# Patient Record
Sex: Female | Born: 1950
Health system: Southern US, Community
[De-identification: ages and names within clinical notes are randomized; demographics above are authoritative.]

## PROBLEM LIST (undated history)

## (undated) DIAGNOSIS — F209 Schizophrenia, unspecified: Secondary | ICD-10-CM

## (undated) DIAGNOSIS — Z992 Dependence on renal dialysis: Secondary | ICD-10-CM

## (undated) DIAGNOSIS — N186 End stage renal disease: Secondary | ICD-10-CM

## (undated) DIAGNOSIS — F419 Anxiety disorder, unspecified: Secondary | ICD-10-CM

## (undated) DIAGNOSIS — G629 Polyneuropathy, unspecified: Secondary | ICD-10-CM

## (undated) DIAGNOSIS — F319 Bipolar disorder, unspecified: Secondary | ICD-10-CM

## (undated) HISTORY — PX: CHOLECYSTECTOMY: SHX55

## (undated) HISTORY — PX: HEMICOLECTOMY: SHX854

## (undated) HISTORY — PX: TUBAL LIGATION: SHX77

## (undated) HISTORY — DX: Anxiety disorder, unspecified: F41.9

## (undated) HISTORY — PX: OTHER SURGICAL HISTORY: SHX169

---

## 2010-05-11 ENCOUNTER — Other Ambulatory Visit: Admission: RE | Admit: 2010-05-11 | Discharge: 2010-05-11 | Payer: Self-pay | Admitting: Obstetrics & Gynecology

## 2013-10-22 ENCOUNTER — Encounter: Payer: Self-pay | Admitting: *Deleted

## 2013-10-22 ENCOUNTER — Other Ambulatory Visit: Payer: Self-pay | Admitting: Obstetrics & Gynecology

## 2013-11-09 ENCOUNTER — Other Ambulatory Visit: Payer: Self-pay | Admitting: Obstetrics & Gynecology

## 2013-11-23 ENCOUNTER — Encounter: Payer: Self-pay | Admitting: Obstetrics & Gynecology

## 2013-11-23 ENCOUNTER — Encounter (INDEPENDENT_AMBULATORY_CARE_PROVIDER_SITE_OTHER): Payer: Self-pay

## 2013-11-23 ENCOUNTER — Other Ambulatory Visit (HOSPITAL_COMMUNITY)
Admission: RE | Admit: 2013-11-23 | Discharge: 2013-11-23 | Disposition: A | Discharge status: Home / Self Care | Payer: Medicaid Other | Source: Ambulatory Visit | Attending: Obstetrics & Gynecology | Admitting: Obstetrics & Gynecology

## 2013-11-23 ENCOUNTER — Ambulatory Visit (INDEPENDENT_AMBULATORY_CARE_PROVIDER_SITE_OTHER): Payer: Medicaid Other | Admitting: Obstetrics & Gynecology

## 2013-11-23 VITALS — BP 120/60 | Ht 64.0 in | Wt 143.0 lb

## 2013-11-23 VITALS — BP 100/60 | Ht 64.0 in | Wt 143.0 lb

## 2013-11-23 DIAGNOSIS — Z01419 Encounter for gynecological examination (general) (routine) without abnormal findings: Secondary | ICD-10-CM | POA: Insufficient documentation

## 2013-11-23 DIAGNOSIS — N879 Dysplasia of cervix uteri, unspecified: Secondary | ICD-10-CM

## 2013-11-23 DIAGNOSIS — Z0142 Encounter for cervical smear to confirm findings of recent normal smear following initial abnormal smear: Secondary | ICD-10-CM

## 2013-11-23 DIAGNOSIS — Z1151 Encounter for screening for human papillomavirus (HPV): Secondary | ICD-10-CM | POA: Insufficient documentation

## 2013-11-23 NOTE — Progress Notes (Signed)
Patient ID: Madison [name], female   DOB: [birth year], 63 y.o.   MRN: [id number] Pt seen at [facility] in 05/2013 with Pap reveal ASCUS  HPV not done or at least no result found in her records sent  Pap with HPV done today  Follow up based on that  Past Medical History  Diagnosis Date  . Anxiety     Past Surgical History  Procedure Laterality Date  . Cholecystectomy      OB History   Grav Para Term Preterm Abortions TAB SAB Ect Mult Living                  Not on File  History   Social History  . Marital Status: Unknown    Spouse Name: N/A    Number of Children: N/A  . Years of Education: N/A   Social History Main Topics  . Smoking status: Never Smoker   . Smokeless tobacco: Not on file  . Alcohol Use: Not on file  . Drug Use: Not on file  . Sexual Activity: Not on file   Other Topics Concern  . Not on file   Social History Narrative  . No narrative on file    No family history on file.

## 2013-11-23 NOTE — Addendum Note (Signed)
Addended by: Doyne Keel on: 11/23/2013 12:17 PM   Modules accepted: Orders

## 2013-11-29 NOTE — Progress Notes (Signed)
Patient ID: Madison [name], female   DOB: 06-01-1951, 63 y.o.   MRN: [id number] Pt had ASCUS pa without a HPV evaluation done today   folow up based on that  Past Medical History  Diagnosis Date  . Anxiety     Past Surgical History  Procedure Laterality Date  . Cholecystectomy      OB History   Grav Para Term Preterm Abortions TAB SAB Ect Mult Living                  Not on File  History   Social History  . Marital Status: Unknown    Spouse Name: N/A    Number of Children: N/A  . Years of Education: N/A   Social History Main Topics  . Smoking status: Never Smoker   . Smokeless tobacco: None  . Alcohol Use: None  . Drug Use: None  . Sexual Activity: None   Other Topics Concern  . None   Social History Narrative  . None    History reviewed. No pertinent family history.

## 2013-12-01 ENCOUNTER — Telehealth: Payer: Self-pay | Admitting: Obstetrics & Gynecology

## 2013-12-01 MED ORDER — METRONIDAZOLE 500 MG PO TABS: ORAL_TABLET | ORAL | Status: DC

## 2013-12-01 NOTE — Telephone Encounter (Signed)
Trich on pap, treat with flagyl 2 grams  Recheck in 3 weeks

## 2013-12-01 NOTE — Telephone Encounter (Signed)
I spoke with Lemma, pt's daughter but did not give any test results because no one was listed on HIPPA  Form. Daughter states that pt has Alzheimer's and will not understand what I am telling her. I spoke with Dr. Elonda Husky and he suggested scheduling an appt to discuss test results. Call transferred to front desk to schedule an appt. [place]

## 2013-12-04 ENCOUNTER — Ambulatory Visit: Payer: Medicaid Other | Admitting: Obstetrics & Gynecology

## 2014-12-28 DIAGNOSIS — F259 Schizoaffective disorder, unspecified: Secondary | ICD-10-CM | POA: Diagnosis not present

## 2015-01-24 DIAGNOSIS — F259 Schizoaffective disorder, unspecified: Secondary | ICD-10-CM | POA: Diagnosis not present

## 2015-02-18 DIAGNOSIS — N183 Chronic kidney disease, stage 3 (moderate): Secondary | ICD-10-CM | POA: Diagnosis not present

## 2015-02-18 DIAGNOSIS — Z0000 Encounter for general adult medical examination without abnormal findings: Secondary | ICD-10-CM | POA: Diagnosis not present

## 2015-02-18 DIAGNOSIS — F319 Bipolar disorder, unspecified: Secondary | ICD-10-CM | POA: Diagnosis not present

## 2015-02-22 DIAGNOSIS — F259 Schizoaffective disorder, unspecified: Secondary | ICD-10-CM | POA: Diagnosis not present

## 2015-02-24 DIAGNOSIS — Z780 Asymptomatic menopausal state: Secondary | ICD-10-CM | POA: Diagnosis not present

## 2015-02-24 DIAGNOSIS — Z1231 Encounter for screening mammogram for malignant neoplasm of breast: Secondary | ICD-10-CM | POA: Diagnosis not present

## 2015-03-07 DIAGNOSIS — N184 Chronic kidney disease, stage 4 (severe): Secondary | ICD-10-CM | POA: Diagnosis not present

## 2015-03-07 DIAGNOSIS — N179 Acute kidney failure, unspecified: Secondary | ICD-10-CM | POA: Diagnosis not present

## 2015-03-07 DIAGNOSIS — R809 Proteinuria, unspecified: Secondary | ICD-10-CM | POA: Diagnosis not present

## 2015-03-07 DIAGNOSIS — N183 Chronic kidney disease, stage 3 (moderate): Secondary | ICD-10-CM | POA: Diagnosis not present

## 2015-03-07 DIAGNOSIS — N251 Nephrogenic diabetes insipidus: Secondary | ICD-10-CM | POA: Diagnosis not present

## 2015-03-09 DIAGNOSIS — R928 Other abnormal and inconclusive findings on diagnostic imaging of breast: Secondary | ICD-10-CM | POA: Diagnosis not present

## 2015-03-09 DIAGNOSIS — R922 Inconclusive mammogram: Secondary | ICD-10-CM | POA: Diagnosis not present

## 2015-03-15 DIAGNOSIS — F259 Schizoaffective disorder, unspecified: Secondary | ICD-10-CM | POA: Diagnosis not present

## 2015-04-04 DIAGNOSIS — E559 Vitamin D deficiency, unspecified: Secondary | ICD-10-CM | POA: Diagnosis not present

## 2015-04-04 DIAGNOSIS — S80862A Insect bite (nonvenomous), left lower leg, initial encounter: Secondary | ICD-10-CM | POA: Diagnosis not present

## 2015-04-04 DIAGNOSIS — S80869A Insect bite (nonvenomous), unspecified lower leg, initial encounter: Secondary | ICD-10-CM | POA: Diagnosis not present

## 2015-04-04 DIAGNOSIS — W57XXXA Bitten or stung by nonvenomous insect and other nonvenomous arthropods, initial encounter: Secondary | ICD-10-CM | POA: Diagnosis not present

## 2015-04-04 DIAGNOSIS — N179 Acute kidney failure, unspecified: Secondary | ICD-10-CM | POA: Diagnosis not present

## 2015-04-04 DIAGNOSIS — N251 Nephrogenic diabetes insipidus: Secondary | ICD-10-CM | POA: Diagnosis not present

## 2015-04-04 DIAGNOSIS — N183 Chronic kidney disease, stage 3 (moderate): Secondary | ICD-10-CM | POA: Diagnosis not present

## 2015-04-12 DIAGNOSIS — F259 Schizoaffective disorder, unspecified: Secondary | ICD-10-CM | POA: Diagnosis not present

## 2015-04-18 DIAGNOSIS — M159 Polyosteoarthritis, unspecified: Secondary | ICD-10-CM | POA: Diagnosis not present

## 2015-04-19 ENCOUNTER — Emergency Department: Payer: Medicare Other

## 2015-04-19 ENCOUNTER — Inpatient Hospital Stay
Admission: EM | Admit: 2015-04-19 | Discharge: 2015-04-21 | DRG: 690 | Disposition: A | Discharge status: Home / Self Care | Payer: Medicare Other | Attending: Internal Medicine | Admitting: Internal Medicine

## 2015-04-19 ENCOUNTER — Encounter: Payer: Self-pay | Admitting: *Deleted

## 2015-04-19 ENCOUNTER — Inpatient Hospital Stay: Payer: Medicare Other

## 2015-04-19 DIAGNOSIS — R8290 Unspecified abnormal findings in urine: Secondary | ICD-10-CM | POA: Diagnosis not present

## 2015-04-19 DIAGNOSIS — R4182 Altered mental status, unspecified: Secondary | ICD-10-CM | POA: Diagnosis not present

## 2015-04-19 DIAGNOSIS — N390 Urinary tract infection, site not specified: Secondary | ICD-10-CM | POA: Diagnosis not present

## 2015-04-19 DIAGNOSIS — Z9049 Acquired absence of other specified parts of digestive tract: Secondary | ICD-10-CM | POA: Diagnosis not present

## 2015-04-19 DIAGNOSIS — R531 Weakness: Secondary | ICD-10-CM | POA: Diagnosis not present

## 2015-04-19 DIAGNOSIS — R631 Polydipsia: Secondary | ICD-10-CM | POA: Diagnosis present

## 2015-04-19 DIAGNOSIS — E860 Dehydration: Secondary | ICD-10-CM | POA: Diagnosis not present

## 2015-04-19 DIAGNOSIS — Z79899 Other long term (current) drug therapy: Secondary | ICD-10-CM

## 2015-04-19 DIAGNOSIS — R410 Disorientation, unspecified: Secondary | ICD-10-CM | POA: Diagnosis not present

## 2015-04-19 DIAGNOSIS — E871 Hypo-osmolality and hyponatremia: Secondary | ICD-10-CM | POA: Diagnosis present

## 2015-04-19 DIAGNOSIS — N184 Chronic kidney disease, stage 4 (severe): Secondary | ICD-10-CM | POA: Diagnosis present

## 2015-04-19 DIAGNOSIS — I129 Hypertensive chronic kidney disease with stage 1 through stage 4 chronic kidney disease, or unspecified chronic kidney disease: Secondary | ICD-10-CM | POA: Diagnosis present

## 2015-04-19 DIAGNOSIS — I482 Chronic atrial fibrillation: Secondary | ICD-10-CM | POA: Diagnosis not present

## 2015-04-19 DIAGNOSIS — F419 Anxiety disorder, unspecified: Secondary | ICD-10-CM | POA: Diagnosis present

## 2015-04-19 DIAGNOSIS — F209 Schizophrenia, unspecified: Secondary | ICD-10-CM | POA: Diagnosis present

## 2015-04-19 LAB — COMPREHENSIVE METABOLIC PANEL
ALK PHOS: 56 U/L (ref 38–126)
ALT: 15 U/L (ref 14–54)
ANION GAP: 11 (ref 5–15)
AST: 36 U/L (ref 15–41)
Albumin: 3.6 g/dL (ref 3.5–5.0)
BUN: 24 mg/dL — ABNORMAL HIGH (ref 6–20)
CALCIUM: 8.9 mg/dL (ref 8.9–10.3)
CO2: 22 mmol/L (ref 22–32)
CREATININE: 2.13 mg/dL — AB (ref 0.44–1.00)
Chloride: 81 mmol/L — ABNORMAL LOW (ref 101–111)
GFR calc non Af Amer: 23 mL/min — ABNORMAL LOW (ref >60)
GFR, EST AFRICAN AMERICAN: 27 mL/min — AB (ref >60)
GLUCOSE: 111 mg/dL — AB (ref 65–99)
Potassium: 3.8 mmol/L (ref 3.5–5.1)
Sodium: 114 mmol/L — CL (ref 135–145)
TOTAL PROTEIN: 6.9 g/dL (ref 6.5–8.1)
Total Bilirubin: 0.5 mg/dL (ref 0.3–1.2)

## 2015-04-19 LAB — URINALYSIS COMPLETE WITH MICROSCOPIC (ARMC ONLY)
Bilirubin Urine: NEGATIVE
GLUCOSE, UA: NEGATIVE mg/dL
KETONES UR: NEGATIVE mg/dL
Nitrite: NEGATIVE
Protein, ur: 100 mg/dL — AB
Specific Gravity, Urine: 1.002 — ABNORMAL LOW (ref 1.005–1.030)
pH: 6 (ref 5.0–8.0)

## 2015-04-19 LAB — CBC
HCT: 28.1 % — ABNORMAL LOW (ref 35.0–47.0)
Hemoglobin: 9.9 g/dL — ABNORMAL LOW (ref 12.0–16.0)
MCH: 29.9 pg (ref 26.0–34.0)
MCHC: 35.4 g/dL (ref 32.0–36.0)
MCV: 84.6 fL (ref 80.0–100.0)
Platelets: 200 10*3/uL (ref 150–440)
RBC: 3.32 MIL/uL — ABNORMAL LOW (ref 3.80–5.20)
RDW: 13.5 % (ref 11.5–14.5)
WBC: 5.6 10*3/uL (ref 3.6–11.0)

## 2015-04-19 MED ORDER — GABAPENTIN 100 MG PO CAPS
100.0000 mg | ORAL_CAPSULE | Freq: Three times a day (TID) | ORAL | Status: DC
  Administered 2015-04-20 – 2015-04-21 (×5): 100 mg via ORAL
  Filled 2015-04-19 (×5): qty 1

## 2015-04-19 MED ORDER — OXYCODONE HCL 5 MG PO TABS: 5.0000 mg | ORAL_TABLET | ORAL | Status: DC | PRN

## 2015-04-19 MED ORDER — DOCUSATE SODIUM 100 MG PO CAPS
100.0000 mg | ORAL_CAPSULE | Freq: Two times a day (BID) | ORAL | Status: DC
  Administered 2015-04-20 – 2015-04-21 (×3): 100 mg via ORAL
  Filled 2015-04-19 (×3): qty 1

## 2015-04-19 MED ORDER — SODIUM CHLORIDE 0.9 % IV BOLUS (SEPSIS)
1000.0000 mL | Freq: Once | INTRAVENOUS | Status: AC
  Administered 2015-04-19: 1000 mL via INTRAVENOUS

## 2015-04-19 MED ORDER — ONDANSETRON HCL 4 MG/2ML IJ SOLN: 4.0000 mg | Freq: Four times a day (QID) | INTRAMUSCULAR | Status: DC | PRN

## 2015-04-19 MED ORDER — PANTOPRAZOLE SODIUM 40 MG PO TBEC
40.0000 mg | DELAYED_RELEASE_TABLET | Freq: Every day | ORAL | Status: DC
  Administered 2015-04-20 – 2015-04-21 (×2): 40 mg via ORAL
  Filled 2015-04-19 (×2): qty 1

## 2015-04-19 MED ORDER — DEXTROSE 5 % IV SOLN
1.0000 g | INTRAVENOUS | Status: DC
  Administered 2015-04-20 (×2): 1 g via INTRAVENOUS
  Filled 2015-04-19 (×3): qty 10

## 2015-04-19 MED ORDER — SODIUM CHLORIDE 0.9 % IJ SOLN
3.0000 mL | Freq: Two times a day (BID) | INTRAMUSCULAR | Status: DC
  Administered 2015-04-19 – 2015-04-21 (×3): 3 mL via INTRAVENOUS

## 2015-04-19 MED ORDER — VITAMIN B-1 100 MG PO TABS
50.0000 mg | ORAL_TABLET | Freq: Every day | ORAL | Status: DC
  Administered 2015-04-20 – 2015-04-21 (×2): 50 mg via ORAL
  Filled 2015-04-19 (×2): qty 1

## 2015-04-19 MED ORDER — ACETAMINOPHEN 650 MG RE SUPP: 650.0000 mg | Freq: Four times a day (QID) | RECTAL | Status: DC | PRN

## 2015-04-19 MED ORDER — CLONAZEPAM 0.5 MG PO TABS: 0.5000 mg | ORAL_TABLET | Freq: Two times a day (BID) | ORAL | Status: DC | PRN

## 2015-04-19 MED ORDER — ACETAMINOPHEN 325 MG PO TABS: 650.0000 mg | ORAL_TABLET | Freq: Four times a day (QID) | ORAL | Status: DC | PRN

## 2015-04-19 MED ORDER — SODIUM CHLORIDE 0.9 % IV SOLN
INTRAVENOUS | Status: DC
  Administered 2015-04-20: via INTRAVENOUS

## 2015-04-19 MED ORDER — OLANZAPINE 5 MG PO TABS
15.0000 mg | ORAL_TABLET | Freq: Every day | ORAL | Status: DC
  Administered 2015-04-20 (×2): 15 mg via ORAL
  Filled 2015-04-19: qty 2
  Filled 2015-04-19: qty 3

## 2015-04-19 MED ORDER — HEPARIN SODIUM (PORCINE) 5000 UNIT/ML IJ SOLN
5000.0000 [IU] | Freq: Three times a day (TID) | INTRAMUSCULAR | Status: DC
  Administered 2015-04-20 – 2015-04-21 (×4): 5000 [IU] via SUBCUTANEOUS
  Filled 2015-04-19 (×4): qty 1

## 2015-04-19 MED ORDER — ONDANSETRON HCL 4 MG PO TABS
4.0000 mg | ORAL_TABLET | Freq: Four times a day (QID) | ORAL | Status: DC | PRN
Start: 2015-04-19 — End: 2015-04-21

## 2015-04-19 NOTE — ED Provider Notes (Signed)
[facility] Emergency Department Provider Note  ____________________________________________  Time seen: Approximately 8:11 PM  I have reviewed the triage vital signs and the nursing notes.   HISTORY  Chief Complaint Altered Mental Status    HPI Michaeline Lambright is a 64 y.o. female with schizophrenia, chronic kidney disease, whopresents for evaluation of transient confusion last night, gradual onset. According to patient and family at bedside, when they went to visit the patient, who lives independently, she was confused about where her medications were and did not know whether or not she had taken them. Today, she has not seemed terribly confused but did request to be seen in the emergency department which she usually does not do. She denies any chest pain, difficulty breathing, nausea, vomiting, diarrhea, fevers or chills. No recent falls. Current severity of symptoms is mild. No modifying factors. No headache, no vision change. She reports that her nephrologist told her to "drink a lot of water" and she has been drinking 6 x 2 L bottles of water daily.   Past Medical History  Diagnosis Date  . Anxiety     There are no active problems to display for this patient.   Past Surgical History  Procedure Laterality Date  . Cholecystectomy      Current Outpatient Rx  Name  Route  Sig  Dispense  Refill  . clonazePAM (KLONOPIN) 0.5 MG tablet   Oral   Take 0.5 mg by mouth 2 (two) times daily as needed for anxiety.         . gabapentin (NEURONTIN) 100 MG capsule   Oral   Take 100 mg by mouth 3 (three) times daily.         . Multiple Vitamin (MULTIVITAMIN) tablet   Oral   Take 1 tablet by mouth daily.         Marland Kitchen OLANZapine (ZYPREXA) 10 MG tablet   Oral   Take 15 mg by mouth at bedtime.         . thiamine (VITAMIN B-1) 50 MG tablet   Oral   Take 50 mg by mouth daily.           Allergies Review of patient's allergies indicates no known  allergies.  No family history on file.  Social History History  Substance Use Topics  . Smoking status: Never Smoker   . Smokeless tobacco: Not on file  . Alcohol Use: No    Review of Systems Constitutional: No fever/chills Eyes: No visual changes. ENT: No sore throat. Cardiovascular: Denies chest pain. Respiratory: Denies shortness of breath. Gastrointestinal: No abdominal pain.  No nausea, no vomiting.  No diarrhea.  No constipation. Genitourinary: Negative for dysuria. Musculoskeletal: Negative for back pain. Skin: Negative for rash. Neurological: Negative for headaches, focal weakness or numbness.  10-point ROS otherwise negative.  ____________________________________________   PHYSICAL EXAM:  VITAL SIGNS: ED Triage Vitals  Enc Vitals Group     BP 04/19/15 1930 165/76 mmHg     Pulse Rate 04/19/15 1930 87     Resp 04/19/15 1930 20     Temp 04/19/15 1930 99.6 F (37.6 C)     Temp Source 04/19/15 1930 Oral     SpO2 04/19/15 1930 97 %     Weight 04/19/15 1930 130 lb (58.968 kg)     Height 04/19/15 1930 5\' 4"  (1.626 m)     Head Cir --      Peak Flow --      Pain Score --  Pain Loc --      Pain Edu? --      Excl. in [place]? --     Constitutional: Alert and oriented x 4. Well appearing and in no acute distress. Eyes: Conjunctivae are normal. PERRL. EOMI. Head: Atraumatic. Nose: No congestion/rhinnorhea. Mouth/Throat: Mucous membranes are moist.  Oropharynx non-erythematous. Neck: No stridor.   Cardiovascular: Normal rate, regular rhythm. Grossly normal heart sounds.  Good peripheral circulation. Respiratory: Normal respiratory effort.  No retractions. Lungs CTAB. Gastrointestinal: Soft and nontender. No distention. No abdominal bruits. No CVA tenderness. Genitourinary: deferred Musculoskeletal: No lower extremity tenderness nor edema.  No joint effusions. Neurologic:  Normal speech and language. No gross focal neurologic deficits are appreciated. No gait  instability. 5 out of 5 strength in bilateral upper and lower extremities, sensation intact to light touch throughout, cranial nerves II through XII intact, normal finger-nose-finger. Skin:  Skin is warm, dry and intact. No rash noted. Psychiatric: Mood and affect are normal. Speech and behavior are normal.  ____________________________________________   LABS (all labs ordered are listed, but only abnormal results are displayed)  Labs Reviewed  COMPREHENSIVE METABOLIC PANEL - Abnormal; Notable for the following:    Sodium 114 (*)    Chloride 81 (*)    Glucose, Bld 111 (*)    BUN 24 (*)    Creatinine, Ser 2.13 (*)    GFR calc non Af Amer 23 (*)    GFR calc Af Amer 27 (*)    All other components within normal limits  CBC - Abnormal; Notable for the following:    RBC 3.32 (*)    Hemoglobin 9.9 (*)    HCT 28.1 (*)    All other components within normal limits  URINALYSIS COMPLETEWITH MICROSCOPIC ([facility] ONLY) - Abnormal; Notable for the following:    Color, Urine STRAW (*)    APPearance HAZY (*)    Specific Gravity, Urine 1.002 (*)    Hgb urine dipstick 2+ (*)    Protein, ur 100 (*)    Leukocytes, UA 1+ (*)    Bacteria, UA FEW (*)    Squamous Epithelial / LPF 0-5 (*)    All other components within normal limits  URINALYSIS COMPLETEWITH MICROSCOPIC ([facility] ONLY)  TROPONIN I   ____________________________________________  EKG  ED ECG REPORT I, Joanne Gavel, the attending physician, personally viewed and interpreted this ECG.   Date: 04/19/2015  EKG Time: 20:41  Rate: 80  Rhythm: sinus rhythm  Axis: normal  Intervals:first-degree A-V block   ST&T Change: No acute ST segment elevation, minimal voltage criteria for LVH  ____________________________________________  RADIOLOGY  CXR FINDINGS: The lungs are clear. Heart size is normal. There is no pneumothorax or pleural effusion. No focal bony abnormality.  IMPRESSION: Negative  chest.   ____________________________________________   PROCEDURES  Procedure(s) performed: None  Critical Care performed: Yes, see critical care note(s). Total critical care time spent 30 minutes.  ____________________________________________   INITIAL IMPRESSION / ASSESSMENT AND PLAN / ED COURSE  Pertinent labs & imaging results that were available during my care of the patient were reviewed by me and considered in my medical decision making (see chart for details).  Jameeka Deems is a 64 y.o. female with schizophrenia, chronic kidney disease, whopresents for evaluation of transient confusion last night, gradual onset. On exam, she is well-appearing and in no acute distress. Vital signs stable, she is afebrile. She has an intact neurological examination. No indication for head CT at this time. Labs notable for severe  hyponatremia with sodium of 114 possibly secondary to psychogenic polydipsia- we'll give normal saline. No seizure, she is not currently altered, no indication for hypertonic saline at this time. Additionally, creatinine elevated at 2.13. She is mildly anemic with hemoglobin 9.9 but is hemodynamically stable. Urinalysis is not consistent with infection. Chest x-ray clear. Discussed with hospitalist for admission. ____________________________________________   FINAL CLINICAL IMPRESSION(S) / ED DIAGNOSES  Final diagnoses:  Confusion  Hyponatremia      Joanne Gavel, MD 04/19/15 2134

## 2015-04-19 NOTE — ED Notes (Signed)
Lab reports pt Na 114; charge nurse notified and pt taken to room 19 via w/c

## 2015-04-19 NOTE — H&P (Signed)
[place] [facility] [place] NAME: Madison [name]    MR#:  [id number]  DATE OF BIRTH:  [birth year]  DATE OF ADMISSION:  04/19/2015  PRIMARY CARE PHYSICIAN: No PCP Per Patient   REQUESTING/REFERRING PHYSICIAN: Dr. Edd Fabian  CHIEF COMPLAINT:  Altered mental status  HISTORY OF PRESENT ILLNESS:  Madison [name]  is a 64 y.o. female with a known history of schizophrenia, chronic kidney disease stage IV, chronic atrial fibrillation, hypertension and anxiety is brought into the ED for altered mental status by her sisters. Patient's sister start reporting that she is not being herself and pleasantly confused since yesterday. She got mixed up and not taking her medications properly. She reported to the ED physician that her nephrologist told her to "drink a lot of water" and she has been drinking 6 x 2 L bottles of water daily. Patient was reporting that she was nauseous and vomited 3-4 times since last night. Denies any abdominal pain. Denies any fever or diarrhea. Feeling tired and very slowly responding to questions  PAST MEDICAL HISTORY:   Past Medical History  Diagnosis Date  . Anxiety    schizophrenia Chronic kidney disease stage IV Hypertension  PAST SURGICAL HISTOIRY:   Past Surgical History  Procedure Laterality Date  . Cholecystectomy      SOCIAL HISTORY:   History  Substance Use Topics  . Smoking status: Never Smoker   . Smokeless tobacco: Not on file  . Alcohol Use: No    FAMILY HISTORY:  No family history on file.  DRUG ALLERGIES:  No Known Allergies  REVIEW OF SYSTEMS:  CONSTITUTIONAL: No fever, reporting fatigue and weakness.  EYES: No blurred or double vision.  EARS, NOSE, AND THROAT: No tinnitus or ear pain.  RESPIRATORY: No cough, shortness of breath, wheezing or hemoptysis.  CARDIOVASCULAR: No chest pain, orthopnea, edema.  GASTROINTESTINAL: Admits nausea, vomiting, but no diarrhea or abdominal pain.  GENITOURINARY:  No dysuria, hematuria.  ENDOCRINE: Reports polyuria but no nocturia,  HEMATOLOGY: No anemia, easy bruising or bleeding SKIN: No rash or lesion. MUSCULOSKELETAL: No joint pain or arthritis.   NEUROLOGIC: No tingling, numbness, weakness.  PSYCHIATRY: Has history of anxiety and schizophrenia . Denies depression.   MEDICATIONS AT HOME:   Prior to Admission medications   Medication Sig Start Date End Date Taking? Authorizing Provider  clonazePAM (KLONOPIN) 0.5 MG tablet Take 0.5 mg by mouth 2 (two) times daily as needed for anxiety.   Yes Historical Provider, MD  gabapentin (NEURONTIN) 100 MG capsule Take 100 mg by mouth 3 (three) times daily.   Yes Historical Provider, MD  Multiple Vitamin (MULTIVITAMIN) tablet Take 1 tablet by mouth daily.   Yes Historical Provider, MD  OLANZapine (ZYPREXA) 10 MG tablet Take 15 mg by mouth at bedtime.   Yes Historical Provider, MD  thiamine (VITAMIN B-1) 50 MG tablet Take 50 mg by mouth daily.   Yes Historical Provider, MD      VITAL SIGNS:  Blood pressure 151/70, pulse 87, temperature 98.5 F (36.9 C), temperature source Oral, resp. rate 16, height 5\' 4"  (1.626 m), weight 55.7 kg (122 lb 12.7 oz), SpO2 100 %.  PHYSICAL EXAMINATION:  GENERAL:  64 y.o.-year-old patient lying in the bed with no acute distress.  EYES: Pupils equal, round, reactive to light and accommodation. No scleral icterus. Extraocular muscles intact.  HEENT: Head atraumatic, normocephalic. Oropharynx and nasopharynx clear.  NECK:  Supple, no jugular venous distention. No thyroid enlargement, no tenderness.  LUNGS: Normal breath sounds bilaterally, no wheezing, rales,rhonchi or crepitation. No use of accessory muscles of respiration.  CARDIOVASCULAR: S1, S2 normal. No murmurs, rubs, or gallops.  ABDOMEN: Soft, nontender, nondistended. Bowel sounds present. No organomegaly or mass.  EXTREMITIES: No pedal edema, cyanosis, or clubbing.  NEUROLOGIC: Cranial nerves II through XII are  intact. Muscle strength 5/5 in all extremities. Sensation intact. Gait not checked. Slow response to verbal commands but following verbal commands accurately PSYCHIATRIC: The patient is alert and oriented x 3.  SKIN: No obvious rash, lesion, or ulcer.   LABORATORY PANEL:   CBC  Recent Labs Lab 04/19/15 1938  WBC 5.6  HGB 9.9*  HCT 28.1*  PLT 200   ------------------------------------------------------------------------------------------------------------------  Chemistries   Recent Labs Lab 04/19/15 1938  NA 114*  K 3.8  CL 81*  CO2 22  GLUCOSE 111*  BUN 24*  CREATININE 2.13*  CALCIUM 8.9  AST 36  ALT 15  ALKPHOS 56  BILITOT 0.5   ------------------------------------------------------------------------------------------------------------------  Cardiac Enzymes No results for input(s): TROPONINI in the last 168 hours. ------------------------------------------------------------------------------------------------------------------  RADIOLOGY:  Dg Chest Portable 1 View  04/19/2015   CLINICAL DATA:  Altered mental status.  Weakness.  EXAM: PORTABLE CHEST - 1 VIEW  COMPARISON:  None.  FINDINGS: The lungs are clear. Heart size is normal. There is no pneumothorax or pleural effusion. No focal bony abnormality.  IMPRESSION: Negative chest.   Electronically Signed   By: Inge Rise M.D.   On: 04/19/2015 20:51    EKG:   Orders placed or performed during the hospital encounter of 04/19/15  . ED EKG  . ED EKG    IMPRESSION AND PLAN:   Madison [name]  is a 64 y.o. female with a known history of schizophrenia, chronic kidney disease stage IV, chronic atrial fibrillation, hypertension and anxiety is brought into the ED for altered mental status by her sisters. Patient's sister start reporting that she is not being herself and pleasantly confused since yesterday. She got mixed up and not taking her medications properly. She reported to the ED physician that her  nephrologist told her to "drink a lot of water" and she has been drinking 6 x 2 L bottles of water daily. Patient was reporting that she was nauseous and vomited 3-4 times since last night.    1. Altered mental status from severe hyponatremia 1 L fluid bolus was given in the ED CT head is ordered which is pending Will get neuro checks  2. Severe hypervolemic hyponatremia secondary to psychogenic polydipsia with a complaint of dehydration from nausea and vomiting for the past 2 days  1 L normal saline fluid bolus was given in the ED, will continue normal saline at 60 cc/h -Check serial sodiums every 4 hours and stop IV fluids once her sodium reaches to 122-to reach a target of 8 mEq per hour in 24 hours -Check fasting lipid panel, TSH -Patient takes psych medications for schizophrenia and anxiety which could cause SIADH, but we don't know her baseline sodium -Nephrology consult is placed -Fluid restriction -Neuro checks -Random urine sodium, creatinine and urine osmolality -BMP and CBC in a.m.  3. Abnormal urinalysis and rule out UTI Urine culture and sensitivity is ordered Empiric IV antibiotic Rocephin  4. Chronic kidney disease stage IV Nephrology consult is placed, outpatient follow-up with nephrology as recommended by them  5. Chronic history of atrial fibrillation Rate controlled. Not on any blood thinners.  6. Chronic Hypertension, blood pressure is elevated Not on  any home medications Start her on small dose Lopressor and titrate as needed  Provide GI prophylaxis with Protonix and DVT per flexes with heparin subcutaneous All the records are reviewed and case discussed with ED provider. Management plans discussed with the patient, family and they are in agreement.  CODE STATUS: Full code, sister is the healthcare power of attorney  TOTAL CRITICAL CARE TIME TAKING CARE OF THIS PATIENT: Reviewing medical records, history and physical, admission orders, coordination of care  -50 minutes.    Nicholes Mango M.D on 04/19/2015 at 11:55 PM  Between 7am to 6pm - Pager - [phone]  After 6pm go to [email or web address] - password [email or web address] [facility]  [facility]  Office  [phone]  CC: Primary care physician; No PCP Per Patient

## 2015-04-19 NOTE — ED Notes (Addendum)
Pt ambulatory to triage.  Sister states pt is more confused than usual.  Sx began last night . Pt is schizophrenic per sister.  Pt lives alone.  sister states unsure if pt is taking her medicines.  Pt alert.   Pt denies SI or HI.  Pt denies headache or any pain at this time.

## 2015-04-19 NOTE — ED Notes (Signed)
Patient transported to CT 

## 2015-04-20 ENCOUNTER — Inpatient Hospital Stay: Payer: Medicare Other

## 2015-04-20 DIAGNOSIS — N184 Chronic kidney disease, stage 4 (severe): Secondary | ICD-10-CM | POA: Diagnosis not present

## 2015-04-20 DIAGNOSIS — I482 Chronic atrial fibrillation: Secondary | ICD-10-CM | POA: Diagnosis not present

## 2015-04-20 DIAGNOSIS — R4182 Altered mental status, unspecified: Secondary | ICD-10-CM | POA: Diagnosis not present

## 2015-04-20 DIAGNOSIS — R410 Disorientation, unspecified: Secondary | ICD-10-CM | POA: Diagnosis not present

## 2015-04-20 DIAGNOSIS — F419 Anxiety disorder, unspecified: Secondary | ICD-10-CM | POA: Diagnosis not present

## 2015-04-20 DIAGNOSIS — E871 Hypo-osmolality and hyponatremia: Secondary | ICD-10-CM | POA: Diagnosis not present

## 2015-04-20 DIAGNOSIS — N3000 Acute cystitis without hematuria: Secondary | ICD-10-CM | POA: Diagnosis not present

## 2015-04-20 DIAGNOSIS — I10 Essential (primary) hypertension: Secondary | ICD-10-CM | POA: Diagnosis not present

## 2015-04-20 DIAGNOSIS — N390 Urinary tract infection, site not specified: Secondary | ICD-10-CM | POA: Diagnosis not present

## 2015-04-20 DIAGNOSIS — F209 Schizophrenia, unspecified: Secondary | ICD-10-CM | POA: Diagnosis not present

## 2015-04-20 DIAGNOSIS — E860 Dehydration: Secondary | ICD-10-CM | POA: Diagnosis not present

## 2015-04-20 LAB — URINALYSIS COMPLETE WITH MICROSCOPIC (ARMC ONLY)
BILIRUBIN URINE: NEGATIVE
Glucose, UA: NEGATIVE mg/dL
Ketones, ur: NEGATIVE mg/dL
NITRITE: NEGATIVE
PH: 7 (ref 5.0–8.0)
Protein, ur: 100 mg/dL — AB
Specific Gravity, Urine: 1.001 — ABNORMAL LOW (ref 1.005–1.030)
Squamous Epithelial / LPF: NONE SEEN

## 2015-04-20 LAB — COMPREHENSIVE METABOLIC PANEL
ALK PHOS: 56 U/L (ref 38–126)
ALT: 14 U/L (ref 14–54)
AST: 33 U/L (ref 15–41)
Albumin: 3.1 g/dL — ABNORMAL LOW (ref 3.5–5.0)
Anion gap: 8 (ref 5–15)
BUN: 27 mg/dL — ABNORMAL HIGH (ref 6–20)
CO2: 24 mmol/L (ref 22–32)
Calcium: 9 mg/dL (ref 8.9–10.3)
Chloride: 99 mmol/L — ABNORMAL LOW (ref 101–111)
Creatinine, Ser: 2.15 mg/dL — ABNORMAL HIGH (ref 0.44–1.00)
GFR calc non Af Amer: 23 mL/min — ABNORMAL LOW (ref >60)
GFR, EST AFRICAN AMERICAN: 27 mL/min — AB (ref >60)
GLUCOSE: 94 mg/dL (ref 65–99)
Potassium: 4 mmol/L (ref 3.5–5.1)
Sodium: 131 mmol/L — ABNORMAL LOW (ref 135–145)
Total Bilirubin: 0.3 mg/dL (ref 0.3–1.2)
Total Protein: 6.7 g/dL (ref 6.5–8.1)

## 2015-04-20 LAB — SODIUM
SODIUM: 132 mmol/L — AB (ref 135–145)
Sodium: 126 mmol/L — ABNORMAL LOW (ref 135–145)
Sodium: 131 mmol/L — ABNORMAL LOW (ref 135–145)
Sodium: 137 mmol/L (ref 135–145)
Sodium: 138 mmol/L (ref 135–145)

## 2015-04-20 LAB — OSMOLALITY, URINE: Osmolality, Ur: 70 mOsm/kg — ABNORMAL LOW (ref 300–900)

## 2015-04-20 LAB — SODIUM, URINE, RANDOM: Sodium, Ur: 14 mmol/L

## 2015-04-20 LAB — MRSA PCR SCREENING: MRSA BY PCR: POSITIVE — AB

## 2015-04-20 LAB — TROPONIN I: Troponin I: <0.03 ng/mL (ref <0.031)

## 2015-04-20 LAB — TSH: TSH: 1.46 u[IU]/mL (ref 0.350–4.500)

## 2015-04-20 LAB — CREATININE, URINE, RANDOM: CREATININE, URINE: 10 mg/dL

## 2015-04-20 MED ORDER — METOPROLOL TARTRATE 25 MG PO TABS
25.0000 mg | ORAL_TABLET | Freq: Two times a day (BID) | ORAL | Status: DC
  Administered 2015-04-20 – 2015-04-21 (×3): 25 mg via ORAL
  Filled 2015-04-20 (×4): qty 1

## 2015-04-20 MED ORDER — CHLORHEXIDINE GLUCONATE CLOTH 2 % EX PADS
6.0000 | MEDICATED_PAD | Freq: Every day | CUTANEOUS | Status: DC
Start: 2015-04-20 — End: 2015-04-21

## 2015-04-20 MED ORDER — MUPIROCIN 2 % EX OINT
1.0000 "application " | TOPICAL_OINTMENT | Freq: Two times a day (BID) | CUTANEOUS | Status: DC
  Administered 2015-04-20 (×2): 1 via NASAL
  Filled 2015-04-20 (×2): qty 22

## 2015-04-20 NOTE — Consult Note (Signed)
[facility]  CONSULT NOTE    Date: 04/20/2015                  Patient Name:  Cori Alexie  MRN: [id number]  DOB: 1951-05-08  Age / Sex: 64 y.o., female         PCP: No PCP Per Patient                 Service Requesting Consult: Dr. Margaretmary Eddy                 Reason for Consult: Hyponatremia, Chronic kidney disease stage IV            History of Present Illness: Ms. Adrienna Soelberg is a 64 y.o. black female with schizophrenia, atrial fibrillation, hypertension, anxiety, chronic kidney disease stage IV , who was admitted to [facility] on 04/19/2015 for hyponatremia.  Patient states that she had a nervous breakdown from caregiver fatigue taking care of her mother with dementia. She started to drink a lot of water. Found in ED with sodium of 114. She had not been taking her medications and did not know where they are.  Started on NS and fluid restriction in the ED. Sodium has now improved to 126 to 131. Patient states she is feeling better.  Patient has baseline chronic kidney disease stage IV. She follows with [facility].      Medications: Outpatient medications: Prescriptions prior to admission  Medication Sig Dispense Refill Last Dose  . clonazePAM (KLONOPIN) 0.5 MG tablet Take 0.5 mg by mouth 2 (two) times daily as needed for anxiety.   04/18/2015 at Unknown time  . gabapentin (NEURONTIN) 100 MG capsule Take 100 mg by mouth 3 (three) times daily.   04/18/2015 at Unknown time  . Multiple Vitamin (MULTIVITAMIN) tablet Take 1 tablet by mouth daily.   04/18/2015 at Unknown time  . OLANZapine (ZYPREXA) 10 MG tablet Take 15 mg by mouth at bedtime.   04/18/2015 at Unknown time  . thiamine (VITAMIN B-1) 50 MG tablet Take 50 mg by mouth daily.   04/18/2015 at Unknown time    Current medications: Current Facility-Administered Medications  Medication Dose Route Frequency Provider Last Rate Last Dose  . 0.9 %  sodium chloride infusion   Intravenous Continuous Nicholes Mango,  MD   Stopped at 04/20/15 0330  . acetaminophen (TYLENOL) tablet 650 mg  650 mg Oral Q6H PRN Nicholes Mango, MD       Or  . acetaminophen (TYLENOL) suppository 650 mg  650 mg Rectal Q6H PRN Nicholes Mango, MD      . cefTRIAXone (ROCEPHIN) 1 g in dextrose 5 % 50 mL IVPB  1 g Intravenous Q24H Nicholes Mango, MD   1 g at 04/20/15 0158  . Chlorhexidine Gluconate Cloth 2 % PADS 6 each  6 each Topical Q0600 Aruna Gouru, MD      . clonazePAM (KLONOPIN) tablet 0.5 mg  0.5 mg Oral BID PRN Nicholes Mango, MD      . docusate sodium (COLACE) capsule 100 mg  100 mg Oral BID Nicholes Mango, MD   100 mg at 04/20/15 0028  . gabapentin (NEURONTIN) capsule 100 mg  100 mg Oral TID Nicholes Mango, MD   100 mg at 04/20/15 0027  . heparin injection 5,000 Units  5,000 Units Subcutaneous 3 times per day Nicholes Mango, MD   5,000 Units at 04/20/15 0027  . metoprolol tartrate (LOPRESSOR) tablet 25 mg  25 mg Oral BID Nicholes Mango, MD  25 mg at 04/20/15 0027  . mupirocin ointment (BACTROBAN) 2 % 1 application  1 application Nasal BID Nicholes Mango, MD      . OLANZapine (ZYPREXA) tablet 15 mg  15 mg Oral QHS Nicholes Mango, MD   15 mg at 04/20/15 0028  . ondansetron (ZOFRAN) tablet 4 mg  4 mg Oral Q6H PRN Nicholes Mango, MD       Or  . ondansetron (ZOFRAN) injection 4 mg  4 mg Intravenous Q6H PRN Nicholes Mango, MD      . oxyCODONE (Oxy IR/ROXICODONE) immediate release tablet 5 mg  5 mg Oral Q4H PRN Nicholes Mango, MD      . pantoprazole (PROTONIX) EC tablet 40 mg  40 mg Oral Daily Aruna Gouru, MD      . sodium chloride 0.9 % injection 3 mL  3 mL Intravenous Q12H Nicholes Mango, MD   3 mL at 04/19/15 2345  . thiamine (VITAMIN B-1) tablet 50 mg  50 mg Oral Daily Nicholes Mango, MD          Allergies: No Known Allergies    Past Medical History: Past Medical History  Diagnosis Date  . Anxiety      Past Surgical History: Past Surgical History  Procedure Laterality Date  . Cholecystectomy       Family History: No family history on file.   Social  History: History   Social History  . Marital Status: Single    Spouse Name: N/A  . Number of Children: N/A  . Years of Education: N/A   Occupational History  . Not on file.   Social History Main Topics  . Smoking status: Never Smoker   . Smokeless tobacco: Not on file  . Alcohol Use: No  . Drug Use: Not on file  . Sexual Activity: Not on file   Other Topics Concern  . Not on file   Social History Narrative     Review of Systems: ROS  Vital Signs: Blood pressure 104/62, pulse 61, temperature 97.7 F (36.5 C), temperature source Oral, resp. rate 18, height 5\' 4"  (1.626 m), weight 55.7 kg (122 lb 12.7 oz), SpO2 100 %.  Weight trends: Filed Weights   04/19/15 1930 04/19/15 2332  Weight: 58.968 kg (130 lb) 55.7 kg (122 lb 12.7 oz)    Physical Exam: General: NAD,   Head: Normocephalic, atraumatic. Moist oral mucosal membranes  Eyes: Anicteric, PERRL  Neck: Supple, trachea midline  Lungs:  Clear to auscultation  Heart: Regular rate and rhythm  Abdomen:  Soft, nontender,   Extremities:  no peripheral edema.  Neurologic: Nonfocal, moving all four extremities  Skin: No lesions        Lab results: Basic Metabolic Panel:  Recent Labs Lab 04/19/15 1938 04/20/15 0035 04/20/15 0430  NA 114* 126* 131*  K 3.8  --  4.0  CL 81*  --  99*  CO2 22  --  24  GLUCOSE 111*  --  94  BUN 24*  --  27*  CREATININE 2.13*  --  2.15*  CALCIUM 8.9  --  9.0    Liver Function Tests:  Recent Labs Lab 04/19/15 1938 04/20/15 0430  AST 36 33  ALT 15 14  ALKPHOS 56 56  BILITOT 0.5 0.3  PROT 6.9 6.7  ALBUMIN 3.6 3.1*   No results for input(s): LIPASE, AMYLASE in the last 168 hours. No results for input(s): AMMONIA in the last 168 hours.  CBC:  Recent Labs Lab 04/19/15 1938  WBC 5.6  HGB 9.9*  HCT 28.1*  MCV 84.6  PLT 200    Cardiac Enzymes:  Recent Labs Lab 04/19/15 1938  TROPONINI <0.03    BNP: Invalid input(s): POCBNP  CBG: No results for  input(s): GLUCAP in the last 168 hours.  Microbiology: Results for orders placed or performed during the hospital encounter of 04/19/15  MRSA PCR Screening     Status: Abnormal   Collection Time: 04/20/15 12:13 AM  Result Value Ref Range Status   MRSA by PCR POSITIVE (A) NEGATIVE Final    Comment:        The GeneXpert MRSA Assay (FDA approved for NASAL specimens only), is one component of a comprehensive MRSA colonization surveillance program. It is not intended to diagnose MRSA infection nor to guide or monitor treatment for MRSA infections. CRITICAL RESULT CALLED TO, READ BACK BY AND VERIFIED WITH: CAROL PATEL @ 0142 7.27.16 MPG     Coagulation Studies: No results for input(s): LABPROT, INR in the last 72 hours.  Urinalysis:  Recent Labs  04/19/15 1938 04/20/15 0013  COLORURINE STRAW* COLORLESS*  LABSPEC 1.002* 1.001*  PHURINE 6.0 7.0  GLUCOSEU NEGATIVE NEGATIVE  HGBUR 2+* 1+*  BILIRUBINUR NEGATIVE NEGATIVE  KETONESUR NEGATIVE NEGATIVE  PROTEINUR 100* 100*  NITRITE NEGATIVE NEGATIVE  LEUKOCYTESUR 1+* TRACE*      Imaging: Ct Head Wo Contrast  04/20/2015   CLINICAL DATA:  Altered mental status.  EXAM: CT HEAD WITHOUT CONTRAST  TECHNIQUE: Contiguous axial images were obtained from the base of the skull through the vertex without intravenous contrast.  COMPARISON:  None.  FINDINGS: No cerebral edema, intracranial hemorrhage, mass effect, or midline shift. There is ventriculomegaly. The basilar cisterns are patent. No evidence of territorial infarct. No intracranial fluid collection. Dense well-seen calcifications are present. Calvarium is intact. Included paranasal sinuses and mastoid air cells are well aerated.  IMPRESSION: Ventriculomegaly, may be related central atrophy. No intracranial bleed or acute ischemia.   Electronically Signed   By: Jeb Levering M.D.   On: 04/20/2015 00:27   Ct Head Wo Contrast  04/20/2015   CLINICAL DATA:  Altered mental status.  EXAM:  CT HEAD WITHOUT CONTRAST  TECHNIQUE: Contiguous axial images were obtained from the base of the skull through the vertex without intravenous contrast.  COMPARISON:  None.  FINDINGS: No cerebral edema, intracranial hemorrhage, mass effect, or midline shift. There is ventriculomegaly. The basilar cisterns are patent. No evidence of territorial infarct. No intracranial fluid collection. Dense well-seen calcifications are present. Calvarium is intact. Included paranasal sinuses and mastoid air cells are well aerated.  IMPRESSION: Ventriculomegaly, may be related central atrophy. No intracranial bleed or acute ischemia.   Electronically Signed   By: Jeb Levering M.D.   On: 04/20/2015 00:27   Dg Chest Portable 1 View  04/19/2015   CLINICAL DATA:  Altered mental status.  Weakness.  EXAM: PORTABLE CHEST - 1 VIEW  COMPARISON:  None.  FINDINGS: The lungs are clear. Heart size is normal. There is no pneumothorax or pleural effusion. No focal bony abnormality.  IMPRESSION: Negative chest.   Electronically Signed   By: Inge Rise M.D.   On: 04/19/2015 20:51      Assessment & Plan: Ms. Thessaly Karis is a 64 y.o.  female with schizophrenia, atrial fibrillation, hypertension, anxiety, chronic kidney disease stage IV , who was admitted to [facility] on 04/19/2015 for hyponatremia  1. Hyponatremia: seems to be secondary to primary polydipsia from schizophrenia.  Sodium now improved to  131. Euvolemic on examination - Continue fluid restriction - off IV NS  2. Chronic Kidney Disease stage IV: no known baseline. Patient states that she follows with [facility]. Unclear etiology of chronic kidney disease.  - not currently on an ACE-I/ARB  3. Urinary tract infection: empiric treatment with ceftriaxone.  - pyuria and hematuria.  - pending cultures.   4. Hypertension: better controlled this morning.  - on metoprolol.    LOS: 1 Takia Runyon 7/27/20168:41 AM

## 2015-04-20 NOTE — Progress Notes (Signed)
[place] [facility] [place] NAME: Madison [name]    MR#:  [id number]  DATE OF BIRTH:  13-May-1951  SUBJECTIVE:  CHIEF COMPLAINT:   Chief Complaint  Patient presents with  . Altered Mental Status  Looks close to her baseline now.   REVIEW OF SYSTEMS:  Review of Systems  Constitutional: Negative for fever, weight loss, malaise/fatigue and diaphoresis.  HENT: Negative for ear discharge, ear pain, hearing loss, nosebleeds, sore throat and tinnitus.   Eyes: Negative for blurred vision and pain.  Respiratory: Negative for cough, hemoptysis, shortness of breath and wheezing.   Cardiovascular: Negative for chest pain, palpitations, orthopnea and leg swelling.  Gastrointestinal: Negative for heartburn, nausea, vomiting, abdominal pain, diarrhea, constipation and blood in stool.  Genitourinary: Negative for dysuria, urgency and frequency.  Musculoskeletal: Negative for myalgias and back pain.  Skin: Negative for itching and rash.  Neurological: Negative for dizziness, tingling, tremors, focal weakness, seizures, weakness and headaches.  Psychiatric/Behavioral: Negative for depression. The patient is not nervous/anxious.    DRUG ALLERGIES:  No Known Allergies VITALS:  Blood pressure 121/60, pulse 80, temperature 97.7 F (36.5 C), temperature source Oral, resp. rate 16, height 5\' 4"  (1.626 m), weight 55.7 kg (122 lb 12.7 oz), SpO2 100 %. PHYSICAL EXAMINATION:  Physical Exam  Constitutional: She is oriented to person, place, and time and well-developed, well-nourished, and in no distress.  HENT:  Head: Normocephalic and atraumatic.  Eyes: Conjunctivae and EOM are normal. Pupils are equal, round, and reactive to light.  Neck: Normal range of motion. Neck supple. No tracheal deviation present. No thyromegaly present.  Cardiovascular: Normal rate, regular rhythm and normal heart sounds.   Pulmonary/Chest: Effort normal and breath sounds normal. No  respiratory distress. She has no wheezes. She exhibits no tenderness.  Abdominal: Soft. Bowel sounds are normal. She exhibits no distension. There is no tenderness.  Musculoskeletal: Normal range of motion.  Neurological: She is alert and oriented to person, place, and time. No cranial nerve deficit.  Skin: Skin is warm and dry. No rash noted.  Psychiatric: Mood and affect normal.   LABORATORY PANEL:   CBC  Recent Labs Lab 04/19/15 1938  WBC 5.6  HGB 9.9*  HCT 28.1*  PLT 200   ------------------------------------------------------------------------------------------------------------------ Chemistries   Recent Labs Lab 04/20/15 0430  04/20/15 1617  NA 131*  < > 137  K 4.0  --   --   CL 99*  --   --   CO2 24  --   --   GLUCOSE 94  --   --   BUN 27*  --   --   CREATININE 2.15*  --   --   CALCIUM 9.0  --   --   AST 33  --   --   ALT 14  --   --   ALKPHOS 56  --   --   BILITOT 0.3  --   --   < > = values in this interval not displayed.  ASSESSMENT AND PLAN:  Madison [name] is a 64 y.o. female with a known history of schizophrenia, chronic kidney disease stage IV, chronic atrial fibrillation, hypertension and anxiety is brought into the ED for altered mental status by her sisters. Patient's sister start reporting that she is not being herself and pleasantly confused since yesterday. She got mixed up and not taking her medications properly. She reported to the ED physician that her nephrologist told her to "drink a lot  of water" and she has been drinking 6 x 2 L bottles of water daily. Patient was reporting that she was nauseous and vomited 3-4 times since last night.   1. Altered mental status  1 L fluid bolus was given in the ED CT head is ordered which is neg It seems close to her baseline. This could certainly be multifactorial - from severe hyponatremia, UTI, mix up of her psych meds.  2. Severe hypervolemic hyponatremia secondary to psychogenic polydipsia with a  complaint of dehydration from nausea and vomiting for the past 2 days. Improving and likely at her baseline   3. Abnormal urinalysis and rule out UTI Urine culture and sensitivity is pending  Empiric IV antibiotic Rocephin  4. Chronic kidney disease stage IV Nephrology consult is placed, outpatient follow-up with nephrology as recommended by them  5. Chronic history of atrial fibrillation: Rate controlled. Not on any blood thinners. She had some PR prolongation from 0.22->0.41 but now back down to 0.21. Also having some dropped beats/PACs - keep K close to 4 and mg close to 2.  6. Chronic Hypertension, blood pressure is elevated Not on any home medications continue small dose Lopressor and titrate as needed  All the records are reviewed and case discussed with Care Management/Social Worker. Management plans discussed with the patient, family and they are in agreement.  CODE STATUS: Full Code  TOTAL TIME TAKING CARE OF THIS PATIENT: 35 minutes.   More than 50% of the time was spent in counseling/coordination of care: YES  POSSIBLE D/C IN AM, DEPENDING ON CLINICAL CONDITION.   [facility], Cristyn Crossno M.D on 04/20/2015 at 5:56 PM  Between 7am to 6pm - Pager - [phone]  After 6pm go to [email or web address] - password [email or web address] [facility]  [facility]  Office  [phone]  CC:  Primary care physician; No PCP Per Patient

## 2015-04-20 NOTE — Progress Notes (Signed)
Notified MD about patients increased PR interval, no new orders , will continue to assess

## 2015-04-20 NOTE — Care Management (Signed)
Order present for CM assessment for home health needs.  Patient lives alone and was brought to the ED by her family members for altered mental status.  Patient lives alone and independent in all her adls.  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  Says there is a hired caregiver that stays with her mother for a few hours a day "then I have to do the rest."  She says that she can not think straight because she had not had her morning meds at the time of this interview which occurred around 8:4.  Patient's sodium was 114 upon arrival and is now up to 131.  She does have schizophrenia and there is a possibility that she may not be taking her medications correctly

## 2015-04-20 NOTE — Progress Notes (Signed)
Patient is alert and oriented to self, time, and place however disoriented to situation.  Vitals stable.  Afebrile.  NSR on cardiac monitor.  Up to commode to void, great uop.  MRSA screening positive, standing orders placed per protocol.  Per MD order, pt is to transfer to 2A once her sodium reaches 120 or greater.  Also per MD order, IV fluids are to be stopped once her sodium is 122 or greater.  Repeat serum sodium at 0035 was 126.  Report called to Lexi, RN on 2A.

## 2015-04-21 DIAGNOSIS — I10 Essential (primary) hypertension: Secondary | ICD-10-CM | POA: Diagnosis not present

## 2015-04-21 DIAGNOSIS — N3000 Acute cystitis without hematuria: Secondary | ICD-10-CM | POA: Diagnosis not present

## 2015-04-21 DIAGNOSIS — I482 Chronic atrial fibrillation: Secondary | ICD-10-CM | POA: Diagnosis not present

## 2015-04-21 DIAGNOSIS — R410 Disorientation, unspecified: Secondary | ICD-10-CM | POA: Diagnosis not present

## 2015-04-21 DIAGNOSIS — R4182 Altered mental status, unspecified: Secondary | ICD-10-CM | POA: Diagnosis not present

## 2015-04-21 DIAGNOSIS — N390 Urinary tract infection, site not specified: Secondary | ICD-10-CM | POA: Diagnosis not present

## 2015-04-21 DIAGNOSIS — F419 Anxiety disorder, unspecified: Secondary | ICD-10-CM | POA: Diagnosis not present

## 2015-04-21 DIAGNOSIS — N184 Chronic kidney disease, stage 4 (severe): Secondary | ICD-10-CM | POA: Diagnosis not present

## 2015-04-21 DIAGNOSIS — F209 Schizophrenia, unspecified: Secondary | ICD-10-CM | POA: Diagnosis not present

## 2015-04-21 DIAGNOSIS — E871 Hypo-osmolality and hyponatremia: Secondary | ICD-10-CM | POA: Diagnosis not present

## 2015-04-21 DIAGNOSIS — E860 Dehydration: Secondary | ICD-10-CM | POA: Diagnosis not present

## 2015-04-21 LAB — CBC
HCT: 31.1 % — ABNORMAL LOW (ref 35.0–47.0)
Hemoglobin: 10.5 g/dL — ABNORMAL LOW (ref 12.0–16.0)
MCH: 29.7 pg (ref 26.0–34.0)
MCHC: 33.7 g/dL (ref 32.0–36.0)
MCV: 88.2 fL (ref 80.0–100.0)
Platelets: 236 10*3/uL (ref 150–440)
RBC: 3.52 MIL/uL — AB (ref 3.80–5.20)
RDW: 13.8 % (ref 11.5–14.5)
WBC: 5.6 10*3/uL (ref 3.6–11.0)

## 2015-04-21 LAB — IRON AND TIBC
Iron: 22 ug/dL — ABNORMAL LOW (ref 28–170)
Saturation Ratios: 11 % (ref 10.4–31.8)
TIBC: 203 ug/dL — ABNORMAL LOW (ref 250–450)
UIBC: 181 ug/dL

## 2015-04-21 LAB — RENAL FUNCTION PANEL
ALBUMIN: 3.2 g/dL — AB (ref 3.5–5.0)
Anion gap: 8 (ref 5–15)
BUN: 34 mg/dL — AB (ref 6–20)
CO2: 24 mmol/L (ref 22–32)
Calcium: 9.1 mg/dL (ref 8.9–10.3)
Chloride: 112 mmol/L — ABNORMAL HIGH (ref 101–111)
Creatinine, Ser: 2.47 mg/dL — ABNORMAL HIGH (ref 0.44–1.00)
GFR calc non Af Amer: 19 mL/min — ABNORMAL LOW (ref >60)
GFR, EST AFRICAN AMERICAN: 22 mL/min — AB (ref >60)
Glucose, Bld: 100 mg/dL — ABNORMAL HIGH (ref 65–99)
Phosphorus: 3.2 mg/dL (ref 2.5–4.6)
Potassium: 4 mmol/L (ref 3.5–5.1)
Sodium: 144 mmol/L (ref 135–145)

## 2015-04-21 LAB — SODIUM
Sodium: 141 mmol/L (ref 135–145)
Sodium: 143 mmol/L (ref 135–145)
Sodium: 144 mmol/L (ref 135–145)

## 2015-04-21 LAB — FERRITIN: FERRITIN: 94 ng/mL (ref 11–307)

## 2015-04-21 MED ORDER — CIPROFLOXACIN HCL 250 MG PO TABS: 250.0000 mg | ORAL_TABLET | Freq: Two times a day (BID) | ORAL | Status: DC

## 2015-04-21 NOTE — Discharge Instructions (Signed)
Urinary Tract Infection A urinary tract infection (UTI) can occur any place along the urinary tract. The tract includes the kidneys, ureters, bladder, and urethra. A type of germ called bacteria often causes a UTI. UTIs are often helped with antibiotic medicine.  HOME CARE   If given, take antibiotics as told by your doctor. Finish them even if you start to feel better.  Drink enough fluids to keep your pee (urine) clear or pale yellow.  Avoid tea, drinks with caffeine, and bubbly (carbonated) drinks.  Pee often. Avoid holding your pee in for a long time.  Pee before and after having sex (intercourse).  Wipe from front to back after you poop (bowel movement) if you are a woman. Use each tissue only once. GET HELP RIGHT AWAY IF:   You have back pain.  You have lower belly (abdominal) pain.  You have chills.  You feel sick to your stomach (nauseous).  You throw up (vomit).  Your burning or discomfort with peeing does not go away.  You have a fever.  Your symptoms are not better in 3 days. MAKE SURE YOU:   Understand these instructions.  Will watch your condition.  Will get help right away if you are not doing well or get worse. Document Released: 02/27/2008 Document Revised: 06/04/2012 Document Reviewed: 04/10/2012 [facility] Patient Information 2015 [place], [place]. This information is not intended to replace advice given to you by your health care provider. Make sure you discuss any questions you have with your health care provider.   Hyponatremia  Hyponatremia is when the salt (sodium) in your blood is low. When salt becomes low, your cells take in extra water and puff up (swell). The puffiness can happen in the whole body. It mostly affects the brain and is very serious.  HOME CARE  Only take medicine as told by your doctor.  Follow any diet instructions you were given. This includes limiting how much fluid you drink.  Keep all doctor visits for tests as  told.  Avoid alcohol and drugs. GET HELP RIGHT AWAY IF:  You start to twitch and shake (seize).  You pass out (faint).  You continue to have watery poop (diarrhea) or you throw up (vomit).  You feel sick to your stomach (nauseous).  You are tired (fatigued), have a headache, are confused, or feel weak.  Your problems that first brought you to the doctor come back.  You have trouble following your diet instructions. MAKE SURE YOU:   Understand these instructions.  Will watch your condition.  Will get help right away if you are not doing well or get worse. Document Released: 05/23/2011 Document Revised: 12/03/2011 Document Reviewed: 05/23/2011 [facility] Patient Information 2015 [place], [place]. This information is not intended to replace advice given to you by your health care provider. Make sure you discuss any questions you have with your health care provider.

## 2015-04-21 NOTE — Progress Notes (Signed)
[facility]  ROUNDING NOTE   Subjective:   Patient's sodium is at goal.  She is eating and tolerating PO.  Creatinine at baseline.  Objective:  Vital signs in last 24 hours:  Temp:  [97.7 F (36.5 C)-98.4 F (36.9 C)] 98.4 F (36.9 C) (07/28 0430) Pulse Rate:  [64-80] 76 (07/28 0430) Resp:  [16-21] 21 (07/28 0430) BP: (113-121)/(58-60) 113/58 mmHg (07/28 0430) SpO2:  [100 %] 100 % (07/28 0430)  Weight change:  Filed Weights   04/19/15 1930 04/19/15 2332  Weight: 58.968 kg (130 lb) 55.7 kg (122 lb 12.7 oz)    Intake/Output: I/O last 3 completed shifts: In: 280 [P.O.:50; I.V.:180; IV Piggyback:50] Out: 6050 [Urine:6050]   Intake/Output this shift:     Physical Exam: General: NAD  Head: Normocephalic, atraumatic. Moist oral mucosal membranes  Eyes: Anicteric, PERRL  Neck: Supple, trachea midline  Lungs:  Clear to auscultation  Heart: Regular rate and rhythm  Abdomen:  Soft, nontender,   Extremities: no peripheral edema.  Neurologic: Nonfocal, moving all four extremities  Skin: No lesions  Psych Flat affect    Basic Metabolic Panel:  Recent Labs Lab 04/19/15 1938  04/20/15 0430  04/20/15 1617 04/20/15 2010 04/21/15 0126 04/21/15 0442 04/21/15 0810  NA 114*  < > 131*  < > 137 138 141 143 144  K 3.8  --  4.0  --   --   --   --   --  4.0  CL 81*  --  99*  --   --   --   --   --  112*  CO2 22  --  24  --   --   --   --   --  24  GLUCOSE 111*  --  94  --   --   --   --   --  100*  BUN 24*  --  27*  --   --   --   --   --  34*  CREATININE 2.13*  --  2.15*  --   --   --   --   --  2.47*  CALCIUM 8.9  --  9.0  --   --   --   --   --  9.1  PHOS  --   --   --   --   --   --   --   --  3.2  < > = values in this interval not displayed.  Liver Function Tests:  Recent Labs Lab 04/19/15 1938 04/20/15 0430 04/21/15 0810  AST 36 33  --   ALT 15 14  --   ALKPHOS 56 56  --   BILITOT 0.5 0.3  --   PROT 6.9 6.7  --   ALBUMIN 3.6 3.1* 3.2*   No  results for input(s): LIPASE, AMYLASE in the last 168 hours. No results for input(s): AMMONIA in the last 168 hours.  CBC:  Recent Labs Lab 04/19/15 1938 04/21/15 0810  WBC 5.6 5.6  HGB 9.9* 10.5*  HCT 28.1* 31.1*  MCV 84.6 88.2  PLT 200 236    Cardiac Enzymes:  Recent Labs Lab 04/19/15 1938  TROPONINI <0.03    BNP: Invalid input(s): POCBNP  CBG: No results for input(s): GLUCAP in the last 168 hours.  Microbiology: Results for orders placed or performed during the hospital encounter of 04/19/15  MRSA PCR Screening     Status: Abnormal   Collection Time: 04/20/15 12:13 AM  Result Value Ref Range Status   MRSA by PCR POSITIVE (A) NEGATIVE Final    Comment:        The GeneXpert MRSA Assay (FDA approved for NASAL specimens only), is one component of a comprehensive MRSA colonization surveillance program. It is not intended to diagnose MRSA infection nor to guide or monitor treatment for MRSA infections. CRITICAL RESULT CALLED TO, READ BACK BY AND VERIFIED WITH: CAROL PATEL @ 0142 7.27.16 MPG     Coagulation Studies: No results for input(s): LABPROT, INR in the last 72 hours.  Urinalysis:  Recent Labs  04/19/15 1938 04/20/15 0013  COLORURINE STRAW* COLORLESS*  LABSPEC 1.002* 1.001*  PHURINE 6.0 7.0  GLUCOSEU NEGATIVE NEGATIVE  HGBUR 2+* 1+*  BILIRUBINUR NEGATIVE NEGATIVE  KETONESUR NEGATIVE NEGATIVE  PROTEINUR 100* 100*  NITRITE NEGATIVE NEGATIVE  LEUKOCYTESUR 1+* TRACE*      Imaging: Ct Head Wo Contrast  04/20/2015   CLINICAL DATA:  Altered mental status.  EXAM: CT HEAD WITHOUT CONTRAST  TECHNIQUE: Contiguous axial images were obtained from the base of the skull through the vertex without intravenous contrast.  COMPARISON:  None.  FINDINGS: No cerebral edema, intracranial hemorrhage, mass effect, or midline shift. There is ventriculomegaly. The basilar cisterns are patent. No evidence of territorial infarct. No intracranial fluid collection.  Dense well-seen calcifications are present. Calvarium is intact. Included paranasal sinuses and mastoid air cells are well aerated.  IMPRESSION: Ventriculomegaly, may be related central atrophy. No intracranial bleed or acute ischemia.   Electronically Signed   By: Jeb Levering M.D.   On: 04/20/2015 00:27   Ct Head Wo Contrast  04/20/2015   CLINICAL DATA:  Altered mental status.  EXAM: CT HEAD WITHOUT CONTRAST  TECHNIQUE: Contiguous axial images were obtained from the base of the skull through the vertex without intravenous contrast.  COMPARISON:  None.  FINDINGS: No cerebral edema, intracranial hemorrhage, mass effect, or midline shift. There is ventriculomegaly. The basilar cisterns are patent. No evidence of territorial infarct. No intracranial fluid collection. Dense well-seen calcifications are present. Calvarium is intact. Included paranasal sinuses and mastoid air cells are well aerated.  IMPRESSION: Ventriculomegaly, may be related central atrophy. No intracranial bleed or acute ischemia.   Electronically Signed   By: Jeb Levering M.D.   On: 04/20/2015 00:27   Dg Chest Portable 1 View  04/19/2015   CLINICAL DATA:  Altered mental status.  Weakness.  EXAM: PORTABLE CHEST - 1 VIEW  COMPARISON:  None.  FINDINGS: The lungs are clear. Heart size is normal. There is no pneumothorax or pleural effusion. No focal bony abnormality.  IMPRESSION: Negative chest.   Electronically Signed   By: Inge Rise M.D.   On: 04/19/2015 20:51     Medications:   . sodium chloride Stopped (04/20/15 0330)   . cefTRIAXone (ROCEPHIN)  IV  1 g Intravenous Q24H  . Chlorhexidine Gluconate Cloth  6 each Topical Q0600  . docusate sodium  100 mg Oral BID  . gabapentin  100 mg Oral TID  . heparin  5,000 Units Subcutaneous 3 times per day  . metoprolol tartrate  25 mg Oral BID  . mupirocin ointment  1 application Nasal BID  . OLANZapine  15 mg Oral QHS  . pantoprazole  40 mg Oral Daily  . sodium chloride  3  mL Intravenous Q12H  . thiamine  50 mg Oral Daily   acetaminophen **OR** acetaminophen, clonazePAM, ondansetron **OR** ondansetron (ZOFRAN) IV, oxyCODONE  Assessment/ Plan:  Madison [name] is  a 64 y.o. black  female with schizophrenia, atrial fibrillation, hypertension, anxiety, chronic kidney disease stage IV , who was admitted to [facility] on 04/19/2015 for hyponatremia  1. Hyponatremia: seems to be secondary to primary polydipsia from schizophrenia.  Sodium now improved to 144. Euvolemic on examination - Continue fluid restriction - off IV NS  2. Chronic Kidney Disease stage IV: creatinine of 2.47, eGFr of 22. Patient states that she follows with [facility]. Unclear etiology of chronic kidney disease.  - not currently on an ACE-I/ARB  3. Urinary tract infection: empiric treatment with ceftriaxone. Urine culture pending.   4. Hypertension: better controlled this morning.  - on metoprolol.    LOS: 2 Madison [name] 7/28/20169:30 AM

## 2015-04-21 NOTE — Progress Notes (Signed)
Patient given discharge teaching and paperwork regarding medications, diet, follow-up appointments and activity. Patient understanding verbalized. No complaints at this time. IV and telemetry discontinued before leaving. Skin assessment as previously charted and vitals are stable. Patient being discharged to home. Caregiver/family present during discharge teaching. No further needs by Care Management.

## 2015-04-21 NOTE — Care Management (Signed)
Patient is for discharge today and is oriented.  All verbal responses are appropriate.  She admits to not taking her medications correctly.  Her sister is present and relayed that patient will no longer have the responsibility of being the primary caregiver for her mother.  Patient[facility] [id number] [facility] the [facility].  Says she saw her physician last week.  Patient is agreeable to home health nurse to monitor nutrition, hydration, medications.  Referral to [facility]

## 2015-04-22 LAB — PROTEIN ELECTROPHORESIS, SERUM
A/G RATIO SPE: 0.8 (ref 0.7–1.7)
ALPHA-1-GLOBULIN: 0.3 g/dL (ref 0.0–0.4)
Albumin ELP: 2.8 g/dL — ABNORMAL LOW (ref 2.9–4.4)
Alpha-2-Globulin: 0.8 g/dL (ref 0.4–1.0)
Beta Globulin: 0.9 g/dL (ref 0.7–1.3)
GLOBULIN, TOTAL: 3.4 g/dL (ref 2.2–3.9)
Gamma Globulin: 1.4 g/dL (ref 0.4–1.8)
Total Protein ELP: 6.2 g/dL (ref 6.0–8.5)

## 2015-04-22 LAB — HEPATITIS C ANTIBODY: HCV Ab: <0.1 s/co ratio (ref 0.0–0.9)

## 2015-04-22 LAB — URINE CULTURE

## 2015-04-22 LAB — HEPATITIS B SURFACE ANTIGEN: HEP B S AG: NEGATIVE

## 2015-04-22 LAB — HEPATITIS B CORE ANTIBODY, IGM: Hep B C IgM: NEGATIVE

## 2015-04-22 LAB — HEPATITIS B SURFACE ANTIBODY,QUALITATIVE: Hep B S Ab: REACTIVE

## 2015-04-22 LAB — PARATHYROID HORMONE, INTACT (NO CA): PTH: 99 pg/mL — AB (ref 15–65)

## 2015-04-22 NOTE — Discharge Summary (Signed)
[facility] [place] [facility] NAME: Madison [name]    MR#:  [id number]  DATE OF BIRTH:  12/04/1950  DATE OF ADMISSION:  04/19/2015 ADMITTING PHYSICIAN: Nicholes Mango, MD  DATE OF DISCHARGE: 04/21/2015  1:21 PM  PRIMARY CARE PHYSICIAN: [facility]   ADMISSION DIAGNOSIS:  Confusion [R41.0] Hyponatremia [E87.1] Altered mental status [R41.82] DISCHARGE DIAGNOSIS:  Active Problems:   Altered mental status UTI SECONDARY DIAGNOSIS:   Past Medical History  Diagnosis Date  . Anxiety    HOSPITAL COURSE:  a 64 y.o. female with a known history of schizophrenia, chronic kidney disease stage IV, chronic atrial fibrillation, hypertension and anxiety was admitted for altered mental status. Please see Dr Rinaldo Ratel dictated [place] for further details. This was though to be multifactorial - from severe hyponatremia, UTI, mix up of her psych meds. She was treated for UTI and also given IVF which helped her Hyponatremia and was normalized. She was feeling much better and her mental status was back to baseline.   She is being discharged home in stable condition, she was agreeable with discharge plans. DISCHARGE CONDITIONS:  stable CONSULTS OBTAINED:  Treatment Team:  Nicholes Mango, MD Max Sane, MD Lavonia Dana, MD DRUG ALLERGIES:  No Known Allergies DISCHARGE MEDICATIONS:   Discharge Medication List as of 04/21/2015 11:09 AM    START taking these medications   Details  ciprofloxacin (CIPRO) 250 MG tablet Take 1 tablet (250 mg total) by mouth 2 (two) times daily., Starting 04/21/2015, Until Discontinued, Normal      CONTINUE these medications which have NOT CHANGED   Details  clonazePAM (KLONOPIN) 0.5 MG tablet Take 0.5 mg by mouth 2 (two) times daily as needed for anxiety., Until Discontinued, Historical Med    gabapentin (NEURONTIN) 100 MG capsule Take 100 mg by mouth 3 (three) times daily., Until Discontinued, Historical Med     Multiple Vitamin (MULTIVITAMIN) tablet Take 1 tablet by mouth daily., Until Discontinued, Historical Med    OLANZapine (ZYPREXA) 10 MG tablet Take 15 mg by mouth at bedtime., Until Discontinued, Historical Med    thiamine (VITAMIN B-1) 50 MG tablet Take 50 mg by mouth daily., Until Discontinued, Historical Med       DISCHARGE INSTRUCTIONS:   DIET:  Regular diet DISCHARGE CONDITION:  Good ACTIVITY:  Activity as tolerated OXYGEN:  Home Oxygen: No.  Oxygen Delivery: room air DISCHARGE LOCATION:  home   If you experience worsening of your admission symptoms, develop shortness of breath, life threatening emergency, suicidal or homicidal thoughts you must seek medical attention immediately by calling 911 or calling your MD immediately  if symptoms less severe.  You Must read complete instructions/literature along with all the possible adverse reactions/side effects for all the Medicines you take and that have been prescribed to you. Take any new Medicines after you have completely understood and accpet all the possible adverse reactions/side effects.   Please note  You were cared for by a hospitalist during your hospital stay. If you have any questions about your discharge medications or the care you received while you were in the hospital after you are discharged, you can call the unit and asked to speak with the hospitalist on call if the hospitalist that took care of you is not available. Once you are discharged, your primary care physician will handle any further medical issues. Please note that NO REFILLS for any discharge medications will be authorized once you are discharged,  as it is imperative that you return to your primary care physician (or establish a relationship with a primary care physician if you do not have one) for your aftercare needs so that they can reassess your need for medications and monitor your lab values.   On the day of Discharge: VITAL SIGNS:  Blood  pressure 135/69, pulse 68, temperature 98.4 F (36.9 C), temperature source Oral, resp. rate 21, height 5\' 4"  (1.626 m), weight 55.7 kg (122 lb 12.7 oz), SpO2 100 %. PHYSICAL EXAMINATION:  GENERAL:  64 y.o.-year-old patient lying in the bed with no acute distress.  EYES: Pupils equal, round, reactive to light and accommodation. No scleral icterus. Extraocular muscles intact.  HEENT: Head atraumatic, normocephalic. Oropharynx and nasopharynx clear.  NECK:  Supple, no jugular venous distention. No thyroid enlargement, no tenderness.  LUNGS: Normal breath sounds bilaterally, no wheezing, rales,rhonchi or crepitation. No use of accessory muscles of respiration.  CARDIOVASCULAR: S1, S2 normal. No murmurs, rubs, or gallops.  ABDOMEN: Soft, non-tender, non-distended. Bowel sounds present. No organomegaly or mass.  EXTREMITIES: No pedal edema, cyanosis, or clubbing.  NEUROLOGIC: Cranial nerves II through XII are intact. Muscle strength 5/5 in all extremities. Sensation intact. Gait not checked.  PSYCHIATRIC: The patient is alert and oriented x 3.  SKIN: No obvious rash, lesion, or ulcer.  DATA REVIEW:   CBC  Recent Labs Lab 04/21/15 0810  WBC 5.6  HGB 10.5*  HCT 31.1*  PLT 236    Chemistries   Recent Labs Lab 04/20/15 0430  04/21/15 0810 04/21/15 1255  NA 131*  < > 144 144  K 4.0  --  4.0  --   CL 99*  --  112*  --   CO2 24  --  24  --   GLUCOSE 94  --  100*  --   BUN 27*  --  34*  --   CREATININE 2.15*  --  2.47*  --   CALCIUM 9.0  --  9.1  --   AST 33  --   --   --   ALT 14  --   --   --   ALKPHOS 56  --   --   --   BILITOT 0.3  --   --   --   < > = values in this interval not displayed.  Microbiology Results: Urine c/s grew Strep Agalactie  Management plans discussed with the patient, family and they are in agreement.  CODE STATUS: FULL CODE  TOTAL TIME TAKING CARE OF THIS PATIENT: 55 minutes.    [facility], Samamtha Tiegs M.D on 04/22/2015 at 4:01 PM  Between 7am to 6pm - Pager  - [phone]  After 6pm go to [email or web address] - password [email or web address] [facility]  [facility]  Office  [phone]  CC: Primary care physician; [facility]

## 2015-05-10 DIAGNOSIS — F259 Schizoaffective disorder, unspecified: Secondary | ICD-10-CM | POA: Diagnosis not present

## 2015-05-12 DIAGNOSIS — E782 Mixed hyperlipidemia: Secondary | ICD-10-CM | POA: Diagnosis not present

## 2015-05-12 DIAGNOSIS — N183 Chronic kidney disease, stage 3 (moderate): Secondary | ICD-10-CM | POA: Diagnosis not present

## 2015-05-12 DIAGNOSIS — F319 Bipolar disorder, unspecified: Secondary | ICD-10-CM | POA: Diagnosis not present

## 2015-05-12 DIAGNOSIS — M154 Erosive (osteo)arthritis: Secondary | ICD-10-CM | POA: Diagnosis not present

## 2015-05-12 DIAGNOSIS — E871 Hypo-osmolality and hyponatremia: Secondary | ICD-10-CM | POA: Diagnosis not present

## 2015-05-16 DIAGNOSIS — N183 Chronic kidney disease, stage 3 (moderate): Secondary | ICD-10-CM | POA: Diagnosis not present

## 2015-05-16 DIAGNOSIS — N251 Nephrogenic diabetes insipidus: Secondary | ICD-10-CM | POA: Diagnosis not present

## 2015-05-16 DIAGNOSIS — N179 Acute kidney failure, unspecified: Secondary | ICD-10-CM | POA: Diagnosis not present

## 2015-06-03 DIAGNOSIS — N183 Chronic kidney disease, stage 3 (moderate): Secondary | ICD-10-CM | POA: Diagnosis not present

## 2015-06-03 DIAGNOSIS — F319 Bipolar disorder, unspecified: Secondary | ICD-10-CM | POA: Diagnosis not present

## 2015-06-03 DIAGNOSIS — M154 Erosive (osteo)arthritis: Secondary | ICD-10-CM | POA: Diagnosis not present

## 2015-07-07 DIAGNOSIS — F259 Schizoaffective disorder, unspecified: Secondary | ICD-10-CM | POA: Diagnosis not present

## 2015-08-04 DIAGNOSIS — G3184 Mild cognitive impairment, so stated: Secondary | ICD-10-CM | POA: Diagnosis not present

## 2015-08-15 DIAGNOSIS — E559 Vitamin D deficiency, unspecified: Secondary | ICD-10-CM | POA: Diagnosis not present

## 2015-08-15 DIAGNOSIS — N251 Nephrogenic diabetes insipidus: Secondary | ICD-10-CM | POA: Diagnosis not present

## 2015-08-15 DIAGNOSIS — N183 Chronic kidney disease, stage 3 (moderate): Secondary | ICD-10-CM | POA: Diagnosis not present

## 2015-09-01 DIAGNOSIS — F259 Schizoaffective disorder, unspecified: Secondary | ICD-10-CM | POA: Diagnosis not present

## 2015-09-02 DIAGNOSIS — N183 Chronic kidney disease, stage 3 (moderate): Secondary | ICD-10-CM | POA: Diagnosis not present

## 2015-09-02 DIAGNOSIS — E871 Hypo-osmolality and hyponatremia: Secondary | ICD-10-CM | POA: Diagnosis not present

## 2015-09-02 DIAGNOSIS — M154 Erosive (osteo)arthritis: Secondary | ICD-10-CM | POA: Diagnosis not present

## 2015-09-02 DIAGNOSIS — F319 Bipolar disorder, unspecified: Secondary | ICD-10-CM | POA: Diagnosis not present

## 2015-10-04 DIAGNOSIS — F259 Schizoaffective disorder, unspecified: Secondary | ICD-10-CM | POA: Diagnosis not present

## 2015-10-05 DIAGNOSIS — F259 Schizoaffective disorder, unspecified: Secondary | ICD-10-CM | POA: Diagnosis not present

## 2015-11-08 DIAGNOSIS — F259 Schizoaffective disorder, unspecified: Secondary | ICD-10-CM | POA: Diagnosis not present

## 2015-12-01 DIAGNOSIS — F259 Schizoaffective disorder, unspecified: Secondary | ICD-10-CM | POA: Diagnosis not present

## 2015-12-14 ENCOUNTER — Telehealth: Payer: Self-pay

## 2015-12-14 NOTE — Telephone Encounter (Signed)
Baird Kay is the caregiver of patient and they received a letter to be triaged. Please call [phone]

## 2015-12-14 NOTE — Telephone Encounter (Signed)
LMOM to call.

## 2015-12-19 NOTE — Telephone Encounter (Signed)
I have talked to Baird Kay, the caregiver. She said the pt's last colonoscopy was done in [place]. They think about 10 years ago She said she had some polyps but not cancerous. She will try to get the report. She said the pt has Stage IV kidney failure and sees a psychiatrist monthly. She does not have a list of her meds, but will get those and bring by the office on Thursday after lunch.  PT lives in [place], and caregiver lives in [place].

## 2015-12-22 NOTE — Telephone Encounter (Signed)
Gastroenterology Pre-Procedure Review  Request Date: 12/22/2015 Requesting Physician: Dr. Criss Rosales  PATIENT REVIEW QUESTIONS: The patient responded to the following health history questions as indicated:    Info given by caregiver, Darliss Ridgel Bigelow/ Last colonoscopy years ago in [place] and pt had polyps but not sure what kind She does have stage IV kidney failure  1. Diabetes Melitis: no 2. Joint replacements in the past 12 months: no 3. Major health problems in the past 3 months: no 4. Has an artificial valve or MVP: no 5. Has a defibrillator: no 6. Has been advised in past to take antibiotics in advance of a procedure like teeth cleaning: no 7. Family history of colon cancer: no  8. Alcohol Use: no 9. History of sleep apnea: no     MEDICATIONS & ALLERGIES:    Patient reports the following regarding taking any blood thinners:   Plavix? no Aspirin? no Coumadin? no  Patient confirms/reports the following medications:  Current Outpatient Prescriptions  Medication Sig Dispense Refill  . gabapentin (NEURONTIN) 100 MG capsule Take 100 mg by mouth 3 (three) times daily. Pt takes one tablet in the AM and 2 tablets at bedtime    . Multiple Vitamin (MULTIVITAMIN) tablet Take 1 tablet by mouth daily.    Marland Kitchen OLANZapine (ZYPREXA) 20 MG tablet Take 20 mg by mouth at bedtime.    Marland Kitchen OLANZapine (ZYPREXA) 5 MG tablet Take 5 mg by mouth at bedtime.    . thiamine (VITAMIN B-1) 50 MG tablet Take 50 mg by mouth daily.    . ciprofloxacin (CIPRO) 250 MG tablet Take 1 tablet (250 mg total) by mouth 2 (two) times daily. (Patient not taking: Reported on 12/22/2015) 10 tablet 0  . clonazePAM (KLONOPIN) 0.5 MG tablet Take 0.5 mg by mouth 2 (two) times daily as needed for anxiety. Reported on 12/22/2015     No current facility-administered medications for this visit.    Patient confirms/reports the following allergies:  No Known Allergies  No orders of the defined types were placed in this encounter.     AUTHORIZATION INFORMATION Primary Insurance:   ID #:   Group #:  Pre-Cert / Auth required:  Pre-Cert / Auth #:   Secondary Insurance:   ID #:  Group #:  Pre-Cert / Auth required:  Pre-Cert / Auth #:   SCHEDULE INFORMATION: Procedure has been scheduled as follows:  Date:               Time:   Location:   This Gastroenterology Pre-Precedure Review Form is being routed to the following provider(s): Barney Drain, MD

## 2015-12-22 NOTE — Addendum Note (Signed)
Addended by: Everardo All on: 12/22/2015 02:02 PM   Modules accepted: Orders, Medications

## 2015-12-24 NOTE — Telephone Encounter (Signed)
SUPREP SPLIT DOSING-REGULAR breakfast then CLEAR LIQUIDS after 9 am.

## 2015-12-26 NOTE — Telephone Encounter (Signed)
I spoke to caregiver, Baird Kay and pt is tentatively scheduled for 01/20/2016 at 1:15 pm with Dr. Oneida Alar for the colonoscopy.  [name] will be out of town, but will check and see if one of her sisters can bring her.  She will call me back tomorrow.

## 2015-12-27 NOTE — Telephone Encounter (Signed)
Mrs. Madison [name] left a vm confirming the appt on 01/20/2016. Said she has questions but she will call sometime today between appts that she has.

## 2015-12-28 NOTE — Telephone Encounter (Signed)
Pt has been confirmed for 01/20/2016 at 1:15 PM with Dr. Valinda Hoar. Mrs. Madison [name] said the nephrologist was concerned about prep and said she did not need to use an enema. I told her that Dr. Nona Dell is ordering the Suprep and no enema with that prep. She is going to call the nephrologist and let him know and see if he approves. She will call me back before I mail the info.

## 2015-12-29 NOTE — Telephone Encounter (Signed)
Left Vm that I will send the prep when she gets an answer from the nephrologist about the prep.

## 2016-01-02 NOTE — Telephone Encounter (Signed)
Ms. Cherlyn Cushing left Vm that Nepthrologist said OK to use Go Lytely.

## 2016-01-03 NOTE — Telephone Encounter (Signed)
I called Ms Madison [name] and she said she asked the doctor's nurse if pt could have the suprep and the nurse called her back and said the nephrologist prefers the Go Lytely.   Sending to Dr. Oneida Alar to advise!

## 2016-01-03 NOTE — Telephone Encounter (Signed)
PLEASE CALL PT's sister. We will go with what her kidney doctor recommends(golytely). CLEAR LIQUIDS WITH BREAKFAST.

## 2016-01-05 ENCOUNTER — Other Ambulatory Visit: Payer: Self-pay

## 2016-01-05 ENCOUNTER — Telehealth: Payer: Self-pay | Admitting: Gastroenterology

## 2016-01-05 DIAGNOSIS — Z1211 Encounter for screening for malignant neoplasm of colon: Secondary | ICD-10-CM

## 2016-01-05 DIAGNOSIS — D696 Thrombocytopenia, unspecified: Secondary | ICD-10-CM | POA: Insufficient documentation

## 2016-01-05 NOTE — Telephone Encounter (Signed)
Called [name] @ [phone] and left the message on Vm that pt needs CBC and [place] prior to colonoscopy on 01/20/2016.   Sending to Ginger to schedule the [place].

## 2016-01-05 NOTE — Telephone Encounter (Signed)
I called in the prescription for the Golytely to [place] at [facility]. I have mailed the instructions to pt.

## 2016-01-05 NOTE — Telephone Encounter (Signed)
PLEASE CALL PT'S SISTER. I REVIEWED LABS FROM MAR 30. HER SISTER HAS A LOW PLT CT(130K) WHICH WILL INCREASE RISK OF BLEEDING IF A POLYP IS REMOVED. SHE HAD A  NORMAL PLT COUNT IN [id number]). SHE NEEDS A REPEAT CBC AND ABDOMINAL U/S NEXT WEEK, DX: THROMBOCYTOPENIA.

## 2016-01-09 ENCOUNTER — Other Ambulatory Visit: Payer: Self-pay

## 2016-01-09 DIAGNOSIS — D696 Thrombocytopenia, unspecified: Secondary | ICD-10-CM

## 2016-01-09 NOTE — Telephone Encounter (Signed)
[name] is aware for pt to get CBC and that [name] will call to schedule the [place].

## 2016-01-09 NOTE — Telephone Encounter (Signed)
Pt is set for [place] on 04/19 @ 0915. Called pt and LMOM regarding appt

## 2016-01-11 ENCOUNTER — Ambulatory Visit (HOSPITAL_COMMUNITY)
Admission: RE | Admit: 2016-01-11 | Discharge: 2016-01-11 | Disposition: A | Discharge status: Home / Self Care | Payer: Medicare Other | Source: Ambulatory Visit | Attending: Gastroenterology | Admitting: Gastroenterology

## 2016-01-11 DIAGNOSIS — D696 Thrombocytopenia, unspecified: Secondary | ICD-10-CM

## 2016-01-11 DIAGNOSIS — N281 Cyst of kidney, acquired: Secondary | ICD-10-CM | POA: Insufficient documentation

## 2016-01-11 DIAGNOSIS — Z9049 Acquired absence of other specified parts of digestive tract: Secondary | ICD-10-CM | POA: Insufficient documentation

## 2016-01-11 LAB — CBC WITH DIFFERENTIAL/PLATELET
Basophils Absolute: 37 cells/uL (ref 0–200)
Basophils Relative: 1 %
EOS PCT: 2 %
Eosinophils Absolute: 74 cells/uL (ref 15–500)
HCT: 30.9 % — ABNORMAL LOW (ref 35.0–45.0)
Hemoglobin: 10 g/dL — ABNORMAL LOW (ref 11.7–15.5)
LYMPHS PCT: 33 %
Lymphs Abs: 1221 cells/uL (ref 850–3900)
MCH: 29.7 pg (ref 27.0–33.0)
MCHC: 32.4 g/dL (ref 32.0–36.0)
MCV: 91.7 fL (ref 80.0–100.0)
MONOS PCT: 7 %
MPV: 12.4 fL (ref 7.5–12.5)
Monocytes Absolute: 259 cells/uL (ref 200–950)
NEUTROS PCT: 57 %
Neutro Abs: 2109 cells/uL (ref 1500–7800)
Platelets: 168 10*3/uL (ref 140–400)
RBC: 3.37 MIL/uL — AB (ref 3.80–5.10)
RDW: 14.1 % (ref 11.0–15.0)
WBC: 3.7 10*3/uL — AB (ref 3.8–10.8)

## 2016-01-16 ENCOUNTER — Telehealth: Payer: Self-pay | Admitting: Gastroenterology

## 2016-01-19 ENCOUNTER — Telehealth: Payer: Self-pay

## 2016-01-19 NOTE — Telephone Encounter (Signed)
Pt is calling to make sure that her insurance is aware that she will be having her TCS in the morning. I told her that we have to check with the insurance company to make sure it does not need a PA. She understood

## 2016-01-20 ENCOUNTER — Encounter (HOSPITAL_COMMUNITY)
Admission: RE | Disposition: A | Discharge status: Home Health | Payer: Self-pay | Source: Ambulatory Visit | Attending: Gastroenterology

## 2016-01-20 ENCOUNTER — Encounter (HOSPITAL_COMMUNITY): Payer: Self-pay | Admitting: *Deleted

## 2016-01-20 ENCOUNTER — Ambulatory Visit (HOSPITAL_COMMUNITY)
Admission: RE | Admit: 2016-01-20 | Discharge: 2016-01-20 | Disposition: A | Discharge status: Home Health | Payer: Medicare Other | Source: Ambulatory Visit | Attending: Gastroenterology | Admitting: Gastroenterology

## 2016-01-20 DIAGNOSIS — D123 Benign neoplasm of transverse colon: Secondary | ICD-10-CM | POA: Insufficient documentation

## 2016-01-20 DIAGNOSIS — F419 Anxiety disorder, unspecified: Secondary | ICD-10-CM | POA: Insufficient documentation

## 2016-01-20 DIAGNOSIS — K621 Rectal polyp: Secondary | ICD-10-CM

## 2016-01-20 DIAGNOSIS — Z1211 Encounter for screening for malignant neoplasm of colon: Secondary | ICD-10-CM | POA: Insufficient documentation

## 2016-01-20 DIAGNOSIS — D125 Benign neoplasm of sigmoid colon: Secondary | ICD-10-CM | POA: Insufficient documentation

## 2016-01-20 DIAGNOSIS — K635 Polyp of colon: Secondary | ICD-10-CM

## 2016-01-20 DIAGNOSIS — Z79899 Other long term (current) drug therapy: Secondary | ICD-10-CM | POA: Insufficient documentation

## 2016-01-20 HISTORY — PX: COLONOSCOPY: SHX5424

## 2016-01-20 SURGERY — COLONOSCOPY
Anesthesia: Moderate Sedation

## 2016-01-20 MED ORDER — SODIUM CHLORIDE 0.9 % IV SOLN
INTRAVENOUS | Status: DC
  Administered 2016-01-20: 1000 mL via INTRAVENOUS

## 2016-01-20 MED ORDER — MIDAZOLAM HCL 5 MG/5ML IJ SOLN
INTRAMUSCULAR | Status: DC | PRN
  Administered 2016-01-20: 1 mg via INTRAVENOUS
  Administered 2016-01-20: 2 mg via INTRAVENOUS

## 2016-01-20 MED ORDER — MIDAZOLAM HCL 5 MG/5ML IJ SOLN
INTRAMUSCULAR | Status: AC
  Filled 2016-01-20: qty 10

## 2016-01-20 MED ORDER — STERILE WATER FOR IRRIGATION IR SOLN
Status: DC | PRN
  Administered 2016-01-20: 14:00:00

## 2016-01-20 MED ORDER — MEPERIDINE HCL 100 MG/ML IJ SOLN
INTRAMUSCULAR | Status: AC
  Filled 2016-01-20: qty 2

## 2016-01-20 MED ORDER — MEPERIDINE HCL 100 MG/ML IJ SOLN
INTRAMUSCULAR | Status: DC | PRN
  Administered 2016-01-20: 50 mg via INTRAVENOUS

## 2016-01-20 NOTE — Op Note (Signed)
[facility] Patient Name: Madison [name] Procedure Date: 01/20/2016 1:55 PM MRN: [id number] Date of Birth: Feb 06, 1951 Attending MD: Barney Drain , MD CSN: [id number] Age: 65 Admit Type: Outpatient Procedure:                Colonoscopy Indications:              Screening for colorectal malignant neoplasm Providers:                Barney Drain, MD, Rosina Lowenstein, RN, Georgeann Oppenheim,                            Technician Referring MD:             Almedia Balls Medicines:                Promethazine 25 mg IV, Meperidine 50 mg IV,                            Midazolam 3 mg IV Complications:            No immediate complications. Estimated Blood Loss:     Estimated blood loss was minimal. Procedure:                Pre-Anesthesia Assessment:                           - Prior to the procedure, a History and Physical                            was performed, and patient medications and                            allergies were reviewed. The patient's tolerance of                            previous anesthesia was also reviewed. The risks                            and benefits of the procedure and the sedation                            options and risks were discussed with the patient.                            All questions were answered, and informed consent                            was obtained. Prior Anticoagulants: The patient has                            taken no previous anticoagulant or antiplatelet                            agents. ASA Grade Assessment: II - A patient with  mild systemic disease. After reviewing the risks                            and benefits, the patient was deemed in                            satisfactory condition to undergo the procedure.                           After obtaining informed consent, the colonoscope                            was passed under direct vision. Throughout the                            procedure,  the patient's blood pressure, pulse, and                            oxygen saturations were monitored continuously. The                            [id number] scope was introduced through                            the anus and advanced to the the ileocolonic                            anastomosis. The terminal ileum was photographed.                            The colonoscopy was somewhat difficult due to a                            tortuous colon. Successful completion of the                            procedure was aided by using manual pressure. The                            quality of the bowel preparation was excellent. Scope In: 2:09:54 PM Scope Out: 2:31:37 PM Scope Withdrawal Time: 0 hours 16 minutes 39 seconds  Total Procedure Duration: 0 hours 21 minutes 43 seconds  Findings:      A 12 mm polyp was found in the transverse colon mid transverse colon.       The polyp was sessile. The polyp was removed with a hot snare. Resection       and retrieval were complete.      Four sessile polyps were found in the rectum and sigmoid colon. The       polyps were 3 to 5 mm in size. These polyps were removed with a cold       biopsy forceps. Resection and retrieval were complete.      The neo-terminal ileum appeared normal.      Multiple small and large-mouthed diverticula were found in the sigmoid  colon, descending colon and transverse colon.      Non-bleeding internal hemorrhoids were found. The hemorrhoids were small. Impression:               - One 12 mm polyp in the transverse colon in the                            mid transverse colon, removed with a hot snare.                            Resected and retrieved.                           - Four 3 to 5 mm polyps in the rectum and in the                            sigmoid colon, removed with a cold biopsy forceps.                            Resected and retrieved.                           - The examined portion of the  ileum was normal. Moderate Sedation:      Moderate (conscious) sedation was administered by the endoscopy nurse       and supervised by the endoscopist. The following parameters were       monitored: oxygen saturation, heart rate, blood pressure, and response       to care. Total physician intraservice time was 33 minutes. Recommendation:           - Patient has a contact number available for                            emergencies. The signs and symptoms of potential                            delayed complications were discussed with the                            patient. Return to normal activities tomorrow.                            Written discharge instructions were provided to the                            patient.                           DRINK WATER TO KEEP YOUR URINE LIGHT YELLOW.                           NEEDS MRI OF THE KIDNEY                           - High fiber diet.                           -  Continue present medications.                           - Await pathology results.                           - Repeat colonoscopy in 1-3 year for surveillance. Procedure Code(s):        --- Professional ---                           [phone], Colonoscopy, flexible; with removal of                            tumor(s), polyp(s), or other lesion(s) by snare                            technique                           45380, 59, Colonoscopy, flexible; with biopsy,                            single or multiple                           99153, Moderate sedation services; each additional                            15 minutes intraservice time                           G0500, Moderate sedation services provided by the                            same physician or other qualified health care                            professional performing a gastrointestinal                            endoscopic service that sedation supports,                            requiring the presence of an  independent trained                            observer to assist in the monitoring of the                            patient's level of consciousness and physiological                            status; initial 15 minutes of intra-service time;                            patient age 60 years or older (additional time  may                            be reported with [phone], as appropriate) Diagnosis Code(s):        --- Professional ---                           Z12.11, Encounter for screening for malignant                            neoplasm of colon                           D12.3, Benign neoplasm of transverse colon (hepatic                            flexure or splenic flexure)                           K62.1, Rectal polyp                           D12.5, Benign neoplasm of sigmoid colon CPT copyright 2016 [employer]. All rights reserved. The codes documented in this report are preliminary and upon coder review may  be revised to meet current compliance requirements. Barney Drain, MD Barney Drain, MD 01/20/2016 2:50:00 PM This report has been signed electronically. Number of Addenda: 0

## 2016-01-20 NOTE — H&P (Signed)
  Primary Care Physician:  Elyn Peers, MD Primary Gastroenterologist:  Dr. Oneida Alar  Pre-Procedure History & Physical: HPI:  Madison [name] is a 65 y.o. female here for [place].  Past Medical History  Diagnosis Date  . Anxiety     Past Surgical History  Procedure Laterality Date  . Cholecystectomy    . Tubal ligation      Prior to Admission medications   Medication Sig Start Date End Date Taking? Authorizing Provider  gabapentin (NEURONTIN) 100 MG capsule Take 100-200 mg by mouth 2 (two) times daily. Pt takes one tablet in the AM and 2 tablets at bedtime   Yes Historical Provider, MD  Multiple Vitamin (MULTIVITAMIN) tablet Take 1 tablet by mouth daily.   Yes Historical Provider, MD  OLANZapine (ZYPREXA) 20 MG tablet Take 20 mg by mouth at bedtime.   Yes Historical Provider, MD  OLANZapine (ZYPREXA) 5 MG tablet Take 5 mg by mouth at bedtime.   Yes Historical Provider, MD  thiamine (VITAMIN B-1) 50 MG tablet Take 50 mg by mouth daily.   Yes Historical Provider, MD    Allergies as of 01/05/2016  . (No Known Allergies)    Family History  Problem Relation Age of Onset  . Alzheimer's disease Mother   . Cancer Father   . Kidney failure Sister     Social History   Social History  . Marital Status: Single    Spouse Name: N/A  . Number of Children: N/A  . Years of Education: N/A   Occupational History  . Not on file.   Social History Main Topics  . Smoking status: Never Smoker   . Smokeless tobacco: Not on file  . Alcohol Use: No  . Drug Use: No  . Sexual Activity: Not on file   Other Topics Concern  . Not on file   Social History Narrative    Review of Systems: See HPI, otherwise negative ROS   Physical Exam: BP 156/68 mmHg  Pulse 71  Temp(Src) 98.6 F (37 C) (Oral)  Resp 20  Ht 5\' 4"  (1.626 m)  Wt 132 lb (59.875 kg)  BMI 22.65 kg/m2  SpO2 99% General:   Alert,  pleasant and cooperative in NAD Head:  Normocephalic and  atraumatic. Neck:  Supple; Lungs:  Clear throughout to auscultation.    Heart:  Regular rate and rhythm. Abdomen:  Soft, nontender and nondistended. Normal bowel sounds, without guarding, and without rebound.   Neurologic:  Alert and  oriented x4;  grossly normal neurologically.  Impression/Plan:     SCREENING  Plan:  1. TCS TODAY

## 2016-01-20 NOTE — Discharge Instructions (Signed)
You have small internal hemorrhoids and diverticulosis IN YOUR COLON. YOU HAD FIVE POLYPS REMOVED FROM.   DRINK WATER TO KEEP YOUR URINE LIGHT YELLOW.  FOLLOW A HIGH FIBER DIET. AVOID ITEMS THAT CAUSE BLOATING. See info below.  YOUR BIOPSY RESULTS WILL BE AVAILABLE IN MY CHART MAY 2 AND MY OFFICE WILL CONTACT YOU IN 10-14 DAYS WITH YOUR RESULTS.   Next colonoscopy in 1-3 years.  Colonoscopy Care After Read the instructions outlined below and refer to this sheet in the next week. These discharge instructions provide you with general information on caring for yourself after you leave the hospital. While your treatment has been planned according to the most current medical practices available, unavoidable complications occasionally occur. If you have any problems or questions after discharge, call DR. Mearle Drew, [phone].  ACTIVITY  You may resume your regular activity, but move at a slower pace for the next 24 hours.   Take frequent rest periods for the next 24 hours.   Walking will help get rid of the air and reduce the bloated feeling in your belly (abdomen).   No driving for 24 hours (because of the medicine (anesthesia) used during the test).   You may shower.   Do not sign any important legal documents or operate any machinery for 24 hours (because of the anesthesia used during the test).    NUTRITION  Drink plenty of fluids.   You may resume your normal diet as instructed by your doctor.   Begin with a light meal and progress to your normal diet. Heavy or fried foods are harder to digest and may make you feel sick to your stomach (nauseated).   Avoid alcoholic beverages for 24 hours or as instructed.    MEDICATIONS  You may resume your normal medications.   WHAT YOU CAN EXPECT TODAY  Some feelings of bloating in the abdomen.   Passage of more gas than usual.   Spotting of blood in your stool or on the toilet paper  .  IF YOU HAD POLYPS REMOVED DURING THE  COLONOSCOPY:  Eat a soft diet IF YOU HAVE NAUSEA, BLOATING, ABDOMINAL PAIN, OR VOMITING.    FINDING OUT THE RESULTS OF YOUR TEST Not all test results are available during your visit. DR. Oneida Alar WILL CALL YOU WITHIN 14 DAYS OF YOUR PROCEDUE WITH YOUR RESULTS. Do not assume everything is normal if you have not heard from DR. Kahne Helfand, CALL HER OFFICE AT [phone].  SEEK IMMEDIATE MEDICAL ATTENTION AND CALL THE OFFICE: [phone] IF:  You have more than a spotting of blood in your stool.   Your belly is swollen (abdominal distention).   You are nauseated or vomiting.   You have a temperature over 101F.   You have abdominal pain or discomfort that is severe or gets worse throughout the day.  High-Fiber Diet A high-fiber diet changes your normal diet to include more whole grains, legumes, fruits, and vegetables. Changes in the diet involve replacing refined carbohydrates with unrefined foods. The calorie level of the diet is essentially unchanged. The Dietary Reference Intake (recommended amount) for adult males is 38 grams per day. For adult females, it is 25 grams per day. Pregnant and lactating women should consume 28 grams of fiber per day. Fiber is the intact part of a plant that is not broken down during digestion. Functional fiber is fiber that has been isolated from the plant to provide a beneficial effect in the body.  PURPOSE  Increase stool bulk.  Ease and regulate bowel movements.   Lower cholesterol.  REDUCE RISK OF COLON CANCER  INDICATIONS THAT YOU NEED MORE FIBER  Constipation and hemorrhoids.   Uncomplicated diverticulosis (intestine condition) and irritable bowel syndrome.   Weight management.   As a protective measure against hardening of the arteries (atherosclerosis), diabetes, and cancer.   GUIDELINES FOR INCREASING FIBER IN THE DIET  Start adding fiber to the diet slowly. A gradual increase of about 5 more grams (2 slices of whole-wheat bread, 2  servings of most fruits or vegetables, or 1 bowl of high-fiber cereal) per day is best. Too rapid an increase in fiber may result in constipation, flatulence, and bloating.   Drink enough water and fluids to keep your urine clear or pale yellow. Water, juice, or caffeine-free drinks are recommended. Not drinking enough fluid may cause constipation.   Eat a variety of high-fiber foods rather than one type of fiber.   Try to increase your intake of fiber through using high-fiber foods rather than fiber pills or supplements that contain small amounts of fiber.   The goal is to change the types of food eaten. Do not supplement your present diet with high-fiber foods, but replace foods in your present diet.   INCLUDE A VARIETY OF FIBER SOURCES  Replace refined and processed grains with whole grains, canned fruits with fresh fruits, and incorporate other fiber sources. White rice, white breads, and most bakery goods contain little or no fiber.   Brown whole-grain rice, buckwheat oats, and many fruits and vegetables are all good sources of fiber. These include: broccoli, Brussels sprouts, cabbage, cauliflower, beets, sweet potatoes, white potatoes (skin on), carrots, tomatoes, eggplant, squash, berries, fresh fruits, and dried fruits.   Cereals appear to be the richest source of fiber. Cereal fiber is found in whole grains and bran. Bran is the fiber-rich outer coat of cereal grain, which is largely removed in refining. In whole-grain cereals, the bran remains. In breakfast cereals, the largest amount of fiber is found in those with "bran" in their names. The fiber content is sometimes indicated on the label.   You may need to include additional fruits and vegetables each day.   In baking, for 1 cup white flour, you may use the following substitutions:   1 cup whole-wheat flour minus 2 tablespoons.   1/2 cup white flour plus 1/2 cup whole-wheat flour.   Polyps, Colon  A polyp is extra tissue that  grows inside your body. Colon polyps grow in the large intestine. The large intestine, also called the colon, is part of your digestive system. It is a long, hollow tube at the end of your digestive tract where your body makes and stores stool. Most polyps are not dangerous. They are benign. This means they are not cancerous. But over time, some types of polyps can turn into cancer. Polyps that are smaller than a pea are usually not harmful. But larger polyps could someday become or may already be cancerous. To be safe, doctors remove all polyps and test them.   PREVENTION There is not one sure way to prevent polyps. You might be able to lower your risk of getting them if you:  Eat more fruits and vegetables and less fatty food.   Do not smoke.   Avoid alcohol.   Exercise every day.   Lose weight if you are overweight.   Eating more calcium and folate can also lower your risk of getting polyps. Some foods that are rich in  calcium are milk, cheese, and broccoli. Some foods that are rich in folate are chickpeas, kidney beans, and spinach.    Diverticulosis Diverticulosis is a common condition that develops when small pouches (diverticula) form in the wall of the colon. The risk of diverticulosis increases with age. It happens more often in people who eat a low-fiber diet. Most individuals with diverticulosis have no symptoms. Those individuals with symptoms usually experience belly (abdominal) pain, constipation, or loose stools (diarrhea).  HOME CARE INSTRUCTIONS  Increase the amount of fiber in your diet as directed by your caregiver or dietician. This may reduce symptoms of diverticulosis.   Drink at least 6 to 8 glasses of water each day to prevent constipation.   Try not to strain when you have a bowel movement.   Avoiding nuts and seeds to prevent complications is NOT NECESSARY.   FOODS HAVING HIGH FIBER CONTENT INCLUDE:  Fruits. Apple, peach, pear, tangerine, raisins, prunes.     Vegetables. Brussels sprouts, asparagus, broccoli, cabbage, carrot, cauliflower, romaine lettuce, spinach, summer squash, tomato, winter squash, zucchini.   Starchy Vegetables. Baked beans, kidney beans, lima beans, split peas, lentils, potatoes (with skin).   Grains. Whole wheat bread, brown rice, bran flake cereal, plain oatmeal, white rice, shredded wheat, bran muffins.   SEEK IMMEDIATE MEDICAL CARE IF:  You develop increasing pain or severe bloating.   You have an oral temperature above 101F.   You develop vomiting or bowel movements that are bloody or black.   Hemorrhoids Hemorrhoids are dilated (enlarged) veins around the rectum. Sometimes clots will form in the veins. This makes them swollen and painful. These are called thrombosed hemorrhoids. Causes of hemorrhoids include:  Constipation.   Straining to have a bowel movement.   HEAVY LIFTING  HOME CARE INSTRUCTIONS  Eat a well balanced diet and drink 6 to 8 glasses of water every day to avoid constipation. You may also use a bulk laxative.   Avoid straining to have bowel movements.   Keep anal area dry and clean.   Do not use a donut shaped pillow or sit on the toilet for long periods. This increases blood pooling and pain.   Move your bowels when your body has the urge; this will require less straining and will decrease pain and pressure.

## 2016-01-20 NOTE — Telephone Encounter (Signed)
HER BLOOD COUNT AND PLTS ARE NORMAL. SHE HAS A COMPLEX CYST ON THE  LEFT KIDNEY. SHE NEEDS AN MRI OF HER KIDNEY. WE WILL DISCUSS IT TODAY IN ENDO.

## 2016-01-25 ENCOUNTER — Encounter (HOSPITAL_COMMUNITY): Payer: Self-pay | Admitting: Gastroenterology

## 2016-01-27 ENCOUNTER — Telehealth: Payer: Self-pay | Admitting: Gastroenterology

## 2016-01-27 NOTE — Telephone Encounter (Signed)
Pt had colonoscopy last Friday, she was checking to see if her results were available. [id number]

## 2016-01-31 ENCOUNTER — Encounter: Payer: Self-pay | Admitting: Gastroenterology

## 2016-01-31 NOTE — Telephone Encounter (Signed)
Pt is aware I will call her when Dr. Oneida Alar sends me the report to call.

## 2016-01-31 NOTE — Telephone Encounter (Signed)
Please call pt. She had ONE simple adenoma  AND FOUR HYPERPLASTIC POLYPS REMOVED.    DRINK WATER TO KEEP YOUR URINE LIGHT YELLOW. FOLLOW A HIGH FIBER DIET. AVOID ITEMS THAT CAUSE BLOATING.  Next colonoscopy in 3 years.

## 2016-02-01 NOTE — Telephone Encounter (Signed)
Pt is aware of results. 

## 2016-02-01 NOTE — Telephone Encounter (Signed)
Reminder in epic °

## 2016-04-12 ENCOUNTER — Other Ambulatory Visit (HOSPITAL_COMMUNITY): Payer: Self-pay | Admitting: Family Medicine

## 2016-04-12 ENCOUNTER — Ambulatory Visit (HOSPITAL_COMMUNITY)
Admission: RE | Admit: 2016-04-12 | Discharge: 2016-04-12 | Disposition: A | Discharge status: Home / Self Care | Payer: Medicare Other | Source: Ambulatory Visit | Attending: Family Medicine | Admitting: Family Medicine

## 2016-04-12 DIAGNOSIS — Z09 Encounter for follow-up examination after completed treatment for conditions other than malignant neoplasm: Secondary | ICD-10-CM | POA: Insufficient documentation

## 2016-04-12 DIAGNOSIS — Z8701 Personal history of pneumonia (recurrent): Secondary | ICD-10-CM | POA: Insufficient documentation

## 2016-04-12 DIAGNOSIS — J690 Pneumonitis due to inhalation of food and vomit: Secondary | ICD-10-CM

## 2016-04-12 DIAGNOSIS — J90 Pleural effusion, not elsewhere classified: Secondary | ICD-10-CM | POA: Insufficient documentation

## 2016-04-12 DIAGNOSIS — J189 Pneumonia, unspecified organism: Secondary | ICD-10-CM | POA: Diagnosis present

## 2016-04-15 ENCOUNTER — Emergency Department (HOSPITAL_COMMUNITY): Payer: Medicare Other

## 2016-04-15 ENCOUNTER — Encounter (HOSPITAL_COMMUNITY): Payer: Self-pay | Admitting: Emergency Medicine

## 2016-04-15 ENCOUNTER — Inpatient Hospital Stay (HOSPITAL_COMMUNITY)
Admission: EM | Admit: 2016-04-15 | Discharge: 2016-04-18 | DRG: 640 | Disposition: A | Discharge status: Home Health | Payer: Medicare Other | Attending: Internal Medicine | Admitting: Internal Medicine

## 2016-04-15 DIAGNOSIS — R410 Disorientation, unspecified: Secondary | ICD-10-CM

## 2016-04-15 DIAGNOSIS — I129 Hypertensive chronic kidney disease with stage 1 through stage 4 chronic kidney disease, or unspecified chronic kidney disease: Secondary | ICD-10-CM | POA: Diagnosis present

## 2016-04-15 DIAGNOSIS — N184 Chronic kidney disease, stage 4 (severe): Secondary | ICD-10-CM | POA: Diagnosis present

## 2016-04-15 DIAGNOSIS — Z809 Family history of malignant neoplasm, unspecified: Secondary | ICD-10-CM

## 2016-04-15 DIAGNOSIS — N179 Acute kidney failure, unspecified: Secondary | ICD-10-CM | POA: Diagnosis not present

## 2016-04-15 DIAGNOSIS — D329 Benign neoplasm of meninges, unspecified: Secondary | ICD-10-CM | POA: Diagnosis present

## 2016-04-15 DIAGNOSIS — F23 Brief psychotic disorder: Secondary | ICD-10-CM | POA: Diagnosis present

## 2016-04-15 DIAGNOSIS — D649 Anemia, unspecified: Secondary | ICD-10-CM | POA: Diagnosis present

## 2016-04-15 DIAGNOSIS — E870 Hyperosmolality and hypernatremia: Secondary | ICD-10-CM | POA: Diagnosis not present

## 2016-04-15 DIAGNOSIS — E871 Hypo-osmolality and hyponatremia: Principal | ICD-10-CM | POA: Diagnosis present

## 2016-04-15 DIAGNOSIS — Z8701 Personal history of pneumonia (recurrent): Secondary | ICD-10-CM

## 2016-04-15 DIAGNOSIS — F29 Unspecified psychosis not due to a substance or known physiological condition: Secondary | ICD-10-CM | POA: Diagnosis not present

## 2016-04-15 DIAGNOSIS — Z841 Family history of disorders of kidney and ureter: Secondary | ICD-10-CM

## 2016-04-15 DIAGNOSIS — F209 Schizophrenia, unspecified: Secondary | ICD-10-CM | POA: Diagnosis present

## 2016-04-15 DIAGNOSIS — N183 Chronic kidney disease, stage 3 unspecified: Secondary | ICD-10-CM | POA: Diagnosis present

## 2016-04-15 DIAGNOSIS — Z820 Family history of epilepsy and other diseases of the nervous system: Secondary | ICD-10-CM

## 2016-04-15 DIAGNOSIS — G9340 Encephalopathy, unspecified: Secondary | ICD-10-CM | POA: Diagnosis present

## 2016-04-15 DIAGNOSIS — N289 Disorder of kidney and ureter, unspecified: Secondary | ICD-10-CM

## 2016-04-15 DIAGNOSIS — F419 Anxiety disorder, unspecified: Secondary | ICD-10-CM | POA: Diagnosis present

## 2016-04-15 HISTORY — DX: Schizophrenia, unspecified: F20.9

## 2016-04-15 HISTORY — DX: Polyneuropathy, unspecified: G62.9

## 2016-04-15 LAB — URINALYSIS, ROUTINE W REFLEX MICROSCOPIC
Bilirubin Urine: NEGATIVE
Glucose, UA: NEGATIVE mg/dL
Ketones, ur: NEGATIVE mg/dL
Nitrite: NEGATIVE
Protein, ur: 100 mg/dL — AB
Specific Gravity, Urine: <1.005 — ABNORMAL LOW (ref 1.005–1.030)
pH: 6 (ref 5.0–8.0)

## 2016-04-15 LAB — CBC WITH DIFFERENTIAL/PLATELET
Basophils Absolute: 0 K/uL (ref 0.0–0.1)
Basophils Relative: 0 %
Eosinophils Absolute: 0.1 K/uL (ref 0.0–0.7)
Eosinophils Relative: 2 %
HCT: 29 % — ABNORMAL LOW (ref 36.0–46.0)
Hemoglobin: 10 g/dL — ABNORMAL LOW (ref 12.0–15.0)
Lymphocytes Relative: 22 %
Lymphs Abs: 1.1 K/uL (ref 0.7–4.0)
MCH: 30 pg (ref 26.0–34.0)
MCHC: 34.5 g/dL (ref 30.0–36.0)
MCV: 87.1 fL (ref 78.0–100.0)
Monocytes Absolute: 0.6 K/uL (ref 0.1–1.0)
Monocytes Relative: 12 %
Neutro Abs: 3 K/uL (ref 1.7–7.7)
Neutrophils Relative %: 64 %
Platelets: 194 K/uL (ref 150–400)
RBC: 3.33 MIL/uL — ABNORMAL LOW (ref 3.87–5.11)
RDW: 13.4 % (ref 11.5–15.5)
WBC: 4.7 K/uL (ref 4.0–10.5)

## 2016-04-15 LAB — URINE MICROSCOPIC-ADD ON

## 2016-04-15 LAB — RAPID URINE DRUG SCREEN, HOSP PERFORMED
AMPHETAMINES: NOT DETECTED
BARBITURATES: NOT DETECTED
Benzodiazepines: NOT DETECTED
Cocaine: NOT DETECTED
Opiates: POSITIVE — AB
Tetrahydrocannabinol: NOT DETECTED

## 2016-04-15 LAB — COMPREHENSIVE METABOLIC PANEL WITH GFR
ALT: 13 U/L — ABNORMAL LOW (ref 14–54)
AST: 27 U/L (ref 15–41)
Albumin: 3.4 g/dL — ABNORMAL LOW (ref 3.5–5.0)
Alkaline Phosphatase: 60 U/L (ref 38–126)
Anion gap: 7 (ref 5–15)
BUN: 58 mg/dL — ABNORMAL HIGH (ref 6–20)
CO2: 21 mmol/L — ABNORMAL LOW (ref 22–32)
Calcium: 8.5 mg/dL — ABNORMAL LOW (ref 8.9–10.3)
Chloride: 97 mmol/L — ABNORMAL LOW (ref 101–111)
Creatinine, Ser: 3.02 mg/dL — ABNORMAL HIGH (ref 0.44–1.00)
GFR calc Af Amer: 17 mL/min — ABNORMAL LOW
GFR calc non Af Amer: 15 mL/min — ABNORMAL LOW
Glucose, Bld: 104 mg/dL — ABNORMAL HIGH (ref 65–99)
Potassium: 3.6 mmol/L (ref 3.5–5.1)
Sodium: 125 mmol/L — ABNORMAL LOW (ref 135–145)
Total Bilirubin: 0.6 mg/dL (ref 0.3–1.2)
Total Protein: 6.4 g/dL — ABNORMAL LOW (ref 6.5–8.1)

## 2016-04-15 LAB — ETHANOL

## 2016-04-15 MED ORDER — ADULT MULTIVITAMIN W/MINERALS CH
1.0000 | ORAL_TABLET | Freq: Every day | ORAL | Status: DC
  Administered 2016-04-16 – 2016-04-17 (×2): 1 via ORAL
  Filled 2016-04-15 (×3): qty 1

## 2016-04-15 MED ORDER — SODIUM CHLORIDE 0.9 % IV BOLUS (SEPSIS)
500.0000 mL | Freq: Once | INTRAVENOUS | Status: AC
  Administered 2016-04-15: 500 mL via INTRAVENOUS

## 2016-04-15 MED ORDER — HEPARIN SODIUM (PORCINE) 5000 UNIT/ML IJ SOLN
5000.0000 [IU] | Freq: Three times a day (TID) | INTRAMUSCULAR | Status: DC
  Administered 2016-04-15 – 2016-04-18 (×7): 5000 [IU] via SUBCUTANEOUS
  Filled 2016-04-15 (×8): qty 1

## 2016-04-15 MED ORDER — OLANZAPINE 5 MG PO TABS
20.0000 mg | ORAL_TABLET | Freq: Every day | ORAL | Status: DC
  Administered 2016-04-15 – 2016-04-17 (×3): 20 mg via ORAL
  Filled 2016-04-15 (×3): qty 4

## 2016-04-15 MED ORDER — DEXTROSE-NACL 5-0.9 % IV SOLN
INTRAVENOUS | Status: DC
  Administered 2016-04-15 – 2016-04-16 (×2): via INTRAVENOUS

## 2016-04-15 MED ORDER — VITAMIN B-1 100 MG PO TABS
50.0000 mg | ORAL_TABLET | Freq: Every day | ORAL | Status: DC
  Administered 2016-04-16 – 2016-04-17 (×2): 50 mg via ORAL
  Filled 2016-04-15 (×3): qty 1

## 2016-04-15 MED ORDER — OLANZAPINE 5 MG PO TABS
5.0000 mg | ORAL_TABLET | Freq: Every day | ORAL | Status: DC
  Administered 2016-04-15 – 2016-04-17 (×3): 5 mg via ORAL
  Filled 2016-04-15 (×3): qty 1

## 2016-04-15 NOTE — Plan of Care (Signed)
Problem: Safety: Goal: Ability to remain free from injury will improve Outcome: Progressing Denies falls

## 2016-04-15 NOTE — H&P (Signed)
History and Physical    Madison [name] [id number] DOB: Nov 01, 1950 DOA: 04/15/2016  Referring MD/NP/PA: Pattricia Boss, MD PCP: Elyn Peers, MD  Outpatient Specialists:  Patient coming from:   Chief Complaint: Altered mental status  HPI: Madison [name] is a 65 y.o. female with medical history significant of schizophrenia, renal insuffiency, anxiety disorder, presented with complaints of increasing confusion over the past week. She denies any pain or physical issues. She reports living alone but is walking distance from family and stays with them at night. Noted to have completed a course of abx recently for pneumonia. She was brought into the hospital for increasing confusion.  Her meds were given by her sister, and she admitted to having been inpatient psychiatric hospital for a month previously.  She admitted to having non command auditory hallucination.  No homocidal or suicidal ideation, and no intention to hurt herself.  She denied any substance abuse.   ED Course: Sodium 125; Creatinine 3.02; CT head and CXR unremarkable. Patient started on IVF and referred for admission.   Review of Systems: As per HPI otherwise 10 point review of systems negative.    Past Medical History:  Diagnosis Date  . Anxiety   . Neuropathy ([place])   . Renal disorder   . Schizophrenia [facility])     Past Surgical History:  Procedure Laterality Date  . CHOLECYSTECTOMY    . COLONOSCOPY N/A 01/20/2016   Procedure: COLONOSCOPY;  Surgeon: [name], MD;  [facility];  Service: Endoscopy;  Laterality: N/A;  1:15 PM-moved to 130 Office to notify  . HEMICOLECTOMY Right   . TUBAL LIGATION       reports that she has never smoked. She has never used smokeless tobacco. She reports that she does not drink alcohol or use drugs.  No Known Allergies  Family History  Problem Relation Age of Onset  . Alzheimer's disease Mother   . Cancer Father   . Kidney failure Sister     Prior to Admission  medications   Medication Sig Start Date End Date Taking? Authorizing Provider  furosemide (LASIX) 20 MG tablet Take 20 mg by mouth daily.  03/28/16  Yes Historical Provider, MD  gabapentin (NEURONTIN) 100 MG capsule Take 100-200 mg by mouth 2 (two) times daily. Pt takes one tablet in the AM and 2 tablets at bedtime   Yes Historical Provider, MD  guaiFENesin-codeine (CHERATUSSIN AC) 100-10 MG/5ML syrup Take 5-10 mLs by mouth 3 (three) times daily as needed for cough.   Yes Historical Provider, MD  Multiple Vitamin (MULTIVITAMIN) tablet Take 1 tablet by mouth daily.   Yes Historical Provider, MD  OLANZapine (ZYPREXA) 20 MG tablet Take 20 mg by mouth at bedtime.   Yes Historical Provider, MD  OLANZapine (ZYPREXA) 5 MG tablet Take 5 mg by mouth at bedtime.   Yes Historical Provider, MD  thiamine (VITAMIN B-1) 50 MG tablet Take 50 mg by mouth daily.   Yes Historical Provider, MD  amoxicillin-clavulanate (AUGMENTIN) 500-125 MG tablet Take 1 tablet by mouth 2 (two) times daily. 10 day course starting on 04/02/2016 03/29/16   Historical Provider, MD    Physical Exam: Vitals:   04/15/16 1930 04/15/16 1945 04/15/16 2015 04/15/16 2030  BP: 161/77   162/82  Pulse: 75 74 77   Resp:      Temp:      TempSrc:      SpO2: 98% 99% 99% 100%  Weight:      Height:  Vitals:   04/15/16 1930 04/15/16 1945 04/15/16 2015 04/15/16 2030  BP: 161/77   162/82  Pulse: 75 74 77   Resp:      Temp:      TempSrc:      SpO2: 98% 99% 99% 100%  Weight:      Height:       Constitutional: NAD, calm, comfortable Eyes: PERRL, lids and conjunctivae normal ENMT: Mucous membranes are moist. Posterior pharynx clear of any exudate or lesions.Normal dentition.  Neck: normal, supple, no masses, no thyromegaly Respiratory: clear to auscultation bilaterally, no wheezing, no crackles. Normal respiratory effort. No accessory muscle use.  Cardiovascular: Regular rate and rhythm, no murmurs / rubs / gallops. No extremity  edema. .  Abdomen: no tenderness, no masses palpated. No hepatosplenomegaly. Bowel sounds positive.  Musculoskeletal: no clubbing / cyanosis. No joint deformity upper and lower extremities. Skin: no rashes, lesions, ulcers. No induration Neurologic: CN 2-12 grossly intact. Sensation intact, DTR normal. Strength 5/5 in all 4.  Psychiatric: Mild confusion. Oriented to self and place. Admitted to non command auditory hallucination.     Labs on Admission: I have personally reviewed following labs and imaging studies  CBC:  Recent Labs Lab 04/15/16 1913  WBC 4.7  NEUTROABS 3.0  HGB 10.0*  HCT 29.0*  MCV 87.1  PLT [id number]   Basic Metabolic Panel:  Recent Labs Lab 04/15/16 1913  NA 125*  K 3.6  CL 97*  CO2 21*  GLUCOSE 104*  BUN 58*  CREATININE 3.02*  CALCIUM 8.5*   GFR: Estimated Creatinine Clearance: 15.8 mL/min (by C-G formula based on SCr of 3.02 mg/dL). Liver Function Tests:  Recent Labs Lab 04/15/16 1913  AST 27  ALT 13*  ALKPHOS 60  BILITOT 0.6  PROT 6.4*  ALBUMIN 3.4*   Urine analysis:    Component Value Date/Time   COLORURINE YELLOW 04/15/2016 1752   APPEARANCEUR CLEAR 04/15/2016 1752   LABSPEC <1.005 (L) 04/15/2016 1752   PHURINE 6.0 04/15/2016 1752   GLUCOSEU NEGATIVE 04/15/2016 1752   HGBUR SMALL (A) 04/15/2016 1752   BILIRUBINUR NEGATIVE 04/15/2016 1752   KETONESUR NEGATIVE 04/15/2016 1752   PROTEINUR 100 (A) 04/15/2016 1752   NITRITE NEGATIVE 04/15/2016 1752   LEUKOCYTESUR LARGE (A) 04/15/2016 1752     Radiological Exams on Admission: Dg Chest 2 View  Result Date: 04/15/2016 CLINICAL DATA:  Cough and altered mental status. EXAM: CHEST  2 VIEW COMPARISON:  04/12/2016 and prior radiographs FINDINGS: Upper limits normal heart size noted. There is no evidence of focal airspace disease, pulmonary edema, suspicious pulmonary nodule/mass, pleural effusion, or pneumothorax. No acute bony abnormalities are identified. IMPRESSION: Upper limits of normal  heart size without evidence of active cardiopulmonary disease. Electronically Signed   By: Margarette Canada M.D.   On: 04/15/2016 18:20  Ct Head Wo Contrast  Result Date: 04/15/2016 CLINICAL DATA:  Increased confusion.  History of schizophrenia. EXAM: CT HEAD WITHOUT CONTRAST TECHNIQUE: Contiguous axial images were obtained from the base of the skull through the vertex without intravenous contrast. COMPARISON:  04/18/2009 FINDINGS: Brain: Similar findings of atrophy with centralized volume loss with commensurate ex vacuo dilatation of the ventricular system. Gray-white differentiation is maintained without CT evidence of acute large territory infarct. No intraparenchymal or extra-axial hemorrhage. No midline shift. Re- demonstrated exuberant calcifications about the midline of the falx with a suspected approximately 1.8 x 1.6 x 1.5 cm partially calcified extra-axial lesion about the right-side of the vertex of the falx (axial image 29,  series 2, coronal image 29, series 4, similar to the 03/2015 examination and again favored to represent a meningioma. No associated mass effect. No intraparenchymal mass. Vascular: No hyperdense vessel or unexpected calcification. Skull: No displaced calvarial fracture. Sinuses/Orbits: Underpneumatization the bilateral frontal sinuses. The remaining paranasal sinuses and mastoid air cells are normally aerated. No air-fluid levels. Other: Regional soft tissues appear normal. IMPRESSION: 1. Similar findings of centralized atrophy without acute intracranial process. 2. Similar findings of exuberant calcifications about the midline of the falx with a suspected approximately 1.8 cm meningioma arising from the right side of the vertex of the falx, similar to the 03/2015 examination and without associated mass effect. Electronically Signed   By: Sandi Mariscal M.D.   On: 04/15/2016 19:16   EKG: Independently reviewed.   Assessment/Plan  1. Acute psychosis superimposed on schizophrenia  with noncommand auditory hallucinations. Psych consult requested for the am. Follow-up B-12, and HIV and TSH.  She is not deemed dangerous to herself or others at this time.  2. Hypovolemic hyponatremia, continue IVF for sodium correction.   D/C diuretics.  Will follow Na.   3. AKI. continue IVF.  Follow Cr in the am.  She has no evidence of infection.  Consider renal US if not improving.    DVT prophylaxis: Heparin SQ Code Status: Full Family Communication: No family bedside Disposition Plan: Anticipate discharge in 24-48 hours.  Consults called: None Admission status: Admit to medical bed   Orvan Falconer, MD FACP.  [facility] If 7PM-7AM, please contact night-coverage [email or web address] Password [facility]  04/15/2016, 8:49 PM   By signing my name below, I, Rennis Harding, attest that this documentation has been prepared under the direction and in the presence of Orvan Falconer, MD. Electronically signed: Rennis Harding, Scribe. 04/15/16 8:50pm  I, Dr. Orvan Falconer, personally performed the services described in this documentation. All medical record entries made by the scribe were at my discretion and in my presence. Orvan Falconer, MD 04/15/16

## 2016-04-15 NOTE — ED Notes (Signed)
Patient taken to restroom via wheelchair.

## 2016-04-15 NOTE — ED Notes (Signed)
MD at bedside. 

## 2016-04-15 NOTE — ED Provider Notes (Signed)
[facility] DEPT Provider Note   CSN: [id number] Arrival date & time: 04/15/16  1633  First Provider Contact:  First MD Initiated Contact with Patient 04/15/16 1813        History   Chief Complaint Chief Complaint  Patient presents with  . Altered Mental Status    HPI Madison [name] is a 65 y.o. female.  HPI 65 year old female history of schizophrenia and renal insufficiency who presents today with increasing confusion over the past week. She has had a recent pneumonia and has completed her antibiotic course and is still taking some cough medicine intermittently. She is otherwise reportedly taking all her medicines as prescribed. She does drink a lot of water at baseline although it is hard to tell what she has been drinking recently. She has not had any reported vomiting or diarrhea. The confusion is reported to be generalized without any lateralized weakness or difficulty speaking. She denies any recent head injury. History is obtained initially from the patient but family is later present and abs to history. Past Medical History:  Diagnosis Date  . Anxiety   . Neuropathy ([place])   . Renal disorder   . Schizophrenia [facility])     Patient Active Problem List   Diagnosis Date Noted  . Special screening for malignant neoplasms, colon   . Thrombocytopenia ([place]) 01/05/2016  . Altered mental status 04/19/2015    Past Surgical History:  Procedure Laterality Date  . CHOLECYSTECTOMY    . COLONOSCOPY N/A 01/20/2016   Procedure: COLONOSCOPY;  Surgeon: [name], MD;  Location[facility];  Service: Endoscopy;  Laterality: N/A;  1:15 PM-moved to 130 Office to notify  . HEMICOLECTOMY Right   . TUBAL LIGATION      OB History    No data available       Home Medications    Prior to Admission medications   Medication Sig Start Date End Date Taking? Authorizing Provider  furosemide (LASIX) 20 MG tablet Take 20 mg by mouth daily.  03/28/16  Yes Historical Provider, MD    gabapentin (NEURONTIN) 100 MG capsule Take 100-200 mg by mouth 2 (two) times daily. Pt takes one tablet in the AM and 2 tablets at bedtime   Yes Historical Provider, MD  guaiFENesin-codeine (CHERATUSSIN AC) 100-10 MG/5ML syrup Take 5-10 mLs by mouth 3 (three) times daily as needed for cough.   Yes Historical Provider, MD  Multiple Vitamin (MULTIVITAMIN) tablet Take 1 tablet by mouth daily.   Yes Historical Provider, MD  OLANZapine (ZYPREXA) 20 MG tablet Take 20 mg by mouth at bedtime.   Yes Historical Provider, MD  OLANZapine (ZYPREXA) 5 MG tablet Take 5 mg by mouth at bedtime.   Yes Historical Provider, MD  thiamine (VITAMIN B-1) 50 MG tablet Take 50 mg by mouth daily.   Yes Historical Provider, MD  amoxicillin-clavulanate (AUGMENTIN) 500-125 MG tablet Take 1 tablet by mouth 2 (two) times daily. 10 day course starting on 04/02/2016 03/29/16   Historical Provider, MD    Family History Family History  Problem Relation Age of Onset  . Alzheimer's disease Mother   . Cancer Father   . Kidney failure Sister     Social History Social History  Substance Use Topics  . Smoking status: Never Smoker  . Smokeless tobacco: Never Used  . Alcohol use No     Allergies   Review of patient's allergies indicates no known allergies.   Review of Systems Review of Systems  All other systems reviewed and  are negative.    Physical Exam Updated Vital Signs BP 161/77   Pulse 74   Temp 98.6 F (37 C) (Rectal)   Resp 18   Ht 5\' 4"  (1.626 m)   Wt 59 kg   SpO2 99%   BMI 22.31 kg/m   Physical Exam  Constitutional: She is oriented to person, place, and time. She appears well-developed and well-nourished. No distress.  HENT:  Head: Normocephalic and atraumatic.  Right Ear: External ear normal.  Left Ear: External ear normal.  Nose: Nose normal.  Eyes: Conjunctivae and EOM are normal. Pupils are equal, round, and reactive to light.  Neck: Normal range of motion. Neck supple.  Cardiovascular:  Normal rate.   Pulmonary/Chest: Effort normal and breath sounds normal. No respiratory distress.  Abdominal: Soft. Bowel sounds are normal.  Musculoskeletal: Normal range of motion.  Neurological: She is alert and oriented to person, place, and time. She exhibits normal muscle tone. Coordination normal.  Skin: Skin is warm and dry. Capillary refill takes less than 2 seconds.  Psychiatric: She has a normal mood and affect. Her behavior is normal. Thought content normal.  Nursing note and vitals reviewed.    ED Treatments / Results  Labs (all labs ordered are listed, but only abnormal results are displayed) Labs Reviewed  CBC WITH DIFFERENTIAL/PLATELET - Abnormal; Notable for the following:       Result Value   RBC 3.33 (*)    Hemoglobin 10.0 (*)    HCT 29.0 (*)    All other components within normal limits  COMPREHENSIVE METABOLIC PANEL - Abnormal; Notable for the following:    Sodium 125 (*)    Chloride 97 (*)    CO2 21 (*)    Glucose, Bld 104 (*)    BUN 58 (*)    Creatinine, Ser 3.02 (*)    Calcium 8.5 (*)    Total Protein 6.4 (*)    Albumin 3.4 (*)    ALT 13 (*)    GFR calc non Af Amer 15 (*)    GFR calc Af Amer 17 (*)    All other components within normal limits  URINALYSIS, ROUTINE W REFLEX MICROSCOPIC (NOT AT [facility]) - Abnormal; Notable for the following:    Specific Gravity, Urine <1.005 (*)    Hgb urine dipstick SMALL (*)    Protein, ur 100 (*)    Leukocytes, UA LARGE (*)    All other components within normal limits  URINE RAPID DRUG SCREEN, HOSP PERFORMED - Abnormal; Notable for the following:    Opiates POSITIVE (*)    All other components within normal limits  URINE MICROSCOPIC-ADD ON - Abnormal; Notable for the following:    Squamous Epithelial / LPF 6-30 (*)    Bacteria, UA FEW (*)    All other components within normal limits  ETHANOL    EKG  EKG Interpretation  Date/Time:  Sunday April 15 2016 17:44:24 EDT Ventricular Rate:  76 PR Interval:    QRS  Duration: 102 QT Interval:  403 QTC Calculation: 454 R Axis:   14 Text Interpretation:  Normal sinus rhythm Confirmed by Skylan Lara MD, Yves Fodor ([id number]) on 04/15/2016 8:21:02 PM       Radiology Dg Chest 2 View  Result Date: 04/15/2016 CLINICAL DATA:  Cough and altered mental status. EXAM: CHEST  2 VIEW COMPARISON:  04/12/2016 and prior radiographs FINDINGS: Upper limits normal heart size noted. There is no evidence of focal airspace disease, pulmonary edema, suspicious pulmonary nodule/mass, pleural effusion,  or pneumothorax. No acute bony abnormalities are identified. IMPRESSION: Upper limits of normal heart size without evidence of active cardiopulmonary disease. Electronically Signed   By: Margarette Canada M.D.   On: 04/15/2016 18:20  Ct Head Wo Contrast  Result Date: 04/15/2016 CLINICAL DATA:  Increased confusion.  History of schizophrenia. EXAM: CT HEAD WITHOUT CONTRAST TECHNIQUE: Contiguous axial images were obtained from the base of the skull through the vertex without intravenous contrast. COMPARISON:  04/18/2009 FINDINGS: Brain: Similar findings of atrophy with centralized volume loss with commensurate ex vacuo dilatation of the ventricular system. Gray-white differentiation is maintained without CT evidence of acute large territory infarct. No intraparenchymal or extra-axial hemorrhage. No midline shift. Re- demonstrated exuberant calcifications about the midline of the falx with a suspected approximately 1.8 x 1.6 x 1.5 cm partially calcified extra-axial lesion about the right-side of the vertex of the falx (axial image 29, series 2, coronal image 29, series 4, similar to the 03/2015 examination and again favored to represent a meningioma. No associated mass effect. No intraparenchymal mass. Vascular: No hyperdense vessel or unexpected calcification. Skull: No displaced calvarial fracture. Sinuses/Orbits: Underpneumatization the bilateral frontal sinuses. The remaining paranasal sinuses and mastoid air  cells are normally aerated. No air-fluid levels. Other: Regional soft tissues appear normal. IMPRESSION: 1. Similar findings of centralized atrophy without acute intracranial process. 2. Similar findings of exuberant calcifications about the midline of the falx with a suspected approximately 1.8 cm meningioma arising from the right side of the vertex of the falx, similar to the 03/2015 examination and without associated mass effect. Electronically Signed   By: Sandi Mariscal M.D.   On: 04/15/2016 19:16   Procedures Procedures (including critical care time)  Medications Ordered in ED Medications  sodium chloride 0.9 % bolus 500 mL (not administered)     Initial Impression / Assessment and Plan / ED Course  I have reviewed the triage vital signs and the nursing notes.  Pertinent labs & imaging results that were available during my care of the patient were reviewed by me and considered in my medical decision making (see chart for details).  Clinical Course    Patient presents today with increased confusion and chronic hyponatremia and increasing renal insufficiency. Plan admission for sodium correction and iv hydration.Unclear chronicity but does not appear acute although last sodium  144 ( but one year ago).    Final Clinical Impressions(s) / ED Diagnoses   Final diagnoses:  Confusion  Hyponatremia  Renal insufficiency    New Prescriptions New Prescriptions   No medications on file     Pattricia Boss, MD 04/15/16 2109

## 2016-04-15 NOTE — ED Triage Notes (Signed)
Pt c/o feeling increasingly confused. Pt sister states has to be hospitalized for psychiatric problems recently but also has kidney problems. Pt denies SI/HI, but states she "feels like it is time for her to go back into the hospital".

## 2016-04-16 ENCOUNTER — Encounter (HOSPITAL_COMMUNITY): Payer: Self-pay | Admitting: Internal Medicine

## 2016-04-16 DIAGNOSIS — N183 Chronic kidney disease, stage 3 unspecified: Secondary | ICD-10-CM | POA: Diagnosis present

## 2016-04-16 DIAGNOSIS — D6489 Other specified anemias: Secondary | ICD-10-CM | POA: Diagnosis not present

## 2016-04-16 DIAGNOSIS — D329 Benign neoplasm of meninges, unspecified: Secondary | ICD-10-CM | POA: Diagnosis present

## 2016-04-16 DIAGNOSIS — D649 Anemia, unspecified: Secondary | ICD-10-CM | POA: Diagnosis present

## 2016-04-16 DIAGNOSIS — F29 Unspecified psychosis not due to a substance or known physiological condition: Secondary | ICD-10-CM | POA: Diagnosis not present

## 2016-04-16 DIAGNOSIS — R410 Disorientation, unspecified: Secondary | ICD-10-CM | POA: Diagnosis not present

## 2016-04-16 DIAGNOSIS — E871 Hypo-osmolality and hyponatremia: Secondary | ICD-10-CM | POA: Diagnosis not present

## 2016-04-16 DIAGNOSIS — N179 Acute kidney failure, unspecified: Secondary | ICD-10-CM | POA: Diagnosis not present

## 2016-04-16 LAB — COMPREHENSIVE METABOLIC PANEL
ALK PHOS: 50 U/L (ref 38–126)
ALT: 11 U/L — ABNORMAL LOW (ref 14–54)
ANION GAP: 8 (ref 5–15)
AST: 21 U/L (ref 15–41)
Albumin: 2.9 g/dL — ABNORMAL LOW (ref 3.5–5.0)
BUN: 53 mg/dL — ABNORMAL HIGH (ref 6–20)
CALCIUM: 8.8 mg/dL — AB (ref 8.9–10.3)
CO2: 23 mmol/L (ref 22–32)
Chloride: 108 mmol/L (ref 101–111)
Creatinine, Ser: 2.74 mg/dL — ABNORMAL HIGH (ref 0.44–1.00)
GFR, EST AFRICAN AMERICAN: 20 mL/min — AB (ref >60)
GFR, EST NON AFRICAN AMERICAN: 17 mL/min — AB (ref >60)
Glucose, Bld: 106 mg/dL — ABNORMAL HIGH (ref 65–99)
Potassium: 3.9 mmol/L (ref 3.5–5.1)
SODIUM: 139 mmol/L (ref 135–145)
Total Bilirubin: 0.2 mg/dL — ABNORMAL LOW (ref 0.3–1.2)
Total Protein: 5.8 g/dL — ABNORMAL LOW (ref 6.5–8.1)

## 2016-04-16 LAB — CBC
HCT: 27 % — ABNORMAL LOW (ref 36.0–46.0)
HEMOGLOBIN: 9.2 g/dL — AB (ref 12.0–15.0)
MCH: 30.2 pg (ref 26.0–34.0)
MCHC: 34.1 g/dL (ref 30.0–36.0)
MCV: 88.5 fL (ref 78.0–100.0)
PLATELETS: 207 10*3/uL (ref 150–400)
RBC: 3.05 MIL/uL — AB (ref 3.87–5.11)
RDW: 13.7 % (ref 11.5–15.5)
WBC: 3.2 10*3/uL — AB (ref 4.0–10.5)

## 2016-04-16 LAB — VITAMIN B12: VITAMIN B 12: 1021 pg/mL — AB (ref 180–914)

## 2016-04-16 LAB — TSH: TSH: 1.937 u[IU]/mL (ref 0.350–4.500)

## 2016-04-16 LAB — MRSA PCR SCREENING: MRSA BY PCR: POSITIVE — AB

## 2016-04-16 MED ORDER — POLYVINYL ALCOHOL 1.4 % OP SOLN: 1.0000 [drp] | OPHTHALMIC | Status: DC | PRN

## 2016-04-16 MED ORDER — CHLORHEXIDINE GLUCONATE CLOTH 2 % EX PADS
6.0000 | MEDICATED_PAD | Freq: Every day | CUTANEOUS | Status: DC
  Administered 2016-04-16 – 2016-04-18 (×3): 6 via TOPICAL

## 2016-04-16 MED ORDER — MUPIROCIN 2 % EX OINT
1.0000 "application " | TOPICAL_OINTMENT | Freq: Two times a day (BID) | CUTANEOUS | Status: DC
  Administered 2016-04-16 – 2016-04-17 (×4): 1 via NASAL
  Filled 2016-04-16 (×2): qty 22

## 2016-04-16 NOTE — Care Management Obs Status (Signed)
[facility] NOTIFICATION   Patient Details  Name: Madison [name] MRN: [id number] Date of Birth: 1950-12-07   Medicare Observation Status Notification Given:  Yes    Ariell Gunnels, Chauncey Reading, RN 04/16/2016, 12:03 PM

## 2016-04-16 NOTE — Care Management Note (Signed)
Case Management Note  Patient Details  Name: Madison [name] MRN: [id number] Date of Birth: 09/14/1951  Subjective/Objective:                  Patient is from home aloine, however has close by family supports. Stays with her mom at night who is within walking distance. Has PCP, counselor and insurance in place. Reports no issues.  Action/Plan: DC home with self care. No CM needs identified.    Expected Discharge Date:                  Expected Discharge Plan:  Home/Self Care  In-House Referral:  NA  Discharge planning Services  CM Consult  Post Acute Care Choice:  NA Choice offered to:  NA  DME Arranged:    DME Agency:     HH Arranged:    HH Agency:     Status of Service:  Completed, signed off  If discussed at [employer] of Stay Meetings, dates discussed:    Additional Comments:  Madison [name], Chauncey Reading, RN 04/16/2016, 12:06 PM

## 2016-04-16 NOTE — Progress Notes (Signed)
Spoke with Hospitalist, Dr. Caryn Section, okay to discontinue 1:1 observation, but continue monitor patient closely and frequently.

## 2016-04-16 NOTE — Progress Notes (Signed)
CRITICAL VALUE ALERT  Critical value received:  Positive MRSA  Date of notification:  04/16/2016  Time of notification:  04:10  Critical value read back: yes  Nurse who received alert:  Stephannie Peters  MD notified (1st page):  Protocol Followed  Time of first page:  04:16  MD notified (2nd page):  Time of second page:  Responding MD:  Standing Orders Protocol followed  Time MD responded:  04:16

## 2016-04-16 NOTE — Progress Notes (Signed)
PROGRESS NOTE    Madison [name]  [id number] DOB: 01-17-1951 DOA: 04/15/2016 PCP: Elyn Peers, MD    Brief Narrative:  Patient is a 65 year old woman with a history of schizophrenia, chronic kidney disease, and chronic anxiety, who presented to the ED on 04/15/2016 with report of confusion and hallucinations. She recently completed a course of antibiotics for pneumonia. Apparently, her medications are given by her sister. In the ED, she was afebrile and hemodynamically stable. CT of her head revealed no acute findings, but with a suspicion of a 1.8 cm meningioma arising from the right side of the vertex of the falx, similar to exam 03/2015. Her chest x-ray revealed no active disease. Her lab data were significant for serum sodium of 125, creatinine of 3.02, and hemoglobin of 10.0. Urine drug screen was positive for opiates. Alcohol level was negative. Urinalysis was unremarkable. She was admitted for further evaluation and management.  Assessment & Plan:   Principal Problem:   Acute psychosis Active Problems:   Hyponatremia   AKI (acute kidney injury) ([place])   CKD (chronic kidney disease) stage 3, GFR 30-59 ml/min   Anemia due to other cause   Meningioma ([place])   1. Acute encephalopathy/query acute psychosis. The patient has underlying schizophrenia and is treated chronically with Zyprexa. She was restarted on Zyprexa. On follow-up exam this morning, she denies auditory or visual hallucinations. She has no complaints of headaches. -For further evaluation, number studies were ordered. A TSH was 1.9. HIV and vitamin B12 levels are pending. -The etiology of her acute confusion was likely hyponatremia in the setting of schizophrenia. However, HIV disease and hypothyroidism will need to be ruled out. -Psychiatry consulted. Psychiatry nurse practitioner recommended continuation of her Zyprexa and follow-up with her psychiatrist. She did not meet criteria for inpatient  admission.  Hyponatremia. Patient serum sodium was 125 on admission. She does not recall having diarrhea or vomiting. She was started on normal saline infusion. Her serum sodium improved to 139. Etiology was likely volume depletion given the correction with IV fluids. -We'll decrease the rate and follow-up on a serum sodium tomorrow.  Acute kidney injury superimposed on stage III to stage IV chronic kidney disease. Patient's baseline creatinine appears to be 2.47, from one year ago. It was 3.02 on admission. She was started on gentle IV fluids. Her creatinine improved to 2.74. -We'll continue to monitor and check her renal function again tomorrow.  Normocytic anemia. Patient's baseline hemoglobin is 10.0-10.5. It was 10.0 on admission, but fell to 9.2 with hydration. We'll order a few anemia studies.   DVT prophylaxis: subcutaneous heparin Code Status: full code Family Communication: family not available Disposition Plan: discharge when clinically appropriate, likely in the next 24 hours.   Consultants:   Psychiatry  Procedures:   None  Antimicrobials:      Subjective: Patient denies headache, visual hallucinations, or operatory hallucinations. No complaints of chest pain or shortness of breath.  Objective: Vitals:   04/15/16 2209 04/15/16 2230 04/15/16 2329 04/16/16 0458  BP: 133/84 135/86 (!) 163/86 122/70  Pulse: 69 64 68 73  Resp: 20  20 20   Temp: 98.1 F (36.7 C)  98 F (36.7 C) 98 F (36.7 C)  TempSrc: Oral  Oral Oral  SpO2: 99% 99% 100% 100%  Weight:   56.2 kg (123 lb 14.4 oz)   Height:   5\' 4"  (1.626 m)     Intake/Output Summary (Last 24 hours) at 04/16/16 1310 Last data filed at 04/16/16 1027  Gross  per 24 hour  Intake           908.33 ml  Output             1000 ml  Net           -91.67 ml   Filed Weights   04/15/16 1638 04/15/16 2329  Weight: 59 kg (130 lb) 56.2 kg (123 lb 14.4 oz)    Examination:  General exam: Appears calm and  comfortable; 65 year old African-American woman in no acute distress.   Respiratory system: Clear to auscultation. Respiratory effort normal. Cardiovascular system: S1 & S2 heard, RRR. No JVD, murmurs, rubs, gallops or clicks. No pedal edema. Gastrointestinal system: Abdomen is nondistended, soft and nontender. No organomegaly or masses felt. Normal bowel sounds heard. Central nervous system: Alert and oriented. No focal neurological deficits except mild tremor of her lips and fingers. Extremities: Symmetric 5 x 5 power. Skin: No rashes, lesions or ulcers Psychiatry: she has a flat affect. She denies auditory or visual hallucinations. She does have some statements that did not seem to make sense, but mostly her speech was clear and coherent.     Data Reviewed: I have personally reviewed following labs and imaging studies  CBC:  Recent Labs Lab 04/15/16 1913 04/16/16 0515  WBC 4.7 3.2*  NEUTROABS 3.0  --   HGB 10.0* 9.2*  HCT 29.0* 27.0*  MCV 87.1 88.5  PLT 194 [id number]   Basic Metabolic Panel:  Recent Labs Lab 04/15/16 1913 04/16/16 0515  NA 125* 139  K 3.6 3.9  CL 97* 108  CO2 21* 23  GLUCOSE 104* 106*  BUN 58* 53*  CREATININE 3.02* 2.74*  CALCIUM 8.5* 8.8*   GFR: Estimated Creatinine Clearance: 17.4 mL/min (by C-G formula based on SCr of 2.74 mg/dL). Liver Function Tests:  Recent Labs Lab 04/15/16 1913 04/16/16 0515  AST 27 21  ALT 13* 11*  ALKPHOS 60 50  BILITOT 0.6 0.2*  PROT 6.4* 5.8*  ALBUMIN 3.4* 2.9*   No results for input(s): LIPASE, AMYLASE in the last 168 hours. No results for input(s): AMMONIA in the last 168 hours. Coagulation Profile: No results for input(s): INR, PROTIME in the last 168 hours. Cardiac Enzymes: No results for input(s): CKTOTAL, CKMB, CKMBINDEX, TROPONINI in the last 168 hours. BNP (last 3 results) No results for input(s): PROBNP in the last 8760 hours. HbA1C: No results for input(s): HGBA1C in the last 72 hours. CBG: No  results for input(s): GLUCAP in the last 168 hours. Lipid Profile: No results for input(s): CHOL, HDL, LDLCALC, TRIG, CHOLHDL, LDLDIRECT in the last 72 hours. Thyroid Function Tests:  Recent Labs  04/16/16 0515  TSH 1.937   Anemia Panel: No results for input(s): VITAMINB12, FOLATE, FERRITIN, TIBC, IRON, RETICCTPCT in the last 72 hours. Sepsis Labs: No results for input(s): PROCALCITON, LATICACIDVEN in the last 168 hours.  Recent Results (from the past 240 hour(s))  MRSA PCR Screening     Status: Abnormal   Collection Time: 04/15/16 11:34 PM  Result Value Ref Range Status   MRSA by PCR POSITIVE (A) NEGATIVE Final    Comment:        The GeneXpert MRSA Assay (FDA approved for NASAL specimens only), is one component of a comprehensive MRSA colonization surveillance program. It is not intended to diagnose MRSA infection nor to guide or monitor treatment for MRSA infections. RESULT CALLED TO, READ BACK BY AND VERIFIED WITH:  HAMILTON,S @ 0410 ON 04/16/16 BY [place] Planas  Radiology Studies: Dg Chest 2 View  Result Date: 04/15/2016 CLINICAL DATA:  Cough and altered mental status. EXAM: CHEST  2 VIEW COMPARISON:  04/12/2016 and prior radiographs FINDINGS: Upper limits normal heart size noted. There is no evidence of focal airspace disease, pulmonary edema, suspicious pulmonary nodule/mass, pleural effusion, or pneumothorax. No acute bony abnormalities are identified. IMPRESSION: Upper limits of normal heart size without evidence of active cardiopulmonary disease. Electronically Signed   By: Margarette Canada M.D.   On: 04/15/2016 18:20  Ct Head Wo Contrast  Result Date: 04/15/2016 CLINICAL DATA:  Increased confusion.  History of schizophrenia. EXAM: CT HEAD WITHOUT CONTRAST TECHNIQUE: Contiguous axial images were obtained from the base of the skull through the vertex without intravenous contrast. COMPARISON:  04/18/2009 FINDINGS: Brain: Similar findings of atrophy with centralized  volume loss with commensurate ex vacuo dilatation of the ventricular system. Gray-white differentiation is maintained without CT evidence of acute large territory infarct. No intraparenchymal or extra-axial hemorrhage. No midline shift. Re- demonstrated exuberant calcifications about the midline of the falx with a suspected approximately 1.8 x 1.6 x 1.5 cm partially calcified extra-axial lesion about the right-side of the vertex of the falx (axial image 29, series 2, coronal image 29, series 4, similar to the 03/2015 examination and again favored to represent a meningioma. No associated mass effect. No intraparenchymal mass. Vascular: No hyperdense vessel or unexpected calcification. Skull: No displaced calvarial fracture. Sinuses/Orbits: Underpneumatization the bilateral frontal sinuses. The remaining paranasal sinuses and mastoid air cells are normally aerated. No air-fluid levels. Other: Regional soft tissues appear normal. IMPRESSION: 1. Similar findings of centralized atrophy without acute intracranial process. 2. Similar findings of exuberant calcifications about the midline of the falx with a suspected approximately 1.8 cm meningioma arising from the right side of the vertex of the falx, similar to the 03/2015 examination and without associated mass effect. Electronically Signed   By: Sandi Mariscal M.D.   On: 04/15/2016 19:16       Scheduled Meds: . Chlorhexidine Gluconate Cloth  6 each Topical Q0600  . heparin  5,000 Units Subcutaneous Q8H  . multivitamin with minerals  1 tablet Oral Daily  . mupirocin ointment  1 application Nasal BID  . OLANZapine  20 mg Oral QHS  . OLANZapine  5 mg Oral QHS  . thiamine  50 mg Oral Daily   Continuous Infusions: . dextrose 5 % and 0.9% NaCl 10 mL/hr at 04/16/16 0721     LOS: 0 days    Time spent: 71 minutes    Rexene Alberts, MD [facility] Pager [phone]   If 7PM-7AM, please contact night-coverage [email or web address] Password  [email or web address] 04/16/2016, 1:10 PM

## 2016-04-16 NOTE — Consult Note (Signed)
Telepsych Consultation   Reason for Consult:  Psychosis, Confusion Referring Physician: EDP Dr. Caryn Section  Patient Identification: Madison [name] MRN:  [id number] Principal Diagnosis: Acute psychosis Diagnosis:   Patient Active Problem List   Diagnosis Date Noted  . CKD (chronic kidney disease) stage 3, GFR 30-59 ml/min [N18.3] 04/16/2016  . Anemia due to other cause [D64.89] 04/16/2016  . Meningioma ([place]) [D32.9] 04/16/2016  . Acute psychosis [F29] 04/15/2016  . Hyponatremia [E87.1] 04/15/2016  . AKI (acute kidney injury) ([place]) [N17.9] 04/15/2016  . Special screening for malignant neoplasms, colon [Z12.11]   . Thrombocytopenia ([place]) [D69.6] 01/05/2016  . Altered mental status [R41.82] 04/19/2015    Total Time spent with patient: 30 minutes  Subjective:   Madison [name] is a 65 y.o. female patient admitted with  Altered mental status.   HPI:    Madison [name] is a 65 y.o. female with medical history significant of schizophrenia, renal insuffiency, anxiety disorder, presented with complaints of increasing confusion over the past week. She denies any pain or physical issues. She reports living alone but is walking distance from family and stays with them at night. She reports having completed a course of abx recently for pneumonia. She was brought into the hospital for increasing confusion.  Her meds were given by her sister, and she admitted to having been inpatient psychiatric hospital for a month previously.  She admitted to having non command auditory hallucination on admission. Today the patient denies this symptom to Probation officer.   No homicidal or suicidal ideation reported.  She denied any substance abuse. Patient is alert and oriented during the assessment. She reports seeing a therapist and outpatient Psychiatrist on a regular basis. There is no evidence of acute psychotic process during the assessment. She speaks about feeling frustrated with her sister but denies any acute psychiatric  symptoms. Writer spoke with Dr. Caryn Section via telephone. It is possible that patient's confusion was related to hyponatremia as her sodium was 125 on 04/15/2016 at 19:13. It has since been corrected with current chemistry profile sodium 139. Based on this assessment there is no criteria to change patient's current psychiatric medication regimen. She does not meet any criteria for an inpatient admission.   Past Psychiatric History: Schizophrenia  Risk to Self: Is patient at risk for suicide?: No Risk to Others:   Prior Inpatient Therapy:   yes reports Mollie Germany years ago  Prior Outpatient Therapy:  Yes   Past Medical History:  Past Medical History:  Diagnosis Date  . Anxiety   . CKD (chronic kidney disease) stage 3, GFR 30-59 ml/min    baseline cr 2.47  . Neuropathy ([place])   . Renal disorder   . Schizophrenia [facility])     Past Surgical History:  Procedure Laterality Date  . CHOLECYSTECTOMY    . COLONOSCOPY N/A 01/20/2016   Procedure: COLONOSCOPY;  Surgeon: [name], MD;  Location: [facility] ENDO SUITE;  Service: Endoscopy;  Laterality: N/A;  1:15 PM-moved to 130 Office to notify  . HEMICOLECTOMY Right   . TUBAL LIGATION     Family History:  Family History  Problem Relation Age of Onset  . Alzheimer's disease Mother   . Cancer Father   . Kidney failure Sister    Social History:  History  Alcohol Use No     History  Drug Use No    Social History   Social History  . Marital status: Single    Spouse name: N/A  . Number of children: N/A  .  Years of education: N/A   Social History Main Topics  . Smoking status: Never Smoker  . Smokeless tobacco: Never Used  . Alcohol use No  . Drug use: No  . Sexual activity: Not Asked   Other Topics Concern  . None   Social History Narrative  . None   Additional Social History:    Allergies:  No Known Allergies  Labs:  Results for orders placed or performed during the hospital encounter of 04/15/16 (from the past 48 hour(s))   Urinalysis, Routine w reflex microscopic (not at [facility])     Status: Abnormal   Collection Time: 04/15/16  5:52 PM  Result Value Ref Range   Color, Urine YELLOW YELLOW   APPearance CLEAR CLEAR   Specific Gravity, Urine <1.005 (L) 1.005 - 1.030   pH 6.0 5.0 - 8.0   Glucose, UA NEGATIVE NEGATIVE mg/dL   Hgb urine dipstick SMALL (A) NEGATIVE   Bilirubin Urine NEGATIVE NEGATIVE   Ketones, ur NEGATIVE NEGATIVE mg/dL   Protein, ur 100 (A) NEGATIVE mg/dL   Nitrite NEGATIVE NEGATIVE   Leukocytes, UA LARGE (A) NEGATIVE  Urine rapid drug screen (hosp performed)     Status: Abnormal   Collection Time: 04/15/16  5:52 PM  Result Value Ref Range   Opiates POSITIVE (A) NONE DETECTED   Cocaine NONE DETECTED NONE DETECTED   Benzodiazepines NONE DETECTED NONE DETECTED   Amphetamines NONE DETECTED NONE DETECTED   Tetrahydrocannabinol NONE DETECTED NONE DETECTED   Barbiturates NONE DETECTED NONE DETECTED    Comment:        DRUG SCREEN FOR MEDICAL PURPOSES ONLY.  IF CONFIRMATION IS NEEDED FOR ANY PURPOSE, NOTIFY LAB WITHIN 5 DAYS.        LOWEST DETECTABLE LIMITS FOR URINE DRUG SCREEN Drug Class       Cutoff (ng/mL) Amphetamine      1000 Barbiturate      200 Benzodiazepine   300 Tricyclics       923 Opiates          300 Cocaine          300 THC              50   Urine microscopic-add on     Status: Abnormal   Collection Time: 04/15/16  5:52 PM  Result Value Ref Range   Squamous Epithelial / LPF 6-30 (A) NONE SEEN   WBC, UA 6-30 0 - 5 WBC/hpf   RBC / HPF 6-30 0 - 5 RBC/hpf   Bacteria, UA FEW (A) NONE SEEN  CBC with Differential/Platelet     Status: Abnormal   Collection Time: 04/15/16  7:13 PM  Result Value Ref Range   WBC 4.7 4.0 - 10.5 K/uL   RBC 3.33 (L) 3.87 - 5.11 MIL/uL   Hemoglobin 10.0 (L) 12.0 - 15.0 g/dL   HCT 29.0 (L) 36.0 - 46.0 %   MCV 87.1 78.0 - 100.0 fL   MCH 30.0 26.0 - 34.0 pg   MCHC 34.5 30.0 - 36.0 g/dL   RDW 13.4 11.5 - 15.5 %   Platelets 194 150 - 400 K/uL    Neutrophils Relative % 64 %   Neutro Abs 3.0 1.7 - 7.7 K/uL   Lymphocytes Relative 22 %   Lymphs Abs 1.1 0.7 - 4.0 K/uL   Monocytes Relative 12 %   Monocytes Absolute 0.6 0.1 - 1.0 K/uL   Eosinophils Relative 2 %   Eosinophils Absolute 0.1 0.0 - 0.7 K/uL  Basophils Relative 0 %   Basophils Absolute 0.0 0.0 - 0.1 K/uL  Comprehensive metabolic panel     Status: Abnormal   Collection Time: 04/15/16  7:13 PM  Result Value Ref Range   Sodium 125 (L) 135 - 145 mmol/L   Potassium 3.6 3.5 - 5.1 mmol/L   Chloride 97 (L) 101 - 111 mmol/L   CO2 21 (L) 22 - 32 mmol/L   Glucose, Bld 104 (H) 65 - 99 mg/dL   BUN 58 (H) 6 - 20 mg/dL   Creatinine, Ser 3.02 (H) 0.44 - 1.00 mg/dL   Calcium 8.5 (L) 8.9 - 10.3 mg/dL   Total Protein 6.4 (L) 6.5 - 8.1 g/dL   Albumin 3.4 (L) 3.5 - 5.0 g/dL   AST 27 15 - 41 U/L   ALT 13 (L) 14 - 54 U/L   Alkaline Phosphatase 60 38 - 126 U/L   Total Bilirubin 0.6 0.3 - 1.2 mg/dL   GFR calc non Af Amer 15 (L) >60 mL/min   GFR calc Af Amer 17 (L) >60 mL/min    Comment: (NOTE) The eGFR has been calculated using the CKD EPI equation. This calculation has not been validated in all clinical situations. eGFR's persistently <60 mL/min signify possible Chronic Kidney Disease.    Anion gap 7 5 - 15  Ethanol     Status: None   Collection Time: 04/15/16  7:13 PM  Result Value Ref Range   Alcohol, Ethyl (B) <5 <5 mg/dL    Comment:        LOWEST DETECTABLE LIMIT FOR SERUM ALCOHOL IS 5 mg/dL FOR MEDICAL PURPOSES ONLY   MRSA PCR Screening     Status: Abnormal   Collection Time: 04/15/16 11:34 PM  Result Value Ref Range   MRSA by PCR POSITIVE (A) NEGATIVE    Comment:        The GeneXpert MRSA Assay (FDA approved for NASAL specimens only), is one component of a comprehensive MRSA colonization surveillance program. It is not intended to diagnose MRSA infection nor to guide or monitor treatment for MRSA infections. RESULT CALLED TO, READ BACK BY AND VERIFIED WITH:   HAMILTON,S @ 0410 ON 04/16/16 BY WOODIE,J   Comprehensive metabolic panel     Status: Abnormal   Collection Time: 04/16/16  5:15 AM  Result Value Ref Range   Sodium 139 135 - 145 mmol/L    Comment: DELTA CHECK NOTED   Potassium 3.9 3.5 - 5.1 mmol/L   Chloride 108 101 - 111 mmol/L   CO2 23 22 - 32 mmol/L   Glucose, Bld 106 (H) 65 - 99 mg/dL   BUN 53 (H) 6 - 20 mg/dL   Creatinine, Ser 2.74 (H) 0.44 - 1.00 mg/dL   Calcium 8.8 (L) 8.9 - 10.3 mg/dL   Total Protein 5.8 (L) 6.5 - 8.1 g/dL   Albumin 2.9 (L) 3.5 - 5.0 g/dL   AST 21 15 - 41 U/L   ALT 11 (L) 14 - 54 U/L   Alkaline Phosphatase 50 38 - 126 U/L   Total Bilirubin 0.2 (L) 0.3 - 1.2 mg/dL   GFR calc non Af Amer 17 (L) >60 mL/min   GFR calc Af Amer 20 (L) >60 mL/min    Comment: (NOTE) The eGFR has been calculated using the CKD EPI equation. This calculation has not been validated in all clinical situations. eGFR's persistently <60 mL/min signify possible Chronic Kidney Disease.    Anion gap 8 5 - 15  CBC  Status: Abnormal   Collection Time: 04/16/16  5:15 AM  Result Value Ref Range   WBC 3.2 (L) 4.0 - 10.5 K/uL   RBC 3.05 (L) 3.87 - 5.11 MIL/uL   Hemoglobin 9.2 (L) 12.0 - 15.0 g/dL   HCT 27.0 (L) 36.0 - 46.0 %   MCV 88.5 78.0 - 100.0 fL   MCH 30.2 26.0 - 34.0 pg   MCHC 34.1 30.0 - 36.0 g/dL   RDW 13.7 11.5 - 15.5 %   Platelets 207 150 - 400 K/uL    Comment: SPECIMEN CHECKED FOR CLOTS  TSH     Status: None   Collection Time: 04/16/16  5:15 AM  Result Value Ref Range   TSH 1.937 0.350 - 4.500 uIU/mL    Current Facility-Administered Medications  Medication Dose Route Frequency Provider Last Rate Last Dose  . Chlorhexidine Gluconate Cloth 2 % PADS 6 each  6 each Topical Q0600 Rexene Alberts, MD   6 each at 04/16/16 0600  . dextrose 5 %-0.9 % sodium chloride infusion   Intravenous Continuous Rexene Alberts, MD 10 mL/hr at 04/16/16 1351    . heparin injection 5,000 Units  5,000 Units Subcutaneous Q8H Orvan Falconer, MD   5,000  Units at 04/16/16 1350  . multivitamin with minerals tablet 1 tablet  1 tablet Oral Daily Orvan Falconer, MD   1 tablet at 04/16/16 1105  . mupirocin ointment (BACTROBAN) 2 % 1 application  1 application Nasal BID Rexene Alberts, MD   1 application at 37/10/62 1106  . OLANZapine (ZYPREXA) tablet 20 mg  20 mg Oral QHS Orvan Falconer, MD   20 mg at 04/15/16 2344  . OLANZapine (ZYPREXA) tablet 5 mg  5 mg Oral QHS Orvan Falconer, MD   5 mg at 04/15/16 2345  . polyvinyl alcohol (LIQUIFILM TEARS) 1.4 % ophthalmic solution 1 drop  1 drop Both Eyes PRN Orvan Falconer, MD      . thiamine (VITAMIN B-1) tablet 50 mg  50 mg Oral Daily Orvan Falconer, MD   50 mg at 04/16/16 1105    Musculoskeletal:  Unable to assess via camera   Psychiatric Specialty Exam: Physical Exam  Review of Systems  Psychiatric/Behavioral: Negative for depression, hallucinations, memory loss, substance abuse and suicidal ideas. The patient is not nervous/anxious and does not have insomnia.     Blood pressure 122/70, pulse 73, temperature 98 F (36.7 C), temperature source Oral, resp. rate 20, height '5\' 4"'$  (1.626 m), weight 56.2 kg (123 lb 14.4 oz), SpO2 100 %.Body mass index is 21.27 kg/m.  General Appearance: Casual  Eye Contact:  Good  Speech:  Clear and Coherent and Slow  Volume:  Decreased  Mood:  Euthymic  Affect:  Appropriate  Thought Process:  Coherent  Orientation:  Full (Time, Place, and Person)  Thought Content:  WDL  Suicidal Thoughts:  No  Homicidal Thoughts:  No  Memory:  Immediate;   Fair Recent;   Fair Remote;   Fair  Judgement:  Fair  Insight:  Present  Psychomotor Activity:  Normal  Concentration:  Concentration: Fair and Attention Span: Fair  Recall:  [place] of Knowledge:  Good  Language:  Good  Akathisia:  No  Handed:  Right  AIMS (if indicated):     Assets:  Communication Skills Desire for Improvement Financial Resources/Insurance Housing Intimacy Leisure Time Resilience Social Support  ADL's:  Intact   Cognition:  WNL  Sleep:        Treatment Plan Summary: Plan Follow  up with outpatient provider after leaving the hospital.  She reports being seen on a regular basis by her Psychiatrist. Continue Zyprexa 25 mg at bedtime for schizophrenia as ordered.   Disposition: No evidence of imminent risk to self or others at present.   Patient does not meet criteria for psychiatric inpatient admission. Supportive therapy provided about ongoing stressors. Discussed crisis plan, support from social network, calling 911, coming to the Emergency Department, and calling Suicide Hotline.  Elmarie Shiley, NP 04/16/2016 2:00 PM

## 2016-04-17 DIAGNOSIS — D6489 Other specified anemias: Secondary | ICD-10-CM | POA: Diagnosis not present

## 2016-04-17 DIAGNOSIS — F419 Anxiety disorder, unspecified: Secondary | ICD-10-CM | POA: Diagnosis not present

## 2016-04-17 DIAGNOSIS — Z809 Family history of malignant neoplasm, unspecified: Secondary | ICD-10-CM | POA: Diagnosis not present

## 2016-04-17 DIAGNOSIS — D329 Benign neoplasm of meninges, unspecified: Secondary | ICD-10-CM | POA: Diagnosis not present

## 2016-04-17 DIAGNOSIS — E871 Hypo-osmolality and hyponatremia: Secondary | ICD-10-CM | POA: Diagnosis not present

## 2016-04-17 DIAGNOSIS — N183 Chronic kidney disease, stage 3 (moderate): Secondary | ICD-10-CM | POA: Diagnosis not present

## 2016-04-17 DIAGNOSIS — N184 Chronic kidney disease, stage 4 (severe): Secondary | ICD-10-CM | POA: Diagnosis not present

## 2016-04-17 DIAGNOSIS — G9340 Encephalopathy, unspecified: Secondary | ICD-10-CM | POA: Diagnosis not present

## 2016-04-17 DIAGNOSIS — R410 Disorientation, unspecified: Secondary | ICD-10-CM | POA: Diagnosis present

## 2016-04-17 DIAGNOSIS — E870 Hyperosmolality and hypernatremia: Secondary | ICD-10-CM | POA: Diagnosis not present

## 2016-04-17 DIAGNOSIS — Z841 Family history of disorders of kidney and ureter: Secondary | ICD-10-CM | POA: Diagnosis not present

## 2016-04-17 DIAGNOSIS — Z820 Family history of epilepsy and other diseases of the nervous system: Secondary | ICD-10-CM | POA: Diagnosis not present

## 2016-04-17 DIAGNOSIS — N179 Acute kidney failure, unspecified: Secondary | ICD-10-CM | POA: Diagnosis present

## 2016-04-17 DIAGNOSIS — D649 Anemia, unspecified: Secondary | ICD-10-CM | POA: Diagnosis not present

## 2016-04-17 DIAGNOSIS — Z8701 Personal history of pneumonia (recurrent): Secondary | ICD-10-CM | POA: Diagnosis not present

## 2016-04-17 DIAGNOSIS — F29 Unspecified psychosis not due to a substance or known physiological condition: Secondary | ICD-10-CM | POA: Diagnosis not present

## 2016-04-17 DIAGNOSIS — I129 Hypertensive chronic kidney disease with stage 1 through stage 4 chronic kidney disease, or unspecified chronic kidney disease: Secondary | ICD-10-CM | POA: Diagnosis not present

## 2016-04-17 DIAGNOSIS — F209 Schizophrenia, unspecified: Secondary | ICD-10-CM | POA: Diagnosis not present

## 2016-04-17 LAB — CBC
HCT: 28.8 % — ABNORMAL LOW (ref 36.0–46.0)
HEMOGLOBIN: 9.7 g/dL — AB (ref 12.0–15.0)
MCH: 29.9 pg (ref 26.0–34.0)
MCHC: 33.7 g/dL (ref 30.0–36.0)
MCV: 88.9 fL (ref 78.0–100.0)
PLATELETS: 223 10*3/uL (ref 150–400)
RBC: 3.24 MIL/uL — ABNORMAL LOW (ref 3.87–5.11)
RDW: 14.5 % (ref 11.5–15.5)
WBC: 3.8 10*3/uL — ABNORMAL LOW (ref 4.0–10.5)

## 2016-04-17 LAB — BASIC METABOLIC PANEL
ANION GAP: 6 (ref 5–15)
Anion gap: 3 — ABNORMAL LOW (ref 5–15)
BUN: 60 mg/dL — ABNORMAL HIGH (ref 6–20)
BUN: 60 mg/dL — AB (ref 6–20)
CALCIUM: 9.2 mg/dL (ref 8.9–10.3)
CALCIUM: 9.3 mg/dL (ref 8.9–10.3)
CO2: 23 mmol/L (ref 22–32)
CO2: 23 mmol/L (ref 22–32)
CREATININE: 3.09 mg/dL — AB (ref 0.44–1.00)
CREATININE: 3.18 mg/dL — AB (ref 0.44–1.00)
Chloride: 120 mmol/L — ABNORMAL HIGH (ref 101–111)
Chloride: 123 mmol/L — ABNORMAL HIGH (ref 101–111)
GFR, EST AFRICAN AMERICAN: 17 mL/min — AB (ref >60)
GFR, EST AFRICAN AMERICAN: 16 mL/min — AB (ref >60)
GFR, EST NON AFRICAN AMERICAN: 15 mL/min — AB (ref >60)
GFR, EST NON AFRICAN AMERICAN: 14 mL/min — AB (ref >60)
GLUCOSE: 98 mg/dL (ref 65–99)
Glucose, Bld: 99 mg/dL (ref 65–99)
Potassium: 4.7 mmol/L (ref 3.5–5.1)
Potassium: 4.5 mmol/L (ref 3.5–5.1)
SODIUM: 149 mmol/L — AB (ref 135–145)
Sodium: 149 mmol/L — ABNORMAL HIGH (ref 135–145)

## 2016-04-17 LAB — IRON AND TIBC
Iron: 49 ug/dL (ref 28–170)
SATURATION RATIOS: 22 % (ref 10.4–31.8)
TIBC: 220 ug/dL — AB (ref 250–450)
UIBC: 171 ug/dL

## 2016-04-17 LAB — HIV ANTIBODY (ROUTINE TESTING W REFLEX): HIV SCREEN 4TH GENERATION: NONREACTIVE

## 2016-04-17 LAB — FERRITIN: Ferritin: 58 ng/mL (ref 11–307)

## 2016-04-17 MED ORDER — SODIUM CHLORIDE 0.45 % IV SOLN
INTRAVENOUS | Status: DC
  Administered 2016-04-17: 18:00:00 via INTRAVENOUS

## 2016-04-17 MED ORDER — HYDRALAZINE HCL 10 MG PO TABS
10.0000 mg | ORAL_TABLET | Freq: Three times a day (TID) | ORAL | Status: DC
  Administered 2016-04-17 (×2): 10 mg via ORAL
  Filled 2016-04-17 (×3): qty 1

## 2016-04-17 NOTE — Progress Notes (Signed)
Late entry:  Patient's IV infiltrated.  Unable to restart IV after multiple attempts (several nurses).  Dr. Caryn Section notified and stated to continue to try for access d/t IV fluids.  ICU RN notified and will continue to try for access tonight.

## 2016-04-17 NOTE — Care Management Note (Signed)
Case Management Note  Patient Details  Name: Nealy Holgerson MRN: [id number] Date of Birth: Apr 01, 1951   Additional Comments: Spoke with patient and sister, [name] who both are agreeable to having home health at discharge. Offered choice. [facility] Cal[facility] of [facility] notified and will obtain information from chart. Patient made aware that [facility] has 48 hours to make first visit.   Khalie Wince, Chauncey Reading, RN 04/17/2016, 1:27 PM

## 2016-04-17 NOTE — Progress Notes (Addendum)
PROGRESS NOTE    Madison [name]  [id number] DOB: 10-01-1950 DOA: 04/15/2016 PCP: Elyn Peers, MD    Brief Narrative:  Patient is a 65 year old woman with a history of schizophrenia, chronic kidney disease, and chronic anxiety, who presented to the ED on 04/15/2016 with report of confusion and hallucinations. She recently completed a course of antibiotics for pneumonia. Apparently, her medications are given by her sister. In the ED, she was afebrile and hemodynamically stable. CT of her head revealed no acute findings, but with a suspicion of a 1.8 cm meningioma arising from the right side of the vertex of the falx, similar to exam 03/2015. Her chest x-ray revealed no active disease. Her lab data were significant for serum sodium of 125, creatinine of 3.02, and hemoglobin of 10.0. Urine drug screen was positive for opiates. Alcohol level was negative. Urinalysis was unremarkable. She was admitted for further evaluation and management.  Assessment & Plan:   Principal Problem:   Acute psychosis Active Problems:   Hyponatremia   AKI (acute kidney injury) ([place])   CKD (chronic kidney disease) stage 3, GFR 30-59 ml/min   Anemia due to other cause   Meningioma ([place])   1. Acute encephalopathy/query acute psychosis. The patient has underlying schizophrenia and is treated chronically with Zyprexa. She was restarted on Zyprexa. On follow-up exam this morning, she denies auditory or visual hallucinations. She has no complaints of headaches. -For further evaluation, number studies were ordered. A TSH was 1.9. HIV was nonreactive. Vitamin B12 level was actually elevated. -The etiology of her acute confusion was likely hyponatremia in the setting of schizophrenia.  -Psychiatry consulted. Psychiatry nurse practitioner recommended continuation of her Zyprexa and follow-up with her psychiatrist; she did not need inpatient psychiatric treatment.  Hyponatremia. Patient serum sodium was 125 on admission.  She does not recall having diarrhea or vomiting. She was started on normal saline infusion. Her serum sodium improved to 139. Etiology was likely volume depletion given the correction with IV fluids. IV fluids were tapered off.  Hypernatremia. Patient sodium was 125>>139>> 149.Marland Kitchen Serum sodium repeated at 149. It is unclear the change in her sodium levels. -We will nursing staff to closely monitor her intake. -We will restart IV fluids at half-normal saline. Will check urine sodium and potassium.  Acute kidney injury superimposed on stage III to stage IV chronic kidney disease. Patient's baseline creatinine appears to be 2.47, from one year ago. It was 3.02 on admission. She was started on gentle IV fluids. Her creatinine improved to 2.74, but when the IV fluids were tapered off, her creatinine increased to 3.18. -We'll restart IV fluids at half-normal saline.  Normocytic anemia. Patient's baseline hemoglobin is 10.0-10.5. It was 10.0 on admission, but fell to 9.2 with hydration. Her total iron was normal at 49, ferritin within normal limits at 58, and elevated vitamin B12. We'll continue to monitor.  Apparent meningioma CT of the head on admission revealed a 1.8 cm suspected meningioma arising from the right side of the vertex of the faux; no mass effect.   DVT prophylaxis: subcutaneous heparin Code Status: full code Family Communication: family not available Disposition Plan: discharge when clinically appropriate, likely in the next 24 hours.   Consultants:   Psychiatry  Procedures:   None  Antimicrobials:   None   Subjective: Patient denies headache, visual hallucinations, or operatory hallucinations. No acute changes overnight per nursing. Patient denies diarrhea or vomiting.  Objective: Vitals:   04/16/16 1422 04/16/16 2200 04/17/16 0510 04/17/16 1356  BP: 137/70 134/68 132/64 (!) 173/74  Pulse: 79 80 85 81  Resp: 20 20 20 20   Temp: 98.7 F (37.1 C) 98.6 F (37  C) 98.5 F (36.9 C) 98.1 F (36.7 C)  TempSrc: Oral Oral Oral   SpO2: 98% 100% 97% 100%  Weight:      Height:        Intake/Output Summary (Last 24 hours) at 04/17/16 1514 Last data filed at 04/17/16 1300  Gross per 24 hour  Intake          1109.75 ml  Output             1700 ml  Net          -590.25 ml   Filed Weights   04/15/16 1638 04/15/16 2329  Weight: 59 kg (130 lb) 56.2 kg (123 lb 14.4 oz)    Examination:  General exam: Appears calm and comfortable; 65 year old African-American woman in no acute distress.   Respiratory system: Clear to auscultation. Respiratory effort normal. Cardiovascular system: S1 & S2 heard, RRR. No JVD, murmurs, rubs, gallops or clicks. No pedal edema. Gastrointestinal system: Abdomen is nondistended, soft and nontender. No organomegaly or masses felt. Normal bowel sounds heard. Central nervous system: Alert and oriented. No focal neurological deficits except mild tremor of her lips and fingers. Extremities: Symmetric 5 x 5 power. Skin: No rashes, lesions or ulcers Psychiatry: she has a flat affect. She denies auditory or visual hallucinations. She does have some statements that did not seem to make sense, but mostly her speech was clear and coherent.     Data Reviewed: I have personally reviewed following labs and imaging studies  CBC:  Recent Labs Lab 04/15/16 1913 04/16/16 0515 04/17/16 0440  WBC 4.7 3.2* 3.8*  NEUTROABS 3.0  --   --   HGB 10.0* 9.2* 9.7*  HCT 29.0* 27.0* 28.8*  MCV 87.1 88.5 88.9  PLT 194 207 [id number]   Basic Metabolic Panel:  Recent Labs Lab 04/15/16 1913 04/16/16 0515 04/17/16 0440 04/17/16 0802  NA 125* 139 149* 149*  K 3.6 3.9 4.7 4.5  CL 97* 108 120* 123*  CO2 21* 23 23 23   GLUCOSE 104* 106* 98 99  BUN 58* 53* 60* 60*  CREATININE 3.02* 2.74* 3.09* 3.18*  CALCIUM 8.5* 8.8* 9.2 9.3   GFR: Estimated Creatinine Clearance: 15 mL/min (by C-G formula based on SCr of 3.18 mg/dL). Liver Function  Tests:  Recent Labs Lab 04/15/16 1913 04/16/16 0515  AST 27 21  ALT 13* 11*  ALKPHOS 60 50  BILITOT 0.6 0.2*  PROT 6.4* 5.8*  ALBUMIN 3.4* 2.9*   No results for input(s): LIPASE, AMYLASE in the last 168 hours. No results for input(s): AMMONIA in the last 168 hours. Coagulation Profile: No results for input(s): INR, PROTIME in the last 168 hours. Cardiac Enzymes: No results for input(s): CKTOTAL, CKMB, CKMBINDEX, TROPONINI in the last 168 hours. BNP (last 3 results) No results for input(s): PROBNP in the last 8760 hours. HbA1C: No results for input(s): HGBA1C in the last 72 hours. CBG: No results for input(s): GLUCAP in the last 168 hours. Lipid Profile: No results for input(s): CHOL, HDL, LDLCALC, TRIG, CHOLHDL, LDLDIRECT in the last 72 hours. Thyroid Function Tests:  Recent Labs  04/16/16 0515  TSH 1.937   Anemia Panel:  Recent Labs  04/16/16 0515 04/16/16 1505  VITAMINB12 1,021*  --   FERRITIN  --  58  TIBC  --  220*  IRON  --  49   Sepsis Labs: No results for input(s): PROCALCITON, LATICACIDVEN in the last 168 hours.  Recent Results (from the past 240 hour(s))  MRSA PCR Screening     Status: Abnormal   Collection Time: 04/15/16 11:34 PM  Result Value Ref Range Status   MRSA by PCR POSITIVE (A) NEGATIVE Final    Comment:        The GeneXpert MRSA Assay (FDA approved for NASAL specimens only), is one component of a comprehensive MRSA colonization surveillance program. It is not intended to diagnose MRSA infection nor to guide or monitor treatment for MRSA infections. RESULT CALLED TO, READ BACK BY AND VERIFIED WITH:  HAMILTON,S @ 0410 ON 04/16/16 BY [facility]          Radiology Studies: Dg Chest 2 View  Result Date: 04/15/2016 CLINICAL DATA:  Cough and altered mental status. EXAM: CHEST  2 VIEW COMPARISON:  04/12/2016 and prior radiographs FINDINGS: Upper limits normal heart size noted. There is no evidence of focal airspace disease, pulmonary  edema, suspicious pulmonary nodule/mass, pleural effusion, or pneumothorax. No acute bony abnormalities are identified. IMPRESSION: Upper limits of normal heart size without evidence of active cardiopulmonary disease. Electronically Signed   By: Margarette Canada M.D.   On: 04/15/2016 18:20  Ct Head Wo Contrast  Result Date: 04/15/2016 CLINICAL DATA:  Increased confusion.  History of schizophrenia. EXAM: CT HEAD WITHOUT CONTRAST TECHNIQUE: Contiguous axial images were obtained from the base of the skull through the vertex without intravenous contrast. COMPARISON:  04/18/2009 FINDINGS: Brain: Similar findings of atrophy with centralized volume loss with commensurate ex vacuo dilatation of the ventricular system. Gray-white differentiation is maintained without CT evidence of acute large territory infarct. No intraparenchymal or extra-axial hemorrhage. No midline shift. Re- demonstrated exuberant calcifications about the midline of the falx with a suspected approximately 1.8 x 1.6 x 1.5 cm partially calcified extra-axial lesion about the right-side of the vertex of the falx (axial image 29, series 2, coronal image 29, series 4, similar to the 03/2015 examination and again favored to represent a meningioma. No associated mass effect. No intraparenchymal mass. Vascular: No hyperdense vessel or unexpected calcification. Skull: No displaced calvarial fracture. Sinuses/Orbits: Underpneumatization the bilateral frontal sinuses. The remaining paranasal sinuses and mastoid air cells are normally aerated. No air-fluid levels. Other: Regional soft tissues appear normal. IMPRESSION: 1. Similar findings of centralized atrophy without acute intracranial process. 2. Similar findings of exuberant calcifications about the midline of the falx with a suspected approximately 1.8 cm meningioma arising from the right side of the vertex of the falx, similar to the 03/2015 examination and without associated mass effect. Electronically Signed    By: Sandi Mariscal M.D.   On: 04/15/2016 19:16       Scheduled Meds: . Chlorhexidine Gluconate Cloth  6 each Topical Q0600  . heparin  5,000 Units Subcutaneous Q8H  . multivitamin with minerals  1 tablet Oral Daily  . mupirocin ointment  1 application Nasal BID  . OLANZapine  20 mg Oral QHS  . OLANZapine  5 mg Oral QHS  . thiamine  50 mg Oral Daily   Continuous Infusions:     LOS: 0 days    Time spent: 30 minutes    Rexene Alberts, MD [facility] Pager [phone]   If 7PM-7AM, please contact night-coverage [email or web address] Password [facility] 04/17/2016, 3:14 PM

## 2016-04-18 DIAGNOSIS — F29 Unspecified psychosis not due to a substance or known physiological condition: Secondary | ICD-10-CM

## 2016-04-18 DIAGNOSIS — N184 Chronic kidney disease, stage 4 (severe): Secondary | ICD-10-CM

## 2016-04-18 DIAGNOSIS — D6489 Other specified anemias: Secondary | ICD-10-CM

## 2016-04-18 DIAGNOSIS — E871 Hypo-osmolality and hyponatremia: Secondary | ICD-10-CM | POA: Diagnosis not present

## 2016-04-18 DIAGNOSIS — R410 Disorientation, unspecified: Secondary | ICD-10-CM | POA: Diagnosis not present

## 2016-04-18 DIAGNOSIS — N179 Acute kidney failure, unspecified: Secondary | ICD-10-CM

## 2016-04-18 DIAGNOSIS — D329 Benign neoplasm of meninges, unspecified: Secondary | ICD-10-CM

## 2016-04-18 LAB — BASIC METABOLIC PANEL
Anion gap: 5 (ref 5–15)
BUN: 46 mg/dL — AB (ref 6–20)
CALCIUM: 9.3 mg/dL (ref 8.9–10.3)
CO2: 23 mmol/L (ref 22–32)
Chloride: 118 mmol/L — ABNORMAL HIGH (ref 101–111)
Creatinine, Ser: 2.97 mg/dL — ABNORMAL HIGH (ref 0.44–1.00)
GFR calc Af Amer: 18 mL/min — ABNORMAL LOW (ref >60)
GFR, EST NON AFRICAN AMERICAN: 15 mL/min — AB (ref >60)
GLUCOSE: 105 mg/dL — AB (ref 65–99)
Potassium: 4.6 mmol/L (ref 3.5–5.1)
Sodium: 146 mmol/L — ABNORMAL HIGH (ref 135–145)

## 2016-04-18 MED ORDER — FUROSEMIDE 20 MG PO TABS: 20.0000 mg | ORAL_TABLET | Freq: Every day | ORAL | Status: DC

## 2016-04-18 MED ORDER — METOPROLOL TARTRATE 25 MG PO TABS: 25.0000 mg | ORAL_TABLET | Freq: Two times a day (BID) | ORAL | 0 refills | Status: DC

## 2016-04-18 NOTE — Care Management Important Message (Signed)
Important Message  Patient Details  Name: Madison [name] MRN: [id number] Date of Birth: 10/28/1950   Medicare Important Message Given:  Yes    Leniya Breit, Chauncey Reading, RN 04/18/2016, 9:41 AM

## 2016-04-18 NOTE — Discharge Summary (Signed)
Physician Discharge Summary  Madison [name] [id number] DOB: Oct 04, 1950 DOA: 04/15/2016  PCP: Elyn Peers, MD  Admit date: 04/15/2016 Discharge date: 04/18/2016  Admitted From: Home  Disposition:  Home  Recommendations for Outpatient Follow-up:  1. Follow up with PCP in 1-2 weeks 2. Please obtain BMP/CBC in one week 3. Follow-up with outpatient psychiatry. 4. Follow-up with outpatient neurology for further evaluation on meningioma.   Home Health: [facility] Equipment/Devices: None  Discharge Condition: Stable CODE STATUS: Full Diet recommendation: Heart Healthy   Brief/Interim Summary: 33 yof with hx of schizophrenia, CKD, and chronic anxiety, presented with complaints of confusion and hallucinations. She has completed a course of abx recently for PNA. While in the ED, she was afebrile and hemodynamically stable. CT head show no acute findings, but was suspicious for a 1.8 cm meningioma arising from the right side of the vertex. CXR unremarkable, sodium 125, Cr 3.02, and Hgb 10.0. Urine drug screen positive for opiates. Sodium has since improved with IV fluids. UA unremarkable. She was admitted for further management.  Discharge Diagnoses:  Principal Problem:   Acute psychosis Active Problems:   Hyponatremia   AKI (acute kidney injury) ([place])   CKD (chronic kidney disease) stage 4   Anemia due to other cause   Meningioma ([facility])   Hypernatremia  Patient was admitted for acute encephalopathy with acute psychosis. Etiology of her confusion felt to be due to hyponatremia in the setting of schizophrenia. Underlying schizophrenia noted to be treated chronically with Zyprexa, which was continued.   Psychiatry was consulted and recommended continuation of her Zyprexa and follow-up with outpatient psychiatrist. It was not felt that she needed inpatient psychiatric treatment. She denied any auditory and visual hallucinations during hospitalization. By discharge, her mental status appeared  to be back to baseline 1. Hyponatremia, resolved with IV fluids. Her lasix was held on admission and she was hydrated. Since sodium has improved, will restart lasix in 3 days.   2. AKI superimposed on CKD Stage IV. Creatinine improved with IVF. Restart lasix in 3 days. Po intake at home appears to be adequate 3. Normocytic anemia. Hgb noted to have mild decrease in the setting of hydration. Iron and ferritin within normal limit with B12 of 1021. Likely related to chronic renal disease. No evidence of bleeding 4. Apparent meningioma, revealed on CT of the head on admission. CT revealed a 1.8 cm suspected meningioma arising from the right side of the vertex of the faux; no mass effect. This can be followed as an outpatient 5. HTN. Blood pressure noted to be elevated during admission. Patient started on metoprolol. Continue to titrate meds as an outpatient  Discharge Instructions      Discharge Instructions    Diet - low sodium heart healthy    Complete by:  As directed   Face-to-face encounter (required for Medicare/Medicaid patients)    Complete by:  As directed   I Garvis Downum certify that this patient is under my care and that I, or a nurse practitioner or physician's assistant working with me, had a face-to-face encounter that meets the physician face-to-face encounter requirements with this patient on 04/18/2016. The encounter with the patient was in whole, or in part for the following medical condition(s) which is the primary reason for home health care (List medical condition): schizophrenia, needs assistance with meds   The encounter with the patient was in whole, or in part, for the following medical condition, which is the primary reason for home health care:  schizophrenia  I certify that, based on my findings, the following services are medically necessary home health services:  Nursing   Reason for Medically Necessary Home Health Services:  Skilled Nursing- Skilled  Assessment/Observation   My clinical findings support the need for the above services:  Cognitive impairments, dementia, or mental confusion  that make it unsafe to leave home   Further, I certify that my clinical findings support that this patient is homebound due to:  Mental confusion   Home Health    Complete by:  As directed   To provide the following care/treatments:   RN Social work     Increase activity slowly    Complete by:  As directed       Medication List    STOP taking these medications   amoxicillin-clavulanate 500-125 MG tablet Commonly known as:  AUGMENTIN  CHERATUSSIN AC 100-10 MG/5ML syrup Generic drug:  guaiFENesin-codeine    TAKE these medications   furosemide 20 MG tablet Commonly known as:  LASIX Take 1 tablet (20 mg total) by mouth daily. Restart in 3 days What changed:  additional instructions  gabapentin 100 MG capsule Commonly known as:  NEURONTIN Take 100-200 mg by mouth 2 (two) times daily. Pt takes one tablet in the AM and 2 tablets at bedtime  metoprolol tartrate 25 MG tablet Commonly known as:  LOPRESSOR Take 1 tablet (25 mg total) by mouth 2 (two) times daily.  multivitamin tablet Take 1 tablet by mouth daily.  OLANZapine 20 MG tablet Commonly known as:  ZYPREXA Take 20 mg by mouth at bedtime.  OLANZapine 5 MG tablet Commonly known as:  ZYPREXA Take 5 mg by mouth at bedtime.  thiamine 50 MG tablet Commonly known as:  VITAMIN B-1 Take 50 mg by mouth daily.      No Known Allergies  Consultations:  Psychiatry   Procedures/Studies: Imaging Results   Dg Chest 2 View  Result Date: 04/15/2016 CLINICAL DATA:  Cough and altered mental status. EXAM: CHEST  2 VIEW COMPARISON:  04/12/2016 and prior radiographs FINDINGS: Upper limits normal heart size noted. There is no evidence of focal airspace disease, pulmonary edema, suspicious pulmonary nodule/mass, pleural effusion, or pneumothorax. No acute bony abnormalities are  identified. IMPRESSION: Upper limits of normal heart size without evidence of active cardiopulmonary disease. Electronically Signed   By: Margarette Canada M.D.   On: 04/15/2016 18:20  Dg Chest 2 View  Result Date: 04/12/2016 CLINICAL DATA:  Cough.  Follow-up pneumonia . EXAM: CHEST  2 VIEW COMPARISON:  04/19/2015 . FINDINGS: Mediastinum hilar structures normal. Lungs are clear. Small bilateral pleural effusions. No pleural effusion or pneumothorax. Borderline cardiomegaly. No acute bony abnormality. IMPRESSION: .  1. Small bilateral pleural effusions. 2. No acute pulmonary disease. Electronically Signed   By: Marcello Moores  Register   On: 04/12/2016 14:17   Ct Head Wo Contrast  Result Date: 04/15/2016 CLINICAL DATA:  Increased confusion.  History of schizophrenia. EXAM: CT HEAD WITHOUT CONTRAST TECHNIQUE: Contiguous axial images were obtained from the base of the skull through the vertex without intravenous contrast. COMPARISON:  04/18/2009 FINDINGS: Brain: Similar findings of atrophy with centralized volume loss with commensurate ex vacuo dilatation of the ventricular system. Gray-white differentiation is maintained without CT evidence of acute large territory infarct. No intraparenchymal or extra-axial hemorrhage. No midline shift. Re- demonstrated exuberant calcifications about the midline of the falx with a suspected approximately 1.8 x 1.6 x 1.5 cm partially calcified extra-axial lesion about the right-side of the vertex of the falx (  axial image 29, series 2, coronal image 29, series 4, similar to the 03/2015 examination and again favored to represent a meningioma. No associated mass effect. No intraparenchymal mass. Vascular: No hyperdense vessel or unexpected calcification. Skull: No displaced calvarial fracture. Sinuses/Orbits: Underpneumatization the bilateral frontal sinuses. The remaining paranasal sinuses and mastoid air cells are normally aerated. No air-fluid levels. Other: Regional soft tissues appear  normal. IMPRESSION: 1. Similar findings of centralized atrophy without acute intracranial process. 2. Similar findings of exuberant calcifications about the midline of the falx with a suspected approximately 1.8 cm meningioma arising from the right side of the vertex of the falx, similar to the 03/2015 examination and without associated mass effect. Electronically Signed   By: Sandi Mariscal M.D.   On: 04/15/2016 19:16       Subjective: no new complaints. No chest pain or shortness of breath  Discharge Exam:     Vitals:   04/17/16 2345 04/18/16 0412  BP: (!) 165/69 (!) 151/77  Pulse: 65 (!) 102  Resp: 20 20  Temp: 98 F (36.7 C) 98 F (36.7 C)         Vitals:   04/17/16 1831 04/17/16 2027 04/17/16 2345 04/18/16 0412  BP: (!) 174/68  (!) 165/69 (!) 151/77  Pulse: 79  65 (!) 102  Resp:   20 20  Temp:   98 F (36.7 C) 98 F (36.7 C)  TempSrc:   Oral Oral  SpO2: 99% 99% 100% 100%  Weight:      Height:        General: Pt is alert, awake, not in acute distress Cardiovascular: RRR, S1/S2 +, no rubs, no gallops Respiratory: CTA bilaterally, no wheezing, no rhonchi Abdominal: Soft, NT, ND, bowel sounds + Extremities: no edema, no cyanosis     The results of significant diagnostics from this hospitalization (including imaging, microbiology, ancillary and laboratory) are listed below for reference.     Microbiology:        Recent Results (from the past 240 hour(s))  MRSA PCR Screening     Status: Abnormal   Collection Time: 04/15/16 11:34 PM  Result Value Ref Range Status   MRSA by PCR POSITIVE (A) NEGATIVE Final    Comment:        The GeneXpert MRSA Assay (FDA approved for NASAL specimens only), is one component of a comprehensive MRSA colonization surveillance program. It is not intended to diagnose MRSA infection nor to guide or monitor treatment for MRSA infections. RESULT CALLED TO, READ BACK BY AND VERIFIED WITH:   HAMILTON,S @ 0410 ON 04/16/16 BY WOODIE,J     Labs: BNP (last 3 results) Recent Labs (within last 365 days)  No results for input(s): BNP in the last 8760 hours.   Basic Metabolic Panel:  Last Labs    Recent Labs Lab 04/15/16 1913 04/16/16 0515 04/17/16 0440 04/17/16 0802 04/18/16 0605  NA 125* 139 149* 149* 146*  K 3.6 3.9 4.7 4.5 4.6  CL 97* 108 120* 123* 118*  CO2 21* 23 23 23 23   GLUCOSE 104* 106* 98 99 105*  BUN 58* 53* 60* 60* 46*  CREATININE 3.02* 2.74* 3.09* 3.18* 2.97*  CALCIUM 8.5* 8.8* 9.2 9.3 9.3     Liver Function Tests:  Last Labs    Recent Labs Lab 04/15/16 1913 04/16/16 0515  AST 27 21  ALT 13* 11*  ALKPHOS 60 50  BILITOT 0.6 0.2*  PROT 6.4* 5.8*  ALBUMIN 3.4* 2.9*     Last  Labs   No results for input(s): LIPASE, AMYLASE in the last 168 hours.   Last Labs   No results for input(s): AMMONIA in the last 168 hours.   CBC:  Last Labs    Recent Labs Lab 04/15/16 1913 04/16/16 0515 04/17/16 0440  WBC 4.7 3.2* 3.8*  NEUTROABS 3.0  --   --   HGB 10.0* 9.2* 9.7*  HCT 29.0* 27.0* 28.8*  MCV 87.1 88.5 88.9  PLT 194 207 223     Cardiac Enzymes: Last Labs   No results for input(s): CKTOTAL, CKMB, CKMBINDEX, TROPONINI in the last 168 hours.   BNP: Last Labs   Invalid input(s): POCBNP   CBG: Last Labs   No results for input(s): GLUCAP in the last 168 hours.   D-Dimer Recent Labs (last 2 labs)   No results for input(s): DDIMER in the last 72 hours.   Hgb A1c Recent Labs (last 2 labs)   No results for input(s): HGBA1C in the last 72 hours.   Lipid Profile Recent Labs (last 2 labs)   No results for input(s): CHOL, HDL, LDLCALC, TRIG, CHOLHDL, LDLDIRECT in the last 72 hours.   Thyroid function studies  Recent Labs (last 2 labs)    Recent Labs  04/16/16 0515  TSH 1.937     Anemia work up  Recent Labs (last 2 labs)    Recent Labs  04/16/16 0515 04/16/16 1505  VITAMINB12 1,021*  --   FERRITIN  --  58   TIBC  --  220*  IRON  --  49     Urinalysis Labs (Brief)          Component Value Date/Time   COLORURINE YELLOW 04/15/2016 1752   APPEARANCEUR CLEAR 04/15/2016 1752   LABSPEC <1.005 (L) 04/15/2016 1752   PHURINE 6.0 04/15/2016 1752   GLUCOSEU NEGATIVE 04/15/2016 1752   HGBUR SMALL (A) 04/15/2016 1752   BILIRUBINUR NEGATIVE 04/15/2016 1752   KETONESUR NEGATIVE 04/15/2016 1752   PROTEINUR 100 (A) 04/15/2016 1752   NITRITE NEGATIVE 04/15/2016 1752   LEUKOCYTESUR LARGE (A) 04/15/2016 1752     Sepsis Labs Last Labs   Invalid input(s): PROCALCITONIN,  WBC,  LACTICIDVEN   Microbiology        Recent Results (from the past 240 hour(s))  MRSA PCR Screening     Status: Abnormal   Collection Time: 04/15/16 11:34 PM  Result Value Ref Range Status   MRSA by PCR POSITIVE (A) NEGATIVE Final    Comment:        The GeneXpert MRSA Assay (FDA approved for NASAL specimens only), is one component of a comprehensive MRSA colonization surveillance program. It is not intended to diagnose MRSA infection nor to guide or monitor treatment for MRSA infections. RESULT CALLED TO, READ BACK BY AND VERIFIED WITH:  HAMILTON,S @ 0410 ON 04/16/16 BY [place] Planas     Time coordinating discharge: Over 30 minutes  SIGNED:   Kathie Dike, MD [facility] 04/18/2016, 11:17 AM If 7PM-7AM, please contact night-coverage [email or web address] Password [email or web address]

## 2016-04-18 NOTE — Care Management Note (Signed)
Case Management Note  Patient Details  Name: Madison [name] MRN: [id number] Date of Birth: [birth year]   Additional Comments: Spoke with [name], (sister) who is aware of [place] orders and DC today. Made aware [facility] has 48 hours to make first visit.   Jaydian Santana, Chauncey Reading, RN 04/18/2016, 11:21 AM

## 2016-04-18 NOTE — Progress Notes (Signed)
Patient discharged home with sister.  IV removed and site intact.  Patient sent home with belongings, discharge papers, and prescriptions.

## 2016-04-25 ENCOUNTER — Other Ambulatory Visit (HOSPITAL_COMMUNITY)
Admission: RE | Admit: 2016-04-25 | Discharge: 2016-04-25 | Disposition: A | Discharge status: Home / Self Care | Payer: Medicare Other | Source: Other Acute Inpatient Hospital | Attending: Internal Medicine | Admitting: Internal Medicine

## 2016-04-25 DIAGNOSIS — E871 Hypo-osmolality and hyponatremia: Secondary | ICD-10-CM | POA: Insufficient documentation

## 2016-04-25 DIAGNOSIS — F29 Unspecified psychosis not due to a substance or known physiological condition: Secondary | ICD-10-CM | POA: Diagnosis present

## 2016-04-25 LAB — BASIC METABOLIC PANEL
ANION GAP: 8 (ref 5–15)
BUN: 57 mg/dL — ABNORMAL HIGH (ref 6–20)
CALCIUM: 8.3 mg/dL — AB (ref 8.9–10.3)
CO2: 23 mmol/L (ref 22–32)
CREATININE: 3.1 mg/dL — AB (ref 0.44–1.00)
Chloride: 92 mmol/L — ABNORMAL LOW (ref 101–111)
GFR, EST AFRICAN AMERICAN: 17 mL/min — AB (ref >60)
GFR, EST NON AFRICAN AMERICAN: 15 mL/min — AB (ref >60)
Glucose, Bld: 96 mg/dL (ref 65–99)
Potassium: 4.2 mmol/L (ref 3.5–5.1)
Sodium: 123 mmol/L — ABNORMAL LOW (ref 135–145)

## 2016-04-25 LAB — CBC
HEMATOCRIT: 27.7 % — AB (ref 36.0–46.0)
HEMOGLOBIN: 9.4 g/dL — AB (ref 12.0–15.0)
MCH: 29.9 pg (ref 26.0–34.0)
MCHC: 33.9 g/dL (ref 30.0–36.0)
MCV: 88.2 fL (ref 78.0–100.0)
Platelets: 187 10*3/uL (ref 150–400)
RBC: 3.14 MIL/uL — ABNORMAL LOW (ref 3.87–5.11)
RDW: 13.8 % (ref 11.5–15.5)
WBC: 3.7 10*3/uL — ABNORMAL LOW (ref 4.0–10.5)

## 2016-10-01 DIAGNOSIS — Z01818 Encounter for other preprocedural examination: Secondary | ICD-10-CM | POA: Diagnosis not present

## 2016-10-03 DIAGNOSIS — N251 Nephrogenic diabetes insipidus: Secondary | ICD-10-CM | POA: Diagnosis not present

## 2016-10-03 DIAGNOSIS — N250 Renal osteodystrophy: Secondary | ICD-10-CM | POA: Diagnosis not present

## 2016-10-03 DIAGNOSIS — E875 Hyperkalemia: Secondary | ICD-10-CM | POA: Diagnosis not present

## 2016-10-03 DIAGNOSIS — R609 Edema, unspecified: Secondary | ICD-10-CM | POA: Diagnosis not present

## 2016-10-03 DIAGNOSIS — N185 Chronic kidney disease, stage 5: Secondary | ICD-10-CM | POA: Diagnosis not present

## 2016-10-03 DIAGNOSIS — I1310 Hypertensive heart and chronic kidney disease without heart failure, with stage 1 through stage 4 chronic kidney disease, or unspecified chronic kidney disease: Secondary | ICD-10-CM | POA: Diagnosis not present

## 2016-10-18 DIAGNOSIS — F3132 Bipolar disorder, current episode depressed, moderate: Secondary | ICD-10-CM | POA: Diagnosis not present

## 2016-10-26 DIAGNOSIS — Z79899 Other long term (current) drug therapy: Secondary | ICD-10-CM | POA: Diagnosis not present

## 2016-10-26 DIAGNOSIS — I120 Hypertensive chronic kidney disease with stage 5 chronic kidney disease or end stage renal disease: Secondary | ICD-10-CM | POA: Diagnosis not present

## 2016-10-26 DIAGNOSIS — N186 End stage renal disease: Secondary | ICD-10-CM | POA: Diagnosis not present

## 2016-11-05 DIAGNOSIS — N185 Chronic kidney disease, stage 5: Secondary | ICD-10-CM | POA: Diagnosis not present

## 2016-11-05 DIAGNOSIS — N184 Chronic kidney disease, stage 4 (severe): Secondary | ICD-10-CM | POA: Diagnosis not present

## 2016-11-05 DIAGNOSIS — D631 Anemia in chronic kidney disease: Secondary | ICD-10-CM | POA: Diagnosis not present

## 2016-11-19 DIAGNOSIS — G3184 Mild cognitive impairment, so stated: Secondary | ICD-10-CM | POA: Diagnosis not present

## 2016-11-19 DIAGNOSIS — F319 Bipolar disorder, unspecified: Secondary | ICD-10-CM | POA: Diagnosis not present

## 2016-11-19 DIAGNOSIS — N183 Chronic kidney disease, stage 3 (moderate): Secondary | ICD-10-CM | POA: Diagnosis not present

## 2016-11-19 DIAGNOSIS — D329 Benign neoplasm of meninges, unspecified: Secondary | ICD-10-CM | POA: Diagnosis not present

## 2016-11-22 DIAGNOSIS — N183 Chronic kidney disease, stage 3 (moderate): Secondary | ICD-10-CM | POA: Diagnosis not present

## 2016-12-07 DIAGNOSIS — G8918 Other acute postprocedural pain: Secondary | ICD-10-CM | POA: Diagnosis not present

## 2016-12-07 DIAGNOSIS — Z794 Long term (current) use of insulin: Secondary | ICD-10-CM | POA: Diagnosis not present

## 2016-12-07 DIAGNOSIS — Z992 Dependence on renal dialysis: Secondary | ICD-10-CM | POA: Diagnosis not present

## 2016-12-07 DIAGNOSIS — M79602 Pain in left arm: Secondary | ICD-10-CM | POA: Diagnosis not present

## 2016-12-07 DIAGNOSIS — E1122 Type 2 diabetes mellitus with diabetic chronic kidney disease: Secondary | ICD-10-CM | POA: Diagnosis not present

## 2016-12-07 DIAGNOSIS — I120 Hypertensive chronic kidney disease with stage 5 chronic kidney disease or end stage renal disease: Secondary | ICD-10-CM | POA: Diagnosis not present

## 2016-12-07 DIAGNOSIS — N186 End stage renal disease: Secondary | ICD-10-CM | POA: Diagnosis not present

## 2016-12-10 DIAGNOSIS — D631 Anemia in chronic kidney disease: Secondary | ICD-10-CM | POA: Diagnosis not present

## 2016-12-10 DIAGNOSIS — N185 Chronic kidney disease, stage 5: Secondary | ICD-10-CM | POA: Diagnosis not present

## 2016-12-10 DIAGNOSIS — N184 Chronic kidney disease, stage 4 (severe): Secondary | ICD-10-CM | POA: Diagnosis not present

## 2016-12-13 DIAGNOSIS — F259 Schizoaffective disorder, unspecified: Secondary | ICD-10-CM | POA: Diagnosis not present

## 2016-12-25 DIAGNOSIS — N186 End stage renal disease: Secondary | ICD-10-CM | POA: Diagnosis not present

## 2016-12-25 DIAGNOSIS — N183 Chronic kidney disease, stage 3 (moderate): Secondary | ICD-10-CM | POA: Diagnosis not present

## 2017-01-02 DIAGNOSIS — R609 Edema, unspecified: Secondary | ICD-10-CM | POA: Diagnosis not present

## 2017-01-02 DIAGNOSIS — D649 Anemia, unspecified: Secondary | ICD-10-CM | POA: Diagnosis not present

## 2017-01-02 DIAGNOSIS — I1310 Hypertensive heart and chronic kidney disease without heart failure, with stage 1 through stage 4 chronic kidney disease, or unspecified chronic kidney disease: Secondary | ICD-10-CM | POA: Diagnosis not present

## 2017-01-02 DIAGNOSIS — N185 Chronic kidney disease, stage 5: Secondary | ICD-10-CM | POA: Diagnosis not present

## 2017-01-02 DIAGNOSIS — N250 Renal osteodystrophy: Secondary | ICD-10-CM | POA: Diagnosis not present

## 2017-01-02 DIAGNOSIS — D631 Anemia in chronic kidney disease: Secondary | ICD-10-CM | POA: Diagnosis not present

## 2017-01-02 DIAGNOSIS — N184 Chronic kidney disease, stage 4 (severe): Secondary | ICD-10-CM | POA: Diagnosis not present

## 2017-01-02 DIAGNOSIS — E875 Hyperkalemia: Secondary | ICD-10-CM | POA: Diagnosis not present

## 2017-01-21 DIAGNOSIS — N185 Chronic kidney disease, stage 5: Secondary | ICD-10-CM | POA: Diagnosis not present

## 2017-01-21 DIAGNOSIS — I1310 Hypertensive heart and chronic kidney disease without heart failure, with stage 1 through stage 4 chronic kidney disease, or unspecified chronic kidney disease: Secondary | ICD-10-CM | POA: Diagnosis not present

## 2017-01-30 DIAGNOSIS — N185 Chronic kidney disease, stage 5: Secondary | ICD-10-CM | POA: Diagnosis not present

## 2017-02-19 DIAGNOSIS — N183 Chronic kidney disease, stage 3 (moderate): Secondary | ICD-10-CM | POA: Diagnosis not present

## 2017-02-19 DIAGNOSIS — I1310 Hypertensive heart and chronic kidney disease without heart failure, with stage 1 through stage 4 chronic kidney disease, or unspecified chronic kidney disease: Secondary | ICD-10-CM | POA: Diagnosis not present

## 2017-02-21 DIAGNOSIS — Z992 Dependence on renal dialysis: Secondary | ICD-10-CM | POA: Diagnosis not present

## 2017-02-21 DIAGNOSIS — N186 End stage renal disease: Secondary | ICD-10-CM | POA: Diagnosis not present

## 2017-02-21 DIAGNOSIS — Z79899 Other long term (current) drug therapy: Secondary | ICD-10-CM | POA: Diagnosis not present

## 2017-02-21 DIAGNOSIS — I120 Hypertensive chronic kidney disease with stage 5 chronic kidney disease or end stage renal disease: Secondary | ICD-10-CM | POA: Diagnosis not present

## 2017-03-04 DIAGNOSIS — N184 Chronic kidney disease, stage 4 (severe): Secondary | ICD-10-CM | POA: Diagnosis not present

## 2017-03-04 DIAGNOSIS — N185 Chronic kidney disease, stage 5: Secondary | ICD-10-CM | POA: Diagnosis not present

## 2017-03-04 DIAGNOSIS — D631 Anemia in chronic kidney disease: Secondary | ICD-10-CM | POA: Diagnosis not present

## 2017-03-12 DIAGNOSIS — F259 Schizoaffective disorder, unspecified: Secondary | ICD-10-CM | POA: Diagnosis not present

## 2017-03-21 DIAGNOSIS — N186 End stage renal disease: Secondary | ICD-10-CM | POA: Diagnosis not present

## 2017-03-21 DIAGNOSIS — N183 Chronic kidney disease, stage 3 (moderate): Secondary | ICD-10-CM | POA: Diagnosis not present

## 2017-04-01 DIAGNOSIS — D649 Anemia, unspecified: Secondary | ICD-10-CM | POA: Diagnosis not present

## 2017-04-01 DIAGNOSIS — N185 Chronic kidney disease, stage 5: Secondary | ICD-10-CM | POA: Diagnosis not present

## 2017-04-16 DIAGNOSIS — F259 Schizoaffective disorder, unspecified: Secondary | ICD-10-CM | POA: Diagnosis not present

## 2017-05-01 DIAGNOSIS — E872 Acidosis: Secondary | ICD-10-CM | POA: Diagnosis not present

## 2017-05-01 DIAGNOSIS — R609 Edema, unspecified: Secondary | ICD-10-CM | POA: Diagnosis not present

## 2017-05-01 DIAGNOSIS — E875 Hyperkalemia: Secondary | ICD-10-CM | POA: Diagnosis not present

## 2017-05-01 DIAGNOSIS — N184 Chronic kidney disease, stage 4 (severe): Secondary | ICD-10-CM | POA: Diagnosis not present

## 2017-05-01 DIAGNOSIS — I1310 Hypertensive heart and chronic kidney disease without heart failure, with stage 1 through stage 4 chronic kidney disease, or unspecified chronic kidney disease: Secondary | ICD-10-CM | POA: Diagnosis not present

## 2017-05-01 DIAGNOSIS — N250 Renal osteodystrophy: Secondary | ICD-10-CM | POA: Diagnosis not present

## 2017-05-01 DIAGNOSIS — D631 Anemia in chronic kidney disease: Secondary | ICD-10-CM | POA: Diagnosis not present

## 2017-05-01 DIAGNOSIS — N185 Chronic kidney disease, stage 5: Secondary | ICD-10-CM | POA: Diagnosis not present

## 2017-05-20 DIAGNOSIS — G3184 Mild cognitive impairment, so stated: Secondary | ICD-10-CM | POA: Diagnosis not present

## 2017-05-20 DIAGNOSIS — D329 Benign neoplasm of meninges, unspecified: Secondary | ICD-10-CM | POA: Diagnosis not present

## 2017-05-20 DIAGNOSIS — F319 Bipolar disorder, unspecified: Secondary | ICD-10-CM | POA: Diagnosis not present

## 2017-05-20 DIAGNOSIS — G4700 Insomnia, unspecified: Secondary | ICD-10-CM | POA: Diagnosis not present

## 2017-05-28 DIAGNOSIS — D631 Anemia in chronic kidney disease: Secondary | ICD-10-CM | POA: Diagnosis not present

## 2017-05-28 DIAGNOSIS — F259 Schizoaffective disorder, unspecified: Secondary | ICD-10-CM | POA: Diagnosis not present

## 2017-05-28 DIAGNOSIS — N185 Chronic kidney disease, stage 5: Secondary | ICD-10-CM | POA: Diagnosis not present

## 2017-06-25 DIAGNOSIS — Z23 Encounter for immunization: Secondary | ICD-10-CM | POA: Diagnosis not present

## 2017-06-25 DIAGNOSIS — N185 Chronic kidney disease, stage 5: Secondary | ICD-10-CM | POA: Diagnosis not present

## 2017-06-25 DIAGNOSIS — N183 Chronic kidney disease, stage 3 (moderate): Secondary | ICD-10-CM | POA: Diagnosis not present

## 2017-06-25 DIAGNOSIS — I1310 Hypertensive heart and chronic kidney disease without heart failure, with stage 1 through stage 4 chronic kidney disease, or unspecified chronic kidney disease: Secondary | ICD-10-CM | POA: Diagnosis not present

## 2017-06-25 DIAGNOSIS — D631 Anemia in chronic kidney disease: Secondary | ICD-10-CM | POA: Diagnosis not present

## 2017-06-25 DIAGNOSIS — N184 Chronic kidney disease, stage 4 (severe): Secondary | ICD-10-CM | POA: Diagnosis not present

## 2017-06-25 DIAGNOSIS — D649 Anemia, unspecified: Secondary | ICD-10-CM | POA: Diagnosis not present

## 2017-06-25 DIAGNOSIS — N179 Acute kidney failure, unspecified: Secondary | ICD-10-CM | POA: Diagnosis not present

## 2017-07-09 DIAGNOSIS — F331 Major depressive disorder, recurrent, moderate: Secondary | ICD-10-CM | POA: Diagnosis not present

## 2017-07-29 DIAGNOSIS — D649 Anemia, unspecified: Secondary | ICD-10-CM | POA: Diagnosis not present

## 2017-07-29 DIAGNOSIS — N184 Chronic kidney disease, stage 4 (severe): Secondary | ICD-10-CM | POA: Diagnosis not present

## 2017-07-29 DIAGNOSIS — I1310 Hypertensive heart and chronic kidney disease without heart failure, with stage 1 through stage 4 chronic kidney disease, or unspecified chronic kidney disease: Secondary | ICD-10-CM | POA: Diagnosis not present

## 2017-07-29 DIAGNOSIS — N250 Renal osteodystrophy: Secondary | ICD-10-CM | POA: Diagnosis not present

## 2017-07-29 DIAGNOSIS — D631 Anemia in chronic kidney disease: Secondary | ICD-10-CM | POA: Diagnosis not present

## 2017-07-29 DIAGNOSIS — F319 Bipolar disorder, unspecified: Secondary | ICD-10-CM | POA: Diagnosis not present

## 2017-07-29 DIAGNOSIS — N185 Chronic kidney disease, stage 5: Secondary | ICD-10-CM | POA: Diagnosis not present

## 2017-07-29 DIAGNOSIS — E872 Acidosis: Secondary | ICD-10-CM | POA: Diagnosis not present

## 2017-07-29 DIAGNOSIS — R609 Edema, unspecified: Secondary | ICD-10-CM | POA: Diagnosis not present

## 2017-07-29 DIAGNOSIS — Z1159 Encounter for screening for other viral diseases: Secondary | ICD-10-CM | POA: Diagnosis not present

## 2017-08-05 DIAGNOSIS — D631 Anemia in chronic kidney disease: Secondary | ICD-10-CM | POA: Diagnosis not present

## 2017-08-05 DIAGNOSIS — N251 Nephrogenic diabetes insipidus: Secondary | ICD-10-CM | POA: Diagnosis not present

## 2017-08-05 DIAGNOSIS — D509 Iron deficiency anemia, unspecified: Secondary | ICD-10-CM | POA: Diagnosis not present

## 2017-08-05 DIAGNOSIS — N2581 Secondary hyperparathyroidism of renal origin: Secondary | ICD-10-CM | POA: Diagnosis not present

## 2017-08-05 DIAGNOSIS — Z4931 Encounter for adequacy testing for hemodialysis: Secondary | ICD-10-CM | POA: Diagnosis not present

## 2017-08-05 DIAGNOSIS — N186 End stage renal disease: Secondary | ICD-10-CM | POA: Diagnosis not present

## 2017-08-07 DIAGNOSIS — D631 Anemia in chronic kidney disease: Secondary | ICD-10-CM | POA: Diagnosis not present

## 2017-08-07 DIAGNOSIS — N186 End stage renal disease: Secondary | ICD-10-CM | POA: Diagnosis not present

## 2017-08-07 DIAGNOSIS — N2581 Secondary hyperparathyroidism of renal origin: Secondary | ICD-10-CM | POA: Diagnosis not present

## 2017-08-07 DIAGNOSIS — N251 Nephrogenic diabetes insipidus: Secondary | ICD-10-CM | POA: Diagnosis not present

## 2017-08-07 DIAGNOSIS — Z4931 Encounter for adequacy testing for hemodialysis: Secondary | ICD-10-CM | POA: Diagnosis not present

## 2017-08-09 DIAGNOSIS — N2581 Secondary hyperparathyroidism of renal origin: Secondary | ICD-10-CM | POA: Diagnosis not present

## 2017-08-09 DIAGNOSIS — N251 Nephrogenic diabetes insipidus: Secondary | ICD-10-CM | POA: Diagnosis not present

## 2017-08-09 DIAGNOSIS — D631 Anemia in chronic kidney disease: Secondary | ICD-10-CM | POA: Diagnosis not present

## 2017-08-09 DIAGNOSIS — N186 End stage renal disease: Secondary | ICD-10-CM | POA: Diagnosis not present

## 2017-08-09 DIAGNOSIS — Z4931 Encounter for adequacy testing for hemodialysis: Secondary | ICD-10-CM | POA: Diagnosis not present

## 2017-08-11 DIAGNOSIS — N2581 Secondary hyperparathyroidism of renal origin: Secondary | ICD-10-CM | POA: Diagnosis not present

## 2017-08-11 DIAGNOSIS — D631 Anemia in chronic kidney disease: Secondary | ICD-10-CM | POA: Diagnosis not present

## 2017-08-11 DIAGNOSIS — N251 Nephrogenic diabetes insipidus: Secondary | ICD-10-CM | POA: Diagnosis not present

## 2017-08-11 DIAGNOSIS — Z4931 Encounter for adequacy testing for hemodialysis: Secondary | ICD-10-CM | POA: Diagnosis not present

## 2017-08-11 DIAGNOSIS — N186 End stage renal disease: Secondary | ICD-10-CM | POA: Diagnosis not present

## 2017-08-13 DIAGNOSIS — N186 End stage renal disease: Secondary | ICD-10-CM | POA: Diagnosis not present

## 2017-08-13 DIAGNOSIS — Z4931 Encounter for adequacy testing for hemodialysis: Secondary | ICD-10-CM | POA: Diagnosis not present

## 2017-08-13 DIAGNOSIS — D631 Anemia in chronic kidney disease: Secondary | ICD-10-CM | POA: Diagnosis not present

## 2017-08-13 DIAGNOSIS — N251 Nephrogenic diabetes insipidus: Secondary | ICD-10-CM | POA: Diagnosis not present

## 2017-08-13 DIAGNOSIS — N2581 Secondary hyperparathyroidism of renal origin: Secondary | ICD-10-CM | POA: Diagnosis not present

## 2017-08-15 ENCOUNTER — Inpatient Hospital Stay
Admission: EM | Admit: 2017-08-15 | Discharge: 2017-08-18 | DRG: 070 | Disposition: A | Discharge status: Home / Self Care | Payer: Medicare Other | Attending: Internal Medicine | Admitting: Internal Medicine

## 2017-08-15 ENCOUNTER — Other Ambulatory Visit: Payer: Self-pay

## 2017-08-15 ENCOUNTER — Encounter: Payer: Self-pay | Admitting: Emergency Medicine

## 2017-08-15 DIAGNOSIS — R4182 Altered mental status, unspecified: Secondary | ICD-10-CM | POA: Diagnosis present

## 2017-08-15 DIAGNOSIS — N186 End stage renal disease: Secondary | ICD-10-CM | POA: Diagnosis present

## 2017-08-15 DIAGNOSIS — G629 Polyneuropathy, unspecified: Secondary | ICD-10-CM | POA: Diagnosis present

## 2017-08-15 DIAGNOSIS — Z22322 Carrier or suspected carrier of Methicillin resistant Staphylococcus aureus: Secondary | ICD-10-CM

## 2017-08-15 DIAGNOSIS — I120 Hypertensive chronic kidney disease with stage 5 chronic kidney disease or end stage renal disease: Secondary | ICD-10-CM | POA: Diagnosis present

## 2017-08-15 DIAGNOSIS — F319 Bipolar disorder, unspecified: Secondary | ICD-10-CM | POA: Diagnosis present

## 2017-08-15 DIAGNOSIS — Z992 Dependence on renal dialysis: Secondary | ICD-10-CM

## 2017-08-15 DIAGNOSIS — Z23 Encounter for immunization: Secondary | ICD-10-CM | POA: Diagnosis not present

## 2017-08-15 DIAGNOSIS — G9349 Other encephalopathy: Secondary | ICD-10-CM | POA: Diagnosis not present

## 2017-08-15 DIAGNOSIS — F419 Anxiety disorder, unspecified: Secondary | ICD-10-CM | POA: Diagnosis present

## 2017-08-15 DIAGNOSIS — F209 Schizophrenia, unspecified: Secondary | ICD-10-CM | POA: Diagnosis present

## 2017-08-15 DIAGNOSIS — Z9049 Acquired absence of other specified parts of digestive tract: Secondary | ICD-10-CM

## 2017-08-15 DIAGNOSIS — N2581 Secondary hyperparathyroidism of renal origin: Secondary | ICD-10-CM | POA: Diagnosis present

## 2017-08-15 DIAGNOSIS — Z79899 Other long term (current) drug therapy: Secondary | ICD-10-CM | POA: Diagnosis not present

## 2017-08-15 DIAGNOSIS — D631 Anemia in chronic kidney disease: Secondary | ICD-10-CM | POA: Diagnosis present

## 2017-08-15 DIAGNOSIS — I959 Hypotension, unspecified: Secondary | ICD-10-CM | POA: Diagnosis not present

## 2017-08-15 DIAGNOSIS — R9431 Abnormal electrocardiogram [ECG] [EKG]: Secondary | ICD-10-CM | POA: Diagnosis not present

## 2017-08-15 DIAGNOSIS — Z841 Family history of disorders of kidney and ureter: Secondary | ICD-10-CM

## 2017-08-15 DIAGNOSIS — D649 Anemia, unspecified: Secondary | ICD-10-CM | POA: Diagnosis not present

## 2017-08-15 DIAGNOSIS — E871 Hypo-osmolality and hyponatremia: Secondary | ICD-10-CM | POA: Diagnosis not present

## 2017-08-15 HISTORY — DX: Dependence on renal dialysis: Z99.2

## 2017-08-15 HISTORY — DX: Dependence on renal dialysis: N18.6

## 2017-08-15 LAB — COMPREHENSIVE METABOLIC PANEL
ALK PHOS: 87 U/L (ref 38–126)
ALT: 14 U/L (ref 14–54)
ANION GAP: 17 — AB (ref 5–15)
AST: 32 U/L (ref 15–41)
Albumin: 4 g/dL (ref 3.5–5.0)
BUN: 59 mg/dL — ABNORMAL HIGH (ref 6–20)
CALCIUM: 9.2 mg/dL (ref 8.9–10.3)
CO2: 27 mmol/L (ref 22–32)
Chloride: 86 mmol/L — ABNORMAL LOW (ref 101–111)
Creatinine, Ser: 8.52 mg/dL — ABNORMAL HIGH (ref 0.44–1.00)
GFR, EST AFRICAN AMERICAN: 5 mL/min — AB (ref >60)
GFR, EST NON AFRICAN AMERICAN: 4 mL/min — AB (ref >60)
Glucose, Bld: 104 mg/dL — ABNORMAL HIGH (ref 65–99)
Potassium: 4.2 mmol/L (ref 3.5–5.1)
SODIUM: 130 mmol/L — AB (ref 135–145)
TOTAL PROTEIN: 7.2 g/dL (ref 6.5–8.1)
Total Bilirubin: 0.9 mg/dL (ref 0.3–1.2)

## 2017-08-15 LAB — CBC
HCT: 21.6 % — ABNORMAL LOW (ref 35.0–47.0)
HEMOGLOBIN: 7.3 g/dL — AB (ref 12.0–16.0)
MCH: 30.6 pg (ref 26.0–34.0)
MCHC: 33.8 g/dL (ref 32.0–36.0)
MCV: 90.4 fL (ref 80.0–100.0)
PLATELETS: 186 10*3/uL (ref 150–440)
RBC: 2.39 MIL/uL — ABNORMAL LOW (ref 3.80–5.20)
RDW: 14.9 % — ABNORMAL HIGH (ref 11.5–14.5)
WBC: 7.1 10*3/uL (ref 3.6–11.0)

## 2017-08-15 LAB — URINALYSIS, COMPLETE (UACMP) WITH MICROSCOPIC
BILIRUBIN URINE: NEGATIVE
Bacteria, UA: NONE SEEN
GLUCOSE, UA: NEGATIVE mg/dL
HGB URINE DIPSTICK: NEGATIVE
KETONES UR: NEGATIVE mg/dL
NITRITE: NEGATIVE
PH: 8 (ref 5.0–8.0)
Specific Gravity, Urine: 1.007 (ref 1.005–1.030)

## 2017-08-15 LAB — TROPONIN I

## 2017-08-15 MED ORDER — LORAZEPAM 0.5 MG PO TABS: 0.5000 mg | ORAL_TABLET | Freq: Three times a day (TID) | ORAL | 0 refills | Status: DC | PRN

## 2017-08-15 MED ORDER — LORAZEPAM 0.5 MG PO TABS
0.5000 mg | ORAL_TABLET | Freq: Once | ORAL | Status: AC
  Administered 2017-08-15: 0.5 mg via ORAL
  Filled 2017-08-15: qty 1

## 2017-08-15 NOTE — ED Notes (Signed)

## 2017-08-15 NOTE — ED Provider Notes (Addendum)
[facility] Emergency Department Provider Note ____________________________________________   I have reviewed the triage vital signs and the triage nursing note.  HISTORY  Chief Complaint Altered Mental Status   Historian Patient, sister, aunt  HPI Madison [name] is a 66 y.o. female with history of schizophrenia, depression, as well as kidney disease recently progressed to dialysis 2 weeks ago.  Since then family states she's been worsening mental status - they are unsure if she might have low sodium, since she has had that before.  No reported fevers or coughing or abdominal pain or vomiting.  She seems to have become extremely anxious since the relatively new dialysis, and they are concerned that her mental state has decompensated psychiatrically as a result of this medical stressor.  The patient states that she does feel depressed without any suicidal thoughts.  She does state that she is anxious and feels nervous all the time.  Family states that she is crying a lot.    No reported black or bloody stools.     Past Medical History:  Diagnosis Date  . Anxiety   . CKD (chronic kidney disease) stage 3, GFR 30-59 ml/min (HCC)    baseline cr 2.47  . Neuropathy   . Renal disorder   . Schizophrenia [facility])     Patient Active Problem List   Diagnosis Date Noted  . Hypernatremia 04/17/2016  . CKD (chronic kidney disease) stage 3, GFR 30-59 ml/min (HCC) 04/16/2016  . Anemia due to other cause 04/16/2016  . Meningioma ([place]) 04/16/2016  . Acute psychosis ([place]) 04/15/2016  . Hyponatremia 04/15/2016  . AKI (acute kidney injury) ([place]) 04/15/2016  . Special screening for malignant neoplasms, colon   . Thrombocytopenia ([place]) 01/05/2016  . Altered mental status 04/19/2015    Past Surgical History:  Procedure Laterality Date  . CHOLECYSTECTOMY    . COLONOSCOPY N/A 01/20/2016   Procedure: COLONOSCOPY;  Surgeon: [name], MD;  Location: [facility] ENDO SUITE;  Service:  Endoscopy;  Laterality: N/A;  1:15 PM-moved to 130 Office to notify  . HEMICOLECTOMY Right   . TUBAL LIGATION      Prior to Admission medications   Medication Sig Start Date End Date Taking? Authorizing Provider  furosemide (LASIX) 20 MG tablet Take 1 tablet (20 mg total) by mouth daily. Restart in 3 days 04/18/16   Kathie Dike, MD  gabapentin (NEURONTIN) 100 MG capsule Take 100-200 mg by mouth 2 (two) times daily. Pt takes one tablet in the AM and 2 tablets at bedtime    [provider]  LORazepam (ATIVAN) 0.5 MG tablet Take 1 tablet (0.5 mg total) by mouth every 8 (eight) hours as needed for anxiety. 08/15/17 08/15/18  Lisa Roca, MD  metoprolol tartrate (LOPRESSOR) 25 MG tablet Take 1 tablet (25 mg total) by mouth 2 (two) times daily. 04/18/16   Kathie Dike, MD  Multiple Vitamin (MULTIVITAMIN) tablet Take 1 tablet by mouth daily.    [provider]  OLANZapine (ZYPREXA) 20 MG tablet Take 20 mg by mouth at bedtime.    [provider]  OLANZapine (ZYPREXA) 5 MG tablet Take 5 mg by mouth at bedtime.    [provider]  thiamine (VITAMIN B-1) 50 MG tablet Take 50 mg by mouth daily.    [provider]    No Known Allergies  Family History  Problem Relation Age of Onset  . Alzheimer's disease Mother   . Cancer Father   . Kidney failure Sister  Social History Social History   Tobacco Use  . Smoking status: Never Smoker  . Smokeless tobacco: Never Used  Substance Use Topics  . Alcohol use: No  . Drug use: No    Review of Systems  Constitutional: Negative for fever. Eyes: Negative for visual changes. ENT: Negative for sore throat. Cardiovascular: Negative for chest pain. Respiratory: Negative for shortness of breath. Gastrointestinal: Negative for abdominal pain, vomiting and diarrhea. Genitourinary: Negative for dysuria. Musculoskeletal: Negative for back pain. Skin: Negative for rash. Neurological: Negative for  headache.  ____________________________________________   PHYSICAL EXAM:  VITAL SIGNS: ED Triage Vitals  Enc Vitals Group     BP 08/15/17 1150 138/68     Pulse Rate 08/15/17 1150 94     Resp 08/15/17 1150 16     Temp 08/15/17 1150 98.5 F (36.9 C)     Temp Source 08/15/17 1150 Oral     SpO2 08/15/17 1150 100 %     Weight 08/15/17 1151 121 lb 1 oz (54.9 kg)     Height 08/15/17 1151 5\' 4"  (1.626 m)     Head Circumference --      Peak Flow --      Pain Score --      Pain Loc --      Pain Edu? --      Excl. in [place]? --      Constitutional: Alert and cooperative. Well appearing and in no distress. HEENT   Head: Normocephalic and atraumatic.      Eyes: Conjunctivae are normal. Pupils equal and round.       Ears:         Nose: No congestion/rhinnorhea.   Mouth/Throat: Mucous membranes are moist.   Neck: No stridor. Cardiovascular/Chest: Normal rate, regular rhythm.  No murmurs, rubs, or gallops. Respiratory: Normal respiratory effort without tachypnea nor retractions. Breath sounds are clear and equal bilaterally. No wheezes/rales/rhonchi. Gastrointestinal: Soft. No distention, no guarding, no rebound. Nontender.   Genitourinary/rectal:  Dark stool hemoccult negative Musculoskeletal: Nontender with normal range of motion in all extremities. No joint effusions.  No lower extremity tenderness.  No edema. Neurologic:  Normal speech and language. No gross or focal neurologic deficits are appreciated. Skin:  Skin is warm, dry and intact. No rash noted. Psychiatric: Flat affect.  No suicidal ideation.   ____________________________________________  LABS (pertinent positives/negatives) I, Lisa Roca, MD the attending physician have reviewed the labs noted below.  Labs Reviewed  COMPREHENSIVE METABOLIC PANEL - Abnormal; Notable for the following components:      Result Value   Sodium 130 (*)    Chloride 86 (*)    Glucose, Bld 104 (*)    BUN 59 (*)    Creatinine,  Ser 8.52 (*)    GFR calc non Af Amer 4 (*)    GFR calc Af Amer 5 (*)    Anion gap 17 (*)    All other components within normal limits  CBC - Abnormal; Notable for the following components:   RBC 2.39 (*)    Hemoglobin 7.3 (*)    HCT 21.6 (*)    RDW 14.9 (*)    All other components within normal limits  URINALYSIS, COMPLETE (UACMP) WITH MICROSCOPIC - Abnormal; Notable for the following components:   Color, Urine YELLOW (*)    APPearance HAZY (*)    Protein, ur >=300 (*)    Leukocytes, UA SMALL (*)    Squamous Epithelial / LPF 0-5 (*)    All other components  within normal limits  TROPONIN I    ____________________________________________    EKG I, Lisa Roca, MD, the attending physician have personally viewed and interpreted all ECGs.  95 beats per minute.  normal sinus rhythm.  Narrow QRS and LVH.  Nonspecific ST and T wave ____________________________________________  RADIOLOGY All Xrays were viewed by me.  Imaging interpreted by Radiologist, and I, Lisa Roca, MD the attending physician have reviewed the radiologist interpretation noted below.  None __________________________________________  PROCEDURES  Procedure(s) performed: None  Critical Care performed: None   ____________________________________________  ED COURSE / ASSESSMENT AND PLAN  Pertinent labs & imaging results that were available during my care of the patient were reviewed by me and considered in my medical decision making (see chart for details).    Family is most concerned about increase perseveration/excessive talking about worry, anxiety, depression and want to rule out medical causes, but feel she need psychiatric evaluation.  Mild hyponatremia, do not think causing her symptoms.  Anemia to 7 -- will check hemoccult.  She is on iron pills.  Stool dark but hemoccult negative.  She has dialysis tomorrow and can be seen by dialysis MD.    She is voluntary for psychiatric evaluation, will  consult tele-psychiatrist today.  I spoke with the psychiatrist, patient does not meet criteria for inpatient admission, and he recommended as needed Ativan as a short-term temporary measure to help symptoms until she gets in as soon as possible within the next week with her primary care doctor and/or her psychiatrist.  DIFFERENTIAL DIAGNOSIS: Differential diagnosis includes, but is not limited to, alcohol, illicit or prescription medications, or other toxic ingestion; intracranial pathology such as stroke or intracerebral hemorrhage; fever or infectious causes including sepsis; hypoxemia and/or hypercarbia; uremia; trauma; endocrine related disorders such as diabetes, hypoglycemia, and thyroid-related diseases; hypertensive encephalopathy; etc.   CONSULTATIONS:  TTS.  Telepsychiatrist with SOC.   Patient / Family / Caregiver informed of clinical course, medical decision-making process, and agree with plan.   I discussed return precautions, follow-up instructions, and discharge instructions with patient and/or family.  Discharge Instructions :  You are evaluated for anxiety and depression in association with recently starting on dialysis.  Your exam and evaluation are overall reassuring in the emergency you are being started on antianxiety medication to use as needed as a temporary short-term solution until you can get in with your psychiatrist and/or primary care doctor to consider further long-term options to control symptoms.  Return to emergencydepartment immediately for any worsening or uncontrolled depression or thoughts of wanting to hurt herself or others, altered mental status, weakness, numbness, confusion, fevers, or any other symptoms concerning to you.    ADDENDED: I was called to the patient's bedside after completing discharge instructions.  Patient had apparently started to have some jerking and twitching and tremors.  When she stood up to go to the bathroom these were  extinguished.  I was able to witness them when she was sitting on the bedside commode and she is having some twitching in her arms and her face and her legs.  They do stop at times especially when she seems to focus.  States that she has had this 1 other time when she was on lithium, but she is not on lithium now.  I am going to give her a dose of Ativan right now.  Movements do not look like seizures, and I am most suspicious that they are associated with her significant anxiety.  However, I really do  not think that she is safe really from a fall standpoint given the twitching that she is having right now and this certainly makes the amount of anxiety seem more concerning from the standpoint of sending her home at this point.  I am going to go ahead and recommend keeping her overnight and treating the symptoms, and she can see the psychiatry consult tomorrow.  Again I do not think this is an intracranial problem.  If she were to continue to have problems, could consider neurology evaluation, but to my impression right now this seems like twitching and tremors that are probably related to her anxious state.  ___________________________________________   FINAL CLINICAL IMPRESSION(S) / ED DIAGNOSES   Final diagnoses:  Anemia, unspecified type  Anxiety      ___________________________________________  ED Discharge Orders        Ordered    LORazepam (ATIVAN) 0.5 MG tablet  Every 8 hours PRN     08/15/17 1755            Note: This dictation was prepared with Dragon dictation. Any transcriptional errors that result from this process are unintentional    Lisa Roca, MD 08/15/17 1759    Lisa Roca, MD 08/15/17 [phone]

## 2017-08-15 NOTE — Discharge Instructions (Signed)
You are evaluated for anxiety and depression in association with recently starting on dialysis.  Your exam and evaluation are overall reassuring in the emergency you are being started on antianxiety medication to use as needed as a temporary short-term solution until you can get in with your psychiatrist and/or primary care doctor to consider further long-term options to control symptoms.  Return to emergencydepartment immediately for any worsening or uncontrolled depression or thoughts of wanting to hurt herself or others, altered mental status, weakness, numbness, confusion, fevers, or any other symptoms concerning to you.

## 2017-08-15 NOTE — ED Triage Notes (Signed)
Confusion x 2 weeks per family. Patient states yes when questioned if has pain but cannot state where.

## 2017-08-15 NOTE — ED Notes (Signed)

## 2017-08-15 NOTE — ED Notes (Signed)
Telepsych is taken into pts room at this time. SOC is ready to speak to patient. Family at bedside.

## 2017-08-15 NOTE — ED Notes (Signed)
Pt is eating [place] tray at this time. Family at bedside. Call light within reach will continue to monitor for changes.

## 2017-08-15 NOTE — ED Notes (Addendum)
Per pt's sister, pt has been acting more confused than her normal. Pt has a hx of paranoid schizophrenia with confusion. Pt's sister is stating that pt is talking more than she usually does. Pt stating pain in her knees at this time. Pt is able to ambulate with assist. Pt's sister is stating that pt recently started dialysis last week and that is when the confusion seemed to start. Sister is concerned that her sodium is low. Pt placed on cardiac monitor. PIV site intact. Pt is tearful and chattering to self. Pt does not answer questions appropriately. Pt was unable to void in triage and nurse explained to sister to call nurse if pt needs to use the restroom.

## 2017-08-15 NOTE — ED Triage Notes (Signed)
Family states patient has had intermittent confusion for years related to schizophrenia. States began dialysis one week ago. States patient lives with mom.

## 2017-08-16 ENCOUNTER — Emergency Department: Payer: Medicare Other

## 2017-08-16 ENCOUNTER — Encounter: Payer: Self-pay | Admitting: Internal Medicine

## 2017-08-16 ENCOUNTER — Other Ambulatory Visit: Payer: Self-pay

## 2017-08-16 DIAGNOSIS — N2581 Secondary hyperparathyroidism of renal origin: Secondary | ICD-10-CM | POA: Diagnosis not present

## 2017-08-16 DIAGNOSIS — Z992 Dependence on renal dialysis: Secondary | ICD-10-CM | POA: Diagnosis not present

## 2017-08-16 DIAGNOSIS — Z841 Family history of disorders of kidney and ureter: Secondary | ICD-10-CM | POA: Diagnosis not present

## 2017-08-16 DIAGNOSIS — D631 Anemia in chronic kidney disease: Secondary | ICD-10-CM | POA: Diagnosis not present

## 2017-08-16 DIAGNOSIS — Z23 Encounter for immunization: Secondary | ICD-10-CM | POA: Diagnosis not present

## 2017-08-16 DIAGNOSIS — F319 Bipolar disorder, unspecified: Secondary | ICD-10-CM | POA: Diagnosis not present

## 2017-08-16 DIAGNOSIS — Z79899 Other long term (current) drug therapy: Secondary | ICD-10-CM | POA: Diagnosis not present

## 2017-08-16 DIAGNOSIS — N186 End stage renal disease: Secondary | ICD-10-CM | POA: Diagnosis not present

## 2017-08-16 DIAGNOSIS — Z22322 Carrier or suspected carrier of Methicillin resistant Staphylococcus aureus: Secondary | ICD-10-CM | POA: Diagnosis not present

## 2017-08-16 DIAGNOSIS — I120 Hypertensive chronic kidney disease with stage 5 chronic kidney disease or end stage renal disease: Secondary | ICD-10-CM | POA: Diagnosis not present

## 2017-08-16 DIAGNOSIS — G629 Polyneuropathy, unspecified: Secondary | ICD-10-CM | POA: Diagnosis not present

## 2017-08-16 DIAGNOSIS — G9340 Encephalopathy, unspecified: Secondary | ICD-10-CM | POA: Diagnosis not present

## 2017-08-16 DIAGNOSIS — I959 Hypotension, unspecified: Secondary | ICD-10-CM | POA: Diagnosis not present

## 2017-08-16 DIAGNOSIS — R4182 Altered mental status, unspecified: Secondary | ICD-10-CM | POA: Diagnosis not present

## 2017-08-16 DIAGNOSIS — G9349 Other encephalopathy: Secondary | ICD-10-CM | POA: Diagnosis not present

## 2017-08-16 DIAGNOSIS — E871 Hypo-osmolality and hyponatremia: Secondary | ICD-10-CM | POA: Diagnosis not present

## 2017-08-16 DIAGNOSIS — F419 Anxiety disorder, unspecified: Secondary | ICD-10-CM | POA: Diagnosis not present

## 2017-08-16 DIAGNOSIS — Z9049 Acquired absence of other specified parts of digestive tract: Secondary | ICD-10-CM | POA: Diagnosis not present

## 2017-08-16 DIAGNOSIS — F209 Schizophrenia, unspecified: Secondary | ICD-10-CM | POA: Diagnosis not present

## 2017-08-16 DIAGNOSIS — D649 Anemia, unspecified: Secondary | ICD-10-CM | POA: Diagnosis not present

## 2017-08-16 LAB — BASIC METABOLIC PANEL
ANION GAP: 17 — AB (ref 5–15)
BUN: 71 mg/dL — AB (ref 6–20)
CHLORIDE: 89 mmol/L — AB (ref 101–111)
CO2: 27 mmol/L (ref 22–32)
Calcium: 9 mg/dL (ref 8.9–10.3)
Creatinine, Ser: 9.71 mg/dL — ABNORMAL HIGH (ref 0.44–1.00)
GFR calc non Af Amer: 4 mL/min — ABNORMAL LOW (ref >60)
GFR, EST AFRICAN AMERICAN: 4 mL/min — AB (ref >60)
Glucose, Bld: 85 mg/dL (ref 65–99)
POTASSIUM: 4 mmol/L (ref 3.5–5.1)
SODIUM: 133 mmol/L — AB (ref 135–145)

## 2017-08-16 LAB — MRSA PCR SCREENING: MRSA by PCR: POSITIVE — AB

## 2017-08-16 LAB — IRON AND TIBC
IRON: 38 ug/dL (ref 28–170)
Saturation Ratios: 14 % (ref 10.4–31.8)
TIBC: 267 ug/dL (ref 250–450)
UIBC: 229 ug/dL

## 2017-08-16 LAB — TSH: TSH: 1.305 u[IU]/mL (ref 0.350–4.500)

## 2017-08-16 LAB — PHOSPHORUS: Phosphorus: 2.2 mg/dL — ABNORMAL LOW (ref 2.5–4.6)

## 2017-08-16 LAB — FOLATE: FOLATE: 89.3 ng/mL (ref >5.9)

## 2017-08-16 LAB — VITAMIN B12: VITAMIN B 12: 1999 pg/mL — AB (ref 180–914)

## 2017-08-16 LAB — FERRITIN: FERRITIN: 300 ng/mL (ref 11–307)

## 2017-08-16 MED ORDER — SODIUM CHLORIDE 0.9 % IV SOLN: 100.0000 mL | INTRAVENOUS | Status: DC | PRN

## 2017-08-16 MED ORDER — ONDANSETRON HCL 4 MG/2ML IJ SOLN: 4.0000 mg | Freq: Four times a day (QID) | INTRAMUSCULAR | Status: DC | PRN

## 2017-08-16 MED ORDER — GABAPENTIN 100 MG PO CAPS
100.0000 mg | ORAL_CAPSULE | Freq: Two times a day (BID) | ORAL | Status: DC
  Filled 2017-08-16 (×2): qty 1

## 2017-08-16 MED ORDER — LIDOCAINE-PRILOCAINE 2.5-2.5 % EX CREA
1.0000 "application " | TOPICAL_CREAM | CUTANEOUS | Status: DC | PRN
  Filled 2017-08-16: qty 5

## 2017-08-16 MED ORDER — MIRTAZAPINE 15 MG PO TABS
15.0000 mg | ORAL_TABLET | Freq: Every day | ORAL | Status: DC
  Administered 2017-08-16 – 2017-08-17 (×2): 15 mg via ORAL
  Filled 2017-08-16 (×2): qty 1

## 2017-08-16 MED ORDER — HEPARIN SODIUM (PORCINE) 1000 UNIT/ML DIALYSIS
1000.0000 [IU] | INTRAMUSCULAR | Status: DC | PRN
  Filled 2017-08-16: qty 1

## 2017-08-16 MED ORDER — ACETAMINOPHEN 650 MG RE SUPP: 650.0000 mg | Freq: Four times a day (QID) | RECTAL | Status: DC | PRN

## 2017-08-16 MED ORDER — ALTEPLASE 2 MG IJ SOLR: 2.0000 mg | Freq: Once | INTRAMUSCULAR | Status: DC | PRN

## 2017-08-16 MED ORDER — ONDANSETRON HCL 4 MG PO TABS: 4.0000 mg | ORAL_TABLET | Freq: Four times a day (QID) | ORAL | Status: DC | PRN

## 2017-08-16 MED ORDER — VITAMIN B-1 100 MG PO TABS
50.0000 mg | ORAL_TABLET | Freq: Every day | ORAL | Status: DC
  Administered 2017-08-17 – 2017-08-18 (×2): 50 mg via ORAL
  Filled 2017-08-16 (×3): qty 1

## 2017-08-16 MED ORDER — ADULT MULTIVITAMIN W/MINERALS CH
1.0000 | ORAL_TABLET | Freq: Every day | ORAL | Status: DC
  Administered 2017-08-17 – 2017-08-18 (×2): 1 via ORAL
  Filled 2017-08-16 (×3): qty 1

## 2017-08-16 MED ORDER — SODIUM BICARBONATE 650 MG PO TABS
650.0000 mg | ORAL_TABLET | Freq: Every day | ORAL | Status: DC
  Filled 2017-08-16: qty 1

## 2017-08-16 MED ORDER — LIDOCAINE HCL (PF) 1 % IJ SOLN
5.0000 mL | INTRAMUSCULAR | Status: DC | PRN
  Filled 2017-08-16: qty 5

## 2017-08-16 MED ORDER — POLYSACCHARIDE IRON COMPLEX 150 MG PO CAPS
150.0000 mg | ORAL_CAPSULE | Freq: Every day | ORAL | Status: DC
  Administered 2017-08-17 – 2017-08-18 (×2): 150 mg via ORAL
  Filled 2017-08-16 (×2): qty 1

## 2017-08-16 MED ORDER — OLANZAPINE 10 MG PO TABS
20.0000 mg | ORAL_TABLET | Freq: Every day | ORAL | Status: DC
  Administered 2017-08-16 – 2017-08-17 (×2): 20 mg via ORAL
  Filled 2017-08-16 (×3): qty 2

## 2017-08-16 MED ORDER — PNEUMOCOCCAL VAC POLYVALENT 25 MCG/0.5ML IJ INJ
0.5000 mL | INJECTION | INTRAMUSCULAR | Status: AC
  Administered 2017-08-18: 0.5 mL via INTRAMUSCULAR
  Filled 2017-08-16: qty 0.5

## 2017-08-16 MED ORDER — ACETAMINOPHEN 325 MG PO TABS: 650.0000 mg | ORAL_TABLET | Freq: Four times a day (QID) | ORAL | Status: DC | PRN

## 2017-08-16 MED ORDER — PENTAFLUOROPROP-TETRAFLUOROETH EX AERO
1.0000 "application " | INHALATION_SPRAY | CUTANEOUS | Status: DC | PRN
  Filled 2017-08-16: qty 30

## 2017-08-16 NOTE — Progress Notes (Signed)
Pre HD assessment  

## 2017-08-16 NOTE — ED Notes (Signed)

## 2017-08-16 NOTE — ED Notes (Signed)
Patient transported to CT 

## 2017-08-16 NOTE — ED Provider Notes (Signed)
Vitals:   08/15/17 2231 08/16/17 0652  BP: 104/61 102/65  Pulse: 76 77  Resp: 14 18  Temp: 97.7 F (36.5 C) 97.8 F (36.6 C)  SpO2: 100% 100%      Psych recommends medical evaluation and further work-up.  Ct Head Wo Contrast  Result Date: 08/16/2017 CLINICAL DATA:  History of schizophrenia and depression and renal disease. Worsening mental status. EXAM: CT HEAD WITHOUT CONTRAST TECHNIQUE: Contiguous axial images were obtained from the base of the skull through the vertex without intravenous contrast. COMPARISON:  04/18/2017 FINDINGS: Brain: The ventricles and cisterns are within normal. CSF spaces are unremarkable. There is no mass effect, shift of midline structures or acute hemorrhage. No evidence of acute infarction. There is a calcified high frontal right parafalcine mass measuring 2 cm unchanged likely a meningioma. Vascular: No hyperdense vessel or unexpected calcification. Skull: Normal. Negative for fracture or focal lesion. Sinuses/Orbits: No acute finding. Other: None. IMPRESSION: No acute intracranial findings. Stable high right frontal calcified extra-axial mass likely a meningioma. Electronically Signed   By: Marin Olp M.D.   On: 08/16/2017 11:39    CT reviewed by me, no acute changes  Will admit for further workup of altered mental status. Discussed with Dr. Earleen Newport, also Dr. Holley Raring notes nephrology happy to assist with inpatient dialysis needs and consult.  Admitted  Altered mental status, etiology unclear at this time   Delman Kitten, MD 08/16/17 1341

## 2017-08-16 NOTE — Progress Notes (Signed)
HD tx start 

## 2017-08-16 NOTE — ED Notes (Signed)
Patient assisted to the restroom and given the tv remote.

## 2017-08-16 NOTE — ED Notes (Signed)
Patient has visitor at bedside, Elizebeth Koller.

## 2017-08-16 NOTE — ED Notes (Signed)
SOC called for reconsult  [phone]

## 2017-08-16 NOTE — H&P (Signed)
[place] [facility] [place] NAME: [name]    MR#:  [id number]  DATE OF BIRTH:  [birth year]  DATE OF ADMISSION:  08/15/2017  PRIMARY CARE PHYSICIAN: Lucianne Lei, MD   REQUESTING/REFERRING PHYSICIAN: Dr Delman Kitten  CHIEF COMPLAINT:   Chief Complaint  Patient presents with  . Altered Mental Status    HISTORY OF PRESENT ILLNESS:  Madison [name]  is a 66 y.o. female with a known history of chronic kidney disease that recently progressed to end-stage renal disease.  She started dialysis on [facility].  She was brought in with altered mental status.  The patient's is continuously talking and hard to focus.  She does answer some questions appropriately.  She is able to move all extremities.  Does not complain of anything.  I spoke with the patient's sister on the phone.  She states that the patient seems to be overwhelmed with the dialysis and her mental status has gotten worse since the dialysis is started.  Of note, the patient did lose a twin sister a few years ago because her twin sister did not want to do dialysis.  In the ER, she was evaluated by telemetry psychiatry and they recommended more medical workup.  Since she is due for dialysis the ER physician asked for a medical admission.  We are unable to do dialysis on a patient that is in the ER.  PAST MEDICAL HISTORY:   Past Medical History:  Diagnosis Date  . Anxiety   . ESRD (end stage renal disease) on dialysis ([place])   . Neuropathy   . Schizophrenia ([place])     PAST SURGICAL HISTORY:   Past Surgical History:  Procedure Laterality Date  . CHOLECYSTECTOMY    . COLONOSCOPY N/A 01/20/2016   Procedure: COLONOSCOPY;  Surgeon: [name], MD;  Location: [facility];  Service: Endoscopy;  Laterality: N/A;  1:15 PM-moved to 130 Office to notify  . fistula left arm    . HEMICOLECTOMY Right   . TUBAL LIGATION      SOCIAL HISTORY:   Social History   Tobacco Use  . Smoking  status: Never Smoker  . Smokeless tobacco: Never Used  Substance Use Topics  . Alcohol use: No    FAMILY HISTORY:   Family History  Problem Relation Age of Onset  . Alzheimer's disease Mother   . Cancer Father   . Kidney failure Sister     DRUG ALLERGIES:  No Known Allergies  REVIEW OF SYSTEMS:  CONSTITUTIONAL: No fever, fatigue or weakness.  Cold feeling. EYES: No blurred or double vision.  EARS, NOSE, AND THROAT: No tinnitus or ear pain. No sore throat RESPIRATORY: No cough, shortness of breath, wheezing or hemoptysis.  CARDIOVASCULAR: No chest pain, orthopnea, edema.  GASTROINTESTINAL: No nausea, vomiting, diarrhea or abdominal pain. No blood in bowel movements.  Some constipation GENITOURINARY: No dysuria, hematuria.  ENDOCRINE: No polyuria, nocturia,  HEMATOLOGY: No anemia, easy bruising or bleeding SKIN: No rash or lesion. MUSCULOSKELETAL: No joint pain or arthritis.   NEUROLOGIC: No tingling, numbness, weakness.  PSYCHIATRY: History of schizophrenia  MEDICATIONS AT HOME:   Prior to Admission medications   Medication Sig Start Date End Date Taking? Authorizing Provider  gabapentin (NEURONTIN) 100 MG capsule Take 100 mg by mouth 2 (two) times daily. Pt takes one tablet in the AM and 2 tablets at bedtime   Yes [provider]  metoprolol tartrate (LOPRESSOR) 25 MG tablet Take 1 tablet (25  mg total) by mouth 2 (two) times daily. Patient taking differently: Take 12.5 mg by mouth 2 (two) times daily.  04/18/16  Yes Kathie Dike, MD  mirtazapine (REMERON) 15 MG tablet Take 15 mg by mouth at bedtime.   Yes [provider]  Multiple Vitamin (MULTIVITAMIN) tablet Take 1 tablet by mouth daily.   Yes [provider]  OLANZapine (ZYPREXA) 20 MG tablet Take 20 mg by mouth at bedtime.   Yes [provider]  PROCRIT 78676 UNIT/ML injection Inject 1 Syringe into the muscle every 14 (fourteen) days. 08/13/17  Yes [provider]  sodium  bicarbonate 650 MG tablet Take 650 mg by mouth daily.   Yes [provider]  thiamine (VITAMIN B-1) 50 MG tablet Take 50 mg by mouth daily.   Yes [provider]  furosemide (LASIX) 20 MG tablet Take 1 tablet (20 mg total) by mouth daily. Restart in 3 days Patient taking differently: Take 20 mg by mouth 2 (two) times a week. Monday and Friday 04/18/16   Kathie Dike, MD  LORazepam (ATIVAN) 0.5 MG tablet Take 1 tablet (0.5 mg total) by mouth every 8 (eight) hours as needed for anxiety. 08/15/17 08/15/18  Lisa Roca, MD      VITAL SIGNS:  Blood pressure 102/65, pulse 77, temperature 97.8 F (36.6 C), temperature source Oral, resp. rate 18, height 5\' 4"  (1.626 m), weight 54.9 kg (121 lb 1 oz), SpO2 100 %.  PHYSICAL EXAMINATION:  GENERAL:  66 y.o.-year-old patient lying in the bed with no acute distress.  EYES: Pupils equal, round, reactive to light and accommodation. No scleral icterus. Extraocular muscles intact.  HEENT: Head atraumatic, normocephalic. Oropharynx and nasopharynx clear.  NECK:  Supple, no jugular venous distention. No thyroid enlargement, no tenderness.  LUNGS: Normal breath sounds bilaterally, no wheezing, rales,rhonchi or crepitation. No use of accessory muscles of respiration.  CARDIOVASCULAR: S1, S2 normal. No murmurs, rubs, or gallops.  ABDOMEN: Soft, nontender, nondistended. Bowel sounds present. No organomegaly or mass.  EXTREMITIES: No pedal edema, cyanosis, or clubbing.  Bruising around left arm fistula site.  Still able to feel a thrill over there. NEUROLOGIC: Cranial nerves II through XII are intact. Muscle strength 5/5 in all extremities. Sensation intact. Gait not checked.  PSYCHIATRIC: The patient is alert and answers some questions appropriately.  Hard to focus on answering some other questions.Marland Kitchen  SKIN: No rash, lesion, or ulcer.  Bruising left upper arm  LABORATORY PANEL:   CBC Recent Labs  Lab 08/15/17 1155  WBC 7.1  HGB 7.3*  HCT  21.6*  PLT 186   ------------------------------------------------------------------------------------------------------------------  Chemistries  Recent Labs  Lab 08/15/17 1155 08/16/17 1214  NA 130* 133*  K 4.2 4.0  CL 86* 89*  CO2 27 27  GLUCOSE 104* 85  BUN 59* 71*  CREATININE 8.52* 9.71*  CALCIUM 9.2 9.0  AST 32  --   ALT 14  --   ALKPHOS 87  --   BILITOT 0.9  --    ------------------------------------------------------------------------------------------------------------------  Cardiac Enzymes Recent Labs  Lab 08/15/17 1155  TROPONINI <0.03   ------------------------------------------------------------------------------------------------------------------  RADIOLOGY:  Ct Head Wo Contrast  Result Date: 08/16/2017 CLINICAL DATA:  History of schizophrenia and depression and renal disease. Worsening mental status. EXAM: CT HEAD WITHOUT CONTRAST TECHNIQUE: Contiguous axial images were obtained from the base of the skull through the vertex without intravenous contrast. COMPARISON:  04/18/2017 FINDINGS: Brain: The ventricles and cisterns are within normal. CSF spaces are unremarkable. There is no mass  effect, shift of midline structures or acute hemorrhage. No evidence of acute infarction. There is a calcified high frontal right parafalcine mass measuring 2 cm unchanged likely a meningioma. Vascular: No hyperdense vessel or unexpected calcification. Skull: Normal. Negative for fracture or focal lesion. Sinuses/Orbits: No acute finding. Other: None. IMPRESSION: No acute intracranial findings. Stable high right frontal calcified extra-axial mass likely a meningioma. Electronically Signed   By: Marin Olp M.D.   On: 08/16/2017 11:39    EKG:   Normal sinus rhythm 95 bpm, left atrial enlargement, left ventricular hypertrophy.  Q waves septally.  IMPRESSION AND PLAN:   1.  Acute encephalopathy.  In my opinion, this seems more psychiatric to me with the patient with pressured  speech.  She seems to answer some questions appropriately.  No signs of fever, no white blood cell count.  CT scan of the head negative.  In speaking with the sister, she believes that the patient has been overwhelmed with the starting of dialysis and she believes her psychiatric issues have worsened.  We will get psychiatric consultation.  Stop gabapentin. 2.  End-stage renal disease on dialysis.  Case discussed with nephrology to set up with dialysis while here in the hospital.  Unfortunately unable to do dialysis in the ER and needs to be inpatient.  Stop sodium bicarb. 3.  Anemia.  Send off a ferritin.  Sister states that the patient takes iron.  Also looks like takes Procrit injections every couple weeks.  We will also guaiac stools.  The drop in hemoglobin could be with the bruising on the left arm. 4.  Relative hypotension.  Hold metoprolol.  May end up needing midodrine with dialysis. 5.  History of neuropathy hold gabapentin at this time 6.  Relative hyponatremia.  This can be corrected with dialysis.  All the records are reviewed and case discussed with ED provider. Management plans discussed with the patient, family and they are in agreement.  CODE STATUS: Full code as per sister  TOTAL TIME TAKING CARE OF THIS PATIENT: 55 minutes, and coordination of care and speaking with the sister, ER physician and nephrology.   Loletha Grayer M.D on 08/16/2017 at 2:27 PM  Between 7am to 6pm - Pager - [phone]  After 6pm call admission pager [phone]  [facility] Office  [phone]  CC: Primary care physician; Lucianne Lei, MD

## 2017-08-16 NOTE — ED Notes (Signed)
Pharmacy to review medications after SOC.

## 2017-08-16 NOTE — B[facility] Counselor (Addendum)
Counselor spoke with ED nurse and recommended follow up [facility] to be completed after noon to receive final recommendation for pt. Counselor removed case mgmt service from header due to error.  Ethelene Browns, [place], LPCA 08-16-17

## 2017-08-16 NOTE — Progress Notes (Signed)
HD tx end  

## 2017-08-16 NOTE — ED Notes (Signed)
SOC set up at bedside. 

## 2017-08-16 NOTE — ED Notes (Addendum)
Contacted sister [name] [phone] , was able to obtain information concerning schedule of outpatient dialysis, normally m/w/f, this week has been different due to holiday schedule this week she has had Sunday/Tuesday and then to have a treatment today (Friday) at [facility] [phone])

## 2017-08-16 NOTE — Progress Notes (Signed)
Post HD assessment  

## 2017-08-17 DIAGNOSIS — E871 Hypo-osmolality and hyponatremia: Secondary | ICD-10-CM | POA: Diagnosis not present

## 2017-08-17 DIAGNOSIS — G9340 Encephalopathy, unspecified: Secondary | ICD-10-CM | POA: Diagnosis not present

## 2017-08-17 DIAGNOSIS — R410 Disorientation, unspecified: Secondary | ICD-10-CM | POA: Diagnosis not present

## 2017-08-17 DIAGNOSIS — R4182 Altered mental status, unspecified: Secondary | ICD-10-CM | POA: Diagnosis not present

## 2017-08-17 DIAGNOSIS — D649 Anemia, unspecified: Secondary | ICD-10-CM | POA: Diagnosis not present

## 2017-08-17 DIAGNOSIS — I120 Hypertensive chronic kidney disease with stage 5 chronic kidney disease or end stage renal disease: Secondary | ICD-10-CM | POA: Diagnosis not present

## 2017-08-17 DIAGNOSIS — D631 Anemia in chronic kidney disease: Secondary | ICD-10-CM | POA: Diagnosis not present

## 2017-08-17 DIAGNOSIS — G9349 Other encephalopathy: Secondary | ICD-10-CM | POA: Diagnosis not present

## 2017-08-17 DIAGNOSIS — F209 Schizophrenia, unspecified: Secondary | ICD-10-CM | POA: Diagnosis not present

## 2017-08-17 DIAGNOSIS — N2581 Secondary hyperparathyroidism of renal origin: Secondary | ICD-10-CM | POA: Diagnosis not present

## 2017-08-17 DIAGNOSIS — F419 Anxiety disorder, unspecified: Secondary | ICD-10-CM | POA: Diagnosis not present

## 2017-08-17 DIAGNOSIS — Z23 Encounter for immunization: Secondary | ICD-10-CM | POA: Diagnosis not present

## 2017-08-17 DIAGNOSIS — N186 End stage renal disease: Secondary | ICD-10-CM | POA: Diagnosis not present

## 2017-08-17 LAB — CBC
HCT: 24.3 % — ABNORMAL LOW (ref 35.0–47.0)
HEMOGLOBIN: 8.3 g/dL — AB (ref 12.0–16.0)
MCH: 30.8 pg (ref 26.0–34.0)
MCHC: 34 g/dL (ref 32.0–36.0)
MCV: 90.5 fL (ref 80.0–100.0)
Platelets: 170 10*3/uL (ref 150–440)
RBC: 2.69 MIL/uL — AB (ref 3.80–5.20)
RDW: 14.5 % (ref 11.5–14.5)
WBC: 5.3 10*3/uL (ref 3.6–11.0)

## 2017-08-17 LAB — BASIC METABOLIC PANEL
ANION GAP: 14 (ref 5–15)
BUN: 30 mg/dL — ABNORMAL HIGH (ref 6–20)
CALCIUM: 8.6 mg/dL — AB (ref 8.9–10.3)
CHLORIDE: 101 mmol/L (ref 101–111)
CO2: 27 mmol/L (ref 22–32)
Creatinine, Ser: 5.43 mg/dL — ABNORMAL HIGH (ref 0.44–1.00)
GFR calc non Af Amer: 7 mL/min — ABNORMAL LOW (ref >60)
GFR, EST AFRICAN AMERICAN: 9 mL/min — AB (ref >60)
Glucose, Bld: 84 mg/dL (ref 65–99)
Potassium: 4.6 mmol/L (ref 3.5–5.1)
SODIUM: 142 mmol/L (ref 135–145)

## 2017-08-17 LAB — HEPATITIS B SURFACE ANTIBODY,QUALITATIVE: Hep B S Ab: NONREACTIVE

## 2017-08-17 LAB — HEPATITIS B CORE ANTIBODY, TOTAL: HEP B C TOTAL AB: POSITIVE — AB

## 2017-08-17 LAB — PARATHYROID HORMONE, INTACT (NO CA): PTH: 508 pg/mL — ABNORMAL HIGH (ref 15–65)

## 2017-08-17 MED ORDER — EPOETIN ALFA 10000 UNIT/ML IJ SOLN: 10000.0000 [IU] | INTRAMUSCULAR | Status: DC

## 2017-08-17 NOTE — Progress Notes (Signed)
[facility]  ROUNDING NOTE   Subjective:  Patient well-known to [place] from 2 years ago. She was originally brought to the emergency department for altered mental status. She does have underlying history of schizophrenia. She did not meet qualifications for inpatient psychiatric admission however. Therefore patient brought in under medicine. Patient did complete hemodialysis yesterday. She states that she normally dialyzes in [place].   Objective:  Vital signs in last 24 hours:  Temp:  [97.5 F (36.4 C)-98.6 F (37 C)] 97.5 F (36.4 C) (11/24 0450) Pulse Rate:  [74-97] 95 (11/24 0450) Resp:  [16-24] 19 (11/24 0450) BP: (94-123)/(46-63) 109/46 (11/24 0450) SpO2:  [100 %] 100 % (11/24 0450)  Weight change:  Filed Weights   08/15/17 1151  Weight: 54.9 kg (121 lb 1 oz)    Intake/Output: I/O last 3 completed shifts: In: 360 [P.O.:360] Out: 639 [Urine:100; Other:539]   Intake/Output this shift:  No intake/output data recorded.  Physical Exam: General: Slender female, no acute distress  Head: Normocephalic, atraumatic. Moist oral mucosal membranes  Eyes: Anicteric  Neck: Supple, trachea midline  Lungs:  Clear to auscultation, normal effort  Heart: S1S2 2/6 SEM  Abdomen:  Soft, nontender, bowel sounds present  Extremities: no peripheral edema.  Neurologic: Awake, alert, following commands, confused  Skin: No lesions  Access: LUE AVF    Basic Metabolic Panel: Recent Labs  Lab 08/15/17 1155 08/16/17 1214 08/16/17 1723 08/17/17 0515  NA 130* 133*  --  142  K 4.2 4.0  --  4.6  CL 86* 89*  --  101  CO2 27 27  --  27  GLUCOSE 104* 85  --  84  BUN 59* 71*  --  30*  CREATININE 8.52* 9.71*  --  5.43*  CALCIUM 9.2 9.0  --  8.6*  PHOS  --   --  2.2*  --     Liver Function Tests: Recent Labs  Lab 08/15/17 1155  AST 32  ALT 14  ALKPHOS 87  BILITOT 0.9  PROT 7.2  ALBUMIN 4.0   No results for input(s): LIPASE, AMYLASE in the last 168 hours. No  results for input(s): AMMONIA in the last 168 hours.  CBC: Recent Labs  Lab 08/15/17 1155 08/17/17 0515  WBC 7.1 5.3  HGB 7.3* 8.3*  HCT 21.6* 24.3*  MCV 90.4 90.5  PLT 186 170    Cardiac Enzymes: Recent Labs  Lab 08/15/17 1155  TROPONINI <0.03    BNP: Invalid input(s): POCBNP  CBG: No results for input(s): GLUCAP in the last 168 hours.  Microbiology: Results for orders placed or performed during the hospital encounter of 08/15/17  MRSA PCR Screening     Status: Abnormal   Collection Time: 08/16/17  8:28 PM  Result Value Ref Range Status   MRSA by PCR POSITIVE (A) NEGATIVE Final    Comment:        The GeneXpert MRSA Assay (FDA approved for NASAL specimens only), is one component of a comprehensive MRSA colonization surveillance program. It is not intended to diagnose MRSA infection nor to guide or monitor treatment for MRSA infections. RESULT CALLED TO, READ BACK BY AND VERIFIED WITH: CRYSTAL BASS AT 2146 08/16/17.PMH     Coagulation Studies: No results for input(s): LABPROT, INR in the last 72 hours.  Urinalysis: Recent Labs    08/15/17 1155  COLORURINE YELLOW*  LABSPEC 1.007  PHURINE 8.0  GLUCOSEU NEGATIVE  HGBUR NEGATIVE  BILIRUBINUR NEGATIVE  KETONESUR NEGATIVE  PROTEINUR >=300*  NITRITE  NEGATIVE  LEUKOCYTESUR SMALL*      Imaging: Ct Head Wo Contrast  Result Date: 08/16/2017 CLINICAL DATA:  History of schizophrenia and depression and renal disease. Worsening mental status. EXAM: CT HEAD WITHOUT CONTRAST TECHNIQUE: Contiguous axial images were obtained from the base of the skull through the vertex without intravenous contrast. COMPARISON:  04/18/2017 FINDINGS: Brain: The ventricles and cisterns are within normal. CSF spaces are unremarkable. There is no mass effect, shift of midline structures or acute hemorrhage. No evidence of acute infarction. There is a calcified high frontal right parafalcine mass measuring 2 cm unchanged likely a  meningioma. Vascular: No hyperdense vessel or unexpected calcification. Skull: Normal. Negative for fracture or focal lesion. Sinuses/Orbits: No acute finding. Other: None. IMPRESSION: No acute intracranial findings. Stable high right frontal calcified extra-axial mass likely a meningioma. Electronically Signed   By: Marin Olp M.D.   On: 08/16/2017 11:39     Medications:   . sodium chloride    . sodium chloride     . iron polysaccharides  150 mg Oral Daily  . mirtazapine  15 mg Oral QHS  . multivitamin with minerals  1 tablet Oral Daily  . OLANZapine  20 mg Oral QHS  . pneumococcal 23 valent vaccine  0.5 mL Intramuscular Tomorrow-1000  . thiamine  50 mg Oral Daily   sodium chloride, sodium chloride, acetaminophen **OR** acetaminophen, alteplase, heparin, lidocaine (PF), lidocaine-prilocaine, ondansetron **OR** ondansetron (ZOFRAN) IV, pentafluoroprop-tetrafluoroeth  Assessment/ Plan:  66 y.o. female with past medical history of schizophrenia, peripheral neuropathy, anxiety, end-stage renal disease on dialysis in [place], anemia of chronic kidney disease, and secondary hyperparathyroidism who was admitted for altered mental status.  [facility]/[place]/MWF (unconfirmed)  1.  ESRD on HD.  Patient is followed by the [facility] group of nephrologists in [place].  She did complete hemodialysis yesterday.  No acute indication for dialysis today.  We will plan for dialysis again on Monday.  2.  Anemia of chronic kidney disease.  Hemoglobin low at 8.3.  We will start the patient on Epogen 10,000 units with dialysis on Monday.  Iron saturation was found to be 14%.  3.  Secondary hyperparathyroidism.  Most recent phosphorus was 2.2.  Reevaluate on Monday.  4.  Altered mental status.  Patient is currently on mirtazapine and olanzapine.  Recommend psychiatry consultation.  4.   LOS: 1 Nikky Duba 11/24/201811:15 AM

## 2017-08-17 NOTE — Progress Notes (Signed)
[place] [facility] [place] NAME: Madison [name]    MR#:  [id number]  DATE OF BIRTH:  1951-02-21  SUBJECTIVE:  Patient was brought in for altered mental status.  She is awake she is alert oriented x2.  Appears calm and answers most questions appropriately.  REVIEW OF SYSTEMS:   Review of Systems  Constitutional: Negative for chills, fever and weight loss.  HENT: Negative for ear discharge, ear pain and nosebleeds.   Eyes: Negative for blurred vision, pain and discharge.  Respiratory: Negative for sputum production, shortness of breath, wheezing and stridor.   Cardiovascular: Negative for chest pain, palpitations, orthopnea and PND.  Gastrointestinal: Negative for abdominal pain, diarrhea, nausea and vomiting.  Genitourinary: Negative for frequency and urgency.  Musculoskeletal: Negative for back pain and joint pain.  Neurological: Positive for weakness. Negative for sensory change, speech change and focal weakness.  Psychiatric/Behavioral: Negative for depression and hallucinations. The patient is not nervous/anxious.    Tolerating Diet: yeS Tolerating PT: no PT follow-up  DRUG ALLERGIES:  No Known Allergies  VITALS:  Blood pressure (!) 109/46, pulse 95, temperature (!) 97.5 F (36.4 C), temperature source Oral, resp. rate 19, height 5\' 4"  (1.626 m), weight 54.9 kg (121 lb 1 oz), SpO2 100 %.  PHYSICAL EXAMINATION:   Physical Exam  GENERAL:  66 y.o.-year-old patient lying in the bed with no acute distress.  EYES: Pupils equal, round, reactive to light and accommodation. No scleral icterus. Extraocular muscles intact.  HEENT: Head atraumatic, normocephalic. Oropharynx and nasopharynx clear.  NECK:  Supple, no jugular venous distention. No thyroid enlargement, no tenderness.  LUNGS: Normal breath sounds bilaterally, no wheezing, rales, rhonchi. No use of accessory muscles of respiration.  CARDIOVASCULAR: S1, S2 normal. No murmurs, rubs,  or gallops.  ABDOMEN: Soft, nontender, nondistended. Bowel sounds present. No organomegaly or mass.  EXTREMITIES: No cyanosis, clubbing or edema b/l.    NEUROLOGIC: Cranial nerves II through XII are intact. No focal Motor or sensory deficits b/l.   PSYCHIATRIC:  patient is alert and oriented x 3.  SKIN: No obvious rash, lesion, or ulcer.   LABORATORY PANEL:  CBC Recent Labs  Lab 08/17/17 0515  WBC 5.3  HGB 8.3*  HCT 24.3*  PLT 170    Chemistries  Recent Labs  Lab 08/15/17 1155  08/17/17 0515  NA 130*   < > 142  K 4.2   < > 4.6  CL 86*   < > 101  CO2 27   < > 27  GLUCOSE 104*   < > 84  BUN 59*   < > 30*  CREATININE 8.52*   < > 5.43*  CALCIUM 9.2   < > 8.6*  AST 32  --   --   ALT 14  --   --   ALKPHOS 87  --   --   BILITOT 0.9  --   --    < > = values in this interval not displayed.   Cardiac Enzymes Recent Labs  Lab 08/15/17 1155  TROPONINI <0.03   RADIOLOGY:  Ct Head Wo Contrast  Result Date: 08/16/2017 CLINICAL DATA:  History of schizophrenia and depression and renal disease. Worsening mental status. EXAM: CT HEAD WITHOUT CONTRAST TECHNIQUE: Contiguous axial images were obtained from the base of the skull through the vertex without intravenous contrast. COMPARISON:  04/18/2017 FINDINGS: Brain: The ventricles and cisterns are within normal. CSF spaces are unremarkable. There is no mass effect, shift of  midline structures or acute hemorrhage. No evidence of acute infarction. There is a calcified high frontal right parafalcine mass measuring 2 cm unchanged likely a meningioma. Vascular: No hyperdense vessel or unexpected calcification. Skull: Normal. Negative for fracture or focal lesion. Sinuses/Orbits: No acute finding. Other: None. IMPRESSION: No acute intracranial findings. Stable high right frontal calcified extra-axial mass likely a meningioma. Electronically Signed   By: Marin Olp M.D.   On: 08/16/2017 11:39   ASSESSMENT AND PLAN:  Madison [name]  is a 66  y.o. female with a known history of chronic kidney disease that recently progressed to end-stage renal disease.  She started dialysis on [facility] [place].  She was brought in with altered mental status  1.  Acute encephalopathy.  In my opinion, this seems more psychiatric to me with the patient with pressured speech.  She seems to answer some questions appropriately.  No signs of fever, no white blood cell count.  CT scan of the head negative.  - In speaking with the sister, she believes that the patient has been overwhelmed with the starting of dialysis and she believes her psychiatric issues have worsened.  -Patient was seen by Dr.Rakesh from psychiatry.  Recommendations or any change in the dosage were given.  We will have care management looking to see if patient's sister can get some help at home.  Patient will follow up with her psychiatrist as outpatient. -Patient at present is alert and pleasant  2.  End-stage renal disease on dialysis.  C -Continue in-house dialysis   3.  Anemia.  Appears to be due to anemia of chronic disease and CKD   4.  Relative hypotension.  Hold metoprolol.  Patient asymptomatic  5.  History of neuropathy hold gabapentin at this time  Physical therapy saw patient and recommends no PT follow-up.  I spoke at length with patient's sister.  Discussed with care management to discuss with sister if patient is appropriate for any help.  Patient will need outpatient follow-up with her psychiatrist sooner than 6-month appointment. Will discharge tomorrow if she remains stable this was discussed with the sister who voiced understanding.   Case discussed with Care Management/Social Worker. Management plans discussed with the patient, family and they are in agreement.  CODE STATUS: full  DVT Prophylaxis heparin  TOTAL TIME TAKING CARE OF THIS PATIENT: *30 minutes.  >50% time spent on counselling and coordination of care  POSSIBLE D/C IN **1 DAYS, DEPENDING ON CLINICAL  CONDITION.  Note: This dictation was prepared with Dragon dictation along with smaller phrase technology. Any transcriptional errors that result from this process are unintentional.  Fritzi Mandes M.D on 08/17/2017 at 1:15 PM  Between 7am to 6pm - Pager - [phone]  After 6pm go to [email or web address] - password [email or web address]  Office  [phone]  CC: Primary care physician; Lucianne Lei, MD

## 2017-08-17 NOTE — Progress Notes (Signed)
Physical Therapy Evaluation Patient Details Name: Madison [name] MRN: [id number] DOB: 04-14-1951 Today's Date: 08/17/2017   History of Present Illness  Patient is a 66 y.o. female admitted on 51 NOV with altered mental status. PMH includes CKD that progressed to ESRD, beginning dialysis 11 NOV. Patient also has anxiety and schizophrenia.  Clinical Impression  Patient admitted for above listed reasons. On evaluation, patient oriented x2 and consistently answering questions about PLOF inconsistently. She was very soft spoken. PT unsure whether she lives in house with washer in basement or if she lives in trailer; however, she did mention that her mother lives in one of those residences. Patient denied use of AD prior to admission and demonstrates independence in all aspects of mobility and in toileting. Because patient is functionally independent, it is believed that she does not require PT f/u once she has been medically cleared for her cognitive deficits for safe d/c.    Follow Up Recommendations No PT follow up    Equipment Recommendations  None recommended by PT    Recommendations for Other Services       Precautions / Restrictions Precautions Precautions: Fall Restrictions Weight Bearing Restrictions: No      Mobility  Bed Mobility Overal bed mobility: Independent             General bed mobility comments: Patient performs bed mobility independently.  Transfers Overall transfer level: Independent Equipment used: None             General transfer comment: Patient demonstrates STS transfer with good safety awareness.  Ambulation/Gait Ambulation/Gait assistance: Independent Ambulation Distance (Feet): 60 Feet Assistive device: None       General Gait Details: Patient ambulates 38' within room with no AD/LOB. Demonstrates decreased trunk rotation and cadence.  Stairs            Wheelchair Mobility    Modified Rankin (Stroke Patients Only)        Balance Overall balance assessment: Modified Independent                                           Pertinent Vitals/Pain Pain Assessment: No/denies pain    Home Living Family/patient expects to be discharged to:: Private residence Living Arrangements: Parent Available Help at Discharge: Family;Available PRN/intermittently Type of Home: House Home Access: Level entry     Home Layout: Multi-level Home Equipment: None Additional Comments: Patient oriented x2 at time of evaluation. Consistently altered answers to questions. Unsure if patient lives in trailer or in house with washer in basement.     Prior Function Level of Independence: Independent         Comments: Difficult to assess due to altered mental status.     Hand Dominance        Extremity/Trunk Assessment   Upper Extremity Assessment Upper Extremity Assessment: Generalized weakness;Difficult to assess due to impaired cognition    Lower Extremity Assessment Lower Extremity Assessment: Generalized weakness       Communication   Communication: No difficulties  Cognition Arousal/Alertness: Awake/alert Behavior During Therapy: WFL for tasks assessed/performed;Flat affect Overall Cognitive Status: Difficult to assess                                        General Comments      Exercises  Assessment/Plan    PT Assessment Patent does not need any further PT services  PT Problem List         PT Treatment Interventions      PT Goals (Current goals can be found in the Care Plan section)  Acute Rehab PT Goals Patient Stated Goal: Unstated PT Goal Formulation: With patient Time For Goal Achievement: 08/31/17 Potential to Achieve Goals: Good    Frequency     Barriers to discharge        Co-evaluation               AM-PAC PT "6 Clicks" Daily Activity  Outcome Measure Difficulty turning over in bed (including adjusting bedclothes, sheets and  blankets)?: None Difficulty moving from lying on back to sitting on the side of the bed? : None Difficulty sitting down on and standing up from a chair with arms (e.g., wheelchair, bedside commode, etc,.)?: None Help needed moving to and from a bed to chair (including a wheelchair)?: None Help needed walking in hospital room?: None Help needed climbing 3-5 steps with a railing? : A Little 6 Click Score: 23    End of Session Equipment Utilized During Treatment: Gait belt Activity Tolerance: Patient tolerated treatment well Patient left: in bed;with call bell/phone within reach;with bed alarm set   PT Visit Diagnosis: Muscle weakness (generalized) (M62.81)    Time: 0177-9390 PT Time Calculation (min) (ACUTE ONLY): 20 min   Charges:   PT Evaluation $PT Eval Low Complexity: 1 Low     PT G Codes:   PT G-Codes **NOT FOR INPATIENT CLASS** Functional Assessment Tool Used: AM-PAC 6 Clicks Basic Mobility;Clinical judgement Functional Limitation: Mobility: Walking and moving around Mobility: Walking and Moving Around Current Status (Z0092): At least 1 percent but less than 20 percent impaired, limited or restricted Mobility: Walking and Moving Around Goal Status [phone]): At least 1 percent but less than 20 percent impaired, limited or restricted Mobility: Walking and Moving Around Discharge Status [phone]): At least 1 percent but less than 20 percent impaired, limited or restricted      Dorice Lamas, PT, DPT, COMT 08/17/2017, 10:12 AM

## 2017-08-17 NOTE — Consult Note (Signed)
[facility] Consult   Reason for Consult: Primary team concerned about confusion in patient as well as exacerbation of psychiatric symptoms. Referring Physician: Nicholes Mango MD Patient Identification: Madison [name] MRN:  [id number] Principal Diagnosis: <principal problem not specified> Diagnosis:   Patient Active Problem List   Diagnosis Date Noted  . Hypernatremia [E87.0] 04/17/2016  . CKD (chronic kidney disease) stage 3, GFR 30-59 ml/min (HCC) [N18.3] 04/16/2016  . Anemia due to other cause [D64.89] 04/16/2016  . Meningioma ([place]) [D32.9] 04/16/2016  . Acute psychosis ([place]) [F23] 04/15/2016  . Hyponatremia [E87.1] 04/15/2016  . AKI (acute kidney injury) ([place]) [N17.9] 04/15/2016  . Special screening for malignant neoplasms, colon [Z12.11]   . Thrombocytopenia ([place]) [D69.6] 01/05/2016  . Altered mental status [R41.82] 04/19/2015    Total Time spent with patient: 1 hour  Subjective:   Elzabeth [name] is a 66 y.o. female patient admitted with history of chronic kidney disease which has progressed to end-stage renal disease.  Patient was started on dialysis last Monday.  Was admitted to [facility] hospital with altered mental status which started the day dialysis began.  She was brought into the ED on the 24th with concerns that the confusion was worsening.  Excerpt from ER note dated 08/15/2017 reads as follows "The patient states that she does feel depressed without any suicidal thoughts.  She does state that she is anxious and feels nervous all the time.  Family states that she is crying a lot."  Speaking with patient today she endorses to me "I do not think I am ready to go back to my sister's house now.  She calls all the shots.  She wants me to buckle underneath her.  At times she says that she is going to knock my head.  I think she is really frustrated from looking after me.  I do not know what else could be done or where else I can go.  You see when the phone  rings my mom goes to get the phone but my mom does not know what button to press.  I know what button to press and I know how to get the phone.  My sister just sounds like my dad sometimes.  I really do not like her."  When I asked patient about whether she has any friends she says "I have a really great friend.  His name is Madison [name].  He pays all my bills.  My sister does not like me having him as a friend.  You see my twin sister passed away a year ago."  She denies any instances of physical violence or physical abuse from her sister.  Says this has never happened and she does not think it well.  Tells me that she thinks she is in [place] in this hospital is in [place].  Is able to tell me the month and year but not the day or date.   Trying to do a MOCA  cognitive assessment she scores only 5 out of 30.  She was unable to draw a clock, copy the cube, do the Trail Making Test, or do any of the memory, attention, abstraction, delayed recall tasks.  She was able to identify the lion and camel.  She was able to repeat digits in the forward order, repeat 2 sentences and language as well as tell me what the month year is.  Firmly denies any suicidal thoughts or thoughts about dying.  Denies sadness of mood, crying spells, low energy,  feeling worthless or hopeless.  Patient would like to go to the senior center and looks forward to the future.  Denies any symptoms suggestive of auditory or visual hallucinations.  Denies any anxiety symptoms currently.  Feels anxious about going home, says that she is concerned about her sister's frustration in taking care of her and her mom.  Collateral from Madison [name] patient's friend -elucidates to me that patient has been confused even prior to the day she started getting dialysis.  She called the wrong number on the day she was supposed to get dialysis and started thinking that she had already gotten dialysis when she had not.  Endorses that she has been confused and  the confusion has been decreasing but relatively stable.  He does not feel there are any safety issues with her going back to live with her sister.  He pays all her bills and has enrolled her in multiple insurance policies.  Feels that sister has been frustrated with patient and her mother living with sister.  Endorses that patient's twin sister passed away 3-4 years ago and not a year ago as patient said.  Says patient gets a little emotional around this time of the year when she thinks about her twin sister who passed away.  Feels that patient would be adequately taken care of at her sister's house.  Says that she has her own room and privacy.  Patient's mother is 40 years old and has dementia.  Past Psychiatric History: Patient has been admitted at least 3 times at [facility] her diagnosis was bipolar disorder.  Denies any past history of suicidal attempts or self-injurious behavior.  Risk to Self: Is patient at risk for suicide?: No Risk to Others:  No Prior Inpatient Therapy:  At [facility] Prior Outpatient Therapy:  Unknown  Past Medical History:  Past Medical History:  Diagnosis Date  . Anxiety   . ESRD (end stage renal disease) on dialysis ([place])   . Neuropathy   . Schizophrenia [facility])     Past Surgical History:  Procedure Laterality Date  . CHOLECYSTECTOMY    . COLONOSCOPY N/A 01/20/2016   Procedure: COLONOSCOPY;  Surgeon: [name], MD;  Location: [facility] ENDO SUITE;  Service: Endoscopy;  Laterality: N/A;  1:15 PM-moved to 130 Office to notify  . fistula left arm    . HEMICOLECTOMY Right   . TUBAL LIGATION     Family History:  Family History  Problem Relation Age of Onset  . Alzheimer's disease Mother   . Cancer Father   . Kidney failure Sister    Family Psychiatric  History: History of dementia and patient's mom. Social History:  Social History   Substance and Sexual Activity  Alcohol Use No     Social History   Substance and Sexual  Activity  Drug Use No    Social History   Socioeconomic History  . Marital status: Single    Spouse name: None  . Number of children: None  . Years of education: None  . Highest education level: None  Social Needs  . Financial resource strain: None  . Food insecurity - worry: None  . Food insecurity - inability: None  . Transportation needs - medical: None  . Transportation needs - non-medical: None  Occupational History  . None  Tobacco Use  . Smoking status: Never Smoker  . Smokeless tobacco: Never Used  Substance and Sexual Activity  . Alcohol use: No  . Drug use:  No  . Sexual activity: None  Other Topics Concern  . None  Social History Narrative  . None   Additional Social History:    Allergies:  No Known Allergies  Labs:  Results for orders placed or performed during the hospital encounter of 08/15/17 (from the past 48 hour(s))  Basic metabolic panel     Status: Abnormal   Collection Time: 08/16/17 12:14 PM  Result Value Ref Range   Sodium 133 (L) 135 - 145 mmol/L   Potassium 4.0 3.5 - 5.1 mmol/L    Comment: HEMOLYSIS AT THIS LEVEL MAY AFFECT RESULT   Chloride 89 (L) 101 - 111 mmol/L   CO2 27 22 - 32 mmol/L   Glucose, Bld 85 65 - 99 mg/dL   BUN 71 (H) 6 - 20 mg/dL   Creatinine, Ser 9.71 (H) 0.44 - 1.00 mg/dL   Calcium 9.0 8.9 - 10.3 mg/dL   GFR calc non Af Amer 4 (L) >60 mL/min   GFR calc Af Amer 4 (L) >60 mL/min    Comment: (NOTE) The eGFR has been calculated using the CKD EPI equation. This calculation has not been validated in all clinical situations. eGFR's persistently <60 mL/min signify possible Chronic Kidney Disease.    Anion gap 17 (H) 5 - 15  TSH     Status: None   Collection Time: 08/16/17 12:14 PM  Result Value Ref Range   TSH 1.305 0.350 - 4.500 uIU/mL    Comment: Performed by a 3rd Generation assay with a functional sensitivity of <=0.01 uIU/mL.  Vitamin B12     Status: Abnormal   Collection Time: 08/16/17 12:14 PM  Result Value Ref  Range   Vitamin B-12 1,999 (H) 180 - 914 pg/mL    Comment: (NOTE) This assay is not validated for testing neonatal or myeloproliferative syndrome specimens for Vitamin B12 levels. Performed at [facility], [place] [address] [place], [place] [address]   Folate     Status: None   Collection Time: 08/16/17 12:14 PM  Result Value Ref Range   Folate 89.3 >5.9 ng/mL    Comment: HEMOLYSIS AT THIS LEVEL MAY AFFECT RESULT RESULT CONFIRMED BY MANUAL DILUTION.  TFK   Ferritin     Status: None   Collection Time: 08/16/17 12:14 PM  Result Value Ref Range   Ferritin 300 11 - 307 ng/mL  Iron and TIBC     Status: None   Collection Time: 08/16/17 12:14 PM  Result Value Ref Range   Iron 38 28 - 170 ug/dL    Comment: HEMOLYSIS AT THIS LEVEL MAY AFFECT RESULT   TIBC 267 250 - 450 ug/dL   Saturation Ratios 14 10.4 - 31.8 %   UIBC 229 ug/dL  Phosphorus     Status: Abnormal   Collection Time: 08/16/17  5:23 PM  Result Value Ref Range   Phosphorus 2.2 (L) 2.5 - 4.6 mg/dL  Parathyroid hormone, intact (no Ca)     Status: Abnormal   Collection Time: 08/16/17  5:23 PM  Result Value Ref Range   PTH 508 (H) 15 - 65 pg/mL    Comment: (NOTE) Performed At: [facility] [place], [place] [id number] Rush Farmer MD [id number]   Hepatitis B surface antibody     Status: None   Collection Time: 08/16/17  5:23 PM  Result Value Ref Range   Hep B S Ab Non Reactive     Comment: (NOTE)  Non Reactive: Inconsistent with immunity,                            less than 10 mIU/mL              Reactive:     Consistent with immunity,                            greater than 9.9 mIU/mL **Verified by repeat analysis** Performed At: [facility] [place], [place] [id number] Rush Farmer MD [id number]   Hepatitis B core antibody, total     Status: Abnormal   Collection Time: 08/16/17  5:23 PM  Result Value Ref Range   Hep B Core Total Ab  Positive (A) Negative    Comment: (NOTE) Performed At: [facility] [place], [place] [id number] Rush Farmer MD [id number]   MRSA PCR Screening     Status: Abnormal   Collection Time: 08/16/17  8:28 PM  Result Value Ref Range   MRSA by PCR POSITIVE (A) NEGATIVE    Comment:        The GeneXpert MRSA Assay (FDA approved for NASAL specimens only), is one component of a comprehensive MRSA colonization surveillance program. It is not intended to diagnose MRSA infection nor to guide or monitor treatment for MRSA infections. RESULT CALLED TO, READ BACK BY AND VERIFIED WITH: CRYSTAL BASS AT 2146 08/16/17.PMH   Basic metabolic panel     Status: Abnormal   Collection Time: 08/17/17  5:15 AM  Result Value Ref Range   Sodium 142 135 - 145 mmol/L   Potassium 4.6 3.5 - 5.1 mmol/L    Comment: HEMOLYSIS AT THIS LEVEL MAY AFFECT RESULT   Chloride 101 101 - 111 mmol/L   CO2 27 22 - 32 mmol/L   Glucose, Bld 84 65 - 99 mg/dL   BUN 30 (H) 6 - 20 mg/dL   Creatinine, Ser 5.43 (H) 0.44 - 1.00 mg/dL   Calcium 8.6 (L) 8.9 - 10.3 mg/dL   GFR calc non Af Amer 7 (L) >60 mL/min   GFR calc Af Amer 9 (L) >60 mL/min    Comment: (NOTE) The eGFR has been calculated using the CKD EPI equation. This calculation has not been validated in all clinical situations. eGFR's persistently <60 mL/min signify possible Chronic Kidney Disease.    Anion gap 14 5 - 15  CBC     Status: Abnormal   Collection Time: 08/17/17  5:15 AM  Result Value Ref Range   WBC 5.3 3.6 - 11.0 K/uL   RBC 2.69 (L) 3.80 - 5.20 MIL/uL   Hemoglobin 8.3 (L) 12.0 - 16.0 g/dL   HCT 24.3 (L) 35.0 - 47.0 %   MCV 90.5 80.0 - 100.0 fL   MCH 30.8 26.0 - 34.0 pg   MCHC 34.0 32.0 - 36.0 g/dL   RDW 14.5 11.5 - 14.5 %   Platelets 170 150 - 440 K/uL    Comment: PLATELET CLUMPS NOTED ON SMEAR, COUNT APPEARS ADEQUATE    Current Facility-Administered Medications  Medication Dose Route Frequency Provider Last Rate Last  Dose  . 0.9 %  sodium chloride infusion  100 mL Intravenous PRN Lateef, Munsoor, MD      . 0.9 %  sodium chloride infusion  100 mL Intravenous PRN Lateef, Munsoor, MD      . acetaminophen (TYLENOL) tablet 650 mg  650  mg Oral Q6H PRN Loletha Grayer, MD       Or  . acetaminophen (TYLENOL) suppository 650 mg  650 mg Rectal Q6H PRN Wieting, Richard, MD      . alteplase (CATHFLO ACTIVASE) injection 2 mg  2 mg Intracatheter Once PRN Holley Raring, Munsoor, MD      . Derrill Memo ON 08/19/2017] epoetin alfa (EPOGEN,PROCRIT) injection 10,000 Units  10,000 Units Intravenous Q M,W,F-HD Lateef, Munsoor, MD      . heparin injection 1,000 Units  1,000 Units Dialysis PRN Lateef, Munsoor, MD      . iron polysaccharides (NIFEREX) capsule 150 mg  150 mg Oral Daily Loletha Grayer, MD   150 mg at 08/17/17 1254  . lidocaine (PF) (XYLOCAINE) 1 % injection 5 mL  5 mL Intradermal PRN Lateef, Munsoor, MD      . lidocaine-prilocaine (EMLA) cream 1 application  1 application Topical PRN Lateef, Munsoor, MD      . mirtazapine (REMERON) tablet 15 mg  15 mg Oral QHS Delman Kitten, MD   15 mg at 08/16/17 2250  . multivitamin with minerals tablet 1 tablet  1 tablet Oral Daily Delman Kitten, MD   1 tablet at 08/17/17 1035  . OLANZapine (ZYPREXA) tablet 20 mg  20 mg Oral QHS Delman Kitten, MD   20 mg at 08/16/17 2250  . ondansetron (ZOFRAN) tablet 4 mg  4 mg Oral Q6H PRN Loletha Grayer, MD       Or  . ondansetron (ZOFRAN) injection 4 mg  4 mg Intravenous Q6H PRN Wieting, Richard, MD      . pentafluoroprop-tetrafluoroeth (GEBAUERS) aerosol 1 application  1 application Topical PRN Lateef, Munsoor, MD      . pneumococcal 23 valent vaccine (PNU-IMMUNE) injection 0.5 mL  0.5 mL Intramuscular Tomorrow-1000 Wieting, Richard, MD      . thiamine (VITAMIN B-1) tablet 50 mg  50 mg Oral Daily Delman Kitten, MD   50 mg at 08/17/17 1035  Serum creatinine 5.43.  CT scan from 2017 showed right sided calcified meningioma which has remained unchanged when the  CT scan was done yesterday.  On mirtazapine 15 mg at bedtime, Zyprexa 20 mg at bedtime, lorazepam 0.5 mg as needed, gabapentin 100 mg in a.m. and 200 mg in p.m.  Musculoskeletal: Strength & Muscle Tone: Not tested Gait & Station: Not tested Patient leans: N/A  Psychiatric Specialty Exam: Physical Exam  ROS  Blood pressure (!) 109/46, pulse 95, temperature (!) 97.5 F (36.4 C), temperature source Oral, resp. rate 19, height '5\' 4"'$  (1.626 m), weight 121 lb 1 oz (54.9 kg), SpO2 100 %.Body mass index is 20.78 kg/m.  General Appearance: Fairly Groomed  Eye Contact:  Good  Speech:  Normal Rate  Volume:  Normal  Mood:  Euthymic  Affect:  Full Range  Thought Process:  Coherent  Orientation:  Other:  Oriented to month and year but not date or place  Thought Content:  Logical  Suicidal Thoughts:  No  Homicidal Thoughts:  No  Memory:  Impaired when MOCA was done  Judgement:  Impaired  Insight:  Lacking  AIMS (if indicated):     Assets:  Engineer, maintenance  ADL's:  Intact  Cognition:  Impaired,  Severe  Sleep:      Patient is a 66 year old female with a past history of 2-3 instances of inpatient hospitalization at [facility], a previous diagnosis of bipolar disorder and psychosis (not essentially clear what she was admitted for as an inpatient, most recent  inpatient admission being many years ago).  Admitted to [facility] hospital with worsening end-stage renal disease and concomitant altered mental status.  Psychiatry consulted by primary care team after persistence of possible confusion and patient being scanned to go home.  Interview with the patient reveals multiple cognitive deficits as shown by a MOCA score of 5 on 30.  This is suggestive of severe cognitive impairment.  Subacute delirium could be a differential, however patient was able to have a logical and coherent conversation for the most part with me.  Was disoriented to place initially but  later corrected herself on this.  With a family history of dementia, worsening cognitive impairment a possible principal differential would be major neurocognitive disorder.    Patient needs to have thyroid panel, B12, folate, RPR, HIV done to rule out reversible causes of dementia.  None of these mandate inpatient psychiatric admission.  At this point patient does not have any behavioral disturbances associated with the cognitive impairment.  Patient denies any symptoms suggestive of MDD or psychosis worsening.  It would be prudent to continue patient on her psychotropic medications for now and follow-up for the dementia on an outpatient basis.   Collateral from friend does not indicate any safety concerns that mandate patient to be not sent to live with her sister.  Attempted to call patient's sister and left a message.  Discussed this in detail with patient's primary care team too. It would also be prudent to address patient's sisters mental health as she would have to take care of two patients with dementia and this can easily lead to caregiver burden and burnout.  At present patient does not meet criteria for inpatient psychiatric admission but needs to be followed up on an outpatient basis with the psychiatrist and neurologist to monitor and evaluate her cognitive impairment.  Treatment Plan Summary: Plan Plan to be detailed in assessment and summary of treatment  Disposition: Patient does not meet criteria for psychiatric inpatient admission.  Ramond Dial, MD 08/17/2017 1:46 PM

## 2017-08-18 DIAGNOSIS — F209 Schizophrenia, unspecified: Secondary | ICD-10-CM | POA: Diagnosis not present

## 2017-08-18 DIAGNOSIS — N2581 Secondary hyperparathyroidism of renal origin: Secondary | ICD-10-CM | POA: Diagnosis not present

## 2017-08-18 DIAGNOSIS — R4182 Altered mental status, unspecified: Secondary | ICD-10-CM | POA: Diagnosis not present

## 2017-08-18 DIAGNOSIS — N186 End stage renal disease: Secondary | ICD-10-CM | POA: Diagnosis not present

## 2017-08-18 DIAGNOSIS — D631 Anemia in chronic kidney disease: Secondary | ICD-10-CM | POA: Diagnosis not present

## 2017-08-18 DIAGNOSIS — G9340 Encephalopathy, unspecified: Secondary | ICD-10-CM | POA: Diagnosis not present

## 2017-08-18 DIAGNOSIS — Z23 Encounter for immunization: Secondary | ICD-10-CM | POA: Diagnosis not present

## 2017-08-18 DIAGNOSIS — D649 Anemia, unspecified: Secondary | ICD-10-CM | POA: Diagnosis not present

## 2017-08-18 MED ORDER — POLYSACCHARIDE IRON COMPLEX 150 MG PO CAPS: 150.0000 mg | ORAL_CAPSULE | Freq: Every day | ORAL | 0 refills | Status: DC

## 2017-08-18 NOTE — Progress Notes (Signed)
[facility]  ROUNDING NOTE   Subjective:  Patient in good spirits. She is due for hemodialysis again tomorrow. Resting comfortably in bed.   Objective:  Vital signs in last 24 hours:  Temp:  [98.4 F (36.9 C)-99.2 F (37.3 C)] 98.6 F (37 C) (11/25 0504) Pulse Rate:  [103-118] 103 (11/25 0504) Resp:  [16-18] 16 (11/25 0504) BP: (128-146)/(45-58) 128/50 (11/25 0504) SpO2:  [95 %-100 %] 95 % (11/25 0504)  Weight change:  Filed Weights   08/15/17 1151  Weight: 54.9 kg (121 lb 1 oz)    Intake/Output: I/O last 3 completed shifts: In: 480 [P.O.:480] Out: 1289 [Urine:750; Other:539]   Intake/Output this shift:  No intake/output data recorded.  Physical Exam: General: Slender female, no acute distress  Head: Normocephalic, atraumatic. Moist oral mucosal membranes  Eyes: Anicteric  Neck: Supple, trachea midline  Lungs:  Clear to auscultation, normal effort  Heart: S1S2 2/6 SEM  Abdomen:  Soft, nontender, bowel sounds present  Extremities: no peripheral edema.  Neurologic: Awake, alert, oriented to self/place/time today  Skin: No lesions  Access: LUE AVF    Basic Metabolic Panel: Recent Labs  Lab 08/15/17 1155 08/16/17 1214 08/16/17 1723 08/17/17 0515  NA 130* 133*  --  142  K 4.2 4.0  --  4.6  CL 86* 89*  --  101  CO2 27 27  --  27  GLUCOSE 104* 85  --  84  BUN 59* 71*  --  30*  CREATININE 8.52* 9.71*  --  5.43*  CALCIUM 9.2 9.0  --  8.6*  PHOS  --   --  2.2*  --     Liver Function Tests: Recent Labs  Lab 08/15/17 1155  AST 32  ALT 14  ALKPHOS 87  BILITOT 0.9  PROT 7.2  ALBUMIN 4.0   No results for input(s): LIPASE, AMYLASE in the last 168 hours. No results for input(s): AMMONIA in the last 168 hours.  CBC: Recent Labs  Lab 08/15/17 1155 08/17/17 0515  WBC 7.1 5.3  HGB 7.3* 8.3*  HCT 21.6* 24.3*  MCV 90.4 90.5  PLT 186 170    Cardiac Enzymes: Recent Labs  Lab 08/15/17 1155  TROPONINI <0.03    BNP: Invalid  input(s): POCBNP  CBG: No results for input(s): GLUCAP in the last 168 hours.  Microbiology: Results for orders placed or performed during the hospital encounter of 08/15/17  MRSA PCR Screening     Status: Abnormal   Collection Time: 08/16/17  8:28 PM  Result Value Ref Range Status   MRSA by PCR POSITIVE (A) NEGATIVE Final    Comment:        The GeneXpert MRSA Assay (FDA approved for NASAL specimens only), is one component of a comprehensive MRSA colonization surveillance program. It is not intended to diagnose MRSA infection nor to guide or monitor treatment for MRSA infections. RESULT CALLED TO, READ BACK BY AND VERIFIED WITH: CRYSTAL BASS AT 2146 08/16/17.PMH     Coagulation Studies: No results for input(s): LABPROT, INR in the last 72 hours.  Urinalysis: Recent Labs    08/15/17 1155  COLORURINE YELLOW*  LABSPEC 1.007  PHURINE 8.0  GLUCOSEU NEGATIVE  HGBUR NEGATIVE  BILIRUBINUR NEGATIVE  KETONESUR NEGATIVE  PROTEINUR >=300*  NITRITE NEGATIVE  LEUKOCYTESUR SMALL*      Imaging: No results found.   Medications:   . sodium chloride    . sodium chloride     . [START ON 08/19/2017] epoetin (EPOGEN/PROCRIT) injection  10,000 Units Intravenous Q M,W,F-HD  . iron polysaccharides  150 mg Oral Daily  . mirtazapine  15 mg Oral QHS  . multivitamin with minerals  1 tablet Oral Daily  . OLANZapine  20 mg Oral QHS  . thiamine  50 mg Oral Daily   sodium chloride, sodium chloride, acetaminophen **OR** acetaminophen, alteplase, heparin, lidocaine (PF), lidocaine-prilocaine, ondansetron **OR** ondansetron (ZOFRAN) IV, pentafluoroprop-tetrafluoroeth  Assessment/ Plan:  66 y.o. female with past medical history of schizophrenia, peripheral neuropathy, anxiety, end-stage renal disease on dialysis in [place], anemia of chronic kidney disease, and secondary hyperparathyroidism who was admitted for altered mental status.  [facility]/[place]/MWF  (unconfirmed)  1.  ESRD on HD.  Patient is followed by the [facility] group of nephrologists in [place].   -Patient will be due for hemodialysis again tomorrow.  We will prepare orders.  2.  Anemia of chronic kidney disease.  Patient to receive Epogen with dialysis tomorrow.  Hemoglobin currently 8.3.  3.  Secondary hyperparathyroidism.  Recheck serum phosphorus with dialysis tomorrow.  4.  Altered mental status.  Patient is currently on mirtazapine and olanzapine.  Psychiatry consult obtained and appreciated.  Patient does not meet criteria for inpatient psychiatric hospitalization.    LOS: 2 Madison [name] 11/25/201811:33 AM

## 2017-08-18 NOTE — Progress Notes (Signed)
Discussed discharge information with patient and will also discuss with caregiver who picks her up to go home. Patient verbalized understanding of discharge teaching, but also asked if it could be gone over with her sister. Sister Madison [name] contacted via phone and notified of patients discharge. Patients sister stated " someone will be there to pick her up".

## 2017-08-18 NOTE — Progress Notes (Signed)
Sister of patient called and requested to speak with Dr. Posey Pronto for update about patients care and discharge. Dr. Posey Pronto paged and notified upon return phone call of family members request. Dr. Posey Pronto stated " ok, I will call her".

## 2017-08-18 NOTE — Discharge Summary (Signed)
[place] [facility] [place] NAME: Sean Macwilliams    MR#:  [id number]  DATE OF BIRTH:  1951-08-19  DATE OF ADMISSION:  08/15/2017 ADMITTING PHYSICIAN: Loletha Grayer, MD  DATE OF DISCHARGE: 08/18/2017  PRIMARY CARE PHYSICIAN: Lucianne Lei, MD    ADMISSION DIAGNOSIS:  Anxiety [F41.9] Anemia, unspecified type [D64.9]  DISCHARGE DIAGNOSIS:  Acute encephalopathy--resolved Anxiety  H/o schizophrenia  SECONDARY DIAGNOSIS:   Past Medical History:  Diagnosis Date  . Anxiety   . ESRD (end stage renal disease) on dialysis ([place])   . Neuropathy   . Schizophrenia [facility])     HOSPITAL COURSE:  DorothyGwynnis a40 y.o.femalewith a known history of chronic kidney disease that recently progressed to end-stage renal disease. She started dialysis on [facility]. She was brought in with altered mental status  1. Acute encephalopathy. In my opinion, this seems more psychiatric to me with the patient with pressured speech. She seems to answer some questions appropriately. No signs of fever, no white blood cell count. CT scan of the head negative.  -In speaking with the sister, she believes that the patient has been overwhelmed with the starting of dialysis and she believes her psychiatric issues have worsened.  -Patient was seen by Dr.Rakesh from psychiatry.  Recommendations or any change in the dosage were given.  We will have care management looking to see if patient's sister can get some help at home.  Patient will follow up with her psychiatrist as outpatient. -Patient at present is alert and pleasant  2. End-stage renal disease on dialysis. C -Continue in-house dialysis   3. Anemia.  Appears to be due to anemia of chronic disease and CKD   4. Relative hypotension. Hold metoprolol. Patient asymptomatic  5. History of neuropathy hold gabapentin at this time  Physical therapy saw patient and recommends no PT follow-up.   I spoke at length with patient's sister yday.  Discussed with care management to discuss with sister if patient is appropriate for any help.  Patient will need outpatient follow-up with her psychiatrist sooner than 48-month appointment.  overall stable. D/c home CONSULTS OBTAINED:  Treatment Team:  Dagoberto Ligas, MD Anthonette Legato, MD Ramond Dial, MD  DRUG ALLERGIES:  No Known Allergies  DISCHARGE MEDICATIONS:   Current Discharge Medication List    START taking these medications   Details  iron polysaccharides (NIFEREX) 150 MG capsule Take 1 capsule (150 mg total) by mouth daily. Qty: 30 capsule, Refills: 0      CONTINUE these medications which have NOT CHANGED   Details  gabapentin (NEURONTIN) 100 MG capsule Take 100 mg by mouth 2 (two) times daily. Pt takes one tablet in the AM and 2 tablets at bedtime    metoprolol tartrate (LOPRESSOR) 25 MG tablet Take 1 tablet (25 mg total) by mouth 2 (two) times daily. Qty: 60 tablet, Refills: 0    mirtazapine (REMERON) 15 MG tablet Take 15 mg by mouth at bedtime.    Multiple Vitamin (MULTIVITAMIN) tablet Take 1 tablet by mouth daily.    OLANZapine (ZYPREXA) 20 MG tablet Take 20 mg by mouth at bedtime.    PROCRIT 37902 UNIT/ML injection Inject 1 Syringe into the muscle every 14 (fourteen) days.    thiamine (VITAMIN B-1) 50 MG tablet Take 50 mg by mouth daily.    furosemide (LASIX) 20 MG tablet Take 1 tablet (20 mg total) by mouth daily. Restart in 3 days Qty: 30 tablet      STOP  taking these medications     sodium bicarbonate 650 MG tablet         If you experience worsening of your admission symptoms, develop shortness of breath, life threatening emergency, suicidal or homicidal thoughts you must seek medical attention immediately by calling 911 or calling your MD immediately  if symptoms less severe.  You Must read complete instructions/literature along with all the possible adverse reactions/side effects for all  the Medicines you take and that have been prescribed to you. Take any new Medicines after you have completely understood and accept all the possible adverse reactions/side effects.   Please note  You were cared for by a hospitalist during your hospital stay. If you have any questions about your discharge medications or the care you received while you were in the hospital after you are discharged, you can call the unit and asked to speak with the hospitalist on call if the hospitalist that took care of you is not available. Once you are discharged, your primary care physician will handle any further medical issues. Please note that NO REFILLS for any discharge medications will be authorized once you are discharged, as it is imperative that you return to your primary care physician (or establish a relationship with a primary care physician if you do not have one) for your aftercare needs so that they can reassess your need for medications and monitor your lab values. Today   SUBJECTIVE   Doing well  VITAL SIGNS:  Blood pressure (!) 128/50, pulse (!) 103, temperature 98.6 F (37 C), temperature source Oral, resp. rate 16, height 5\' 4"  (1.626 m), weight 54.9 kg (121 lb 1 oz), SpO2 95 %.  I/O:    Intake/Output Summary (Last 24 hours) at 08/18/2017 0723 Last data filed at 08/18/2017 0458 Gross per 24 hour  Intake 120 ml  Output 650 ml  Net -530 ml    PHYSICAL EXAMINATION:  GENERAL:  66 y.o.-year-old patient lying in the bed with no acute distress.  EYES: Pupils equal, round, reactive to light and accommodation. No scleral icterus. Extraocular muscles intact.  HEENT: Head atraumatic, normocephalic. Oropharynx and nasopharynx clear.  NECK:  Supple, no jugular venous distention. No thyroid enlargement, no tenderness.  LUNGS: Normal breath sounds bilaterally, no wheezing, rales,rhonchi or crepitation. No use of accessory muscles of respiration.  CARDIOVASCULAR: S1, S2 normal. No murmurs, rubs,  or gallops.  ABDOMEN: Soft, non-tender, non-distended. Bowel sounds present. No organomegaly or mass.  EXTREMITIES: No pedal edema, cyanosis, or clubbing.  NEUROLOGIC: Cranial nerves II through XII are intact. Muscle strength 5/5 in all extremities. Sensation intact. Gait not checked.  PSYCHIATRIC: The patient is alert and awake.  SKIN: No obvious rash, lesion, or ulcer.   DATA REVIEW:   CBC  Recent Labs  Lab 08/17/17 0515  WBC 5.3  HGB 8.3*  HCT 24.3*  PLT 170    Chemistries  Recent Labs  Lab 08/15/17 1155  08/17/17 0515  NA 130*   < > 142  K 4.2   < > 4.6  CL 86*   < > 101  CO2 27   < > 27  GLUCOSE 104*   < > 84  BUN 59*   < > 30*  CREATININE 8.52*   < > 5.43*  CALCIUM 9.2   < > 8.6*  AST 32  --   --   ALT 14  --   --   ALKPHOS 87  --   --   BILITOT 0.9  --   --    < > =  values in this interval not displayed.    Microbiology Results   Recent Results (from the past 240 hour(s))  MRSA PCR Screening     Status: Abnormal   Collection Time: 08/16/17  8:28 PM  Result Value Ref Range Status   MRSA by PCR POSITIVE (A) NEGATIVE Final    Comment:        The GeneXpert MRSA Assay (FDA approved for NASAL specimens only), is one component of a comprehensive MRSA colonization surveillance program. It is not intended to diagnose MRSA infection nor to guide or monitor treatment for MRSA infections. RESULT CALLED TO, READ BACK BY AND VERIFIED WITH: CRYSTAL BASS AT 2146 08/16/17.PMH     RADIOLOGY:  Ct Head Wo Contrast  Result Date: 08/16/2017 CLINICAL DATA:  History of schizophrenia and depression and renal disease. Worsening mental status. EXAM: CT HEAD WITHOUT CONTRAST TECHNIQUE: Contiguous axial images were obtained from the base of the skull through the vertex without intravenous contrast. COMPARISON:  04/18/2017 FINDINGS: Brain: The ventricles and cisterns are within normal. CSF spaces are unremarkable. There is no mass effect, shift of midline structures or acute  hemorrhage. No evidence of acute infarction. There is a calcified high frontal right parafalcine mass measuring 2 cm unchanged likely a meningioma. Vascular: No hyperdense vessel or unexpected calcification. Skull: Normal. Negative for fracture or focal lesion. Sinuses/Orbits: No acute finding. Other: None. IMPRESSION: No acute intracranial findings. Stable high right frontal calcified extra-axial mass likely a meningioma. Electronically Signed   By: Marin Olp M.D.   On: 08/16/2017 11:39     Management plans discussed with the patient, family and they are in agreement.  CODE STATUS:     Code Status Orders  (From admission, onward)        Start     Ordered   08/16/17 1425  Full code  Continuous     08/16/17 1424    Code Status History    Date Active Date Inactive Code Status Order ID Comments User Context   04/15/2016 23:32 04/18/2016 16:24 Full Code [id number]  Orvan Falconer, MD Inpatient   04/19/2015 23:38 04/21/2015 16:22 Full Code [id number]  Nicholes Mango, MD Inpatient      TOTAL TIME TAKING CARE OF THIS PATIENT: *40* minutes.    Fritzi Mandes M.D on 08/18/2017 at 7:23 AM  Between 7am to 6pm - Pager - [phone] After 6pm go to [email or web address] - password [email or web address]  Office  [phone]  CC: Primary care physician; Lucianne Lei, MD

## 2017-08-19 DIAGNOSIS — N186 End stage renal disease: Secondary | ICD-10-CM | POA: Diagnosis not present

## 2017-08-19 DIAGNOSIS — N2581 Secondary hyperparathyroidism of renal origin: Secondary | ICD-10-CM | POA: Diagnosis not present

## 2017-08-19 DIAGNOSIS — Z4931 Encounter for adequacy testing for hemodialysis: Secondary | ICD-10-CM | POA: Diagnosis not present

## 2017-08-19 DIAGNOSIS — D631 Anemia in chronic kidney disease: Secondary | ICD-10-CM | POA: Diagnosis not present

## 2017-08-19 DIAGNOSIS — N251 Nephrogenic diabetes insipidus: Secondary | ICD-10-CM | POA: Diagnosis not present

## 2017-08-19 LAB — HEPATITIS B SURFACE ANTIGEN: Hepatitis B Surface Ag: NEGATIVE

## 2017-08-21 DIAGNOSIS — N186 End stage renal disease: Secondary | ICD-10-CM | POA: Diagnosis not present

## 2017-08-21 DIAGNOSIS — N251 Nephrogenic diabetes insipidus: Secondary | ICD-10-CM | POA: Diagnosis not present

## 2017-08-21 DIAGNOSIS — D631 Anemia in chronic kidney disease: Secondary | ICD-10-CM | POA: Diagnosis not present

## 2017-08-21 DIAGNOSIS — Z4931 Encounter for adequacy testing for hemodialysis: Secondary | ICD-10-CM | POA: Diagnosis not present

## 2017-08-21 DIAGNOSIS — N2581 Secondary hyperparathyroidism of renal origin: Secondary | ICD-10-CM | POA: Diagnosis not present

## 2017-08-22 DIAGNOSIS — D649 Anemia, unspecified: Secondary | ICD-10-CM | POA: Diagnosis not present

## 2017-08-22 DIAGNOSIS — N183 Chronic kidney disease, stage 3 (moderate): Secondary | ICD-10-CM | POA: Diagnosis not present

## 2017-08-22 DIAGNOSIS — F259 Schizoaffective disorder, unspecified: Secondary | ICD-10-CM | POA: Diagnosis not present

## 2017-08-23 DIAGNOSIS — Z4931 Encounter for adequacy testing for hemodialysis: Secondary | ICD-10-CM | POA: Diagnosis not present

## 2017-08-23 DIAGNOSIS — N186 End stage renal disease: Secondary | ICD-10-CM | POA: Diagnosis not present

## 2017-08-23 DIAGNOSIS — D631 Anemia in chronic kidney disease: Secondary | ICD-10-CM | POA: Diagnosis not present

## 2017-08-23 DIAGNOSIS — N251 Nephrogenic diabetes insipidus: Secondary | ICD-10-CM | POA: Diagnosis not present

## 2017-08-23 DIAGNOSIS — N2581 Secondary hyperparathyroidism of renal origin: Secondary | ICD-10-CM | POA: Diagnosis not present

## 2017-08-23 DIAGNOSIS — Z992 Dependence on renal dialysis: Secondary | ICD-10-CM | POA: Diagnosis not present

## 2017-08-24 DIAGNOSIS — Z992 Dependence on renal dialysis: Secondary | ICD-10-CM | POA: Diagnosis not present

## 2017-08-24 DIAGNOSIS — D631 Anemia in chronic kidney disease: Secondary | ICD-10-CM | POA: Diagnosis not present

## 2017-08-24 DIAGNOSIS — N186 End stage renal disease: Secondary | ICD-10-CM | POA: Diagnosis not present

## 2017-08-24 DIAGNOSIS — F209 Schizophrenia, unspecified: Secondary | ICD-10-CM | POA: Diagnosis not present

## 2017-08-24 DIAGNOSIS — I120 Hypertensive chronic kidney disease with stage 5 chronic kidney disease or end stage renal disease: Secondary | ICD-10-CM | POA: Diagnosis not present

## 2017-08-24 DIAGNOSIS — F419 Anxiety disorder, unspecified: Secondary | ICD-10-CM | POA: Diagnosis not present

## 2017-08-26 DIAGNOSIS — R509 Fever, unspecified: Secondary | ICD-10-CM | POA: Diagnosis not present

## 2017-08-26 DIAGNOSIS — D631 Anemia in chronic kidney disease: Secondary | ICD-10-CM | POA: Diagnosis not present

## 2017-08-26 DIAGNOSIS — N186 End stage renal disease: Secondary | ICD-10-CM | POA: Diagnosis not present

## 2017-08-26 DIAGNOSIS — N185 Chronic kidney disease, stage 5: Secondary | ICD-10-CM | POA: Diagnosis not present

## 2017-08-26 DIAGNOSIS — Z79899 Other long term (current) drug therapy: Secondary | ICD-10-CM | POA: Diagnosis not present

## 2017-08-26 DIAGNOSIS — N2581 Secondary hyperparathyroidism of renal origin: Secondary | ICD-10-CM | POA: Diagnosis not present

## 2017-08-26 DIAGNOSIS — D649 Anemia, unspecified: Secondary | ICD-10-CM | POA: Diagnosis not present

## 2017-08-26 DIAGNOSIS — Z992 Dependence on renal dialysis: Secondary | ICD-10-CM | POA: Diagnosis not present

## 2017-08-27 DIAGNOSIS — Z79899 Other long term (current) drug therapy: Secondary | ICD-10-CM | POA: Diagnosis not present

## 2017-08-27 DIAGNOSIS — Z992 Dependence on renal dialysis: Secondary | ICD-10-CM | POA: Diagnosis not present

## 2017-08-27 DIAGNOSIS — N186 End stage renal disease: Secondary | ICD-10-CM | POA: Diagnosis not present

## 2017-08-27 DIAGNOSIS — D631 Anemia in chronic kidney disease: Secondary | ICD-10-CM | POA: Diagnosis not present

## 2017-08-28 DIAGNOSIS — N186 End stage renal disease: Secondary | ICD-10-CM | POA: Diagnosis not present

## 2017-08-28 DIAGNOSIS — F419 Anxiety disorder, unspecified: Secondary | ICD-10-CM | POA: Diagnosis not present

## 2017-08-28 DIAGNOSIS — I120 Hypertensive chronic kidney disease with stage 5 chronic kidney disease or end stage renal disease: Secondary | ICD-10-CM | POA: Diagnosis not present

## 2017-08-28 DIAGNOSIS — F209 Schizophrenia, unspecified: Secondary | ICD-10-CM | POA: Diagnosis not present

## 2017-08-28 DIAGNOSIS — N2581 Secondary hyperparathyroidism of renal origin: Secondary | ICD-10-CM | POA: Diagnosis not present

## 2017-08-28 DIAGNOSIS — D631 Anemia in chronic kidney disease: Secondary | ICD-10-CM | POA: Diagnosis not present

## 2017-08-28 DIAGNOSIS — Z992 Dependence on renal dialysis: Secondary | ICD-10-CM | POA: Diagnosis not present

## 2017-08-29 DIAGNOSIS — I120 Hypertensive chronic kidney disease with stage 5 chronic kidney disease or end stage renal disease: Secondary | ICD-10-CM | POA: Diagnosis not present

## 2017-08-29 DIAGNOSIS — F419 Anxiety disorder, unspecified: Secondary | ICD-10-CM | POA: Diagnosis not present

## 2017-08-29 DIAGNOSIS — D631 Anemia in chronic kidney disease: Secondary | ICD-10-CM | POA: Diagnosis not present

## 2017-08-29 DIAGNOSIS — Z992 Dependence on renal dialysis: Secondary | ICD-10-CM | POA: Diagnosis not present

## 2017-08-29 DIAGNOSIS — F209 Schizophrenia, unspecified: Secondary | ICD-10-CM | POA: Diagnosis not present

## 2017-08-29 DIAGNOSIS — N186 End stage renal disease: Secondary | ICD-10-CM | POA: Diagnosis not present

## 2017-08-30 DIAGNOSIS — D631 Anemia in chronic kidney disease: Secondary | ICD-10-CM | POA: Diagnosis not present

## 2017-08-30 DIAGNOSIS — N2581 Secondary hyperparathyroidism of renal origin: Secondary | ICD-10-CM | POA: Diagnosis not present

## 2017-08-30 DIAGNOSIS — N186 End stage renal disease: Secondary | ICD-10-CM | POA: Diagnosis not present

## 2017-09-04 DIAGNOSIS — D631 Anemia in chronic kidney disease: Secondary | ICD-10-CM | POA: Diagnosis not present

## 2017-09-04 DIAGNOSIS — N186 End stage renal disease: Secondary | ICD-10-CM | POA: Diagnosis not present

## 2017-09-04 DIAGNOSIS — N2581 Secondary hyperparathyroidism of renal origin: Secondary | ICD-10-CM | POA: Diagnosis not present

## 2017-09-05 ENCOUNTER — Ambulatory Visit: Payer: Self-pay | Admitting: Nurse Practitioner

## 2017-09-05 DIAGNOSIS — D631 Anemia in chronic kidney disease: Secondary | ICD-10-CM | POA: Diagnosis not present

## 2017-09-05 DIAGNOSIS — Z992 Dependence on renal dialysis: Secondary | ICD-10-CM | POA: Diagnosis not present

## 2017-09-05 DIAGNOSIS — F209 Schizophrenia, unspecified: Secondary | ICD-10-CM | POA: Diagnosis not present

## 2017-09-05 DIAGNOSIS — I120 Hypertensive chronic kidney disease with stage 5 chronic kidney disease or end stage renal disease: Secondary | ICD-10-CM | POA: Diagnosis not present

## 2017-09-05 DIAGNOSIS — N186 End stage renal disease: Secondary | ICD-10-CM | POA: Diagnosis not present

## 2017-09-05 DIAGNOSIS — F419 Anxiety disorder, unspecified: Secondary | ICD-10-CM | POA: Diagnosis not present

## 2017-09-06 DIAGNOSIS — N186 End stage renal disease: Secondary | ICD-10-CM | POA: Diagnosis not present

## 2017-09-06 DIAGNOSIS — N2581 Secondary hyperparathyroidism of renal origin: Secondary | ICD-10-CM | POA: Diagnosis not present

## 2017-09-06 DIAGNOSIS — D631 Anemia in chronic kidney disease: Secondary | ICD-10-CM | POA: Diagnosis not present

## 2017-09-09 DIAGNOSIS — N2581 Secondary hyperparathyroidism of renal origin: Secondary | ICD-10-CM | POA: Diagnosis not present

## 2017-09-09 DIAGNOSIS — N186 End stage renal disease: Secondary | ICD-10-CM | POA: Diagnosis not present

## 2017-09-09 DIAGNOSIS — D631 Anemia in chronic kidney disease: Secondary | ICD-10-CM | POA: Diagnosis not present

## 2017-09-10 DIAGNOSIS — F419 Anxiety disorder, unspecified: Secondary | ICD-10-CM | POA: Diagnosis not present

## 2017-09-10 DIAGNOSIS — I120 Hypertensive chronic kidney disease with stage 5 chronic kidney disease or end stage renal disease: Secondary | ICD-10-CM | POA: Diagnosis not present

## 2017-09-10 DIAGNOSIS — F209 Schizophrenia, unspecified: Secondary | ICD-10-CM | POA: Diagnosis not present

## 2017-09-10 DIAGNOSIS — D631 Anemia in chronic kidney disease: Secondary | ICD-10-CM | POA: Diagnosis not present

## 2017-09-10 DIAGNOSIS — Z992 Dependence on renal dialysis: Secondary | ICD-10-CM | POA: Diagnosis not present

## 2017-09-10 DIAGNOSIS — N186 End stage renal disease: Secondary | ICD-10-CM | POA: Diagnosis not present

## 2017-09-11 DIAGNOSIS — N186 End stage renal disease: Secondary | ICD-10-CM | POA: Diagnosis not present

## 2017-09-11 DIAGNOSIS — D631 Anemia in chronic kidney disease: Secondary | ICD-10-CM | POA: Diagnosis not present

## 2017-09-11 DIAGNOSIS — N2581 Secondary hyperparathyroidism of renal origin: Secondary | ICD-10-CM | POA: Diagnosis not present

## 2017-09-13 DIAGNOSIS — N2581 Secondary hyperparathyroidism of renal origin: Secondary | ICD-10-CM | POA: Diagnosis not present

## 2017-09-13 DIAGNOSIS — D631 Anemia in chronic kidney disease: Secondary | ICD-10-CM | POA: Diagnosis not present

## 2017-09-13 DIAGNOSIS — N186 End stage renal disease: Secondary | ICD-10-CM | POA: Diagnosis not present

## 2017-09-15 DIAGNOSIS — D631 Anemia in chronic kidney disease: Secondary | ICD-10-CM | POA: Diagnosis not present

## 2017-09-15 DIAGNOSIS — N186 End stage renal disease: Secondary | ICD-10-CM | POA: Diagnosis not present

## 2017-09-15 DIAGNOSIS — N2581 Secondary hyperparathyroidism of renal origin: Secondary | ICD-10-CM | POA: Diagnosis not present

## 2017-09-16 DIAGNOSIS — F419 Anxiety disorder, unspecified: Secondary | ICD-10-CM | POA: Diagnosis not present

## 2017-09-16 DIAGNOSIS — Z992 Dependence on renal dialysis: Secondary | ICD-10-CM | POA: Diagnosis not present

## 2017-09-16 DIAGNOSIS — D631 Anemia in chronic kidney disease: Secondary | ICD-10-CM | POA: Diagnosis not present

## 2017-09-16 DIAGNOSIS — I120 Hypertensive chronic kidney disease with stage 5 chronic kidney disease or end stage renal disease: Secondary | ICD-10-CM | POA: Diagnosis not present

## 2017-09-16 DIAGNOSIS — F209 Schizophrenia, unspecified: Secondary | ICD-10-CM | POA: Diagnosis not present

## 2017-09-16 DIAGNOSIS — N186 End stage renal disease: Secondary | ICD-10-CM | POA: Diagnosis not present

## 2017-09-18 DIAGNOSIS — N186 End stage renal disease: Secondary | ICD-10-CM | POA: Diagnosis not present

## 2017-09-18 DIAGNOSIS — D631 Anemia in chronic kidney disease: Secondary | ICD-10-CM | POA: Diagnosis not present

## 2017-09-18 DIAGNOSIS — N2581 Secondary hyperparathyroidism of renal origin: Secondary | ICD-10-CM | POA: Diagnosis not present

## 2017-09-20 DIAGNOSIS — D631 Anemia in chronic kidney disease: Secondary | ICD-10-CM | POA: Diagnosis not present

## 2017-09-20 DIAGNOSIS — N186 End stage renal disease: Secondary | ICD-10-CM | POA: Diagnosis not present

## 2017-09-20 DIAGNOSIS — N2581 Secondary hyperparathyroidism of renal origin: Secondary | ICD-10-CM | POA: Diagnosis not present

## 2017-09-22 DIAGNOSIS — D631 Anemia in chronic kidney disease: Secondary | ICD-10-CM | POA: Diagnosis not present

## 2017-09-22 DIAGNOSIS — N186 End stage renal disease: Secondary | ICD-10-CM | POA: Diagnosis not present

## 2017-09-22 DIAGNOSIS — N2581 Secondary hyperparathyroidism of renal origin: Secondary | ICD-10-CM | POA: Diagnosis not present

## 2017-09-23 DIAGNOSIS — Z992 Dependence on renal dialysis: Secondary | ICD-10-CM | POA: Diagnosis not present

## 2017-09-23 DIAGNOSIS — F209 Schizophrenia, unspecified: Secondary | ICD-10-CM | POA: Diagnosis not present

## 2017-09-23 DIAGNOSIS — I120 Hypertensive chronic kidney disease with stage 5 chronic kidney disease or end stage renal disease: Secondary | ICD-10-CM | POA: Diagnosis not present

## 2017-09-23 DIAGNOSIS — D631 Anemia in chronic kidney disease: Secondary | ICD-10-CM | POA: Diagnosis not present

## 2017-09-23 DIAGNOSIS — N186 End stage renal disease: Secondary | ICD-10-CM | POA: Diagnosis not present

## 2017-09-23 DIAGNOSIS — F419 Anxiety disorder, unspecified: Secondary | ICD-10-CM | POA: Diagnosis not present

## 2017-09-25 DIAGNOSIS — D631 Anemia in chronic kidney disease: Secondary | ICD-10-CM | POA: Diagnosis not present

## 2017-09-25 DIAGNOSIS — N186 End stage renal disease: Secondary | ICD-10-CM | POA: Diagnosis not present

## 2017-09-25 DIAGNOSIS — N2581 Secondary hyperparathyroidism of renal origin: Secondary | ICD-10-CM | POA: Diagnosis not present

## 2017-09-25 DIAGNOSIS — D509 Iron deficiency anemia, unspecified: Secondary | ICD-10-CM | POA: Diagnosis not present

## 2017-09-25 DIAGNOSIS — Z23 Encounter for immunization: Secondary | ICD-10-CM | POA: Diagnosis not present

## 2017-09-27 DIAGNOSIS — N186 End stage renal disease: Secondary | ICD-10-CM | POA: Diagnosis not present

## 2017-09-27 DIAGNOSIS — Z23 Encounter for immunization: Secondary | ICD-10-CM | POA: Diagnosis not present

## 2017-09-27 DIAGNOSIS — N2581 Secondary hyperparathyroidism of renal origin: Secondary | ICD-10-CM | POA: Diagnosis not present

## 2017-09-27 DIAGNOSIS — D631 Anemia in chronic kidney disease: Secondary | ICD-10-CM | POA: Diagnosis not present

## 2017-09-27 DIAGNOSIS — D509 Iron deficiency anemia, unspecified: Secondary | ICD-10-CM | POA: Diagnosis not present

## 2017-09-30 DIAGNOSIS — D509 Iron deficiency anemia, unspecified: Secondary | ICD-10-CM | POA: Diagnosis not present

## 2017-09-30 DIAGNOSIS — N2581 Secondary hyperparathyroidism of renal origin: Secondary | ICD-10-CM | POA: Diagnosis not present

## 2017-09-30 DIAGNOSIS — N186 End stage renal disease: Secondary | ICD-10-CM | POA: Diagnosis not present

## 2017-09-30 DIAGNOSIS — Z23 Encounter for immunization: Secondary | ICD-10-CM | POA: Diagnosis not present

## 2017-09-30 DIAGNOSIS — D631 Anemia in chronic kidney disease: Secondary | ICD-10-CM | POA: Diagnosis not present

## 2017-10-01 DIAGNOSIS — F411 Generalized anxiety disorder: Secondary | ICD-10-CM | POA: Diagnosis not present

## 2017-10-01 DIAGNOSIS — F3132 Bipolar disorder, current episode depressed, moderate: Secondary | ICD-10-CM | POA: Diagnosis not present

## 2017-10-02 DIAGNOSIS — D509 Iron deficiency anemia, unspecified: Secondary | ICD-10-CM | POA: Diagnosis not present

## 2017-10-02 DIAGNOSIS — Z23 Encounter for immunization: Secondary | ICD-10-CM | POA: Diagnosis not present

## 2017-10-02 DIAGNOSIS — D631 Anemia in chronic kidney disease: Secondary | ICD-10-CM | POA: Diagnosis not present

## 2017-10-02 DIAGNOSIS — N2581 Secondary hyperparathyroidism of renal origin: Secondary | ICD-10-CM | POA: Diagnosis not present

## 2017-10-02 DIAGNOSIS — N186 End stage renal disease: Secondary | ICD-10-CM | POA: Diagnosis not present

## 2017-10-03 DIAGNOSIS — F419 Anxiety disorder, unspecified: Secondary | ICD-10-CM | POA: Diagnosis not present

## 2017-10-04 DIAGNOSIS — Z23 Encounter for immunization: Secondary | ICD-10-CM | POA: Diagnosis not present

## 2017-10-04 DIAGNOSIS — D631 Anemia in chronic kidney disease: Secondary | ICD-10-CM | POA: Diagnosis not present

## 2017-10-04 DIAGNOSIS — D509 Iron deficiency anemia, unspecified: Secondary | ICD-10-CM | POA: Diagnosis not present

## 2017-10-04 DIAGNOSIS — N186 End stage renal disease: Secondary | ICD-10-CM | POA: Diagnosis not present

## 2017-10-04 DIAGNOSIS — N2581 Secondary hyperparathyroidism of renal origin: Secondary | ICD-10-CM | POA: Diagnosis not present

## 2017-10-07 DIAGNOSIS — Z23 Encounter for immunization: Secondary | ICD-10-CM | POA: Diagnosis not present

## 2017-10-07 DIAGNOSIS — N2581 Secondary hyperparathyroidism of renal origin: Secondary | ICD-10-CM | POA: Diagnosis not present

## 2017-10-07 DIAGNOSIS — D509 Iron deficiency anemia, unspecified: Secondary | ICD-10-CM | POA: Diagnosis not present

## 2017-10-07 DIAGNOSIS — D631 Anemia in chronic kidney disease: Secondary | ICD-10-CM | POA: Diagnosis not present

## 2017-10-07 DIAGNOSIS — N186 End stage renal disease: Secondary | ICD-10-CM | POA: Diagnosis not present

## 2017-10-09 DIAGNOSIS — N186 End stage renal disease: Secondary | ICD-10-CM | POA: Diagnosis not present

## 2017-10-09 DIAGNOSIS — Z23 Encounter for immunization: Secondary | ICD-10-CM | POA: Diagnosis not present

## 2017-10-09 DIAGNOSIS — N251 Nephrogenic diabetes insipidus: Secondary | ICD-10-CM | POA: Diagnosis not present

## 2017-10-09 DIAGNOSIS — D509 Iron deficiency anemia, unspecified: Secondary | ICD-10-CM | POA: Diagnosis not present

## 2017-10-09 DIAGNOSIS — N2581 Secondary hyperparathyroidism of renal origin: Secondary | ICD-10-CM | POA: Diagnosis not present

## 2017-10-09 DIAGNOSIS — D631 Anemia in chronic kidney disease: Secondary | ICD-10-CM | POA: Diagnosis not present

## 2017-10-11 DIAGNOSIS — Z23 Encounter for immunization: Secondary | ICD-10-CM | POA: Diagnosis not present

## 2017-10-11 DIAGNOSIS — N2581 Secondary hyperparathyroidism of renal origin: Secondary | ICD-10-CM | POA: Diagnosis not present

## 2017-10-11 DIAGNOSIS — N186 End stage renal disease: Secondary | ICD-10-CM | POA: Diagnosis not present

## 2017-10-11 DIAGNOSIS — D631 Anemia in chronic kidney disease: Secondary | ICD-10-CM | POA: Diagnosis not present

## 2017-10-11 DIAGNOSIS — D509 Iron deficiency anemia, unspecified: Secondary | ICD-10-CM | POA: Diagnosis not present

## 2017-10-14 DIAGNOSIS — N2581 Secondary hyperparathyroidism of renal origin: Secondary | ICD-10-CM | POA: Diagnosis not present

## 2017-10-14 DIAGNOSIS — D631 Anemia in chronic kidney disease: Secondary | ICD-10-CM | POA: Diagnosis not present

## 2017-10-14 DIAGNOSIS — Z23 Encounter for immunization: Secondary | ICD-10-CM | POA: Diagnosis not present

## 2017-10-14 DIAGNOSIS — N186 End stage renal disease: Secondary | ICD-10-CM | POA: Diagnosis not present

## 2017-10-14 DIAGNOSIS — D509 Iron deficiency anemia, unspecified: Secondary | ICD-10-CM | POA: Diagnosis not present

## 2017-10-16 DIAGNOSIS — D509 Iron deficiency anemia, unspecified: Secondary | ICD-10-CM | POA: Diagnosis not present

## 2017-10-16 DIAGNOSIS — N186 End stage renal disease: Secondary | ICD-10-CM | POA: Diagnosis not present

## 2017-10-16 DIAGNOSIS — D631 Anemia in chronic kidney disease: Secondary | ICD-10-CM | POA: Diagnosis not present

## 2017-10-16 DIAGNOSIS — N2581 Secondary hyperparathyroidism of renal origin: Secondary | ICD-10-CM | POA: Diagnosis not present

## 2017-10-16 DIAGNOSIS — Z23 Encounter for immunization: Secondary | ICD-10-CM | POA: Diagnosis not present

## 2017-10-17 ENCOUNTER — Encounter: Payer: Self-pay | Admitting: Gastroenterology

## 2017-10-17 ENCOUNTER — Ambulatory Visit (INDEPENDENT_AMBULATORY_CARE_PROVIDER_SITE_OTHER): Payer: Medicare Other | Admitting: Gastroenterology

## 2017-10-17 ENCOUNTER — Other Ambulatory Visit: Payer: Self-pay

## 2017-10-17 DIAGNOSIS — R634 Abnormal weight loss: Secondary | ICD-10-CM | POA: Insufficient documentation

## 2017-10-17 DIAGNOSIS — D649 Anemia, unspecified: Secondary | ICD-10-CM

## 2017-10-17 NOTE — Assessment & Plan Note (Signed)
ETIOLOGY LIKELY CHRONIC DISEASE LESS LIKELY GE JUNCTION TUMOR, GASTRIC CA.  CONTINUE IRON PILLS. OBTAIN LABS FROM DIALYSIS UNIT. COMPLETE UPPER ENDOSCOPY IN 2-3 WEEKS. DISCUSSED PROCEDURE, BENEFITS, & RISKS: < 1% chance of medication reaction, bleeding, OR perforation. WILL NEED GIVENS CAPSULE IF NO SOURCE FOR ANEMIA IDENTIFIED ON EGD. PHENERGAN 12.5 MG IV IN PREOP. FOLLOW UP IN 4 MOS.

## 2017-10-17 NOTE — Patient Instructions (Addendum)
CONTINUE IRON PILLS.  I WILL OBTAIN THE LABS THAT WERE DRAWN FROM DIALYSIS.   COMPLETE UPPER ENDOSCOPY IN 2-3 WEEKS.   FOLLOW UP IN 4 MOS.

## 2017-10-17 NOTE — Assessment & Plan Note (Signed)
ETIOLOGY LIKELY MULTIFACTORIAL UT ASSOCIATED WITH ~20 LB WEIGHT LOSS. DIFFERENTIAL DIAGNOSIS INCLUDES:  AVMs, LESS LIKELY GE JUNCTION TUMOR, GASTRIC CA.  CONTINUE IRON PILLS. OBTAIN LABS FROM DIALYSIS UNIT. COMPLETE UPPER ENDOSCOPY IN 2-3 WEEKS. DISCUSSED PROCEDURE, BENEFITS, & RISKS: < 1% chance of medication reaction, bleeding, OR perforation. WILL NEED GIVENS CAPSULE IF NO SOURCE FOR ANEMIA IDENTIFIED ON EGD. PHENERGAN 12.5 MG IV IN PREOP. FOLLOW UP IN 4 MOS.

## 2017-10-17 NOTE — Progress Notes (Signed)
ON RECALL  °

## 2017-10-17 NOTE — Progress Notes (Signed)
cc'ed to pcp °

## 2017-10-17 NOTE — Progress Notes (Signed)
   Subjective:    Patient ID: Madison [name], female    DOB: [birth year], 67 y.o.   MRN: [id number]  Lucianne Lei, MD  HPI Seeing black stool on iron. GET PRBCs. HAD 2u pRBCS ONCE SINCE STARTING DIALYSIS. PRIOR TO THAT GETTING PROCRIT SHOT. LOST WEIGHT SINCE 2017: 132 LBS AND NOW 116 LBS. STAYS COLD ALL THE TIME. OCCASIONAL CONSTIPATION USES SENOKOT. ALSO USES WATER ENEMAS IF NEEDED.  PT DENIES FEVER, CHILLS, HEMATOCHEZIA, HEMATEMESIS, nausea, vomiting, melena, diarrhea, CHEST PAIN, SHORTNESS OF BREATH,  CHANGE IN BOWEL IN HABITS, abdominal pain, problems swallowing, problems with sedation, OR heartburn or indigestion.  Past Medical History:  Diagnosis Date  . Anxiety   . ESRD (end stage renal disease) on dialysis ([place])   . Neuropathy   . Schizophrenia [facility])     Past Surgical History:  Procedure Laterality Date  . CHOLECYSTECTOMY    . COLONOSCOPY N/A 01/20/2016   Procedure: COLONOSCOPY;  Surgeon: [name], MD;  [facility];  Service: Endoscopy;  Laterality: N/A;  1:15 PM-moved to 130 Office to notify  . fistula left arm    . HEMICOLECTOMY Right   . TUBAL LIGATION      No Known Allergies  Current Outpatient Medications  Medication Sig Dispense Refill  . ferric citrate (AURYXIA) 1 GM 210 MG(Fe) tablet Take 420 mg by mouth 3 (three) times daily with meals.    . gabapentin (NEURONTIN) 100 MG capsule Take 100 mg by mouth 2 (two) times daily.     . metoprolol tartrate (LOPRESSOR) 25 MG tablet Take 1 tablet (25 mg total) by mouth 2 (two) times daily. (Patient taking differently: Take 12.5 mg by mouth every other day. )    . Multiple Vitamin (MULTIVITAMIN) tablet Take 1 tablet by mouth daily.    Marland Kitchen OLANZapine (ZYPREXA) 20 MG tablet Take 20 mg by mouth at bedtime.    . thiamine (VITAMIN B-1) 50 MG tablet Take 100 mg by mouth daily.     . furosemide (LASIX) 20 MG tablet Take 1 tablet (20 mg total) by mouth daily. Restart in 3 days (Patient not taking: Reported on 10/17/2017)     .      . mirtazapine (REMERON) 15 MG tablet Take 15 mg by mouth at bedtime.    .       Review of Systems PER HPI OTHERWISE ALL SYSTEMS ARE NEGATIVE. SLIGHTLY LIMITED DUE T COGNITIVE DEFICITS.    Objective:   Physical Exam  Constitutional: She is oriented to person, place, and time. She appears well-developed and well-nourished. No distress.  HENT:  Head: Normocephalic and atraumatic.  Mouth/Throat: Oropharynx is clear and moist. No oropharyngeal exudate.  Eyes: Pupils are equal, round, and reactive to light. No scleral icterus.  Neck: Normal range of motion. Neck supple.  Cardiovascular: Normal rate, regular rhythm and normal heart sounds.  Pulmonary/Chest: Effort normal and breath sounds normal. No respiratory distress.  Abdominal: Soft. Bowel sounds are normal. She exhibits no distension. There is no tenderness.  Musculoskeletal: She exhibits no edema.  Lymphadenopathy:    She has no cervical adenopathy.  Neurological: She is alert and oriented to person, place, and time.  NO  NEW FOCAL DEFICITS  Psychiatric:  FLAT AFFECT, NORMAL MOOD   Vitals reviewed.     Assessment & Plan:

## 2017-10-18 DIAGNOSIS — D631 Anemia in chronic kidney disease: Secondary | ICD-10-CM | POA: Diagnosis not present

## 2017-10-18 DIAGNOSIS — N2581 Secondary hyperparathyroidism of renal origin: Secondary | ICD-10-CM | POA: Diagnosis not present

## 2017-10-18 DIAGNOSIS — N186 End stage renal disease: Secondary | ICD-10-CM | POA: Diagnosis not present

## 2017-10-18 DIAGNOSIS — D509 Iron deficiency anemia, unspecified: Secondary | ICD-10-CM | POA: Diagnosis not present

## 2017-10-18 DIAGNOSIS — Z23 Encounter for immunization: Secondary | ICD-10-CM | POA: Diagnosis not present

## 2017-10-21 DIAGNOSIS — D631 Anemia in chronic kidney disease: Secondary | ICD-10-CM | POA: Diagnosis not present

## 2017-10-21 DIAGNOSIS — N186 End stage renal disease: Secondary | ICD-10-CM | POA: Diagnosis not present

## 2017-10-21 DIAGNOSIS — D509 Iron deficiency anemia, unspecified: Secondary | ICD-10-CM | POA: Diagnosis not present

## 2017-10-21 DIAGNOSIS — Z23 Encounter for immunization: Secondary | ICD-10-CM | POA: Diagnosis not present

## 2017-10-21 DIAGNOSIS — N2581 Secondary hyperparathyroidism of renal origin: Secondary | ICD-10-CM | POA: Diagnosis not present

## 2017-10-22 ENCOUNTER — Telehealth: Payer: Self-pay

## 2017-10-22 NOTE — Telephone Encounter (Signed)
Outside lab results received and placed on Dr. Nona Dell desk for review.

## 2017-10-23 DIAGNOSIS — N186 End stage renal disease: Secondary | ICD-10-CM | POA: Diagnosis not present

## 2017-10-23 DIAGNOSIS — D631 Anemia in chronic kidney disease: Secondary | ICD-10-CM | POA: Diagnosis not present

## 2017-10-23 DIAGNOSIS — N2581 Secondary hyperparathyroidism of renal origin: Secondary | ICD-10-CM | POA: Diagnosis not present

## 2017-10-23 DIAGNOSIS — D509 Iron deficiency anemia, unspecified: Secondary | ICD-10-CM | POA: Diagnosis not present

## 2017-10-23 DIAGNOSIS — Z23 Encounter for immunization: Secondary | ICD-10-CM | POA: Diagnosis not present

## 2017-10-24 DIAGNOSIS — Z992 Dependence on renal dialysis: Secondary | ICD-10-CM | POA: Diagnosis not present

## 2017-10-24 DIAGNOSIS — N186 End stage renal disease: Secondary | ICD-10-CM | POA: Diagnosis not present

## 2017-10-24 NOTE — Telephone Encounter (Signed)
Madison [name] is aware.

## 2017-10-24 NOTE — Telephone Encounter (Signed)
PLEASE CALL PT. I REVIEWED HER LABS FROM Oct 09, 2017. PT HAS A HB 9.9 AND NORMAL IRON STORES. SHE MOST LIKELY HAS ANEMIA  DUE TO KIDNEY DISEASE. COMPLETE THE EGD.

## 2017-10-25 DIAGNOSIS — N186 End stage renal disease: Secondary | ICD-10-CM | POA: Diagnosis not present

## 2017-10-25 DIAGNOSIS — D631 Anemia in chronic kidney disease: Secondary | ICD-10-CM | POA: Diagnosis not present

## 2017-10-25 DIAGNOSIS — N2581 Secondary hyperparathyroidism of renal origin: Secondary | ICD-10-CM | POA: Diagnosis not present

## 2017-10-25 DIAGNOSIS — N251 Nephrogenic diabetes insipidus: Secondary | ICD-10-CM | POA: Diagnosis not present

## 2017-10-28 DIAGNOSIS — N2581 Secondary hyperparathyroidism of renal origin: Secondary | ICD-10-CM | POA: Diagnosis not present

## 2017-10-28 DIAGNOSIS — N186 End stage renal disease: Secondary | ICD-10-CM | POA: Diagnosis not present

## 2017-10-28 DIAGNOSIS — N251 Nephrogenic diabetes insipidus: Secondary | ICD-10-CM | POA: Diagnosis not present

## 2017-10-28 DIAGNOSIS — D631 Anemia in chronic kidney disease: Secondary | ICD-10-CM | POA: Diagnosis not present

## 2017-10-29 DIAGNOSIS — F3132 Bipolar disorder, current episode depressed, moderate: Secondary | ICD-10-CM | POA: Diagnosis not present

## 2017-10-29 DIAGNOSIS — F411 Generalized anxiety disorder: Secondary | ICD-10-CM | POA: Diagnosis not present

## 2017-10-30 DIAGNOSIS — D631 Anemia in chronic kidney disease: Secondary | ICD-10-CM | POA: Diagnosis not present

## 2017-10-30 DIAGNOSIS — N2581 Secondary hyperparathyroidism of renal origin: Secondary | ICD-10-CM | POA: Diagnosis not present

## 2017-10-30 DIAGNOSIS — N251 Nephrogenic diabetes insipidus: Secondary | ICD-10-CM | POA: Diagnosis not present

## 2017-10-30 DIAGNOSIS — N186 End stage renal disease: Secondary | ICD-10-CM | POA: Diagnosis not present

## 2017-11-01 DIAGNOSIS — N186 End stage renal disease: Secondary | ICD-10-CM | POA: Diagnosis not present

## 2017-11-01 DIAGNOSIS — D631 Anemia in chronic kidney disease: Secondary | ICD-10-CM | POA: Diagnosis not present

## 2017-11-01 DIAGNOSIS — N2581 Secondary hyperparathyroidism of renal origin: Secondary | ICD-10-CM | POA: Diagnosis not present

## 2017-11-01 DIAGNOSIS — N251 Nephrogenic diabetes insipidus: Secondary | ICD-10-CM | POA: Diagnosis not present

## 2017-11-04 DIAGNOSIS — N186 End stage renal disease: Secondary | ICD-10-CM | POA: Diagnosis not present

## 2017-11-04 DIAGNOSIS — N251 Nephrogenic diabetes insipidus: Secondary | ICD-10-CM | POA: Diagnosis not present

## 2017-11-04 DIAGNOSIS — N2581 Secondary hyperparathyroidism of renal origin: Secondary | ICD-10-CM | POA: Diagnosis not present

## 2017-11-04 DIAGNOSIS — D631 Anemia in chronic kidney disease: Secondary | ICD-10-CM | POA: Diagnosis not present

## 2017-11-06 DIAGNOSIS — D631 Anemia in chronic kidney disease: Secondary | ICD-10-CM | POA: Diagnosis not present

## 2017-11-06 DIAGNOSIS — N186 End stage renal disease: Secondary | ICD-10-CM | POA: Diagnosis not present

## 2017-11-06 DIAGNOSIS — N2581 Secondary hyperparathyroidism of renal origin: Secondary | ICD-10-CM | POA: Diagnosis not present

## 2017-11-06 DIAGNOSIS — N251 Nephrogenic diabetes insipidus: Secondary | ICD-10-CM | POA: Diagnosis not present

## 2017-11-08 DIAGNOSIS — N186 End stage renal disease: Secondary | ICD-10-CM | POA: Diagnosis not present

## 2017-11-08 DIAGNOSIS — N2581 Secondary hyperparathyroidism of renal origin: Secondary | ICD-10-CM | POA: Diagnosis not present

## 2017-11-08 DIAGNOSIS — N251 Nephrogenic diabetes insipidus: Secondary | ICD-10-CM | POA: Diagnosis not present

## 2017-11-08 DIAGNOSIS — D631 Anemia in chronic kidney disease: Secondary | ICD-10-CM | POA: Diagnosis not present

## 2017-11-11 DIAGNOSIS — N2581 Secondary hyperparathyroidism of renal origin: Secondary | ICD-10-CM | POA: Diagnosis not present

## 2017-11-11 DIAGNOSIS — N251 Nephrogenic diabetes insipidus: Secondary | ICD-10-CM | POA: Diagnosis not present

## 2017-11-11 DIAGNOSIS — D631 Anemia in chronic kidney disease: Secondary | ICD-10-CM | POA: Diagnosis not present

## 2017-11-11 DIAGNOSIS — N186 End stage renal disease: Secondary | ICD-10-CM | POA: Diagnosis not present

## 2017-11-13 DIAGNOSIS — D631 Anemia in chronic kidney disease: Secondary | ICD-10-CM | POA: Diagnosis not present

## 2017-11-13 DIAGNOSIS — N186 End stage renal disease: Secondary | ICD-10-CM | POA: Diagnosis not present

## 2017-11-13 DIAGNOSIS — N2581 Secondary hyperparathyroidism of renal origin: Secondary | ICD-10-CM | POA: Diagnosis not present

## 2017-11-13 DIAGNOSIS — N251 Nephrogenic diabetes insipidus: Secondary | ICD-10-CM | POA: Diagnosis not present

## 2017-11-15 DIAGNOSIS — N2581 Secondary hyperparathyroidism of renal origin: Secondary | ICD-10-CM | POA: Diagnosis not present

## 2017-11-15 DIAGNOSIS — D631 Anemia in chronic kidney disease: Secondary | ICD-10-CM | POA: Diagnosis not present

## 2017-11-15 DIAGNOSIS — N186 End stage renal disease: Secondary | ICD-10-CM | POA: Diagnosis not present

## 2017-11-15 DIAGNOSIS — N251 Nephrogenic diabetes insipidus: Secondary | ICD-10-CM | POA: Diagnosis not present

## 2017-11-18 DIAGNOSIS — N186 End stage renal disease: Secondary | ICD-10-CM | POA: Diagnosis not present

## 2017-11-18 DIAGNOSIS — D631 Anemia in chronic kidney disease: Secondary | ICD-10-CM | POA: Diagnosis not present

## 2017-11-18 DIAGNOSIS — N251 Nephrogenic diabetes insipidus: Secondary | ICD-10-CM | POA: Diagnosis not present

## 2017-11-18 DIAGNOSIS — N2581 Secondary hyperparathyroidism of renal origin: Secondary | ICD-10-CM | POA: Diagnosis not present

## 2017-11-19 ENCOUNTER — Telehealth: Payer: Self-pay

## 2017-11-19 ENCOUNTER — Ambulatory Visit (HOSPITAL_COMMUNITY)
Admission: RE | Admit: 2017-11-19 | Discharge: 2017-11-19 | Disposition: A | Discharge status: Home / Self Care | Payer: Medicare Other | Source: Ambulatory Visit | Attending: Gastroenterology | Admitting: Gastroenterology

## 2017-11-19 ENCOUNTER — Other Ambulatory Visit: Payer: Self-pay | Admitting: *Deleted

## 2017-11-19 ENCOUNTER — Encounter (HOSPITAL_COMMUNITY)
Admission: RE | Disposition: A | Discharge status: Home / Self Care | Payer: Self-pay | Source: Ambulatory Visit | Attending: Gastroenterology

## 2017-11-19 ENCOUNTER — Encounter (HOSPITAL_COMMUNITY): Payer: Self-pay | Admitting: *Deleted

## 2017-11-19 ENCOUNTER — Encounter: Payer: Self-pay | Admitting: *Deleted

## 2017-11-19 DIAGNOSIS — D649 Anemia, unspecified: Secondary | ICD-10-CM | POA: Insufficient documentation

## 2017-11-19 DIAGNOSIS — R634 Abnormal weight loss: Secondary | ICD-10-CM

## 2017-11-19 DIAGNOSIS — Z538 Procedure and treatment not carried out for other reasons: Secondary | ICD-10-CM | POA: Insufficient documentation

## 2017-11-19 SURGERY — EGD (ESOPHAGOGASTRODUODENOSCOPY)
Anesthesia: Moderate Sedation

## 2017-11-19 NOTE — Telephone Encounter (Signed)
Madison [name] at [facility] called office. They are unable to do EGD today d/t pt ate. Madison [name] said Dr. Oneida Alar wants EGD rescheduled. Pt wants to come by office to reschedule and get instructions.

## 2017-11-19 NOTE — OR Nursing (Signed)
Patient arrived to have an EGD. Patient states she ate grits, a apple and a tangerine at 0900. Dr. Oneida Alar notified and said to have patient's procedure rescheduled. Tretha Sciara notified of the above. Patient is going by the office now to get her new instructions and appointment time.

## 2017-11-19 NOTE — Telephone Encounter (Signed)
Patient came by with daughter. She has been rescheduled to 12/19/17 at 8:30am. Requested early morning appt to ensure patient does not eat anything. Instructions given to pt and a copy as well to pt daughter.

## 2017-11-20 DIAGNOSIS — N251 Nephrogenic diabetes insipidus: Secondary | ICD-10-CM | POA: Diagnosis not present

## 2017-11-20 DIAGNOSIS — N186 End stage renal disease: Secondary | ICD-10-CM | POA: Diagnosis not present

## 2017-11-20 DIAGNOSIS — Z538 Procedure and treatment not carried out for other reasons: Secondary | ICD-10-CM | POA: Diagnosis not present

## 2017-11-20 DIAGNOSIS — D649 Anemia, unspecified: Secondary | ICD-10-CM | POA: Diagnosis not present

## 2017-11-20 DIAGNOSIS — D631 Anemia in chronic kidney disease: Secondary | ICD-10-CM | POA: Diagnosis not present

## 2017-11-20 DIAGNOSIS — N2581 Secondary hyperparathyroidism of renal origin: Secondary | ICD-10-CM | POA: Diagnosis not present

## 2017-11-21 DIAGNOSIS — Z0000 Encounter for general adult medical examination without abnormal findings: Secondary | ICD-10-CM | POA: Diagnosis not present

## 2017-11-21 DIAGNOSIS — N186 End stage renal disease: Secondary | ICD-10-CM | POA: Diagnosis not present

## 2017-11-21 DIAGNOSIS — Z992 Dependence on renal dialysis: Secondary | ICD-10-CM | POA: Diagnosis not present

## 2017-11-22 DIAGNOSIS — D631 Anemia in chronic kidney disease: Secondary | ICD-10-CM | POA: Diagnosis not present

## 2017-11-22 DIAGNOSIS — N186 End stage renal disease: Secondary | ICD-10-CM | POA: Diagnosis not present

## 2017-11-22 DIAGNOSIS — N2581 Secondary hyperparathyroidism of renal origin: Secondary | ICD-10-CM | POA: Diagnosis not present

## 2017-11-25 DIAGNOSIS — N2581 Secondary hyperparathyroidism of renal origin: Secondary | ICD-10-CM | POA: Diagnosis not present

## 2017-11-25 DIAGNOSIS — N186 End stage renal disease: Secondary | ICD-10-CM | POA: Diagnosis not present

## 2017-11-25 DIAGNOSIS — D631 Anemia in chronic kidney disease: Secondary | ICD-10-CM | POA: Diagnosis not present

## 2017-11-27 DIAGNOSIS — N2581 Secondary hyperparathyroidism of renal origin: Secondary | ICD-10-CM | POA: Diagnosis not present

## 2017-11-27 DIAGNOSIS — N186 End stage renal disease: Secondary | ICD-10-CM | POA: Diagnosis not present

## 2017-11-27 DIAGNOSIS — D631 Anemia in chronic kidney disease: Secondary | ICD-10-CM | POA: Diagnosis not present

## 2017-11-29 DIAGNOSIS — D631 Anemia in chronic kidney disease: Secondary | ICD-10-CM | POA: Diagnosis not present

## 2017-11-29 DIAGNOSIS — N186 End stage renal disease: Secondary | ICD-10-CM | POA: Diagnosis not present

## 2017-11-29 DIAGNOSIS — N2581 Secondary hyperparathyroidism of renal origin: Secondary | ICD-10-CM | POA: Diagnosis not present

## 2017-12-02 DIAGNOSIS — N186 End stage renal disease: Secondary | ICD-10-CM | POA: Diagnosis not present

## 2017-12-02 DIAGNOSIS — D631 Anemia in chronic kidney disease: Secondary | ICD-10-CM | POA: Diagnosis not present

## 2017-12-02 DIAGNOSIS — N2581 Secondary hyperparathyroidism of renal origin: Secondary | ICD-10-CM | POA: Diagnosis not present

## 2017-12-04 DIAGNOSIS — N186 End stage renal disease: Secondary | ICD-10-CM | POA: Diagnosis not present

## 2017-12-04 DIAGNOSIS — D631 Anemia in chronic kidney disease: Secondary | ICD-10-CM | POA: Diagnosis not present

## 2017-12-04 DIAGNOSIS — N2581 Secondary hyperparathyroidism of renal origin: Secondary | ICD-10-CM | POA: Diagnosis not present

## 2017-12-06 DIAGNOSIS — N2581 Secondary hyperparathyroidism of renal origin: Secondary | ICD-10-CM | POA: Diagnosis not present

## 2017-12-06 DIAGNOSIS — N186 End stage renal disease: Secondary | ICD-10-CM | POA: Diagnosis not present

## 2017-12-06 DIAGNOSIS — D631 Anemia in chronic kidney disease: Secondary | ICD-10-CM | POA: Diagnosis not present

## 2017-12-09 DIAGNOSIS — N2581 Secondary hyperparathyroidism of renal origin: Secondary | ICD-10-CM | POA: Diagnosis not present

## 2017-12-09 DIAGNOSIS — D631 Anemia in chronic kidney disease: Secondary | ICD-10-CM | POA: Diagnosis not present

## 2017-12-09 DIAGNOSIS — N186 End stage renal disease: Secondary | ICD-10-CM | POA: Diagnosis not present

## 2017-12-11 DIAGNOSIS — N186 End stage renal disease: Secondary | ICD-10-CM | POA: Diagnosis not present

## 2017-12-11 DIAGNOSIS — D631 Anemia in chronic kidney disease: Secondary | ICD-10-CM | POA: Diagnosis not present

## 2017-12-11 DIAGNOSIS — N2581 Secondary hyperparathyroidism of renal origin: Secondary | ICD-10-CM | POA: Diagnosis not present

## 2017-12-13 DIAGNOSIS — T83498A Other mechanical complication of other prosthetic devices, implants and grafts of genital tract, initial encounter: Secondary | ICD-10-CM | POA: Diagnosis not present

## 2017-12-13 DIAGNOSIS — D631 Anemia in chronic kidney disease: Secondary | ICD-10-CM | POA: Diagnosis not present

## 2017-12-13 DIAGNOSIS — N2581 Secondary hyperparathyroidism of renal origin: Secondary | ICD-10-CM | POA: Diagnosis not present

## 2017-12-13 DIAGNOSIS — N186 End stage renal disease: Secondary | ICD-10-CM | POA: Diagnosis not present

## 2017-12-13 DIAGNOSIS — T83198A Other mechanical complication of other urinary devices and implants, initial encounter: Secondary | ICD-10-CM | POA: Diagnosis not present

## 2017-12-16 DIAGNOSIS — D631 Anemia in chronic kidney disease: Secondary | ICD-10-CM | POA: Diagnosis not present

## 2017-12-16 DIAGNOSIS — N2581 Secondary hyperparathyroidism of renal origin: Secondary | ICD-10-CM | POA: Diagnosis not present

## 2017-12-16 DIAGNOSIS — N186 End stage renal disease: Secondary | ICD-10-CM | POA: Diagnosis not present

## 2017-12-18 DIAGNOSIS — D631 Anemia in chronic kidney disease: Secondary | ICD-10-CM | POA: Diagnosis not present

## 2017-12-18 DIAGNOSIS — N186 End stage renal disease: Secondary | ICD-10-CM | POA: Diagnosis not present

## 2017-12-18 DIAGNOSIS — N2581 Secondary hyperparathyroidism of renal origin: Secondary | ICD-10-CM | POA: Diagnosis not present

## 2017-12-19 ENCOUNTER — Other Ambulatory Visit: Payer: Self-pay

## 2017-12-19 ENCOUNTER — Encounter (HOSPITAL_COMMUNITY)
Admission: RE | Disposition: A | Discharge status: Home / Self Care | Payer: Self-pay | Source: Ambulatory Visit | Attending: Gastroenterology

## 2017-12-19 ENCOUNTER — Encounter (HOSPITAL_COMMUNITY): Payer: Self-pay | Admitting: Gastroenterology

## 2017-12-19 ENCOUNTER — Ambulatory Visit (HOSPITAL_COMMUNITY)
Admission: RE | Admit: 2017-12-19 | Discharge: 2017-12-19 | Disposition: A | Discharge status: Home / Self Care | Payer: Medicare Other | Source: Ambulatory Visit | Attending: Gastroenterology | Admitting: Gastroenterology

## 2017-12-19 DIAGNOSIS — K449 Diaphragmatic hernia without obstruction or gangrene: Secondary | ICD-10-CM | POA: Diagnosis not present

## 2017-12-19 DIAGNOSIS — Z79899 Other long term (current) drug therapy: Secondary | ICD-10-CM | POA: Diagnosis not present

## 2017-12-19 DIAGNOSIS — K3189 Other diseases of stomach and duodenum: Secondary | ICD-10-CM | POA: Diagnosis not present

## 2017-12-19 DIAGNOSIS — F419 Anxiety disorder, unspecified: Secondary | ICD-10-CM | POA: Diagnosis not present

## 2017-12-19 DIAGNOSIS — Z9049 Acquired absence of other specified parts of digestive tract: Secondary | ICD-10-CM | POA: Diagnosis not present

## 2017-12-19 DIAGNOSIS — D649 Anemia, unspecified: Secondary | ICD-10-CM | POA: Diagnosis not present

## 2017-12-19 DIAGNOSIS — K2970 Gastritis, unspecified, without bleeding: Secondary | ICD-10-CM | POA: Diagnosis not present

## 2017-12-19 DIAGNOSIS — K2980 Duodenitis without bleeding: Secondary | ICD-10-CM

## 2017-12-19 DIAGNOSIS — K2950 Unspecified chronic gastritis without bleeding: Secondary | ICD-10-CM | POA: Insufficient documentation

## 2017-12-19 DIAGNOSIS — R634 Abnormal weight loss: Secondary | ICD-10-CM | POA: Diagnosis not present

## 2017-12-19 DIAGNOSIS — Z992 Dependence on renal dialysis: Secondary | ICD-10-CM | POA: Diagnosis not present

## 2017-12-19 DIAGNOSIS — G629 Polyneuropathy, unspecified: Secondary | ICD-10-CM | POA: Insufficient documentation

## 2017-12-19 DIAGNOSIS — F209 Schizophrenia, unspecified: Secondary | ICD-10-CM | POA: Diagnosis not present

## 2017-12-19 DIAGNOSIS — N186 End stage renal disease: Secondary | ICD-10-CM | POA: Diagnosis not present

## 2017-12-19 HISTORY — PX: ESOPHAGOGASTRODUODENOSCOPY: SHX5428

## 2017-12-19 LAB — GLUCOSE, CAPILLARY: GLUCOSE-CAPILLARY: 74 mg/dL (ref 65–99)

## 2017-12-19 SURGERY — EGD (ESOPHAGOGASTRODUODENOSCOPY)
Anesthesia: Moderate Sedation

## 2017-12-19 MED ORDER — MIDAZOLAM HCL 5 MG/5ML IJ SOLN
INTRAMUSCULAR | Status: DC | PRN
Start: 2017-12-19 — End: 2017-12-19
  Administered 2017-12-19: 2 mg via INTRAVENOUS

## 2017-12-19 MED ORDER — MIDAZOLAM HCL 5 MG/5ML IJ SOLN
INTRAMUSCULAR | Status: AC
  Filled 2017-12-19: qty 10

## 2017-12-19 MED ORDER — SODIUM CHLORIDE 0.9 % IV SOLN
INTRAVENOUS | Status: DC
  Administered 2017-12-19: 08:00:00 via INTRAVENOUS

## 2017-12-19 MED ORDER — PANTOPRAZOLE SODIUM 40 MG PO TBEC: DELAYED_RELEASE_TABLET | ORAL | 11 refills | Status: DC

## 2017-12-19 MED ORDER — LIDOCAINE VISCOUS 2 % MT SOLN
OROMUCOSAL | Status: DC | PRN
  Administered 2017-12-19: 1 via OROMUCOSAL

## 2017-12-19 MED ORDER — MEPERIDINE HCL 100 MG/ML IJ SOLN
INTRAMUSCULAR | Status: AC
  Filled 2017-12-19: qty 2

## 2017-12-19 MED ORDER — LIDOCAINE VISCOUS 2 % MT SOLN
OROMUCOSAL | Status: AC
  Filled 2017-12-19: qty 15

## 2017-12-19 NOTE — H&P (Signed)
Primary Care Physician:  Lucianne Lei, MD Primary Gastroenterologist:  Dr. Oneida Alar  Pre-Procedure History & Physical: HPI:  Madison [name] is a 67 y.o. female here for Anemia/weight loss.  Past Medical History:  Diagnosis Date  . Anxiety   . ESRD (end stage renal disease) on dialysis ([place])   . Neuropathy   . Schizophrenia [facility])     Past Surgical History:  Procedure Laterality Date  . CHOLECYSTECTOMY    . COLONOSCOPY N/A 01/20/2016   Procedure: COLONOSCOPY;  Surgeon: [name], MD;  Location: [facility];  Service: Endoscopy;  Laterality: N/A;  1:15 PM-moved to 130 Office to notify  . fistula left arm    . HEMICOLECTOMY Right   . TUBAL LIGATION      Prior to Admission medications   Medication Sig Start Date End Date Taking? Authorizing Provider  ferric citrate (AURYXIA) 1 GM 210 MG(Fe) tablet Take 210 mg by mouth 3 (three) times daily with meals.    Yes [provider]  gabapentin (NEURONTIN) 300 MG capsule Take 300 mg by mouth at bedtime.    Yes [provider]  mirtazapine (REMERON) 30 MG tablet Take 30 mg by mouth at bedtime.    Yes [provider]  OLANZapine (ZYPREXA) 15 MG tablet Take 15 mg by mouth at bedtime.    Yes [provider]  thiamine (VITAMIN B-1) 100 MG tablet Take 50 mg by mouth daily.    Yes [provider]  furosemide (LASIX) 20 MG tablet Take 1 tablet (20 mg total) by mouth daily. Restart in 3 days Patient not taking: Reported on 10/17/2017 04/18/16   Kathie Dike, MD  iron polysaccharides (NIFEREX) 150 MG capsule Take 1 capsule (150 mg total) by mouth daily. Patient not taking: Reported on 10/17/2017 08/18/17   Fritzi Mandes, MD  metoprolol tartrate (LOPRESSOR) 25 MG tablet Take 1 tablet (25 mg total) by mouth 2 (two) times daily. Patient not taking: Reported on 11/12/2017 04/18/16   Kathie Dike, MD    Allergies as of 11/19/2017  . (No Known Allergies)    Family History  Problem Relation Age of Onset   . Alzheimer's disease Mother   . Cancer Father   . Kidney failure Sister     Social History   Socioeconomic History  . Marital status: Single    Spouse name: Not on file  . Number of children: Not on file  . Years of education: Not on file  . Highest education level: Not on file  Occupational History  . Not on file  Social Needs  . Financial resource strain: Not on file  . Food insecurity:    Worry: Not on file    Inability: Not on file  . Transportation needs:    Medical: Not on file    Non-medical: Not on file  Tobacco Use  . Smoking status: Never Smoker  . Smokeless tobacco: Never Used  Substance and Sexual Activity  . Alcohol use: No  . Drug use: No  . Sexual activity: Not on file  Lifestyle  . Physical activity:    Days per week: Not on file    Minutes per session: Not on file  . Stress: Not on file  Relationships  . Social connections:    Talks on phone: Not on file    Gets together: Not on file    Attends religious service: Not on file    Active member of club or organization: Not on file    Attends  meetings of clubs or organizations: Not on file    Relationship status: Not on file  . Intimate partner violence:    Fear of current or ex partner: Not on file    Emotionally abused: Not on file    Physically abused: Not on file    Forced sexual activity: Not on file  Other Topics Concern  . Not on file  Social History Narrative  . Not on file    Review of Systems: See HPI, otherwise negative ROS   Physical Exam: BP 113/62   Pulse 76   Temp 98.7 F (37.1 C) (Oral)   Resp 18   SpO2 100%  General:   Alert,  pleasant and cooperative in NAD Head:  Normocephalic and atraumatic. Neck:  Supple; Lungs:  Clear throughout to auscultation.    Heart:  Regular rate and rhythm. Abdomen:  Soft, nontender and nondistended. Normal bowel sounds, without guarding, and without rebound.   Neurologic:  Alert and  oriented x4;  grossly normal  neurologically.  Impression/Plan:     Anemia/weight loss  PLAN:  1. EGD TODAY. DISCUSSED PROCEDURE, BENEFITS, & RISKS: < 1% chance of medication reaction, bleeding, OR perforation.

## 2017-12-19 NOTE — Discharge Instructions (Signed)
You have gastritis & DUODENITIS, which can cause chronic low blood count ALONG WITH KIDNEY DISEASE. YOU HAVE A MODERATE SIZE HIATAL HERNIA. I biopsied your stomach & DUODENUM.   YOUR BLOOD PRESSURE IS SLIGHTLY ELEVATED TODAY. 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 YOU SHOULD TALK WITH YOUR KIDNEY DOCTOR OR DR. BLAND AND ASK THEM IF YOU NEED BLOOD PRESSURE MEDICINE.  TO TREAT GASTRITIS AND DUODENITIS, START PROTONIX. TAKE 30 MINUTES PRIOR TO MEALS TWICE DAILY.  YOUR BIOPSY WILL BE BACK IN 7 DAYS.  FOLLOW UP IN JUL 2019.   UPPER ENDOSCOPY AFTER CARE Read the instructions outlined below and refer to this sheet in the next week. These discharge instructions provide you with general information on caring for yourself after you leave the hospital. While your treatment has been planned according to the most current medical practices available, unavoidable complications occasionally occur. If you have any problems or questions after discharge, call DR. Imelda Dandridge, [phone].  ACTIVITY  You may resume your regular activity, but move at a slower pace for the next 24 hours.   Take frequent rest periods for the next 24 hours.   Walking will help get rid of the air and reduce the bloated feeling in your belly (abdomen).   No driving for 24 hours (because of the medicine (anesthesia) used during the test).   You may shower.   Do not sign any important legal documents or operate any machinery for 24 hours (because of the anesthesia used during the test).    NUTRITION  Drink plenty of fluids.   You may resume your normal diet as instructed by your doctor.   Begin with a light meal and progress to your normal diet. Heavy or fried foods are harder to digest and may make you feel sick to your stomach (nauseated).   Avoid alcoholic beverages for 24 hours or as instructed.    MEDICATIONS  You may resume your normal medications.   WHAT YOU CAN EXPECT TODAY  Some feelings of bloating in the abdomen.    Passage of more gas than usual.    IF YOU HAD A BIOPSY TAKEN DURING THE UPPER ENDOSCOPY:  Eat a soft diet IF YOU HAVE NAUSEA, BLOATING, ABDOMINAL PAIN, OR VOMITING.    FINDING OUT THE RESULTS OF YOUR TEST Not all test results are available during your visit. DR. Oneida Alar WILL CALL YOU WITHIN 14 DAYS OF YOUR PROCEDUE WITH YOUR RESULTS. Do not assume everything is normal if you have not heard from DR. Chamari Cutbirth, CALL HER OFFICE AT [phone].  SEEK IMMEDIATE MEDICAL ATTENTION AND CALL THE OFFICE: [phone] IF:  You have more than a spotting of blood in your stool.   Your belly is swollen (abdominal distention).   You are nauseated or vomiting.   You have a temperature over 101F.   You have abdominal pain or discomfort that is severe or gets worse throughout the day.   Gastritis/DUODENITIS  Gastritis is an inflammation (the body's way of reacting to injury and/or infection) of the stomach. DUODENITIS is an inflammation (the body's way of reacting to injury and/or infection) of the FIRST PART OF THE SMALL INTESTINES. It is often caused by bacterial (germ) infections. It can also be caused BY ASPIRIN, BC/GOODY POWDER'S, (IBUPROFEN) MOTRIN, OR ALEVE (NAPROXEN), chemicals (including alcohol), SPICY FOODS, and medications. This illness may be associated with generalized malaise (feeling tired, not well), UPPER ABDOMINAL STOMACH cramps, and fever. One common bacterial cause of gastritis is an organism known as H. Pylori. This can be  treated with antibiotics.

## 2017-12-19 NOTE — Op Note (Signed)
[facility] Patient Name: Madison [name] Procedure Date: 12/19/2017 8:11 AM MRN: [id number] Date of Birth: [birth year] Attending MD: Barney Drain MD, MD CSN: [id number] Age: 67 Admit Type: Outpatient Procedure:                Upper GI endoscopy WITH COLD FORCEPS BIOPSY Indications:              Anemia, Weight loss Providers:                Barney Drain MD, MD, Janeece Riggers, RN, Nelma Rothman,                            Technician Referring MD:             Elyn Peers MD Medicines:                Midazolam 2 mg IV Complications:            No immediate complications. Estimated Blood Loss:     Estimated blood loss was minimal. Procedure:                Pre-Anesthesia Assessment:                           - Prior to the procedure, a History and Physical                            was performed, and patient medications and                            allergies were reviewed. The patient's tolerance of                            previous anesthesia was also reviewed. The risks                            and benefits of the procedure and the sedation                            options and risks were discussed with the patient.                            All questions were answered, and informed consent                            was obtained. Prior Anticoagulants: The patient has                            taken heparin, last dose was 1 day prior to                            procedure. ASA Grade Assessment: II - A patient                            with mild systemic disease. After reviewing the  risks and benefits, the patient was deemed in                            satisfactory condition to undergo the procedure.                            After obtaining informed consent, the endoscope was                            passed under direct vision. Throughout the                            procedure, the patient's blood pressure, pulse, and                             oxygen saturations were monitored continuously. The                            [id number] ([id number]) scope was introduced through the                            mouth, and advanced to the second part of duodenum.                            The upper GI endoscopy was accomplished without                            difficulty. The patient tolerated the procedure                            well. Scope In: 8:44:07 AM Scope Out: 8:54:21 AM Total Procedure Duration: 0 hours 10 minutes 14 seconds  Findings:      The examined esophagus was normal.      Diffuse moderate inflammation characterized by congestion (edema) and       erythema was found in the entire examined stomach. Biopsies were taken       with a cold forceps for Helicobacter pylori testing.      A medium-sized hiatal hernia was present.      Patchy mild inflammation characterized by congestion (edema) and       erythema was found in the duodenal bulb. Biopsies for histology were       taken with a cold forceps for evaluation of celiac disease.      The second portion of the duodenum was normal. Biopsies for histology       were taken with a cold forceps for evaluation of celiac disease. Impression:               - WEIGHT LOSS LIKELY DUE TO GASTRITIS                           - ANEMIA LIKELY DUE TO CHRONIC DISEASE AND                            Gastritis/DUODENITIS.                           -  Medium-sized hiatal hernia. Moderate Sedation:      Moderate (conscious) sedation was administered by the endoscopy nurse       and supervised by the endoscopist. The following parameters were       monitored: oxygen saturation, heart rate, blood pressure, and response       to care. Total physician intraservice time was 17 minutes. Recommendation:           - Await pathology results.                           - Low fat diet.                           - Continue present medications.                           - Return to my office in 4  months.                           - Patient has a contact number available for                            emergencies. The signs and symptoms of potential                            delayed complications were discussed with the                            patient. Return to normal activities tomorrow.                            Written discharge instructions were provided to the                            patient. Procedure Code(s):        --- Professional ---                           [phone], Esophagogastroduodenoscopy, flexible,                            transoral; with biopsy, single or multiple                           99152, Moderate sedation services provided by the                            same physician or other qualified health care                            professional performing the diagnostic or                            therapeutic service that the sedation supports,  requiring the presence of an independent trained                            observer to assist in the monitoring of the                            patient's level of consciousness and physiological                            status; initial 15 minutes of intraservice time,                            patient age 68 years or older Diagnosis Code(s):        --- Professional ---                           K29.70, Gastritis, unspecified, without bleeding                           K44.9, Diaphragmatic hernia without obstruction or                            gangrene                           K29.80, Duodenitis without bleeding                           D64.9, Anemia, unspecified                           R63.4, Abnormal weight loss CPT copyright 2016 American Medical Association. All rights reserved. The codes documented in this report are preliminary and upon coder review may  be revised to meet current compliance requirements. Barney Drain, MD Barney Drain MD, MD 12/19/2017 9:08:25  AM This report has been signed electronically. Number of Addenda: 0

## 2017-12-20 DIAGNOSIS — N2581 Secondary hyperparathyroidism of renal origin: Secondary | ICD-10-CM | POA: Diagnosis not present

## 2017-12-20 DIAGNOSIS — D631 Anemia in chronic kidney disease: Secondary | ICD-10-CM | POA: Diagnosis not present

## 2017-12-20 DIAGNOSIS — N186 End stage renal disease: Secondary | ICD-10-CM | POA: Diagnosis not present

## 2017-12-22 ENCOUNTER — Encounter: Payer: Self-pay | Admitting: Gastroenterology

## 2017-12-22 ENCOUNTER — Telehealth: Payer: Self-pay | Admitting: Gastroenterology

## 2017-12-22 DIAGNOSIS — N186 End stage renal disease: Secondary | ICD-10-CM | POA: Diagnosis not present

## 2017-12-22 DIAGNOSIS — Z992 Dependence on renal dialysis: Secondary | ICD-10-CM | POA: Diagnosis not present

## 2017-12-22 NOTE — Telephone Encounter (Signed)
Please call pt. Madison [name] stomach Bx shows gastritis/DUODENITIS.    TALK WITH YOUR KIDNEY DOCTOR OR DR. BLAND AND ASK THEM IF YOU NEED BLOOD PRESSURE MEDICINE.  TO TREAT GASTRITIS AND DUODENITIS, START PROTONIX. TAKE 30 MINUTES PRIOR TO MEALS TWICE DAILY FOR ONCE MONTH THEN ONCE DAILY.  YOU NEEDS TO SWALLOW THE CAMERA PILL TO COMPLETE THE EVALUATION FOR YOUR ANEMIA(NOMRMOCYTIC)  FOLLOW UP IN JUL 2019 E30 ANEMIA/GASTRITIS/DUODENITIS.

## 2017-12-23 ENCOUNTER — Encounter: Payer: Self-pay | Admitting: Gastroenterology

## 2017-12-23 DIAGNOSIS — N2581 Secondary hyperparathyroidism of renal origin: Secondary | ICD-10-CM | POA: Diagnosis not present

## 2017-12-23 DIAGNOSIS — N186 End stage renal disease: Secondary | ICD-10-CM | POA: Diagnosis not present

## 2017-12-23 DIAGNOSIS — D631 Anemia in chronic kidney disease: Secondary | ICD-10-CM | POA: Diagnosis not present

## 2017-12-23 NOTE — Telephone Encounter (Signed)
LMOVM to schedule givens capsule study

## 2017-12-23 NOTE — Telephone Encounter (Signed)
Pt's caregiver, Baird Kay is aware. She said pt was told to STOP IRON at the procedure. She said she has to have the iron because of her dialysis. When she doesn't, she has to end up having transfusions.  Dr. Oneida Alar, please advise! Forwarding to [facility] to schedule the camera pill.

## 2017-12-23 NOTE — Telephone Encounter (Signed)
ON RECALL  °

## 2017-12-23 NOTE — Telephone Encounter (Signed)
PT WAS TO RESUME IRON AFTER HER EGD. SHE SHOULD HOLD IT FOR 7 DAYS PRIOR TO HER GIVENS CAPSULE STUDY.

## 2017-12-23 NOTE — Telephone Encounter (Signed)
LMOM to call.

## 2017-12-24 ENCOUNTER — Encounter (HOSPITAL_COMMUNITY): Payer: Self-pay | Admitting: Gastroenterology

## 2017-12-24 ENCOUNTER — Other Ambulatory Visit: Payer: Self-pay | Admitting: *Deleted

## 2017-12-24 ENCOUNTER — Telehealth: Payer: Self-pay | Admitting: Gastroenterology

## 2017-12-24 DIAGNOSIS — F411 Generalized anxiety disorder: Secondary | ICD-10-CM | POA: Diagnosis not present

## 2017-12-24 DIAGNOSIS — F3132 Bipolar disorder, current episode depressed, moderate: Secondary | ICD-10-CM | POA: Diagnosis not present

## 2017-12-24 DIAGNOSIS — D649 Anemia, unspecified: Secondary | ICD-10-CM

## 2017-12-24 NOTE — Telephone Encounter (Signed)
Madison [name] is aware of restarting iron, but needs to continue to hold til after the Givens. She said OK to call her this afternoon @ [phone] to schedule the Givens. They are at another doctor visit now.

## 2017-12-24 NOTE — Telephone Encounter (Signed)
LMOVM

## 2017-12-24 NOTE — Telephone Encounter (Signed)
Returning call. Please call back at [phone]

## 2017-12-24 NOTE — Telephone Encounter (Signed)
Spoke with [place] and GIVENS scheduled for 01/14/18 at 7:30am. Instructions discussed/mailed to [facility].

## 2017-12-24 NOTE — Telephone Encounter (Signed)
Pt has spoken to [place] and has all information about testing.

## 2017-12-25 DIAGNOSIS — D631 Anemia in chronic kidney disease: Secondary | ICD-10-CM | POA: Diagnosis not present

## 2017-12-25 DIAGNOSIS — N2581 Secondary hyperparathyroidism of renal origin: Secondary | ICD-10-CM | POA: Diagnosis not present

## 2017-12-25 DIAGNOSIS — N186 End stage renal disease: Secondary | ICD-10-CM | POA: Diagnosis not present

## 2017-12-27 DIAGNOSIS — N2581 Secondary hyperparathyroidism of renal origin: Secondary | ICD-10-CM | POA: Diagnosis not present

## 2017-12-27 DIAGNOSIS — D631 Anemia in chronic kidney disease: Secondary | ICD-10-CM | POA: Diagnosis not present

## 2017-12-27 DIAGNOSIS — N186 End stage renal disease: Secondary | ICD-10-CM | POA: Diagnosis not present

## 2017-12-30 DIAGNOSIS — N2581 Secondary hyperparathyroidism of renal origin: Secondary | ICD-10-CM | POA: Diagnosis not present

## 2017-12-30 DIAGNOSIS — N186 End stage renal disease: Secondary | ICD-10-CM | POA: Diagnosis not present

## 2017-12-30 DIAGNOSIS — D631 Anemia in chronic kidney disease: Secondary | ICD-10-CM | POA: Diagnosis not present

## 2018-01-01 DIAGNOSIS — N2581 Secondary hyperparathyroidism of renal origin: Secondary | ICD-10-CM | POA: Diagnosis not present

## 2018-01-01 DIAGNOSIS — D631 Anemia in chronic kidney disease: Secondary | ICD-10-CM | POA: Diagnosis not present

## 2018-01-01 DIAGNOSIS — N251 Nephrogenic diabetes insipidus: Secondary | ICD-10-CM | POA: Diagnosis not present

## 2018-01-01 DIAGNOSIS — N186 End stage renal disease: Secondary | ICD-10-CM | POA: Diagnosis not present

## 2018-01-03 DIAGNOSIS — N186 End stage renal disease: Secondary | ICD-10-CM | POA: Diagnosis not present

## 2018-01-03 DIAGNOSIS — D631 Anemia in chronic kidney disease: Secondary | ICD-10-CM | POA: Diagnosis not present

## 2018-01-03 DIAGNOSIS — N2581 Secondary hyperparathyroidism of renal origin: Secondary | ICD-10-CM | POA: Diagnosis not present

## 2018-01-06 DIAGNOSIS — N186 End stage renal disease: Secondary | ICD-10-CM | POA: Diagnosis not present

## 2018-01-06 DIAGNOSIS — D631 Anemia in chronic kidney disease: Secondary | ICD-10-CM | POA: Diagnosis not present

## 2018-01-06 DIAGNOSIS — N2581 Secondary hyperparathyroidism of renal origin: Secondary | ICD-10-CM | POA: Diagnosis not present

## 2018-01-09 DIAGNOSIS — N2581 Secondary hyperparathyroidism of renal origin: Secondary | ICD-10-CM | POA: Diagnosis not present

## 2018-01-09 DIAGNOSIS — T82868A Thrombosis of vascular prosthetic devices, implants and grafts, initial encounter: Secondary | ICD-10-CM | POA: Diagnosis not present

## 2018-01-09 DIAGNOSIS — N186 End stage renal disease: Secondary | ICD-10-CM | POA: Diagnosis not present

## 2018-01-09 DIAGNOSIS — D631 Anemia in chronic kidney disease: Secondary | ICD-10-CM | POA: Diagnosis not present

## 2018-01-09 DIAGNOSIS — T82898A Other specified complication of vascular prosthetic devices, implants and grafts, initial encounter: Secondary | ICD-10-CM | POA: Diagnosis not present

## 2018-01-10 DIAGNOSIS — N186 End stage renal disease: Secondary | ICD-10-CM | POA: Diagnosis not present

## 2018-01-10 DIAGNOSIS — D631 Anemia in chronic kidney disease: Secondary | ICD-10-CM | POA: Diagnosis not present

## 2018-01-10 DIAGNOSIS — N2581 Secondary hyperparathyroidism of renal origin: Secondary | ICD-10-CM | POA: Diagnosis not present

## 2018-01-13 DIAGNOSIS — N2581 Secondary hyperparathyroidism of renal origin: Secondary | ICD-10-CM | POA: Diagnosis not present

## 2018-01-13 DIAGNOSIS — N186 End stage renal disease: Secondary | ICD-10-CM | POA: Diagnosis not present

## 2018-01-13 DIAGNOSIS — D631 Anemia in chronic kidney disease: Secondary | ICD-10-CM | POA: Diagnosis not present

## 2018-01-14 ENCOUNTER — Ambulatory Visit (HOSPITAL_COMMUNITY)
Admission: RE | Admit: 2018-01-14 | Discharge: 2018-01-14 | Disposition: A | Discharge status: Home / Self Care | Payer: Medicare Other | Source: Ambulatory Visit | Attending: Gastroenterology | Admitting: Gastroenterology

## 2018-01-14 ENCOUNTER — Encounter (HOSPITAL_COMMUNITY): Payer: Self-pay | Admitting: Gastroenterology

## 2018-01-14 ENCOUNTER — Encounter (HOSPITAL_COMMUNITY)
Admission: RE | Disposition: A | Discharge status: Home / Self Care | Payer: Self-pay | Source: Ambulatory Visit | Attending: Gastroenterology

## 2018-01-14 DIAGNOSIS — K635 Polyp of colon: Secondary | ICD-10-CM | POA: Diagnosis not present

## 2018-01-14 DIAGNOSIS — Z992 Dependence on renal dialysis: Secondary | ICD-10-CM | POA: Diagnosis not present

## 2018-01-14 DIAGNOSIS — D649 Anemia, unspecified: Secondary | ICD-10-CM

## 2018-01-14 DIAGNOSIS — N186 End stage renal disease: Secondary | ICD-10-CM | POA: Diagnosis not present

## 2018-01-14 DIAGNOSIS — D631 Anemia in chronic kidney disease: Secondary | ICD-10-CM | POA: Diagnosis not present

## 2018-01-14 DIAGNOSIS — K922 Gastrointestinal hemorrhage, unspecified: Secondary | ICD-10-CM | POA: Insufficient documentation

## 2018-01-14 HISTORY — PX: GIVENS CAPSULE STUDY: SHX5432

## 2018-01-14 SURGERY — IMAGING PROCEDURE, GI TRACT, INTRALUMINAL, VIA CAPSULE

## 2018-01-15 DIAGNOSIS — N2581 Secondary hyperparathyroidism of renal origin: Secondary | ICD-10-CM | POA: Diagnosis not present

## 2018-01-15 DIAGNOSIS — D631 Anemia in chronic kidney disease: Secondary | ICD-10-CM | POA: Diagnosis not present

## 2018-01-15 DIAGNOSIS — N186 End stage renal disease: Secondary | ICD-10-CM | POA: Diagnosis not present

## 2018-01-17 DIAGNOSIS — D631 Anemia in chronic kidney disease: Secondary | ICD-10-CM | POA: Diagnosis not present

## 2018-01-17 DIAGNOSIS — N2581 Secondary hyperparathyroidism of renal origin: Secondary | ICD-10-CM | POA: Diagnosis not present

## 2018-01-17 DIAGNOSIS — N186 End stage renal disease: Secondary | ICD-10-CM | POA: Diagnosis not present

## 2018-01-20 DIAGNOSIS — N2581 Secondary hyperparathyroidism of renal origin: Secondary | ICD-10-CM | POA: Diagnosis not present

## 2018-01-20 DIAGNOSIS — D631 Anemia in chronic kidney disease: Secondary | ICD-10-CM | POA: Diagnosis not present

## 2018-01-20 DIAGNOSIS — N186 End stage renal disease: Secondary | ICD-10-CM | POA: Diagnosis not present

## 2018-01-21 DIAGNOSIS — N186 End stage renal disease: Secondary | ICD-10-CM | POA: Diagnosis not present

## 2018-01-21 DIAGNOSIS — Z992 Dependence on renal dialysis: Secondary | ICD-10-CM | POA: Diagnosis not present

## 2018-01-22 DIAGNOSIS — D631 Anemia in chronic kidney disease: Secondary | ICD-10-CM | POA: Diagnosis not present

## 2018-01-22 DIAGNOSIS — E875 Hyperkalemia: Secondary | ICD-10-CM | POA: Diagnosis not present

## 2018-01-22 DIAGNOSIS — N186 End stage renal disease: Secondary | ICD-10-CM | POA: Diagnosis not present

## 2018-01-22 DIAGNOSIS — N2581 Secondary hyperparathyroidism of renal origin: Secondary | ICD-10-CM | POA: Diagnosis not present

## 2018-01-23 ENCOUNTER — Telehealth: Payer: Self-pay | Admitting: Gastroenterology

## 2018-01-23 NOTE — Telephone Encounter (Signed)
PLEASE CALL PT. HER GIVENS STUDY SHOWS OCCASIONAL BENIGN APPEARING POLYP IN THE SMALL BOWEL. HER LOW BLOOD COUNT IS MOST LIKELY DUE TO GASTRITIS/DUODENITIS AND KIDNEY FAILURE.   Plan: 1. CONTINUE PO IRON. WE CONSIDER BALLOON ENTEROSCOPY IF SHE DEVELOPS A NEED FOR BLODO TRANSFUSIONS OR EVIDENCE OF ACTIVE GI BLEED(MELENA/BRBPR) 2. SHE SHOULD HAVE A CBC/FERRITIN WITH NEXT DIALYSIS AND HAVE RESULTS FAXED TO [phone] 3. OPV IN 4 MOS E30 ANEMIA/OBSCURE GI BLEED.

## 2018-01-23 NOTE — Telephone Encounter (Signed)
LMOM to call.

## 2018-01-23 NOTE — Procedures (Addendum)
INDICATION: NORMOCYTIC ANEMIA (NOV Hb 8.3 MCV 90.5, ESRD), NOV 2018 FERRITIN 300, B12 1999, FOLATE 89.3)  PATIENT DATA:  GASTRIC PASSAGE TIME: 4 m, SB PASSAGE TIME: 5H 14m  RESULTS: LIMITED views of gastric mucosa due to retained contents.  No ULCERS, masses or AVMs seen. 3 POLYP SEEN IN JEJUNUM(03:43, 03:47, 03:50  LIMITED VIEWS OF THE COLON DUE TO RETAINED CONTENTS. No old blood or fresh blood in the stomach, small bowel, or colon.  DIAGNOSIS: OCCASIONAL BENIGN APPEARING POLYP IN JEJUNUM-NO ACTIVE BLEEDING. NORMOCYTIC ANEMIA MOST LIKELY DUE TO CHRONIC DISEASE.  Plan: 1. CONTINUE PO IRON. CONSIDER BALLOON ENTEROSCOPY IF PT DEVELOPS TRANSFUSION DEPENDENT ANEMIA OR EVIDENCE OF ACTIVE GI BLEED(MELENA/BRBPR) 2. RECHECK CBC/FERRITIN WITH NEXT DIALYSIS AND HAVE RESULTS FAXED TO [phone] 3. OPV IN 4 MOS.

## 2018-01-24 DIAGNOSIS — E875 Hyperkalemia: Secondary | ICD-10-CM | POA: Diagnosis not present

## 2018-01-24 DIAGNOSIS — N2581 Secondary hyperparathyroidism of renal origin: Secondary | ICD-10-CM | POA: Diagnosis not present

## 2018-01-24 DIAGNOSIS — N186 End stage renal disease: Secondary | ICD-10-CM | POA: Diagnosis not present

## 2018-01-24 DIAGNOSIS — D631 Anemia in chronic kidney disease: Secondary | ICD-10-CM | POA: Diagnosis not present

## 2018-01-27 ENCOUNTER — Other Ambulatory Visit: Payer: Self-pay

## 2018-01-27 DIAGNOSIS — D631 Anemia in chronic kidney disease: Secondary | ICD-10-CM | POA: Diagnosis not present

## 2018-01-27 DIAGNOSIS — N186 End stage renal disease: Secondary | ICD-10-CM | POA: Diagnosis not present

## 2018-01-27 DIAGNOSIS — E875 Hyperkalemia: Secondary | ICD-10-CM | POA: Diagnosis not present

## 2018-01-27 DIAGNOSIS — D649 Anemia, unspecified: Secondary | ICD-10-CM

## 2018-01-27 DIAGNOSIS — N2581 Secondary hyperparathyroidism of renal origin: Secondary | ICD-10-CM | POA: Diagnosis not present

## 2018-01-27 NOTE — Telephone Encounter (Signed)
I have received the lab results via fax and am faxing to Dr. Oneida Alar at Endo.

## 2018-01-27 NOTE — Telephone Encounter (Signed)
Pt's sister, [name], returned call. She was informed of results and plan. She said pt does Dialysis in [facility] @ [facility].  I called them at [phone] and spoke to [place].  Their fax number is [phone].  She said they do monthly monthly blood work, but they do not have inside lab and no way of sending out any orders. She said pt did have CBC and Ferritin from 01/01/2018 and she will fax those results to [place].

## 2018-01-27 NOTE — Telephone Encounter (Signed)
I called [facility] in [place] and [place] and called [facility] in [place]. They do not have this pt for dialysis. I have left Vm on her phone numbers and also a message on her emergency contact, Baird Kay for a return call.  I have printed the lab orders and have ready to fax when I find out where she has dialysis.

## 2018-01-29 DIAGNOSIS — D631 Anemia in chronic kidney disease: Secondary | ICD-10-CM | POA: Diagnosis not present

## 2018-01-29 DIAGNOSIS — E875 Hyperkalemia: Secondary | ICD-10-CM | POA: Diagnosis not present

## 2018-01-29 DIAGNOSIS — N2581 Secondary hyperparathyroidism of renal origin: Secondary | ICD-10-CM | POA: Diagnosis not present

## 2018-01-29 DIAGNOSIS — N186 End stage renal disease: Secondary | ICD-10-CM | POA: Diagnosis not present

## 2018-01-29 NOTE — Telephone Encounter (Signed)
LABS REVIEWED Jan 02, 2018-Hb 9.1 MCV 92 PLT CT 195 Cr 10.18,TRANSFERRIN 22(20-55). REVIEWED-NO ADDITIONAL RECOMMENDATIONS. PT SHOULD CALL WITH QUESTIONS OR CONCERNS.

## 2018-01-31 DIAGNOSIS — N186 End stage renal disease: Secondary | ICD-10-CM | POA: Diagnosis not present

## 2018-01-31 DIAGNOSIS — D631 Anemia in chronic kidney disease: Secondary | ICD-10-CM | POA: Diagnosis not present

## 2018-01-31 DIAGNOSIS — N2581 Secondary hyperparathyroidism of renal origin: Secondary | ICD-10-CM | POA: Diagnosis not present

## 2018-01-31 DIAGNOSIS — E875 Hyperkalemia: Secondary | ICD-10-CM | POA: Diagnosis not present

## 2018-02-03 NOTE — Telephone Encounter (Signed)
Madison [name] left Vm for me to call. I called and could not leave a VM, VM full.

## 2018-02-04 DIAGNOSIS — Z79899 Other long term (current) drug therapy: Secondary | ICD-10-CM | POA: Diagnosis not present

## 2018-02-04 DIAGNOSIS — R52 Pain, unspecified: Secondary | ICD-10-CM | POA: Diagnosis not present

## 2018-02-04 DIAGNOSIS — T82868A Thrombosis of vascular prosthetic devices, implants and grafts, initial encounter: Secondary | ICD-10-CM | POA: Diagnosis not present

## 2018-02-04 NOTE — Telephone Encounter (Signed)
LMOM to call.

## 2018-02-05 DIAGNOSIS — D631 Anemia in chronic kidney disease: Secondary | ICD-10-CM | POA: Diagnosis not present

## 2018-02-05 DIAGNOSIS — N2581 Secondary hyperparathyroidism of renal origin: Secondary | ICD-10-CM | POA: Diagnosis not present

## 2018-02-05 DIAGNOSIS — N186 End stage renal disease: Secondary | ICD-10-CM | POA: Diagnosis not present

## 2018-02-05 DIAGNOSIS — E875 Hyperkalemia: Secondary | ICD-10-CM | POA: Diagnosis not present

## 2018-02-05 NOTE — Telephone Encounter (Signed)
Madison [name] is aware of lab results and will call if any problems.

## 2018-02-07 DIAGNOSIS — D631 Anemia in chronic kidney disease: Secondary | ICD-10-CM | POA: Diagnosis not present

## 2018-02-07 DIAGNOSIS — N2581 Secondary hyperparathyroidism of renal origin: Secondary | ICD-10-CM | POA: Diagnosis not present

## 2018-02-07 DIAGNOSIS — E875 Hyperkalemia: Secondary | ICD-10-CM | POA: Diagnosis not present

## 2018-02-07 DIAGNOSIS — N186 End stage renal disease: Secondary | ICD-10-CM | POA: Diagnosis not present

## 2018-02-10 DIAGNOSIS — N2581 Secondary hyperparathyroidism of renal origin: Secondary | ICD-10-CM | POA: Diagnosis not present

## 2018-02-10 DIAGNOSIS — E875 Hyperkalemia: Secondary | ICD-10-CM | POA: Diagnosis not present

## 2018-02-10 DIAGNOSIS — D631 Anemia in chronic kidney disease: Secondary | ICD-10-CM | POA: Diagnosis not present

## 2018-02-10 DIAGNOSIS — N186 End stage renal disease: Secondary | ICD-10-CM | POA: Diagnosis not present

## 2018-02-12 DIAGNOSIS — N2581 Secondary hyperparathyroidism of renal origin: Secondary | ICD-10-CM | POA: Diagnosis not present

## 2018-02-12 DIAGNOSIS — N186 End stage renal disease: Secondary | ICD-10-CM | POA: Diagnosis not present

## 2018-02-12 DIAGNOSIS — D631 Anemia in chronic kidney disease: Secondary | ICD-10-CM | POA: Diagnosis not present

## 2018-02-12 DIAGNOSIS — E875 Hyperkalemia: Secondary | ICD-10-CM | POA: Diagnosis not present

## 2018-02-14 DIAGNOSIS — N186 End stage renal disease: Secondary | ICD-10-CM | POA: Diagnosis not present

## 2018-02-14 DIAGNOSIS — E875 Hyperkalemia: Secondary | ICD-10-CM | POA: Diagnosis not present

## 2018-02-14 DIAGNOSIS — N2581 Secondary hyperparathyroidism of renal origin: Secondary | ICD-10-CM | POA: Diagnosis not present

## 2018-02-14 DIAGNOSIS — D631 Anemia in chronic kidney disease: Secondary | ICD-10-CM | POA: Diagnosis not present

## 2018-02-17 DIAGNOSIS — N2581 Secondary hyperparathyroidism of renal origin: Secondary | ICD-10-CM | POA: Diagnosis not present

## 2018-02-17 DIAGNOSIS — D631 Anemia in chronic kidney disease: Secondary | ICD-10-CM | POA: Diagnosis not present

## 2018-02-17 DIAGNOSIS — E875 Hyperkalemia: Secondary | ICD-10-CM | POA: Diagnosis not present

## 2018-02-17 DIAGNOSIS — N186 End stage renal disease: Secondary | ICD-10-CM | POA: Diagnosis not present

## 2018-02-19 DIAGNOSIS — E875 Hyperkalemia: Secondary | ICD-10-CM | POA: Diagnosis not present

## 2018-02-19 DIAGNOSIS — N2581 Secondary hyperparathyroidism of renal origin: Secondary | ICD-10-CM | POA: Diagnosis not present

## 2018-02-19 DIAGNOSIS — D631 Anemia in chronic kidney disease: Secondary | ICD-10-CM | POA: Diagnosis not present

## 2018-02-19 DIAGNOSIS — N186 End stage renal disease: Secondary | ICD-10-CM | POA: Diagnosis not present

## 2018-02-21 DIAGNOSIS — E875 Hyperkalemia: Secondary | ICD-10-CM | POA: Diagnosis not present

## 2018-02-21 DIAGNOSIS — N2581 Secondary hyperparathyroidism of renal origin: Secondary | ICD-10-CM | POA: Diagnosis not present

## 2018-02-21 DIAGNOSIS — N186 End stage renal disease: Secondary | ICD-10-CM | POA: Diagnosis not present

## 2018-02-21 DIAGNOSIS — Z992 Dependence on renal dialysis: Secondary | ICD-10-CM | POA: Diagnosis not present

## 2018-02-21 DIAGNOSIS — D631 Anemia in chronic kidney disease: Secondary | ICD-10-CM | POA: Diagnosis not present

## 2018-02-24 DIAGNOSIS — D631 Anemia in chronic kidney disease: Secondary | ICD-10-CM | POA: Diagnosis not present

## 2018-02-24 DIAGNOSIS — N2581 Secondary hyperparathyroidism of renal origin: Secondary | ICD-10-CM | POA: Diagnosis not present

## 2018-02-24 DIAGNOSIS — N186 End stage renal disease: Secondary | ICD-10-CM | POA: Diagnosis not present

## 2018-02-26 DIAGNOSIS — N2581 Secondary hyperparathyroidism of renal origin: Secondary | ICD-10-CM | POA: Diagnosis not present

## 2018-02-26 DIAGNOSIS — N186 End stage renal disease: Secondary | ICD-10-CM | POA: Diagnosis not present

## 2018-02-26 DIAGNOSIS — D631 Anemia in chronic kidney disease: Secondary | ICD-10-CM | POA: Diagnosis not present

## 2018-02-28 DIAGNOSIS — N2581 Secondary hyperparathyroidism of renal origin: Secondary | ICD-10-CM | POA: Diagnosis not present

## 2018-02-28 DIAGNOSIS — N186 End stage renal disease: Secondary | ICD-10-CM | POA: Diagnosis not present

## 2018-02-28 DIAGNOSIS — D631 Anemia in chronic kidney disease: Secondary | ICD-10-CM | POA: Diagnosis not present

## 2018-03-03 DIAGNOSIS — D631 Anemia in chronic kidney disease: Secondary | ICD-10-CM | POA: Diagnosis not present

## 2018-03-03 DIAGNOSIS — N186 End stage renal disease: Secondary | ICD-10-CM | POA: Diagnosis not present

## 2018-03-03 DIAGNOSIS — N2581 Secondary hyperparathyroidism of renal origin: Secondary | ICD-10-CM | POA: Diagnosis not present

## 2018-03-05 DIAGNOSIS — N2581 Secondary hyperparathyroidism of renal origin: Secondary | ICD-10-CM | POA: Diagnosis not present

## 2018-03-05 DIAGNOSIS — N186 End stage renal disease: Secondary | ICD-10-CM | POA: Diagnosis not present

## 2018-03-05 DIAGNOSIS — D631 Anemia in chronic kidney disease: Secondary | ICD-10-CM | POA: Diagnosis not present

## 2018-03-07 DIAGNOSIS — N186 End stage renal disease: Secondary | ICD-10-CM | POA: Diagnosis not present

## 2018-03-07 DIAGNOSIS — D631 Anemia in chronic kidney disease: Secondary | ICD-10-CM | POA: Diagnosis not present

## 2018-03-07 DIAGNOSIS — N2581 Secondary hyperparathyroidism of renal origin: Secondary | ICD-10-CM | POA: Diagnosis not present

## 2018-03-10 DIAGNOSIS — D631 Anemia in chronic kidney disease: Secondary | ICD-10-CM | POA: Diagnosis not present

## 2018-03-10 DIAGNOSIS — N186 End stage renal disease: Secondary | ICD-10-CM | POA: Diagnosis not present

## 2018-03-10 DIAGNOSIS — N2581 Secondary hyperparathyroidism of renal origin: Secondary | ICD-10-CM | POA: Diagnosis not present

## 2018-03-12 DIAGNOSIS — N2581 Secondary hyperparathyroidism of renal origin: Secondary | ICD-10-CM | POA: Diagnosis not present

## 2018-03-12 DIAGNOSIS — D631 Anemia in chronic kidney disease: Secondary | ICD-10-CM | POA: Diagnosis not present

## 2018-03-12 DIAGNOSIS — N186 End stage renal disease: Secondary | ICD-10-CM | POA: Diagnosis not present

## 2018-03-14 DIAGNOSIS — N186 End stage renal disease: Secondary | ICD-10-CM | POA: Diagnosis not present

## 2018-03-14 DIAGNOSIS — D631 Anemia in chronic kidney disease: Secondary | ICD-10-CM | POA: Diagnosis not present

## 2018-03-14 DIAGNOSIS — N2581 Secondary hyperparathyroidism of renal origin: Secondary | ICD-10-CM | POA: Diagnosis not present

## 2018-03-17 DIAGNOSIS — D631 Anemia in chronic kidney disease: Secondary | ICD-10-CM | POA: Diagnosis not present

## 2018-03-17 DIAGNOSIS — N2581 Secondary hyperparathyroidism of renal origin: Secondary | ICD-10-CM | POA: Diagnosis not present

## 2018-03-17 DIAGNOSIS — N186 End stage renal disease: Secondary | ICD-10-CM | POA: Diagnosis not present

## 2018-03-19 DIAGNOSIS — N2581 Secondary hyperparathyroidism of renal origin: Secondary | ICD-10-CM | POA: Diagnosis not present

## 2018-03-19 DIAGNOSIS — D631 Anemia in chronic kidney disease: Secondary | ICD-10-CM | POA: Diagnosis not present

## 2018-03-19 DIAGNOSIS — N186 End stage renal disease: Secondary | ICD-10-CM | POA: Diagnosis not present

## 2018-03-20 ENCOUNTER — Ambulatory Visit (INDEPENDENT_AMBULATORY_CARE_PROVIDER_SITE_OTHER): Payer: Medicare Other | Admitting: Gastroenterology

## 2018-03-20 ENCOUNTER — Encounter: Payer: Self-pay | Admitting: Gastroenterology

## 2018-03-20 VITALS — BP 98/60 | HR 80 | Temp 97.3°F | Ht 64.0 in | Wt 131.0 lb

## 2018-03-20 DIAGNOSIS — D649 Anemia, unspecified: Secondary | ICD-10-CM

## 2018-03-20 NOTE — Patient Instructions (Signed)
I will review your most recent labs done at dialysis and let you know if any additional labs needed.   Unless you develop need for blood transfusion or see obvious blood in your stool, we will simply continue to monitor you.  Return to the office in four months to see Dr. Oneida Alar.

## 2018-03-20 NOTE — Assessment & Plan Note (Signed)
Required blood transfusion last fall.  Prior to hemodialysis she was taking Procrit but this was stopped and she was started on oral iron therapy when she started dialysis last fall.  Hemoglobin and iron saturation has been fairly stable but most recent labs available were from April.  We will  request most recent anemia labs.  She will continue her iron pills.  We will have her follow-up in 4 months.  Unless she develops transfusion dependent anemia or overt GI bleeding, would continue to monitor for now.

## 2018-03-20 NOTE — Progress Notes (Signed)
Primary Care Physician: Lucianne Lei, MD  Primary Gastroenterologist:  Barney Drain, MD   Chief Complaint  Patient presents with  . Anemia    f/u. Capsule endo done 12/2017. Still having dark stools but was told due to iron pill she takes    HPI: Madison [name] is a 67 y.o. female presents to follow-up on anemia.  She has end-stage renal disease on hemodialysis.  EGD in March showed gastritis/duodenitis, moderate sized hiatal hernia.  No H. pylori.  Small bowel capsule endoscopy in April showed occasional benign-appearing polyp of the jejunum, no active bleeding.  Advised to continue iron, consider balloon enteroscopy if patient develops transfusion dependent anemia or evidence of active GI bleed (melena/bright red blood per rectum).  Last labs available from April 2019 with hemoglobin 9.1, hematocrit 27.3, MCV 92, iron saturations 22%.  January 2019 her hemoglobin was 9.9, hematocrit 29.7, MCV 96, iron saturations 20%.  In November 2018 her hemoglobin was 8.3, hematocrit 24.3.  At that time her iron saturations were 14%.  Patient states her stools are dark on iron.  Denies any fresh blood per rectum.  Bowel movements are regular.  Appetite fair.  Denies any significant abdominal pain.  Stools are loose at times but never has any issues while being dialyzed.  Sister present today.  She warns me of the fact that the patient is a difficult historian.  Patient concerned about her mobility.  Apparently she has had home health physical therapy prior to her starting hemodialysis last fall.  Although does it has been suggested that she follow-up with PCP regarding these concerns she seems overwhelmed that she is not able to do anything extra because of her dialysis.  Sister plans to contact PCP.  Last colonoscopy April 2017, 12 mm tubular adenoma removed from the transverse colon.  Next colonoscopy planned for April 2020.  Current Outpatient Medications  Medication Sig Dispense Refill  .  gabapentin (NEURONTIN) 300 MG capsule Take 300 mg by mouth at bedtime.     . mirtazapine (REMERON) 30 MG tablet Take 30 mg by mouth at bedtime.     . Multiple Vitamin (MULTIVITAMIN) tablet Take 1 tablet by mouth daily.    Marland Kitchen OLANZapine (ZYPREXA) 15 MG tablet Take 15 mg by mouth at bedtime.     . pantoprazole (PROTONIX) 40 MG tablet 1 PO 30 MINUTES PRIOR TO MEALS BID FOR 3 MOS THEN QD 60 tablet 11  . thiamine (VITAMIN B-1) 100 MG tablet Take 50 mg by mouth daily.     . ferric citrate (AURYXIA) 1 GM 210 MG(Fe) tablet Take 210 mg by mouth 3 (three) times daily with meals.      No current facility-administered medications for this visit.     Allergies as of 03/20/2018  . (No Known Allergies)    ROS:  General: Negative for anorexia, weight loss, fever, chills, fatigue, +++weakness.  Weight back at her baseline ENT: Negative for hoarseness, difficulty swallowing , nasal congestion. CV: Negative for chest pain, angina, palpitations, dyspnea on exertion, peripheral edema.  Respiratory: Negative for dyspnea at rest, dyspnea on exertion, cough, sputum, wheezing.  GI: See history of present illness. GU:  Negative for dysuria, hematuria, urinary incontinence, urinary frequency, nocturnal urination.  Endo: Negative for unusual weight change.    Physical Examination:   BP 98/60   Pulse 80   Temp (!) 97.3 F (36.3 C) (Oral)   Ht 5\' 4"  (1.626 m)   Wt 131 lb (59.4 kg)  BMI 22.49 kg/m   General: Well-nourished, well-developed in no acute distress.  Eyes: No icterus. Mouth: Oropharyngeal mucosa moist and pink , no lesions erythema or exudate. Lungs: Clear to auscultation bilaterally.  Heart: Regular rate and rhythm, no murmurs rubs or gallops.  Abdomen: Bowel sounds are normal, nontender, nondistended, no hepatosplenomegaly or masses, no abdominal bruits or hernia , no rebound or guarding.   Extremities: No lower extremity edema. No clubbing or deformities. Neuro: Alert and oriented x 4     Skin: Warm and dry, no jaundice.   Psych: Alert and cooperative, normal mood and affect.  Labs:  See HPI Imaging Studies: No results found.

## 2018-03-21 DIAGNOSIS — N2581 Secondary hyperparathyroidism of renal origin: Secondary | ICD-10-CM | POA: Diagnosis not present

## 2018-03-21 DIAGNOSIS — D631 Anemia in chronic kidney disease: Secondary | ICD-10-CM | POA: Diagnosis not present

## 2018-03-21 DIAGNOSIS — N186 End stage renal disease: Secondary | ICD-10-CM | POA: Diagnosis not present

## 2018-03-23 DIAGNOSIS — Z992 Dependence on renal dialysis: Secondary | ICD-10-CM | POA: Diagnosis not present

## 2018-03-23 DIAGNOSIS — N186 End stage renal disease: Secondary | ICD-10-CM | POA: Diagnosis not present

## 2018-03-24 DIAGNOSIS — N186 End stage renal disease: Secondary | ICD-10-CM | POA: Diagnosis not present

## 2018-03-24 DIAGNOSIS — E441 Mild protein-calorie malnutrition: Secondary | ICD-10-CM | POA: Diagnosis not present

## 2018-03-24 DIAGNOSIS — D631 Anemia in chronic kidney disease: Secondary | ICD-10-CM | POA: Diagnosis not present

## 2018-03-24 DIAGNOSIS — N2581 Secondary hyperparathyroidism of renal origin: Secondary | ICD-10-CM | POA: Diagnosis not present

## 2018-03-24 NOTE — Progress Notes (Signed)
CC'D TO PCP °

## 2018-03-25 DIAGNOSIS — F411 Generalized anxiety disorder: Secondary | ICD-10-CM | POA: Diagnosis not present

## 2018-03-25 DIAGNOSIS — F250 Schizoaffective disorder, bipolar type: Secondary | ICD-10-CM | POA: Diagnosis not present

## 2018-03-26 DIAGNOSIS — D631 Anemia in chronic kidney disease: Secondary | ICD-10-CM | POA: Diagnosis not present

## 2018-03-26 DIAGNOSIS — N2581 Secondary hyperparathyroidism of renal origin: Secondary | ICD-10-CM | POA: Diagnosis not present

## 2018-03-26 DIAGNOSIS — N186 End stage renal disease: Secondary | ICD-10-CM | POA: Diagnosis not present

## 2018-03-26 DIAGNOSIS — E441 Mild protein-calorie malnutrition: Secondary | ICD-10-CM | POA: Diagnosis not present

## 2018-03-28 DIAGNOSIS — E441 Mild protein-calorie malnutrition: Secondary | ICD-10-CM | POA: Diagnosis not present

## 2018-03-28 DIAGNOSIS — N2581 Secondary hyperparathyroidism of renal origin: Secondary | ICD-10-CM | POA: Diagnosis not present

## 2018-03-28 DIAGNOSIS — D631 Anemia in chronic kidney disease: Secondary | ICD-10-CM | POA: Diagnosis not present

## 2018-03-28 DIAGNOSIS — N186 End stage renal disease: Secondary | ICD-10-CM | POA: Diagnosis not present

## 2018-03-29 ENCOUNTER — Telehealth: Payer: Self-pay | Admitting: Gastroenterology

## 2018-03-29 NOTE — Telephone Encounter (Signed)
  Please let patient know we reviewed recent labs. Labs from 03/19/18:  Hgb 12.8.  Ov in four months as planned.

## 2018-03-31 DIAGNOSIS — N186 End stage renal disease: Secondary | ICD-10-CM | POA: Diagnosis not present

## 2018-03-31 DIAGNOSIS — D631 Anemia in chronic kidney disease: Secondary | ICD-10-CM | POA: Diagnosis not present

## 2018-03-31 DIAGNOSIS — N2581 Secondary hyperparathyroidism of renal origin: Secondary | ICD-10-CM | POA: Diagnosis not present

## 2018-03-31 DIAGNOSIS — E441 Mild protein-calorie malnutrition: Secondary | ICD-10-CM | POA: Diagnosis not present

## 2018-03-31 NOTE — Telephone Encounter (Signed)
Madison [name] is aware.

## 2018-04-02 DIAGNOSIS — D631 Anemia in chronic kidney disease: Secondary | ICD-10-CM | POA: Diagnosis not present

## 2018-04-02 DIAGNOSIS — N186 End stage renal disease: Secondary | ICD-10-CM | POA: Diagnosis not present

## 2018-04-02 DIAGNOSIS — E441 Mild protein-calorie malnutrition: Secondary | ICD-10-CM | POA: Diagnosis not present

## 2018-04-02 DIAGNOSIS — N251 Nephrogenic diabetes insipidus: Secondary | ICD-10-CM | POA: Diagnosis not present

## 2018-04-02 DIAGNOSIS — N2581 Secondary hyperparathyroidism of renal origin: Secondary | ICD-10-CM | POA: Diagnosis not present

## 2018-04-04 DIAGNOSIS — N2581 Secondary hyperparathyroidism of renal origin: Secondary | ICD-10-CM | POA: Diagnosis not present

## 2018-04-04 DIAGNOSIS — D631 Anemia in chronic kidney disease: Secondary | ICD-10-CM | POA: Diagnosis not present

## 2018-04-04 DIAGNOSIS — N186 End stage renal disease: Secondary | ICD-10-CM | POA: Diagnosis not present

## 2018-04-04 DIAGNOSIS — E441 Mild protein-calorie malnutrition: Secondary | ICD-10-CM | POA: Diagnosis not present

## 2018-04-07 DIAGNOSIS — N2581 Secondary hyperparathyroidism of renal origin: Secondary | ICD-10-CM | POA: Diagnosis not present

## 2018-04-07 DIAGNOSIS — N186 End stage renal disease: Secondary | ICD-10-CM | POA: Diagnosis not present

## 2018-04-07 DIAGNOSIS — E441 Mild protein-calorie malnutrition: Secondary | ICD-10-CM | POA: Diagnosis not present

## 2018-04-07 DIAGNOSIS — D631 Anemia in chronic kidney disease: Secondary | ICD-10-CM | POA: Diagnosis not present

## 2018-04-09 DIAGNOSIS — N2581 Secondary hyperparathyroidism of renal origin: Secondary | ICD-10-CM | POA: Diagnosis not present

## 2018-04-09 DIAGNOSIS — D631 Anemia in chronic kidney disease: Secondary | ICD-10-CM | POA: Diagnosis not present

## 2018-04-09 DIAGNOSIS — E441 Mild protein-calorie malnutrition: Secondary | ICD-10-CM | POA: Diagnosis not present

## 2018-04-09 DIAGNOSIS — N186 End stage renal disease: Secondary | ICD-10-CM | POA: Diagnosis not present

## 2018-04-11 DIAGNOSIS — D631 Anemia in chronic kidney disease: Secondary | ICD-10-CM | POA: Diagnosis not present

## 2018-04-11 DIAGNOSIS — N2581 Secondary hyperparathyroidism of renal origin: Secondary | ICD-10-CM | POA: Diagnosis not present

## 2018-04-11 DIAGNOSIS — E441 Mild protein-calorie malnutrition: Secondary | ICD-10-CM | POA: Diagnosis not present

## 2018-04-11 DIAGNOSIS — N186 End stage renal disease: Secondary | ICD-10-CM | POA: Diagnosis not present

## 2018-04-14 DIAGNOSIS — N186 End stage renal disease: Secondary | ICD-10-CM | POA: Diagnosis not present

## 2018-04-14 DIAGNOSIS — N2581 Secondary hyperparathyroidism of renal origin: Secondary | ICD-10-CM | POA: Diagnosis not present

## 2018-04-14 DIAGNOSIS — D631 Anemia in chronic kidney disease: Secondary | ICD-10-CM | POA: Diagnosis not present

## 2018-04-14 DIAGNOSIS — E441 Mild protein-calorie malnutrition: Secondary | ICD-10-CM | POA: Diagnosis not present

## 2018-04-16 DIAGNOSIS — E441 Mild protein-calorie malnutrition: Secondary | ICD-10-CM | POA: Diagnosis not present

## 2018-04-16 DIAGNOSIS — N2581 Secondary hyperparathyroidism of renal origin: Secondary | ICD-10-CM | POA: Diagnosis not present

## 2018-04-16 DIAGNOSIS — N186 End stage renal disease: Secondary | ICD-10-CM | POA: Diagnosis not present

## 2018-04-16 DIAGNOSIS — D631 Anemia in chronic kidney disease: Secondary | ICD-10-CM | POA: Diagnosis not present

## 2018-04-18 DIAGNOSIS — D631 Anemia in chronic kidney disease: Secondary | ICD-10-CM | POA: Diagnosis not present

## 2018-04-18 DIAGNOSIS — N2581 Secondary hyperparathyroidism of renal origin: Secondary | ICD-10-CM | POA: Diagnosis not present

## 2018-04-18 DIAGNOSIS — E441 Mild protein-calorie malnutrition: Secondary | ICD-10-CM | POA: Diagnosis not present

## 2018-04-18 DIAGNOSIS — N186 End stage renal disease: Secondary | ICD-10-CM | POA: Diagnosis not present

## 2018-04-21 DIAGNOSIS — N2581 Secondary hyperparathyroidism of renal origin: Secondary | ICD-10-CM | POA: Diagnosis not present

## 2018-04-21 DIAGNOSIS — E441 Mild protein-calorie malnutrition: Secondary | ICD-10-CM | POA: Diagnosis not present

## 2018-04-21 DIAGNOSIS — N186 End stage renal disease: Secondary | ICD-10-CM | POA: Diagnosis not present

## 2018-04-21 DIAGNOSIS — D631 Anemia in chronic kidney disease: Secondary | ICD-10-CM | POA: Diagnosis not present

## 2018-04-23 DIAGNOSIS — N186 End stage renal disease: Secondary | ICD-10-CM | POA: Diagnosis not present

## 2018-04-23 DIAGNOSIS — N2581 Secondary hyperparathyroidism of renal origin: Secondary | ICD-10-CM | POA: Diagnosis not present

## 2018-04-23 DIAGNOSIS — Z992 Dependence on renal dialysis: Secondary | ICD-10-CM | POA: Diagnosis not present

## 2018-04-23 DIAGNOSIS — E441 Mild protein-calorie malnutrition: Secondary | ICD-10-CM | POA: Diagnosis not present

## 2018-04-23 DIAGNOSIS — D631 Anemia in chronic kidney disease: Secondary | ICD-10-CM | POA: Diagnosis not present

## 2018-04-25 ENCOUNTER — Other Ambulatory Visit: Payer: Self-pay | Admitting: Family Medicine

## 2018-04-25 DIAGNOSIS — Z1231 Encounter for screening mammogram for malignant neoplasm of breast: Secondary | ICD-10-CM

## 2018-04-25 DIAGNOSIS — N186 End stage renal disease: Secondary | ICD-10-CM | POA: Diagnosis not present

## 2018-04-25 DIAGNOSIS — D631 Anemia in chronic kidney disease: Secondary | ICD-10-CM | POA: Diagnosis not present

## 2018-04-25 DIAGNOSIS — N2581 Secondary hyperparathyroidism of renal origin: Secondary | ICD-10-CM | POA: Diagnosis not present

## 2018-04-28 DIAGNOSIS — N2581 Secondary hyperparathyroidism of renal origin: Secondary | ICD-10-CM | POA: Diagnosis not present

## 2018-04-28 DIAGNOSIS — N186 End stage renal disease: Secondary | ICD-10-CM | POA: Diagnosis not present

## 2018-04-28 DIAGNOSIS — D631 Anemia in chronic kidney disease: Secondary | ICD-10-CM | POA: Diagnosis not present

## 2018-04-30 DIAGNOSIS — N2581 Secondary hyperparathyroidism of renal origin: Secondary | ICD-10-CM | POA: Diagnosis not present

## 2018-04-30 DIAGNOSIS — N186 End stage renal disease: Secondary | ICD-10-CM | POA: Diagnosis not present

## 2018-04-30 DIAGNOSIS — D631 Anemia in chronic kidney disease: Secondary | ICD-10-CM | POA: Diagnosis not present

## 2018-05-01 ENCOUNTER — Other Ambulatory Visit: Payer: Self-pay | Admitting: Family Medicine

## 2018-05-01 DIAGNOSIS — F064 Anxiety disorder due to known physiological condition: Secondary | ICD-10-CM | POA: Diagnosis not present

## 2018-05-01 DIAGNOSIS — E2839 Other primary ovarian failure: Secondary | ICD-10-CM

## 2018-05-02 DIAGNOSIS — N186 End stage renal disease: Secondary | ICD-10-CM | POA: Diagnosis not present

## 2018-05-02 DIAGNOSIS — D631 Anemia in chronic kidney disease: Secondary | ICD-10-CM | POA: Diagnosis not present

## 2018-05-02 DIAGNOSIS — N2581 Secondary hyperparathyroidism of renal origin: Secondary | ICD-10-CM | POA: Diagnosis not present

## 2018-05-05 DIAGNOSIS — N2581 Secondary hyperparathyroidism of renal origin: Secondary | ICD-10-CM | POA: Diagnosis not present

## 2018-05-05 DIAGNOSIS — D631 Anemia in chronic kidney disease: Secondary | ICD-10-CM | POA: Diagnosis not present

## 2018-05-05 DIAGNOSIS — N186 End stage renal disease: Secondary | ICD-10-CM | POA: Diagnosis not present

## 2018-05-07 DIAGNOSIS — D631 Anemia in chronic kidney disease: Secondary | ICD-10-CM | POA: Diagnosis not present

## 2018-05-07 DIAGNOSIS — N186 End stage renal disease: Secondary | ICD-10-CM | POA: Diagnosis not present

## 2018-05-07 DIAGNOSIS — N2581 Secondary hyperparathyroidism of renal origin: Secondary | ICD-10-CM | POA: Diagnosis not present

## 2018-05-09 DIAGNOSIS — D631 Anemia in chronic kidney disease: Secondary | ICD-10-CM | POA: Diagnosis not present

## 2018-05-09 DIAGNOSIS — N186 End stage renal disease: Secondary | ICD-10-CM | POA: Diagnosis not present

## 2018-05-09 DIAGNOSIS — N2581 Secondary hyperparathyroidism of renal origin: Secondary | ICD-10-CM | POA: Diagnosis not present

## 2018-05-12 DIAGNOSIS — N2581 Secondary hyperparathyroidism of renal origin: Secondary | ICD-10-CM | POA: Diagnosis not present

## 2018-05-12 DIAGNOSIS — D631 Anemia in chronic kidney disease: Secondary | ICD-10-CM | POA: Diagnosis not present

## 2018-05-12 DIAGNOSIS — N186 End stage renal disease: Secondary | ICD-10-CM | POA: Diagnosis not present

## 2018-05-14 DIAGNOSIS — D631 Anemia in chronic kidney disease: Secondary | ICD-10-CM | POA: Diagnosis not present

## 2018-05-14 DIAGNOSIS — N2581 Secondary hyperparathyroidism of renal origin: Secondary | ICD-10-CM | POA: Diagnosis not present

## 2018-05-14 DIAGNOSIS — N186 End stage renal disease: Secondary | ICD-10-CM | POA: Diagnosis not present

## 2018-05-16 DIAGNOSIS — N2581 Secondary hyperparathyroidism of renal origin: Secondary | ICD-10-CM | POA: Diagnosis not present

## 2018-05-16 DIAGNOSIS — N186 End stage renal disease: Secondary | ICD-10-CM | POA: Diagnosis not present

## 2018-05-16 DIAGNOSIS — D631 Anemia in chronic kidney disease: Secondary | ICD-10-CM | POA: Diagnosis not present

## 2018-05-19 DIAGNOSIS — N2581 Secondary hyperparathyroidism of renal origin: Secondary | ICD-10-CM | POA: Diagnosis not present

## 2018-05-19 DIAGNOSIS — D631 Anemia in chronic kidney disease: Secondary | ICD-10-CM | POA: Diagnosis not present

## 2018-05-19 DIAGNOSIS — N186 End stage renal disease: Secondary | ICD-10-CM | POA: Diagnosis not present

## 2018-05-20 ENCOUNTER — Ambulatory Visit
Admission: RE | Admit: 2018-05-20 | Discharge: 2018-05-20 | Disposition: A | Discharge status: Home / Self Care | Payer: Medicare Other | Source: Ambulatory Visit | Attending: Family Medicine | Admitting: Family Medicine

## 2018-05-20 DIAGNOSIS — Z1231 Encounter for screening mammogram for malignant neoplasm of breast: Secondary | ICD-10-CM | POA: Diagnosis not present

## 2018-05-21 DIAGNOSIS — N2581 Secondary hyperparathyroidism of renal origin: Secondary | ICD-10-CM | POA: Diagnosis not present

## 2018-05-21 DIAGNOSIS — D631 Anemia in chronic kidney disease: Secondary | ICD-10-CM | POA: Diagnosis not present

## 2018-05-21 DIAGNOSIS — N186 End stage renal disease: Secondary | ICD-10-CM | POA: Diagnosis not present

## 2018-05-22 ENCOUNTER — Other Ambulatory Visit: Payer: Self-pay | Admitting: Family Medicine

## 2018-05-22 DIAGNOSIS — R928 Other abnormal and inconclusive findings on diagnostic imaging of breast: Secondary | ICD-10-CM

## 2018-05-23 DIAGNOSIS — N2581 Secondary hyperparathyroidism of renal origin: Secondary | ICD-10-CM | POA: Diagnosis not present

## 2018-05-23 DIAGNOSIS — D631 Anemia in chronic kidney disease: Secondary | ICD-10-CM | POA: Diagnosis not present

## 2018-05-23 DIAGNOSIS — N186 End stage renal disease: Secondary | ICD-10-CM | POA: Diagnosis not present

## 2018-05-24 DIAGNOSIS — N186 End stage renal disease: Secondary | ICD-10-CM | POA: Diagnosis not present

## 2018-05-24 DIAGNOSIS — Z992 Dependence on renal dialysis: Secondary | ICD-10-CM | POA: Diagnosis not present

## 2018-05-26 DIAGNOSIS — Z23 Encounter for immunization: Secondary | ICD-10-CM | POA: Diagnosis not present

## 2018-05-26 DIAGNOSIS — N186 End stage renal disease: Secondary | ICD-10-CM | POA: Diagnosis not present

## 2018-05-26 DIAGNOSIS — D631 Anemia in chronic kidney disease: Secondary | ICD-10-CM | POA: Diagnosis not present

## 2018-05-26 DIAGNOSIS — N2581 Secondary hyperparathyroidism of renal origin: Secondary | ICD-10-CM | POA: Diagnosis not present

## 2018-05-27 ENCOUNTER — Other Ambulatory Visit: Payer: Medicare Other

## 2018-05-27 ENCOUNTER — Ambulatory Visit
Admission: RE | Admit: 2018-05-27 | Discharge: 2018-05-27 | Disposition: A | Discharge status: Home / Self Care | Payer: Medicare Other | Source: Ambulatory Visit | Attending: Family Medicine | Admitting: Family Medicine

## 2018-05-27 DIAGNOSIS — R922 Inconclusive mammogram: Secondary | ICD-10-CM | POA: Diagnosis not present

## 2018-05-27 DIAGNOSIS — R928 Other abnormal and inconclusive findings on diagnostic imaging of breast: Secondary | ICD-10-CM

## 2018-05-27 DIAGNOSIS — N6489 Other specified disorders of breast: Secondary | ICD-10-CM | POA: Diagnosis not present

## 2018-05-28 DIAGNOSIS — Z23 Encounter for immunization: Secondary | ICD-10-CM | POA: Diagnosis not present

## 2018-05-28 DIAGNOSIS — D631 Anemia in chronic kidney disease: Secondary | ICD-10-CM | POA: Diagnosis not present

## 2018-05-28 DIAGNOSIS — N186 End stage renal disease: Secondary | ICD-10-CM | POA: Diagnosis not present

## 2018-05-28 DIAGNOSIS — N2581 Secondary hyperparathyroidism of renal origin: Secondary | ICD-10-CM | POA: Diagnosis not present

## 2018-05-30 DIAGNOSIS — N2581 Secondary hyperparathyroidism of renal origin: Secondary | ICD-10-CM | POA: Diagnosis not present

## 2018-05-30 DIAGNOSIS — D631 Anemia in chronic kidney disease: Secondary | ICD-10-CM | POA: Diagnosis not present

## 2018-05-30 DIAGNOSIS — Z23 Encounter for immunization: Secondary | ICD-10-CM | POA: Diagnosis not present

## 2018-05-30 DIAGNOSIS — N186 End stage renal disease: Secondary | ICD-10-CM | POA: Diagnosis not present

## 2018-06-02 DIAGNOSIS — Z23 Encounter for immunization: Secondary | ICD-10-CM | POA: Diagnosis not present

## 2018-06-02 DIAGNOSIS — N186 End stage renal disease: Secondary | ICD-10-CM | POA: Diagnosis not present

## 2018-06-02 DIAGNOSIS — N2581 Secondary hyperparathyroidism of renal origin: Secondary | ICD-10-CM | POA: Diagnosis not present

## 2018-06-02 DIAGNOSIS — D631 Anemia in chronic kidney disease: Secondary | ICD-10-CM | POA: Diagnosis not present

## 2018-06-04 DIAGNOSIS — D631 Anemia in chronic kidney disease: Secondary | ICD-10-CM | POA: Diagnosis not present

## 2018-06-04 DIAGNOSIS — Z23 Encounter for immunization: Secondary | ICD-10-CM | POA: Diagnosis not present

## 2018-06-04 DIAGNOSIS — N2581 Secondary hyperparathyroidism of renal origin: Secondary | ICD-10-CM | POA: Diagnosis not present

## 2018-06-04 DIAGNOSIS — N186 End stage renal disease: Secondary | ICD-10-CM | POA: Diagnosis not present

## 2018-06-06 DIAGNOSIS — N2581 Secondary hyperparathyroidism of renal origin: Secondary | ICD-10-CM | POA: Diagnosis not present

## 2018-06-06 DIAGNOSIS — N186 End stage renal disease: Secondary | ICD-10-CM | POA: Diagnosis not present

## 2018-06-06 DIAGNOSIS — Z23 Encounter for immunization: Secondary | ICD-10-CM | POA: Diagnosis not present

## 2018-06-06 DIAGNOSIS — D631 Anemia in chronic kidney disease: Secondary | ICD-10-CM | POA: Diagnosis not present

## 2018-06-09 DIAGNOSIS — D631 Anemia in chronic kidney disease: Secondary | ICD-10-CM | POA: Diagnosis not present

## 2018-06-09 DIAGNOSIS — N186 End stage renal disease: Secondary | ICD-10-CM | POA: Diagnosis not present

## 2018-06-09 DIAGNOSIS — Z23 Encounter for immunization: Secondary | ICD-10-CM | POA: Diagnosis not present

## 2018-06-09 DIAGNOSIS — N2581 Secondary hyperparathyroidism of renal origin: Secondary | ICD-10-CM | POA: Diagnosis not present

## 2018-06-11 DIAGNOSIS — Z23 Encounter for immunization: Secondary | ICD-10-CM | POA: Diagnosis not present

## 2018-06-11 DIAGNOSIS — N186 End stage renal disease: Secondary | ICD-10-CM | POA: Diagnosis not present

## 2018-06-11 DIAGNOSIS — D631 Anemia in chronic kidney disease: Secondary | ICD-10-CM | POA: Diagnosis not present

## 2018-06-11 DIAGNOSIS — N2581 Secondary hyperparathyroidism of renal origin: Secondary | ICD-10-CM | POA: Diagnosis not present

## 2018-06-13 DIAGNOSIS — N186 End stage renal disease: Secondary | ICD-10-CM | POA: Diagnosis not present

## 2018-06-13 DIAGNOSIS — D631 Anemia in chronic kidney disease: Secondary | ICD-10-CM | POA: Diagnosis not present

## 2018-06-13 DIAGNOSIS — Z23 Encounter for immunization: Secondary | ICD-10-CM | POA: Diagnosis not present

## 2018-06-13 DIAGNOSIS — N2581 Secondary hyperparathyroidism of renal origin: Secondary | ICD-10-CM | POA: Diagnosis not present

## 2018-06-16 DIAGNOSIS — N186 End stage renal disease: Secondary | ICD-10-CM | POA: Diagnosis not present

## 2018-06-16 DIAGNOSIS — Z23 Encounter for immunization: Secondary | ICD-10-CM | POA: Diagnosis not present

## 2018-06-16 DIAGNOSIS — D631 Anemia in chronic kidney disease: Secondary | ICD-10-CM | POA: Diagnosis not present

## 2018-06-16 DIAGNOSIS — N2581 Secondary hyperparathyroidism of renal origin: Secondary | ICD-10-CM | POA: Diagnosis not present

## 2018-06-18 ENCOUNTER — Other Ambulatory Visit: Payer: Self-pay

## 2018-06-18 DIAGNOSIS — N2581 Secondary hyperparathyroidism of renal origin: Secondary | ICD-10-CM | POA: Diagnosis not present

## 2018-06-18 DIAGNOSIS — D631 Anemia in chronic kidney disease: Secondary | ICD-10-CM | POA: Diagnosis not present

## 2018-06-18 DIAGNOSIS — N186 End stage renal disease: Secondary | ICD-10-CM | POA: Diagnosis not present

## 2018-06-18 DIAGNOSIS — Z23 Encounter for immunization: Secondary | ICD-10-CM | POA: Diagnosis not present

## 2018-06-19 DIAGNOSIS — F411 Generalized anxiety disorder: Secondary | ICD-10-CM | POA: Diagnosis not present

## 2018-06-19 DIAGNOSIS — F250 Schizoaffective disorder, bipolar type: Secondary | ICD-10-CM | POA: Diagnosis not present

## 2018-06-20 DIAGNOSIS — N2581 Secondary hyperparathyroidism of renal origin: Secondary | ICD-10-CM | POA: Diagnosis not present

## 2018-06-20 DIAGNOSIS — Z23 Encounter for immunization: Secondary | ICD-10-CM | POA: Diagnosis not present

## 2018-06-20 DIAGNOSIS — N186 End stage renal disease: Secondary | ICD-10-CM | POA: Diagnosis not present

## 2018-06-20 DIAGNOSIS — D631 Anemia in chronic kidney disease: Secondary | ICD-10-CM | POA: Diagnosis not present

## 2018-06-23 DIAGNOSIS — N2581 Secondary hyperparathyroidism of renal origin: Secondary | ICD-10-CM | POA: Diagnosis not present

## 2018-06-23 DIAGNOSIS — Z23 Encounter for immunization: Secondary | ICD-10-CM | POA: Diagnosis not present

## 2018-06-23 DIAGNOSIS — Z992 Dependence on renal dialysis: Secondary | ICD-10-CM | POA: Diagnosis not present

## 2018-06-23 DIAGNOSIS — N186 End stage renal disease: Secondary | ICD-10-CM | POA: Diagnosis not present

## 2018-06-23 DIAGNOSIS — D631 Anemia in chronic kidney disease: Secondary | ICD-10-CM | POA: Diagnosis not present

## 2018-06-25 ENCOUNTER — Ambulatory Visit: Payer: Self-pay | Admitting: Gastroenterology

## 2018-06-25 DIAGNOSIS — N2581 Secondary hyperparathyroidism of renal origin: Secondary | ICD-10-CM | POA: Diagnosis not present

## 2018-06-25 DIAGNOSIS — N186 End stage renal disease: Secondary | ICD-10-CM | POA: Diagnosis not present

## 2018-06-25 DIAGNOSIS — E875 Hyperkalemia: Secondary | ICD-10-CM | POA: Diagnosis not present

## 2018-06-27 DIAGNOSIS — N186 End stage renal disease: Secondary | ICD-10-CM | POA: Diagnosis not present

## 2018-06-27 DIAGNOSIS — N2581 Secondary hyperparathyroidism of renal origin: Secondary | ICD-10-CM | POA: Diagnosis not present

## 2018-06-27 DIAGNOSIS — E875 Hyperkalemia: Secondary | ICD-10-CM | POA: Diagnosis not present

## 2018-06-30 DIAGNOSIS — N2581 Secondary hyperparathyroidism of renal origin: Secondary | ICD-10-CM | POA: Diagnosis not present

## 2018-06-30 DIAGNOSIS — N186 End stage renal disease: Secondary | ICD-10-CM | POA: Diagnosis not present

## 2018-06-30 DIAGNOSIS — E875 Hyperkalemia: Secondary | ICD-10-CM | POA: Diagnosis not present

## 2018-07-02 ENCOUNTER — Ambulatory Visit: Payer: Medicare Other | Admitting: Gastroenterology

## 2018-07-02 DIAGNOSIS — N2581 Secondary hyperparathyroidism of renal origin: Secondary | ICD-10-CM | POA: Diagnosis not present

## 2018-07-02 DIAGNOSIS — E875 Hyperkalemia: Secondary | ICD-10-CM | POA: Diagnosis not present

## 2018-07-02 DIAGNOSIS — N186 End stage renal disease: Secondary | ICD-10-CM | POA: Diagnosis not present

## 2018-07-04 DIAGNOSIS — N186 End stage renal disease: Secondary | ICD-10-CM | POA: Diagnosis not present

## 2018-07-04 DIAGNOSIS — N2581 Secondary hyperparathyroidism of renal origin: Secondary | ICD-10-CM | POA: Diagnosis not present

## 2018-07-04 DIAGNOSIS — E875 Hyperkalemia: Secondary | ICD-10-CM | POA: Diagnosis not present

## 2018-07-07 DIAGNOSIS — E875 Hyperkalemia: Secondary | ICD-10-CM | POA: Diagnosis not present

## 2018-07-07 DIAGNOSIS — N186 End stage renal disease: Secondary | ICD-10-CM | POA: Diagnosis not present

## 2018-07-07 DIAGNOSIS — N2581 Secondary hyperparathyroidism of renal origin: Secondary | ICD-10-CM | POA: Diagnosis not present

## 2018-07-09 DIAGNOSIS — N2581 Secondary hyperparathyroidism of renal origin: Secondary | ICD-10-CM | POA: Diagnosis not present

## 2018-07-09 DIAGNOSIS — E875 Hyperkalemia: Secondary | ICD-10-CM | POA: Diagnosis not present

## 2018-07-09 DIAGNOSIS — N186 End stage renal disease: Secondary | ICD-10-CM | POA: Diagnosis not present

## 2018-07-09 DIAGNOSIS — N251 Nephrogenic diabetes insipidus: Secondary | ICD-10-CM | POA: Diagnosis not present

## 2018-07-11 DIAGNOSIS — N2581 Secondary hyperparathyroidism of renal origin: Secondary | ICD-10-CM | POA: Diagnosis not present

## 2018-07-11 DIAGNOSIS — E875 Hyperkalemia: Secondary | ICD-10-CM | POA: Diagnosis not present

## 2018-07-11 DIAGNOSIS — N186 End stage renal disease: Secondary | ICD-10-CM | POA: Diagnosis not present

## 2018-07-14 DIAGNOSIS — E875 Hyperkalemia: Secondary | ICD-10-CM | POA: Diagnosis not present

## 2018-07-14 DIAGNOSIS — N2581 Secondary hyperparathyroidism of renal origin: Secondary | ICD-10-CM | POA: Diagnosis not present

## 2018-07-14 DIAGNOSIS — N186 End stage renal disease: Secondary | ICD-10-CM | POA: Diagnosis not present

## 2018-07-15 ENCOUNTER — Inpatient Hospital Stay
Admission: RE | Admit: 2018-07-15 | Discharge: 2018-07-15 | Disposition: A | Discharge status: Home / Self Care | Payer: Medicare Other | Source: Ambulatory Visit | Attending: Family Medicine | Admitting: Family Medicine

## 2018-07-16 DIAGNOSIS — N2581 Secondary hyperparathyroidism of renal origin: Secondary | ICD-10-CM | POA: Diagnosis not present

## 2018-07-16 DIAGNOSIS — E875 Hyperkalemia: Secondary | ICD-10-CM | POA: Diagnosis not present

## 2018-07-16 DIAGNOSIS — N186 End stage renal disease: Secondary | ICD-10-CM | POA: Diagnosis not present

## 2018-07-18 DIAGNOSIS — N2581 Secondary hyperparathyroidism of renal origin: Secondary | ICD-10-CM | POA: Diagnosis not present

## 2018-07-18 DIAGNOSIS — E875 Hyperkalemia: Secondary | ICD-10-CM | POA: Diagnosis not present

## 2018-07-18 DIAGNOSIS — N186 End stage renal disease: Secondary | ICD-10-CM | POA: Diagnosis not present

## 2018-07-21 DIAGNOSIS — N186 End stage renal disease: Secondary | ICD-10-CM | POA: Diagnosis not present

## 2018-07-21 DIAGNOSIS — E875 Hyperkalemia: Secondary | ICD-10-CM | POA: Diagnosis not present

## 2018-07-21 DIAGNOSIS — N2581 Secondary hyperparathyroidism of renal origin: Secondary | ICD-10-CM | POA: Diagnosis not present

## 2018-07-23 DIAGNOSIS — N2581 Secondary hyperparathyroidism of renal origin: Secondary | ICD-10-CM | POA: Diagnosis not present

## 2018-07-23 DIAGNOSIS — N186 End stage renal disease: Secondary | ICD-10-CM | POA: Diagnosis not present

## 2018-07-23 DIAGNOSIS — E875 Hyperkalemia: Secondary | ICD-10-CM | POA: Diagnosis not present

## 2018-07-24 DIAGNOSIS — N186 End stage renal disease: Secondary | ICD-10-CM | POA: Diagnosis not present

## 2018-07-24 DIAGNOSIS — Z992 Dependence on renal dialysis: Secondary | ICD-10-CM | POA: Diagnosis not present

## 2018-07-25 DIAGNOSIS — E875 Hyperkalemia: Secondary | ICD-10-CM | POA: Diagnosis not present

## 2018-07-25 DIAGNOSIS — N2581 Secondary hyperparathyroidism of renal origin: Secondary | ICD-10-CM | POA: Diagnosis not present

## 2018-07-25 DIAGNOSIS — N186 End stage renal disease: Secondary | ICD-10-CM | POA: Diagnosis not present

## 2018-07-25 DIAGNOSIS — D631 Anemia in chronic kidney disease: Secondary | ICD-10-CM | POA: Diagnosis not present

## 2018-07-28 DIAGNOSIS — N2581 Secondary hyperparathyroidism of renal origin: Secondary | ICD-10-CM | POA: Diagnosis not present

## 2018-07-28 DIAGNOSIS — E875 Hyperkalemia: Secondary | ICD-10-CM | POA: Diagnosis not present

## 2018-07-28 DIAGNOSIS — D631 Anemia in chronic kidney disease: Secondary | ICD-10-CM | POA: Diagnosis not present

## 2018-07-28 DIAGNOSIS — N186 End stage renal disease: Secondary | ICD-10-CM | POA: Diagnosis not present

## 2018-07-30 DIAGNOSIS — N186 End stage renal disease: Secondary | ICD-10-CM | POA: Diagnosis not present

## 2018-07-30 DIAGNOSIS — E875 Hyperkalemia: Secondary | ICD-10-CM | POA: Diagnosis not present

## 2018-07-30 DIAGNOSIS — N2581 Secondary hyperparathyroidism of renal origin: Secondary | ICD-10-CM | POA: Diagnosis not present

## 2018-07-30 DIAGNOSIS — D631 Anemia in chronic kidney disease: Secondary | ICD-10-CM | POA: Diagnosis not present

## 2018-08-01 DIAGNOSIS — N2581 Secondary hyperparathyroidism of renal origin: Secondary | ICD-10-CM | POA: Diagnosis not present

## 2018-08-01 DIAGNOSIS — N186 End stage renal disease: Secondary | ICD-10-CM | POA: Diagnosis not present

## 2018-08-01 DIAGNOSIS — E875 Hyperkalemia: Secondary | ICD-10-CM | POA: Diagnosis not present

## 2018-08-01 DIAGNOSIS — D631 Anemia in chronic kidney disease: Secondary | ICD-10-CM | POA: Diagnosis not present

## 2018-08-04 DIAGNOSIS — D631 Anemia in chronic kidney disease: Secondary | ICD-10-CM | POA: Diagnosis not present

## 2018-08-04 DIAGNOSIS — N186 End stage renal disease: Secondary | ICD-10-CM | POA: Diagnosis not present

## 2018-08-04 DIAGNOSIS — N2581 Secondary hyperparathyroidism of renal origin: Secondary | ICD-10-CM | POA: Diagnosis not present

## 2018-08-04 DIAGNOSIS — E875 Hyperkalemia: Secondary | ICD-10-CM | POA: Diagnosis not present

## 2018-08-06 DIAGNOSIS — E875 Hyperkalemia: Secondary | ICD-10-CM | POA: Diagnosis not present

## 2018-08-06 DIAGNOSIS — N186 End stage renal disease: Secondary | ICD-10-CM | POA: Diagnosis not present

## 2018-08-06 DIAGNOSIS — N2581 Secondary hyperparathyroidism of renal origin: Secondary | ICD-10-CM | POA: Diagnosis not present

## 2018-08-06 DIAGNOSIS — D631 Anemia in chronic kidney disease: Secondary | ICD-10-CM | POA: Diagnosis not present

## 2018-08-07 DIAGNOSIS — T82858A Stenosis of vascular prosthetic devices, implants and grafts, initial encounter: Secondary | ICD-10-CM | POA: Diagnosis not present

## 2018-08-07 DIAGNOSIS — T82898A Other specified complication of vascular prosthetic devices, implants and grafts, initial encounter: Secondary | ICD-10-CM | POA: Diagnosis not present

## 2018-08-08 DIAGNOSIS — N186 End stage renal disease: Secondary | ICD-10-CM | POA: Diagnosis not present

## 2018-08-08 DIAGNOSIS — E875 Hyperkalemia: Secondary | ICD-10-CM | POA: Diagnosis not present

## 2018-08-08 DIAGNOSIS — D631 Anemia in chronic kidney disease: Secondary | ICD-10-CM | POA: Diagnosis not present

## 2018-08-08 DIAGNOSIS — N2581 Secondary hyperparathyroidism of renal origin: Secondary | ICD-10-CM | POA: Diagnosis not present

## 2018-08-11 DIAGNOSIS — D631 Anemia in chronic kidney disease: Secondary | ICD-10-CM | POA: Diagnosis not present

## 2018-08-11 DIAGNOSIS — N2581 Secondary hyperparathyroidism of renal origin: Secondary | ICD-10-CM | POA: Diagnosis not present

## 2018-08-11 DIAGNOSIS — N186 End stage renal disease: Secondary | ICD-10-CM | POA: Diagnosis not present

## 2018-08-11 DIAGNOSIS — E875 Hyperkalemia: Secondary | ICD-10-CM | POA: Diagnosis not present

## 2018-08-13 DIAGNOSIS — N2581 Secondary hyperparathyroidism of renal origin: Secondary | ICD-10-CM | POA: Diagnosis not present

## 2018-08-13 DIAGNOSIS — N186 End stage renal disease: Secondary | ICD-10-CM | POA: Diagnosis not present

## 2018-08-13 DIAGNOSIS — E875 Hyperkalemia: Secondary | ICD-10-CM | POA: Diagnosis not present

## 2018-08-13 DIAGNOSIS — D631 Anemia in chronic kidney disease: Secondary | ICD-10-CM | POA: Diagnosis not present

## 2018-08-15 DIAGNOSIS — D631 Anemia in chronic kidney disease: Secondary | ICD-10-CM | POA: Diagnosis not present

## 2018-08-15 DIAGNOSIS — N2581 Secondary hyperparathyroidism of renal origin: Secondary | ICD-10-CM | POA: Diagnosis not present

## 2018-08-15 DIAGNOSIS — E875 Hyperkalemia: Secondary | ICD-10-CM | POA: Diagnosis not present

## 2018-08-15 DIAGNOSIS — N186 End stage renal disease: Secondary | ICD-10-CM | POA: Diagnosis not present

## 2018-08-17 DIAGNOSIS — N186 End stage renal disease: Secondary | ICD-10-CM | POA: Diagnosis not present

## 2018-08-17 DIAGNOSIS — N2581 Secondary hyperparathyroidism of renal origin: Secondary | ICD-10-CM | POA: Diagnosis not present

## 2018-08-17 DIAGNOSIS — E875 Hyperkalemia: Secondary | ICD-10-CM | POA: Diagnosis not present

## 2018-08-17 DIAGNOSIS — D631 Anemia in chronic kidney disease: Secondary | ICD-10-CM | POA: Diagnosis not present

## 2018-08-19 DIAGNOSIS — N2581 Secondary hyperparathyroidism of renal origin: Secondary | ICD-10-CM | POA: Diagnosis not present

## 2018-08-19 DIAGNOSIS — N186 End stage renal disease: Secondary | ICD-10-CM | POA: Diagnosis not present

## 2018-08-19 DIAGNOSIS — D631 Anemia in chronic kidney disease: Secondary | ICD-10-CM | POA: Diagnosis not present

## 2018-08-19 DIAGNOSIS — E875 Hyperkalemia: Secondary | ICD-10-CM | POA: Diagnosis not present

## 2018-08-22 DIAGNOSIS — N186 End stage renal disease: Secondary | ICD-10-CM | POA: Diagnosis not present

## 2018-08-22 DIAGNOSIS — D631 Anemia in chronic kidney disease: Secondary | ICD-10-CM | POA: Diagnosis not present

## 2018-08-22 DIAGNOSIS — N2581 Secondary hyperparathyroidism of renal origin: Secondary | ICD-10-CM | POA: Diagnosis not present

## 2018-08-22 DIAGNOSIS — E875 Hyperkalemia: Secondary | ICD-10-CM | POA: Diagnosis not present

## 2018-08-23 DIAGNOSIS — N186 End stage renal disease: Secondary | ICD-10-CM | POA: Diagnosis not present

## 2018-08-23 DIAGNOSIS — Z992 Dependence on renal dialysis: Secondary | ICD-10-CM | POA: Diagnosis not present

## 2018-08-25 DIAGNOSIS — D631 Anemia in chronic kidney disease: Secondary | ICD-10-CM | POA: Diagnosis not present

## 2018-08-25 DIAGNOSIS — N186 End stage renal disease: Secondary | ICD-10-CM | POA: Diagnosis not present

## 2018-08-25 DIAGNOSIS — N2581 Secondary hyperparathyroidism of renal origin: Secondary | ICD-10-CM | POA: Diagnosis not present

## 2018-08-27 DIAGNOSIS — N2581 Secondary hyperparathyroidism of renal origin: Secondary | ICD-10-CM | POA: Diagnosis not present

## 2018-08-27 DIAGNOSIS — D631 Anemia in chronic kidney disease: Secondary | ICD-10-CM | POA: Diagnosis not present

## 2018-08-27 DIAGNOSIS — N186 End stage renal disease: Secondary | ICD-10-CM | POA: Diagnosis not present

## 2018-08-28 ENCOUNTER — Encounter: Payer: Self-pay | Admitting: Gastroenterology

## 2018-08-29 DIAGNOSIS — D631 Anemia in chronic kidney disease: Secondary | ICD-10-CM | POA: Diagnosis not present

## 2018-08-29 DIAGNOSIS — N186 End stage renal disease: Secondary | ICD-10-CM | POA: Diagnosis not present

## 2018-08-29 DIAGNOSIS — N2581 Secondary hyperparathyroidism of renal origin: Secondary | ICD-10-CM | POA: Diagnosis not present

## 2018-09-01 DIAGNOSIS — D631 Anemia in chronic kidney disease: Secondary | ICD-10-CM | POA: Diagnosis not present

## 2018-09-01 DIAGNOSIS — N186 End stage renal disease: Secondary | ICD-10-CM | POA: Diagnosis not present

## 2018-09-01 DIAGNOSIS — N2581 Secondary hyperparathyroidism of renal origin: Secondary | ICD-10-CM | POA: Diagnosis not present

## 2018-09-02 DIAGNOSIS — F319 Bipolar disorder, unspecified: Secondary | ICD-10-CM | POA: Diagnosis not present

## 2018-09-02 DIAGNOSIS — M159 Polyosteoarthritis, unspecified: Secondary | ICD-10-CM | POA: Diagnosis not present

## 2018-09-02 DIAGNOSIS — I129 Hypertensive chronic kidney disease with stage 1 through stage 4 chronic kidney disease, or unspecified chronic kidney disease: Secondary | ICD-10-CM | POA: Diagnosis not present

## 2018-09-02 DIAGNOSIS — Z6824 Body mass index (BMI) 24.0-24.9, adult: Secondary | ICD-10-CM | POA: Diagnosis not present

## 2018-09-03 DIAGNOSIS — N2581 Secondary hyperparathyroidism of renal origin: Secondary | ICD-10-CM | POA: Diagnosis not present

## 2018-09-03 DIAGNOSIS — N186 End stage renal disease: Secondary | ICD-10-CM | POA: Diagnosis not present

## 2018-09-03 DIAGNOSIS — D631 Anemia in chronic kidney disease: Secondary | ICD-10-CM | POA: Diagnosis not present

## 2018-09-05 DIAGNOSIS — D631 Anemia in chronic kidney disease: Secondary | ICD-10-CM | POA: Diagnosis not present

## 2018-09-05 DIAGNOSIS — N186 End stage renal disease: Secondary | ICD-10-CM | POA: Diagnosis not present

## 2018-09-05 DIAGNOSIS — N2581 Secondary hyperparathyroidism of renal origin: Secondary | ICD-10-CM | POA: Diagnosis not present

## 2018-09-08 DIAGNOSIS — N186 End stage renal disease: Secondary | ICD-10-CM | POA: Diagnosis not present

## 2018-09-08 DIAGNOSIS — D631 Anemia in chronic kidney disease: Secondary | ICD-10-CM | POA: Diagnosis not present

## 2018-09-08 DIAGNOSIS — N2581 Secondary hyperparathyroidism of renal origin: Secondary | ICD-10-CM | POA: Diagnosis not present

## 2018-09-10 DIAGNOSIS — N2581 Secondary hyperparathyroidism of renal origin: Secondary | ICD-10-CM | POA: Diagnosis not present

## 2018-09-10 DIAGNOSIS — N186 End stage renal disease: Secondary | ICD-10-CM | POA: Diagnosis not present

## 2018-09-10 DIAGNOSIS — D631 Anemia in chronic kidney disease: Secondary | ICD-10-CM | POA: Diagnosis not present

## 2018-09-11 DIAGNOSIS — F411 Generalized anxiety disorder: Secondary | ICD-10-CM | POA: Diagnosis not present

## 2018-09-11 DIAGNOSIS — F250 Schizoaffective disorder, bipolar type: Secondary | ICD-10-CM | POA: Diagnosis not present

## 2018-09-12 DIAGNOSIS — D631 Anemia in chronic kidney disease: Secondary | ICD-10-CM | POA: Diagnosis not present

## 2018-09-12 DIAGNOSIS — N186 End stage renal disease: Secondary | ICD-10-CM | POA: Diagnosis not present

## 2018-09-12 DIAGNOSIS — N2581 Secondary hyperparathyroidism of renal origin: Secondary | ICD-10-CM | POA: Diagnosis not present

## 2018-09-14 DIAGNOSIS — N2581 Secondary hyperparathyroidism of renal origin: Secondary | ICD-10-CM | POA: Diagnosis not present

## 2018-09-14 DIAGNOSIS — D631 Anemia in chronic kidney disease: Secondary | ICD-10-CM | POA: Diagnosis not present

## 2018-09-14 DIAGNOSIS — N186 End stage renal disease: Secondary | ICD-10-CM | POA: Diagnosis not present

## 2018-09-16 DIAGNOSIS — D631 Anemia in chronic kidney disease: Secondary | ICD-10-CM | POA: Diagnosis not present

## 2018-09-16 DIAGNOSIS — N2581 Secondary hyperparathyroidism of renal origin: Secondary | ICD-10-CM | POA: Diagnosis not present

## 2018-09-16 DIAGNOSIS — N186 End stage renal disease: Secondary | ICD-10-CM | POA: Diagnosis not present

## 2018-09-19 DIAGNOSIS — N2581 Secondary hyperparathyroidism of renal origin: Secondary | ICD-10-CM | POA: Diagnosis not present

## 2018-09-19 DIAGNOSIS — N186 End stage renal disease: Secondary | ICD-10-CM | POA: Diagnosis not present

## 2018-09-19 DIAGNOSIS — D631 Anemia in chronic kidney disease: Secondary | ICD-10-CM | POA: Diagnosis not present

## 2018-09-21 DIAGNOSIS — D631 Anemia in chronic kidney disease: Secondary | ICD-10-CM | POA: Diagnosis not present

## 2018-09-21 DIAGNOSIS — N186 End stage renal disease: Secondary | ICD-10-CM | POA: Diagnosis not present

## 2018-09-21 DIAGNOSIS — N2581 Secondary hyperparathyroidism of renal origin: Secondary | ICD-10-CM | POA: Diagnosis not present

## 2018-09-23 DIAGNOSIS — N186 End stage renal disease: Secondary | ICD-10-CM | POA: Diagnosis not present

## 2018-09-23 DIAGNOSIS — Z992 Dependence on renal dialysis: Secondary | ICD-10-CM | POA: Diagnosis not present

## 2018-09-23 DIAGNOSIS — D631 Anemia in chronic kidney disease: Secondary | ICD-10-CM | POA: Diagnosis not present

## 2018-09-23 DIAGNOSIS — N2581 Secondary hyperparathyroidism of renal origin: Secondary | ICD-10-CM | POA: Diagnosis not present

## 2018-09-26 DIAGNOSIS — D509 Iron deficiency anemia, unspecified: Secondary | ICD-10-CM | POA: Diagnosis not present

## 2018-09-26 DIAGNOSIS — D631 Anemia in chronic kidney disease: Secondary | ICD-10-CM | POA: Diagnosis not present

## 2018-09-26 DIAGNOSIS — N186 End stage renal disease: Secondary | ICD-10-CM | POA: Diagnosis not present

## 2018-09-26 DIAGNOSIS — N2581 Secondary hyperparathyroidism of renal origin: Secondary | ICD-10-CM | POA: Diagnosis not present

## 2018-09-29 DIAGNOSIS — D631 Anemia in chronic kidney disease: Secondary | ICD-10-CM | POA: Diagnosis not present

## 2018-09-29 DIAGNOSIS — N2581 Secondary hyperparathyroidism of renal origin: Secondary | ICD-10-CM | POA: Diagnosis not present

## 2018-09-29 DIAGNOSIS — N186 End stage renal disease: Secondary | ICD-10-CM | POA: Diagnosis not present

## 2018-09-29 DIAGNOSIS — D509 Iron deficiency anemia, unspecified: Secondary | ICD-10-CM | POA: Diagnosis not present

## 2018-09-30 ENCOUNTER — Other Ambulatory Visit (HOSPITAL_COMMUNITY): Payer: Self-pay | Admitting: Gastroenterology

## 2018-09-30 DIAGNOSIS — T82858A Stenosis of vascular prosthetic devices, implants and grafts, initial encounter: Secondary | ICD-10-CM | POA: Diagnosis not present

## 2018-09-30 DIAGNOSIS — R52 Pain, unspecified: Secondary | ICD-10-CM | POA: Diagnosis not present

## 2018-09-30 DIAGNOSIS — T82898A Other specified complication of vascular prosthetic devices, implants and grafts, initial encounter: Secondary | ICD-10-CM | POA: Diagnosis not present

## 2018-10-01 DIAGNOSIS — N2581 Secondary hyperparathyroidism of renal origin: Secondary | ICD-10-CM | POA: Diagnosis not present

## 2018-10-01 DIAGNOSIS — N186 End stage renal disease: Secondary | ICD-10-CM | POA: Diagnosis not present

## 2018-10-01 DIAGNOSIS — D509 Iron deficiency anemia, unspecified: Secondary | ICD-10-CM | POA: Diagnosis not present

## 2018-10-01 DIAGNOSIS — D631 Anemia in chronic kidney disease: Secondary | ICD-10-CM | POA: Diagnosis not present

## 2018-10-02 ENCOUNTER — Other Ambulatory Visit: Payer: Medicare Other

## 2018-10-02 ENCOUNTER — Ambulatory Visit: Payer: Medicare Other | Admitting: Gastroenterology

## 2018-10-02 ENCOUNTER — Ambulatory Visit
Admission: RE | Admit: 2018-10-02 | Discharge: 2018-10-02 | Disposition: A | Discharge status: Home / Self Care | Payer: Medicare Other | Source: Ambulatory Visit | Attending: Family Medicine | Admitting: Family Medicine

## 2018-10-02 DIAGNOSIS — M85832 Other specified disorders of bone density and structure, left forearm: Secondary | ICD-10-CM | POA: Diagnosis not present

## 2018-10-02 DIAGNOSIS — M810 Age-related osteoporosis without current pathological fracture: Secondary | ICD-10-CM | POA: Diagnosis not present

## 2018-10-02 DIAGNOSIS — E2839 Other primary ovarian failure: Secondary | ICD-10-CM

## 2018-10-02 DIAGNOSIS — Z780 Asymptomatic menopausal state: Secondary | ICD-10-CM | POA: Diagnosis not present

## 2018-10-03 DIAGNOSIS — D631 Anemia in chronic kidney disease: Secondary | ICD-10-CM | POA: Diagnosis not present

## 2018-10-03 DIAGNOSIS — N186 End stage renal disease: Secondary | ICD-10-CM | POA: Diagnosis not present

## 2018-10-03 DIAGNOSIS — D509 Iron deficiency anemia, unspecified: Secondary | ICD-10-CM | POA: Diagnosis not present

## 2018-10-03 DIAGNOSIS — N2581 Secondary hyperparathyroidism of renal origin: Secondary | ICD-10-CM | POA: Diagnosis not present

## 2018-10-06 DIAGNOSIS — D631 Anemia in chronic kidney disease: Secondary | ICD-10-CM | POA: Diagnosis not present

## 2018-10-06 DIAGNOSIS — N186 End stage renal disease: Secondary | ICD-10-CM | POA: Diagnosis not present

## 2018-10-06 DIAGNOSIS — N2581 Secondary hyperparathyroidism of renal origin: Secondary | ICD-10-CM | POA: Diagnosis not present

## 2018-10-06 DIAGNOSIS — D509 Iron deficiency anemia, unspecified: Secondary | ICD-10-CM | POA: Diagnosis not present

## 2018-10-08 DIAGNOSIS — D509 Iron deficiency anemia, unspecified: Secondary | ICD-10-CM | POA: Diagnosis not present

## 2018-10-08 DIAGNOSIS — N251 Nephrogenic diabetes insipidus: Secondary | ICD-10-CM | POA: Diagnosis not present

## 2018-10-08 DIAGNOSIS — D631 Anemia in chronic kidney disease: Secondary | ICD-10-CM | POA: Diagnosis not present

## 2018-10-08 DIAGNOSIS — N2581 Secondary hyperparathyroidism of renal origin: Secondary | ICD-10-CM | POA: Diagnosis not present

## 2018-10-08 DIAGNOSIS — N186 End stage renal disease: Secondary | ICD-10-CM | POA: Diagnosis not present

## 2018-10-10 DIAGNOSIS — N2581 Secondary hyperparathyroidism of renal origin: Secondary | ICD-10-CM | POA: Diagnosis not present

## 2018-10-10 DIAGNOSIS — N186 End stage renal disease: Secondary | ICD-10-CM | POA: Diagnosis not present

## 2018-10-10 DIAGNOSIS — D631 Anemia in chronic kidney disease: Secondary | ICD-10-CM | POA: Diagnosis not present

## 2018-10-10 DIAGNOSIS — D509 Iron deficiency anemia, unspecified: Secondary | ICD-10-CM | POA: Diagnosis not present

## 2018-10-13 DIAGNOSIS — D631 Anemia in chronic kidney disease: Secondary | ICD-10-CM | POA: Diagnosis not present

## 2018-10-13 DIAGNOSIS — D509 Iron deficiency anemia, unspecified: Secondary | ICD-10-CM | POA: Diagnosis not present

## 2018-10-13 DIAGNOSIS — N2581 Secondary hyperparathyroidism of renal origin: Secondary | ICD-10-CM | POA: Diagnosis not present

## 2018-10-13 DIAGNOSIS — N186 End stage renal disease: Secondary | ICD-10-CM | POA: Diagnosis not present

## 2018-10-15 DIAGNOSIS — N2581 Secondary hyperparathyroidism of renal origin: Secondary | ICD-10-CM | POA: Diagnosis not present

## 2018-10-15 DIAGNOSIS — D509 Iron deficiency anemia, unspecified: Secondary | ICD-10-CM | POA: Diagnosis not present

## 2018-10-15 DIAGNOSIS — N186 End stage renal disease: Secondary | ICD-10-CM | POA: Diagnosis not present

## 2018-10-15 DIAGNOSIS — D631 Anemia in chronic kidney disease: Secondary | ICD-10-CM | POA: Diagnosis not present

## 2018-10-17 DIAGNOSIS — N186 End stage renal disease: Secondary | ICD-10-CM | POA: Diagnosis not present

## 2018-10-17 DIAGNOSIS — D631 Anemia in chronic kidney disease: Secondary | ICD-10-CM | POA: Diagnosis not present

## 2018-10-17 DIAGNOSIS — N2581 Secondary hyperparathyroidism of renal origin: Secondary | ICD-10-CM | POA: Diagnosis not present

## 2018-10-17 DIAGNOSIS — D509 Iron deficiency anemia, unspecified: Secondary | ICD-10-CM | POA: Diagnosis not present

## 2018-10-20 DIAGNOSIS — D509 Iron deficiency anemia, unspecified: Secondary | ICD-10-CM | POA: Diagnosis not present

## 2018-10-20 DIAGNOSIS — D631 Anemia in chronic kidney disease: Secondary | ICD-10-CM | POA: Diagnosis not present

## 2018-10-20 DIAGNOSIS — N2581 Secondary hyperparathyroidism of renal origin: Secondary | ICD-10-CM | POA: Diagnosis not present

## 2018-10-20 DIAGNOSIS — N186 End stage renal disease: Secondary | ICD-10-CM | POA: Diagnosis not present

## 2018-10-22 DIAGNOSIS — N2581 Secondary hyperparathyroidism of renal origin: Secondary | ICD-10-CM | POA: Diagnosis not present

## 2018-10-22 DIAGNOSIS — D631 Anemia in chronic kidney disease: Secondary | ICD-10-CM | POA: Diagnosis not present

## 2018-10-22 DIAGNOSIS — D509 Iron deficiency anemia, unspecified: Secondary | ICD-10-CM | POA: Diagnosis not present

## 2018-10-22 DIAGNOSIS — N186 End stage renal disease: Secondary | ICD-10-CM | POA: Diagnosis not present

## 2018-10-24 DIAGNOSIS — Z992 Dependence on renal dialysis: Secondary | ICD-10-CM | POA: Diagnosis not present

## 2018-10-24 DIAGNOSIS — N186 End stage renal disease: Secondary | ICD-10-CM | POA: Diagnosis not present

## 2018-10-24 DIAGNOSIS — D631 Anemia in chronic kidney disease: Secondary | ICD-10-CM | POA: Diagnosis not present

## 2018-10-24 DIAGNOSIS — D509 Iron deficiency anemia, unspecified: Secondary | ICD-10-CM | POA: Diagnosis not present

## 2018-10-24 DIAGNOSIS — N2581 Secondary hyperparathyroidism of renal origin: Secondary | ICD-10-CM | POA: Diagnosis not present

## 2018-10-27 DIAGNOSIS — E875 Hyperkalemia: Secondary | ICD-10-CM | POA: Diagnosis not present

## 2018-10-27 DIAGNOSIS — D631 Anemia in chronic kidney disease: Secondary | ICD-10-CM | POA: Diagnosis not present

## 2018-10-27 DIAGNOSIS — D509 Iron deficiency anemia, unspecified: Secondary | ICD-10-CM | POA: Diagnosis not present

## 2018-10-27 DIAGNOSIS — N2581 Secondary hyperparathyroidism of renal origin: Secondary | ICD-10-CM | POA: Diagnosis not present

## 2018-10-27 DIAGNOSIS — N186 End stage renal disease: Secondary | ICD-10-CM | POA: Diagnosis not present

## 2018-10-29 DIAGNOSIS — E875 Hyperkalemia: Secondary | ICD-10-CM | POA: Diagnosis not present

## 2018-10-29 DIAGNOSIS — N2581 Secondary hyperparathyroidism of renal origin: Secondary | ICD-10-CM | POA: Diagnosis not present

## 2018-10-29 DIAGNOSIS — D509 Iron deficiency anemia, unspecified: Secondary | ICD-10-CM | POA: Diagnosis not present

## 2018-10-29 DIAGNOSIS — D631 Anemia in chronic kidney disease: Secondary | ICD-10-CM | POA: Diagnosis not present

## 2018-10-29 DIAGNOSIS — N186 End stage renal disease: Secondary | ICD-10-CM | POA: Diagnosis not present

## 2018-10-31 DIAGNOSIS — N186 End stage renal disease: Secondary | ICD-10-CM | POA: Diagnosis not present

## 2018-10-31 DIAGNOSIS — E875 Hyperkalemia: Secondary | ICD-10-CM | POA: Diagnosis not present

## 2018-10-31 DIAGNOSIS — D631 Anemia in chronic kidney disease: Secondary | ICD-10-CM | POA: Diagnosis not present

## 2018-10-31 DIAGNOSIS — N2581 Secondary hyperparathyroidism of renal origin: Secondary | ICD-10-CM | POA: Diagnosis not present

## 2018-10-31 DIAGNOSIS — D509 Iron deficiency anemia, unspecified: Secondary | ICD-10-CM | POA: Diagnosis not present

## 2018-11-03 DIAGNOSIS — D509 Iron deficiency anemia, unspecified: Secondary | ICD-10-CM | POA: Diagnosis not present

## 2018-11-03 DIAGNOSIS — D631 Anemia in chronic kidney disease: Secondary | ICD-10-CM | POA: Diagnosis not present

## 2018-11-03 DIAGNOSIS — N2581 Secondary hyperparathyroidism of renal origin: Secondary | ICD-10-CM | POA: Diagnosis not present

## 2018-11-03 DIAGNOSIS — N186 End stage renal disease: Secondary | ICD-10-CM | POA: Diagnosis not present

## 2018-11-03 DIAGNOSIS — E875 Hyperkalemia: Secondary | ICD-10-CM | POA: Diagnosis not present

## 2018-11-05 DIAGNOSIS — D509 Iron deficiency anemia, unspecified: Secondary | ICD-10-CM | POA: Diagnosis not present

## 2018-11-05 DIAGNOSIS — N186 End stage renal disease: Secondary | ICD-10-CM | POA: Diagnosis not present

## 2018-11-05 DIAGNOSIS — N2581 Secondary hyperparathyroidism of renal origin: Secondary | ICD-10-CM | POA: Diagnosis not present

## 2018-11-05 DIAGNOSIS — D631 Anemia in chronic kidney disease: Secondary | ICD-10-CM | POA: Diagnosis not present

## 2018-11-05 DIAGNOSIS — E875 Hyperkalemia: Secondary | ICD-10-CM | POA: Diagnosis not present

## 2018-11-07 DIAGNOSIS — N186 End stage renal disease: Secondary | ICD-10-CM | POA: Diagnosis not present

## 2018-11-07 DIAGNOSIS — N2581 Secondary hyperparathyroidism of renal origin: Secondary | ICD-10-CM | POA: Diagnosis not present

## 2018-11-07 DIAGNOSIS — D631 Anemia in chronic kidney disease: Secondary | ICD-10-CM | POA: Diagnosis not present

## 2018-11-07 DIAGNOSIS — E875 Hyperkalemia: Secondary | ICD-10-CM | POA: Diagnosis not present

## 2018-11-07 DIAGNOSIS — D509 Iron deficiency anemia, unspecified: Secondary | ICD-10-CM | POA: Diagnosis not present

## 2018-11-10 DIAGNOSIS — N186 End stage renal disease: Secondary | ICD-10-CM | POA: Diagnosis not present

## 2018-11-10 DIAGNOSIS — D631 Anemia in chronic kidney disease: Secondary | ICD-10-CM | POA: Diagnosis not present

## 2018-11-10 DIAGNOSIS — D509 Iron deficiency anemia, unspecified: Secondary | ICD-10-CM | POA: Diagnosis not present

## 2018-11-10 DIAGNOSIS — E875 Hyperkalemia: Secondary | ICD-10-CM | POA: Diagnosis not present

## 2018-11-10 DIAGNOSIS — N2581 Secondary hyperparathyroidism of renal origin: Secondary | ICD-10-CM | POA: Diagnosis not present

## 2018-11-12 DIAGNOSIS — D509 Iron deficiency anemia, unspecified: Secondary | ICD-10-CM | POA: Diagnosis not present

## 2018-11-12 DIAGNOSIS — E875 Hyperkalemia: Secondary | ICD-10-CM | POA: Diagnosis not present

## 2018-11-12 DIAGNOSIS — N186 End stage renal disease: Secondary | ICD-10-CM | POA: Diagnosis not present

## 2018-11-12 DIAGNOSIS — D631 Anemia in chronic kidney disease: Secondary | ICD-10-CM | POA: Diagnosis not present

## 2018-11-12 DIAGNOSIS — N2581 Secondary hyperparathyroidism of renal origin: Secondary | ICD-10-CM | POA: Diagnosis not present

## 2018-11-14 DIAGNOSIS — D509 Iron deficiency anemia, unspecified: Secondary | ICD-10-CM | POA: Diagnosis not present

## 2018-11-14 DIAGNOSIS — E875 Hyperkalemia: Secondary | ICD-10-CM | POA: Diagnosis not present

## 2018-11-14 DIAGNOSIS — D631 Anemia in chronic kidney disease: Secondary | ICD-10-CM | POA: Diagnosis not present

## 2018-11-14 DIAGNOSIS — N2581 Secondary hyperparathyroidism of renal origin: Secondary | ICD-10-CM | POA: Diagnosis not present

## 2018-11-14 DIAGNOSIS — N186 End stage renal disease: Secondary | ICD-10-CM | POA: Diagnosis not present

## 2018-11-17 DIAGNOSIS — N2581 Secondary hyperparathyroidism of renal origin: Secondary | ICD-10-CM | POA: Diagnosis not present

## 2018-11-17 DIAGNOSIS — D509 Iron deficiency anemia, unspecified: Secondary | ICD-10-CM | POA: Diagnosis not present

## 2018-11-17 DIAGNOSIS — N186 End stage renal disease: Secondary | ICD-10-CM | POA: Diagnosis not present

## 2018-11-17 DIAGNOSIS — D631 Anemia in chronic kidney disease: Secondary | ICD-10-CM | POA: Diagnosis not present

## 2018-11-17 DIAGNOSIS — E875 Hyperkalemia: Secondary | ICD-10-CM | POA: Diagnosis not present

## 2018-11-19 DIAGNOSIS — D509 Iron deficiency anemia, unspecified: Secondary | ICD-10-CM | POA: Diagnosis not present

## 2018-11-19 DIAGNOSIS — N2581 Secondary hyperparathyroidism of renal origin: Secondary | ICD-10-CM | POA: Diagnosis not present

## 2018-11-19 DIAGNOSIS — E875 Hyperkalemia: Secondary | ICD-10-CM | POA: Diagnosis not present

## 2018-11-19 DIAGNOSIS — D631 Anemia in chronic kidney disease: Secondary | ICD-10-CM | POA: Diagnosis not present

## 2018-11-19 DIAGNOSIS — N186 End stage renal disease: Secondary | ICD-10-CM | POA: Diagnosis not present

## 2018-11-20 ENCOUNTER — Encounter: Payer: Self-pay | Admitting: *Deleted

## 2018-11-20 ENCOUNTER — Ambulatory Visit: Payer: Medicare Other | Admitting: Gastroenterology

## 2018-11-20 ENCOUNTER — Other Ambulatory Visit: Payer: Self-pay | Admitting: *Deleted

## 2018-11-20 ENCOUNTER — Ambulatory Visit (INDEPENDENT_AMBULATORY_CARE_PROVIDER_SITE_OTHER): Payer: Medicare Other | Admitting: Nurse Practitioner

## 2018-11-20 ENCOUNTER — Encounter: Payer: Self-pay | Admitting: Nurse Practitioner

## 2018-11-20 VITALS — BP 123/72 | HR 91 | Temp 98.3°F | Ht 64.0 in | Wt 132.2 lb

## 2018-11-20 DIAGNOSIS — Z86010 Personal history of colonic polyps: Secondary | ICD-10-CM | POA: Diagnosis not present

## 2018-11-20 DIAGNOSIS — D649 Anemia, unspecified: Secondary | ICD-10-CM | POA: Diagnosis not present

## 2018-11-20 DIAGNOSIS — Z860101 Personal history of adenomatous and serrated colon polyps: Secondary | ICD-10-CM | POA: Insufficient documentation

## 2018-11-20 NOTE — Patient Instructions (Signed)
Your health issues we discussed today were:   Anemia: 1. We will request labs that were recently drawn at hemodialysis. 2. We will continue to monitor your blood cell counts 3. Call us if you notice any bleeding in your stools  Need for colonoscopy: 1. Your last colonoscopy had multiple polyps 2. I recommended a repeat colonoscopy in 1 year.  You are currently due 3. We will schedule your colonoscopy and further recommendations will follow  Overall I recommend:  1. Return for follow-up in 6 months 2. Call us if you have any questions or concerns.  At [facility] we value your feedback. You may receive a survey about your visit today. Please share your experience as we strive to create trusting relationships with our patients to provide genuine, compassionate, quality care.  We appreciate your understanding and patience as we review any laboratory studies, imaging, and other diagnostic tests that are ordered as we care for you. Our office policy is 5 business days for review of these results, and any emergent or urgent results are addressed in a timely manner for your best interest. If you do not hear from our office in 1 week, please contact us.   We also encourage the use of MyChart, which contains your medical information for your review as well. If you are not enrolled in this feature, an access code is on this after visit summary for your convenience. Thank you for allowing [place] to be involved in your care.  It was great to see you today!  I hope you have a great day!!

## 2018-11-20 NOTE — Assessment & Plan Note (Signed)
Previous colonoscopy in 2017 had multiple tubular adenoma polyps and a single tubulovillous adenoma.  Recommended repeat exam in 1 year.  She is currently overdue.  At this time we will proceed with scheduling colonoscopy.  Follow-up in 6 months.  Further recommendations to follow colonoscopy.

## 2018-11-20 NOTE — Progress Notes (Signed)
cc'ed to pcp °

## 2018-11-20 NOTE — Progress Notes (Signed)
Referring Provider: Lucianne Lei, MD Primary Care Physician:  Lucianne Lei, MD Primary GI:  Dr. Oneida Alar  Chief Complaint  Patient presents with  . Anemia    HPI:   Madison [name] is a 68 y.o. female who presents for follow-up on anemia.  The patient was last seen in our office 03/20/2018 for normocytic anemia.  History of end-stage renal disease on hemodialysis.  EGD in March with gastritis and duodenitis but no H. pylori.  Small bowel capsule endoscopy with occasional benign appearing polyp of the jejunum with no active bleeding.  Advised to continue iron and consider balloon enteroscopy if develops transfusion dependent anemia or evidence of active GI bleed.    At her last visit the patient noted dark stools on iron, no fresh blood per rectum, regular bowel movements, appetite fair, no abdominal pain.  Concerns of mobility and home physical therapy prior to starting dialysis last fall.  Recommend she follow-up with primary care for those concerns.  Most recent colonoscopy April 2017 with a 12 mm tubular adenoma in the transverse colon and recommended repeat in April 2020.  Recommended request most recent labs, continue to monitor unless transfusion dependent anemia or obvious GI bleed.  Follow-up in 4 months.  Most recent labs from 03/19/2018 found hemoglobin of 12.8.  Today she states she has not seen any recent rectal bleeding. Patient is a poor historian and accompanied by her cousin. Had a brown stain in her underwear which she thinks is vaginal drainage. Still on dialysis MWF. Denies abdominal pain, N/V, fever, chills. Is having leg cramps. GERD well managed on PPI.  Past Medical History:  Diagnosis Date  . Anxiety   . ESRD (end stage renal disease) on dialysis ([place])   . Neuropathy   . Schizophrenia [facility])     Past Surgical History:  Procedure Laterality Date  . CHOLECYSTECTOMY    . COLONOSCOPY N/A 01/20/2016   Dr. Oneida Alar: 12 mm tubular adenoma removed from the transverse  colon, 4 hyperplastic polyps removed from the rectum and sigmoid colon.  Next colonoscopy planned for April 2020.  Marland Kitchen ESOPHAGOGASTRODUODENOSCOPY N/A 12/19/2017   Procedure: ESOPHAGOGASTRODUODENOSCOPY (EGD);  Surgeon: [name], MD;  [facility];  Service: Endoscopy;  Laterality: N/A;  8:30am  . fistula left arm    . GIVENS CAPSULE STUDY N/A 01/14/2018   Procedure: GIVENS CAPSULE STUDY;  Surgeon: [name], MD;  Location: [facility];  Service: Endoscopy;  Laterality: N/A;  7:30am  . HEMICOLECTOMY Right   . TUBAL LIGATION      Current Outpatient Medications  Medication Sig Dispense Refill  . ferric citrate (AURYXIA) 1 GM 210 MG(Fe) tablet Take 210 mg by mouth 3 (three) times daily with meals.     . gabapentin (NEURONTIN) 300 MG capsule Take 300 mg by mouth at bedtime.     . mirtazapine (REMERON) 30 MG tablet Take 30 mg by mouth at bedtime.     . Multiple Vitamin (MULTIVITAMIN) tablet Take 1 tablet by mouth daily.    Marland Kitchen OLANZapine (ZYPREXA) 15 MG tablet Take 15 mg by mouth at bedtime.     . pantoprazole (PROTONIX) 40 MG tablet TAKE 1 TABLET BY MOUTH TWICE DAILY 30 MINUTES BEFORE MEALS FOR 3 MONTHS THEN DECREASE TO 1 TABLET DAILY. 56 tablet 3  . thiamine (VITAMIN B-1) 100 MG tablet Take 50 mg by mouth daily.      No current facility-administered medications for this visit.     Allergies  as of 11/20/2018  . (No Known Allergies)    Family History  Problem Relation Age of Onset  . Alzheimer's disease Mother   . Cancer Father   . Kidney failure Sister   . Breast cancer Neg Hx     Social History   Socioeconomic History  . Marital status: Single    Spouse name: Not on file  . Number of children: Not on file  . Years of education: Not on file  . Highest education level: Not on file  Occupational History  . Not on file  Social Needs  . Financial resource strain: Not on file  . Food insecurity:    Worry: Not on file    Inability: Not on file  .  Transportation needs:    Medical: Not on file    Non-medical: Not on file  Tobacco Use  . Smoking status: Never Smoker  . Smokeless tobacco: Never Used  Substance and Sexual Activity  . Alcohol use: No  . Drug use: No  . Sexual activity: Not on file  Lifestyle  . Physical activity:    Days per week: Not on file    Minutes per session: Not on file  . Stress: Not on file  Relationships  . Social connections:    Talks on phone: Not on file    Gets together: Not on file    Attends religious service: Not on file    Active member of club or organization: Not on file    Attends meetings of clubs or organizations: Not on file    Relationship status: Not on file  Other Topics Concern  . Not on file  Social History Narrative  . Not on file    Review of Systems: Complete ROS negative except as per HPI.   Physical Exam: BP 123/72   Pulse 91   Temp 98.3 F (36.8 C) (Oral)   Ht 5\' 4"  (1.626 m)   Wt 132 lb 3.2 oz (60 kg)   BMI 22.69 kg/m  General:   Alert and oriented. Pleasant and cooperative. Well-nourished and well-developed.  Eyes:  Without icterus, sclera clear and conjunctiva pink.  Ears:  Normal auditory acuity. Cardiovascular:  S1, S2 present without murmurs appreciated. Extremities without clubbing or edema. Respiratory:  Clear to auscultation bilaterally. No wheezes, rales, or rhonchi. No distress.  Gastrointestinal:  +BS, soft, non-tender and non-distended. No HSM noted. No guarding or rebound. No masses appreciated.  Rectal:  Deferred  Musculoskalatal:  Symmetrical without gross deformities. Neurologic:  Alert and oriented x4;  grossly normal neurologically. Psych:  Alert and cooperative. Normal mood and affect. Heme/Lymph/Immune: No excessive bruising noted.    11/20/2018 9:32 AM   Disclaimer: This note was dictated with voice recognition software. Similar sounding words can inadvertently be transcribed and may not be corrected upon review.

## 2018-11-20 NOTE — Assessment & Plan Note (Signed)
History of normocytic anemia and likely multifactorial in nature.  We are currently monitoring for any changes.  Today she denies any hematochezia or melena.  Just recently had labs drawn at hemodialysis and we will call and request these labs to update her hemoglobin.  Follow-up in 6 months.  Call if any bleeding noted.

## 2018-11-21 DIAGNOSIS — D631 Anemia in chronic kidney disease: Secondary | ICD-10-CM | POA: Diagnosis not present

## 2018-11-21 DIAGNOSIS — E875 Hyperkalemia: Secondary | ICD-10-CM | POA: Diagnosis not present

## 2018-11-21 DIAGNOSIS — D509 Iron deficiency anemia, unspecified: Secondary | ICD-10-CM | POA: Diagnosis not present

## 2018-11-21 DIAGNOSIS — N2581 Secondary hyperparathyroidism of renal origin: Secondary | ICD-10-CM | POA: Diagnosis not present

## 2018-11-21 DIAGNOSIS — N186 End stage renal disease: Secondary | ICD-10-CM | POA: Diagnosis not present

## 2018-11-22 DIAGNOSIS — Z992 Dependence on renal dialysis: Secondary | ICD-10-CM | POA: Diagnosis not present

## 2018-11-22 DIAGNOSIS — N186 End stage renal disease: Secondary | ICD-10-CM | POA: Diagnosis not present

## 2018-11-24 DIAGNOSIS — N186 End stage renal disease: Secondary | ICD-10-CM | POA: Diagnosis not present

## 2018-11-24 DIAGNOSIS — N2581 Secondary hyperparathyroidism of renal origin: Secondary | ICD-10-CM | POA: Diagnosis not present

## 2018-11-26 DIAGNOSIS — N186 End stage renal disease: Secondary | ICD-10-CM | POA: Diagnosis not present

## 2018-11-26 DIAGNOSIS — N2581 Secondary hyperparathyroidism of renal origin: Secondary | ICD-10-CM | POA: Diagnosis not present

## 2018-11-28 DIAGNOSIS — N186 End stage renal disease: Secondary | ICD-10-CM | POA: Diagnosis not present

## 2018-11-28 DIAGNOSIS — N2581 Secondary hyperparathyroidism of renal origin: Secondary | ICD-10-CM | POA: Diagnosis not present

## 2018-12-01 DIAGNOSIS — N2581 Secondary hyperparathyroidism of renal origin: Secondary | ICD-10-CM | POA: Diagnosis not present

## 2018-12-01 DIAGNOSIS — N186 End stage renal disease: Secondary | ICD-10-CM | POA: Diagnosis not present

## 2018-12-02 ENCOUNTER — Encounter: Payer: Self-pay | Admitting: Obstetrics & Gynecology

## 2018-12-02 ENCOUNTER — Ambulatory Visit (INDEPENDENT_AMBULATORY_CARE_PROVIDER_SITE_OTHER): Payer: Medicare Other | Admitting: Obstetrics & Gynecology

## 2018-12-02 VITALS — BP 125/76 | HR 82 | Ht 64.0 in | Wt 134.0 lb

## 2018-12-02 DIAGNOSIS — A599 Trichomoniasis, unspecified: Secondary | ICD-10-CM

## 2018-12-02 MED ORDER — METRONIDAZOLE 500 MG PO TABS: 500.0000 mg | ORAL_TABLET | Freq: Two times a day (BID) | ORAL | 1 refills | Status: DC

## 2018-12-02 NOTE — Progress Notes (Signed)
       Chief Complaint  Patient presents with  . Vaginal Discharge    x several months    Blood pressure 125/76, pulse 82, height 5\' 4"  (1.626 m), weight 134 lb (60.8 kg).  68 y.o. G0P0000 No LMP recorded. Patient has had a hysterectomy. The current method of family planning is post menopausal status.  Subjective Vaginal discharge for 66months Itching no Irritation yes Odor no Similar to previous no  Previous treatment none  Past Medical History:  Diagnosis Date  . Anxiety   . ESRD (end stage renal disease) on dialysis ([place])   . Neuropathy   . Schizophrenia [facility])      Past Surgical History:  Procedure Laterality Date  . CHOLECYSTECTOMY    . COLONOSCOPY N/A 01/20/2016   Dr. Oneida Alar: 12 mm tubular adenoma removed from the transverse colon, 4 hyperplastic polyps removed from the rectum and sigmoid colon.  Next colonoscopy planned for April 2020.  Marland Kitchen ESOPHAGOGASTRODUODENOSCOPY N/A 12/19/2017   Procedure: ESOPHAGOGASTRODUODENOSCOPY (EGD);  Surgeon: [name], MD;  [facility];  Service: Endoscopy;  Laterality: N/A;  8:30am  . fistula left arm    . GIVENS CAPSULE STUDY N/A 01/14/2018   Procedure: GIVENS CAPSULE STUDY;  Surgeon: [name], MD;  [facility];  Service: Endoscopy;  Laterality: N/A;  7:30am  . HEMICOLECTOMY Right   . TUBAL LIGATION      Objective Vulva:  normal appearing vulva with no masses, tenderness or lesions Vagina:  normal mucosa, thin grey discharge Cervix:  no cervical motion tenderness and no lesions Uterus:   Adnexa: ovaries:,       Pertinent ROS No burning with urination, frequency or urgency No nausea, vomiting or diarrhea Nor fever chills or other constitutional symptoms   Labs or studies Wet Prep:   A sample of vaginal discharge was obtained from the posterior fornix using a cotton swab. 2 drops of saline were placed on a slide and the cotton swab was immersed in the saline. Microscopic evaluation was  performed and results were as follows:  Negative  for yeast  Negative for clue cells , consistent with Bacterial vaginosis Positive for trichomonas  Normal WBC population   Whiff test: Negative     Impression Diagnoses this Encounter::   ICD-10-CM   1. Trichomonas vaginalis infection A59.9     Established relevant diagnosis(es):   Plan/Recommendations: No orders of the defined types were placed in this encounter.   Labs or Scans Ordered: No orders of the defined types were placed in this encounter.   Management:: >metronidazole course for patient and partner >TOC 3 weeks  Follow up Return in about 3 weeks (around 12/23/2018) for Follow up, with Dr Elonda Husky.      All questions were answered.

## 2018-12-03 DIAGNOSIS — N186 End stage renal disease: Secondary | ICD-10-CM | POA: Diagnosis not present

## 2018-12-03 DIAGNOSIS — N2581 Secondary hyperparathyroidism of renal origin: Secondary | ICD-10-CM | POA: Diagnosis not present

## 2018-12-05 DIAGNOSIS — N2581 Secondary hyperparathyroidism of renal origin: Secondary | ICD-10-CM | POA: Diagnosis not present

## 2018-12-05 DIAGNOSIS — N186 End stage renal disease: Secondary | ICD-10-CM | POA: Diagnosis not present

## 2018-12-08 DIAGNOSIS — N186 End stage renal disease: Secondary | ICD-10-CM | POA: Diagnosis not present

## 2018-12-08 DIAGNOSIS — N2581 Secondary hyperparathyroidism of renal origin: Secondary | ICD-10-CM | POA: Diagnosis not present

## 2018-12-09 DIAGNOSIS — F411 Generalized anxiety disorder: Secondary | ICD-10-CM | POA: Diagnosis not present

## 2018-12-09 DIAGNOSIS — F250 Schizoaffective disorder, bipolar type: Secondary | ICD-10-CM | POA: Diagnosis not present

## 2018-12-10 DIAGNOSIS — N186 End stage renal disease: Secondary | ICD-10-CM | POA: Diagnosis not present

## 2018-12-10 DIAGNOSIS — N2581 Secondary hyperparathyroidism of renal origin: Secondary | ICD-10-CM | POA: Diagnosis not present

## 2018-12-12 DIAGNOSIS — N186 End stage renal disease: Secondary | ICD-10-CM | POA: Diagnosis not present

## 2018-12-12 DIAGNOSIS — N2581 Secondary hyperparathyroidism of renal origin: Secondary | ICD-10-CM | POA: Diagnosis not present

## 2018-12-15 DIAGNOSIS — N186 End stage renal disease: Secondary | ICD-10-CM | POA: Diagnosis not present

## 2018-12-15 DIAGNOSIS — N2581 Secondary hyperparathyroidism of renal origin: Secondary | ICD-10-CM | POA: Diagnosis not present

## 2018-12-17 DIAGNOSIS — N186 End stage renal disease: Secondary | ICD-10-CM | POA: Diagnosis not present

## 2018-12-17 DIAGNOSIS — N2581 Secondary hyperparathyroidism of renal origin: Secondary | ICD-10-CM | POA: Diagnosis not present

## 2018-12-19 DIAGNOSIS — N186 End stage renal disease: Secondary | ICD-10-CM | POA: Diagnosis not present

## 2018-12-19 DIAGNOSIS — N2581 Secondary hyperparathyroidism of renal origin: Secondary | ICD-10-CM | POA: Diagnosis not present

## 2018-12-22 DIAGNOSIS — N2581 Secondary hyperparathyroidism of renal origin: Secondary | ICD-10-CM | POA: Diagnosis not present

## 2018-12-22 DIAGNOSIS — N186 End stage renal disease: Secondary | ICD-10-CM | POA: Diagnosis not present

## 2018-12-23 ENCOUNTER — Ambulatory Visit: Payer: Medicare Other | Admitting: Obstetrics & Gynecology

## 2018-12-23 DIAGNOSIS — Z992 Dependence on renal dialysis: Secondary | ICD-10-CM | POA: Diagnosis not present

## 2018-12-23 DIAGNOSIS — N186 End stage renal disease: Secondary | ICD-10-CM | POA: Diagnosis not present

## 2018-12-24 ENCOUNTER — Emergency Department (HOSPITAL_COMMUNITY): Payer: Medicare Other

## 2018-12-24 ENCOUNTER — Other Ambulatory Visit: Payer: Self-pay

## 2018-12-24 ENCOUNTER — Encounter (HOSPITAL_COMMUNITY): Payer: Self-pay | Admitting: Emergency Medicine

## 2018-12-24 ENCOUNTER — Emergency Department (HOSPITAL_COMMUNITY)
Admission: EM | Admit: 2018-12-24 | Discharge: 2018-12-25 | Disposition: A | Discharge status: Other Acute Inpatient Hospital | Payer: Medicare Other | Attending: Emergency Medicine | Admitting: Emergency Medicine

## 2018-12-24 DIAGNOSIS — F309 Manic episode, unspecified: Secondary | ICD-10-CM | POA: Diagnosis not present

## 2018-12-24 DIAGNOSIS — F209 Schizophrenia, unspecified: Secondary | ICD-10-CM | POA: Insufficient documentation

## 2018-12-24 DIAGNOSIS — Z79899 Other long term (current) drug therapy: Secondary | ICD-10-CM | POA: Insufficient documentation

## 2018-12-24 DIAGNOSIS — R4182 Altered mental status, unspecified: Secondary | ICD-10-CM | POA: Insufficient documentation

## 2018-12-24 DIAGNOSIS — I10 Essential (primary) hypertension: Secondary | ICD-10-CM | POA: Diagnosis not present

## 2018-12-24 DIAGNOSIS — F3010 Manic episode without psychotic symptoms, unspecified: Secondary | ICD-10-CM | POA: Insufficient documentation

## 2018-12-24 DIAGNOSIS — Z046 Encounter for general psychiatric examination, requested by authority: Secondary | ICD-10-CM | POA: Diagnosis not present

## 2018-12-24 DIAGNOSIS — D631 Anemia in chronic kidney disease: Secondary | ICD-10-CM | POA: Diagnosis not present

## 2018-12-24 DIAGNOSIS — G9389 Other specified disorders of brain: Secondary | ICD-10-CM | POA: Diagnosis not present

## 2018-12-24 DIAGNOSIS — N2581 Secondary hyperparathyroidism of renal origin: Secondary | ICD-10-CM | POA: Diagnosis not present

## 2018-12-24 DIAGNOSIS — Z992 Dependence on renal dialysis: Secondary | ICD-10-CM | POA: Insufficient documentation

## 2018-12-24 DIAGNOSIS — F29 Unspecified psychosis not due to a substance or known physiological condition: Secondary | ICD-10-CM | POA: Diagnosis not present

## 2018-12-24 DIAGNOSIS — N183 Chronic kidney disease, stage 3 (moderate): Secondary | ICD-10-CM | POA: Diagnosis not present

## 2018-12-24 DIAGNOSIS — R9431 Abnormal electrocardiogram [ECG] [EKG]: Secondary | ICD-10-CM | POA: Diagnosis not present

## 2018-12-24 DIAGNOSIS — N186 End stage renal disease: Secondary | ICD-10-CM | POA: Diagnosis not present

## 2018-12-24 DIAGNOSIS — I700 Atherosclerosis of aorta: Secondary | ICD-10-CM | POA: Diagnosis not present

## 2018-12-24 LAB — COMPREHENSIVE METABOLIC PANEL
ALT: 18 U/L (ref 0–44)
AST: 33 U/L (ref 15–41)
Albumin: 3.9 g/dL (ref 3.5–5.0)
Alkaline Phosphatase: 78 U/L (ref 38–126)
Anion gap: 14 (ref 5–15)
BUN: 15 mg/dL (ref 8–23)
CO2: 30 mmol/L (ref 22–32)
Calcium: 9.5 mg/dL (ref 8.9–10.3)
Chloride: 92 mmol/L — ABNORMAL LOW (ref 98–111)
Creatinine, Ser: 4.39 mg/dL — ABNORMAL HIGH (ref 0.44–1.00)
GFR calc Af Amer: 11 mL/min — ABNORMAL LOW (ref >60)
GFR calc non Af Amer: 10 mL/min — ABNORMAL LOW (ref >60)
Glucose, Bld: 89 mg/dL (ref 70–99)
Potassium: 4 mmol/L (ref 3.5–5.1)
Sodium: 136 mmol/L (ref 135–145)
Total Bilirubin: 0.7 mg/dL (ref 0.3–1.2)
Total Protein: 8.3 g/dL — ABNORMAL HIGH (ref 6.5–8.1)

## 2018-12-24 LAB — TROPONIN I: Troponin I: <0.03 ng/mL (ref <0.03)

## 2018-12-24 LAB — CBC WITH DIFFERENTIAL/PLATELET
Abs Immature Granulocytes: 0.01 10*3/uL (ref 0.00–0.07)
Basophils Absolute: 0 10*3/uL (ref 0.0–0.1)
Basophils Relative: 1 %
Eosinophils Absolute: 0 10*3/uL (ref 0.0–0.5)
Eosinophils Relative: 1 %
HCT: 34.5 % — ABNORMAL LOW (ref 36.0–46.0)
Hemoglobin: 10.9 g/dL — ABNORMAL LOW (ref 12.0–15.0)
Immature Granulocytes: 0 %
Lymphocytes Relative: 23 %
Lymphs Abs: 1.4 10*3/uL (ref 0.7–4.0)
MCH: 29.7 pg (ref 26.0–34.0)
MCHC: 31.6 g/dL (ref 30.0–36.0)
MCV: 94 fL (ref 80.0–100.0)
Monocytes Absolute: 0.6 10*3/uL (ref 0.1–1.0)
Monocytes Relative: 10 %
Neutro Abs: 4 10*3/uL (ref 1.7–7.7)
Neutrophils Relative %: 65 %
Platelets: 218 10*3/uL (ref 150–400)
RBC: 3.67 MIL/uL — ABNORMAL LOW (ref 3.87–5.11)
RDW: 16.9 % — ABNORMAL HIGH (ref 11.5–15.5)
WBC: 6.2 10*3/uL (ref 4.0–10.5)
nRBC: 0 % (ref 0.0–0.2)

## 2018-12-24 LAB — PROTIME-INR
INR: 0.9 (ref 0.8–1.2)
Prothrombin Time: 12.2 seconds (ref 11.4–15.2)

## 2018-12-24 LAB — ACETAMINOPHEN LEVEL: Acetaminophen (Tylenol), Serum: <10 ug/mL — ABNORMAL LOW (ref 10–30)

## 2018-12-24 LAB — ETHANOL: Alcohol, Ethyl (B): <10 mg/dL (ref <10)

## 2018-12-24 LAB — SALICYLATE LEVEL: Salicylate Lvl: <7 mg/dL (ref 2.8–30.0)

## 2018-12-24 MED ORDER — MIRTAZAPINE 15 MG PO TABS
30.0000 mg | ORAL_TABLET | Freq: Every day | ORAL | Status: DC
  Administered 2018-12-25: 30 mg via ORAL
  Filled 2018-12-24: qty 2

## 2018-12-24 MED ORDER — PANTOPRAZOLE SODIUM 40 MG PO TBEC
40.0000 mg | DELAYED_RELEASE_TABLET | Freq: Every day | ORAL | Status: DC
  Administered 2018-12-25: 40 mg via ORAL
  Filled 2018-12-24: qty 1

## 2018-12-24 MED ORDER — VITAMIN B-1 100 MG PO TABS
50.0000 mg | ORAL_TABLET | Freq: Every day | ORAL | Status: DC
  Administered 2018-12-25: 50 mg via ORAL
  Filled 2018-12-24: qty 1

## 2018-12-24 MED ORDER — GABAPENTIN 300 MG PO CAPS
300.0000 mg | ORAL_CAPSULE | Freq: Every day | ORAL | Status: DC
  Administered 2018-12-25: 300 mg via ORAL
  Filled 2018-12-24: qty 1

## 2018-12-24 MED ORDER — ADULT MULTIVITAMIN W/MINERALS CH
1.0000 | ORAL_TABLET | Freq: Every day | ORAL | Status: DC
  Administered 2018-12-25: 1 via ORAL
  Filled 2018-12-24: qty 1

## 2018-12-24 NOTE — ED Notes (Signed)
Patient is non-stop talking to self in room.

## 2018-12-24 NOTE — ED Provider Notes (Signed)
[facility] EMERGENCY DEPARTMENT Provider Note   CSN: [id number] Arrival date & time: 01/19/2019  2131    History   Chief Complaint Chief Complaint  Patient presents with  . Psychiatric Evaluation    HPI Madison [name] is a 68 y.o. female.     Level 5 caveat for psychiatric illness.  Patient brought in by EMS with concern for manic episode, bizarre behavior, pressured speech, flight of ideas.  Patient believes she is here because her "sister wants me locked up".  Patient is oriented to person and place as well as the year 2020 but she believes it is March.  She denies any pain.  She denies any fevers, chills, nausea, vomiting.  She denies any suicidal or homicidal thoughts.  She denies any hallucinations.  Collateral information obtained from patient's Sister [name] whom she lives with.  Her cell phone number is [phone].  [name] states that her mother died on 2023-01-07 and the patient has declined since in terms of her psychiatric illness.  Patient does not want to take her medications though she did take them today.  She is had difficulty with eating and drinking them and wandering around the house and having pressured speech and flight of ideas.  Patient did have dialysis today and normally goes Monday Wednesday Friday.  She has no other medical problems.  The history is provided by the patient and the EMS personnel. The history is limited by the condition of the patient.    Past Medical History:  Diagnosis Date  . Anxiety   . ESRD (end stage renal disease) on dialysis ([place])   . Neuropathy   . Schizophrenia [facility])     Patient Active Problem List   Diagnosis Date Noted  . History of adenomatous polyp of colon 11/20/2018  . Loss of weight 10/17/2017  . Hypernatremia 04/17/2016  . CKD (chronic kidney disease) stage 3, GFR 30-59 ml/min (HCC) 04/16/2016  . Normocytic anemia 04/16/2016  . Meningioma ([place]) 04/16/2016  . Acute psychosis ([place]) 04/15/2016  . Hyponatremia  04/15/2016  . AKI (acute kidney injury) ([place]) 04/15/2016  . Special screening for malignant neoplasms, colon   . Thrombocytopenia ([place]) 01/05/2016  . Altered mental status 04/19/2015    Past Surgical History:  Procedure Laterality Date  . CHOLECYSTECTOMY    . COLONOSCOPY N/A 01/20/2016   Dr. Oneida Alar: 12 mm tubular adenoma removed from the transverse colon, 4 hyperplastic polyps removed from the rectum and sigmoid colon.  Next colonoscopy planned for April 2020.  Marland Kitchen ESOPHAGOGASTRODUODENOSCOPY N/A 12/19/2017   Procedure: ESOPHAGOGASTRODUODENOSCOPY (EGD);  Surgeon: [name], MD;  Location[facility];  Service: Endoscopy;  Laterality: N/A;  8:30am  . fistula left arm    . GIVENS CAPSULE STUDY N/A 01/14/2018   Procedure: GIVENS CAPSULE STUDY;  Surgeon: [name], MD;  Location: [facility];  Service: Endoscopy;  Laterality: N/A;  7:30am  . HEMICOLECTOMY Right   . TUBAL LIGATION       OB History    Gravida  0   Para  0   Term  0   Preterm  0   AB  0   Living  0     SAB  0   TAB  0   Ectopic  0   Multiple  0   Live Births  0            Home Medications    Prior to Admission medications   Medication Sig Start Date  End Date Taking? Authorizing Provider  ferric citrate (AURYXIA) 1 GM 210 MG(Fe) tablet Take 210 mg by mouth 3 (three) times daily with meals.     [provider]  gabapentin (NEURONTIN) 300 MG capsule Take 300 mg by mouth at bedtime.     [provider]  metroNIDAZOLE (FLAGYL) 500 MG tablet Take 1 tablet (500 mg total) by mouth 2 (two) times daily. 12/02/18   Florian Buff, MD  mirtazapine (REMERON) 30 MG tablet Take 30 mg by mouth at bedtime.     [provider]  Multiple Vitamin (MULTIVITAMIN) tablet Take 1 tablet by mouth daily.    [provider]  OLANZapine (ZYPREXA) 15 MG tablet Take 15 mg by mouth at bedtime.     [provider]  pantoprazole (PROTONIX) 40 MG tablet TAKE 1 TABLET BY MOUTH  TWICE DAILY 30 MINUTES BEFORE MEALS FOR 3 MONTHS THEN DECREASE TO 1 TABLET DAILY. 10/02/18   Carlis Stable, NP  thiamine (VITAMIN B-1) 100 MG tablet Take 50 mg by mouth daily.     [provider]    Family History Family History  Problem Relation Age of Onset  . Alzheimer's disease Mother   . Cancer Mother        pancreatic  . Prostate cancer Father   . Kidney failure Sister   . Breast cancer Neg Hx   . Colon cancer Neg Hx     Social History Social History   Tobacco Use  . Smoking status: Never Smoker  . Smokeless tobacco: Never Used  Substance Use Topics  . Alcohol use: No  . Drug use: No     Allergies   Patient has no known allergies.   Review of Systems Review of Systems  Constitutional: Positive for appetite change. Negative for activity change and fatigue.  HENT: Negative for congestion and sore throat.   Respiratory: Negative for chest tightness and shortness of breath.   Gastrointestinal: Negative for abdominal pain, nausea and vomiting.  Genitourinary: Negative for dysuria and hematuria.  Musculoskeletal: Negative for arthralgias and myalgias.  Neurological: Negative for dizziness, weakness, light-headedness and headaches.  Psychiatric/Behavioral: Positive for behavioral problems and decreased concentration. Negative for sleep disturbance and suicidal ideas. The patient is nervous/anxious and is hyperactive.     all other systems are negative except as noted in the HPI and PMH.    Physical Exam Updated Vital Signs BP (!) 170/70 (BP Location: Right Arm)   Pulse (!) 107   Temp 98.6 F (37 C) (Oral)   Resp 18   Ht 5\' 4"  (1.626 m)   Wt 56.7 kg   SpO2 100%   BMI 21.46 kg/m   Physical Exam Vitals signs and nursing note reviewed.  Constitutional:      General: She is not in acute distress.    Appearance: She is well-developed.     Comments: Calm and cooperative  HENT:     Head: Normocephalic and atraumatic.     Mouth/Throat:     Pharynx: No  oropharyngeal exudate.     Comments: Mildly dry mucous membranes Eyes:     Conjunctiva/sclera: Conjunctivae normal.     Pupils: Pupils are equal, round, and reactive to light.  Neck:     Musculoskeletal: Normal range of motion and neck supple.     Comments: No meningismus. Cardiovascular:     Rate and Rhythm: Normal rate and regular rhythm.     Heart sounds: Normal heart sounds. No murmur.  Pulmonary:  Effort: Pulmonary effort is normal. No respiratory distress.     Breath sounds: Normal breath sounds.  Abdominal:     Palpations: Abdomen is soft.     Tenderness: There is no abdominal tenderness. There is no guarding or rebound.  Musculoskeletal: Normal range of motion.        General: No tenderness.     Comments: Dialysis fistula left upper extremity with thrill and bruit  Skin:    General: Skin is warm.     Capillary Refill: Capillary refill takes less than 2 seconds.  Neurological:     General: No focal deficit present.     Mental Status: She is alert and oriented to person, place, and time.     Cranial Nerves: No cranial nerve deficit.     Motor: No abnormal muscle tone.     Coordination: Coordination normal.     Comments:  5/5 strength throughout. CN 2-12 intact.Equal grip strength.  No asterixis  Psychiatric:        Behavior: Behavior normal.     Comments: Rapid and pressured speech, flight of ideas.  Denies suicidal or homicidal thoughts.  Denies hallucinations or hearing voices.      ED Treatments / Results  Labs (all labs ordered are listed, but only abnormal results are displayed) Labs Reviewed  ACETAMINOPHEN LEVEL - Abnormal; Notable for the following components:      Result Value   Acetaminophen (Tylenol), Serum <10 (*)    All other components within normal limits  COMPREHENSIVE METABOLIC PANEL - Abnormal; Notable for the following components:   Chloride 92 (*)    Creatinine, Ser 4.39 (*)    Total Protein 8.3 (*)    GFR calc non Af Amer 10 (*)    GFR  calc Af Amer 11 (*)    All other components within normal limits  CBC WITH DIFFERENTIAL/PLATELET - Abnormal; Notable for the following components:   RBC 3.67 (*)    Hemoglobin 10.9 (*)    HCT 34.5 (*)    RDW 16.9 (*)    All other components within normal limits  AMMONIA - Abnormal; Notable for the following components:   Ammonia 39 (*)    All other components within normal limits  URINE CULTURE  ETHANOL  SALICYLATE LEVEL  TROPONIN I  PROTIME-INR  TSH  RAPID URINE DRUG SCREEN, HOSP PERFORMED  URINALYSIS, ROUTINE W REFLEX MICROSCOPIC    EKG EKG Interpretation  Date/Time:  Wednesday December 24 2018 22:05:24 EDT Ventricular Rate:  95 PR Interval:  134 QRS Duration: 102 QT Interval:  382 QTC Calculation: 480 R Axis:   -21 Text Interpretation:  Normal sinus rhythm Left ventricular hypertrophy Abnormal ECG No significant change was found Confirmed by Ezequiel Essex [phone] on 01/17/2019 11:09:45 PM   Radiology Dg Chest 2 View  Result Date: 01/21/2019 CLINICAL DATA:  Initial evaluation for acute altered mental status. EXAM: CHEST - 2 VIEW COMPARISON:  Prior radiograph from 04/15/2016. FINDINGS: Cardiac and mediastinal silhouettes are within normal limits. Aortic atherosclerosis noted. Lungs normally inflated. No focal infiltrates. No pulmonary edema or pleural effusion. No pneumothorax. Nodular density overlying the left lung base most consistent with a nipple shadow. No acute osseous abnormality. Thoracolumbar scoliosis noted. Vascular stent overlies the upper left arm. IMPRESSION: 1. No active cardiopulmonary disease. 2. Aortic atherosclerosis. Electronically Signed   By: Jeannine Boga M.D.   On: 01/19/2019 22:54   Ct Head Wo Contrast  Result Date: 12/25/2018 CLINICAL DATA:  Altered mental status.  History  of meningioma. EXAM: CT HEAD WITHOUT CONTRAST TECHNIQUE: Contiguous axial images were obtained from the base of the skull through the vertex without intravenous contrast.  COMPARISON:  CT HEAD August 16, 2017 FINDINGS: BRAIN: No intraparenchymal hemorrhage, mass effect nor midline shift. Moderate parenchymal brain volume loss. No hydrocephalus. Patchy supratentorial white matter hypodensities within normal range for patient's age, though non-specific are most compatible with chronic small vessel ischemic disease. No acute large vascular territory infarcts. No abnormal extra-axial fluid collections. Basal cisterns are patent. Stable 2 cm RIGHT parafalcine convexity partially calcified meningioma. Anterior falx calcifications. VASCULAR: Minimal calcific atherosclerosis of the carotid siphons. SKULL: No skull fracture. No significant scalp soft tissue swelling. SINUSES/ORBITS: Trace paranasal sinus mucosal thickening. Mastoid air cells are well aerated.The included ocular globes and orbital contents are non-suspicious. OTHER: None. IMPRESSION: 1. No acute intracranial process. 2. Moderate parenchymal brain volume loss, advanced for age. 3. Stable 2 cm RIGHT parafalcine meningioma without mass effect. Electronically Signed   By: Elon Alas M.D.   On: 01/05/2019 23:07    Procedures Procedures (including critical care time)  Medications Ordered in ED Medications - No data to display   Initial Impression / Assessment and Plan / ED Course  I have reviewed the triage vital signs and the nursing notes.  Pertinent labs & imaging results that were available during my care of the patient were reviewed by me and considered in my medical decision making (see chart for details).       Patient with history of schizophrenia as well as bipolar disorder and ESRD on dialysis presenting with concern for manic episode.  She has rapid and pressured speech and has not been taking her medications at home.  She denies any suicidal or homicidal thoughts.  Patient continues to talk to herself and have rapid pressured speech and flight of ideas.  She is however oriented and calm and  cooperative.  Screening labs are reassuring.  Potassium was 4.0. She is not due for dialysis until April 3. Head CT is stable.  Patient is medically clear for psychiatric evaluation. Holding orders placed.  She will need a nephrology consult for routine dialysis if she still remains in the ED on 4/3.  Final Clinical Impressions(s) / ED Diagnoses   Final diagnoses:  Manic behavior [facility])    ED Discharge Orders    None       Ezequiel Essex, MD 12/25/18 [phone]

## 2018-12-24 NOTE — ED Triage Notes (Signed)
Patient brought in by [facility]. Family states that patient's behavior has been bizarre and that patient will not stop talking and ideas are disorganized. Family is concerned that patient may have had a psychotic break due to recent loss of her mother 2 weeks ago.

## 2018-12-25 ENCOUNTER — Other Ambulatory Visit: Payer: Self-pay | Admitting: Psychiatry

## 2018-12-25 ENCOUNTER — Other Ambulatory Visit: Payer: Self-pay

## 2018-12-25 ENCOUNTER — Inpatient Hospital Stay
Admission: AD | Admit: 2018-12-25 | Discharge: 2019-01-09 | DRG: 885 | Disposition: A | Discharge status: Home / Self Care | Payer: Medicare Other | Source: Intra-hospital | Attending: Psychiatry | Admitting: Psychiatry

## 2018-12-25 DIAGNOSIS — F319 Bipolar disorder, unspecified: Secondary | ICD-10-CM | POA: Diagnosis not present

## 2018-12-25 DIAGNOSIS — N186 End stage renal disease: Secondary | ICD-10-CM | POA: Diagnosis present

## 2018-12-25 DIAGNOSIS — E569 Vitamin deficiency, unspecified: Secondary | ICD-10-CM | POA: Diagnosis present

## 2018-12-25 DIAGNOSIS — F250 Schizoaffective disorder, bipolar type: Secondary | ICD-10-CM

## 2018-12-25 DIAGNOSIS — F419 Anxiety disorder, unspecified: Secondary | ICD-10-CM | POA: Diagnosis not present

## 2018-12-25 DIAGNOSIS — F312 Bipolar disorder, current episode manic severe with psychotic features: Principal | ICD-10-CM | POA: Diagnosis present

## 2018-12-25 DIAGNOSIS — Z841 Family history of disorders of kidney and ureter: Secondary | ICD-10-CM | POA: Diagnosis not present

## 2018-12-25 DIAGNOSIS — E875 Hyperkalemia: Secondary | ICD-10-CM | POA: Diagnosis not present

## 2018-12-25 DIAGNOSIS — D509 Iron deficiency anemia, unspecified: Secondary | ICD-10-CM | POA: Diagnosis present

## 2018-12-25 DIAGNOSIS — R4182 Altered mental status, unspecified: Secondary | ICD-10-CM | POA: Diagnosis not present

## 2018-12-25 DIAGNOSIS — N2581 Secondary hyperparathyroidism of renal origin: Secondary | ICD-10-CM | POA: Diagnosis not present

## 2018-12-25 DIAGNOSIS — R251 Tremor, unspecified: Secondary | ICD-10-CM | POA: Diagnosis not present

## 2018-12-25 DIAGNOSIS — I120 Hypertensive chronic kidney disease with stage 5 chronic kidney disease or end stage renal disease: Secondary | ICD-10-CM | POA: Diagnosis not present

## 2018-12-25 DIAGNOSIS — M797 Fibromyalgia: Secondary | ICD-10-CM | POA: Diagnosis not present

## 2018-12-25 DIAGNOSIS — D631 Anemia in chronic kidney disease: Secondary | ICD-10-CM | POA: Diagnosis not present

## 2018-12-25 DIAGNOSIS — N183 Chronic kidney disease, stage 3 (moderate): Secondary | ICD-10-CM | POA: Diagnosis not present

## 2018-12-25 DIAGNOSIS — K219 Gastro-esophageal reflux disease without esophagitis: Secondary | ICD-10-CM | POA: Diagnosis present

## 2018-12-25 DIAGNOSIS — Z79899 Other long term (current) drug therapy: Secondary | ICD-10-CM

## 2018-12-25 DIAGNOSIS — I10 Essential (primary) hypertension: Secondary | ICD-10-CM | POA: Diagnosis not present

## 2018-12-25 DIAGNOSIS — G8929 Other chronic pain: Secondary | ICD-10-CM | POA: Diagnosis present

## 2018-12-25 DIAGNOSIS — Z992 Dependence on renal dialysis: Secondary | ICD-10-CM | POA: Diagnosis not present

## 2018-12-25 DIAGNOSIS — R4781 Slurred speech: Secondary | ICD-10-CM | POA: Diagnosis not present

## 2018-12-25 DIAGNOSIS — F3010 Manic episode without psychotic symptoms, unspecified: Secondary | ICD-10-CM | POA: Diagnosis not present

## 2018-12-25 DIAGNOSIS — G629 Polyneuropathy, unspecified: Secondary | ICD-10-CM | POA: Diagnosis present

## 2018-12-25 DIAGNOSIS — F209 Schizophrenia, unspecified: Secondary | ICD-10-CM | POA: Diagnosis not present

## 2018-12-25 LAB — AMMONIA: Ammonia: 39 umol/L — ABNORMAL HIGH (ref 9–35)

## 2018-12-25 LAB — TSH: TSH: 3.331 u[IU]/mL (ref 0.350–4.500)

## 2018-12-25 MED ORDER — ALUM & MAG HYDROXIDE-SIMETH 200-200-20 MG/5ML PO SUSP: 30.0000 mL | ORAL | Status: DC | PRN

## 2018-12-25 MED ORDER — OLANZAPINE 10 MG PO TABS
20.0000 mg | ORAL_TABLET | Freq: Every day | ORAL | Status: DC
  Administered 2018-12-25 – 2019-01-08 (×15): 20 mg via ORAL
  Filled 2018-12-25 (×15): qty 2

## 2018-12-25 MED ORDER — ACETAMINOPHEN 325 MG PO TABS
650.0000 mg | ORAL_TABLET | Freq: Four times a day (QID) | ORAL | Status: DC | PRN
  Administered 2019-01-02 – 2019-01-03 (×3): 650 mg via ORAL
  Filled 2018-12-25 (×3): qty 2

## 2018-12-25 MED ORDER — GABAPENTIN 300 MG PO CAPS
300.0000 mg | ORAL_CAPSULE | Freq: Every day | ORAL | Status: DC
  Administered 2018-12-25 – 2019-01-03 (×10): 300 mg via ORAL
  Filled 2018-12-25 (×10): qty 1

## 2018-12-25 MED ORDER — VITAMIN B-1 100 MG PO TABS
50.0000 mg | ORAL_TABLET | Freq: Every day | ORAL | Status: DC
  Administered 2018-12-25 – 2019-01-09 (×16): 50 mg via ORAL
  Filled 2018-12-25 (×16): qty 1

## 2018-12-25 MED ORDER — PANTOPRAZOLE SODIUM 40 MG PO TBEC
40.0000 mg | DELAYED_RELEASE_TABLET | Freq: Every day | ORAL | Status: DC
  Administered 2018-12-25 – 2019-01-09 (×16): 40 mg via ORAL
  Filled 2018-12-25 (×16): qty 1

## 2018-12-25 MED ORDER — ADULT MULTIVITAMIN W/MINERALS CH
1.0000 | ORAL_TABLET | Freq: Every day | ORAL | Status: DC
  Administered 2018-12-25 – 2019-01-09 (×16): 1 via ORAL
  Filled 2018-12-25 (×17): qty 1

## 2018-12-25 MED ORDER — MAGNESIUM HYDROXIDE 400 MG/5ML PO SUSP
30.0000 mL | Freq: Every day | ORAL | Status: DC | PRN
  Filled 2018-12-25: qty 30

## 2018-12-25 MED ORDER — MIRTAZAPINE 15 MG PO TABS
30.0000 mg | ORAL_TABLET | Freq: Every day | ORAL | Status: DC
  Administered 2018-12-25 – 2019-01-03 (×10): 30 mg via ORAL
  Filled 2018-12-25 (×10): qty 2

## 2018-12-25 MED ORDER — OLANZAPINE 5 MG PO TABS: 15.0000 mg | ORAL_TABLET | Freq: Every day | ORAL | Status: DC

## 2018-12-25 NOTE — Plan of Care (Signed)
  Problem: Education: Goal: Knowledge of [facility] Education information/materials will improve Note:  Patient  unable to process information receive, staff continue to redirect.

## 2018-12-25 NOTE — ED Notes (Signed)
Attempted to call report to [place]. Per staff nurse will call back when available.

## 2018-12-25 NOTE — Tx Team (Signed)
Initial Treatment Plan 12/25/2018 4:44 PM Madison [name] ZOX:[id number]    PATIENT STRESSORS: Health problems Medication change or noncompliance   PATIENT STRENGTHS: Active sense of humor Supportive family/friends   PATIENT IDENTIFIED PROBLEMS: Psychosis 12/25/2018                     DISCHARGE CRITERIA:  Adequate post-discharge living arrangements Improved stabilization in mood, thinking, and/or behavior  PRELIMINARY DISCHARGE PLAN: Outpatient therapy Return to previous living arrangement  PATIENT/FAMILY INVOLVEMENT: This treatment plan has been presented to and reviewed with the patient, Madison [name], and/or family member,  .  The patient and family have been given the opportunity to ask questions and make suggestions.  Leodis Liverpool, RN 12/25/2018, 4:44 PM

## 2018-12-25 NOTE — ED Notes (Signed)
Patient's sister contact information: Karon Cotterill [phone]

## 2018-12-25 NOTE — ED Provider Notes (Addendum)
Blood pressure 103/69, pulse 86, temperature 98.8 F (37.1 C), temperature source Oral, resp. rate 18, height 5\' 4"  (1.626 m), weight 56.7 kg, SpO2 99 %.  Assuming care from Dr. Wyvonnia Dusky.  In short, Madison [name] is a 68 y.o. female with a chief complaint of Psychiatric Evaluation .  Refer to the original H&P for additional details.  Spoke with Nephrology this AM, Dr. Johnney Ou. They will plan on routine HD tomorrow. Appreciate assistance with case. Psychiatry placement pending.  11:47 AM  Patient accepted to [facility] Psychiatry service. Completed EMTALA documentation. Patient stable for transport.      Margette Fast, MD 12/25/18 [name]    Margette Fast, MD 12/25/18 [phone]

## 2018-12-25 NOTE — Progress Notes (Signed)
Bruit/ Thrill assessed , present no complication .

## 2018-12-25 NOTE — H&P (Signed)
Psychiatric Admission Assessment Adult  Patient Identification: Madison [name] MRN:  [id number] Date of Evaluation:  12/25/2018 Chief Complaint:  BIPOLAR CURRENT EPISODE MANIC WITH PSYCHOTIC FEATURES Principal Diagnosis: Bipolar affective disorder, current episode manic with psychotic symptoms ([place]) Diagnosis:  Principal Problem:   Bipolar affective disorder, current episode manic with psychotic symptoms ([place]) Active Problems:   End stage renal disease ([place])  History of Present Illness: Patient seen chart reviewed.  This is a 68 year old woman with a history of bipolar disorder who was referred to [place] from [facility] because she is on dialysis.  Patient was brought to the emergency room in [facility] by her sister because of several days of worsening agitation and confusion.  Apparently the patient's mother died 2 weeks ago and since that time the patient has been declining.  There are reports that she has been refusing medication.  The patient confirms that she has been "too busy" to take her medicine at times.  She apparently has not been sleeping well.  There is no report of violence and the patient denies any suicidal ideation.  On presentation the patient is showing pressured speech and very tangential thinking with flight of ideas.  She is able to stop and answer direct questions but quickly becomes derailed again.  Patient is denying any suicidal or homicidal thought.  Denies any current hallucinations.  Denies any recent alcohol or drug abuse.  Apparently she is still been seeing a psychiatrist outside the hospital and has been on a combination of Zyprexa and mirtazapine which appears to have been a stable medicine for a while now. Associated Signs/Symptoms: Depression Symptoms:  difficulty concentrating, (Hypo) Manic Symptoms:  Distractibility, Flight of Ideas, Impulsivity, Labiality of Mood, Anxiety Symptoms:  Excessive Worry, Psychotic Symptoms:  Paranoia, PTSD  Symptoms: Negative Total Time spent with patient: 1 hour  Past Psychiatric History: Patient has evidently had mental health problems most of her life.  She talks [facility] was [facility].  She was born with a twin sister who from what the patient is saying also had mental health problems.  Sister died several years ago which is documented as having been a huge stress for her.  Patient denies ever having tried to kill herself in the past.  No report of any violence to others.  Patient could not remember all the medicine she has taken in the past but at one point she started talking about lithium which makes me think may be that was used at some time in the past but recently it looks like Zyprexa and mirtazapine have been the main stays.  Is the patient at risk to self? Yes.    Has the patient been a risk to self in the past 6 months? No.  Has the patient been a risk to self within the distant past? Yes.    Is the patient a risk to others? No.  Has the patient been a risk to others in the past 6 months? No.  Has the patient been a risk to others within the distant past? No.   Prior Inpatient Therapy:   Prior Outpatient Therapy:    Alcohol Screening: 1. How often do you have a drink containing alcohol?: Never 2. How many drinks containing alcohol do you have on a typical day when you are drinking?: 1 or 2 3. How often do you have six or more drinks on one occasion?: Never AUDIT-C Score: 0 4. How often during the last  year have you found that you were not able to stop drinking once you had started?: Never 5. How often during the last year have you failed to do what was normally expected from you becasue of drinking?: Never 6. How often during the last year have you needed a first drink in the morning to get yourself going after a heavy drinking session?: Never 7. How often during the last year have you had a feeling of guilt of remorse after  drinking?: Never 8. How often during the last year have you been unable to remember what happened the night before because you had been drinking?: Never 9. Have you or someone else been injured as a result of your drinking?: No 10. Has a relative or friend or a doctor or another health worker been concerned about your drinking or suggested you cut down?: No Alcohol Use Disorder Identification Test Final Score (AUDIT): 0 Alcohol Brief Interventions/Follow-up: AUDIT Score <7 follow-up not indicated Substance Abuse History in the last 12 months:  No. Consequences of Substance Abuse: Negative Previous Psychotropic Medications: Yes  Psychological Evaluations: Yes  Past Medical History:  Past Medical History:  Diagnosis Date  . Anxiety   . ESRD (end stage renal disease) on dialysis ([place])   . Neuropathy   . Schizophrenia [facility])     Past Surgical History:  Procedure Laterality Date  . CHOLECYSTECTOMY    . COLONOSCOPY N/A 01/20/2016   Dr. Oneida Alar: 12 mm tubular adenoma removed from the transverse colon, 4 hyperplastic polyps removed from the rectum and sigmoid colon.  Next colonoscopy planned for April 2020.  Marland Kitchen ESOPHAGOGASTRODUODENOSCOPY N/A 12/19/2017   Procedure: ESOPHAGOGASTRODUODENOSCOPY (EGD);  Surgeon: [name], MD;  Location: [facility];  Service: Endoscopy;  Laterality: N/A;  8:30am  . fistula left arm    . GIVENS CAPSULE STUDY N/A 01/14/2018   Procedure: GIVENS CAPSULE STUDY;  Surgeon: [name], MD;  Location: [facility];  Service: Endoscopy;  Laterality: N/A;  7:30am  . HEMICOLECTOMY Right   . TUBAL LIGATION     Family History:  Family History  Problem Relation Age of Onset  . Alzheimer's disease Mother   . Cancer Mother        pancreatic  . Prostate cancer Father   . Kidney failure Sister   . Breast cancer Neg Hx   . Colon cancer Neg Hx    Family Psychiatric  History: From what the patient says it sounds like her twin sister also had mental health  problems. Tobacco Screening: Have you used any form of tobacco in the last 30 days? (Cigarettes, Smokeless Tobacco, Cigars, and/or Pipes): No Social History:  Social History   Substance and Sexual Activity  Alcohol Use No     Social History   Substance and Sexual Activity  Drug Use No    Additional Social History:                           Allergies:  No Known Allergies Lab Results:  Results for orders placed or performed during the hospital encounter of 01/19/2019 (from the past 48 hour(s))  Acetaminophen level     Status: Abnormal   Collection Time: 01/05/2019 10:26 PM  Result Value Ref Range   Acetaminophen (Tylenol), Serum <10 (L) 10 - 30 ug/mL    Comment: (NOTE) Therapeutic concentrations vary significantly. A range of 10-30 ug/mL  may be an effective concentration for many patients. However, some  are best treated at concentrations outside of this range. Acetaminophen concentrations >150 ug/mL at 4 hours after ingestion  and >50 ug/mL at 12 hours after ingestion are often associated with  toxic reactions. Performed at [facility], [address] [place], [place] [address]   Comprehensive metabolic panel     Status: Abnormal   Collection Time: 01/20/2019 10:26 PM  Result Value Ref Range   Sodium 136 135 - 145 mmol/L   Potassium 4.0 3.5 - 5.1 mmol/L   Chloride 92 (L) 98 - 111 mmol/L   CO2 30 22 - 32 mmol/L   Glucose, Bld 89 70 - 99 mg/dL   BUN 15 8 - 23 mg/dL   Creatinine, Ser 4.39 (H) 0.44 - 1.00 mg/dL   Calcium 9.5 8.9 - 10.3 mg/dL   Total Protein 8.3 (H) 6.5 - 8.1 g/dL   Albumin 3.9 3.5 - 5.0 g/dL   AST 33 15 - 41 U/L   ALT 18 0 - 44 U/L   Alkaline Phosphatase 78 38 - 126 U/L   Total Bilirubin 0.7 0.3 - 1.2 mg/dL   GFR calc non Af Amer 10 (L) >60 mL/min   GFR calc Af Amer 11 (L) >60 mL/min   Anion gap 14 5 - 15    Comment: Performed at [facility], [address], [place], [place] [address]  Ethanol     Status: None   Collection Time: 01/17/2019 10:26  PM  Result Value Ref Range   Alcohol, Ethyl (B) <10 <10 mg/dL    Comment: (NOTE) Lowest detectable limit for serum alcohol is 10 mg/dL. For medical purposes only. Performed at [facility], [address], [place], [place] [address]   Salicylate level     Status: None   Collection Time: 01/14/2019 10:26 PM  Result Value Ref Range   Salicylate Lvl <5.6 2.8 - 30.0 mg/dL    Comment: Performed at [facility], [address] [place], [place] [address]  Troponin I - Once     Status: None   Collection Time: 01/12/2019 10:26 PM  Result Value Ref Range   Troponin I <0.03 <0.03 ng/mL    Comment: Performed at [facility], [address] [place], [place] [address]  CBC with Differential     Status: Abnormal   Collection Time: 01/16/2019 10:26 PM  Result Value Ref Range   WBC 6.2 4.0 - 10.5 K/uL   RBC 3.67 (L) 3.87 - 5.11 MIL/uL   Hemoglobin 10.9 (L) 12.0 - 15.0 g/dL   HCT 34.5 (L) 36.0 - 46.0 %   MCV 94.0 80.0 - 100.0 fL   MCH 29.7 26.0 - 34.0 pg   MCHC 31.6 30.0 - 36.0 g/dL   RDW 16.9 (H) 11.5 - 15.5 %   Platelets 218 150 - 400 K/uL   nRBC 0.0 0.0 - 0.2 %   Neutrophils Relative % 65 %   Neutro Abs 4.0 1.7 - 7.7 K/uL   Lymphocytes Relative 23 %   Lymphs Abs 1.4 0.7 - 4.0 K/uL   Monocytes Relative 10 %   Monocytes Absolute 0.6 0.1 - 1.0 K/uL   Eosinophils Relative 1 %   Eosinophils Absolute 0.0 0.0 - 0.5 K/uL   Basophils Relative 1 %   Basophils Absolute 0.0 0.0 - 0.1 K/uL   Immature Granulocytes 0 %   Abs Immature Granulocytes 0.01 0.00 - 0.07 K/uL    Comment: Performed at [facility], [address] [place], [place] [address]  Protime-INR  Status: None   Collection Time: 01/06/2019 10:26 PM  Result Value Ref Range   Prothrombin Time 12.2 11.4 - 15.2 seconds   INR 0.9 0.8 - 1.2    Comment: (NOTE) INR goal varies based on device and disease states. Performed at [facility], [address] [place], [place] [address]   TSH     Status: None   Collection Time: 12/29/2018 10:26 PM   Result Value Ref Range   TSH 3.331 0.350 - 4.500 uIU/mL    Comment: Performed by a 3rd Generation assay with a functional sensitivity of <=0.01 uIU/mL. Performed at [facility], [address], [place], [place] [address]   Ammonia     Status: Abnormal   Collection Time: 01/05/2019 11:34 PM  Result Value Ref Range   Ammonia 39 (H) 9 - 35 umol/L    Comment: Performed at [facility], [address], [place], [place] [address]    Blood Alcohol level:  Lab Results  Component Value Date   ETH <10 01/07/2019   ETH <5 86/76/1951    Metabolic Disorder Labs:  No results found for: HGBA1C, MPG No results found for: PROLACTIN No results found for: CHOL, TRIG, HDL, CHOLHDL, VLDL, LDLCALC  Current Medications: Current Facility-Administered Medications  Medication Dose Route Frequency Provider Last Rate Last Dose  . acetaminophen (TYLENOL) tablet 650 mg  650 mg Oral Q6H PRN ,  T, MD      . alum & mag hydroxide-simeth (MAALOX/MYLANTA) 200-200-20 MG/5ML suspension 30 mL  30 mL Oral Q4H PRN ,  T, MD      . gabapentin (NEURONTIN) capsule 300 mg  300 mg Oral QHS ,  T, MD      . magnesium hydroxide (MILK OF MAGNESIA) suspension 30 mL  30 mL Oral Daily PRN ,  T, MD      . mirtazapine (REMERON) tablet 30 mg  30 mg Oral QHS ,  T, MD      . multivitamin with minerals tablet 1 tablet  1 tablet Oral Daily ,  T, MD      . OLANZapine (ZYPREXA) tablet 20 mg  20 mg Oral QHS ,  T, MD      . pantoprazole (PROTONIX) EC tablet 40 mg  40 mg Oral Daily ,  T, MD      . thiamine (VITAMIN B-1) tablet 50 mg  50 mg Oral Daily ,  T, MD       PTA Medications: Medications Prior to Admission  Medication Sig Dispense Refill Last Dose  . ferric citrate (AURYXIA) 1 GM 210 MG(Fe) tablet Take 210 mg by mouth 3 (three) times daily with meals.    Taking  . gabapentin (NEURONTIN) 300 MG capsule Take 300 mg by mouth at bedtime.     Taking  . metroNIDAZOLE (FLAGYL) 500 MG tablet Take 1 tablet (500 mg total) by mouth 2 (two) times daily. 14 tablet 1   . mirtazapine (REMERON) 30 MG tablet Take 30 mg by mouth at bedtime.    Taking  . Multiple Vitamin (MULTIVITAMIN) tablet Take 1 tablet by mouth daily.   Taking  . OLANZapine (ZYPREXA) 15 MG tablet Take 15 mg by mouth at bedtime.    Taking  . pantoprazole (PROTONIX) 40 MG tablet TAKE 1 TABLET BY MOUTH TWICE DAILY 30 MINUTES BEFORE MEALS FOR 3 MONTHS THEN DECREASE TO 1 TABLET DAILY. 56 tablet 3 Taking  . thiamine (VITAMIN B-1) 100 MG tablet Take 50 mg by mouth daily.  Taking    Musculoskeletal: Strength & Muscle Tone: within normal limits Gait & Station: normal Patient leans: N/A  Psychiatric Specialty Exam: Physical Exam  Nursing note and vitals reviewed. Constitutional: She appears well-developed and well-nourished.  HENT:  Head: Normocephalic and atraumatic.  Eyes: Pupils are equal, round, and reactive to light. Conjunctivae are normal.  Neck: Normal range of motion.  Cardiovascular: Regular rhythm and normal heart sounds.  Respiratory: Effort normal. No respiratory distress.  GI: Soft.  Musculoskeletal: Normal range of motion.  Neurological: She is alert.  Skin: Skin is warm and dry.  Psychiatric: Her mood appears anxious. Her speech is rapid and/or pressured. She is not agitated. Thought content is paranoid. Cognition and memory are impaired. She expresses impulsivity. She expresses no homicidal and no suicidal ideation.    Review of Systems  Constitutional: Negative.   HENT: Negative.   Eyes: Negative.   Respiratory: Negative.   Cardiovascular: Negative.   Gastrointestinal: Negative.   Musculoskeletal: Negative.   Skin: Negative.   Neurological: Negative.   Psychiatric/Behavioral: Negative for depression, hallucinations, memory loss, substance abuse and suicidal ideas. The patient is nervous/anxious and has insomnia.     Blood pressure 131/70, pulse  98, temperature 98.8 F (37.1 C), temperature source Oral, resp. rate 18, height 5\' 4"  (1.626 m), weight 57.2 kg, SpO2 98 %.Body mass index is 21.63 kg/m.  General Appearance: Casual  Eye Contact:  Good  Speech:  Pressured  Volume:  Increased  Mood:  Anxious  Affect:  Constricted  Thought Process:  Disorganized  Orientation:  Full (Time, Place, and Person)  Thought Content:  Illogical, Paranoid Ideation, Rumination and Tangential  Suicidal Thoughts:  No  Homicidal Thoughts:  No  Memory:  Immediate;   Fair Recent;   Fair Remote;   Fair  Judgement:  Impaired  Insight:  Shallow  Psychomotor Activity:  Restlessness  Concentration:  Concentration: Poor  Recall:  Poor  Fund of Knowledge:  Fair  Language:  Fair  Akathisia:  No  Handed:  Right  AIMS (if indicated):     Assets:  Desire for Improvement Financial Resources/Insurance Housing Resilience Social Support  ADL's:  Impaired  Cognition:  Impaired,  Mild  Sleep:       Treatment Plan Summary: Daily contact with patient to assess and evaluate symptoms and progress in treatment, Medication management and Plan 68 year old woman with bipolar or schizoaffective disorder who has decompensated in the last couple weeks.  Probably in part because of the stress of her mother's passing away and in part because of some medicine noncompliance.  Patient presents with symptoms of psychotic mania but is calm here and has been cooperative with treatment.  Patient also has end-stage renal disease and is on chronic hemodialysis.  As far as the dialysis I will contact the nephrology team first thing tomorrow morning to make sure she gets on her regular dialysis schedule.  For her mental health condition I am going to continue or restart Zyprexa at 20 mg at night.  This was closer to her previous dosage.  The mirtazapine is only 30 mg for now and we will leave it there.  She will get to be assessed by the treatment team and we will try to get in touch  with her sister who is her current caretaker.  Patient will be engaged in regular daily individual and group therapy and assessment.  Observation Level/Precautions:  15 minute checks  Laboratory:  UA  Psychotherapy:    Medications:  Consultations:    Discharge Concerns:    Estimated LOS:  Other:     Physician Treatment Plan for Primary Diagnosis: Bipolar affective disorder, current episode manic with psychotic symptoms ([place]) Long Term Goal(s): Improvement in symptoms so as ready for discharge  Short Term Goals: Ability to verbalize feelings will improve, Ability to demonstrate self-control will improve and Ability to identify and develop effective coping behaviors will improve  Physician Treatment Plan for Secondary Diagnosis: Principal Problem:   Bipolar affective disorder, current episode manic with psychotic symptoms ([place]) Active Problems:   End stage renal disease ([place])  Long Term Goal(s): Improvement in symptoms so as ready for discharge  Short Term Goals: Ability to maintain clinical measurements within normal limits will improve and Compliance with prescribed medications will improve  I certify that inpatient services furnished can reasonably be expected to improve the patient's condition.    Alethia Berthold, MD 4/2/20205:10 PM

## 2018-12-25 NOTE — BH Assessment (Signed)
Tele Assessment Note   Patient Name: Madison [name] MRN: [id number] Referring Physician: Dr. Ezequiel Essex Location of [facility]: [facility] Location of Provider: [place] is an 68 y.o. female presenting with manic episode, bizarre behavior, pressured speech, flight of ideas. Patient denied SI, HI and psychosis. Patient reported she is here because her "sister wants me locked up". Family is concerned that patient may have had a psychotic break due to recent loss of her mother 2 weeks ago. Throughout assessment patient was displayed rapid speech, hyper verbal and flight of ideas. Clinician was unable to assess patients answers due to mental status. Patient talked nonstop with disorganized ideas.   PER ED PROVIDER NOTE: Collateral information obtained from patient's Sister [name] whom she lives with.  Her cell phone number is [phone].  [name] states that her mother died on 12-14-22 and the patient has declined since in terms of her psychiatric illness.  Patient does not want to take her medications though she did take them today.  She is had difficulty with eating and drinking them and wandering around the house and having pressured speech and flight of ideas. Patient did have dialysis today and normally goes Monday Wednesday Friday.    UDS pending ETOH negative  Diagnosis: hx of schizophrenia  Past Medical History:  Past Medical History:  Diagnosis Date  . Anxiety   . ESRD (end stage renal disease) on dialysis ([place])   . Neuropathy   . Schizophrenia [facility])     Past Surgical History:  Procedure Laterality Date  . CHOLECYSTECTOMY    . COLONOSCOPY N/A 01/20/2016   Dr. Oneida Alar: 12 mm tubular adenoma removed from the transverse colon, 4 hyperplastic polyps removed from the rectum and sigmoid colon.  Next colonoscopy planned for April 2020.  Marland Kitchen ESOPHAGOGASTRODUODENOSCOPY N/A 12/19/2017   Procedure: ESOPHAGOGASTRODUODENOSCOPY (EGD);  Surgeon: [name], MD;  [facility];  Service: Endoscopy;  Laterality: N/A;  8:30am  . fistula left arm    . GIVENS CAPSULE STUDY N/A 01/14/2018   Procedure: GIVENS CAPSULE STUDY;  Surgeon: [name], MD;  [facility];  Service: Endoscopy;  Laterality: N/A;  7:30am  . HEMICOLECTOMY Right   . TUBAL LIGATION      Family History:  Family History  Problem Relation Age of Onset  . Alzheimer's disease Mother   . Cancer Mother        pancreatic  . Prostate cancer Father   . Kidney failure Sister   . Breast cancer Neg Hx   . Colon cancer Neg Hx     Social History:  reports that she has never smoked. She has never used smokeless tobacco. She reports that she does not drink alcohol or use drugs.  Additional Social History:  Alcohol / Drug Use Pain Medications: see MAR Prescriptions: see MAR Over the Counter: see MAR  CIWA: CIWA-Ar BP: (!) 170/70 Pulse Rate: (!) 107 COWS:    Allergies: No Known Allergies  Home Medications: (Not in a hospital admission)   OB/GYN Status:  No LMP recorded. Patient has had a hysterectomy.  General Assessment Data [facility] of Assessment: [facility] TTS Assessment: In system Is this a Tele or Face-to-Face Assessment?: Tele Assessment Is this an Initial Assessment or a Re-assessment for this encounter?: Initial Assessment Patient Accompanied by:: N/A Language Other than English: No Living Arrangements: (family home) What gender do you identify as?: Female Marital status: Single Pregnancy Status: Unknown Living Arrangements: (mother  and sister) Can pt return to current living arrangement?: Yes Admission Status: Involuntary Petitioner: Family member Is patient capable of signing voluntary admission?: No Referral Source: Self/Family/Friend  Crisis Care Plan Living Arrangements: (mother and sister) Legal Guardian: (self) Name of Psychiatrist: (none) Name of Therapist: (none)  Education Status Is patient currently in school?: No Is  the patient employed, unemployed or receiving disability?: (unable to assess)  Risk to self with the past 6 months Suicidal Ideation: No Has patient been a risk to self within the past 6 months prior to admission? : No Suicidal Intent: No Has patient had any suicidal intent within the past 6 months prior to admission? : No Is patient at risk for suicide?: No Suicidal Plan?: No Has patient had any suicidal plan within the past 6 months prior to admission? : No Access to Means: No What has been your use of drugs/alcohol within the last 12 months?: (unable to assess) Previous Attempts/Gestures: No How many times?: (0) Other Self Harm Risks: (unable to assess) Triggers for Past Attempts: Unknown Intentional Self Injurious Behavior: (unable to assess) Family Suicide History: Unable to assess Recent stressful life event(s): (unable to assess) Persecutory voices/beliefs?: (unable to assess) Depression: (unable to assess) Depression Symptoms: (unable to assess) Substance abuse history and/or treatment for substance abuse?: (unable to assess) Suicide prevention information given to non-admitted patients: Not applicable  Risk to Others within the past 6 months Homicidal Ideation: No Does patient have any lifetime risk of violence toward others beyond the six months prior to admission? : No Thoughts of Harm to Others: No Current Homicidal Intent: No Current Homicidal Plan: No Access to Homicidal Means: No Identified Victim: (n/a) History of harm to others?: No Assessment of Violence: None Noted Violent Behavior Description: (unable to assess) Does patient have access to weapons?: (unable to assess) Criminal Charges Pending?: No Does patient have a court date: No Is patient on probation?: No  Psychosis Hallucinations: (unable to assess) Delusions: (unable to assess)  Mental Status Report Appearance/Hygiene: Unremarkable Eye Contact: Poor Motor Activity: Unremarkable Speech:  Rapid, Pressured Level of Consciousness: Alert Mood: Anxious, Irritable Affect: Anxious, Irritable Anxiety Level: Moderate Thought Processes: Irrelevant, Flight of Ideas Judgement: Impaired Orientation: Unable to assess Obsessive Compulsive Thoughts/Behaviors: Unable to Assess  Cognitive Functioning Concentration: Poor Memory: Unable to Assess Is patient IDD: No Insight: Unable to Assess Impulse Control: Unable to Assess Appetite: Fair Have you had any weight changes? : No Change Sleep: No Change Total Hours of Sleep: (unable to assess) Vegetative Symptoms: Unable to Assess  ADLScreening [facility] Assessment Services) Patient's cognitive ability adequate to safely complete daily activities?: Yes Patient able to express need for assistance with ADLs?: Yes Independently performs ADLs?: Yes (appropriate for developmental age)  Prior Inpatient Therapy Prior Inpatient Therapy: (unable to assess)  Prior Outpatient Therapy Prior Outpatient Therapy: (unable to assess)  ADL Screening (condition at time of admission) Patient's cognitive ability adequate to safely complete daily activities?: Yes Patient able to express need for assistance with ADLs?: Yes Independently performs ADLs?: Yes (appropriate for developmental age)  Advance Directives (For Healthcare) Does Patient Have a Medical Advance Directive?: No  Disposition:  Disposition Initial Assessment Completed for this Encounter: Yes  Patriciaann Clan, PA, patient meets inpatient criteria for geropysch. TTS to secure placement. RN informed of disposition.      This service was provided via telemedicine using a 2-way, interactive audio and video technology.  Names of all persons participating in this telemedicine service and their role in this encounter.  Name: Alexie Lanni Role: Patient  Name: Kirtland Bouchard, [facility] Role: TTS Clinician  Name:  Role:   Name:  Role:     Venora Maples, [facility] 12/25/2018 1:12 AM

## 2018-12-25 NOTE — Progress Notes (Signed)
Admission Note: Report   D: Pt appeared depressed  With  a flat affect.  Pt  denies SI / AVH at this time.68 year old black female in under the Services of Dr. Weber Cooks . Presenting with manic episode and bizarre behavior, pressure speech, flight ideas.  Patient repeated saying her sister wants her locked up. Voice of her   mother died March 17, Non-compliant with medication following the death. Patient has a fistula in left arm. Dialysis treatments Monday Wednesday and Friday. Patient has end-stage renal disease. Patient has a history of schizophrenia.  Pt is redirectable and cooperative with assessment.      A: Pt admitted to unit per protocol, skin assessment and search done and no contraband found with Gigi RN.  Pt  educated on therapeutic milieu rules. Pt was introduced to milieu by nursing staff.    R: Pt was receptive to education about the milieu .  15 min safety checks started. Probation officer offered support

## 2018-12-25 NOTE — BH Assessment (Signed)
Patient has been accepted to [facility].  Accepting physician is Dr. Weber Cooks.  Attending Physician will be Dr. Weber Cooks.  Patient has been assigned to room 325, by [place].  Call report to [phone].  Representative/Transfer Coordinator is Mendel Ryder [facility]. Patient pre-admitted by [facility] Patient Access [facility])   Romie Minus, CSW made aware of acceptance.

## 2018-12-25 NOTE — Consult Note (Signed)
  Psychiatry: Patient was referred for transfer to [facility] psychiatry ward.  Chart reviewed.  Patient appears to be appropriate.  Discharge readmission orders have been completed.

## 2018-12-25 NOTE — ED Notes (Signed)
Pt states with her chronic renal disease she does not have urine output

## 2018-12-25 NOTE — BHH Group Notes (Signed)
LCSW Group Therapy Note  12/25/2018 1:57 PM  Type of Therapy/Topic:  Group Therapy:  Balance in Life  Participation Level:  Did Not Attend  Description of Group:    This group will address the concept of balance and how it feels and looks when one is unbalanced. Patients will be encouraged to process areas in their lives that are out of balance and identify reasons for remaining unbalanced. Facilitators will guide patients in utilizing problem-solving interventions to address and correct the stressor making their life unbalanced. Understanding and applying boundaries will be explored and addressed for obtaining and maintaining a balanced life. Patients will be encouraged to explore ways to assertively make their unbalanced needs known to significant others in their lives, using other group members and facilitator for support and feedback.  Therapeutic Goals: 1. Patient will identify two or more emotions or situations they have that consume much of in their lives. 2. Patient will identify signs/triggers that life has become out of balance:  3. Patient will identify two ways to set boundaries in order to achieve balance in their lives:  4. Patient will demonstrate ability to communicate their needs through discussion and/or role plays  Summary of Patient Progress: x     Therapeutic Modalities:   Cognitive Behavioral Therapy Solution-Focused Therapy Assertiveness Training  Evalina Field, MSW, LCSW Clinical Social Work 12/25/2018 1:57 PM

## 2018-12-25 NOTE — ED Notes (Signed)
Patriciaann Clan, PA, patient meets inpatient criteria for geropysch. TTS to secure placement. RN informed of disposition.

## 2018-12-25 NOTE — ED Notes (Signed)
Pt placed cross necklace in purse. Pt placed two rings and a watch in a biohazard bag and put that bag inside purse also. All belongings locked in locker.

## 2018-12-25 NOTE — Progress Notes (Signed)
Patient accepted to [facility] per Alyse Low, S. TTS Counselor (See note 12/25/18 11:13 AM). CSW called and spoke to pt's RN, Marliss Czar, to notify of acceptance information.  Areatha Keas. Judi Cong, MSW, [place] Disposition Clinical Social Work [phone] (cell) [phone] (office)

## 2018-12-25 NOTE — BHH Suicide Risk Assessment (Signed)
[facility] Admission Suicide Risk Assessment   Nursing information obtained from:  Patient Demographic factors:  Age 68 or older Current Mental Status:  NA Loss Factors:  NA Historical Factors:  NA Risk Reduction Factors:  Living with another person, especially a relative, Positive therapeutic relationship  Total Time spent with patient: 1 hour Principal Problem: <principal problem not specified> Diagnosis:  Active Problems:   Bipolar affective disorder, current episode manic with psychotic symptoms ([place])  Subjective Data: Patient seen chart reviewed.  This is a 68 year old woman with schizoaffective or bipolar disorder who presents very agitated with pressured speech disorganized thinking flight of ideas and mania.  There is no report of suicidality.  No evidence of attempts at self-harm.  Patient completely denies any suicidal thought.  Continued Clinical Symptoms:  Alcohol Use Disorder Identification Test Final Score (AUDIT): 0 The "Alcohol Use Disorders Identification Test", Guidelines for Use in Primary Care, Second Edition.  World Pharmacologist [facility]). Score between 0-7:  no or low risk or alcohol related problems. Score between 8-15:  moderate risk of alcohol related problems. Score between 16-19:  high risk of alcohol related problems. Score 20 or above:  warrants further diagnostic evaluation for alcohol dependence and treatment.   CLINICAL FACTORS:   Bipolar Disorder:   Mixed State   Musculoskeletal: Strength & Muscle Tone: within normal limits Gait & Station: unsteady Patient leans: N/A  Psychiatric Specialty Exam: Physical Exam  Nursing note and vitals reviewed. Constitutional: She appears well-developed and well-nourished.  HENT:  Head: Normocephalic and atraumatic.  Eyes: Pupils are equal, round, and reactive to light. Conjunctivae are normal.  Neck: Normal range of motion.  Cardiovascular: Normal heart sounds.  Respiratory: Effort normal.  GI: Soft.   Musculoskeletal: Normal range of motion.  Neurological: She is alert.  Skin: Skin is warm and dry.  Psychiatric: Her mood appears anxious. Her speech is rapid and/or pressured. Thought content is delusional. Cognition and memory are impaired. She expresses impulsivity. She expresses no homicidal and no suicidal ideation.    Review of Systems  Constitutional: Negative.   HENT: Negative.   Eyes: Negative.   Respiratory: Negative.   Cardiovascular: Negative.   Gastrointestinal: Negative.   Musculoskeletal: Negative.   Skin: Negative.   Neurological: Negative.   Psychiatric/Behavioral: Negative for hallucinations, substance abuse and suicidal ideas. The patient is nervous/anxious and has insomnia.     Blood pressure 131/70, pulse 98, temperature 98.8 F (37.1 C), temperature source Oral, resp. rate 18, height 5\' 4"  (1.626 m), weight 57.2 kg, SpO2 98 %.Body mass index is 21.63 kg/m.  General Appearance: Casual  Eye Contact:  Good  Speech:  Pressured  Volume:  Increased  Mood:  Anxious  Affect:  Constricted  Thought Process:  Disorganized  Orientation:  Full (Time, Place, and Person)  Thought Content:  Illogical, Paranoid Ideation and Tangential  Suicidal Thoughts:  No  Homicidal Thoughts:  No  Memory:  Immediate;   Fair Recent;   Fair Remote;   Fair  Judgement:  Fair  Insight:  Fair  Psychomotor Activity:  Restlessness  Concentration:  Concentration: Poor  Recall:  [employer] of Knowledge:  Fair  Language:  Fair  Akathisia:  No  Handed:  Right  AIMS (if indicated):     Assets:  Desire for Improvement Housing Resilience Social Support  ADL's:  Impaired  Cognition:  Impaired,  Mild  Sleep:         COGNITIVE FEATURES THAT CONTRIBUTE TO RISK:  Loss of executive function  SUICIDE RISK:   Minimal: No identifiable suicidal ideation.  Patients presenting with no risk factors but with morbid ruminations; may be classified as minimal risk based on the severity of the  depressive symptoms  PLAN OF CARE: Patient with bipolar disorder currently presents with manic presentation.  P she has no past history of suicide attempts and is completely denying suicidality.  Suicide risk is not currently the issue that brought her to the hospital.  Patient will nevertheless be on 15-minute checks and will be engaged in appropriate treatment with medication therapy and evaluation on the unit and safety will be reassessed before she is discharged  I certify that inpatient services furnished can reasonably be expected to improve the patient's condition.   Alethia Berthold, MD 12/25/2018, 5:04 PM

## 2018-12-26 DIAGNOSIS — F312 Bipolar disorder, current episode manic severe with psychotic features: Secondary | ICD-10-CM | POA: Diagnosis not present

## 2018-12-26 MED ORDER — OLANZAPINE 5 MG PO TABS
5.0000 mg | ORAL_TABLET | Freq: Every day | ORAL | Status: DC
  Administered 2018-12-27 – 2019-01-09 (×14): 5 mg via ORAL
  Filled 2018-12-26 (×15): qty 1

## 2018-12-26 MED ORDER — CHLORHEXIDINE GLUCONATE CLOTH 2 % EX PADS
6.0000 | MEDICATED_PAD | Freq: Every day | CUTANEOUS | Status: DC
  Administered 2019-01-05 – 2019-01-06 (×2): 6 via TOPICAL

## 2018-12-26 MED ORDER — LIDOCAINE-PRILOCAINE 2.5-2.5 % EX CREA
TOPICAL_CREAM | CUTANEOUS | Status: DC
  Administered 2018-12-26 – 2019-01-09 (×4): via TOPICAL
  Filled 2018-12-26: qty 5

## 2018-12-26 NOTE — Consult Note (Signed)
Date: 12/26/2018                  Patient Name:  Madison [name]  MRN: [id number]  DOB: 1951/03/17  Age / Sex: 68 y.o., female         PCP: Lucianne Lei, MD                 Service Requesting Consult: Psyciatry/ Clapacs, Madie Reno, MD                 Reason for Consult: ESRD            History of Present Illness: Patient is a 68 y.o. female with medical problems of ESRD, who was admitted to [facility] on 12/25/2018 for evaluation of Maniac symproms.  Patient is somewhat tangential with pressured speech and is not able to provide a lot of details about her medical history.  She denies any cardiac problems.  She thinks she has been on dialysis for about a year or so.  She knows she goes to dialysis center in [place] on Monday Wednesday and Friday.  She has a AV fistula in her arm.  She was brought to the emergency room by her sister because of several days of worsening agitation and confusion.  Denies any shortness of breath or leg edema.  Focused on pain control during dialysis cannulation.   Medications: Outpatient medications: Medications Prior to Admission  Medication Sig Dispense Refill Last Dose  . ferric citrate (AURYXIA) 1 GM 210 MG(Fe) tablet Take 210 mg by mouth 3 (three) times daily with meals.    Taking  . gabapentin (NEURONTIN) 300 MG capsule Take 300 mg by mouth at bedtime.    Taking  . metroNIDAZOLE (FLAGYL) 500 MG tablet Take 1 tablet (500 mg total) by mouth 2 (two) times daily. 14 tablet 1   . mirtazapine (REMERON) 30 MG tablet Take 30 mg by mouth at bedtime.    Taking  . Multiple Vitamin (MULTIVITAMIN) tablet Take 1 tablet by mouth daily.   Taking  . OLANZapine (ZYPREXA) 15 MG tablet Take 15 mg by mouth at bedtime.    Taking  . pantoprazole (PROTONIX) 40 MG tablet TAKE 1 TABLET BY MOUTH TWICE DAILY 30 MINUTES BEFORE MEALS FOR 3 MONTHS THEN DECREASE TO 1 TABLET DAILY. 56 tablet 3 Taking  . thiamine (VITAMIN B-1) 100 MG tablet Take 50 mg by mouth daily.    Taking    Current  medications: Current Facility-Administered Medications  Medication Dose Route Frequency Provider Last Rate Last Dose  . acetaminophen (TYLENOL) tablet 650 mg  650 mg Oral Q6H PRN Clapacs, John T, MD      . alum & mag hydroxide-simeth (MAALOX/MYLANTA) 200-200-20 MG/5ML suspension 30 mL  30 mL Oral Q4H PRN Clapacs, John T, MD      . Chlorhexidine Gluconate Cloth 2 % PADS 6 each  6 each Topical Q0600 Murlean Iba, MD      . gabapentin (NEURONTIN) capsule 300 mg  300 mg Oral QHS Clapacs, John T, MD   300 mg at 12/25/18 2239  . lidocaine-prilocaine (EMLA) cream   Topical Q M,W,F Benney [place], MD      . magnesium hydroxide (MILK OF MAGNESIA) suspension 30 mL  30 mL Oral Daily PRN Clapacs, John T, MD      . mirtazapine (REMERON) tablet 30 mg  30 mg Oral QHS Clapacs, John T, MD   30 mg at 12/25/18 2240  . multivitamin with minerals tablet 1  tablet  1 tablet Oral Daily Clapacs, Madie Reno, MD   1 tablet at 12/26/18 0746  . OLANZapine (ZYPREXA) tablet 20 mg  20 mg Oral QHS Clapacs, Madie Reno, MD   20 mg at 12/25/18 2239  . pantoprazole (PROTONIX) EC tablet 40 mg  40 mg Oral Daily Clapacs, Madie Reno, MD   40 mg at 12/26/18 0746  . thiamine (VITAMIN B-1) tablet 50 mg  50 mg Oral Daily Clapacs, Madie Reno, MD   50 mg at 12/26/18 5631      Allergies: No Known Allergies    Past Medical History: Past Medical History:  Diagnosis Date  . Anxiety   . ESRD (end stage renal disease) on dialysis ([place])   . Neuropathy   . Schizophrenia [facility])      Past Surgical History: Past Surgical History:  Procedure Laterality Date  . CHOLECYSTECTOMY    . COLONOSCOPY N/A 01/20/2016   Dr. Oneida Alar: 12 mm tubular adenoma removed from the transverse colon, 4 hyperplastic polyps removed from the rectum and sigmoid colon.  Next colonoscopy planned for April 2020.  Marland Kitchen ESOPHAGOGASTRODUODENOSCOPY N/A 12/19/2017   Procedure: ESOPHAGOGASTRODUODENOSCOPY (EGD);  Surgeon: [name], MD;  Location: [facility];  Service: Endoscopy;   Laterality: N/A;  8:30am  . fistula left arm    . GIVENS CAPSULE STUDY N/A 01/14/2018   Procedure: GIVENS CAPSULE STUDY;  Surgeon: [name], MD;  Location: [facility] ENDO SU[facility];  Service: Endoscopy;  Laterality: N/A;  7:30am  . HEMICOLECTOMY Right   . TUBAL LIGATION       Family History: Family History  Problem Relation Age of Onset  . Alzheimer's disease Mother   . Cancer Mother        pancreatic  . Prostate cancer Father   . Kidney failure Sister   . Breast cancer Neg Hx   . Colon cancer Neg Hx      Social History: Social History   Socioeconomic History  . Marital status: Single    Spouse name: Not on file  . Number of children: Not on file  . Years of education: Not on file  . Highest education level: Not on file  Occupational History  . Not on file  Social Needs  . Financial resource strain: Not on file  . Food insecurity:    Worry: Not on file    Inability: Not on file  . Transportation needs:    Medical: Not on file    Non-medical: Not on file  Tobacco Use  . Smoking status: Never Smoker  . Smokeless tobacco: Never Used  Substance and Sexual Activity  . Alcohol use: No  . Drug use: No  . Sexual activity: Yes  Lifestyle  . Physical activity:    Days per week: Not on file    Minutes per session: Not on file  . Stress: Not on file  Relationships  . Social connections:    Talks on phone: Not on file    Gets together: Not on file    Attends religious service: Not on file    Active member of club or organization: Not on file    Attends meetings of clubs or organizations: Not on file    Relationship status: Not on file  . Intimate partner violence:    Fear of current or ex partner: Not on file    Emotionally abused: Not on file    Physically abused: Not on file    Forced sexual activity: Not on file  Other Topics Concern  . Not on file  Social History Narrative  . Not on file     Review of Systems: Not reliable Gen:  HEENT:  CV:  Resp:   GI: GU :  MS:  Derm:   Psych: Heme:  Neuro:  Endocrine  Vital Signs: Blood pressure (!) 141/63, pulse (!) 102, temperature 98.6 F (37 C), temperature source Oral, resp. rate 17, height 5\' 4"  (1.626 m), weight 61.5 kg, SpO2 98 %.  No intake or output data in the 24 hours ending 12/26/18 1304  Weight trends: Filed Weights   12/25/18 1504 12/26/18 1045  Weight: 57.2 kg 61.5 kg    Physical Exam: General:  Sitting in the chair, no acute distress  HEENT  moist oral mucous membranes  Neck:  Supple  Lungs:  Normal breathing effort, clear to auscultation  Heart::  Regular, soft systolic murmur  Abdomen:  Soft, nontender  Extremities:  No peripheral edema  Neurologic:  Alert, able to answer questions, pressured speech, tangential thinking  Skin:  No acute rashes  Access:  AV fistula    Lab results: Basic Metabolic Panel: Recent Labs  Lab 01/20/2019 2226  NA 136  K 4.0  CL 92*  CO2 30  GLUCOSE 89  BUN 15  CREATININE 4.39*  CALCIUM 9.5    Liver Function Tests: Recent Labs  Lab 01/20/2019 2226  AST 33  ALT 18  ALKPHOS 78  BILITOT 0.7  PROT 8.3*  ALBUMIN 3.9   No results for input(s): LIPASE, AMYLASE in the last 168 hours. Recent Labs  Lab 01/19/2019 2334  AMMONIA 39*    CBC: Recent Labs  Lab 01/21/2019 2226  WBC 6.2  NEUTROABS 4.0  HGB 10.9*  HCT 34.5*  MCV 94.0  PLT 218    Cardiac Enzymes: Recent Labs  Lab 12/26/2018 2226  TROPONINI <0.03    BNP: Invalid input(s): POCBNP  CBG: No results for input(s): GLUCAP in the last 168 hours.  Microbiology: No results found for this or any previous visit (from the past 720 hour(s)).   Coagulation Studies: Recent Labs    12/27/2018 2226  LABPROT 12.2  INR 0.9    Urinalysis: No results for input(s): COLORURINE, LABSPEC, PHURINE, GLUCOSEU, HGBUR, BILIRUBINUR, KETONESUR, PROTEINUR, UROBILINOGEN, NITRITE, LEUKOCYTESUR in the last 72 hours.  Invalid input(s): APPERANCEUR      Imaging: Dg Chest  2 View  Result Date: 01/13/2019 CLINICAL DATA:  Initial evaluation for acute altered mental status. EXAM: CHEST - 2 VIEW COMPARISON:  Prior radiograph from 04/15/2016. FINDINGS: Cardiac and mediastinal silhouettes are within normal limits. Aortic atherosclerosis noted. Lungs normally inflated. No focal infiltrates. No pulmonary edema or pleural effusion. No pneumothorax. Nodular density overlying the left lung base most consistent with a nipple shadow. No acute osseous abnormality. Thoracolumbar scoliosis noted. Vascular stent overlies the upper left arm. IMPRESSION: 1. No active cardiopulmonary disease. 2. Aortic atherosclerosis. Electronically Signed   By: Jeannine Boga M.D.   On: 12/29/2018 22:54   Ct Head Wo Contrast  Result Date: 01/16/2019 CLINICAL DATA:  Altered mental status.  History of meningioma. EXAM: CT HEAD WITHOUT CONTRAST TECHNIQUE: Contiguous axial images were obtained from the base of the skull through the vertex without intravenous contrast. COMPARISON:  CT HEAD August 16, 2017 FINDINGS: BRAIN: No intraparenchymal hemorrhage, mass effect nor midline shift. Moderate parenchymal brain volume loss. No hydrocephalus. Patchy supratentorial white matter hypodensities within normal range for patient's age, though non-specific are most compatible with chronic small vessel ischemic disease.  No acute large vascular territory infarcts. No abnormal extra-axial fluid collections. Basal cisterns are patent. Stable 2 cm RIGHT parafalcine convexity partially calcified meningioma. Anterior falx calcifications. VASCULAR: Minimal calcific atherosclerosis of the carotid siphons. SKULL: No skull fracture. No significant scalp soft tissue swelling. SINUSES/ORBITS: Trace paranasal sinus mucosal thickening. Mastoid air cells are well aerated.The included ocular globes and orbital contents are non-suspicious. OTHER: None. IMPRESSION: 1. No acute intracranial process. 2. Moderate parenchymal brain volume loss,  advanced for age. 3. Stable 2 cm RIGHT parafalcine meningioma without mass effect. Electronically Signed   By: Elon Alas M.D.   On: 01/19/2019 23:07      Assessment & Plan: Pt is a 68 y.o. African-American  female with end-stage renal disease, bipolar disorder, hypertension was admitted on 12/25/2018 with manic symptoms.   1.  End-stage renal disease We will arrange for patient's hemodialysis during this hospital stay.  She will be on MWF schedule Outpatient records will be requested  2.  Anemia of chronic kidney disease Hemoglobin of 10.9.  We will continue low-dose Epogen with hemodialysis  3.  Secondary hyperparathyroidism. Continue home dose of Auryxia    LOS: [place] 4/3/20201:04 PM  [place], [place]  Note: This note was prepared with Dragon dictation. Any transcription errors are unintentional

## 2018-12-26 NOTE — B[facility] Suicide Risk Assessment (Signed)
[place] INPATIENT:  Family/Significant Other Suicide Prevention Education  Suicide Prevention Education:  Contact Attempts: Baird Kay, cousin [phone] has been identified by the patient as the family member/significant other with whom the patient will be residing, and identified as the person(s) who will aid the patient in the event of a mental health crisis.  With written consent from the patient, two attempts were made to provide suicide prevention education, prior to and/or following the patient's discharge.  We were unsuccessful in providing suicide prevention education.  A suicide education pamphlet was given to the patient to share with family/significant other.  Date and time of first attempt: 12/26/2018 at 10:54AM Date and time of second attempt:  CSW left a HIPAA compliant voicemail requesting a call back.  Monte Sereno MSW LCSW 12/26/2018, 10:54 AM

## 2018-12-26 NOTE — Plan of Care (Addendum)
D- Patient alert and oriented x2, however, she is disoriented to time and place. Patient presents in a pleasant, but preoccupied mood on assessment , reporting that she slept "good" last night and the only concern that she is voicing is that she is supposed to be going to dialysis today in "[place]", where she's from. Patient reported on her self-inventory that she is not experiencing any signs/symptoms of depression/anxiety. Patient also denies SI, HI, AVH, and pain at this time. Patient's goal for today is "taking medicine".  A- Scheduled medications administered to patient, per MD orders. Support and encouragement provided.  Routine safety checks conducted every 15 minutes.  Patient informed to notify staff with problems or concerns.  R- No adverse drug reactions noted. Patient contracts for safety at this time. Patient compliant with medications and treatment plan. Patient receptive, calm, and cooperative. Patient interacts well with others on the unit.  Patient remains safe at this time.  Problem: Education: Goal: Knowledge of [facility] Education information/materials will improve Outcome: Progressing   Problem: Coping: Goal: Ability to verbalize frustrations and anger appropriately will improve Outcome: Progressing Goal: Ability to demonstrate self-control will improve Outcome: Progressing   Problem: Safety: Goal: Periods of time without injury will increase Outcome: Progressing   Problem: Health Behavior/Discharge Planning: Goal: Compliance with prescribed medication regimen will improve Outcome: Progressing   Problem: Self-Care: Goal: Ability to participate in self-care as condition permits will improve Outcome: Progressing   Problem: Education: Goal: Ability to state activities that reduce stress will improve Outcome: Progressing   Problem: Coping: Goal: Ability to identify and develop effective coping behavior will improve Outcome:  Progressing   Problem: Self-Concept: Goal: Ability to identify factors that promote anxiety will improve Outcome: Progressing Goal: Level of anxiety will decrease Outcome: Progressing Goal: Ability to modify response to factors that promote anxiety will improve Outcome: Progressing

## 2018-12-26 NOTE — Tx Team (Addendum)
Interdisciplinary Treatment and Diagnostic Plan Update  12/26/2018 Time of Session: 40PM Madison [name] MRN: [id number]  Principal Diagnosis: Bipolar affective disorder, current episode manic with psychotic symptoms ([place])  Secondary Diagnoses: Principal Problem:   Bipolar affective disorder, current episode manic with psychotic symptoms ([place]) Active Problems:   End stage renal disease ([place])   Current Medications:  Current Facility-Administered Medications  Medication Dose Route Frequency Provider Last Rate Last Dose  . acetaminophen (TYLENOL) tablet 650 mg  650 mg Oral Q6H PRN Clapacs, John T, MD      . alum & mag hydroxide-simeth (MAALOX/MYLANTA) 200-200-20 MG/5ML suspension 30 mL  30 mL Oral Q4H PRN Clapacs, John T, MD      . Chlorhexidine Gluconate Cloth 2 % PADS 6 each  6 each Topical Q0600 Murlean Iba, MD      . gabapentin (NEURONTIN) capsule 300 mg  300 mg Oral QHS Clapacs, John T, MD   300 mg at 12/25/18 2239  . lidocaine-prilocaine (EMLA) cream   Topical Q M,W,F Singh, Harmeet, MD      . magnesium hydroxide (MILK OF MAGNESIA) suspension 30 mL  30 mL Oral Daily PRN Clapacs, John T, MD      . mirtazapine (REMERON) tablet 30 mg  30 mg Oral QHS Clapacs, John T, MD   30 mg at 12/25/18 2240  . multivitamin with minerals tablet 1 tablet  1 tablet Oral Daily Clapacs, Madie Reno, MD   1 tablet at 12/26/18 0746  . OLANZapine (ZYPREXA) tablet 20 mg  20 mg Oral QHS Clapacs, Madie Reno, MD   20 mg at 12/25/18 2239  . pantoprazole (PROTONIX) EC tablet 40 mg  40 mg Oral Daily Clapacs, Madie Reno, MD   40 mg at 12/26/18 0746  . thiamine (VITAMIN B-1) tablet 50 mg  50 mg Oral Daily Clapacs, Madie Reno, MD   50 mg at 12/26/18 0746   PTA Medications: Medications Prior to Admission  Medication Sig Dispense Refill Last Dose  . ferric citrate (AURYXIA) 1 GM 210 MG(Fe) tablet Take 210 mg by mouth 3 (three) times daily with meals.    Taking  . gabapentin (NEURONTIN) 300 MG capsule Take 300 mg by mouth at  bedtime.    Taking  . metroNIDAZOLE (FLAGYL) 500 MG tablet Take 1 tablet (500 mg total) by mouth 2 (two) times daily. 14 tablet 1   . mirtazapine (REMERON) 30 MG tablet Take 30 mg by mouth at bedtime.    Taking  . Multiple Vitamin (MULTIVITAMIN) tablet Take 1 tablet by mouth daily.   Taking  . OLANZapine (ZYPREXA) 15 MG tablet Take 15 mg by mouth at bedtime.    Taking  . pantoprazole (PROTONIX) 40 MG tablet TAKE 1 TABLET BY MOUTH TWICE DAILY 30 MINUTES BEFORE MEALS FOR 3 MONTHS THEN DECREASE TO 1 TABLET DAILY. 56 tablet 3 Taking  . thiamine (VITAMIN B-1) 100 MG tablet Take 50 mg by mouth daily.    Taking    Patient Stressors: Health problems Medication change or noncompliance  Patient Strengths: Active sense of humor Supportive family/friends  Treatment Modalities: Medication Management, Group therapy, Case management,  1 to 1 session with clinician, Psychoeducation, Recreational therapy.   Physician Treatment Plan for Primary Diagnosis: Bipolar affective disorder, current episode manic with psychotic symptoms ([place]) Long Term Goal(s): Improvement in symptoms so as ready for discharge Improvement in symptoms so as ready for discharge   Short Term Goals: Ability to verbalize feelings will improve Ability to demonstrate self-control will improve  Ability to identify and develop effective coping behaviors will improve Ability to maintain clinical measurements within normal limits will improve Compliance with prescribed medications will improve  Medication Management: Evaluate patient's response, side effects, and tolerance of medication regimen.  Therapeutic Interventions: 1 to 1 sessions, Unit Group sessions and Medication administration.  Evaluation of Outcomes: Progressing  Physician Treatment Plan for Secondary Diagnosis: Principal Problem:   Bipolar affective disorder, current episode manic with psychotic symptoms ([place]) Active Problems:   End stage renal disease ([place])  Long  Term Goal(s): Improvement in symptoms so as ready for discharge Improvement in symptoms so as ready for discharge   Short Term Goals: Ability to verbalize feelings will improve Ability to demonstrate self-control will improve Ability to identify and develop effective coping behaviors will improve Ability to maintain clinical measurements within normal limits will improve Compliance with prescribed medications will improve     Medication Management: Evaluate patient's response, side effects, and tolerance of medication regimen.  Therapeutic Interventions: 1 to 1 sessions, Unit Group sessions and Medication administration.  Evaluation of Outcomes: Progressing   RN Treatment Plan for Primary Diagnosis: Bipolar affective disorder, current episode manic with psychotic symptoms ([place]) Long Term Goal(s): Knowledge of disease and therapeutic regimen to maintain health will improve  Short Term Goals: Ability to verbalize frustration and anger appropriately will improve and Ability to participate in decision making will improve  Medication Management: RN will administer medications as ordered by provider, will assess and evaluate patient's response and provide education to patient for prescribed medication. RN will report any adverse and/or side effects to prescribing provider.  Therapeutic Interventions: 1 on 1 counseling sessions, Psychoeducation, Medication administration, Evaluate responses to treatment, Monitor vital signs and CBGs as ordered, Perform/monitor CIWA, COWS, AIMS and Fall Risk screenings as ordered, Perform wound care treatments as ordered.  Evaluation of Outcomes: Progressing   LCSW Treatment Plan for Primary Diagnosis: Bipolar affective disorder, current episode manic with psychotic symptoms ([place]) Long Term Goal(s): Safe transition to appropriate next level of care at discharge, Engage patient in therapeutic group addressing interpersonal concerns.  Short Term Goals: Engage  patient in aftercare planning with referrals and resources, Facilitate acceptance of mental health diagnosis and concerns, Identify triggers associated with mental health/substance abuse issues and Increase skills for wellness and recovery  Therapeutic Interventions: Assess for all discharge needs, 1 to 1 time with Social worker, Explore available resources and support systems, Assess for adequacy in community support network, Educate family and significant other(s) on suicide prevention, Complete Psychosocial Assessment, Interpersonal group therapy.  Evaluation of Outcomes: Progressing   Progress in Treatment: Attending groups: No. Participating in groups: No. Taking medication as prescribed: Yes. Toleration medication: Yes. Family/Significant other contact made: Yes, individual(s) contacted:  Pts cousin Patient understands diagnosis: Yes. Discussing patient identified problems/goals with staff: Yes. Medical problems stabilized or resolved: Yes. Denies suicidal/homicidal ideation: Yes. Issues/concerns per patient self-inventory: No. Other: N/A  New problem(s) identified: No, Describe:  none  New Short Term/Long Term Goal(s): Dmedication management for mood stabilization;  development of comprehensive mental wellness/sobriety plan.   Patient Goals:  "going home"  Discharge Plan or Barriers: SPE pamphlet, Mobile Crisis information, and AA/NA information provided to patient for additional community support and resources. Pt has an appointment with her psychiatrist Dr. Darleene Cleaver on 01/06/2019 at 9:00 AM.  Reason for Continuation of Hospitalization: Medication stabilization  Estimated Length of Stay: 5-7 days  Recreational Therapy: Patient Stressors: N/A Patient Goal: Patient will focus on task/topic with 2 prompts from  staff within 5 recreation therapy group sessions  Attendees: Patient: Madison [name] 12/26/2018 3:05 PM  Physician: Dr Weber Cooks, MD 12/26/2018 3:05 PM  Nursing: Lyda Kalata RN 12/26/2018 3:05 PM  RN Care Manager: 12/26/2018 3:05 PM  Social Worker: Minette Brine Moton LCSW 12/26/2018 3:05 PM  Recreational Therapist: Roanna Epley CTRS LRT 12/26/2018 3:05 PM  Other: Sanjuana Kava LCSW 12/26/2018 3:05 PM  Other:  12/26/2018 3:05 PM  Other: 12/26/2018 3:05 PM    Scribe for Treatment Team: Mariann Laster Moton, LCSW 12/26/2018 3:05 PM

## 2018-12-26 NOTE — [facility] Group Notes (Signed)

## 2018-12-26 NOTE — Progress Notes (Signed)
Hemodialysis treatment completed at 1844. Net UF removed 1L. Blood rinsed back, Needles removed one at a time and manual pressure held to AVF until hemostasis achieved. No active bleeding upon patient departure from [place]. Post hemodialysis report given to A. Ariwodo, RN   12/26/18 1844  Vital Signs  Pulse Rate 84  BP 136/84  BP Location Right Arm  BP Method Automatic  Patient Position (if appropriate) Sitting  During Hemodialysis Assessment  Blood Flow Rate (mL/min) 400 mL/min  Arterial Pressure (mmHg) -200 mmHg  Venous Pressure (mmHg) 120 mmHg  Transmembrane Pressure (mmHg) 40 mmHg  Dialysis Fluid Bolus Normal Saline  Bolus Amount (mL) 250 mL (rinseback)  Intra-Hemodialysis Comments Tx completed  Post-Hemodialysis Assessment  Rinseback Volume (mL) 250 mL  Dialyzer Clearance Lightly streaked  Duration of HD Treatment -hour(s) 3 hour(s)  Hemodialysis Intake (mL) 500 mL  UF Total -Machine (mL) 1500 mL  Net UF (mL) 1000 mL  Tolerated HD Treatment Yes  AVG/AVF Arterial Site Held (minutes) 15 minutes  AVG/AVF Venous Site Held (minutes) 15 minutes  Education / Care Plan  Dialysis Education Provided Yes  Documented Education in Care Plan Yes  Outpatient Plan of Care Reviewed and on Chart Yes    RN.

## 2018-12-26 NOTE — [facility] Suicide Risk Assessment (Signed)
[place] INPATIENT:  Family/Significant Other Suicide Prevention Education  Suicide Prevention Education:  Education Completed; Baird Kay, cousin [phone] has been identified by the patient as the family member/significant other with whom the patient will be residing, and identified as the person(s) who will aid the patient in the event of a mental health crisis (suicidal ideations/suicide attempt).  With written consent from the patient, the family member/significant other has been provided the following suicide prevention education, prior to the and/or following the discharge of the patient.  The suicide prevention education provided includes the following:  Suicide risk factors  Suicide prevention and interventions  National Suicide Hotline telephone number  [facility] assessment telephone number  [facility] Emergency Assistance [place] and/or Residential Mobile Crisis Unit telephone number  Request made of family/significant other to:  Remove weapons (e.g., guns, rifles, knives), all items previously/currently identified as safety concern.    Remove drugs/medications (over-the-counter, prescriptions, illicit drugs), all items previously/currently identified as a safety concern.  The family member/significant other verbalizes understanding of the suicide prevention education information provided.  The family member/significant other agrees to remove the items of safety concern listed above.  Mrs Darliss Ridgel reported that pt ended up in the hospital due to her mothers death about two weeks ago and pt living in the past. Darliss Ridgel stated that she is retired and takes pt to all her doctor appointments and has noticed that pt seems to live in the past and cant decipher fact from fiction. Since the death of her mother, pt has been talking nonstop and seems to keep rambling. Gaynell denies concerns of SI, guns or weapons in the home, or pt returning to live with  her sister [name]. Mrs. Darliss Ridgel stated she will not be the one to pick pt up at discharge so it will most likely be one of her sisters to pick her up.   [place] MSW LCSW 12/26/2018, 10:58 AM

## 2018-12-26 NOTE — Progress Notes (Signed)
Patient ID: Madison [name], female   DOB: May 21, 1951, 68 y.o.   MRN: [id number] Brief update: I spoke this morning to Dr. Candiss Norse from nephrology.  They will get the patient scheduled for her dialysis and have her dialysis needs taken care of while she is with [place].  I spoke with Ms.Madison [name], who is the patient's cousin.  Ms. Cherlyn [name] helps the patient with transportation and is very close with her but is not the person with whom she lives.  She did give me the phone #[phone] for Madison [name], who is the patient's sister with whom she lives.  I will try and get in touch with the sister today.  Got some useful collateral from the cousin.

## 2018-12-26 NOTE — Progress Notes (Signed)
[facility] MD Progress Note  12/26/2018 4:42 PM Madison [name]  MRN:  [id number] Subjective: Follow-up note for this patient 68 year old woman with a history of bipolar disorder.  Patient remains hyperverbal with somewhat impaired insight.  She met with me independently and met with the treatment team today.  She has a hard time directly addressing a question because she immediately becomes tangential with flight of ideas.  She is taking care of her ADLs well however and is cooperative with medical treatment including dialysis.  I spoke today with her cousin as mentioned in the previous note.  Confirmed that her current mental state is not typical for her but is a breakdown into manic symptoms probably related to the recent death of her mother Principal Problem: Bipolar affective disorder, current episode manic with psychotic symptoms ([place]) Diagnosis: Principal Problem:   Bipolar affective disorder, current episode manic with psychotic symptoms ([place]) Active Problems:   End stage renal disease ([place])  Total Time spent with patient: 30 minutes  Past Psychiatric History: Patient has a very long history of bipolar disorder multiple previous hospitalizations.  Previous long periods of stability primarily as far as I can tell with olanzapine  Past Medical History:  Past Medical History:  Diagnosis Date  . Anxiety   . ESRD (end stage renal disease) on dialysis ([place])   . Neuropathy   . Schizophrenia [facility])     Past Surgical History:  Procedure Laterality Date  . CHOLECYSTECTOMY    . COLONOSCOPY N/A 01/20/2016   Dr. Oneida Alar: 12 mm tubular adenoma removed from the transverse colon, 4 hyperplastic polyps removed from the rectum and sigmoid colon.  Next colonoscopy planned for April 2020.  Marland Kitchen ESOPHAGOGASTRODUODENOSCOPY N/A 12/19/2017   Procedure: ESOPHAGOGASTRODUODENOSCOPY (EGD);  Surgeon: [name], MD;  Location: [facility] ENDO SUITE;  Service: Endoscopy;  Laterality: N/A;  8:30am  . fistula left arm    .  GIVENS CAPSULE STUDY N/A 01/14/2018   Procedure: GIVENS CAPSULE STUDY;  Surgeon: [name], MD;  Location: [facility] ENDO SUITE;  Service: Endoscopy;  Laterality: N/A;  7:30am  . HEMICOLECTOMY Right   . TUBAL LIGATION     Family History:  Family History  Problem Relation Age of Onset  . Alzheimer's disease Mother   . Cancer Mother        pancreatic  . Prostate cancer Father   . Kidney failure Sister   . Breast cancer Neg Hx   . Colon cancer Neg Hx    Family Psychiatric  History: Had a twin sister with similar illness from what I am told. Social History:  Social History   Substance and Sexual Activity  Alcohol Use No     Social History   Substance and Sexual Activity  Drug Use No    Social History   Socioeconomic History  . Marital status: Single    Spouse name: Not on file  . Number of children: Not on file  . Years of education: Not on file  . Highest education level: Not on file  Occupational History  . Not on file  Social Needs  . Financial resource strain: Not on file  . Food insecurity:    Worry: Not on file    Inability: Not on file  . Transportation needs:    Medical: Not on file    Non-medical: Not on file  Tobacco Use  . Smoking status: Never Smoker  . Smokeless tobacco: Never Used  Substance and Sexual Activity  . Alcohol use: No  .  Drug use: No  . Sexual activity: Yes  Lifestyle  . Physical activity:    Days per week: Not on file    Minutes per session: Not on file  . Stress: Not on file  Relationships  . Social connections:    Talks on phone: Not on file    Gets together: Not on file    Attends religious service: Not on file    Active member of club or organization: Not on file    Attends meetings of clubs or organizations: Not on file    Relationship status: Not on file  Other Topics Concern  . Not on file  Social History Narrative  . Not on file   Additional Social History:                         Sleep: Fair  Appetite:   Fair  Current Medications: Current Facility-Administered Medications  Medication Dose Route Frequency Provider Last Rate Last Dose  . acetaminophen (TYLENOL) tablet 650 mg  650 mg Oral Q6H PRN Sukhraj Esquivias T, MD      . alum & mag hydroxide-simeth (MAALOX/MYLANTA) 200-200-20 MG/5ML suspension 30 mL  30 mL Oral Q4H PRN Odell Fasching T, MD      . Chlorhexidine Gluconate Cloth 2 % PADS 6 each  6 each Topical Q0600 Murlean Iba, MD      . gabapentin (NEURONTIN) capsule 300 mg  300 mg Oral QHS Beryl Hornberger T, MD   300 mg at 12/25/18 2239  . lidocaine-prilocaine (EMLA) cream   Topical Q M,W,F Singh, Harmeet, MD      . magnesium hydroxide (MILK OF MAGNESIA) suspension 30 mL  30 mL Oral Daily PRN Altus Zaino T, MD      . mirtazapine (REMERON) tablet 30 mg  30 mg Oral QHS Emrick Hensch T, MD   30 mg at 12/25/18 2240  . multivitamin with minerals tablet 1 tablet  1 tablet Oral Daily Lilla Callejo, Madie Reno, MD   1 tablet at 12/26/18 0746  . OLANZapine (ZYPREXA) tablet 20 mg  20 mg Oral QHS Montie Gelardi, Madie Reno, MD   20 mg at 12/25/18 2239  . OLANZapine (ZYPREXA) tablet 5 mg  5 mg Oral Daily Zurri Rudden T, MD      . pantoprazole (PROTONIX) EC tablet 40 mg  40 mg Oral Daily Michaline Kindig, Madie Reno, MD   40 mg at 12/26/18 0746  . thiamine (VITAMIN B-1) tablet 50 mg  50 mg Oral Daily Earlisha Sharples, Madie Reno, MD   50 mg at 12/26/18 8366    Lab Results:  Results for orders placed or performed during the hospital encounter of 01/22/2019 (from the past 48 hour(s))  Acetaminophen level     Status: Abnormal   Collection Time: 01/11/2019 10:26 PM  Result Value Ref Range   Acetaminophen (Tylenol), Serum <10 (L) 10 - 30 ug/mL    Comment: (NOTE) Therapeutic concentrations vary significantly. A range of 10-30 ug/mL  may be an effective concentration for many patients. However, some  are best treated at concentrations outside of this range. Acetaminophen concentrations >150 ug/mL at 4 hours after ingestion  and >50 ug/mL at 12 hours  after ingestion are often associated with  toxic reactions. Performed at [facility], [address], [place], [place] [address]   Comprehensive metabolic panel     Status: Abnormal   Collection Time: 01/04/2019 10:26 PM  Result Value Ref Range   Sodium 136 135 - 145  mmol/L   Potassium 4.0 3.5 - 5.1 mmol/L   Chloride 92 (L) 98 - 111 mmol/L   CO2 30 22 - 32 mmol/L   Glucose, Bld 89 70 - 99 mg/dL   BUN 15 8 - 23 mg/dL   Creatinine, Ser 4.39 (H) 0.44 - 1.00 mg/dL   Calcium 9.5 8.9 - 10.3 mg/dL   Total Protein 8.3 (H) 6.5 - 8.1 g/dL   Albumin 3.9 3.5 - 5.0 g/dL   AST 33 15 - 41 U/L   ALT 18 0 - 44 U/L   Alkaline Phosphatase 78 38 - 126 U/L   Total Bilirubin 0.7 0.3 - 1.2 mg/dL   GFR calc non Af Amer 10 (L) >60 mL/min   GFR calc Af Amer 11 (L) >60 mL/min   Anion gap 14 5 - 15    Comment: Performed at [facility], [address] [place], [place] [address]  Ethanol     Status: None   Collection Time: 01/04/2019 10:26 PM  Result Value Ref Range   Alcohol, Ethyl (B) <10 <10 mg/dL    Comment: (NOTE) Lowest detectable limit for serum alcohol is 10 mg/dL. For medical purposes only. Performed at [facility], [address], [place], [place] [address]   Salicylate level     Status: None   Collection Time: 01/10/2019 10:26 PM  Result Value Ref Range   Salicylate Lvl <8.0 2.8 - 30.0 mg/dL    Comment: Performed at [facility], [address], [place], [place] [address]  Troponin I - Once     Status: None   Collection Time: 01/08/2019 10:26 PM  Result Value Ref Range   Troponin I <0.03 <0.03 ng/mL    Comment: Performed at [facility], [address], [place], [place] [address]  CBC with Differential     Status: Abnormal   Collection Time: 01/18/2019 10:26 PM  Result Value Ref Range   WBC 6.2 4.0 - 10.5 K/uL   RBC 3.67 (L) 3.87 - 5.11 MIL/uL   Hemoglobin 10.9 (L) 12.0 - 15.0 g/dL   HCT 34.5 (L) 36.0 - 46.0 %   MCV 94.0 80.0 - 100.0 fL   MCH 29.7 26.0 - 34.0 pg   MCHC 31.6 30.0 - 36.0 g/dL    RDW 16.9 (H) 11.5 - 15.5 %   Platelets 218 150 - 400 K/uL   nRBC 0.0 0.0 - 0.2 %   Neutrophils Relative % 65 %   Neutro Abs 4.0 1.7 - 7.7 K/uL   Lymphocytes Relative 23 %   Lymphs Abs 1.4 0.7 - 4.0 K/uL   Monocytes Relative 10 %   Monocytes Absolute 0.6 0.1 - 1.0 K/uL   Eosinophils Relative 1 %   Eosinophils Absolute 0.0 0.0 - 0.5 K/uL   Basophils Relative 1 %   Basophils Absolute 0.0 0.0 - 0.1 K/uL   Immature Granulocytes 0 %   Abs Immature Granulocytes 0.01 0.00 - 0.07 K/uL    Comment: Performed at [facility], [address], [place], [place] [address]  Protime-INR     Status: None   Collection Time: 12/30/2018 10:26 PM  Result Value Ref Range   Prothrombin Time 12.2 11.4 - 15.2 seconds   INR 0.9 0.8 - 1.2    Comment: (NOTE) INR goal varies based on device and disease states. Performed at [facility], [address], [place], [place] [address]   TSH     Status: None   Collection Time: 01/16/2019 10:26 PM  Result Value Ref  Range   TSH 3.331 0.350 - 4.500 uIU/mL    Comment: Performed by a 3rd Generation assay with a functional sensitivity of <=0.01 uIU/mL. Performed at [facility], [address] [place], [place] [address]   Ammonia     Status: Abnormal   Collection Time: 01/18/2019 11:34 PM  Result Value Ref Range   Ammonia 39 (H) 9 - 35 umol/L    Comment: Performed at [facility], [address], [place], [place] [address]    Blood Alcohol level:  Lab Results  Component Value Date   ETH <10 01/20/2019   ETH <5 38/06/1750    Metabolic Disorder Labs: No results found for: HGBA1C, MPG No results found for: PROLACTIN No results found for: CHOL, TRIG, HDL, CHOLHDL, VLDL, LDLCALC  Physical Findings: AIMS:  , ,  ,  ,    CIWA:    COWS:     Musculoskeletal: Strength & Muscle Tone: within normal limits Gait & Station: normal Patient leans: N/A  Psychiatric Specialty Exam: Physical Exam  Nursing note and vitals reviewed. Constitutional: She appears well-developed  and well-nourished.  HENT:  Head: Normocephalic and atraumatic.  Eyes: Pupils are equal, round, and reactive to light. Conjunctivae are normal.  Neck: Normal range of motion.  Cardiovascular: Regular rhythm and normal heart sounds.  Respiratory: Effort normal. No respiratory distress.  GI: Soft.  Musculoskeletal: Normal range of motion.  Neurological: She is alert.  Skin: Skin is warm and dry.  Psychiatric: Her mood appears anxious. Her speech is rapid and/or pressured and tangential. She is agitated. She is not aggressive. Thought content is paranoid. Cognition and memory are impaired. She expresses impulsivity. She expresses no homicidal and no suicidal ideation.    Review of Systems  Constitutional: Negative.   HENT: Negative.   Eyes: Negative.   Respiratory: Negative.   Cardiovascular: Negative.   Gastrointestinal: Negative.   Musculoskeletal: Negative.   Skin: Negative.   Neurological: Negative.   Psychiatric/Behavioral: Negative for depression, hallucinations, memory loss, substance abuse and suicidal ideas. The patient is nervous/anxious. The patient does not have insomnia.     Blood pressure (!) 141/63, pulse (!) 102, temperature 98.6 F (37 C), temperature source Oral, resp. rate 17, height '5\' 4"'$  (1.626 m), weight 61.5 kg, SpO2 98 %.Body mass index is 23.26 kg/m.  General Appearance: Fairly Groomed  Eye Contact:  Good  Speech:  Pressured  Volume:  Increased  Mood:  Anxious  Affect:  Constricted  Thought Process:  Disorganized  Orientation:  Full (Time, Place, and Person)  Thought Content:  Paranoid Ideation, Rumination and Tangential  Suicidal Thoughts:  No  Homicidal Thoughts:  No  Memory:  Immediate;   Fair Recent;   Poor Remote;   Fair  Judgement:  Impaired  Insight:  Shallow  Psychomotor Activity:  Decreased  Concentration:  Concentration: Poor  Recall:  Poor  Fund of Knowledge:  Poor  Language:  Fair  Akathisia:  No  Handed:  Right  AIMS (if  indicated):     Assets:  Desire for Improvement Housing Resilience Social Support  ADL's:  Intact  Cognition:  Impaired,  Mild  Sleep:  Number of Hours: 6.3     Treatment Plan Summary: Daily contact with patient to assess and evaluate symptoms and progress in treatment, Medication management and Plan As noted in the previous note I took care of contacting nephrology did today to make sure that her dialysis continues.  I am increasing her olanzapine up to 20 mg at night  and 5 mg in the day for coverage of her mania.  I see that this is a dosage that had been used with effectiveness in the past.  Continue engagement in individual and group therapy.  Patient seems very stable on her feet and does not appear to be likely to develop a falls risk from increased medicine.  Alethia Berthold, MD 12/26/2018, 4:42 PM
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
## 2018-12-26 NOTE — Progress Notes (Signed)
Recreation Therapy Notes  INPATIENT RECREATION THERAPY ASSESSMENT  Patient Details Name: BETHANN QUALLEY MRN: [id number] DOB: [birth year] Today's Date: 12/26/2018       Information Obtained From: Patient  Able to Participate in Assessment/Interview: Yes  Patient Presentation: Responsive, Hyperverbal  Reason for Admission (Per Patient): Active Symptoms  Patient Stressors:    Coping Skills:   Education officer, community, Read  Leisure Interests (2+):  Individual - Reading  Frequency of Recreation/Participation: Monthly  Awareness of Community Resources:  Yes  Community Resources:  [employer]  Current Use:    If no, Barriers?:    Expressed Interest in [place] of Residence:  Hydrologist  Patient Main Form of Transportation:    Patient Strengths:  Being strong  Patient Identified Areas of Improvement:  N/A  Patient Goal for Hospitalization:  To go home  Current SI (including self-harm):  No  Current HI:  No  Current AVH: No  Staff Intervention Plan: Group Attendance, Collaborate with Interdisciplinary Treatment Team  Consent to Intern Participation: N/A  Cariann Kinnamon 12/26/2018, 3:10 PM

## 2018-12-26 NOTE — Progress Notes (Signed)
Patient's evening Zyprexa was not given because she is off the unit at Dialysis.
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
## 2018-12-26 NOTE — Progress Notes (Signed)
Fistula on the left hand dressed intact and assessed, no bleeding , bruit and thrill present  Noted.

## 2018-12-26 NOTE — Progress Notes (Signed)
Pre hemodialysis report received from D. Ravenell, RN. Received patient in acute room via Chair with 1:1 companion. . Awake, alert and verbally responsive. No actute distress noted. + Bruit/thrill noted to LUE AVF.  Accessed AVF with two 15g needles without difficulty. Hemodialysis treatment initiated at 1539 via AVF.  Plan for 3 hours hemodialysis with UF of 1-1.5 L as tolerated.    12/26/18 1531  Hand-Off documentation  Report received from (Full Name) Lyda Kalata, RN  Vital Signs  Temp 98.4 F (36.9 C)  Temp Source Oral  Resp 18  BP 133/70  BP Location Right Arm  BP Method Automatic  Patient Position (if appropriate) Sitting  Oxygen Therapy  SpO2 98 %  O2 Device Room Air  Pain Assessment  Pain Scale 0-10  Pain Score 0  Time-Out for Hemodialysis  What Procedure? hemodialysis  Pt Identifiers(min of two) First/Last Name;MRN/Account#  Correct Site? Yes  Correct Side? Yes  Correct Procedure? Yes  Consents Verified? Yes  Rad Studies Available? N/A  Safety Precautions Reviewed? Yes  Engineer, civil (consulting) Number 4  Station Number 4  UF/Alarm Test Passed  Conductivity: Meter 14  Conductivity: Machine  13.8  pH 7.4  Reverse Osmosis main  Normal Saline Lot Number [id number]  Dialyzer Lot Number [id number]  Disposable Set Lot Number [id number]  Machine Temperature 98.6 F (37 C)  Musician and Audible Yes  Blood Lines Intact and Secured Yes  Pre Treatment Patient Checks  Vascular access used during treatment Fistula  Patient is receiving dialysis in a chair Yes  Hemodialysis Consent Verified Yes  Length of  DialysisTreatment -hour(s) 3 Hour(s)  Dialyzer Elisio 17H NR  Dialysate 3K, 2.5 Ca  Dialysate Flow Ordered 600  Blood Flow Rate Ordered 400 mL/min  Ultrafiltration Goal 1500 Liters

## 2018-12-26 NOTE — Progress Notes (Signed)
Patient returned from dialysis treatment , alert, confused and disoriented, requiring constant redirection and orientation, bp 136/78, pulse 82. Temp 97.4 no acute distress.

## 2018-12-26 NOTE — Progress Notes (Signed)
Recreation Therapy Notes  Date: 12/26/2018  Time: 9:30 am   Location: Craft room   Behavioral response: N/A   Intervention Topic: Leisure  Discussion/Intervention: Patient did not attend group.   Clinical Observations/Feedback:  Patient did not attend group.   [name] LRT/CTRS        [name] 12/26/2018 11:10 AM

## 2018-12-26 NOTE — Progress Notes (Signed)
Patient was weighed this morning by this writer, at 135 lbs. This Probation officer also felt the thrill and listened to the bruit of her fistula in her left upper arm.

## 2018-12-27 NOTE — Progress Notes (Signed)
Patient is needing constant redirections due to disease process and confusion, took her medications and show no side effects , patient is preoccupied, has flight of teas with loose associations and re-venting her self, safety is maintained and redirected to safety no issues , monitored frequently.

## 2018-12-27 NOTE — Progress Notes (Signed)
[facility] MD Progress Note  12/27/2018 1:19 PM ESTHEFANY [name]  MRN:  [id number] Subjective:  68yo F, who presents to [facility] inpatient psych unit due to exacerbation of bipolar disorder, currently manic episode in settings of recent family loss.  Patient seen, chart reviewed. Patient appears hyperactive, she observed near the nursing station a lot, making multiple requests. She appears tangential with flight of ideas. She reports feeling "well" and "I am ready to go back to [place]". She reports that her mother passed away 2 weeks ago and it made her feel deeply depressed, she reportts that she feels "much better now. I am fine". She denies suicidal or homicidal ideations, does not express any delusions, denies any hallucinations. Reports medication-compliance and denies side effects from her psych medications.   Principal Problem: Bipolar affective disorder, current episode manic with psychotic symptoms (Madison [name]) Diagnosis: Principal Problem:   Bipolar affective disorder, current episode manic with psychotic symptoms (Madison [name]) Active Problems:   End stage renal disease ([place])  Total Time spent with patient: 15 minutes  Past Psychiatric History: see admission H&P  Past Medical History:  Past Medical History:  Diagnosis Date  . Anxiety   . ESRD (end stage renal disease) on dialysis ([place])   . Neuropathy   . Schizophrenia [facility])     Past Surgical History:  Procedure Laterality Date  . CHOLECYSTECTOMY    . COLONOSCOPY N/A 01/20/2016   Dr. Oneida Alar: 12 mm tubular adenoma removed from the transverse colon, 4 hyperplastic polyps removed from the rectum and sigmoid colon.  Next colonoscopy planned for April 2020.  Marland Kitchen ESOPHAGOGASTRODUODENOSCOPY N/A 12/19/2017   Procedure: ESOPHAGOGASTRODUODENOSCOPY (EGD);  Surgeon: [name], MD;  Location[facility];  Service: Endoscopy;  Laterality: N/A;  8:30am  . fistula left arm    . GIVENS CAPSULE STUDY N/A 01/14/2018   Procedure: GIVENS CAPSULE STUDY;  Surgeon:  [name], MD;  Location: [facility];  Service: Endoscopy;  Laterality: N/A;  7:30am  . HEMICOLECTOMY Right   . TUBAL LIGATION     Family History:  Family History  Problem Relation Age of Onset  . Alzheimer's disease Mother   . Cancer Mother        pancreatic  . Prostate cancer Father   . Kidney failure Sister   . Breast cancer Neg Hx   . Colon cancer Neg Hx    Family Psychiatric  History:see admission H&P Social History:  Social History   Substance and Sexual Activity  Alcohol Use No     Social History   Substance and Sexual Activity  Drug Use No    Social History   Socioeconomic History  . Marital status: Single    Spouse name: Not on file  . Number of children: Not on file  . Years of education: Not on file  . Highest education level: Not on file  Occupational History  . Not on file  Social Needs  . Financial resource strain: Not on file  . Food insecurity:    Worry: Not on file    Inability: Not on file  . Transportation needs:    Medical: Not on file    Non-medical: Not on file  Tobacco Use  . Smoking status: Never Smoker  . Smokeless tobacco: Never Used  Substance and Sexual Activity  . Alcohol use: No  . Drug use: No  . Sexual activity: Yes  Lifestyle  . Physical activity:    Days per week: Not on file  Minutes per session: Not on file  . Stress: Not on file  Relationships  . Social connections:    Talks on phone: Not on file    Gets together: Not on file    Attends religious service: Not on file    Active member of club or organization: Not on file    Attends meetings of clubs or organizations: Not on file    Relationship status: Not on file  Other Topics Concern  . Not on file  Social History Narrative  . Not on file   Additional Social History:                         Sleep: Good  Appetite:  Good  Current Medications: Current Facility-Administered Medications  Medication Dose Route Frequency Provider Last  Rate Last Dose  . acetaminophen (TYLENOL) tablet 650 mg  650 mg Oral Q6H PRN Clapacs, John T, MD      . alum & mag hydroxide-simeth (MAALOX/MYLANTA) 200-200-20 MG/5ML suspension 30 mL  30 mL Oral Q4H PRN Clapacs, John T, MD      . Chlorhexidine Gluconate Cloth 2 % PADS 6 each  6 each Topical Q0600 Murlean Iba, MD      . gabapentin (NEURONTIN) capsule 300 mg  300 mg Oral QHS Clapacs, John T, MD   300 mg at 12/26/18 2124  . lidocaine-prilocaine (EMLA) cream   Topical Q M,W,F Singh, Harmeet, MD      . magnesium hydroxide (MILK OF MAGNESIA) suspension 30 mL  30 mL Oral Daily PRN Clapacs, John T, MD      . mirtazapine (REMERON) tablet 30 mg  30 mg Oral QHS Clapacs, Madie Reno, MD   30 mg at 12/26/18 2124  . multivitamin with minerals tablet 1 tablet  1 tablet Oral Daily Clapacs, Madie Reno, MD   1 tablet at 12/27/18 9924  . OLANZapine (ZYPREXA) tablet 20 mg  20 mg Oral QHS Clapacs, Madie Reno, MD   20 mg at 12/26/18 2124  . OLANZapine (ZYPREXA) tablet 5 mg  5 mg Oral Daily Clapacs, Madie Reno, MD   5 mg at 12/27/18 2683  . pantoprazole (PROTONIX) EC tablet 40 mg  40 mg Oral Daily Clapacs, Madie Reno, MD   40 mg at 12/27/18 0723  . thiamine (VITAMIN B-1) tablet 50 mg  50 mg Oral Daily Clapacs, Madie Reno, MD   50 mg at 12/27/18 4196    Lab Results: No results found for this or any previous visit (from the past 48 hour(s)).  Blood Alcohol level:  Lab Results  Component Value Date   ETH <10 01/04/2019   ETH <5 22/29/7989    Metabolic Disorder Labs: No results found for: HGBA1C, MPG No results found for: PROLACTIN No results found for: CHOL, TRIG, HDL, CHOLHDL, VLDL, LDLCALC  Physical Findings: AIMS:  , ,  ,  ,    CIWA:    COWS:     Musculoskeletal: Strength & Muscle Tone: within normal limits Gait & Station: normal Patient leans: N/A  Psychiatric Specialty Exam: Physical Exam  ROS  Blood pressure 127/68, pulse 96, temperature 99 F (37.2 C), temperature source Oral, resp. rate 16, height 5\' 4"  (1.626  m), weight 61.5 kg, SpO2 100 %.Body mass index is 23.26 kg/m.  General Appearance: Casual  Eye Contact:  Good  Speech:  Pressured  Volume:  Normal  Mood:  Euthymic  Affect:  Appropriate  Thought Process:  Coherent  Orientation:  Full (Time, Place, and Person)  Thought Content:  Tangential  Suicidal Thoughts:  No  Homicidal Thoughts:  No  Memory:  Immediate;   Fair Recent;   Fair Remote;   Fair  Judgement:  Fair  Insight:  Fair  Psychomotor Activity:  Normal  Concentration:  Concentration: Fair and Attention Span: Fair  Recall:  [employer] of Knowledge:  Fair  Language:  Good  Akathisia:  No  Handed:  Right  AIMS (if indicated):     Assets:  Desire for Improvement Housing Social Support  ADL's:  Intact  Cognition:  Impaired,  Mild  Sleep:  Number of Hours: 2     Treatment Plan Summary: Daily contact with patient to assess and evaluate symptoms and progress in treatment   68yo F, who presents to [facility] inpatient psych unit due to exacerbation of bipolar disorder, currently manic episode in settings of recent family loss.  Patient denies any current mental health complaints, although she appears hyperactive in the unit, she is tangential with flight of ideas. She reports good sleep, however reports says she slept only 2 hours. Reports medication-compliance and denies side effects from her psych medications. Will continue current psych medications for now.  Impression: Bipolar disorder, currently manic.  Plan: -continue inpatient psych admission; 15-minute checks; daily contact with patient to assess and evaluate symptoms and progress in treatment; psychoeducation.   -continue scheduled psych medications: Zyprexa 20mg  PO QHS, Remeron 30mg  PO QHS.  -continue PRN medications.   -Disposition: to be determined.    Larita Fife, MD 12/27/2018, 1:19 PM

## 2018-12-27 NOTE — B[facility] Group Notes (Signed)
LCSW Group Therapy Note  12/27/2018 1:15pm  Type of Therapy and Topic:  Group Therapy:  Healthy Self Image and Positive Change  Participation Level:  Active   Description of Group:  In this group, patients will compare and contrast their current "I am...." statements to the visions they identify as desirable for their lives.  Patients discuss fears and how they can make positive changes in their cognitions that will positively impact their behaviors.  Facilitator played a motivational 3-minute speech and patients were left with the task of thinking about what "I am...." statements they can start using in their lives immediately.  Therapeutic Goals: 1. Patient will state their current self-perception as expressed in an "I Am" statement 2. Patient will contrast this with their desired vision for their live 3. Patient will identify 3 fears that negatively impact their behavior 4. Patient will discuss cognitive distortions that stem from their fears 5. Patient will verbalize statements that challenge their cognitive distortions  Summary of Patient Progress:  The patient reported that  she feels "great." The patient stated, "I am gifted" Patient discussed her fears and how he can make positive changes in their cognitions that will positively impact her behaviors. Patient was able to discuss and process cognitive distortions that stem from her fears. Patient actively and appropriately engaged in the group. Patient was able to provide support and validation to other group members. Patient practiced active listening when interacting with the facilitator and other group members.   Therapeutic Modalities Cognitive Behavioral Therapy Motivational Interviewing  Madison [name]  CUEBAS-COLON, LCSW 12/27/2018 12:30 PM

## 2018-12-27 NOTE — Plan of Care (Signed)
D: Patient denies SI/HI/AVH. Patient is mildly confused. Patient is frequently at nurses station with requests. Patient is intrusive at times. Patient is easily redirectable and follows commands. Patient is worried, and is suspicious of scheduled medications.  A: Patient was assessed by this nurse. Patient was oriented to unit. Patient's safety was maintained on unit. Q x 15 minute observation checks were completed for safety. Patient care plan was reviewed. Patient was offered support and encouragement. Patient was encourage to attend groups, participate in unit activities and continue with plan of care.    R: Patient has no complaints of pain at this time. Patient is receptive to treatment and safety maintained on unit.   Problem: Education: Goal: Knowledge of [facility] Education information/materials will improve Outcome: Not Progressing   Problem: Coping: Goal: Ability to verbalize frustrations and anger appropriately will improve Outcome: Not Progressing Goal: Ability to demonstrate self-control will improve Outcome: Not Progressing   Problem: Safety: Goal: Periods of time without injury will increase Outcome: Not Progressing

## 2018-12-28 NOTE — Plan of Care (Signed)
  Pt lack insight of her surrounding and repeating one thing over and over again and very difficult to re-direct. At one point, pt was up  talking rapidly and getting loud. MD on call called to see pt can received PRN to help her sleep but MD did not feel comfortable giving pt anything due to renal condition.  Pt finally went to bed and currently resting in bed.  To injury noted this shift, compliance with all HS medications. Will continue to monitor with Q-15 minute safety checks.    Problem: Coping: Goal: Ability to verbalize frustrations and anger appropriately will improve Outcome: Progressing Goal: Ability to demonstrate self-control will improve Outcome: Progressing   Problem: Safety: Goal: Periods of time without injury will increase Outcome: Progressing   Problem: Health Behavior/Discharge Planning: Goal: Compliance with prescribed medication regimen will improve Outcome: Progressing   Problem: Self-Care: Goal: Ability to participate in self-care as condition permits will improve Outcome: Progressing

## 2018-12-28 NOTE — Progress Notes (Signed)
This writer felt the thrill and listened to the bruit of patient's fistula in her left upper arm.

## 2018-12-28 NOTE — Progress Notes (Signed)
[facility] MD Progress Note  12/28/2018 8:18 AM Madison [name]  MRN:  [id number] Subjective: 68yo F, who presents to [facility] inpatient psych unit due to exacerbation of bipolar disorder, currently manic episode in settings of recent family loss.  Patient seen, chart reviewed.  Patient still appears manic - hyperactive, hyperverbal, difficult to redirect. She is making multiple requests at a nursing station.  On interview today, she reports feeling "good. I am ready to go home".  She demands to discharge her and send her back to [place] where she has dialysis Mon-Wed-Fri. She is tangential with flight of ideas, but with often redirection she is able to keep a conversation; she again tells the story that she got depressed after her mother passed away two weeks and "social services brought me here to get better". She states "I am fine now. I am not depressed as I was to". She denies suicidal or homicidal ideations, denies hallucinations. She is medication-compliant and denies side effects from her psych medications.  Principal Problem: Bipolar affective disorder, current episode manic with psychotic symptoms ([place]) Diagnosis: Principal Problem:   Bipolar affective disorder, current episode manic with psychotic symptoms ([place]) Active Problems:   End stage renal disease ([place])  Total Time spent with patient: 20 minutes  Past Psychiatric History: see H&P  Past Medical History:  Past Medical History:  Diagnosis Date  . Anxiety   . ESRD (end stage renal disease) on dialysis ([place])   . Neuropathy   . Schizophrenia [facility])     Past Surgical History:  Procedure Laterality Date  . CHOLECYSTECTOMY    . COLONOSCOPY N/A 01/20/2016   Dr. Oneida Alar: 12 mm tubular adenoma removed from the transverse colon, 4 hyperplastic polyps removed from the rectum and sigmoid colon.  Next colonoscopy planned for April 2020.  Marland Kitchen ESOPHAGOGASTRODUODENOSCOPY N/A 12/19/2017   Procedure: ESOPHAGOGASTRODUODENOSCOPY (EGD);  Surgeon:  [name], MD;  Location: [facility];  Service: Endoscopy;  Laterality: N/A;  8:30am  . fistula left arm    . GIVENS CAPSULE STUDY N/A 01/14/2018   Procedure: GIVENS CAPSULE STUDY;  Surgeon: [name], MD;  Location: [facility];  Service: Endoscopy;  Laterality: N/A;  7:30am  . HEMICOLECTOMY Right   . TUBAL LIGATION     Family History:  Family History  Problem Relation Age of Onset  . Alzheimer's disease Mother   . Cancer Mother        pancreatic  . Prostate cancer Father   . Kidney failure Sister   . Breast cancer Neg Hx   . Colon cancer Neg Hx    Family Psychiatric  History: see H&P Social History:  Social History   Substance and Sexual Activity  Alcohol Use No     Social History   Substance and Sexual Activity  Drug Use No    Social History   Socioeconomic History  . Marital status: Single    Spouse name: Not on file  . Number of children: Not on file  . Years of education: Not on file  . Highest education level: Not on file  Occupational History  . Not on file  Social Needs  . Financial resource strain: Not on file  . Food insecurity:    Worry: Not on file    Inability: Not on file  . Transportation needs:    Medical: Not on file    Non-medical: Not on file  Tobacco Use  . Smoking status: Never Smoker  . Smokeless tobacco: Never Used  Substance and Sexual Activity  . Alcohol use: No  . Drug use: No  . Sexual activity: Yes  Lifestyle  . Physical activity:    Days per week: Not on file    Minutes per session: Not on file  . Stress: Not on file  Relationships  . Social connections:    Talks on phone: Not on file    Gets together: Not on file    Attends religious service: Not on file    Active member of club or organization: Not on file    Attends meetings of clubs or organizations: Not on file    Relationship status: Not on file  Other Topics Concern  . Not on file  Social History Narrative  . Not on file   Additional Social  History:                         Sleep: Fair  Appetite:  Fair  Current Medications: Current Facility-Administered Medications  Medication Dose Route Frequency Provider Last Rate Last Dose  . acetaminophen (TYLENOL) tablet 650 mg  650 mg Oral Q6H PRN Clapacs, John T, MD      . alum & mag hydroxide-simeth (MAALOX/MYLANTA) 200-200-20 MG/5ML suspension 30 mL  30 mL Oral Q4H PRN Clapacs, John T, MD      . Chlorhexidine Gluconate Cloth 2 % PADS 6 each  6 each Topical Q0600 Murlean Iba, MD      . gabapentin (NEURONTIN) capsule 300 mg  300 mg Oral QHS Clapacs, John T, MD   300 mg at 12/27/18 2110  . lidocaine-prilocaine (EMLA) cream   Topical Q M,W,F Singh, Harmeet, MD      . magnesium hydroxide (MILK OF MAGNESIA) suspension 30 mL  30 mL Oral Daily PRN Clapacs, John T, MD      . mirtazapine (REMERON) tablet 30 mg  30 mg Oral QHS Clapacs, John T, MD   30 mg at 12/27/18 2110  . multivitamin with minerals tablet 1 tablet  1 tablet Oral Daily Clapacs, Madie Reno, MD   1 tablet at 12/27/18 9983  . OLANZapine (ZYPREXA) tablet 20 mg  20 mg Oral QHS Clapacs, Madie Reno, MD   20 mg at 12/27/18 2110  . OLANZapine (ZYPREXA) tablet 5 mg  5 mg Oral Daily Clapacs, Madie Reno, MD   5 mg at 12/27/18 3825  . pantoprazole (PROTONIX) EC tablet 40 mg  40 mg Oral Daily Clapacs, Madie Reno, MD   40 mg at 12/27/18 0723  . thiamine (VITAMIN B-1) tablet 50 mg  50 mg Oral Daily Clapacs, Madie Reno, MD   50 mg at 12/27/18 0539    Lab Results: No results found for this or any previous visit (from the past 48 hour(s)).  Blood Alcohol level:  Lab Results  Component Value Date   ETH <10 01/14/2019   ETH <5 76/73/4193    Metabolic Disorder Labs: No results found for: HGBA1C, MPG No results found for: PROLACTIN No results found for: CHOL, TRIG, HDL, CHOLHDL, VLDL, LDLCALC  Physical Findings: AIMS:  , ,  ,  ,    CIWA:    COWS:     Musculoskeletal: Strength & Muscle Tone: within normal limits Gait & Station:  normal Patient leans: N/A  Psychiatric Specialty Exam: Physical Exam  ROS  Blood pressure (!) 146/68, pulse 82, temperature 98.4 F (36.9 C), temperature source Oral, resp. rate 18, height 5\' 4"  (1.626 m), weight 61.5 kg, SpO2 100 %.Body  mass index is 23.26 kg/m.  General Appearance: Casual  Eye Contact:  Good  Speech:  Pressured  Volume:  Normal  Mood:  Euthymic  Affect:  Slightly agitated  Thought Process:  Coherent  Orientation:  Other:  self and place, not date and time  Thought Content:  Rumination and Tangential  Suicidal Thoughts:  No  Homicidal Thoughts:  No  Memory:  Immediate;   Poor Recent;   Fair Remote;   Fair  Judgement:  Other:  limited  Insight:  Shallow  Psychomotor Activity:  Restlessness  Concentration:  Concentration: Poor and Attention Span: Poor  Recall:  Poor  Fund of Knowledge:  Fair  Language:  Fair  Akathisia:  No  Handed:  Right  AIMS (if indicated):     Assets:  Desire for Improvement  ADL's:  Intact  Cognition:  Impaired,  Mild  Sleep:  Number of Hours: 1.5     Treatment Plan Summary: Daily contact with patient to assess and evaluate symptoms and progress in treatment   68yo F, who presents to [facility] inpatient psych unit due to exacerbation of bipolar disorder, currently manic episode in settings of recent family loss.  Patient still appears manic. She slept 1.5 hours last night. She is hyperactive, hyper talkative, tangential, with flight of ideas. She has limited judgment and poor insight in her mental condition. She denies any current mental health complaints, denies unsafe thoughts; she reports medication-compliance and denies side effects from her psych medications. The current dose of antipsychotic is high; will continue current psych medications for now.  Impression: Bipolar disorder, currently manic.  Plan: -continue inpatient psych admission; 15-minute checks; daily contact with patient to assess and evaluate symptoms and  progress in treatment; psychoeducation.   -continue scheduled psych medications: Zyprexa 20mg  PO QHS, Remeron 30mg  PO QHS.  -continue PRN medications.   -Disposition: to be determined.  Larita Fife, MD 12/28/2018, 8:18 AM

## 2018-12-28 NOTE — Plan of Care (Signed)
D- Patient alert and oriented x2, however, she is disoriented to time and place. Patient presents in a pleasant, but preoccupied mood on assessment. Patient stated that she didn't sleep last night because "I stayed up all night reading that book and I got my rights". Patient has been talking all day long, majority of the time to herself about her childhood and "the dialysis place". Patient did not endorse any signs/symptoms of depression/anxiety to this Probation officer. Patient also denied SI, HI, AVH, and pain at this time. Patient had no stated goals for today.  A- Scheduled medications administered to patient, per MD orders. Support and encouragement provided.  Routine safety checks conducted every 15 minutes.  Patient informed to notify staff with problems or concerns.  R- No adverse drug reactions noted. Patient contracts for safety at this time. Patient compliant with medications and treatment plan. Patient receptive, calm, and cooperative. Patient interacts well with others on the unit.  Patient remains safe at this time.  Problem: Education: Goal: Knowledge of [place] [facility] Education information/materials will improve Outcome: Progressing   Problem: Coping: Goal: Ability to verbalize frustrations and anger appropriately will improve Outcome: Progressing Goal: Ability to demonstrate self-control will improve Outcome: Progressing   Problem: Safety: Goal: Periods of time without injury will increase Outcome: Progressing   Problem: Health Behavior/Discharge Planning: Goal: Compliance with prescribed medication regimen will improve Outcome: Progressing   Problem: Self-Care: Goal: Ability to participate in self-care as condition permits will improve Outcome: Progressing   Problem: Education: Goal: Ability to state activities that reduce stress will improve Outcome: Progressing   Problem: Coping: Goal: Ability to identify and develop effective coping behavior will improve Outcome:  Progressing   Problem: Self-Concept: Goal: Ability to identify factors that promote anxiety will improve Outcome: Progressing Goal: Level of anxiety will decrease Outcome: Progressing Goal: Ability to modify response to factors that promote anxiety will improve Outcome: Progressing

## 2018-12-28 NOTE — [facility] Group Notes (Signed)
LCSW Group Therapy Note 12/28/2018 1:15pm  Type of Therapy and Topic: Group Therapy: Feelings Around Returning Home & Establishing a Supportive Framework and Supporting Oneself When Supports Not Available  Participation Level: Active  Description of Group:  Patients first processed thoughts and feelings about upcoming discharge. These included fears of upcoming changes, lack of change, new living environments, judgements and expectations from others and overall stigma of mental health issues. The group then discussed the definition of a supportive framework, what that looks and feels like, and how do to discern it from an unhealthy non-supportive network. The group identified different types of supports as well as what to do when your family/friends are less than helpful or unavailable  Therapeutic Goals  1. Patient will identify one healthy supportive network that they can use at discharge. 2. Patient will identify one factor of a supportive framework and how to tell it from an unhealthy network. 3. Patient able to identify one coping skill to use when they do not have positive supports from others. 4. Patient will demonstrate ability to communicate their needs through discussion and/or role plays.  Summary of Patient Progress:  The patient reported she feels "great today." Pt engaged during group session. As patients processed their anxiety about discharge and described healthy supports patient shared she is ready to be discharge.  Patients identified at least one self-care tool they were willing to use after discharge.   Therapeutic Modalities Cognitive Behavioral Therapy Motivational Interviewing   Shashank Kwasnik  CUEBAS-COLON, LCSW 12/28/2018 9:31 AM

## 2018-12-29 DIAGNOSIS — F312 Bipolar disorder, current episode manic severe with psychotic features: Secondary | ICD-10-CM | POA: Diagnosis not present

## 2018-12-29 LAB — HEPATITIS PANEL, ACUTE
HCV Ab: 0.2 s/co ratio (ref 0.0–0.9)
Hep A IgM: NEGATIVE
Hep B C IgM: NEGATIVE
Hepatitis B Surface Ag: NEGATIVE

## 2018-12-29 LAB — RENAL FUNCTION PANEL
Albumin: 3.4 g/dL — ABNORMAL LOW (ref 3.5–5.0)
Anion gap: 16 — ABNORMAL HIGH (ref 5–15)
BUN: 100 mg/dL — ABNORMAL HIGH (ref 8–23)
CO2: 23 mmol/L (ref 22–32)
Calcium: 10 mg/dL (ref 8.9–10.3)
Chloride: 93 mmol/L — ABNORMAL LOW (ref 98–111)
Creatinine, Ser: 9.82 mg/dL — ABNORMAL HIGH (ref 0.44–1.00)
GFR calc Af Amer: 4 mL/min — ABNORMAL LOW (ref >60)
GFR calc non Af Amer: 4 mL/min — ABNORMAL LOW (ref >60)
Glucose, Bld: 127 mg/dL — ABNORMAL HIGH (ref 70–99)
Phosphorus: 3.2 mg/dL (ref 2.5–4.6)
Potassium: 5.6 mmol/L — ABNORMAL HIGH (ref 3.5–5.1)
Sodium: 132 mmol/L — ABNORMAL LOW (ref 135–145)

## 2018-12-29 LAB — GLUCOSE, CAPILLARY: Glucose-Capillary: 114 mg/dL — ABNORMAL HIGH (ref 70–99)

## 2018-12-29 MED ORDER — TEMAZEPAM 15 MG PO CAPS
15.0000 mg | ORAL_CAPSULE | Freq: Every day | ORAL | Status: DC
  Administered 2018-12-29 – 2019-01-08 (×11): 15 mg via ORAL
  Filled 2018-12-29 (×11): qty 1

## 2018-12-29 MED ORDER — SENNA 8.6 MG PO TABS
1.0000 | ORAL_TABLET | Freq: Every evening | ORAL | Status: DC | PRN
  Administered 2018-12-29: 17.2 mg via ORAL
  Filled 2018-12-29: qty 2

## 2018-12-29 NOTE — Progress Notes (Signed)
[facility]  ROUNDING NOTE   Subjective:   Seen and examined on hemodialysis. Tolerating treatment well. UF goal of 1.5 liters.   Manic with tangental thoughts and speech.     HEMODIALYSIS FLOWSHEET:  Blood Flow Rate (mL/min): 400 mL/min Arterial Pressure (mmHg): -200 mmHg Venous Pressure (mmHg): 120 mmHg Transmembrane Pressure (mmHg): 40 mmHg Ultrafiltration Rate (mL/min): 500 mL/min Dialysate Flow Rate (mL/min): 600 ml/min Conductivity: Machine : 14 Conductivity: Machine : 14 Dialysis Fluid Bolus: Normal Saline Bolus Amount (mL): 250 mL(rinseback)    Objective:  Vital signs in last 24 hours:  Temp:  [98.2 F (36.8 C)-98.4 F (36.9 C)] (P) 98.4 F (36.9 C) (04/06 0913) Pulse Rate:  [94-100] (P) 96 (04/06 0913) Resp:  [16-20] (P) 20 (04/06 0913) BP: (144)/(79) (P) 166/79 (04/06 0913) SpO2:  [96 %-97 %] (P) 96 % (04/06 0913) Weight:  [61.8 kg] 61.8 kg (04/06 0812)  Weight change:  Filed Weights   12/25/18 1504 12/26/18 1045 12/29/18 0812  Weight: 57.2 kg 61.5 kg 61.8 kg    Intake/Output: No intake/output data recorded.   Intake/Output this shift:  No intake/output data recorded.  Physical Exam: General: NAD, seated in a chair  Head: Normocephalic, atraumatic. Moist oral mucosal membranes  Eyes: Anicteric, PERRL  Neck: Supple, trachea midline  Lungs:  Clear to auscultation  Heart: Regular rate and rhythm  Abdomen:  Soft, nontender,   Extremities: no peripheral edema.  Neurologic: Nonfocal, moving all four extremities  Skin: No lesions  Access: Left AVG    Basic Metabolic Panel: Recent Labs  Lab 01/20/2019 2226  NA 136  K 4.0  CL 92*  CO2 30  GLUCOSE 89  BUN 15  CREATININE 4.39*  CALCIUM 9.5    Liver Function Tests: Recent Labs  Lab 01/22/2019 2226  AST 33  ALT 18  ALKPHOS 78  BILITOT 0.7  PROT 8.3*  ALBUMIN 3.9   No results for input(s): LIPASE, AMYLASE in the last 168 hours. Recent Labs  Lab 12/28/2018 2334  AMMONIA 39*     CBC: Recent Labs  Lab 01/08/2019 2226  WBC 6.2  NEUTROABS 4.0  HGB 10.9*  HCT 34.5*  MCV 94.0  PLT 218    Cardiac Enzymes: Recent Labs  Lab 01/11/2019 2226  TROPONINI <0.03    BNP: Invalid input(s): POCBNP  CBG: Recent Labs  Lab 12/29/18 0825  GLUCAP 114*    Microbiology: Results for orders placed or performed during the hospital encounter of 08/15/17  MRSA PCR Screening     Status: Abnormal   Collection Time: 08/16/17  8:28 PM  Result Value Ref Range Status   MRSA by PCR POSITIVE (A) NEGATIVE Final    Comment:        The GeneXpert MRSA Assay (FDA approved for NASAL specimens only), is one component of a comprehensive MRSA colonization surveillance program. It is not intended to diagnose MRSA infection nor to guide or monitor treatment for MRSA infections. RESULT CALLED TO, READ BACK BY AND VERIFIED WITH: CRYSTAL BASS AT 2146 08/16/17.PMH     Coagulation Studies: No results for input(s): LABPROT, INR in the last 72 hours.  Urinalysis: No results for input(s): COLORURINE, LABSPEC, PHURINE, GLUCOSEU, HGBUR, BILIRUBINUR, KETONESUR, PROTEINUR, UROBILINOGEN, NITRITE, LEUKOCYTESUR in the last 72 hours.  Invalid input(s): APPERANCEUR    Imaging: No results found.   Medications:    . Chlorhexidine Gluconate Cloth  6 each Topical Q0600  . gabapentin  300 mg Oral QHS  . lidocaine-prilocaine   Topical Q  M,W,F  . mirtazapine  30 mg Oral QHS  . multivitamin with minerals  1 tablet Oral Daily  . OLANZapine  20 mg Oral QHS  . OLANZapine  5 mg Oral Daily  . pantoprazole  40 mg Oral Daily  . thiamine  50 mg Oral Daily   acetaminophen, alum & mag hydroxide-simeth, magnesium hydroxide  Assessment/ Plan:  [name] is a 68 y.o. black female with end-stage renal disease, bipolar disorder, hypertension was admitted on 12/25/2018 with manic symptoms.   MWF [facility]   1.  End-stage renal disease: seen and examined on  hemodialysis treatment. Tolerating treatment well.  - Continue MWF schedule - Request outpatient records.   2.  Anemia of chronic kidney disease: hemoglobin 10.9. Mircera as outpatient - EPO with HD treatments.   3.  Secondary hyperparathyroidism. - Continue home dose of Auryxia  4. Hypertension: 156/74. Not currently on blood pressure agents.    LOS: 4 Kadar Chance 4/6/20209:33 AM

## 2018-12-29 NOTE — Plan of Care (Signed)
Patient is preoccupied, tangential ,present  with flight of ideas and occasional with loose associate, annoying and intrusive to other residence ,  disorientation , easily redirected to safety, medication is given and accepted, patient has no complaints , sleep is continuous with out interruptions, safety is maintained and 15 minutes rounding is in place.   Problem: Education: Goal: Knowledge of [place] [facility] Education information/materials will improve Outcome: Progressing   Problem: Coping: Goal: Ability to verbalize frustrations and anger appropriately will improve Outcome: Progressing Goal: Ability to demonstrate self-control will improve Outcome: Progressing   Problem: Safety: Goal: Periods of time without injury will increase Outcome: Progressing   Problem: Health Behavior/Discharge Planning: Goal: Compliance with prescribed medication regimen will improve Outcome: Progressing   Problem: Self-Care: Goal: Ability to participate in self-care as condition permits will improve Outcome: Progressing   Problem: Education: Goal: Ability to state activities that reduce stress will improve Outcome: Progressing   Problem: Coping: Goal: Ability to identify and develop effective coping behavior will improve Outcome: Progressing   Problem: Self-Concept: Goal: Ability to identify factors that promote anxiety will improve Outcome: Progressing Goal: Level of anxiety will decrease Outcome: Progressing Goal: Ability to modify response to factors that promote anxiety will improve Outcome: Progressing

## 2018-12-29 NOTE — Progress Notes (Signed)
Post HD Assessment   Pt removed 1521mL during dialysis today. Tolerated well. Deaccessed left upper arm AV fistula + achieved hemostasis. Pt continues tangential/continual speech in manic episode throughout tx with few breaks between streams of thought.     12/29/18 1228  Neurological  Level of Consciousness Alert  Orientation Level Oriented to person;Oriented to place  Respiratory  Respiratory Pattern Regular  Cough None  Cardiac  Pulse Regular  Heart Sounds S1, S2  Vascular  R Dorsalis Pedis Pulse +2  L Dorsalis Pedis Pulse +2  Integumentary  Integumentary (WDL) X  Skin Color Appropriate for ethnicity  Additional Integumentary Comments HD pt (left upper arm AV fistula)  Musculoskeletal  Musculoskeletal (WDL) WDL  GU Assessment  Genitourinary (WDL) X  Genitourinary Symptoms Oliguria (HD pt)  Psychosocial  Patient Behaviors Cooperative;Anxious;Restless

## 2018-12-29 NOTE — Plan of Care (Signed)
D- Patient alert and oriented x2, however, she is disoriented to time and place. Patient presents in a pleasant, but preoccupied mood on assessment. It was reported to this writer that patient slept better last night. Patient continues to be preoccupied with going home today and going back to "[place] to the dialysis place". Patient did not endorse any signs/symptoms of depression/anxiety to this Probation officer. Patient also denied SI, HI, AVH, and pain at this time. Patient had no stated goals for today.  A- Support and encouragement provided.  Routine safety checks conducted every 15 minutes. Patient informed to notify staff with problems or concerns.  R- No adverse drug reactions noted. Patient contracts for safety at this time. Patient compliant with medications and treatment plan. Patient receptive, calm, and cooperative. Patient interacts well with others on the unit.  Patient remains safe at this time.  Problem: Education: Goal: Knowledge of [place] [facility] Education information/materials will improve Outcome: Progressing   Problem: Coping: Goal: Ability to verbalize frustrations and anger appropriately will improve Outcome: Progressing Goal: Ability to demonstrate self-control will improve Outcome: Progressing   Problem: Safety: Goal: Periods of time without injury will increase Outcome: Progressing   Problem: Health Behavior/Discharge Planning: Goal: Compliance with prescribed medication regimen will improve Outcome: Progressing   Problem: Self-Care: Goal: Ability to participate in self-care as condition permits will improve Outcome: Progressing   Problem: Education: Goal: Ability to state activities that reduce stress will improve Outcome: Progressing   Problem: Coping: Goal: Ability to identify and develop effective coping behavior will improve Outcome: Progressing   Problem: Self-Concept: Goal: Ability to identify factors that promote anxiety will improve Outcome:  Progressing Goal: Level of anxiety will decrease Outcome: Progressing Goal: Ability to modify response to factors that promote anxiety will improve Outcome: Progressing

## 2018-12-29 NOTE — Progress Notes (Signed)
Patient's morning medications will be held until after Dialysis.

## 2018-12-29 NOTE — Progress Notes (Signed)
Post HD Tx   12/29/18 1220  Vital Signs  Pulse Rate 90  Pulse Rate Source Monitor  Resp 16  BP 135/75  BP Location Right Arm  BP Method Automatic  Patient Position (if appropriate) Sitting  Oxygen Therapy  SpO2 100 %  O2 Device Room Air  Dialysis Weight  Weight 60.4 kg  Type of Weight Post-Dialysis  Post-Hemodialysis Assessment  Rinseback Volume (mL) 250 mL  KECN 58.3 V  Dialyzer Clearance Lightly streaked  Duration of HD Treatment -hour(s) 3 hour(s)  Hemodialysis Intake (mL) 500 mL  UF Total -Machine (mL) 2011 mL  Net UF (mL) 1511 mL  Tolerated HD Treatment Yes  Post-Hemodialysis Comments  (hemostasis acheived. Deaccessed AVF (left))  AVG/AVF Arterial Site Held (minutes) 10 minutes  AVG/AVF Venous Site Held (minutes) 10 minutes

## 2018-12-29 NOTE — [facility] Group Notes (Signed)
Overcoming Obstacles  12/29/2018 1PM  Type of Therapy and Topic:  Group Therapy:  Overcoming Obstacles  Participation Level:  Minimal    Description of Group:    In this group patients will be encouraged to explore what they see as obstacles to their own wellness and recovery. They will be guided to discuss their thoughts, feelings, and behaviors related to these obstacles. The group will process together ways to cope with barriers, with attention given to specific choices patients can make. Each patient will be challenged to identify changes they are motivated to make in order to overcome their obstacles. This group will be process-oriented, with patients participating in exploration of their own experiences as well as giving and receiving support and challenge from other group members.   Therapeutic Goals: 1. Patient will identify personal and current obstacles as they relate to admission. 2. Patient will identify barriers that currently interfere with their wellness or overcoming obstacles.  3. Patient will identify feelings, thought process and behaviors related to these barriers. 4. Patient will identify two changes they are willing to make to overcome these obstacles:      Summary of Patient Progress Pt came to group late. Pt displayed disorganized thought and speech. Pt interjected when others were speaking and required great deal of redirection.    Therapeutic Modalities:   Cognitive Behavioral Therapy Solution Focused Therapy Motivational Interviewing Relapse Prevention Therapy    Sanjuana Kava, MSW, [place] 12/29/2018 2:04 PM

## 2018-12-29 NOTE — Progress Notes (Signed)
Recreation Therapy Notes  Date: 12/29/2018  Time: 9:30 am   Location: Craft room   Behavioral response: N/A   Intervention Topic: Self-esteem  Discussion/Intervention: Patient did not attend group.   Clinical Observations/Feedback:  Patient did not attend group.   Meir Elwood LRT/CTRS        Shondell Fabel 12/29/2018 10:51 AM

## 2018-12-29 NOTE — Progress Notes (Signed)
Pre hemodialysis report received from D. Ravenell,  RN. Received patient in RDU via wheelchair. Awake, alert and verbally responsive. No actute distress noted. + Bruit/thrill noted to LUE AVF. Accessed AVF with two 15g needles without difficulty. Hemodialysis treatment initiated at 0913 via AVF. Plan for 3 hours hemodialysis with UF of 1-1.5    12/29/18 0913  Hand-Off documentation  Report received from (Full Name) Lyda Kalata, RN  Vital Signs  Temp 98.4 F (36.9 C)  Temp Source Oral  Pulse Rate 96  Pulse Rate Source Monitor  Resp 20  BP (!) 166/79  BP Location Right Arm  BP Method Automatic  Patient Position (if appropriate) Sitting  Oxygen Therapy  SpO2 96 %  O2 Device Room Air  Pain Assessment  Pain Scale 0-10  Pain Score 0  Time-Out for Hemodialysis  What Procedure? dialysis  Pt Identifiers(min of two) First/Last Name;MRN/Account#  Correct Site? Yes  Correct Side? Yes  Correct Procedure? Yes  Consents Verified? Yes  Rad Studies Available? N/A  Engineer, civil (consulting) Number 4  Station Number 4  UF/Alarm Test Passed  Conductivity: Meter 14  Conductivity: Machine  13.9  pH 7.4  Reverse Osmosis main  Normal Saline Lot Number [id number]  Dialyzer Lot Number [id number]  Disposable Set Lot Number [id number]  Machine Temperature 98.6 F (37 C)  Musician and Audible Yes  Blood Lines Intact and Secured Yes  Pre Treatment Patient Checks  Vascular access used during treatment Fistula  Patient is receiving dialysis in a chair Yes  Hemodialysis Consent Verified Yes  Hemodialysis Standing Orders Initiated Yes  ECG (Telemetry) Monitor On Yes  Prime Ordered Normal Saline  Length of  DialysisTreatment -hour(s) 3 Hour(s)  Dialyzer Elisio 17H NR  Dialysate 3K, 2.5 Ca  Dialysate Flow Ordered 600  Blood Flow Rate Ordered 400 mL/min  Ultrafiltration Goal 1500 Liters  During Hemodialysis Assessment  Blood Flow Rate (mL/min) 400 mL/min  Arterial Pressure (mmHg) -200 mmHg   Venous Pressure (mmHg) 120 mmHg  Transmembrane Pressure (mmHg) 60 mmHg  Ultrafiltration Rate (mL/min) 670 mL/min  Dialysate Flow Rate (mL/min) 600 ml/min  Conductivity: Machine  13.9  HD Safety Checks Performed Yes  Dialysis Fluid Bolus Normal Saline  Bolus Amount (mL) 250 mL (prime)  Intra-Hemodialysis Comments Tx initiated  L as tolerated.

## 2018-12-29 NOTE — Progress Notes (Signed)
[facility] MD Progress Note  12/29/2018 5:21 PM BERNARDA ERCK  MRN:  [id number] Subjective: Follow-up for this patient with bipolar disorder.  Patient remains hyperverbal and intrusive agitated although honestly I think it is better today than it has been the last few days.  Her complaints are all confused and focused on wanting to go back home because she gets her dialysis at home.  No physical complaints.  Affect anxious and confused.  Tolerating medicine however.  No sign of any acute side effects. Principal Problem: Bipolar affective disorder, current episode manic with psychotic symptoms ([place]) Diagnosis: Principal Problem:   Bipolar affective disorder, current episode manic with psychotic symptoms ([place]) Active Problems:   End stage renal disease ([place])  Total Time spent with patient: 30 minutes  Past Psychiatric History: History of bipolar disorder multiple prior hospitalizations  Past Medical History:  Past Medical History:  Diagnosis Date  . Anxiety   . ESRD (end stage renal disease) on dialysis ([place])   . Neuropathy   . Schizophrenia [facility])     Past Surgical History:  Procedure Laterality Date  . CHOLECYSTECTOMY    . COLONOSCOPY N/A 01/20/2016   Dr. Oneida Alar: 12 mm tubular adenoma removed from the transverse colon, 4 hyperplastic polyps removed from the rectum and sigmoid colon.  Next colonoscopy planned for April 2020.  Marland Kitchen ESOPHAGOGASTRODUODENOSCOPY N/A 12/19/2017   Procedure: ESOPHAGOGASTRODUODENOSCOPY (EGD);  Surgeon: [name], MD;  Location: [facility];  Service: Endoscopy;  Laterality: N/A;  8:30am  . fistula left arm    . GIVENS CAPSULE STUDY N/A 01/14/2018   Procedure: GIVENS CAPSULE STUDY;  Surgeon: [name], MD;  Location: [facility];  Service: Endoscopy;  Laterality: N/A;  7:30am  . HEMICOLECTOMY Right   . TUBAL LIGATION     Family History:  Family History  Problem Relation Age of Onset  . Alzheimer's disease Mother   . Cancer Mother        pancreatic   . Prostate cancer Father   . Kidney failure Sister   . Breast cancer Neg Hx   . Colon cancer Neg Hx    Family Psychiatric  History: None Social History:  Social History   Substance and Sexual Activity  Alcohol Use No     Social History   Substance and Sexual Activity  Drug Use No    Social History   Socioeconomic History  . Marital status: Single    Spouse name: Not on file  . Number of children: Not on file  . Years of education: Not on file  . Highest education level: Not on file  Occupational History  . Not on file  Social Needs  . Financial resource strain: Not on file  . Food insecurity:    Worry: Not on file    Inability: Not on file  . Transportation needs:    Medical: Not on file    Non-medical: Not on file  Tobacco Use  . Smoking status: Never Smoker  . Smokeless tobacco: Never Used  Substance and Sexual Activity  . Alcohol use: No  . Drug use: No  . Sexual activity: Yes  Lifestyle  . Physical activity:    Days per week: Not on file    Minutes per session: Not on file  . Stress: Not on file  Relationships  . Social connections:    Talks on phone: Not on file    Gets together: Not on file    Attends religious service: Not on  file    Active member of club or organization: Not on file    Attends meetings of clubs or organizations: Not on file    Relationship status: Not on file  Other Topics Concern  . Not on file  Social History Narrative  . Not on file   Additional Social History:                         Sleep: Fair  Appetite:  Fair  Current Medications: Current Facility-Administered Medications  Medication Dose Route Frequency Provider Last Rate Last Dose  . acetaminophen (TYLENOL) tablet 650 mg  650 mg Oral Q6H PRN Pranika Finks T, MD      . alum & mag hydroxide-simeth (MAALOX/MYLANTA) 200-200-20 MG/5ML suspension 30 mL  30 mL Oral Q4H PRN Pressley Barsky T, MD      . Chlorhexidine Gluconate Cloth 2 % PADS 6 each  6 each  Topical Q0600 Murlean Iba, MD      . gabapentin (NEURONTIN) capsule 300 mg  300 mg Oral QHS Yehia Mcbain T, MD   300 mg at 12/28/18 2110  . lidocaine-prilocaine (EMLA) cream   Topical Q M,W,F Singh, Harmeet, MD      . magnesium hydroxide (MILK OF MAGNESIA) suspension 30 mL  30 mL Oral Daily PRN Telisha Zawadzki T, MD      . mirtazapine (REMERON) tablet 30 mg  30 mg Oral QHS Catalyna Reilly, Madie Reno, MD   30 mg at 12/28/18 2109  . multivitamin with minerals tablet 1 tablet  1 tablet Oral Daily Valera Vallas, Madie Reno, MD   1 tablet at 12/29/18 1346  . OLANZapine (ZYPREXA) tablet 20 mg  20 mg Oral QHS Ellyanna Holton, Madie Reno, MD   20 mg at 12/28/18 2109  . OLANZapine (ZYPREXA) tablet 5 mg  5 mg Oral Daily Ayyan Sites T, MD   5 mg at 12/29/18 1346  . pantoprazole (PROTONIX) EC tablet 40 mg  40 mg Oral Daily Onyx Schirmer, Madie Reno, MD   40 mg at 12/29/18 1346  . thiamine (VITAMIN B-1) tablet 50 mg  50 mg Oral Daily Lanah Steines, Madie Reno, MD   50 mg at 12/29/18 1346    Lab Results:  Results for orders placed or performed during the hospital encounter of 12/25/18 (from the past 48 hour(s))  Glucose, capillary     Status: Abnormal   Collection Time: 12/29/18  8:25 AM  Result Value Ref Range   Glucose-Capillary 114 (H) 70 - 99 mg/dL  Renal function panel     Status: Abnormal   Collection Time: 12/29/18  9:10 AM  Result Value Ref Range   Sodium 132 (L) 135 - 145 mmol/L   Potassium 5.6 (H) 3.5 - 5.1 mmol/L   Chloride 93 (L) 98 - 111 mmol/L   CO2 23 22 - 32 mmol/L   Glucose, Bld 127 (H) 70 - 99 mg/dL   BUN 100 (H) 8 - 23 mg/dL   Creatinine, Ser 9.82 (H) 0.44 - 1.00 mg/dL   Calcium 10.0 8.9 - 10.3 mg/dL   Phosphorus 3.2 2.5 - 4.6 mg/dL   Albumin 3.4 (L) 3.5 - 5.0 g/dL   GFR calc non Af Amer 4 (L) >60 mL/min   GFR calc Af Amer 4 (L) >60 mL/min   Anion gap 16 (H) 5 - 15    Comment: Performed at [facility], [address] [place], [place] [address]    Blood Alcohol level:  Lab Results  Component  Value Date   ETH  <10 01/14/2019   ETH <5 58/05/9832    Metabolic Disorder Labs: No results found for: HGBA1C, MPG No results found for: PROLACTIN No results found for: CHOL, TRIG, HDL, CHOLHDL, VLDL, LDLCALC  Physical Findings: AIMS:  , ,  ,  ,    CIWA:    COWS:     Musculoskeletal: Strength & Muscle Tone: within normal limits Gait & Station: normal Patient leans: N/A  Psychiatric Specialty Exam: Physical Exam  Nursing note and vitals reviewed. Constitutional: She appears well-developed and well-nourished.  HENT:  Head: Normocephalic and atraumatic.  Eyes: Pupils are equal, round, and reactive to light. Conjunctivae are normal.  Neck: Normal range of motion.  Cardiovascular: Regular rhythm and normal heart sounds.  Respiratory: Effort normal.  GI: Soft.  Musculoskeletal: Normal range of motion.  Neurological: She is alert.  Skin: Skin is warm and dry.  Psychiatric: Her affect is labile. Her speech is rapid and/or pressured and tangential. She is agitated. She is not aggressive. Thought content is paranoid and delusional. Cognition and memory are impaired. She expresses impulsivity. She expresses no homicidal and no suicidal ideation.    Review of Systems  Constitutional: Negative.   HENT: Negative.   Eyes: Negative.   Respiratory: Negative.   Cardiovascular: Negative.   Gastrointestinal: Negative.   Musculoskeletal: Negative.   Skin: Negative.   Neurological: Negative.   Psychiatric/Behavioral: Negative.     Blood pressure 135/75, pulse 90, temperature 98.6 F (37 C), temperature source Oral, resp. rate 16, height 5\' 4"  (1.626 m), weight 60.4 kg, SpO2 100 %.Body mass index is 22.86 kg/m.  General Appearance: Casual  Eye Contact:  Fair  Speech:  Pressured  Volume:  Increased  Mood:  Anxious  Affect:  Inappropriate and Labile  Thought Process:  Disorganized  Orientation:  Full (Time, Place, and Person)  Thought Content:  Illogical, Delusions and Paranoid Ideation  Suicidal  Thoughts:  No  Homicidal Thoughts:  No  Memory:  Immediate;   Fair Recent;   Poor Remote;   Poor  Judgement:  Impaired  Insight:  Shallow  Psychomotor Activity:  Restlessness  Concentration:  Concentration: Poor  Recall:  Poor  Fund of Knowledge:  Fair  Language:  Fair  Akathisia:  No  Handed:  Right  AIMS (if indicated):     Assets:  Housing Resilience Social Support  ADL's:  Impaired  Cognition:  Impaired,  Mild  Sleep:  Number of Hours: 1.5     Treatment Plan Summary: Daily contact with patient to assess and evaluate symptoms and progress in treatment, Medication management and Plan Continue with antipsychotic medicine as prescribed previously.  May also add some sleeping medicine at night since she is still being logged as sleeping only about an hour and a half at night.  Try giving her some Restoril tonight along with that Zyprexa to see if getting a good night sleep will help to fix things.  Still not ready for discharge based on mania.  Alethia Berthold, MD 12/29/2018, 5:21 PM

## 2018-12-29 NOTE — Progress Notes (Signed)
HD Tx End   12/29/18 1215  Vital Signs  Temp 98.6 F (37 C)  Temp Source Oral  Pulse Rate 86  Pulse Rate Source Monitor  Resp 16  BP 140/78  BP Location Right Arm  BP Method Automatic  Patient Position (if appropriate) Sitting  Oxygen Therapy  SpO2 100 %  O2 Device Room Air  Pain Assessment  Pain Scale 0-10  Pain Score 0  During Hemodialysis Assessment  Blood Flow Rate (mL/min) 200 mL/min  Arterial Pressure (mmHg) -180 mmHg  Venous Pressure (mmHg) 100 mmHg  Transmembrane Pressure (mmHg) 60 mmHg  Ultrafiltration Rate (mL/min) 670 mL/min  Dialysate Flow Rate (mL/min) 600 ml/min  Conductivity: Machine  14  HD Safety Checks Performed Yes  Dialysis Fluid Bolus Normal Saline  Bolus Amount (mL) 250 mL  Intra-Hemodialysis Comments Tolerated well;Tx completed

## 2018-12-29 NOTE — Progress Notes (Signed)
Patient's weight was recorded and she is 136.2 lbs and this writer felt the thrill and heard the bruit of her LAV fistula.

## 2018-12-30 LAB — HEPATITIS B SURFACE ANTIBODY, QUANTITATIVE: Hep B S AB Quant (Post): 7.3 m[IU]/mL — ABNORMAL LOW (ref >9.9)

## 2018-12-30 LAB — HEPATITIS B SURFACE ANTIGEN: Hepatitis B Surface Ag: NEGATIVE

## 2018-12-30 LAB — HEPATITIS B CORE ANTIBODY, TOTAL: Hep B Core Total Ab: POSITIVE — AB

## 2018-12-30 NOTE — Progress Notes (Signed)
Patient is monitored for her safety, patient confused, disoriented and preoccupied , clutters removed from her room, patient is easily redirected to safety, medication is given and accepted , patient is unwilling to engage in activities due to disease process patient has no coping skills , patient is supported  And encouraged no distress.

## 2018-12-30 NOTE — Progress Notes (Signed)
Recreation Therapy Notes   Date: 12/30/2018  Time: 9:30 am  Location: Craft Room  Behavioral response: Hyperverbal, Off topic, redirected  Intervention Topic: Stress  Discussion/Intervention:  Group content on today was focused on stress. The group defined stress and way to cope with stress. Participants expressed how they know when they are stresses out. Individuals described the different ways they have to cope with stress. The group stated reasons why it is important to cope with stress. Patient explained what good stress is and some examples. The group participated in the intervention "Stress Management Jeopardy". Individuals were separated into two group and answered questions related to stress.  Clinical Observations/Feedback:  Patient came to group and explained having a good listener helps with stress. She explained that she deals with stress by being positive. Participant went on a tangent in group explaining that stress gives her a headache and staff wouldn't give her medication as well as her mother is dead and the staff put her mothers obituary in her belongs. Patient required much redirection and could no be quiet more than a minute.Individual got into an argument with peer during group and was redirected by group facilitator. Britton Perkinson LRT/CTRS         Jenelle Drennon 12/30/2018 10:55 AM

## 2018-12-30 NOTE — Plan of Care (Signed)
Patient continues with severe manic phase but is cooperative with taking medicines, but not able to accept orientation to surroundings. With encouragement she does follow some simple commands. Able to verbalize self care for hemodialysis protocol I.e. fluid restriction, protection of vascular access. Positive reinforcement for compliance and cooperative behavior. Problem: Safety: Goal: Periods of time without injury will increase Outcome: Progressing   Problem: Self-Care: Goal: Ability to participate in self-care as condition permits will improve Outcome: Progressing

## 2018-12-30 NOTE — BHH Group Notes (Signed)
[facility] Group Notes:  (Nursing/MHT/Case Management/Adjunct)  Date:  12/30/2018  Time:  10:41 PM  Type of Therapy:  Group Therapy  Participation Level:  Did Not Attend   Madison [name] 12/30/2018, 10:41 PM

## 2018-12-30 NOTE — BHH Group Notes (Signed)
  LCSW Group Therapy Note  12/30/2018 11:45 AM   Type of Therapy/Topic:  Group Therapy:  Feelings about Diagnosis  Participation Level:  Active   Description of Group:   This group will allow patients to explore their thoughts and feelings about diagnoses they have received. Patients will be guided to explore their level of understanding and acceptance of these diagnoses. Facilitator will encourage patients to process their thoughts and feelings about the reactions of others to their diagnosis and will guide patients in identifying ways to discuss their diagnosis with significant others in their lives. This group will be process-oriented, with patients participating in exploration of their own experiences, giving and receiving support, and processing challenge from other group members.   Therapeutic Goals: 1. Patient will demonstrate understanding of diagnosis as evidenced by identifying two or more symptoms of the disorder 2. Patient will be able to express two feelings regarding the diagnosis 3. Patient will demonstrate their ability to communicate their needs through discussion and/or role play  Summary of Patient Progress: Pt was present in group, but had to be sent out of group due to rambling and interrupting other group members. After pt was out of group for about 5 minutes she returned stating "Im going to sit down and shut my mouth". PT was doing well for a little bit, but then started rambling about her childhood and had to be redirected multiple times. Towards the end of group pt was disrespectful calling another pt fat and had to be sent out of group.   Therapeutic Modalities:   Cognitive Behavioral Therapy Brief Therapy Feelings Identification    Evalina Field, MSW, LCSW Clinical Social Work 12/30/2018 11:45 AM

## 2018-12-30 NOTE — Plan of Care (Addendum)
Pt could not answer any of my questions to assess her. Ptwas educated on her care plan but did not verbalize understanding. Pt is attending groups. Collier Bullock RN Problem: Education: Goal: Knowledge of [facility] Education information/materials will improve Outcome: Progressing   Problem: Coping: Goal: Ability to verbalize frustrations and anger appropriately will improve Outcome: Not Progressing Goal: Ability to demonstrate self-control will improve Outcome: Progressing   Problem: Safety: Goal: Periods of time without injury will increase Outcome: Progressing   Problem: Health Behavior/Discharge Planning: Goal: Compliance with prescribed medication regimen will improve Outcome: Progressing   Problem: Self-Care: Goal: Ability to participate in self-care as condition permits will improve Outcome: Progressing   Problem: Self-Care: Goal: Ability to participate in self-care as condition permits will improve Outcome: Progressing   Problem: Education: Goal: Ability to state activities that reduce stress will improve Outcome: Progressing   Problem: Coping: Goal: Ability to identify and develop effective coping behavior will improve Outcome: Progressing   Problem: Self-Concept: Goal: Ability to identify factors that promote anxiety will improve Outcome: Progressing Goal: Level of anxiety will decrease Outcome: Not Progressing Goal: Ability to modify response to factors that promote anxiety will improve Outcome: Progressing

## 2018-12-30 NOTE — BH Specialist Note (Cosign Needed)
Madison [name] is an 68 y.o. female presenting with manic episode, bizarre behavior, pressured speech, flight of ideas. Patient denied SI, HI and psychosis.  Nursing and providers notes were reviewed. This patient suffers from ESRD (end stage renal disease) and is on dialysis.  This provider received a call from the RN on the inpatient unit of behavioral health discussing that the patient is complaining of constipation.  Senna tabs 8.6 to 17.2 mg was prescribed for the patient to have 1-2 tabs at bedtime depending on the severity of her constipation.

## 2018-12-30 NOTE — Progress Notes (Signed)
D- Patient continues manic and disorganized. Mood is rife with anxiety and distress. Focuses on same recall of information as a stressor--mainly her child- and young adulthood.. Denies SI, HI. Quotes. Goal of going home soon is constant. A- Scheduled medications administered to patient, per MD orders. Support and encouragement provided.  Routine safety checks conducted every 15 minutes.  Patient informed to notify staff with problems or concerns. R- No adverse drug reactions noted. Patient contracts for safety at this time. Patient compliant with medications.  Patient remains safe at this time.

## 2018-12-30 NOTE — Progress Notes (Signed)
[facility] MD Progress Note  12/30/2018 11:42 AM Madison [name]  MRN:  [id number]  67yo F, who presents to [facility] inpatient psych unit due to exacerbation of bipolar disorder, currently manic episode in settings of recent family loss.  Patient seen, chart reviewed, case discussed with treatment team.  Subjective: Patient still hyperactive, hyperverbal, difficult to redirect, often seen at a nursing station She attended group, but did not show meaningful perticipation.  On interview, she reports feeling "I am good. I am ready to go home". She is fixated on discharge and her need for dialysis. She reminded that she is still getting dialysis as her usual schedule and in fact got a procedure tomorrow. She demands to send her back to [place]. She is highly tangential and appears not to have any insight in her condition. She realizes that she is in mental hospital because "I got sad after my mama died". She reports not feeling depressed anymore and feeling safe for discharge, then says "I miss my mama, I am so depressed". It is still extremely difficult to follow a patient while talking to her.   Principal Problem: Bipolar affective disorder, current episode manic with psychotic symptoms ([place]) Diagnosis: Principal Problem:   Bipolar affective disorder, current episode manic with psychotic symptoms ([place]) Active Problems:   End stage renal disease ([place])  Total Time spent with patient: 20 minutes  Past Psychiatric History: see H&P  Past Medical History:  Past Medical History:  Diagnosis Date  . Anxiety   . ESRD (end stage renal disease) on dialysis ([place])   . Neuropathy   . Schizophrenia [facility])     Past Surgical History:  Procedure Laterality Date  . CHOLECYSTECTOMY    . COLONOSCOPY N/A 01/20/2016   Dr. Oneida Alar: 12 mm tubular adenoma removed from the transverse colon, 4 hyperplastic polyps removed from the rectum and sigmoid colon.  Next colonoscopy planned for April 2020.  Marland Kitchen  ESOPHAGOGASTRODUODENOSCOPY N/A 12/19/2017   Procedure: ESOPHAGOGASTRODUODENOSCOPY (EGD);  Surgeon: [name], MD;  Location: [facility] ENDO SUITE;  Service: Endoscopy;  Laterality: N/A;  8:30am  . fistula left arm    . GIVENS CAPSULE STUDY N/A 01/14/2018   Procedure: GIVENS CAPSULE STUDY;  Surgeon: [name], MD;  Location: [facility] ENDO SUITE;  Service: Endoscopy;  Laterality: N/A;  7:30am  . HEMICOLECTOMY Right   . TUBAL LIGATION     Family History:  Family History  Problem Relation Age of Onset  . Alzheimer's disease Mother   . Cancer Mother        pancreatic  . Prostate cancer Father   . Kidney failure Sister   . Breast cancer Neg Hx   . Colon cancer Neg Hx    Family Psychiatric  History: see H&P Social History:  Social History   Substance and Sexual Activity  Alcohol Use No     Social History   Substance and Sexual Activity  Drug Use No    Social History   Socioeconomic History  . Marital status: Single    Spouse name: Not on file  . Number of children: Not on file  . Years of education: Not on file  . Highest education level: Not on file  Occupational History  . Not on file  Social Needs  . Financial resource strain: Not on file  . Food insecurity:    Worry: Not on file    Inability: Not on file  . Transportation needs:    Medical: Not on file  Non-medical: Not on file  Tobacco Use  . Smoking status: Never Smoker  . Smokeless tobacco: Never Used  Substance and Sexual Activity  . Alcohol use: No  . Drug use: No  . Sexual activity: Yes  Lifestyle  . Physical activity:    Days per week: Not on file    Minutes per session: Not on file  . Stress: Not on file  Relationships  . Social connections:    Talks on phone: Not on file    Gets together: Not on file    Attends religious service: Not on file    Active member of club or organization: Not on file    Attends meetings of clubs or organizations: Not on file    Relationship status: Not on file   Other Topics Concern  . Not on file  Social History Narrative  . Not on file   Additional Social History:                         Sleep: Fair  Appetite:  Fair  Current Medications: Current Facility-Administered Medications  Medication Dose Route Frequency Provider Last Rate Last Dose  . acetaminophen (TYLENOL) tablet 650 mg  650 mg Oral Q6H PRN Clapacs, John T, MD      . alum & mag hydroxide-simeth (MAALOX/MYLANTA) 200-200-20 MG/5ML suspension 30 mL  30 mL Oral Q4H PRN Lamont Dowdy, NP      . Chlorhexidine Gluconate Cloth 2 % PADS 6 each  6 each Topical Q0600 Murlean Iba, MD      . gabapentin (NEURONTIN) capsule 300 mg  300 mg Oral QHS Clapacs, John T, MD   300 mg at 12/29/18 2133  . lidocaine-prilocaine (EMLA) cream   Topical Q M,W,F Candiss Norse, [place], MD      . mirtazapine (REMERON) tablet 30 mg  30 mg Oral QHS Clapacs, Madie Reno, MD   30 mg at 12/29/18 2133  . multivitamin with minerals tablet 1 tablet  1 tablet Oral Daily Clapacs, Madie Reno, MD   1 tablet at 12/30/18 0756  . OLANZapine (ZYPREXA) tablet 20 mg  20 mg Oral QHS Clapacs, Madie Reno, MD   20 mg at 12/29/18 2133  . OLANZapine (ZYPREXA) tablet 5 mg  5 mg Oral Daily Clapacs, Madie Reno, MD   5 mg at 12/30/18 0756  . pantoprazole (PROTONIX) EC tablet 40 mg  40 mg Oral Daily Clapacs, Madie Reno, MD   40 mg at 12/30/18 0756  . senna (SENOKOT) tablet 8.6-17.2 mg  1-2 tablet Oral QHS PRN Lamont Dowdy, NP   17.2 mg at 12/29/18 2226  . temazepam (RESTORIL) capsule 15 mg  15 mg Oral QHS Clapacs, Madie Reno, MD   15 mg at 12/29/18 2134  . thiamine (VITAMIN B-1) tablet 50 mg  50 mg Oral Daily Clapacs, Madie Reno, MD   50 mg at 12/30/18 0093    Lab Results:  Results for orders placed or performed during the hospital encounter of 12/25/18 (from the past 48 hour(s))  Glucose, capillary     Status: Abnormal   Collection Time: 12/29/18  8:25 AM  Result Value Ref Range   Glucose-Capillary 114 (H) 70 - 99 mg/dL  Hepatitis B surface  antigen     Status: None   Collection Time: 12/29/18  9:10 AM  Result Value Ref Range   Hepatitis B Surface Ag Negative Negative    Comment: (NOTE) Performed At: [facility] [place], [place]  [id number] Rush Farmer MD [id number]   Hepatitis B surface antibody     Status: Abnormal   Collection Time: 12/29/18  9:10 AM  Result Value Ref Range   Hepatitis B-Post 7.3 (L) Immunity>9.9 mIU/mL    Comment: (NOTE) **Verified by repeat analysis**  Status of Immunity                     Anti-HBs Level  ------------------                     -------------- Inconsistent with Immunity                   0.0 - 9.9 Consistent with Immunity                          >9.9 Performed At: [facility] [place], [place] [id number] Rush Farmer MD [id number]   Hepatitis B core antibody, total     Status: Abnormal   Collection Time: 12/29/18  9:10 AM  Result Value Ref Range   Hep B Core Total Ab Positive (A) Negative    Comment: (NOTE) Performed At: [facility] [place], [place] [id number] Rush Farmer MD [id number]   Renal function panel     Status: Abnormal   Collection Time: 12/29/18  9:10 AM  Result Value Ref Range   Sodium 132 (L) 135 - 145 mmol/L   Potassium 5.6 (H) 3.5 - 5.1 mmol/L   Chloride 93 (L) 98 - 111 mmol/L   CO2 23 22 - 32 mmol/L   Glucose, Bld 127 (H) 70 - 99 mg/dL   BUN 100 (H) 8 - 23 mg/dL   Creatinine, Ser 9.82 (H) 0.44 - 1.00 mg/dL   Calcium 10.0 8.9 - 10.3 mg/dL   Phosphorus 3.2 2.5 - 4.6 mg/dL   Albumin 3.4 (L) 3.5 - 5.0 g/dL   GFR calc non Af Amer 4 (L) >60 mL/min   GFR calc Af Amer 4 (L) >60 mL/min   Anion gap 16 (H) 5 - 15    Comment: Performed at [facility], [address] [place], [place] [address]    Blood Alcohol level:  Lab Results  Component Value Date   ETH <10 01/20/2019   ETH <5 28/00/3491    Metabolic Disorder Labs: No results found for: HGBA1C, MPG No  results found for: PROLACTIN No results found for: CHOL, TRIG, HDL, CHOLHDL, VLDL, LDLCALC  Physical Findings: AIMS:  , ,  ,  ,    CIWA:    COWS:     Musculoskeletal: Strength & Muscle Tone: within normal limits Gait & Station: normal Patient leans: N/A  Psychiatric Specialty Exam: Physical Exam   ROS   Blood pressure (!) 146/74, pulse 95, temperature 98 F (36.7 C), temperature source Oral, resp. rate 18, height 5\' 4"  (1.626 m), weight 60.4 kg, SpO2 100 %.Body mass index is 22.86 kg/m.  General Appearance: Casual  Eye Contact:  Good  Speech:  Pressured  Volume:  Normal  Mood:  Euthymic  Affect:  Slightly agitated  Thought Process:  Coherent  Orientation:  Other:  self and place, not date and time  Thought Content:  Rumination and Tangential  Suicidal Thoughts:  No  Homicidal Thoughts:  No  Memory:  Immediate;   Poor Recent;   Fair Remote;   Fair  Judgement:  Other:  limited  Insight:  Shallow  Psychomotor Activity:  Restlessness  Concentration:  Concentration: Poor and Attention Span: Poor  Recall:  Poor  Fund of Knowledge:  Fair  Language:  Fair  Akathisia:  No  Handed:  Right  AIMS (if indicated):     Assets:  Desire for Improvement  ADL's:  Intact  Cognition:  Impaired,  Mild  Sleep:  Number of Hours: 4.5     Treatment Plan Summary: Daily contact with patient to assess and evaluate symptoms and progress in treatment   68yo F, who presents to [facility] inpatient psych unit due to exacerbation of bipolar disorder, currently manic episode in settings of recent family loss.  Patient still appears manic; her thought process is disorganized, tangential with flight of ideas. She has poor insight and judgment in her current mental condition. The only improvement today - she slept 4.5 hours last night, which is better that several previous nights. The current dose of antipsychotic is high; will continue current psych medications for now with hope that we will a get  positive effect soon.  Impression: Bipolar disorder, currently manic.  Plan: -continue inpatient psych admission; 15-minute checks; daily contact with patient to assess and evaluate symptoms and progress in treatment; psychoeducation.   -continue scheduled psych medications: Zyprexa 20mg  PO QHS, Remeron 30mg  PO QHS.  -continue PRN medications.   -Disposition: to be determined.  Larita Fife, MD 12/30/2018, 11:42 AM

## 2018-12-31 DIAGNOSIS — F312 Bipolar disorder, current episode manic severe with psychotic features: Secondary | ICD-10-CM

## 2018-12-31 MED ORDER — DIVALPROEX SODIUM 250 MG PO DR TAB
250.0000 mg | DELAYED_RELEASE_TABLET | Freq: Three times a day (TID) | ORAL | Status: DC
  Administered 2018-12-31 – 2019-01-04 (×12): 250 mg via ORAL
  Filled 2018-12-31 (×12): qty 1

## 2018-12-31 NOTE — Progress Notes (Signed)
Post HD Tx, pt stable ok to return to unit   12/31/18 1735  Hand-Off documentation  Report given to (Full Name) Jerry Caras, RN   Report received from (Full Name) Beatris Ship, RN   Vital Signs  Temp 98.6 F (37 C)  Temp Source Oral  Pulse Rate 89  Pulse Rate Source Monitor  Resp 16  BP (!) 143/82  BP Location Right Arm  BP Method Automatic  Patient Position (if appropriate) Sitting  Oxygen Therapy  SpO2 100 %  O2 Device Room Air  Pain Assessment  Pain Scale 0-10  Pain Score 0  Dialysis Weight  Weight  (unable to weigh in recliner )  Type of Weight Post-Dialysis  Post-Hemodialysis Assessment  Rinseback Volume (mL) 250 mL  KECN 63 V  Dialyzer Clearance Lightly streaked  Duration of HD Treatment -hour(s) 3.5 hour(s)  Hemodialysis Intake (mL) 500 mL  UF Total -Machine (mL) 2001 mL  Net UF (mL) 1501 mL  Tolerated HD Treatment Yes  AVG/AVF Arterial Site Held (minutes) 8 minutes  AVG/AVF Venous Site Held (minutes) 5 minutes

## 2018-12-31 NOTE — [facility] Group Notes (Signed)
Emotional Regulation 12/31/2018 1PM  Type of Therapy/Topic:  Group Therapy:  Emotion Regulation  Participation Level:  Minimal   Description of Group:   The purpose of this group is to assist patients in learning to regulate negative emotions and experience positive emotions. Patients will be guided to discuss ways in which they have been vulnerable to their negative emotions. These vulnerabilities will be juxtaposed with experiences of positive emotions or situations, and patients will be challenged to use positive emotions to combat negative ones. Special emphasis will be placed on coping with negative emotions in conflict situations, and patients will process healthy conflict resolution skills.  Therapeutic Goals: 1. Patient will identify two positive emotions or experiences to reflect on in order to balance out negative emotions 2. Patient will label two or more emotions that they find the most difficult to experience 3. Patient will demonstrate positive conflict resolution skills through discussion and/or role plays  Summary of Patient Progress:  Pt displayed disorganized thinking and had difficulty following today's topic. Pt required great deal of redirection and interjected at inappropriate times causing members to become frustrated. Pt got into verbal disagreement with a group member. [facility] was contacted and pt was escorted out of group early.     Therapeutic Modalities:   Cognitive Behavioral Therapy Feelings Identification Dialectical Behavioral Therapy   Yvette Rack, LCSW 12/31/2018 2:06 PM

## 2018-12-31 NOTE — Progress Notes (Signed)
HD Tx completed, tolerated well, UF goal met.    12/31/18 1730  Vital Signs  Pulse Rate 90  Pulse Rate Source Monitor  Resp 16  BP (!) 147/71  BP Location Right Arm  BP Method Automatic  Patient Position (if appropriate) Sitting  Oxygen Therapy  SpO2 100 %  O2 Device Room Air  During Hemodialysis Assessment  Blood Flow Rate (mL/min) 400 mL/min  Arterial Pressure (mmHg) -170 mmHg  Venous Pressure (mmHg) 220 mmHg  Transmembrane Pressure (mmHg) 50 mmHg  Ultrafiltration Rate (mL/min) 660 mL/min  Dialysate Flow Rate (mL/min) 600 ml/min  Conductivity: Machine  14  HD Safety Checks Performed Yes  KECN 63 KECN  Dialysis Fluid Bolus Normal Saline  Bolus Amount (mL) 250 mL  Intra-Hemodialysis Comments Tx completed;Tolerated well

## 2018-12-31 NOTE — Progress Notes (Signed)
Pre HD assessment    12/31/18 1315  Neurological  Level of Consciousness Alert  Orientation Level Oriented to person  Respiratory  Respiratory Pattern Regular  Chest Assessment Chest expansion symmetrical  Bilateral Breath Sounds Clear;Diminished  Cough None  Cardiac  Pulse Regular  Heart Sounds S1, S2  ECG Monitor Yes  Cardiac Rhythm NSR  Vascular  R Dorsalis Pedis Pulse +2  L Dorsalis Pedis Pulse +2  Psychosocial  Psychosocial (WDL) WDL

## 2018-12-31 NOTE — Progress Notes (Signed)
[facility]  ROUNDING NOTE   Subjective:   Seen and examined on hemodialysis. Tolerating treatment well. UF goal of 1.5 liters.   Manic with tangental thoughts and speech.     HEMODIALYSIS FLOWSHEET:  Blood Flow Rate (mL/min): 200 mL/min Arterial Pressure (mmHg): -180 mmHg Venous Pressure (mmHg): 100 mmHg Transmembrane Pressure (mmHg): 60 mmHg Ultrafiltration Rate (mL/min): 670 mL/min Dialysate Flow Rate (mL/min): 600 ml/min Conductivity: Machine : 14 Conductivity: Machine : 14 Dialysis Fluid Bolus: Normal Saline Bolus Amount (mL): 250 mL    Objective:  Vital signs in last 24 hours:  Temp:  [98.8 F (37.1 C)] 98.8 F (37.1 C) (04/08 0610) Pulse Rate:  [98] 98 (04/08 0610) Resp:  [18] 18 (04/08 0610) BP: (155)/(76) 155/76 (04/08 0610) SpO2:  [100 %] 100 % (04/08 0610)  Weight change:  Filed Weights   12/26/18 1045 12/29/18 0812 12/29/18 1220  Weight: 61.5 kg 61.8 kg 60.4 kg    Intake/Output: No intake/output data recorded.   Intake/Output this shift:  No intake/output data recorded.  Physical Exam: General: NAD, seated in a chair  Head: Normocephalic, atraumatic. Moist oral mucosal membranes  Eyes: Anicteric, PERRL  Neck: Supple, trachea midline  Lungs:  Clear to auscultation  Heart: Regular rate and rhythm  Abdomen:  Soft, nontender,   Extremities: no peripheral edema.  Neurologic: Nonfocal, moving all four extremities  Skin: No lesions  Access: Left AVG    Basic Metabolic Panel: Recent Labs  Lab 01/14/2019 2226 12/29/18 0910  NA 136 132*  K 4.0 5.6*  CL 92* 93*  CO2 30 23  GLUCOSE 89 127*  BUN 15 100*  CREATININE 4.39* 9.82*  CALCIUM 9.5 10.0  PHOS  --  3.2    Liver Function Tests: Recent Labs  Lab 01/01/2019 2226 12/29/18 0910  AST 33  --   ALT 18  --   ALKPHOS 78  --   BILITOT 0.7  --   PROT 8.3*  --   ALBUMIN 3.9 3.4*   No results for input(s): LIPASE, AMYLASE in the last 168 hours. Recent Labs  Lab 01/12/2019 2334   AMMONIA 39*    CBC: Recent Labs  Lab 01/21/2019 2226  WBC 6.2  NEUTROABS 4.0  HGB 10.9*  HCT 34.5*  MCV 94.0  PLT 218    Cardiac Enzymes: Recent Labs  Lab 12/31/2018 2226  TROPONINI <0.03    BNP: Invalid input(s): POCBNP  CBG: Recent Labs  Lab 12/29/18 0825  GLUCAP 114*    Microbiology: Results for orders placed or performed during the hospital encounter of 08/15/17  MRSA PCR Screening     Status: Abnormal   Collection Time: 08/16/17  8:28 PM  Result Value Ref Range Status   MRSA by PCR POSITIVE (A) NEGATIVE Final    Comment:        The GeneXpert MRSA Assay (FDA approved for NASAL specimens only), is one component of a comprehensive MRSA colonization surveillance program. It is not intended to diagnose MRSA infection nor to guide or monitor treatment for MRSA infections. RESULT CALLED TO, READ BACK BY AND VERIFIED WITH: Madison [name] AT 2146 08/16/17.PMH     Coagulation Studies: No results for input(s): LABPROT, INR in the last 72 hours.  Urinalysis: No results for input(s): COLORURINE, LABSPEC, PHURINE, GLUCOSEU, HGBUR, BILIRUBINUR, KETONESUR, PROTEINUR, UROBILINOGEN, NITRITE, LEUKOCYTESUR in the last 72 hours.  Invalid input(s): APPERANCEUR    Imaging: No results found.   Medications:    . Chlorhexidine Gluconate Cloth  6 each  Topical Q0600  . divalproex  250 mg Oral TID  . gabapentin  300 mg Oral QHS  . lidocaine-prilocaine   Topical Q M,W,F  . mirtazapine  30 mg Oral QHS  . multivitamin with minerals  1 tablet Oral Daily  . OLANZapine  20 mg Oral QHS  . OLANZapine  5 mg Oral Daily  . pantoprazole  40 mg Oral Daily  . temazepam  15 mg Oral QHS  . thiamine  50 mg Oral Daily   acetaminophen, alum & mag hydroxide-simeth, senna  Assessment/ Plan:  [name] is a 68 y.o. black female with end-stage renal disease, bipolar disorder, hypertension was admitted on 12/25/2018 with manic symptoms.   MWF [facility]  [place]   1.  End-stage renal disease: seen and examined on hemodialysis treatment. Tolerating treatment well.  - Continue MWF schedule  2.  Anemia of chronic kidney disease: hemoglobin 10.9. Mircera as outpatient - EPO with HD treatments.   3.  Secondary hyperparathyroidism. - Currently off her Auryxia  4. Hypertension: 143/81. Not currently on blood pressure agents.    LOS: 6 Madison [name] 4/8/20203:22 PM

## 2018-12-31 NOTE — Plan of Care (Addendum)
Patient is alert to self and disoriented to time, situation and place. Patient rapidly talking to self with flight of ideas and very manic. Denies SI, HI and AVH. Patient stands at the nurses station and talks rapidly jumping from one topic to the other. Patient is pleasant but very preoccupied with medications, times and place. Patient's sister called to make sure patient is taking medications for constipation, RN did assess patient for constipation, patient reports, " I am not constipated today I went, but when I first got here I was the most constipated I have ever been and I told them about it, but I don't need anything right now." RN will pass along information to oncoming shift to give PRN senokot at night time .  Patient is pleasantly confused with pressured speech and tangential thinking, Safety checks Q 15 minutes to continue.

## 2018-12-31 NOTE — Progress Notes (Signed)
[facility] MD Progress Note  12/31/2018 10:49 AM Madison [name]  MRN:  [id number]  68yo F, who presents to [facility] inpatient psych unit due to exacerbation of bipolar disorder, currently manic episode in settings of recent family loss.  Patient seen, chart reviewed, case discussed with treatment team.  Subjective: Patient reports it is her birthday today (which is true). She still presents with manic symptoms without visible improvement - hyperactive, hyperverbal, difficult to redirect, often seen at a nursing station, cannot participate in meaningful perticipation. She is fixated on discharge and her need for dialysis. She reports not feeling depressed anymore and feeling safe for discharge. She demonstrates no insight in her current mania.   Principal Problem: Bipolar affective disorder, current episode manic with psychotic symptoms ([place]) Diagnosis: Principal Problem:   Bipolar affective disorder, current episode manic with psychotic symptoms ([place]) Active Problems:   End stage renal disease ([place])  Total Time spent with patient: 20 minutes  Past Psychiatric History: see H&P  Past Medical History:  Past Medical History:  Diagnosis Date  . Anxiety   . ESRD (end stage renal disease) on dialysis ([place])   . Neuropathy   . Schizophrenia [facility])     Past Surgical History:  Procedure Laterality Date  . CHOLECYSTECTOMY    . COLONOSCOPY N/A 01/20/2016   Dr. Oneida Alar: 12 mm tubular adenoma removed from the transverse colon, 4 hyperplastic polyps removed from the rectum and sigmoid colon.  Next colonoscopy planned for April 2020.  Marland Kitchen ESOPHAGOGASTRODUODENOSCOPY N/A 12/19/2017   Procedure: ESOPHAGOGASTRODUODENOSCOPY (EGD);  Surgeon: [name], MD;  Location: [facility] ENDO SUITE;  Service: Endoscopy;  Laterality: N/A;  8:30am  . fistula left arm    . GIVENS CAPSULE STUDY N/A 01/14/2018   Procedure: GIVENS CAPSULE STUDY;  Surgeon: [name], MD;  Location: [facility] ENDO SUITE;  Service: Endoscopy;  Laterality:  N/A;  7:30am  . HEMICOLECTOMY Right   . TUBAL LIGATION     Family History:  Family History  Problem Relation Age of Onset  . Alzheimer's disease Mother   . Cancer Mother        pancreatic  . Prostate cancer Father   . Kidney failure Sister   . Breast cancer Neg Hx   . Colon cancer Neg Hx    Family Psychiatric  History: see H&P Social History:  Social History   Substance and Sexual Activity  Alcohol Use No     Social History   Substance and Sexual Activity  Drug Use No    Social History   Socioeconomic History  . Marital status: Single    Spouse name: Not on file  . Number of children: Not on file  . Years of education: Not on file  . Highest education level: Not on file  Occupational History  . Not on file  Social Needs  . Financial resource strain: Not on file  . Food insecurity:    Worry: Not on file    Inability: Not on file  . Transportation needs:    Medical: Not on file    Non-medical: Not on file  Tobacco Use  . Smoking status: Never Smoker  . Smokeless tobacco: Never Used  Substance and Sexual Activity  . Alcohol use: No  . Drug use: No  . Sexual activity: Yes  Lifestyle  . Physical activity:    Days per week: Not on file    Minutes per session: Not on file  . Stress: Not on file  Relationships  .  Social connections:    Talks on phone: Not on file    Gets together: Not on file    Attends religious service: Not on file    Active member of club or organization: Not on file    Attends meetings of clubs or organizations: Not on file    Relationship status: Not on file  Other Topics Concern  . Not on file  Social History Narrative  . Not on file   Additional Social History:                         Sleep: Fair  Appetite:  Fair  Current Medications: Current Facility-Administered Medications  Medication Dose Route Frequency Provider Last Rate Last Dose  . acetaminophen (TYLENOL) tablet 650 mg  650 mg Oral Q6H PRN Clapacs, John  T, MD      . alum & mag hydroxide-simeth (MAALOX/MYLANTA) 200-200-20 MG/5ML suspension 30 mL  30 mL Oral Q4H PRN Lamont Dowdy, NP      . Chlorhexidine Gluconate Cloth 2 % PADS 6 each  6 each Topical Q0600 Murlean Iba, MD      . divalproex (DEPAKOTE) DR tablet 250 mg  250 mg Oral TID Larita Fife, MD      . gabapentin (NEURONTIN) capsule 300 mg  300 mg Oral QHS Clapacs, John T, MD   300 mg at 12/30/18 2121  . lidocaine-prilocaine (EMLA) cream   Topical Q M,W,F Singh, Harmeet, MD      . mirtazapine (REMERON) tablet 30 mg  30 mg Oral QHS Clapacs, Madie Reno, MD   30 mg at 12/30/18 2121  . multivitamin with minerals tablet 1 tablet  1 tablet Oral Daily Clapacs, Madie Reno, MD   1 tablet at 12/31/18 [phone]  . OLANZapine (ZYPREXA) tablet 20 mg  20 mg Oral QHS Clapacs, Madie Reno, MD   20 mg at 12/30/18 2122  . OLANZapine (ZYPREXA) tablet 5 mg  5 mg Oral Daily Clapacs, Madie Reno, MD   5 mg at 12/31/18 0807  . pantoprazole (PROTONIX) EC tablet 40 mg  40 mg Oral Daily Clapacs, Madie Reno, MD   40 mg at 12/31/18 0808  . senna (SENOKOT) tablet 8.6-17.2 mg  1-2 tablet Oral QHS PRN Lamont Dowdy, NP   17.2 mg at 12/29/18 2226  . temazepam (RESTORIL) capsule 15 mg  15 mg Oral QHS Clapacs, Madie Reno, MD   15 mg at 12/30/18 2123  . thiamine (VITAMIN B-1) tablet 50 mg  50 mg Oral Daily Clapacs, Madie Reno, MD   50 mg at 12/31/18 0923    Lab Results:  No results found for this or any previous visit (from the past 48 hour(s)).  Blood Alcohol level:  Lab Results  Component Value Date   ETH <10 01/11/2019   ETH <5 30/03/6225    Metabolic Disorder Labs: No results found for: HGBA1C, MPG No results found for: PROLACTIN No results found for: CHOL, TRIG, HDL, CHOLHDL, VLDL, LDLCALC  Physical Findings: AIMS:  , ,  ,  ,    CIWA:    COWS:     Musculoskeletal: Strength & Muscle Tone: within normal limits Gait & Station: normal Patient leans: N/A  Psychiatric Specialty Exam: Physical Exam   ROS   Blood  pressure (!) 155/76, pulse 98, temperature 98.8 F (37.1 C), temperature source Oral, resp. rate 18, height 5\' 4"  (1.626 m), weight 60.4 kg, SpO2 100 %.Body mass index is 22.86 kg/m.  General Appearance: Casual  Eye Contact:  Good  Speech:  Pressured  Volume:  Normal  Mood:  Euthymic  Affect:  Slightly agitated  Thought Process:  Coherent  Orientation:  Other:  self and place, not date and time  Thought Content:  Rumination and Tangential  Suicidal Thoughts:  No  Homicidal Thoughts:  No  Memory:  Immediate;   Poor Recent;   Fair Remote;   Fair  Judgement:  Other:  limited  Insight:  Shallow  Psychomotor Activity:  Restlessness  Concentration:  Concentration: Poor and Attention Span: Poor  Recall:  Poor  Fund of Knowledge:  Fair  Language:  Fair  Akathisia:  No  Handed:  Right  AIMS (if indicated):     Assets:  Desire for Improvement  ADL's:  Intact  Cognition:  Impaired,  Mild  Sleep:  Number of Hours: 5.75     Treatment Plan Summary: Daily contact with patient to assess and evaluate symptoms and progress in treatment   67yo F, who presents to [facility] inpatient psych unit due to exacerbation of bipolar disorder, currently manic episode in settings of recent family loss.  Patient still appears manic; her thought process is disorganized, tangential with flight of ideas. She has no insight and poor judgment regarding her current mental condition. The only improvement today - she slept 5.7 hours last night, which is better that several previous nights. The current dose of antipsychotic is high, the patient demonstrates no improvement in her manic symptoms for several days; will add low dose of Depakote TID for mania.   Impression: Bipolar disorder, currently manic.  Plan: -continue inpatient psych admission; 15-minute checks; daily contact with patient to assess and evaluate symptoms and progress in treatment; psychoeducation.   -continue scheduled psych medications: Zyprexa  20mg  PO QHS and 5mg  PO daily, Remeron 30mg  PO QHS.  -add Depakote 250mg  PO TID for acute mania.  -continue PRN medications.   -Disposition: to be determined. Patient is not ready for discharge at the moment.  Larita Fife, MD 12/31/2018, 10:49 AM

## 2018-12-31 NOTE — Progress Notes (Signed)
Post HD assessment    12/31/18 1740  Neurological  Level of Consciousness Alert  Orientation Level Oriented to person  Respiratory  Respiratory Pattern Regular  Chest Assessment Chest expansion symmetrical  Bilateral Breath Sounds Clear  Cough None  Cardiac  Pulse Regular  Heart Sounds S1, S2  ECG Monitor Yes  Cardiac Rhythm NSR  Vascular  R Dorsalis Pedis Pulse +2  L Dorsalis Pedis Pulse +2  Psychosocial  Psychosocial (WDL) WDL

## 2018-12-31 NOTE — Progress Notes (Signed)
Pre HD Tx   12/31/18 1320  Vital Signs  Temp 98.6 F (37 C)  Temp Source Oral  Pulse Rate 83  Pulse Rate Source Monitor  Resp 20  BP (!) 143/76  BP Location Right Arm  BP Method Automatic  Patient Position (if appropriate) Sitting  Oxygen Therapy  SpO2 100 %  O2 Device Room Air  Pain Assessment  Pain Scale 0-10  Pain Score 0  Dialysis Weight  Weight  (unable to weigh pt in recliner )  Time-Out for Hemodialysis  What Procedure? HD  Pt Identifiers(min of two) First/Last Name;MRN/Account#  Correct Site? Yes  Correct Side? Yes  Correct Procedure? Yes  Consents Verified? Yes  Rad Studies Available? N/A  Safety Precautions Reviewed? Yes  Engineer, civil (consulting) Number 7  Station Number 2  UF/Alarm Test Passed  Conductivity: Meter 14  Conductivity: Machine  14  pH 7.4  Reverse Osmosis main  Normal Saline Lot Number [id number]  Dialyzer Lot Number [id number]  Disposable Set Lot Number [id number]  Machine Temperature 98.6 F (37 C)  Musician and Audible Yes  Blood Lines Intact and Secured Yes  Pre Treatment Patient Checks  Vascular access used during treatment Fistula  Patient is receiving dialysis in a chair Yes  Hepatitis B Surface Antigen Results Negative  Date Hepatitis B Surface Antigen Drawn 10/13/18  Hepatitis B Surface Antibody  (<10)  Date Hepatitis B Surface Antibody Drawn 10/18/18  Hemodialysis Consent Verified Yes  Hemodialysis Standing Orders Initiated Yes  ECG (Telemetry) Monitor On Yes  Prime Ordered Normal Saline  Length of  DialysisTreatment -hour(s) 3.5 Hour(s)  Dialysis Treatment Comments  (Na 140)  Dialyzer Elisio 17H NR  Dialysate 3K, 2.5 Ca  Dialysis Anticoagulant None  Dialysate Flow Ordered 600  Blood Flow Rate Ordered 400 mL/min  Ultrafiltration Goal 1.5 Liters  Dialysis Blood Pressure Support Ordered Normal Saline  Education / Care Plan  Dialysis Education Provided Yes  Documented Education in Care Plan Yes

## 2018-12-31 NOTE — Progress Notes (Signed)
HD Tx started w/o complication.    12/31/18 1330  Vital Signs  Pulse Rate 83  Pulse Rate Source Monitor  Resp 16  BP (!) 148/78  BP Location Right Arm  BP Method Automatic  Patient Position (if appropriate) Sitting  Oxygen Therapy  SpO2 100 %  O2 Device Room Air  During Hemodialysis Assessment  Blood Flow Rate (mL/min) 400 mL/min  Arterial Pressure (mmHg) -170 mmHg  Venous Pressure (mmHg) 170 mmHg  Transmembrane Pressure (mmHg) 60 mmHg  Ultrafiltration Rate (mL/min) 660 mL/min  Dialysate Flow Rate (mL/min) 600 ml/min  Conductivity: Machine  14.2  HD Safety Checks Performed Yes  Dialysis Fluid Bolus Normal Saline  Bolus Amount (mL) 250 mL  Intra-Hemodialysis Comments Tx initiated

## 2018-12-31 NOTE — Tx Team (Signed)
Interdisciplinary Treatment and Diagnostic Plan Update  12/31/2018 Time of Session: 830AM KEMPER HOCHMAN MRN: [id number]  Principal Diagnosis: Bipolar affective disorder, current episode manic with psychotic symptoms ([place])  Secondary Diagnoses: Principal Problem:   Bipolar affective disorder, current episode manic with psychotic symptoms ([place]) Active Problems:   End stage renal disease ([place])   Current Medications:  Current Facility-Administered Medications  Medication Dose Route Frequency Provider Last Rate Last Dose  . acetaminophen (TYLENOL) tablet 650 mg  650 mg Oral Q6H PRN Clapacs, John T, MD      . alum & mag hydroxide-simeth (MAALOX/MYLANTA) 200-200-20 MG/5ML suspension 30 mL  30 mL Oral Q4H PRN Lamont Dowdy, NP      . Chlorhexidine Gluconate Cloth 2 % PADS 6 each  6 each Topical Q0600 Murlean Iba, MD      . divalproex (DEPAKOTE) DR tablet 250 mg  250 mg Oral TID Larita Fife, MD      . gabapentin (NEURONTIN) capsule 300 mg  300 mg Oral QHS Clapacs, John T, MD   300 mg at 12/30/18 2121  . lidocaine-prilocaine (EMLA) cream   Topical Q M,W,F Singh, Harmeet, MD      . mirtazapine (REMERON) tablet 30 mg  30 mg Oral QHS Clapacs, Madie Reno, MD   30 mg at 12/30/18 2121  . multivitamin with minerals tablet 1 tablet  1 tablet Oral Daily Clapacs, Madie Reno, MD   1 tablet at 12/31/18 [phone]  . OLANZapine (ZYPREXA) tablet 20 mg  20 mg Oral QHS Clapacs, Madie Reno, MD   20 mg at 12/30/18 2122  . OLANZapine (ZYPREXA) tablet 5 mg  5 mg Oral Daily Clapacs, Madie Reno, MD   5 mg at 12/31/18 0807  . pantoprazole (PROTONIX) EC tablet 40 mg  40 mg Oral Daily Clapacs, Madie Reno, MD   40 mg at 12/31/18 0808  . senna (SENOKOT) tablet 8.6-17.2 mg  1-2 tablet Oral QHS PRN Lamont Dowdy, NP   17.2 mg at 12/29/18 2226  . temazepam (RESTORIL) capsule 15 mg  15 mg Oral QHS Clapacs, Madie Reno, MD   15 mg at 12/30/18 2123  . thiamine (VITAMIN B-1) tablet 50 mg  50 mg Oral Daily Clapacs, Madie Reno, MD   50 mg at  12/31/18 0807   PTA Medications: Medications Prior to Admission  Medication Sig Dispense Refill Last Dose  . ferric citrate (AURYXIA) 1 GM 210 MG(Fe) tablet Take 210 mg by mouth 3 (three) times daily with meals.    Taking  . gabapentin (NEURONTIN) 300 MG capsule Take 300 mg by mouth at bedtime.    Taking  . metroNIDAZOLE (FLAGYL) 500 MG tablet Take 1 tablet (500 mg total) by mouth 2 (two) times daily. 14 tablet 1   . mirtazapine (REMERON) 30 MG tablet Take 30 mg by mouth at bedtime.    Taking  . Multiple Vitamin (MULTIVITAMIN) tablet Take 1 tablet by mouth daily.   Taking  . OLANZapine (ZYPREXA) 15 MG tablet Take 15 mg by mouth at bedtime.    Taking  . pantoprazole (PROTONIX) 40 MG tablet TAKE 1 TABLET BY MOUTH TWICE DAILY 30 MINUTES BEFORE MEALS FOR 3 MONTHS THEN DECREASE TO 1 TABLET DAILY. 56 tablet 3 Taking  . thiamine (VITAMIN B-1) 100 MG tablet Take 50 mg by mouth daily.    Taking    Patient Stressors: Health problems Medication change or noncompliance  Patient Strengths: Active sense of humor Supportive family/friends  Treatment Modalities: Medication Management, Group therapy, Case management,  1 to 1 session with clinician, Psychoeducation, Recreational therapy.   Physician Treatment Plan for Primary Diagnosis: Bipolar affective disorder, current episode manic with psychotic symptoms ([place]) Long Term Goal(s): Improvement in symptoms so as ready for discharge Improvement in symptoms so as ready for discharge   Short Term Goals: Ability to verbalize feelings will improve Ability to demonstrate self-control will improve Ability to identify and develop effective coping behaviors will improve Ability to maintain clinical measurements within normal limits will improve Compliance with prescribed medications will improve  Medication Management: Evaluate patient's response, side effects, and tolerance of medication regimen.  Therapeutic Interventions: 1 to 1 sessions, Unit Group  sessions and Medication administration.  Evaluation of Outcomes: Not Met  Physician Treatment Plan for Secondary Diagnosis: Principal Problem:   Bipolar affective disorder, current episode manic with psychotic symptoms ([place]) Active Problems:   End stage renal disease ([place])  Long Term Goal(s): Improvement in symptoms so as ready for discharge Improvement in symptoms so as ready for discharge   Short Term Goals: Ability to verbalize feelings will improve Ability to demonstrate self-control will improve Ability to identify and develop effective coping behaviors will improve Ability to maintain clinical measurements within normal limits will improve Compliance with prescribed medications will improve     Medication Management: Evaluate patient's response, side effects, and tolerance of medication regimen.  Therapeutic Interventions: 1 to 1 sessions, Unit Group sessions and Medication administration.  Evaluation of Outcomes: Not Met   RN Treatment Plan for Primary Diagnosis: Bipolar affective disorder, current episode manic with psychotic symptoms ([place]) Long Term Goal(s): Knowledge of disease and therapeutic regimen to maintain health will improve  Short Term Goals: Ability to verbalize frustration and anger appropriately will improve and Ability to participate in decision making will improve  Medication Management: RN will administer medications as ordered by provider, will assess and evaluate patient's response and provide education to patient for prescribed medication. RN will report any adverse and/or side effects to prescribing provider.  Therapeutic Interventions: 1 on 1 counseling sessions, Psychoeducation, Medication administration, Evaluate responses to treatment, Monitor vital signs and CBGs as ordered, Perform/monitor CIWA, COWS, AIMS and Fall Risk screenings as ordered, Perform wound care treatments as ordered.  Evaluation of Outcomes: Not Met   LCSW Treatment Plan for  Primary Diagnosis: Bipolar affective disorder, current episode manic with psychotic symptoms ([place]) Long Term Goal(s): Safe transition to appropriate next level of care at discharge, Engage patient in therapeutic group addressing interpersonal concerns.  Short Term Goals: Engage patient in aftercare planning with referrals and resources, Facilitate acceptance of mental health diagnosis and concerns, Identify triggers associated with mental health/substance abuse issues and Increase skills for wellness and recovery  Therapeutic Interventions: Assess for all discharge needs, 1 to 1 time with Social worker, Explore available resources and support systems, Assess for adequacy in community support network, Educate family and significant other(s) on suicide prevention, Complete Psychosocial Assessment, Interpersonal group therapy.  Evaluation of Outcomes: Not Met   Progress in Treatment: Attending groups: Yes. Participating in groups: Yes. Taking medication as prescribed: Yes. Toleration medication: Yes. Family/Significant other contact made: Yes, individual(s) contacted:  Pts cousin Patient understands diagnosis: Yes. Discussing patient identified problems/goals with staff: Yes. Medical problems stabilized or resolved: Yes. Denies suicidal/homicidal ideation: Yes. Issues/concerns per patient self-inventory: No. Other: N/A  New problem(s) identified: No, Describe:  none  New Short Term/Long Term Goal(s): medication management for mood stabilization;  development of comprehensive mental wellness/sobriety plan.   Patient Goals:  "going home"  Discharge Plan or Barriers: SPE pamphlet, Mobile Crisis information, and AA/NA information provided to patient for additional community support and resources. Pt has an appointment with her psychiatrist Dr. Darleene Cleaver on 01/06/2019 at 9:00 AM.  Reason for Continuation of Hospitalization: Medication stabilization  Estimated Length of Stay: 5-7  days  Recreational Therapy: Patient Stressors: N/A Patient Goal: Patient will focus on task/topic with 2 prompts from staff within 5 recreation therapy group sessions  Attendees: Patient: Latha Staunton 12/31/2018 10:52 AM  Physician: Dr Danella Sensing MD 12/31/2018 10:52 AM  Nursing:  12/31/2018 10:52 AM  RN Care Manager: 12/31/2018 10:52 AM  Social Worker: Minette Brine Edit Ricciardelli LCSW 12/31/2018 10:52 AM  Recreational Therapist:  12/31/2018 10:52 AM  Other:  12/31/2018 10:52 AM  Other:  12/31/2018 10:52 AM  Other: 12/31/2018 10:52 AM    Scribe for Treatment Team: [place], LCSW 12/31/2018 10:52 AM

## 2018-12-31 NOTE — Progress Notes (Signed)
Recreation Therapy Notes    Date: 12/31/2018  Time: 9:30 am  Location: Craft Room  Behavioral response: Disruptive, Hyperverbal   Intervention Topic: Happiness  Discussion/Intervention:  Group content today was focused on Happiness. The group defined happiness and described where happiness comes from. Individuals identified what makes them happy and how they go about making others happy. Patients expressed things that stop them from being happy and ways they can improve their happiness. The group stated reasons why it is important to be happy. The group participated in the intervention "My Happiness", where they had a chance to identify and express things that make them happy. Clinical Observations/Feedback:  Patient came to group late and was loud and disruptive.Participant needed much redirection in group to stay on topic. She could not remain quiet and allow other peers to talk. Individual was asked to leave group due to being disruptive in group and disturbing her peers.  Ngoc Detjen LRT/CTRS         Marilu Rylander 12/31/2018 10:27 AM

## 2019-01-01 DIAGNOSIS — F312 Bipolar disorder, current episode manic severe with psychotic features: Secondary | ICD-10-CM | POA: Diagnosis not present

## 2019-01-01 LAB — CBC
HCT: 29.4 % — ABNORMAL LOW (ref 36.0–46.0)
Hemoglobin: 9.4 g/dL — ABNORMAL LOW (ref 12.0–15.0)
MCH: 30.4 pg (ref 26.0–34.0)
MCHC: 32 g/dL (ref 30.0–36.0)
MCV: 95.1 fL (ref 80.0–100.0)
Platelets: 189 10*3/uL (ref 150–400)
RBC: 3.09 MIL/uL — ABNORMAL LOW (ref 3.87–5.11)
RDW: 18.3 % — ABNORMAL HIGH (ref 11.5–15.5)
WBC: 4.9 10*3/uL (ref 4.0–10.5)
nRBC: 0 % (ref 0.0–0.2)

## 2019-01-01 LAB — RENAL FUNCTION PANEL
Albumin: 3.3 g/dL — ABNORMAL LOW (ref 3.5–5.0)
Anion gap: 12 (ref 5–15)
BUN: 48 mg/dL — ABNORMAL HIGH (ref 8–23)
CO2: 29 mmol/L (ref 22–32)
Calcium: 9.4 mg/dL (ref 8.9–10.3)
Chloride: 98 mmol/L (ref 98–111)
Creatinine, Ser: 5.58 mg/dL — ABNORMAL HIGH (ref 0.44–1.00)
GFR calc Af Amer: 8 mL/min — ABNORMAL LOW (ref >60)
GFR calc non Af Amer: 7 mL/min — ABNORMAL LOW (ref >60)
Glucose, Bld: 86 mg/dL (ref 70–99)
Phosphorus: 3.4 mg/dL (ref 2.5–4.6)
Potassium: 5.7 mmol/L — ABNORMAL HIGH (ref 3.5–5.1)
Sodium: 139 mmol/L (ref 135–145)

## 2019-01-01 MED ORDER — LITHIUM CARBONATE 300 MG PO CAPS
300.0000 mg | ORAL_CAPSULE | ORAL | Status: AC
  Administered 2019-01-01: 300 mg via ORAL
  Filled 2019-01-01: qty 1

## 2019-01-01 MED ORDER — LITHIUM CARBONATE 300 MG PO CAPS
300.0000 mg | ORAL_CAPSULE | ORAL | Status: DC
  Administered 2019-01-02: 300 mg via ORAL
  Filled 2019-01-01: qty 1

## 2019-01-01 NOTE — Progress Notes (Signed)
Recreation Therapy Notes  Date: 01/01/2019  Time: 9:30 am   Location: Craft room   Behavioral response: N/A   Intervention Topic: Creative Expressions  Discussion/Intervention: Patient did not attend group.   Clinical Observations/Feedback:  Patient did not attend group.   Rogelio Waynick LRT/CTRS         [place] 01/01/2019 11:11 AM

## 2019-01-01 NOTE — Plan of Care (Signed)
Patient is still confused and disoriented due to disease process, talks a lot  with out stopping, pace the floor and easily redirectable to safe. Patient takes her medications without any side effects, eat adequately and attends dialysis MWF, patient stable and room is free of clutters, sleep is continuous with out interruptions only requiring 15 minutes safety checks, denies any SI/HI AVH. No distress.   Problem: Education: Goal: Knowledge of [place] [facility] Education information/materials will improve Outcome: Progressing   Problem: Coping: Goal: Ability to verbalize frustrations and anger appropriately will improve Outcome: Progressing Goal: Ability to demonstrate self-control will improve Outcome: Progressing   Problem: Safety: Goal: Periods of time without injury will increase Outcome: Progressing   Problem: Health Behavior/Discharge Planning: Goal: Compliance with prescribed medication regimen will improve Outcome: Progressing   Problem: Self-Care: Goal: Ability to participate in self-care as condition permits will improve Outcome: Progressing   Problem: Education: Goal: Ability to state activities that reduce stress will improve Outcome: Progressing   Problem: Coping: Goal: Ability to identify and develop effective coping behavior will improve Outcome: Progressing   Problem: Self-Concept: Goal: Ability to identify factors that promote anxiety will improve Outcome: Progressing Goal: Level of anxiety will decrease Outcome: Progressing Goal: Ability to modify response to factors that promote anxiety will improve Outcome: Progressing

## 2019-01-01 NOTE — Plan of Care (Signed)
Patient is alert to self, disoriented to situation and time. Patient continues to have rapid pressured speech. When talking patient continues to have flight of ideas, jumps from topic to topic. Patient expressed concern for her care now because her mother has passed, patient feels her sisters will continue to place her in mental facilities. Patient is compliant with medications, confusion moderate, denies SI, HI and AVH. Patient is intrusive, stands in front of nursing station and talks to the windows even after people have finished talking with her. Patient received scrubs, and shower supplies, but refuses to shower and wash her clothing. Patient states, "I am not using that machine I have a perfectly good machine at home." RN will continue to assess. Problem: Education: Goal: Knowledge of [place] Education information/materials will improve Outcome: Not Progressing   Problem: Coping: Goal: Ability to verbalize frustrations and anger appropriately will improve Outcome: Not Progressing Goal: Ability to demonstrate self-control will improve Outcome: Not Progressing   Problem: Safety: Goal: Periods of time without injury will increase Outcome: Not Progressing   Problem: Health Behavior/Discharge Planning: Goal: Compliance with prescribed medication regimen will improve Outcome: Not Progressing

## 2019-01-01 NOTE — Progress Notes (Signed)
[facility] MD Progress Note  01/01/2019 6:18 PM Madison [name]  MRN:  [id number] Subjective: Follow-up for this patient with bipolar disorder who remains hyperverbal agitated manic disorganized despite antipsychotic medicine Principal Problem: Bipolar affective disorder, current episode manic with psychotic symptoms ([place]) Diagnosis: Principal Problem:   Bipolar affective disorder, current episode manic with psychotic symptoms ([place]) Active Problems:   End stage renal disease ([place])  Total Time spent with patient: 30 minutes  Past Psychiatric History: Past history of chronic bipolar disorder  Past Medical History:  Past Medical History:  Diagnosis Date  . Anxiety   . ESRD (end stage renal disease) on dialysis ([place])   . Neuropathy   . Schizophrenia [facility])     Past Surgical History:  Procedure Laterality Date  . CHOLECYSTECTOMY    . COLONOSCOPY N/A 01/20/2016   Dr. Oneida Alar: 12 mm tubular adenoma removed from the transverse colon, 4 hyperplastic polyps removed from the rectum and sigmoid colon.  Next colonoscopy planned for April 2020.  Marland Kitchen ESOPHAGOGASTRODUODENOSCOPY N/A 12/19/2017   Procedure: ESOPHAGOGASTRODUODENOSCOPY (EGD);  Surgeon: [name], MD;  [facility];  Service: Endoscopy;  Laterality: N/A;  8:30am  . fistula left arm    . GIVENS CAPSULE STUDY N/A 01/14/2018   Procedure: GIVENS CAPSULE STUDY;  Surgeon: [name], MD;  Location: [facility];  Service: Endoscopy;  Laterality: N/A;  7:30am  . HEMICOLECTOMY Right   . TUBAL LIGATION     Family History:  Family History  Problem Relation Age of Onset  . Alzheimer's disease Mother   . Cancer Mother        pancreatic  . Prostate cancer Father   . Kidney failure Sister   . Breast cancer Neg Hx   . Colon cancer Neg Hx    Family Psychiatric  History: Twin sister with the same Social History:  Social History   Substance and Sexual Activity  Alcohol Use No     Social History   Substance and Sexual Activity   Drug Use No    Social History   Socioeconomic History  . Marital status: Single    Spouse name: Not on file  . Number of children: Not on file  . Years of education: Not on file  . Highest education level: Not on file  Occupational History  . Not on file  Social Needs  . Financial resource strain: Not on file  . Food insecurity:    Worry: Not on file    Inability: Not on file  . Transportation needs:    Medical: Not on file    Non-medical: Not on file  Tobacco Use  . Smoking status: Never Smoker  . Smokeless tobacco: Never Used  Substance and Sexual Activity  . Alcohol use: No  . Drug use: No  . Sexual activity: Yes  Lifestyle  . Physical activity:    Days per week: Not on file    Minutes per session: Not on file  . Stress: Not on file  Relationships  . Social connections:    Talks on phone: Not on file    Gets together: Not on file    Attends religious service: Not on file    Active member of club or organization: Not on file    Attends meetings of clubs or organizations: Not on file    Relationship status: Not on file  Other Topics Concern  . Not on file  Social History Narrative  . Not on file  Additional Social History:                         Sleep: Fair  Appetite:  Fair  Current Medications: Current Facility-Administered Medications  Medication Dose Route Frequency Provider Last Rate Last Dose  . acetaminophen (TYLENOL) tablet 650 mg  650 mg Oral Q6H PRN Clapacs, John T, MD      . alum & mag hydroxide-simeth (MAALOX/MYLANTA) 200-200-20 MG/5ML suspension 30 mL  30 mL Oral Q4H PRN Lamont Dowdy, NP      . Chlorhexidine Gluconate Cloth 2 % PADS 6 each  6 each Topical Q0600 Murlean Iba, MD      . divalproex (DEPAKOTE) DR tablet 250 mg  250 mg Oral TID Larita Fife, MD   250 mg at 01/01/19 1647  . gabapentin (NEURONTIN) capsule 300 mg  300 mg Oral QHS Clapacs, Madie Reno, MD   300 mg at 12/31/18 2139  . lidocaine-prilocaine (EMLA) cream    Topical Q M,W,F Murlean Iba, MD      . Derrill Memo ON 01/02/2019] lithium carbonate capsule 300 mg  300 mg Oral Once per day on Mon Wed Fri Clapacs, John T, MD      . mirtazapine (REMERON) tablet 30 mg  30 mg Oral QHS Clapacs, Madie Reno, MD   30 mg at 12/31/18 2139  . multivitamin with minerals tablet 1 tablet  1 tablet Oral Daily Clapacs, Madie Reno, MD   1 tablet at 01/01/19 [id number]  . OLANZapine (ZYPREXA) tablet 20 mg  20 mg Oral QHS Clapacs, Madie Reno, MD   20 mg at 12/31/18 2139  . OLANZapine (ZYPREXA) tablet 5 mg  5 mg Oral Daily Clapacs, Madie Reno, MD   5 mg at 01/01/19 0827  . pantoprazole (PROTONIX) EC tablet 40 mg  40 mg Oral Daily Clapacs, Madie Reno, MD   40 mg at 01/01/19 0827  . senna (SENOKOT) tablet 8.6-17.2 mg  1-2 tablet Oral QHS PRN Lamont Dowdy, NP   17.2 mg at 12/29/18 2226  . temazepam (RESTORIL) capsule 15 mg  15 mg Oral QHS Clapacs, Madie Reno, MD   15 mg at 12/31/18 2138  . thiamine (VITAMIN B-1) tablet 50 mg  50 mg Oral Daily Clapacs, Madie Reno, MD   50 mg at 01/01/19 7026    Lab Results:  Results for orders placed or performed during the hospital encounter of 12/25/18 (from the past 48 hour(s))  Renal function panel     Status: Abnormal   Collection Time: 01/01/19  6:53 AM  Result Value Ref Range   Sodium 139 135 - 145 mmol/L   Potassium 5.7 (H) 3.5 - 5.1 mmol/L   Chloride 98 98 - 111 mmol/L   CO2 29 22 - 32 mmol/L   Glucose, Bld 86 70 - 99 mg/dL   BUN 48 (H) 8 - 23 mg/dL   Creatinine, Ser 5.58 (H) 0.44 - 1.00 mg/dL   Calcium 9.4 8.9 - 10.3 mg/dL   Phosphorus 3.4 2.5 - 4.6 mg/dL   Albumin 3.3 (L) 3.5 - 5.0 g/dL   GFR calc non Af Amer 7 (L) >60 mL/min   GFR calc Af Amer 8 (L) >60 mL/min   Anion gap 12 5 - 15    Comment: Performed at [facility], [address] [place], [place] [address]  CBC     Status: Abnormal   Collection Time: 01/01/19  6:53 AM  Result Value Ref Range   WBC 4.9 4.0 -  10.5 K/uL   RBC 3.09 (L) 3.87 - 5.11 MIL/uL   Hemoglobin 9.4 (L) 12.0 - 15.0  g/dL   HCT 29.4 (L) 36.0 - 46.0 %   MCV 95.1 80.0 - 100.0 fL   MCH 30.4 26.0 - 34.0 pg   MCHC 32.0 30.0 - 36.0 g/dL   RDW 18.3 (H) 11.5 - 15.5 %   Platelets 189 150 - 400 K/uL   nRBC 0.0 0.0 - 0.2 %    Comment: Performed at [facility], [address], [place],  [address]    Blood Alcohol level:  Lab Results  Component Value Date   [facility] <10 12/29/2018   ETH <5 89/38/1017    Metabolic Disorder Labs: No results found for: HGBA1C, MPG No results found for: PROLACTIN No results found for: CHOL, TRIG, HDL, CHOLHDL, VLDL, LDLCALC  Physical Findings: AIMS:  , ,  ,  ,    CIWA:    COWS:     Musculoskeletal: Strength & Muscle Tone: within normal limits Gait & Station: normal Patient leans: N/A  Psychiatric Specialty Exam: Physical Exam  Nursing note and vitals reviewed. Constitutional: She appears well-developed and well-nourished.  HENT:  Head: Normocephalic and atraumatic.  Eyes: Pupils are equal, round, and reactive to light. Conjunctivae are normal.  Neck: Normal range of motion.  Cardiovascular: Regular rhythm and normal heart sounds.  Respiratory: Effort normal.  GI: Soft.  Musculoskeletal: Normal range of motion.  Neurological: She is alert.  Skin: Skin is warm and dry.  Psychiatric: Her affect is labile and inappropriate. Her speech is tangential. She is agitated. Thought content is paranoid. Cognition and memory are impaired. She expresses inappropriate judgment.    Review of Systems  Constitutional: Negative.   HENT: Negative.   Eyes: Negative.   Respiratory: Negative.   Cardiovascular: Negative.   Gastrointestinal: Negative.   Musculoskeletal: Negative.   Skin: Negative.   Neurological: Negative.   Psychiatric/Behavioral: The patient is nervous/anxious.     Blood pressure 139/75, pulse 98, temperature 98.9 F (37.2 C), temperature source Oral, resp. rate 16, height 5\' 4"  (1.626 m), weight 60.4 kg, SpO2 100 %.Body mass index is 22.86  kg/m.  General Appearance: Casual  Eye Contact:  Fair  Speech:  Pressured  Volume:  Increased  Mood:  Anxious  Affect:  Congruent  Thought Process:  Disorganized  Orientation:  Negative  Thought Content:  Illogical, Paranoid Ideation and Rumination  Suicidal Thoughts:  No  Homicidal Thoughts:  No  Memory:  Immediate;   Fair Recent;   Poor Remote;   Poor  Judgement:  Impaired  Insight:  Shallow  Psychomotor Activity:  Increased  Concentration:  Concentration: Poor  Recall:  Poor  Fund of Knowledge:  Fair  Language:  Fair  Akathisia:  No  Handed:  Right  AIMS (if indicated):     Assets:  Desire for Improvement Housing Resilience  ADL's:  Impaired  Cognition:  Impaired,  Mild  Sleep:  Number of Hours: 5.25     Treatment Plan Summary: Daily contact with patient to assess and evaluate symptoms and progress in treatment, Medication management and Plan Patient remains manic with only minimal improvement despite compliance with medicine.  After some consideration I think the most reasonable thing to do is to add lithium on a very careful schedule considering her dialysis.  I think the best way to do this is to have her start with a single dose of 300 mg lithium and then take it only Monday  Wednesday and Friday immediately after dialysis sessions.  Within a couple of tries we should be able to get an idea whether this will give [place] a decent therapeutic level.  Patient understands and was agreeable to the plan.  Continue current medicines as well.  Alethia Berthold, MD 01/01/2019, 6:18 PM

## 2019-01-01 NOTE — [facility] Group Notes (Signed)
LCSW Group Therapy Note   01/01/2019 1:44 PM   Type of Therapy and Topic:  Group Therapy:  Overcoming Obstacles   Participation Level:  Did Not Attend   Description of Group:    In this group patients will be encouraged to explore what they see as obstacles to their own wellness and recovery. They will be guided to discuss their thoughts, feelings, and behaviors related to these obstacles. The group will process together ways to cope with barriers, with attention given to specific choices patients can make. Each patient will be challenged to identify changes they are motivated to make in order to overcome their obstacles. This group will be process-oriented, with patients participating in exploration of their own experiences as well as giving and receiving support and challenge from other group members.   Therapeutic Goals: 1. Patient will identify personal and current obstacles as they relate to admission. 2. Patient will identify barriers that currently interfere with their wellness or overcoming obstacles.  3. Patient will identify feelings, thought process and behaviors related to these barriers. 4. Patient will identify two changes they are willing to make to overcome these obstacles:      Summary of Patient Progress x     Therapeutic Modalities:   Cognitive Behavioral Therapy Solution Focused Therapy Motivational Interviewing Relapse Prevention Therapy  Evalina Field, MSW, LCSW Clinical Social Work 01/01/2019 1:44 PM

## 2019-01-02 DIAGNOSIS — F312 Bipolar disorder, current episode manic severe with psychotic features: Secondary | ICD-10-CM | POA: Diagnosis not present

## 2019-01-02 MED ORDER — EPOETIN ALFA 10000 UNIT/ML IJ SOLN
10000.0000 [IU] | INTRAMUSCULAR | Status: DC
  Administered 2019-01-05 – 2019-01-07 (×2): 10000 [IU] via INTRAVENOUS
  Filled 2019-01-02 (×3): qty 1

## 2019-01-02 NOTE — Plan of Care (Signed)
Patient is alert to herself and place. Patient denies SI, HI and AVH but remains very talkative. Patient took morning medication but refused Depakote after dialysis, stating "I will take it at night, that's what time I am supposed to take it anyway." Patient continues to refuse taking a shower but complains about clothes not being washed, even though patient has been made aware a washing machine is present on the unit free to patients. Patient seem to have a hard time comprehending some information. Patient is suspicious and guarded as well stating, "Please lock up the doors because people are coming in and out stealing everything out of my room." Problem: Education: Goal: Knowledge of [place] Education information/materials will improve Outcome: Not Progressing   Problem: Coping: Goal: Ability to verbalize frustrations and anger appropriately will improve Outcome: Not Progressing Goal: Ability to demonstrate self-control will improve Outcome: Not Progressing   Problem: Safety: Goal: Periods of time without injury will increase Outcome: Not Progressing   Problem: Health Behavior/Discharge Planning: Goal: Compliance with prescribed medication regimen will improve Outcome: Not Progressing   Problem: Self-Care: Goal: Ability to participate in self-care as condition permits will improve Outcome: Not Progressing

## 2019-01-02 NOTE — Progress Notes (Signed)
HD Tx End   01/02/19 1209  Vital Signs  Pulse Rate (!) 105  Resp 18  BP (!) 154/83  Oxygen Therapy  SpO2 100 %  Pain Assessment  Pain Scale 0-10  Pain Score 0  Dialysis Weight  Weight  (unable to weigh d/t pt being in chair)  During Hemodialysis Assessment  Blood Flow Rate (mL/min) 200 mL/min  Arterial Pressure (mmHg) -170 mmHg  Venous Pressure (mmHg) 240 mmHg  Transmembrane Pressure (mmHg) 60 mmHg  Ultrafiltration Rate (mL/min) 500 mL/min  Dialysate Flow Rate (mL/min) 600 ml/min  Conductivity: Machine  14  HD Safety Checks Performed Yes  Dialysis Fluid Bolus Normal Saline  Bolus Amount (mL) 250 mL  Intra-Hemodialysis Comments Tx completed

## 2019-01-02 NOTE — Progress Notes (Signed)
[facility] MD Progress Note  01/02/2019 4:23 PM Madison [name]  MRN:  [id number] Subjective: Follow-up for this patient with long history of bipolar disorder currently with manic symptoms.  Patient continues to be confused agitated intrusive and hyperverbal.  Little change from presentation.  She has not been violent to anyone not expressing any suicidality but continues to seem paranoid and confused.  Tolerating medicine so far without any apparent side effect.  Dialysis is continuing as normal. Principal Problem: Bipolar affective disorder, current episode manic with psychotic symptoms ([place]) Diagnosis: Principal Problem:   Bipolar affective disorder, current episode manic with psychotic symptoms ([place]) Active Problems:   End stage renal disease ([place])  Total Time spent with patient: 30 minutes  Past Psychiatric History: Patient has a lifelong history of bipolar or schizoaffective disorder which had previously been stable mostly on Zyprexa.  Past Medical History:  Past Medical History:  Diagnosis Date  . Anxiety   . ESRD (end stage renal disease) on dialysis ([place])   . Neuropathy   . Schizophrenia [facility])     Past Surgical History:  Procedure Laterality Date  . CHOLECYSTECTOMY    . COLONOSCOPY N/A 01/20/2016   Dr. Oneida Alar: 12 mm tubular adenoma removed from the transverse colon, 4 hyperplastic polyps removed from the rectum and sigmoid colon.  Next colonoscopy planned for April 2020.  Marland Kitchen ESOPHAGOGASTRODUODENOSCOPY N/A 12/19/2017   Procedure: ESOPHAGOGASTRODUODENOSCOPY (EGD);  Surgeon: [name], MD;  [facility];  Service: Endoscopy;  Laterality: N/A;  8:30am  . fistula left arm    . GIVENS CAPSULE STUDY N/A 01/14/2018   Procedure: GIVENS CAPSULE STUDY;  Surgeon: [name], MD;  [facility];  Service: Endoscopy;  Laterality: N/A;  7:30am  . HEMICOLECTOMY Right   . TUBAL LIGATION     Family History:  Family History  Problem Relation Age of Onset  .  Alzheimer's disease Mother   . Cancer Mother        pancreatic  . Prostate cancer Father   . Kidney failure Sister   . Breast cancer Neg Hx   . Colon cancer Neg Hx    Family Psychiatric  History: See previous.  Patient had a twin sister, now deceased, who also had chronic mental illness Social History:  Social History   Substance and Sexual Activity  Alcohol Use No     Social History   Substance and Sexual Activity  Drug Use No    Social History   Socioeconomic History  . Marital status: Single    Spouse name: Not on file  . Number of children: Not on file  . Years of education: Not on file  . Highest education level: Not on file  Occupational History  . Not on file  Social Needs  . Financial resource strain: Not on file  . Food insecurity:    Worry: Not on file    Inability: Not on file  . Transportation needs:    Medical: Not on file    Non-medical: Not on file  Tobacco Use  . Smoking status: Never Smoker  . Smokeless tobacco: Never Used  Substance and Sexual Activity  . Alcohol use: No  . Drug use: No  . Sexual activity: Yes  Lifestyle  . Physical activity:    Days per week: Not on file    Minutes per session: Not on file  . Stress: Not on file  Relationships  . Social connections:    Talks on phone: Not  on file    Gets together: Not on file    Attends religious service: Not on file    Active member of club or organization: Not on file    Attends meetings of clubs or organizations: Not on file    Relationship status: Not on file  Other Topics Concern  . Not on file  Social History Narrative  . Not on file   Additional Social History:                         Sleep: Fair  Appetite:  Fair  Current Medications: Current Facility-Administered Medications  Medication Dose Route Frequency Provider Last Rate Last Dose  . acetaminophen (TYLENOL) tablet 650 mg  650 mg Oral Q6H PRN Clapacs, Madie Reno, MD   650 mg at 01/02/19 1516  . alum & mag  hydroxide-simeth (MAALOX/MYLANTA) 200-200-20 MG/5ML suspension 30 mL  30 mL Oral Q4H PRN Lamont Dowdy, NP      . Chlorhexidine Gluconate Cloth 2 % PADS 6 each  6 each Topical Q0600 Murlean Iba, MD      . divalproex (DEPAKOTE) DR tablet 250 mg  250 mg Oral TID Larita Fife, MD   250 mg at 01/02/19 0759  . [START ON 01/05/2019] epoetin alfa (EPOGEN,PROCRIT) injection 10,000 Units  10,000 Units Intravenous Q M,W,F-HD Kolluru, Sarath, MD      . gabapentin (NEURONTIN) capsule 300 mg  300 mg Oral QHS Clapacs, John T, MD   300 mg at 01/01/19 2141  . lidocaine-prilocaine (EMLA) cream   Topical Q M,W,F Singh, Harmeet, MD      . lithium carbonate capsule 300 mg  300 mg Oral Once per day on Mon Wed Fri Clapacs, John T, MD      . mirtazapine (REMERON) tablet 30 mg  30 mg Oral QHS Clapacs, Madie Reno, MD   30 mg at 01/01/19 2141  . multivitamin with minerals tablet 1 tablet  1 tablet Oral Daily Clapacs, Madie Reno, MD   1 tablet at 01/02/19 0758  . OLANZapine (ZYPREXA) tablet 20 mg  20 mg Oral QHS Clapacs, Madie Reno, MD   20 mg at 01/01/19 2141  . OLANZapine (ZYPREXA) tablet 5 mg  5 mg Oral Daily Clapacs, Madie Reno, MD   5 mg at 01/02/19 0758  . pantoprazole (PROTONIX) EC tablet 40 mg  40 mg Oral Daily Clapacs, Madie Reno, MD   40 mg at 01/02/19 0759  . senna (SENOKOT) tablet 8.6-17.2 mg  1-2 tablet Oral QHS PRN Lamont Dowdy, NP   17.2 mg at 12/29/18 2226  . temazepam (RESTORIL) capsule 15 mg  15 mg Oral QHS Clapacs, Madie Reno, MD   15 mg at 01/01/19 2141  . thiamine (VITAMIN B-1) tablet 50 mg  50 mg Oral Daily Clapacs, Madie Reno, MD   50 mg at 01/02/19 0759    Lab Results:  Results for orders placed or performed during the hospital encounter of 12/25/18 (from the past 48 hour(s))  Renal function panel     Status: Abnormal   Collection Time: 01/01/19  6:53 AM  Result Value Ref Range   Sodium 139 135 - 145 mmol/L   Potassium 5.7 (H) 3.5 - 5.1 mmol/L   Chloride 98 98 - 111 mmol/L   CO2 29 22 - 32 mmol/L    Glucose, Bld 86 70 - 99 mg/dL   BUN 48 (H) 8 - 23 mg/dL   Creatinine, Ser 5.58 (H) 0.44 - 1.00 mg/dL  Calcium 9.4 8.9 - 10.3 mg/dL   Phosphorus 3.4 2.5 - 4.6 mg/dL   Albumin 3.3 (L) 3.5 - 5.0 g/dL   GFR calc non Af Amer 7 (L) >60 mL/min   GFR calc Af Amer 8 (L) >60 mL/min   Anion gap 12 5 - 15    Comment: Performed at [facility], [address] [place], [place] [address]  CBC     Status: Abnormal   Collection Time: 01/01/19  6:53 AM  Result Value Ref Range   WBC 4.9 4.0 - 10.5 K/uL   RBC 3.09 (L) 3.87 - 5.11 MIL/uL   Hemoglobin 9.4 (L) 12.0 - 15.0 g/dL   HCT 29.4 (L) 36.0 - 46.0 %   MCV 95.1 80.0 - 100.0 fL   MCH 30.4 26.0 - 34.0 pg   MCHC 32.0 30.0 - 36.0 g/dL   RDW 18.3 (H) 11.5 - 15.5 %   Platelets 189 150 - 400 K/uL   nRBC 0.0 0.0 - 0.2 %    Comment: Performed at [facility], [address] [place], [place] [address]    Blood Alcohol level:  Lab Results  Component Value Date   [place] <10 01/15/2019   ETH <5 78/58/8502    Metabolic Disorder Labs: No results found for: HGBA1C, MPG No results found for: PROLACTIN No results found for: CHOL, TRIG, HDL, CHOLHDL, VLDL, LDLCALC  Physical Findings: AIMS:  , ,  ,  ,    CIWA:    COWS:     Musculoskeletal: Strength & Muscle Tone: within normal limits Gait & Station: normal Patient leans: N/A  Psychiatric Specialty Exam: Physical Exam  Nursing note and vitals reviewed. Constitutional: She appears well-developed and well-nourished.  HENT:  Head: Normocephalic and atraumatic.  Eyes: Pupils are equal, round, and reactive to light. Conjunctivae are normal.  Neck: Normal range of motion.  Cardiovascular: Regular rhythm and normal heart sounds.  Respiratory: Effort normal. No respiratory distress.  GI: Soft.  Musculoskeletal: Normal range of motion.  Neurological: She is alert.  Skin: Skin is warm and dry.  Psychiatric: Her affect is labile and inappropriate. Her speech is rapid and/or pressured  and tangential. She is agitated. She is not aggressive. Thought content is paranoid and delusional. Cognition and memory are impaired. She expresses impulsivity. She expresses no homicidal and no suicidal ideation. She exhibits abnormal recent memory.    Review of Systems  Constitutional: Negative.   HENT: Negative.   Eyes: Negative.   Respiratory: Negative.   Cardiovascular: Negative.   Gastrointestinal: Negative.   Musculoskeletal: Negative.   Skin: Negative.   Neurological: Negative.   Psychiatric/Behavioral: Positive for memory loss. Negative for depression, substance abuse and suicidal ideas. The patient is nervous/anxious.     Blood pressure (!) 158/72, pulse (!) 103, temperature 98.2 F (36.8 C), temperature source Oral, resp. rate 16, height 5\' 4"  (1.626 m), weight 60.4 kg, SpO2 100 %.Body mass index is 22.86 kg/m.  General Appearance: Casual  Eye Contact:  Fair  Speech:  Pressured  Volume:  Increased  Mood:  Anxious and Irritable  Affect:  Inappropriate and Labile  Thought Process:  Disorganized  Orientation:  Negative  Thought Content:  Illogical, Paranoid Ideation and Rumination  Suicidal Thoughts:  No  Homicidal Thoughts:  No  Memory:  Immediate;   Fair Recent;   Poor Remote;   Fair  Judgement:  Impaired  Insight:  Shallow  Psychomotor Activity:  Increased  Concentration:  Concentration: Poor  Recall:  Poor  Fund of Knowledge:  Fair  Language:  Fair  Akathisia:  No  Handed:  Right  AIMS (if indicated):     Assets:  Desire for Improvement Housing Social Support  ADL's:  Intact  Cognition:  Impaired,  Mild  Sleep:  Number of Hours: 5.5     Treatment Plan Summary: Daily contact with patient to assess and evaluate symptoms and progress in treatment, Medication management and Plan Patient continues to present as manic.  I have made the change starting yesterday of very carefully adding lithium on a special schedule considering her dialysis.  Depakote level  will be checked Sunday morning.  Lithium level can probably be checked sometime in the next week.  Continue supportive education, attempt to keep her behavior controlled and unintrusive.  Continue dialysis as scheduled.  No change to Zyprexa.  Alethia Berthold, MD 01/02/2019, 4:23 PM

## 2019-01-02 NOTE — Plan of Care (Signed)
Patient continue to display hyperactive, hyper verbal, upon approach, talks a lot standing or pacing, on assessment patient said I want to go home. Patient has no insights into her condition, education and support is provided , denies any SI/HI/AVH, medication is given with compliance and no side effects , patient is easily redirectable to safety and no clutters in the room 15 minutes safety rounding is maintained no distress.   Problem: Education: Goal: Knowledge of [facility] Education information/materials will improve Outcome: Progressing   Problem: Coping: Goal: Ability to verbalize frustrations and anger appropriately will improve Outcome: Progressing Goal: Ability to demonstrate self-control will improve Outcome: Progressing   Problem: Safety: Goal: Periods of time without injury will increase Outcome: Progressing   Problem: Health Behavior/Discharge Planning: Goal: Compliance with prescribed medication regimen will improve Outcome: Progressing   Problem: Self-Care: Goal: Ability to participate in self-care as condition permits will improve Outcome: Progressing   Problem: Education: Goal: Ability to state activities that reduce stress will improve Outcome: Progressing   Problem: Coping: Goal: Ability to identify and develop effective coping behavior will improve Outcome: Progressing   Problem: Self-Concept: Goal: Ability to identify factors that promote anxiety will improve Outcome: Progressing Goal: Level of anxiety will decrease Outcome: Progressing Goal: Ability to modify response to factors that promote anxiety will improve Outcome: Progressing

## 2019-01-02 NOTE — Progress Notes (Signed)
Post HD Tx  1 liter removed, tolerated well. Hemostasis achieved with left upper arm AV fistula.     01/02/19 1226  Hand-Off documentation  Report given to (Full Name) Jerry Caras RN  Report received from (Full Name) Trellis Paganini RN  Vital Signs  Temp 98.2 F (36.8 C)  Temp Source Oral  Pulse Rate (!) 103  Pulse Rate Source Monitor  Resp 16  BP (!) 158/72  BP Location Right Arm  BP Method Automatic  Patient Position (if appropriate) Sitting  Oxygen Therapy  SpO2 100 %  O2 Device Room Air  Post-Hemodialysis Assessment  Rinseback Volume (mL) 250 mL  KECN 68.9 V  Dialyzer Clearance Lightly streaked  Duration of HD Treatment -hour(s) 3 hour(s)  Hemodialysis Intake (mL) 500 mL  UF Total -Machine (mL) 1500 mL  Net UF (mL) 1000 mL  Tolerated HD Treatment Yes  AVG/AVF Arterial Site Held (minutes) 10 minutes  AVG/AVF Venous Site Held (minutes) 10 minutes

## 2019-01-02 NOTE — Progress Notes (Signed)
Pre HD Tx  Pt arrived with sitter. Tangential speaking about her family and past, continuous with few breaks. Pleasant, calm and cooperative. HD Tx initiated with 15G needles using left upper arm AV fistula, +B/T. Plan for 1 liter of fluid removal over 3 hour tx period.      01/02/19 0830  Hand-Off documentation  Report given to (Full Name) Trellis Paganini RN  Report received from (Full Name) Jerry Caras RN  Vital Signs  Temp 98.6 F (37 C)  Temp Source Oral  Pulse Rate (!) 108  Pulse Rate Source Monitor  Resp 18  BP (!) 172/88  BP Location Right Arm  BP Method Automatic  Patient Position (if appropriate) Sitting  Oxygen Therapy  SpO2 100 %  O2 Device Room Air  Pain Assessment  Pain Scale 0-10  Pain Score 0  Dialysis Weight  Weight  (unable to weigh pt - in HD recliner)  Time-Out for Hemodialysis  What Procedure? Hemodialysis  Pt Identifiers(min of two) First/Last Name;MRN/Account#  Correct Site? Yes  Correct Side? Yes  Correct Procedure? Yes  Consents Verified? Yes  Rad Studies Available? N/A  Safety Precautions Reviewed? Yes  Engineer, civil (consulting) Number 4  Station Number 4  UF/Alarm Test Passed  Conductivity: Meter 14  Conductivity: Machine  13.8  pH 7.4  Reverse Osmosis Main   Normal Saline Lot Number [id number]  Dialyzer Lot Number [id number]  Disposable Set Lot Number [id number]  Machine Temperature 98.6 F (37 C)  Musician and Audible Yes  Blood Lines Intact and Secured Yes  Pre Treatment Patient Checks  Vascular access used during treatment Fistula  Patient is receiving dialysis in a chair Yes  Hepatitis B Surface Antigen Results Negative  Date Hepatitis B Surface Antigen Drawn 12/16/18  Hepatitis B Surface Antibody  (<10)  Date Hepatitis B Surface Antibody Drawn 12/16/18  Hemodialysis Consent Verified Yes  Hemodialysis Standing Orders Initiated Yes  ECG (Telemetry) Monitor On Yes  Prime Ordered Normal Saline  Length of  DialysisTreatment  -hour(s) 3 Hour(s)  Dialysis Treatment Comments Na 140  Dialyzer Elisio 17H NR  Dialysate 3K, 2.5 Ca  Dialysate Flow Ordered 600  Blood Flow Rate Ordered 400 mL/min  Ultrafiltration Goal 1 Liters  Dialysis Blood Pressure Support Ordered Normal Saline  Education / Care Plan  Dialysis Education Provided Yes  Documented Education in Care Plan Yes

## 2019-01-02 NOTE — Progress Notes (Signed)
[facility]  ROUNDING NOTE   Subjective:   Seen and examined on hemodialysis. Tolerating treatment well.  Seated in chair. Sitter at chair side.   Manic with tangental thoughts and speech.     HEMODIALYSIS FLOWSHEET:  Blood Flow Rate (mL/min): 200 mL/min Arterial Pressure (mmHg): -170 mmHg Venous Pressure (mmHg): 240 mmHg Transmembrane Pressure (mmHg): 60 mmHg Ultrafiltration Rate (mL/min): 500 mL/min Dialysate Flow Rate (mL/min): 600 ml/min Conductivity: Machine : 14 Conductivity: Machine : 14 Dialysis Fluid Bolus: Normal Saline Bolus Amount (mL): 250 mL    Objective:  Vital signs in last 24 hours:  Temp:  [98.2 F (36.8 C)-98.9 F (37.2 C)] 98.2 F (36.8 C) (04/10 1226) Pulse Rate:  [99-115] 103 (04/10 1226) Resp:  [16-25] 16 (04/10 1226) BP: (154-178)/(70-89) 158/72 (04/10 1226) SpO2:  [99 %-100 %] 100 % (04/10 1226)  Weight change:  Filed Weights    Intake/Output: No intake/output data recorded.   Intake/Output this shift:  Total I/O In: -  Out: 1000 [Other:1000]  Physical Exam: General: NAD, seated in a chair  Head: Normocephalic, atraumatic. Moist oral mucosal membranes  Eyes: Anicteric, PERRL  Neck: Supple, trachea midline  Lungs:  Clear to auscultation  Heart: Regular rate and rhythm  Abdomen:  Soft, nontender,   Extremities: no peripheral edema.  Neurologic: Nonfocal, moving all four extremities  Skin: No lesions  Access: Left AVG    Basic Metabolic Panel: Recent Labs  Lab 12/29/18 0910 01/01/19 0653  NA 132* 139  K 5.6* 5.7*  CL 93* 98  CO2 23 29  GLUCOSE 127* 86  BUN 100* 48*  CREATININE 9.82* 5.58*  CALCIUM 10.0 9.4  PHOS 3.2 3.4    Liver Function Tests: Recent Labs  Lab 12/29/18 0910 01/01/19 0653  ALBUMIN 3.4* 3.3*   No results for input(s): LIPASE, AMYLASE in the last 168 hours. No results for input(s): AMMONIA in the last 168 hours.  CBC: Recent Labs  Lab 01/01/19 0653  WBC 4.9  HGB 9.4*  HCT  29.4*  MCV 95.1  PLT 189    Cardiac Enzymes: No results for input(s): CKTOTAL, CKMB, CKMBINDEX, TROPONINI in the last 168 hours.  BNP: Invalid input(s): POCBNP  CBG: Recent Labs  Lab 12/29/18 0825  GLUCAP 114*    Microbiology: Results for orders placed or performed during the hospital encounter of 08/15/17  MRSA PCR Screening     Status: Abnormal   Collection Time: 08/16/17  8:28 PM  Result Value Ref Range Status   MRSA by PCR POSITIVE (A) NEGATIVE Final    Comment:        The GeneXpert MRSA Assay (FDA approved for NASAL specimens only), is one component of a comprehensive MRSA colonization surveillance program. It is not intended to diagnose MRSA infection nor to guide or monitor treatment for MRSA infections. RESULT CALLED TO, READ BACK BY AND VERIFIED WITH: CRYSTAL BASS AT 2146 08/16/17.PMH     Coagulation Studies: No results for input(s): LABPROT, INR in the last 72 hours.  Urinalysis: No results for input(s): COLORURINE, LABSPEC, PHURINE, GLUCOSEU, HGBUR, BILIRUBINUR, KETONESUR, PROTEINUR, UROBILINOGEN, NITRITE, LEUKOCYTESUR in the last 72 hours.  Invalid input(s): APPERANCEUR    Imaging: No results found.   Medications:    . Chlorhexidine Gluconate Cloth  6 each Topical Q0600  . divalproex  250 mg Oral TID  . gabapentin  300 mg Oral QHS  . lidocaine-prilocaine   Topical Q M,W,F  . lithium carbonate  300 mg Oral Once per day on Mon  Wed Fri  . mirtazapine  30 mg Oral QHS  . multivitamin with minerals  1 tablet Oral Daily  . OLANZapine  20 mg Oral QHS  . OLANZapine  5 mg Oral Daily  . pantoprazole  40 mg Oral Daily  . temazepam  15 mg Oral QHS  . thiamine  50 mg Oral Daily   acetaminophen, alum & mag hydroxide-simeth, senna  Assessment/ Plan:  Ms. Madison GOEDEN is a 68 y.o. black female with end-stage renal disease, bipolar disorder, hypertension was admitted on 12/25/2018 with manic symptoms.   MWF [facility]    1.  End-stage renal disease: seen and examined on hemodialysis treatment. Tolerating treatment well.  - Continue MWF schedule  2.  Anemia of chronic kidney disease: hemoglobin 9.4. Mircera as outpatient - EPO with HD treatments.   3.  Secondary hyperparathyroidism. - Currently off her Auryxia  4. Hypertension: 158/73 . Not currently on blood pressure agents.    LOS: [place] 4/10/20202:46 PM

## 2019-01-02 NOTE — Progress Notes (Signed)
Recreation Therapy Notes    Date: 01/02/2019  Time: 9:30 am   Location: Craft room   Behavioral response: N/A   Intervention Topic: Time Management  Discussion/Intervention: Patient did not attend group.   Clinical Observations/Feedback:  Patient did not attend group.   Jenicka Coxe LRT/CTRS          Madison [name] 01/02/2019 11:14 AM

## 2019-01-02 NOTE — Progress Notes (Signed)
HD Tx Start   01/02/19 0909  Vital Signs  Pulse Rate (!) 109  Resp 18  BP (!) 165/78  Oxygen Therapy  SpO2 99 %  During Hemodialysis Assessment  Blood Flow Rate (mL/min) 400 mL/min  Arterial Pressure (mmHg) -160 mmHg  Venous Pressure (mmHg) 140 mmHg  Transmembrane Pressure (mmHg) 40 mmHg  Ultrafiltration Rate (mL/min) 500 mL/min (500 mL per HOUR removed)  Dialysate Flow Rate (mL/min) 600 ml/min  Conductivity: Machine  14  HD Safety Checks Performed Yes  Dialysis Fluid Bolus Normal Saline  Bolus Amount (mL) 250 mL  Intra-Hemodialysis Comments Tx initiated

## 2019-01-02 NOTE — Progress Notes (Signed)
Pre HD Assessment  Pt arrived with sitter. Tangential speaking about her family and past, continuous with few breaks. Pleasant, calm and cooperative. HD Tx initiated with 15G needles using left upper arm AV fistula, +B/T. Plan for 1 liter of fluid removal over 3 hour tx period.      01/02/19 0850  Neurological  Level of Consciousness Alert  Orientation Level Oriented to person;Oriented to place;Disoriented to time;Disoriented to situation  Respiratory  Respiratory Pattern Regular  Chest Assessment Chest expansion symmetrical  Bilateral Breath Sounds Clear  Cardiac  Pulse Regular  Heart Sounds S1, S2  ECG Monitor Yes  Cardiac Rhythm NSR  Vascular  R Dorsalis Pedis Pulse +2  L Dorsalis Pedis Pulse +2  Integumentary  Integumentary (WDL) X  Skin Color Appropriate for ethnicity  Musculoskeletal  Musculoskeletal (WDL) WDL  Gastrointestinal  Bowel Sounds Assessment Active  GU Assessment  Genitourinary (WDL) X  Genitourinary Symptoms Oliguria (HD pt)  Psychosocial  Patient Behaviors Cooperative;Anxious;Restless

## 2019-01-02 NOTE — Progress Notes (Signed)
Post HD Assessment  1 liter removed, tolerated well. Hemostasis achieved with left upper arm AV fistula.     01/02/19 1228  Neurological  Level of Consciousness Alert  Orientation Level Oriented to person;Oriented to place;Disoriented to time;Disoriented to situation  Respiratory  Respiratory Pattern Regular  Cardiac  Pulse Regular  Heart Sounds S1, S2  ECG Monitor Yes  Cardiac Rhythm NSR  Vascular  R Dorsalis Pedis Pulse +2  L Dorsalis Pedis Pulse +2  Integumentary  Integumentary (WDL) X  Skin Color Appropriate for ethnicity  Musculoskeletal  Musculoskeletal (WDL) WDL  Gastrointestinal  Bowel Sounds Assessment Active  GU Assessment  Genitourinary (WDL) X  Genitourinary Symptoms Oliguria (HD pt)  Psychosocial  Patient Behaviors Cooperative;Anxious;Restless

## 2019-01-03 DIAGNOSIS — F250 Schizoaffective disorder, bipolar type: Secondary | ICD-10-CM

## 2019-01-03 LAB — CBC
HCT: 25.6 % — ABNORMAL LOW (ref 36.0–46.0)
Hemoglobin: 8.2 g/dL — ABNORMAL LOW (ref 12.0–15.0)
MCH: 30.8 pg (ref 26.0–34.0)
MCHC: 32 g/dL (ref 30.0–36.0)
MCV: 96.2 fL (ref 80.0–100.0)
Platelets: 170 10*3/uL (ref 150–400)
RBC: 2.66 MIL/uL — ABNORMAL LOW (ref 3.87–5.11)
RDW: 18.2 % — ABNORMAL HIGH (ref 11.5–15.5)
WBC: 5.6 10*3/uL (ref 4.0–10.5)
nRBC: 0 % (ref 0.0–0.2)

## 2019-01-03 LAB — RENAL FUNCTION PANEL
Albumin: 3.2 g/dL — ABNORMAL LOW (ref 3.5–5.0)
Anion gap: 10 (ref 5–15)
BUN: 52 mg/dL — ABNORMAL HIGH (ref 8–23)
CO2: 28 mmol/L (ref 22–32)
Calcium: 9.6 mg/dL (ref 8.9–10.3)
Chloride: 98 mmol/L (ref 98–111)
Creatinine, Ser: 5.49 mg/dL — ABNORMAL HIGH (ref 0.44–1.00)
GFR calc Af Amer: 8 mL/min — ABNORMAL LOW (ref >60)
GFR calc non Af Amer: 7 mL/min — ABNORMAL LOW (ref >60)
Glucose, Bld: 107 mg/dL — ABNORMAL HIGH (ref 70–99)
Phosphorus: 3.7 mg/dL (ref 2.5–4.6)
Potassium: 5.7 mmol/L — ABNORMAL HIGH (ref 3.5–5.1)
Sodium: 136 mmol/L (ref 135–145)

## 2019-01-03 MED ORDER — GABAPENTIN 100 MG PO CAPS
100.0000 mg | ORAL_CAPSULE | Freq: Two times a day (BID) | ORAL | Status: DC
  Administered 2019-01-03 – 2019-01-09 (×11): 100 mg via ORAL
  Filled 2019-01-03 (×12): qty 1

## 2019-01-03 NOTE — Progress Notes (Signed)
D: Patient complaining of body aches and her temperature is 99.5. Not coughing or complaining of sore throat. A: Continue to monitor and offer support. R: Safety maintained.

## 2019-01-03 NOTE — Progress Notes (Signed)
[facility] MD Progress Note  01/03/2019 9:22 AM Madison [name]  MRN:  [id number]    Subjective:   The patient slept fairly well last night but remains hypomanic with pressured speech.  She was hyperverbal and thought processes were tangential and even loose at times.  She talked briefly about her mother's death and how she has not been doing well since.  She denies any current active or passive suicidal thoughts.  She denies any auditory or visual hallucinations.  She is having some paranoid thoughts and believes that people are stealing things from her room..  She has been attending groups and feels that they are helpful for her although she cannot verbalize exactly how.  She did have a slight temp of 99.5.  She was tachycardic and blood pressure was slightly elevated.  Patient is also being followed by nephrology and is getting dialysis regularly.  She is compliant with psychotropic medications and so far denies any adverse side effects.  Valproic acid level scheduled for April 12.  She did report arthritic pain stating that she had had bilateral hip pain, back pain and chronic neck pain.  Principal Problem: Bipolar affective disorder, current episode manic with psychotic symptoms ([place]) Diagnosis: Principal Problem:   Bipolar affective disorder, current episode manic with psychotic symptoms ([place]) Active Problems:   End stage renal disease ([place])  Total Time spent with patient: 20 minutes  Past Psychiatric History: Patient has evidently had mental health problems most of her life.  She talks [facility].  She was born with a twin sister who from what the patient is saying also had mental health problems.  Sister died several years ago which is documented as having been a huge stress for her.  Patient denies ever having tried to kill herself in the past.  No report of any violence to others.  Patient could not remember all the medicine  she has taken in the past but at one point she started talking about lithium which makes me think may be that was used at some time in the past but recently it looks like Zyprexa and mirtazapine have been the main stays.  Past Medical History:  Past Medical History:  Diagnosis Date  . Anxiety   . ESRD (end stage renal disease) on dialysis ([place])   . Neuropathy   . Schizophrenia [facility])     Past Surgical History:  Procedure Laterality Date  . CHOLECYSTECTOMY    . COLONOSCOPY N/A 01/20/2016   Dr. Oneida Alar: 12 mm tubular adenoma removed from the transverse colon, 4 hyperplastic polyps removed from the rectum and sigmoid colon.  Next colonoscopy planned for April 2020.  Marland Kitchen ESOPHAGOGASTRODUODENOSCOPY N/A 12/19/2017   Procedure: ESOPHAGOGASTRODUODENOSCOPY (EGD);  Surgeon: [name], MD;  Location: [facility];  Service: Endoscopy;  Laterality: N/A;  8:30am  . fistula left arm    . GIVENS CAPSULE STUDY N/A 01/14/2018   Procedure: GIVENS CAPSULE STUDY;  Surgeon: [name], MD;  Location: [facility];  Service: Endoscopy;  Laterality: N/A;  7:30am  . HEMICOLECTOMY Right   . TUBAL LIGATION     Family History:  Family History  Problem Relation Age of Onset  . Alzheimer's disease Mother   . Cancer Mother        pancreatic  . Prostate cancer Father   . Kidney failure Sister   . Breast cancer Neg Hx   . Colon cancer Neg Hx  Family Psychiatric  History: See previous.  Patient had a twin sister, now deceased, who also had chronic mental illness Social History:  Social History   Substance and Sexual Activity  Alcohol Use No     Social History   Substance and Sexual Activity  Drug Use No    Social History   Socioeconomic History  . Marital status: Single    Spouse name: Not on file  . Number of children: Not on file  . Years of education: Not on file  . Highest education level: Not on file  Occupational History  . Not on file  Social Needs  . Financial resource strain:  Not on file  . Food insecurity:    Worry: Not on file    Inability: Not on file  . Transportation needs:    Medical: Not on file    Non-medical: Not on file  Tobacco Use  . Smoking status: Never Smoker  . Smokeless tobacco: Never Used  Substance and Sexual Activity  . Alcohol use: No  . Drug use: No  . Sexual activity: Yes  Lifestyle  . Physical activity:    Days per week: Not on file    Minutes per session: Not on file  . Stress: Not on file  Relationships  . Social connections:    Talks on phone: Not on file    Gets together: Not on file    Attends religious service: Not on file    Active member of club or organization: Not on file    Attends meetings of clubs or organizations: Not on file    Relationship status: Not on file  Other Topics Concern  . Not on file  Social History Narrative  . Not on file   Additional Social History:                         Sleep: Fair  Appetite:  Fair  Current Medications: Current Facility-Administered Medications  Medication Dose Route Frequency Provider Last Rate Last Dose  . acetaminophen (TYLENOL) tablet 650 mg  650 mg Oral Q6H PRN Clapacs, Madie Reno, MD   650 mg at 01/03/19 8242  . alum & mag hydroxide-simeth (MAALOX/MYLANTA) 200-200-20 MG/5ML suspension 30 mL  30 mL Oral Q4H PRN Lamont Dowdy, NP      . Chlorhexidine Gluconate Cloth 2 % PADS 6 each  6 each Topical Q0600 Murlean Iba, MD      . divalproex (DEPAKOTE) DR tablet 250 mg  250 mg Oral TID Larita Fife, MD   250 mg at 01/03/19 0831  . [START ON 01/05/2019] epoetin alfa (EPOGEN,PROCRIT) injection 10,000 Units  10,000 Units Intravenous Q M,W,F-HD Kolluru, Sarath, MD      . gabapentin (NEURONTIN) capsule 100 mg  100 mg Oral BID Chauncey Mann, MD      . gabapentin (NEURONTIN) capsule 300 mg  300 mg Oral QHS Clapacs, Madie Reno, MD   300 mg at 01/02/19 2107  . lidocaine-prilocaine (EMLA) cream   Topical Q M,W,F Singh, Harmeet, MD      . lithium carbonate capsule  300 mg  300 mg Oral Once per day on Mon Wed Fri Clapacs, John T, MD   300 mg at 01/02/19 1703  . mirtazapine (REMERON) tablet 30 mg  30 mg Oral QHS Clapacs, John T, MD   30 mg at 01/02/19 2111  . multivitamin with minerals tablet 1 tablet  1 tablet Oral Daily Clapacs, Madie Reno, MD  1 tablet at 01/03/19 0830  . OLANZapine (ZYPREXA) tablet 20 mg  20 mg Oral QHS Clapacs, Madie Reno, MD   20 mg at 01/02/19 2108  . OLANZapine (ZYPREXA) tablet 5 mg  5 mg Oral Daily Clapacs, Madie Reno, MD   5 mg at 01/03/19 0831  . pantoprazole (PROTONIX) EC tablet 40 mg  40 mg Oral Daily Clapacs, Madie Reno, MD   40 mg at 01/03/19 0830  . senna (SENOKOT) tablet 8.6-17.2 mg  1-2 tablet Oral QHS PRN Lamont Dowdy, NP   17.2 mg at 12/29/18 2226  . temazepam (RESTORIL) capsule 15 mg  15 mg Oral QHS Clapacs, Madie Reno, MD   15 mg at 01/02/19 2108  . thiamine (VITAMIN B-1) tablet 50 mg  50 mg Oral Daily Clapacs, Madie Reno, MD   50 mg at 01/03/19 0830    Lab Results:  Results for orders placed or performed during the hospital encounter of 12/25/18 (from the past 48 hour(s))  Renal function panel     Status: Abnormal   Collection Time: 01/03/19  6:41 AM  Result Value Ref Range   Sodium 136 135 - 145 mmol/L   Potassium 5.7 (H) 3.5 - 5.1 mmol/L   Chloride 98 98 - 111 mmol/L   CO2 28 22 - 32 mmol/L   Glucose, Bld 107 (H) 70 - 99 mg/dL   BUN 52 (H) 8 - 23 mg/dL   Creatinine, Ser 5.49 (H) 0.44 - 1.00 mg/dL   Calcium 9.6 8.9 - 10.3 mg/dL   Phosphorus 3.7 2.5 - 4.6 mg/dL   Albumin 3.2 (L) 3.5 - 5.0 g/dL   GFR calc non Af Amer 7 (L) >60 mL/min   GFR calc Af Amer 8 (L) >60 mL/min   Anion gap 10 5 - 15    Comment: Performed at [facility], [address] [place], [place] [address]  CBC     Status: Abnormal   Collection Time: 01/03/19  6:41 AM  Result Value Ref Range   WBC 5.6 4.0 - 10.5 K/uL   RBC 2.66 (L) 3.87 - 5.11 MIL/uL   Hemoglobin 8.2 (L) 12.0 - 15.0 g/dL   HCT 25.6 (L) 36.0 - 46.0 %   MCV 96.2 80.0 - 100.0 fL    MCH 30.8 26.0 - 34.0 pg   MCHC 32.0 30.0 - 36.0 g/dL   RDW 18.2 (H) 11.5 - 15.5 %   Platelets 170 150 - 400 K/uL   nRBC 0.0 0.0 - 0.2 %    Comment: Performed at [facility], [address] [place],  [address]    Blood Alcohol level:  Lab Results  Component Value Date   [facility] <10 12/29/2018   ETH <5 57/84/6962    Metabolic Disorder Labs: No results found for: HGBA1C, MPG No results found for: PROLACTIN No results found for: CHOL, TRIG, HDL, CHOLHDL, VLDL, LDLCALC  Musculoskeletal: Strength & Muscle Tone: within normal limits Gait & Station: normal Patient leans: N/A  Psychiatric Specialty Exam: Physical Exam  Nursing note and vitals reviewed.   Review of Systems  Constitutional: Negative.   HENT: Negative.  Negative for congestion, nosebleeds, sinus pain and sore throat.   Eyes: Negative.   Respiratory: Positive for cough. Negative for hemoptysis, sputum production, shortness of breath and wheezing.   Cardiovascular: Negative.   Gastrointestinal: Negative.   Musculoskeletal: Negative.        She has arthritic pain bilaterally in her knees and hips.  She also complains of back  pain and neck pain that is chronic.  Skin: Negative.   Neurological: Negative.   Psychiatric/Behavioral: Positive for memory loss. Negative for depression, substance abuse and suicidal ideas. The patient is nervous/anxious.     Blood pressure (!) 142/74, pulse (!) 118, temperature 99.5 F (37.5 C), temperature source Oral, resp. rate 18, height 5\' 4"  (1.626 m), weight 60.4 kg, SpO2 98 %.Body mass index is 22.86 kg/m.  General Appearance: Casual  Eye Contact: Fair  Speech:  Rapid and pressured  Volume:  Normal  Mood:  "I am not well"  Affect:  Labile and Anxious  Thought Process:  Disorganized  Orientation:  Negative  Thought Content:  Illogical, paranoid and delusioanl  Suicidal Thoughts:  No  Homicidal Thoughts:  No  Memory:  Immediate;   Poor Recent;   Poor Remote;    Poor  Judgement:  Impaired  Insight:  Poor  Psychomotor Activity:  Increased  Concentration:  Concentration: Poor  Recall:  Poor  Fund of Knowledge:  Fair  Language:  Fair  Akathisia:  No  Handed:  Right  AIMS (if indicated):     Assets:  Desire for Improvement Housing Social Support  ADL's:  Intact  Cognition:  Impaired,  Mild  Sleep:  Number of Hours: 8     Treatment Plan Summary:  Schizoaffective disorder, bipolar type End-stage renal disease Neuropathy   Madison [name] is a 68 year old female with history of schizoaffective disorder who was admitted to the hospital for psychotropic medication management and stabilization secondary to mania.  The patient developed mania approximately 2 weeks after her mother passed away.  Schizoaffective disorder, bipolar type: -Will continue on Depakote 250 mg p.o. 3 times daily with valproic acid level to be checked on January 04, 2019 -The patient will continue on lithium 300 mg p.o. 3 times a week for mood stabilization. -We will continue Zyprexa 5 mg p.o. daily and 20 mg p.o. nightly for mood stabilization.  Consider adding 5 mg daily at noon. -We will continue Remeron 30 mg p.o. nightly and temazepam 50 mg p.o. nightly for insomnia.  The patient had been on Remeron prior to admission.  We will continue for now but will titrate down off Remeron if mania continues. -EKG showed a QTC of 480 -We will check hemoglobin A1c and lipid panel  Arthritic pain -We will add Neurontin 100 mg p.o. twice daily for mood stabilization and arthritic pain  End-stage renal disease -Patient will continue on hemodialysis 3 times a week.  Appreciate input from nephrology.  Nephrology is monitoring CBC and BMP.  Disposition -Patient has a stable living situation but will psychotropic medication management follow-up appointment       Chauncey Mann, MD 01/03/2019, 9:22 AM

## 2019-01-03 NOTE — Progress Notes (Signed)
D: Affect remains flat  , continue to voice of wanting to go home . Recalls  information  Of childhood with sisters  And father  Which she worked in the [employer]  Tobacco . Patient remains manic , Patient constantly talking  To peers and staff . Informed patient of clothes brought in from home . Patient voice of not wanting  Them upset with sisters. Stated to Probation officer her sisters put her in the hospital like her father did when she was coming up in age  At [facility] [place] Patient stated slept good last night .Stated appetite is good and energy level  Is normal. Stated concentration is good . Stated on Depression scale , hopeless and anxiety .( low 0-10 high) Denies suicidal  homicidal ideations  .  No auditory hallucinations  No pain concerns . Appropriate ADL'S. Interacting with peers and staff.  A: Encourage patient participation with unit programming . Instruction  Given on  Medication , verbalize understanding. R: Voice no other concerns. Staff continue to monitor

## 2019-01-03 NOTE — [facility] Group Notes (Signed)
[facility] Group Notes:  (Nursing/MHT/Case Management/Adjunct)  Date:  01/03/2019  Time:  9:25 PM  Type of Therapy:  Group Therapy  Participation Level:  Active  Participation Quality:  Sharing  Affect:  Sharing  Cognitive:  Delusional  Insight:  Good and Limited  Engagement in Group:  Engaged  Modes of Intervention:  Discussion  Summary of Progress/Problems:  Madison [name] 01/03/2019, 9:25 PM

## 2019-01-03 NOTE — Progress Notes (Signed)
D: Patient reluctantly med compliant. Continues to talk non-stop, repeat similar themes, like "Dr. Loni Muse wanted me to take my red pills at 7". Continues to deny SI, HI and AV hallucinations. Continues to be hypomanic and hyperverbal. Redirectable. Affect is flat. Mood is anxious. Patient is oriented x1 to person only. A: Continue to monitor for safety and offer support. R: Safety maintained.

## 2019-01-03 NOTE — BHH Group Notes (Signed)
LCSW Group Therapy Note   01/03/2019 1:15pm   Type of Therapy and Topic:  Group Therapy:  Trust and Honesty  Participation Level:  Minimal  Description of Group:    In this group patients will be asked to explore the value of being honest.  Patients will be guided to discuss their thoughts, feelings, and behaviors related to honesty and trusting in others. Patients will process together how trust and honesty relate to forming relationships with peers, family members, and self. Each patient will be challenged to identify and express feelings of being vulnerable. Patients will discuss reasons why people are dishonest and identify alternative outcomes if one was truthful (to self or others). This group will be process-oriented, with patients participating in exploration of their own experiences, giving and receiving support, and processing challenge from other group members.   Therapeutic Goals: 1. Patient will identify why honesty is important to relationships and how honesty overall affects relationships.  2. Patient will identify a situation where they lied or were lied too and the  feelings, thought process, and behaviors surrounding the situation 3. Patient will identify the meaning of being vulnerable, how that feels, and how that correlates to being honest with self and others. 4. Patient will identify situations where they could have told the truth, but instead lied and explain reasons of dishonesty.   Summary of Patient Progress : The patient reported she feels "excellent." The patient did not participate after introducing herself, she fell asleep in her chair. CSW tried to wake her up and redirect her back to the group discussion, the patient was unable to stay awake.     Therapeutic Modalities:   Cognitive Behavioral Therapy Solution Focused Therapy Motivational Interviewing Brief Therapy  Marqual Mi  CUEBAS-COLON, LCSW 01/03/2019 12:32 PM

## 2019-01-03 NOTE — Plan of Care (Signed)
D: Patient reluctantly med compliant. Continues to talk non-stop, repeat similar themes, like "Dr. Loni Muse wanted me to take my red pills at 7". Continues to deny SI, HI and AV hallucinations. Continues to be hypomanic and hyperverbal. Redirectable. Affect is flat. Mood is anxious. Patient is oriented x1 to person only. A: Continue to monitor for safety and offer support. R: Safety maintained.

## 2019-01-03 NOTE — Plan of Care (Signed)
Able to Artist of any safety concerns . Patient remains unable to process information   Staff continue to redirect patient on [facility] Education . No episodes of anger or frustrations.  Compliant with medication. given . Unable  to participate in group therapy  unable to focus , or maintain  the same topic  of conversation .     Problem: Safety: Goal: Periods of time without injury will increase Outcome: Progressing  Problem: Education: Goal: Knowledge of  General Education information/materials will improve Outcome: Not Progressing   Problem: Coping: Goal: Ability to verbalize frustrations and anger appropriately will improve Outcome: Not Progressing Goal: Ability to demonstrate self-control will improve Outcome: Not Progressing   Problem: Health Behavior/Discharge Planning: Goal: Compliance with prescribed medication regimen will improve Outcome: Not Progressing   Problem: Self-Care: Goal: Ability to participate in self-care as condition permits will improve Outcome: Not Progressing   Problem: Education: Goal: Ability to state activities that reduce stress will improve Outcome: Not Progressing   Problem: Coping: Goal: Ability to identify and develop effective coping behavior will improve Outcome: Not Progressing   Problem: Self-Concept: Goal: Ability to identify factors that promote anxiety will improve Outcome: Not Progressing Goal: Level of anxiety will decrease Outcome: Not Progressing Goal: Ability to modify response to factors that promote anxiety will improve Outcome: Not Progressing

## 2019-01-04 DIAGNOSIS — F250 Schizoaffective disorder, bipolar type: Secondary | ICD-10-CM | POA: Diagnosis not present

## 2019-01-04 LAB — VALPROIC ACID LEVEL: Valproic Acid Lvl: 37 ug/mL — ABNORMAL LOW (ref 50.0–100.0)

## 2019-01-04 MED ORDER — DIVALPROEX SODIUM 500 MG PO DR TAB: 500.0000 mg | DELAYED_RELEASE_TABLET | Freq: Once | ORAL | Status: DC

## 2019-01-04 MED ORDER — AMLODIPINE BESYLATE 5 MG PO TABS
5.0000 mg | ORAL_TABLET | Freq: Every day | ORAL | Status: DC
  Administered 2019-01-05 – 2019-01-09 (×5): 5 mg via ORAL
  Filled 2019-01-04 (×6): qty 1

## 2019-01-04 MED ORDER — DIVALPROEX SODIUM 500 MG PO DR TAB
500.0000 mg | DELAYED_RELEASE_TABLET | Freq: Two times a day (BID) | ORAL | Status: DC
  Administered 2019-01-04: 17:00:00 500 mg via ORAL
  Filled 2019-01-04: qty 1

## 2019-01-04 NOTE — Progress Notes (Signed)
Patient refused her BP medication from this Probation officer. Patient is preoccupied with "taking my red pill that Dr. Loni Muse in [place] gave me, y'all aren't giving me any of my medicine that I'm supposed to be taking". This Probation officer called to notify MD and was unsuccessful. Will attempt to notify MD again.

## 2019-01-04 NOTE — Plan of Care (Signed)
D- Patient alert and oriented x2, however, she is disoriented to time and place. Patient presents in a pleasant, but preoccupied mood on assessment about "they still haven't given me my red behavior pill". Patient continues to be preoccupied with going home tomorrow and to dialysis "in [facility]". Patient did not endorse any signs/symptoms of depression/anxiety to this Probation officer. Patient has been agitated about not getting her red pill. Patient also denied SI, HI, AVH, and pain at this time. Patient had no stated goals for today.  A- Support and encouragement provided.  Routine safety checks conducted every 15 minutes. Patient informed to notify staff with problems or concerns.  R- No adverse drug reactions noted. Patient contracts for safety at this time. Patient compliant with medications and treatment plan. Patient receptive, calm, and cooperative. Patient interacts well with others on the unit.  Patient remains safe at this time.  Problem: Education: Goal: Knowledge of [facility] Education information/materials will improve Outcome: Not Progressing   Problem: Coping: Goal: Ability to verbalize frustrations and anger appropriately will improve Outcome: Not Progressing Goal: Ability to demonstrate self-control will improve Outcome: Not Progressing   Problem: Safety: Goal: Periods of time without injury will increase Outcome: Not Progressing   Problem: Health Behavior/Discharge Planning: Goal: Compliance with prescribed medication regimen will improve Outcome: Not Progressing   Problem: Self-Care: Goal: Ability to participate in self-care as condition permits will improve Outcome: Not Progressing   Problem: Education: Goal: Ability to state activities that reduce stress will improve Outcome: Not Progressing   Problem: Coping: Goal: Ability to identify and develop effective coping behavior will improve Outcome: Not Progressing   Problem: Self-Concept: Goal: Ability  to identify factors that promote anxiety will improve Outcome: Not Progressing Goal: Level of anxiety will decrease Outcome: Not Progressing Goal: Ability to modify response to factors that promote anxiety will improve Outcome: Not Progressing

## 2019-01-04 NOTE — Plan of Care (Signed)
D: Patient has been less manic. Has been sleeping much better. Medication compliant.Less intrusive. Still talking about Dr. Loni Muse and her "red pills". Pleasant and cooperative.  A: Continue to monitor for safety and offer support. R: Safety maintained.

## 2019-01-04 NOTE — Progress Notes (Signed)
D: Patient has been less manic. Has been sleeping much better. Medication compliant.Less intrusive. Still talking about Dr. Loni Muse and her "red pills". Pleasant and cooperative.  A: Continue to monitor for safety and offer support. R: Safety maintained.

## 2019-01-04 NOTE — Progress Notes (Signed)
This writer felt the thrill and heard the bruit in patient's LAV fistula.

## 2019-01-04 NOTE — BHH Group Notes (Signed)
LCSW Group Therapy Note 01/04/2019 1:15pm  Type of Therapy and Topic: Group Therapy: Feelings Around Returning Home & Establishing a Supportive Framework and Supporting Oneself When Supports Not Available  Participation Level: Did Not Attend  Description of Group:  Patients first processed thoughts and feelings about upcoming discharge. These included fears of upcoming changes, lack of change, new living environments, judgements and expectations from others and overall stigma of mental health issues. The group then discussed the definition of a supportive framework, what that looks and feels like, and how do to discern it from an unhealthy non-supportive network. The group identified different types of supports as well as what to do when your family/friends are less than helpful or unavailable  Therapeutic Goals  1. Patient will identify one healthy supportive network that they can use at discharge. 2. Patient will identify one factor of a supportive framework and how to tell it from an unhealthy network. 3. Patient able to identify one coping skill to use when they do not have positive supports from others. 4. Patient will demonstrate ability to communicate their needs through discussion and/or role plays.  Summary of Patient Progress:    Therapeutic Modalities Cognitive Behavioral Therapy Motivational Interviewing   Madison [name]  CUEBAS-COLON, LCSW 01/04/2019 11:09 AM

## 2019-01-04 NOTE — Progress Notes (Addendum)
[facility] MD Progress Note  01/04/2019 12:49 PM JANAYLA MARIK  MRN:  [id number]    Subjective:  Patient was seen in her room today.  She continues to be hyperverbal with pressured speech and thought processes are disorganized.  At times, she will start mumbling.  She had to be redirected during conversation multiple times.  Thought processes were disorganized.  She she denied any current active or passive suicidal thoughts.  Insight and judgment remain poor.  At times, she was seen talking to herself in the hallway.  She is attending groups on the unit.  The patient also does interact with staff and peers and is eating her meals regularly.  She did sleep approximately 6 hours last night per nursing.  Blood pressure was elevated this morning and she is being followed by nephrology.She does complain of chronic bilateral hip pain and back pain but no other somatic complaints.  She did report arthritic pain stating that she had had bilateral hip pain, back pain and chronic neck pain.  Principal Problem: Bipolar affective disorder, current episode manic with psychotic symptoms ([place]) Diagnosis: Principal Problem:   Bipolar affective disorder, current episode manic with psychotic symptoms ([place]) Active Problems:   End stage renal disease ([place])   Schizoaffective disorder, bipolar type ([place])  Total Time spent with patient: 20 minutes  Past Psychiatric History: Patient has evidently had mental health problems most of her life.  She talks [facility].  She was born with a twin sister who from what the patient is saying also had mental health problems.  Sister died several years ago which is documented as having been a huge stress for her.  Patient denies ever having tried to kill herself in the past.  No report of any violence to others.  Patient could not remember all the medicine she has taken in the past but at one point she started talking  about lithium which makes me think may be that was used at some time in the past but recently it looks like Zyprexa and mirtazapine have been the main stays.  Past Medical History:  Past Medical History:  Diagnosis Date  . Anxiety   . ESRD (end stage renal disease) on dialysis ([place])   . Neuropathy   . Schizophrenia [facility])     Past Surgical History:  Procedure Laterality Date  . CHOLECYSTECTOMY    . COLONOSCOPY N/A 01/20/2016   Dr. Oneida Alar: 12 mm tubular adenoma removed from the transverse colon, 4 hyperplastic polyps removed from the rectum and sigmoid colon.  Next colonoscopy planned for April 2020.  Marland Kitchen ESOPHAGOGASTRODUODENOSCOPY N/A 12/19/2017   Procedure: ESOPHAGOGASTRODUODENOSCOPY (EGD);  Surgeon: [name], MD;  Location: [facility] ENDO SUITE;  Service: Endoscopy;  Laterality: N/A;  8:30am  . fistula left arm    . GIVENS CAPSULE STUDY N/A 01/14/2018   Procedure: GIVENS CAPSULE STUDY;  Surgeon: [name], MD;  Location: [facility] ENDO SUITE;  Service: Endoscopy;  Laterality: N/A;  7:30am  . HEMICOLECTOMY Right   . TUBAL LIGATION     Family History:  Family History  Problem Relation Age of Onset  . Alzheimer's disease Mother   . Cancer Mother        pancreatic  . Prostate cancer Father   . Kidney failure Sister   . Breast cancer Neg Hx   . Colon cancer Neg Hx    Family Psychiatric  History: See previous.  Patient had a  twin sister, now deceased, who also had chronic mental illness Social History:  Social History   Substance and Sexual Activity  Alcohol Use No     Social History   Substance and Sexual Activity  Drug Use No    Social History   Socioeconomic History  . Marital status: Single    Spouse name: Not on file  . Number of children: Not on file  . Years of education: Not on file  . Highest education level: Not on file  Occupational History  . Not on file  Social Needs  . Financial resource strain: Not on file  . Food insecurity:    Worry: Not on file     Inability: Not on file  . Transportation needs:    Medical: Not on file    Non-medical: Not on file  Tobacco Use  . Smoking status: Never Smoker  . Smokeless tobacco: Never Used  Substance and Sexual Activity  . Alcohol use: No  . Drug use: No  . Sexual activity: Yes  Lifestyle  . Physical activity:    Days per week: Not on file    Minutes per session: Not on file  . Stress: Not on file  Relationships  . Social connections:    Talks on phone: Not on file    Gets together: Not on file    Attends religious service: Not on file    Active member of club or organization: Not on file    Attends meetings of clubs or organizations: Not on file    Relationship status: Not on file  Other Topics Concern  . Not on file  Social History Narrative  . Not on file   Additional Social History:                         Sleep: Fair  Appetite:  Fair  Current Medications: Current Facility-Administered Medications  Medication Dose Route Frequency Provider Last Rate Last Dose  . acetaminophen (TYLENOL) tablet 650 mg  650 mg Oral Q6H PRN Clapacs, Madie Reno, MD   650 mg at 01/03/19 2053  . alum & mag hydroxide-simeth (MAALOX/MYLANTA) 200-200-20 MG/5ML suspension 30 mL  30 mL Oral Q4H PRN Lamont Dowdy, NP      . Chlorhexidine Gluconate Cloth 2 % PADS 6 each  6 each Topical Q0600 Murlean Iba, MD      . divalproex (DEPAKOTE) DR tablet 500 mg  500 mg Oral BID Chauncey Mann, MD      . divalproex (DEPAKOTE) DR tablet 500 mg  500 mg Oral Once Chauncey Mann, MD      . Derrill Memo ON 01/05/2019] epoetin alfa (EPOGEN,PROCRIT) injection 10,000 Units  10,000 Units Intravenous Q M,W,F-HD Kolluru, Sarath, MD      . gabapentin (NEURONTIN) capsule 100 mg  100 mg Oral BID Chauncey Mann, MD   100 mg at 01/04/19 0801  . lidocaine-prilocaine (EMLA) cream   Topical Q M,W,F Singh, Harmeet, MD      . lithium carbonate capsule 300 mg  300 mg Oral Once per day on Mon Wed Fri Clapacs, John T, MD   300 mg at  01/02/19 1703  . mirtazapine (REMERON) tablet 30 mg  30 mg Oral QHS Clapacs, Madie Reno, MD   30 mg at 01/03/19 2102  . multivitamin with minerals tablet 1 tablet  1 tablet Oral Daily Clapacs, Madie Reno, MD   1 tablet at 01/04/19 0801  . OLANZapine (ZYPREXA) tablet  20 mg  20 mg Oral QHS Clapacs, Madie Reno, MD   20 mg at 01/03/19 2102  . OLANZapine (ZYPREXA) tablet 5 mg  5 mg Oral Daily Clapacs, John T, MD   5 mg at 01/04/19 0800  . pantoprazole (PROTONIX) EC tablet 40 mg  40 mg Oral Daily Clapacs, Madie Reno, MD   40 mg at 01/04/19 0801  . senna (SENOKOT) tablet 8.6-17.2 mg  1-2 tablet Oral QHS PRN Lamont Dowdy, NP   17.2 mg at 12/29/18 2226  . temazepam (RESTORIL) capsule 15 mg  15 mg Oral QHS Clapacs, Madie Reno, MD   15 mg at 01/03/19 2102  . thiamine (VITAMIN B-1) tablet 50 mg  50 mg Oral Daily Clapacs, Madie Reno, MD   50 mg at 01/04/19 0800    Lab Results:  Results for orders placed or performed during the hospital encounter of 12/25/18 (from the past 48 hour(s))  Renal function panel     Status: Abnormal   Collection Time: 01/03/19  6:41 AM  Result Value Ref Range   Sodium 136 135 - 145 mmol/L   Potassium 5.7 (H) 3.5 - 5.1 mmol/L   Chloride 98 98 - 111 mmol/L   CO2 28 22 - 32 mmol/L   Glucose, Bld 107 (H) 70 - 99 mg/dL   BUN 52 (H) 8 - 23 mg/dL   Creatinine, Ser 5.49 (H) 0.44 - 1.00 mg/dL   Calcium 9.6 8.9 - 10.3 mg/dL   Phosphorus 3.7 2.5 - 4.6 mg/dL   Albumin 3.2 (L) 3.5 - 5.0 g/dL   GFR calc non Af Amer 7 (L) >60 mL/min   GFR calc Af Amer 8 (L) >60 mL/min   Anion gap 10 5 - 15    Comment: Performed at [facility], [address] [place], [place] [address]  CBC     Status: Abnormal   Collection Time: 01/03/19  6:41 AM  Result Value Ref Range   WBC 5.6 4.0 - 10.5 K/uL   RBC 2.66 (L) 3.87 - 5.11 MIL/uL   Hemoglobin 8.2 (L) 12.0 - 15.0 g/dL   HCT 25.6 (L) 36.0 - 46.0 %   MCV 96.2 80.0 - 100.0 fL   MCH 30.8 26.0 - 34.0 pg   MCHC 32.0 30.0 - 36.0 g/dL   RDW 18.2 (H) 11.5 -  15.5 %   Platelets 170 150 - 400 K/uL   nRBC 0.0 0.0 - 0.2 %    Comment: Performed at [facility], [address] [place], [place] [address]  Valproic acid level     Status: Abnormal   Collection Time: 01/04/19  6:31 AM  Result Value Ref Range   Valproic Acid Lvl 37 (L) 50.0 - 100.0 ug/mL    Comment: Performed at [facility], [address], [place], [place] [address]    Blood Alcohol level:  Lab Results  Component Value Date   [facility] <10 01/11/2019   ETH <5 49/70/2637    Metabolic Disorder Labs: No results found for: HGBA1C, MPG No results found for: PROLACTIN No results found for: CHOL, TRIG, HDL, CHOLHDL, VLDL, LDLCALC  Musculoskeletal: Strength & Muscle Tone: within normal limits Gait & Station: slow but steady Patient leans: NA  Psychiatric Specialty Exam: Physical Exam  Nursing note and vitals reviewed. Respiratory: Effort normal. No respiratory distress.    Review of Systems  Constitutional:       Low grade temp this am which resolved to WNL  HENT: Negative.  Negative for  congestion, nosebleeds, sinus pain and sore throat.   Eyes: Negative.   Respiratory: Negative for cough, hemoptysis, sputum production, shortness of breath and wheezing.   Cardiovascular: Negative.   Gastrointestinal: Negative.   Musculoskeletal: Negative.        She has arthritic pain bilaterally in her knees and hips.  She also complains of back pain and neck pain that is chronic.  Skin: Negative.   Neurological: Negative.     Blood pressure (!) 151/82, pulse 97, temperature 98.8 F (37.1 C), temperature source Oral, resp. rate 16, height 5\' 4"  (1.626 m), weight 60.4 kg, SpO2 95 %.Body mass index is 22.86 kg/m.  General Appearance: Casual  Eye Contact: Fair  Speech:  Pressured and rapid  Volume:  Normal  Mood:  "OK"  Affect:  Anxious, can be labile  Thought Process: Disorganized thoughts  Orientation:  Oriented to year and place  Thought Content: illlogical,  paranoid and delusional  Suicidal Thoughts:  Denies suicidal thoughts  Homicidal Thoughts:  Denies homicidal thoughts  Memory:  Immediate;   Poor Recent;   Poor Remote;   Poor  Judgement:  Impaired  Insight:  Poor  Psychomotor Activity:  Increased at times  Concentration:  Concentration: Poor and Attention Span: Poor  Recall:  Poor  Fund of Knowledge:  Fair  Language:  Fair  Akathisia:  No  Handed:  Right  AIMS (if indicated):     Assets:  Desire for Improvement Housing Social Support  ADL's:  Intact  Cognition:  Impaired,  Mild  Sleep:  Number of Hours: 6     Treatment Plan Summary:  Schizoaffective disorder, bipolar type End-stage renal disease Neuropathy   Ms. Graw is a 68 year old female with history of schizoaffective disorder who was admitted to the hospital for psychotropic medication management and stabilization secondary to mania.  The patient developed mania approximately 2 weeks after her mother passed away.  Schizoaffective disorder, bipolar type: Valproic acid level was 37.  Will increase Depakote to a total of 500 mg p.o. twice daily.  We will also give a one-time dose of Depakote 500 mg today. Will need to recheck VPA and LFTs in one weeks time Continue lithium 300 mg p.o. 3 times a week for mood stabilization Continue Zyprexa 5 mg p.o. daily and 20 mg p.o. nightly for mood stabilization consider adding with 5 mg daily at noon Secondary to mania, will discontinue Remeron.  We will leave the patient on temazepam 15 mg p.o. nightly for insomnia.  Hopefully increased dose of Depakote will help her sleep. EKG shows a QTC of 480 Will check hemoglobin A1c and lipid panel  Arthritic pain -We will add Neurontin 100 mg p.o. twice daily for mood stabilization and arthritic pain  End-stage renal disease -Patient will continue on hemodialysis 3 times a week.  Appreciate input from nephrology.  Nephrology is monitoring CBC and BMP.  HTN She is not on  antihypertensives. Will discuss with nephrology as BP has been slightly elevated.  Disposition -Patient has a stable living situation but will psychotropic medication management follow-up appointment       Chauncey Mann, MD 01/04/2019, 12:49 PM

## 2019-01-05 DIAGNOSIS — F312 Bipolar disorder, current episode manic severe with psychotic features: Secondary | ICD-10-CM | POA: Diagnosis not present

## 2019-01-05 DIAGNOSIS — E875 Hyperkalemia: Secondary | ICD-10-CM

## 2019-01-05 LAB — AMMONIA: Ammonia: 22 umol/L (ref 9–35)

## 2019-01-05 LAB — CBC WITH DIFFERENTIAL/PLATELET
Abs Immature Granulocytes: 0.03 10*3/uL (ref 0.00–0.07)
Basophils Absolute: 0 10*3/uL (ref 0.0–0.1)
Basophils Relative: 0 %
Eosinophils Absolute: 0.2 10*3/uL (ref 0.0–0.5)
Eosinophils Relative: 3 %
HCT: 25 % — ABNORMAL LOW (ref 36.0–46.0)
Hemoglobin: 8.1 g/dL — ABNORMAL LOW (ref 12.0–15.0)
Immature Granulocytes: 0 %
Lymphocytes Relative: 13 %
Lymphs Abs: 1 10*3/uL (ref 0.7–4.0)
MCH: 30.8 pg (ref 26.0–34.0)
MCHC: 32.4 g/dL (ref 30.0–36.0)
MCV: 95.1 fL (ref 80.0–100.0)
Monocytes Absolute: 0.7 10*3/uL (ref 0.1–1.0)
Monocytes Relative: 9 %
Neutro Abs: 5.7 10*3/uL (ref 1.7–7.7)
Neutrophils Relative %: 75 %
Platelets: 176 10*3/uL (ref 150–400)
RBC: 2.63 MIL/uL — ABNORMAL LOW (ref 3.87–5.11)
RDW: 17.9 % — ABNORMAL HIGH (ref 11.5–15.5)
WBC: 7.7 10*3/uL (ref 4.0–10.5)
nRBC: 0 % (ref 0.0–0.2)

## 2019-01-05 LAB — COMPREHENSIVE METABOLIC PANEL
ALT: 16 U/L (ref 0–44)
AST: 25 U/L (ref 15–41)
Albumin: 3.1 g/dL — ABNORMAL LOW (ref 3.5–5.0)
Alkaline Phosphatase: 60 U/L (ref 38–126)
Anion gap: 13 (ref 5–15)
BUN: 99 mg/dL — ABNORMAL HIGH (ref 8–23)
CO2: 24 mmol/L (ref 22–32)
Calcium: 9.1 mg/dL (ref 8.9–10.3)
Chloride: 98 mmol/L (ref 98–111)
Creatinine, Ser: 9.19 mg/dL — ABNORMAL HIGH (ref 0.44–1.00)
GFR calc Af Amer: 5 mL/min — ABNORMAL LOW (ref >60)
GFR calc non Af Amer: 4 mL/min — ABNORMAL LOW (ref >60)
Glucose, Bld: 84 mg/dL (ref 70–99)
Potassium: 6.2 mmol/L — ABNORMAL HIGH (ref 3.5–5.1)
Sodium: 135 mmol/L (ref 135–145)
Total Bilirubin: 0.7 mg/dL (ref 0.3–1.2)
Total Protein: 6.4 g/dL — ABNORMAL LOW (ref 6.5–8.1)

## 2019-01-05 LAB — TSH: TSH: 1.54 u[IU]/mL (ref 0.350–4.500)

## 2019-01-05 LAB — LITHIUM LEVEL: Lithium Lvl: 0.23 mmol/L — ABNORMAL LOW (ref 0.60–1.20)

## 2019-01-05 LAB — FOLATE: Folate: 16.2 ng/mL (ref >5.9)

## 2019-01-05 LAB — VITAMIN B12: Vitamin B-12: 945 pg/mL — ABNORMAL HIGH (ref 180–914)

## 2019-01-05 MED ORDER — SODIUM ZIRCONIUM CYCLOSILICATE 10 G PO PACK
10.0000 g | PACK | Freq: Two times a day (BID) | ORAL | Status: AC
  Filled 2019-01-05: qty 1

## 2019-01-05 MED ORDER — DIVALPROEX SODIUM 250 MG PO DR TAB
250.0000 mg | DELAYED_RELEASE_TABLET | Freq: Three times a day (TID) | ORAL | Status: DC
  Administered 2019-01-05 – 2019-01-09 (×9): 250 mg via ORAL
  Filled 2019-01-05 (×11): qty 1

## 2019-01-05 NOTE — Progress Notes (Signed)
Recreation Therapy Notes  Date: 01/05/2019  Time: 9:30 am   Location: Craft room   Behavioral response: N/A   Intervention Topic: Strengths  Discussion/Intervention: Patient did not attend group.   Clinical Observations/Feedback:  Patient did not attend group.   Avia Merkley LRT/CTRS        Josselin Gaulin 01/05/2019 11:00 AM

## 2019-01-05 NOTE — Consult Note (Addendum)
[facility] [place] NAME: Madison [name]    MR#:  [id number]  DATE OF BIRTH:  [birth year]  DATE OF ADMISSION:  12/25/2018  PRIMARY CARE PHYSICIAN: Lucianne Lei, MD   REQUESTING/REFERRING PHYSICIAN: Dr. Nicolasa Ducking  CHIEF COMPLAINT:  Altered mental status  HISTORY OF PRESENT ILLNESS:  Madison [name]  is a 68 y.o. female with a known history of bipolar disorder with psychotic symptoms and ESRD on HD who has been hospitalized on the behavioral health unit since 4/2.  Last night, she developed progressively worsening confusion.  Depakote level was recently increased from 250 mg twice daily to 500 mg twice daily.  Patient states that she is not confused and that there is nothing wrong with her.  She states she would like to go home.  She denies any chest pain, shortness of breath, abdominal pain, leg pain.  PAST MEDICAL HISTORY:   Past Medical History:  Diagnosis Date  . Anxiety   . ESRD (end stage renal disease) on dialysis ([place])   . Neuropathy   . Schizophrenia ([place])     PAST SURGICAL HISTOIRY:   Past Surgical History:  Procedure Laterality Date  . CHOLECYSTECTOMY    . COLONOSCOPY N/A 01/20/2016   Dr. Oneida Alar: 12 mm tubular adenoma removed from the transverse colon, 4 hyperplastic polyps removed from the rectum and sigmoid colon.  Next colonoscopy planned for April 2020.  Marland Kitchen ESOPHAGOGASTRODUODENOSCOPY N/A 12/19/2017   Procedure: ESOPHAGOGASTRODUODENOSCOPY (EGD);  Surgeon: [name], MD;  Location: [facility];  Service: Endoscopy;  Laterality: N/A;  8:30am  . fistula left arm    . GIVENS CAPSULE STUDY N/A 01/14/2018   Procedure: GIVENS CAPSULE STUDY;  Surgeon: [name], MD;  Location: [facility];  Service: Endoscopy;  Laterality: N/A;  7:30am  . HEMICOLECTOMY Right   . TUBAL LIGATION      SOCIAL HISTORY:   Social History   Tobacco Use  . Smoking status: Never Smoker  . Smokeless tobacco: Never Used  Substance Use Topics   . Alcohol use: No    FAMILY HISTORY:   Family History  Problem Relation Age of Onset  . Alzheimer's disease Mother   . Cancer Mother        pancreatic  . Prostate cancer Father   . Kidney failure Sister   . Breast cancer Neg Hx   . Colon cancer Neg Hx     DRUG ALLERGIES:  No Known Allergies  REVIEW OF SYSTEMS:  Unable to accurately obtain, although patient does deny some symptoms such as chest pain, shortness of breath, abdominal pain, leg pain.  MEDICATIONS AT HOME:   Prior to Admission medications   Medication Sig Start Date End Date Taking? Authorizing Provider  ferric citrate (AURYXIA) 1 GM 210 MG(Fe) tablet Take 210 mg by mouth 3 (three) times daily with meals.     [provider]  gabapentin (NEURONTIN) 300 MG capsule Take 300 mg by mouth at bedtime.     [provider]  metroNIDAZOLE (FLAGYL) 500 MG tablet Take 1 tablet (500 mg total) by mouth 2 (two) times daily. 12/02/18   Florian Buff, MD  mirtazapine (REMERON) 30 MG tablet Take 30 mg by mouth at bedtime.     [provider]  Multiple Vitamin (MULTIVITAMIN) tablet Take 1 tablet by mouth daily.    [provider]  OLANZapine (ZYPREXA) 15 MG tablet Take 15 mg by mouth at bedtime.  [provider]  pantoprazole (PROTONIX) 40 MG tablet TAKE 1 TABLET BY MOUTH TWICE DAILY 30 MINUTES BEFORE MEALS FOR 3 MONTHS THEN DECREASE TO 1 TABLET DAILY. 10/02/18   Carlis Stable, NP  thiamine (VITAMIN B-1) 100 MG tablet Take 50 mg by mouth daily.     [provider]      VITAL SIGNS:  Blood pressure (!) 157/73, pulse 96, temperature 98.8 F (37.1 C), temperature source Axillary, resp. rate 16, height 5\' 4"  (1.626 m), weight 60.4 kg, SpO2 97 %.  PHYSICAL EXAMINATION:  GENERAL:  68 y.o.-year-old patient sitting on the edge of her bed.  Talking but not really making sense. EYES: Pupils equal, round, reactive to light and accommodation. No scleral icterus. Extraocular muscles  intact.  HEENT: Head atraumatic, normocephalic. Oropharynx and nasopharynx clear.  NECK:  Supple, no jugular venous distention. No thyroid enlargement, no tenderness.  LUNGS: Normal breath sounds bilaterally, no wheezing, rales,rhonchi or crepitation. No use of accessory muscles of respiration.  CARDIOVASCULAR: S1, S2 normal. No murmurs, rubs, or gallops.  ABDOMEN: Soft, nontender, nondistended. Bowel sounds present. No organomegaly or mass.  EXTREMITIES: No pedal edema, cyanosis, or clubbing.  NEUROLOGIC: Cranial nerves II through XII are intact. Gait not checked. + Tremors.  Moving all extremities. PSYCHIATRIC: The patient is alert.  Appears mildly anxious. SKIN: No obvious rash, lesion, or ulcer.  LABORATORY PANEL:   CBC Recent Labs  Lab 01/05/19 0707  WBC 7.7  HGB 8.1*  HCT 25.0*  PLT 176   ------------------------------------------------------------------------------------------------------------------  Chemistries  Recent Labs  Lab 01/05/19 0707  NA 135  K 6.2*  CL 98  CO2 24  GLUCOSE 84  BUN 99*  CREATININE 9.19*  CALCIUM 9.1  AST 25  ALT 16  ALKPHOS 60  BILITOT 0.7   ------------------------------------------------------------------------------------------------------------------  Cardiac Enzymes No results for input(s): TROPONINI in the last 168 hours. ------------------------------------------------------------------------------------------------------------------  RADIOLOGY:  No results found.  EKG:   Orders placed or performed during the hospital encounter of 12/25/18  . EKG 12-Lead  . EKG 12-Lead  . EKG 12-Lead  . EKG 12-Lead  . EKG 12-Lead  . EKG 12-Lead    IMPRESSION AND PLAN:   Altered mental status-may be secondary to recently increased Depakote dose versus uremia versus other process. -Agree with decreasing Depakote dose back to 250 -Agree with checking LFTs, ammonia,  UA -Will also check TSH, HIV, RPR, vitamin B12, folate -Will  consider CT head if she continues to be altered.  Do not think patient would tolerate this today.  ESRD on dialysis MWF -Nephrology following -Plan for dialysis today  Bipolar disorder with psychotic symptoms -Management per psychiatry  All the records are reviewed. Management plans discussed with the patient, family and they are in agreement.  CODE STATUS: Full  TOTAL TIME TAKING CARE OF THIS PATIENT: 45 minutes.    Berna Spare Alvon Nygaard M.D on 01/05/2019 at 4:30 PM  Between 7am to 6pm - Pager [phone]  After 6pm go to [email or web address] - Proofreader  [facility]  Office  [phone]  CC: Primary care Physician: Lucianne Lei, MD   Note: This dictation was prepared with Dragon dictation along with smaller phrase technology. Any transcriptional errors that result from this process are unintentional.

## 2019-01-05 NOTE — Progress Notes (Signed)
11am-3pm D: Patient remains to have hand tremors . Seen by Dr. Holley Raring whom stated she will have  Dialysis this afternoon . Patient was also seem by Dr. Brett Albino as consultation . Patient refused 12 noon Depakote . Informed Dr. Weber Cooks of refusal Patient noted to have a non productive cough. Voice of legs being  Very sore  Crys out in pain  With movement  Patient left for dialysis with staff at 14:15 PM A: Remained to have Altered Mental Status . Lab work ordered. R: Continued 1:1

## 2019-01-05 NOTE — Progress Notes (Signed)
HD Tx End   01/05/19 1745  Vital Signs  Pulse Rate 97  Resp 18  BP (!) 152/72  Oxygen Therapy  SpO2 100 %  Pain Assessment  Pain Scale 0-10  Pain Score 0  Dialysis Weight  Weight  (unable to weigh,pt in chair from [facility])  During Hemodialysis Assessment  Blood Flow Rate (mL/min) 400 mL/min  Arterial Pressure (mmHg) -190 mmHg  Venous Pressure (mmHg) 200 mmHg  Transmembrane Pressure (mmHg) 70 mmHg  Ultrafiltration Rate (mL/min) 780 mL/min  Dialysate Flow Rate (mL/min) 600 ml/min  Conductivity: Machine  14  HD Safety Checks Performed Yes  Dialysis Fluid Bolus Normal Saline  Bolus Amount (mL) 250 mL  Intra-Hemodialysis Comments Tx completed

## 2019-01-05 NOTE — Progress Notes (Signed)
D: Patient was mumbling and more confused this morning. Having a hard time getting out of bed. Complaining of pain in her hips and knees but refusing pain medication. Having difficulty moving around but will not be still. Leaning to the side while sitting in her chair. O2 sat was initially 92% on room air. Rechecked was 94%. Dr. Nicolasa Ducking notified and stated that she is placing patient on 1:1. A: Continue to monitor for safety. R: Safety maintained.

## 2019-01-05 NOTE — Progress Notes (Signed)
D: Patient remains somewhat intrusive, attempting to enter nurses' station at times, although less than upon initial admit. Continues to be hyperverbal, disorganized, with flight of ideas--conversation consists of same repetitive themes. She has complained of joint stiffness and pain, but refuses pain medication. States "my doctor wants me off that medication". Showed me a paper with "metoprolol" written on it--patient may be confusing that with tylenol. Still refusing pain medication and appears tired after hs medications, but is fighting drowsiness. Patient sat on floor in her room and spread contents of her folder all around her on the floor, obsessing over "some papers". Redirected to get back in bed several times, and then complained of pain and stiffness when she tried to get up and required assistance to her feet and into bed. Patient had been earlier observed bending down and touching  her toes, but has been observed moving more stiffly than usual and complaining of pain in her hips and knees. Continues to obsess over getting her red vitamin pill.  A: Continue to monitor for safety and offer support. R: Safety maintained.

## 2019-01-05 NOTE — Progress Notes (Signed)
3pm -7pm  D: Patient remained in diaylsis with 1:1 Returned to unit at 1755 Blood pressure 128/40 patient was uncontrolled jerking  With that arm  Prior blood pressure before return to unit 148/69   informed MD Clapacs  Patient refused her evening medication

## 2019-01-05 NOTE — Progress Notes (Signed)
Post HD Assessment  Removed 1.5kg, tolerated tx well. More alert and engaged after dialysis. Speech less garbled.    01/05/19 1758  Neurological  Level of Consciousness Alert  Orientation Level Oriented to person;Oriented to place;Disoriented to time;Disoriented to situation  Respiratory  Respiratory Pattern Regular  Chest Assessment Chest expansion symmetrical  Bilateral Breath Sounds Clear  Cough Non-productive  Cardiac  Pulse Regular  Heart Sounds S1, S2  ECG Monitor Yes  Cardiac Rhythm NSR  Ectopy Couplet PVC's  Ectopy Frequency Rare  Vascular  R Dorsalis Pedis Pulse +2  L Dorsalis Pedis Pulse +2  Integumentary  Integumentary (WDL) X  Skin Color Appropriate for ethnicity  Musculoskeletal  Musculoskeletal (WDL) X  Generalized Weakness Yes  Gastrointestinal  Bowel Sounds Assessment Active  GU Assessment  Genitourinary (WDL) X  Genitourinary Symptoms  (HD pt - oliguria )  Psychosocial  Psychosocial (WDL) X  Patient Behaviors  (Still weak/drowsy, speaking less garbled.more coherent)  Emotional support given Given to patient

## 2019-01-05 NOTE — Progress Notes (Signed)
Post HD Tx  Removed 1.5kg, tolerated tx well. More alert and engaged after dialysis. Speech less garbled.    01/05/19 1750  Hand-Off documentation  Report given to (Full Name) Polly Cobia RN  Report received from (Full Name) Trellis Paganini RN  Vital Signs  Temp 98 F (36.7 C)  Temp Source Axillary  Pulse Rate 97  Pulse Rate Source Monitor  Resp 20  BP (!) 148/69  BP Location Right Arm  BP Method Automatic  Patient Position (if appropriate) Sitting  Oxygen Therapy  SpO2 99 %  O2 Device Room Air  Pain Assessment  Pain Scale 0-10  Pain Score 0  Post-Hemodialysis Assessment  Rinseback Volume (mL) 250 mL  KECN 72 V  Dialyzer Clearance Lightly streaked  Duration of HD Treatment -hour(s) 3 hour(s)  Hemodialysis Intake (mL) 500 mL  UF Total -Machine (mL) 2000 mL  Net UF (mL) 1500 mL  Tolerated HD Treatment Yes  AVG/AVF Arterial Site Held (minutes) 5 minutes  AVG/AVF Venous Site Held (minutes) 5 minutes  Note  Observations Deaccessed AVF, hemostasis reached

## 2019-01-05 NOTE — Progress Notes (Signed)
7am-11am D: Patient disorganized this am  Noted to have moderate hand tremors   . Periods of time during  Conversation,  sentence  Structure turns into mumbling. Posture noted bent over instead of standing straight  As she had been . Appetite good at breakfast  Patient seen by Dr. Holley Raring  This am at 10:55   A: Altered mental status R: 1:1 remained  With patient  For continual monitoring

## 2019-01-05 NOTE — Progress Notes (Signed)
Pre HD Tx   01/05/19 1430  Hand-Off documentation  Report given to (Full Name) Trellis Paganini RN  Vital Signs  Temp 98.8 F (37.1 C)  Temp Source Axillary  Pulse Rate 92  Pulse Rate Source Monitor  Resp (!) 22  BP 125/66  BP Location Right Arm  BP Method Automatic  Patient Position (if appropriate) Sitting  Oxygen Therapy  SpO2 97 %  O2 Device Room Air  Pain Assessment  Pain Scale 0-10  Pain Score 0  Dialysis Weight  Weight  (unable to weigh d/t chair )  Time-Out for Hemodialysis  What Procedure? Hemodialysis   Pt Identifiers(min of two) First/Last Name;MRN/Account#  Correct Site? Yes  Correct Side? Yes  Correct Procedure? Yes  Consents Verified? Yes  Rad Studies Available? N/A  Safety Precautions Reviewed? Yes  Engineer, civil (consulting) Number 4  Station Number 4  UF/Alarm Test Passed  Conductivity: Meter 14  Conductivity: Machine  14  pH 7.2  Reverse Osmosis Main  Normal Saline Lot Number [id number]  Dialyzer Lot Number [id number]  Disposable Set Lot Number [id number]  Machine Temperature 98.6 F (37 C)  Musician and Audible Yes  Blood Lines Intact and Secured Yes  Pre Treatment Patient Checks  Vascular access used during treatment Fistula  Patient is receiving dialysis in a chair Yes  Hepatitis B Surface Antigen Results Negative  Date Hepatitis B Surface Antigen Drawn 12/29/18  Hepatitis B Surface Antibody  (<10)  Date Hepatitis B Surface Antibody Drawn 12/29/18  Hemodialysis Consent Verified Yes  Hemodialysis Standing Orders Initiated Yes  ECG (Telemetry) Monitor On Yes  Prime Ordered Normal Saline  Length of  DialysisTreatment -hour(s) 3 Hour(s)  Dialysis Treatment Comments Na 140  Dialyzer Elisio 17H NR  Dialysate 2K, 2.5 Ca  Dialysate Flow Ordered 600  Blood Flow Rate Ordered 400 mL/min  Ultrafiltration Goal 1.5 Liters  Dialysis Blood Pressure Support Ordered Normal Saline  Education / Care Plan  Dialysis Education Provided  (Unable to educate  in pt's current state)  Note  Observations +B/T left arm AVF , accessed with 15G needles

## 2019-01-05 NOTE — Progress Notes (Signed)
CSW spoke with pts Madison [name], Madison [name] to inform her of pts appointment change to 01/20/2019 at 10:15AM. [name] inquired about whether or not pt gave permission for CSW to speak with any of her sisters. CSW informed [name] that pt declined for CSW to speak with Lemma and reported she could speak with Lelan Pons, but was unable to find the number.  Evalina Field, MSW, LCSW Clinical Social Work 01/05/2019 1:47 PM

## 2019-01-05 NOTE — Progress Notes (Signed)
[facility]  ROUNDING NOTE   Subjective:  Patient much more awake and alert today. Resting comfortably at the moment. Completed dialysis treatment yesterday.   Objective:  Vital signs in last 24 hours:  Temp:  [99.2 F (37.3 C)] 99.2 F (37.3 C) (04/13 0630) Pulse Rate:  [93-110] 110 (04/13 0630) Resp:  [18-20] 20 (04/13 0630) BP: (139-154)/(75-98) 139/85 (04/13 0630) SpO2:  [92 %-100 %] 92 % (04/13 0630)  Weight change:  Filed Weights    Intake/Output: No intake/output data recorded.   Intake/Output this shift:  No intake/output data recorded.  Physical Exam: General: NAD  Head: Normocephalic, atraumatic. Moist oral mucosal membranes  Eyes: Anicteric  Neck: Supple, trachea midline  Lungs:  Clear to auscultation  Heart: Regular rate and rhythm  Abdomen:  Soft, nontender,   Extremities: no peripheral edema.  Neurologic: Awake, alert  Skin: No lesions  Access: Left AVG    Basic Metabolic Panel: Recent Labs  Lab 01/01/19 0653 01/03/19 0641 01/05/19 0707  NA 139 136 135  K 5.7* 5.7* 6.2*  CL 98 98 98  CO2 29 28 24   GLUCOSE 86 107* 84  BUN 48* 52* 99*  CREATININE 5.58* 5.49* 9.19*  CALCIUM 9.4 9.6 9.1  PHOS 3.4 3.7  --     Liver Function Tests: Recent Labs  Lab 01/01/19 0653 01/03/19 0641 01/05/19 0707  AST  --   --  25  ALT  --   --  16  ALKPHOS  --   --  60  BILITOT  --   --  0.7  PROT  --   --  6.4*  ALBUMIN 3.3* 3.2* 3.1*   No results for input(s): LIPASE, AMYLASE in the last 168 hours. Recent Labs  Lab 01/05/19 0707  AMMONIA 22    CBC: Recent Labs  Lab 01/01/19 0653 01/03/19 0641 01/05/19 0707  WBC 4.9 5.6 7.7  NEUTROABS  --   --  5.7  HGB 9.4* 8.2* 8.1*  HCT 29.4* 25.6* 25.0*  MCV 95.1 96.2 95.1  PLT 189 170 176    Cardiac Enzymes: No results for input(s): CKTOTAL, CKMB, CKMBINDEX, TROPONINI in the last 168 hours.  BNP: Invalid input(s): POCBNP  CBG: No results for input(s): GLUCAP in the last 168  hours.  Microbiology: Results for orders placed or performed during the hospital encounter of 08/15/17  MRSA PCR Screening     Status: Abnormal   Collection Time: 08/16/17  8:28 PM  Result Value Ref Range Status   MRSA by PCR POSITIVE (A) NEGATIVE Final    Comment:        The GeneXpert MRSA Assay (FDA approved for NASAL specimens only), is one component of a comprehensive MRSA colonization surveillance program. It is not intended to diagnose MRSA infection nor to guide or monitor treatment for MRSA infections. RESULT CALLED TO, READ BACK BY AND VERIFIED WITH: CRYSTAL BASS AT 2146 08/16/17.PMH     Coagulation Studies: No results for input(s): LABPROT, INR in the last 72 hours.  Urinalysis: No results for input(s): COLORURINE, LABSPEC, PHURINE, GLUCOSEU, HGBUR, BILIRUBINUR, KETONESUR, PROTEINUR, UROBILINOGEN, NITRITE, LEUKOCYTESUR in the last 72 hours.  Invalid input(s): APPERANCEUR    Imaging: No results found.   Medications:    . amLODipine  5 mg Oral Daily  . Chlorhexidine Gluconate Cloth  6 each Topical Q0600  . divalproex  250 mg Oral TID  . epoetin (EPOGEN/PROCRIT) injection  10,000 Units Intravenous Q M,W,F-HD  . gabapentin  100 mg Oral BID  .  lidocaine-prilocaine   Topical Q M,W,F  . multivitamin with minerals  1 tablet Oral Daily  . OLANZapine  20 mg Oral QHS  . OLANZapine  5 mg Oral Daily  . pantoprazole  40 mg Oral Daily  . sodium zirconium cyclosilicate  10 g Oral BID  . temazepam  15 mg Oral QHS  . thiamine  50 mg Oral Daily   acetaminophen, alum & mag hydroxide-simeth, senna  Assessment/ Plan:  Madison [name] is a 68 y.o. black female with end-stage renal disease, bipolar disorder, hypertension was admitted on 12/25/2018 with manic symptoms.   MWF [facility]   1.  End-stage renal disease: Patient due for hemodialysis treatment today.  Orders have been prepared.  2.  Anemia of chronic kidney disease: Start the  patient on Epogen 10,000 units IV daily.  3.  Secondary hyperparathyroidism. - Currently off her [place].  Continue to monitor serum phosphorus periodically.  4. Hypertension: Blood pressure currently 139/85.  Continue amlodipine 5 mg p.o. daily.  5.  Hyperkalemia.  Serum potassium 6.2.  Dialyzed against a 2K bath.   LOS: 11 Madison [name] 4/13/202012:32 PM

## 2019-01-05 NOTE — [facility] Group Notes (Signed)
LCSW Group Therapy Note   01/05/2019 1:00 PM  Type of Therapy and Topic:  Group Therapy:  Overcoming Obstacles   Participation Level:  Did Not Attend   Description of Group:    In this group patients will be encouraged to explore what they see as obstacles to their own wellness and recovery. They will be guided to discuss their thoughts, feelings, and behaviors related to these obstacles. The group will process together ways to cope with barriers, with attention given to specific choices patients can make. Each patient will be challenged to identify changes they are motivated to make in order to overcome their obstacles. This group will be process-oriented, with patients participating in exploration of their own experiences as well as giving and receiving support and challenge from other group members.   Therapeutic Goals: 1. Patient will identify personal and current obstacles as they relate to admission. 2. Patient will identify barriers that currently interfere with their wellness or overcoming obstacles.  3. Patient will identify feelings, thought process and behaviors related to these barriers. 4. Patient will identify two changes they are willing to make to overcome these obstacles:      Summary of Patient Progress X   Therapeutic Modalities:   Cognitive Behavioral Therapy Solution Focused Therapy Motivational Interviewing Relapse Prevention Therapy  Assunta Curtis, MSW, LCSW 01/05/2019 2:55 PM

## 2019-01-05 NOTE — Plan of Care (Signed)
Mental status  change  Patient  unable to process information received 1:1 with patient .  Non compliant with medication . No participation with unit programing.  Unable to inform staff of anxiety needs  or  work on coping  issues   Problem: Coping: Goal: Ability to verbalize frustrations and anger appropriately will improve Outcome: Progressing Goal: Ability to demonstrate self-control will improve Outcome: Progressing   Problem: Safety: Goal: Periods of time without injury will increase Outcome: Progressing   Problem: Health Behavior/Discharge Planning: Goal: Compliance with prescribed medication regimen will improve Outcome: Progressing

## 2019-01-05 NOTE — Tx Team (Signed)
Interdisciplinary Treatment and Diagnostic Plan Update  01/05/2019 Time of Session: 830AM Madison [name] MRN: [id number]  Principal Diagnosis: Bipolar affective disorder, current episode manic with psychotic symptoms ([place])  Secondary Diagnoses: Principal Problem:   Bipolar affective disorder, current episode manic with psychotic symptoms ([name]) Active Problems:   End stage renal disease ([name])   Schizoaffective disorder, bipolar type ([name])   Current Medications:  Current Facility-Administered Medications  Medication Dose Route Frequency Provider Last Rate Last Dose  . acetaminophen (TYLENOL) tablet 650 mg  650 mg Oral Q6H PRN Clapacs, Madison Reno, MD   650 mg at 01/03/19 2053  . alum & mag hydroxide-simeth (MAALOX/MYLANTA) 200-200-20 MG/5ML suspension 30 mL  30 mL Oral Q4H PRN Lamont Dowdy, NP      . amLODipine (NORVASC) tablet 5 mg  5 mg Oral Daily Kolluru, Sarath, MD   5 mg at 01/05/19 0828  . Chlorhexidine Gluconate Cloth 2 % PADS 6 each  6 each Topical Q0600 Murlean Iba, MD   6 each at 01/05/19 0603  . divalproex (DEPAKOTE) DR tablet 250 mg  250 mg Oral TID Chauncey Mann, MD   250 mg at 01/05/19 0826  . epoetin alfa (EPOGEN,PROCRIT) injection 10,000 Units  10,000 Units Intravenous Q M,W,F-HD Kolluru, Sarath, MD      . gabapentin (NEURONTIN) capsule 100 mg  100 mg Oral BID Chauncey Mann, MD   100 mg at 01/05/19 2620  . lidocaine-prilocaine (EMLA) cream   Topical Q M,W,F Murlean Iba, MD      . multivitamin with minerals tablet 1 tablet  1 tablet Oral Daily Clapacs, Madison Reno, MD   1 tablet at 01/05/19 0827  . OLANZapine (ZYPREXA) tablet 20 mg  20 mg Oral QHS Clapacs, Madison Reno, MD   20 mg at 01/04/19 2104  . OLANZapine (ZYPREXA) tablet 5 mg  5 mg Oral Daily Clapacs, Madison Reno, MD   5 mg at 01/05/19 0826  . pantoprazole (PROTONIX) EC tablet 40 mg  40 mg Oral Daily Clapacs, Madison Reno, MD   40 mg at 01/05/19 0826  . senna (SENOKOT) tablet 8.6-17.2 mg  1-2 tablet Oral QHS PRN Lamont Dowdy, NP   17.2 mg at 12/29/18 2226  . sodium zirconium cyclosilicate (LOKELMA) packet 10 g  10 g Oral BID Lateef, Munsoor, MD      . temazepam (RESTORIL) capsule 15 mg  15 mg Oral QHS Clapacs, Madison Reno, MD   15 mg at 01/04/19 2104  . thiamine (VITAMIN B-1) tablet 50 mg  50 mg Oral Daily Clapacs, Madison Reno, MD   50 mg at 01/05/19 0827   PTA Medications: Medications Prior to Admission  Medication Sig Dispense Refill Last Dose  . ferric citrate (AURYXIA) 1 GM 210 MG(Fe) tablet Take 210 mg by mouth 3 (three) times daily with meals.    Taking  . gabapentin (NEURONTIN) 300 MG capsule Take 300 mg by mouth at bedtime.    Taking  . metroNIDAZOLE (FLAGYL) 500 MG tablet Take 1 tablet (500 mg total) by mouth 2 (two) times daily. 14 tablet 1   . mirtazapine (REMERON) 30 MG tablet Take 30 mg by mouth at bedtime.    Taking  . Multiple Vitamin (MULTIVITAMIN) tablet Take 1 tablet by mouth daily.   Taking  . OLANZapine (ZYPREXA) 15 MG tablet Take 15 mg by mouth at bedtime.    Taking  . pantoprazole (PROTONIX) 40 MG tablet TAKE 1 TABLET BY MOUTH TWICE DAILY 30 MINUTES BEFORE MEALS FOR 3  MONTHS THEN DECREASE TO 1 TABLET DAILY. 56 tablet 3 Taking  . thiamine (VITAMIN B-1) 100 MG tablet Take 50 mg by mouth daily.    Taking    Patient Stressors: Health problems Medication change or noncompliance  Patient Strengths: Active sense of humor Supportive family/friends  Treatment Modalities: Medication Management, Group therapy, Case management,  1 to 1 session with clinician, Psychoeducation, Recreational therapy.   Physician Treatment Plan for Primary Diagnosis: Bipolar affective disorder, current episode manic with psychotic symptoms ([place]) Long Term Goal(s): Improvement in symptoms so as ready for discharge Improvement in symptoms so as ready for discharge   Short Term Goals: Ability to verbalize feelings will improve Ability to demonstrate self-control will improve Ability to identify and develop effective  coping behaviors will improve Ability to maintain clinical measurements within normal limits will improve Compliance with prescribed medications will improve  Medication Management: Evaluate patient's response, side effects, and tolerance of medication regimen.  Therapeutic Interventions: 1 to 1 sessions, Unit Group sessions and Medication administration.  Evaluation of Outcomes: Not Met  Physician Treatment Plan for Secondary Diagnosis: Principal Problem:   Bipolar affective disorder, current episode manic with psychotic symptoms ([place]) Active Problems:   End stage renal disease ([place])   Schizoaffective disorder, bipolar type ([place])  Long Term Goal(s): Improvement in symptoms so as ready for discharge Improvement in symptoms so as ready for discharge   Short Term Goals: Ability to verbalize feelings will improve Ability to demonstrate self-control will improve Ability to identify and develop effective coping behaviors will improve Ability to maintain clinical measurements within normal limits will improve Compliance with prescribed medications will improve     Medication Management: Evaluate patient's response, side effects, and tolerance of medication regimen.  Therapeutic Interventions: 1 to 1 sessions, Unit Group sessions and Medication administration.  Evaluation of Outcomes: Not Met   RN Treatment Plan for Primary Diagnosis: Bipolar affective disorder, current episode manic with psychotic symptoms ([name]) Long Term Goal(s): Knowledge of disease and therapeutic regimen to maintain health will improve  Short Term Goals: Ability to verbalize frustration and anger appropriately will improve and Ability to participate in decision making will improve  Medication Management: RN will administer medications as ordered by provider, will assess and evaluate patient's response and provide education to patient for prescribed medication. RN will report any adverse and/or side effects to  prescribing provider.  Therapeutic Interventions: 1 on 1 counseling sessions, Psychoeducation, Medication administration, Evaluate responses to treatment, Monitor vital signs and CBGs as ordered, Perform/monitor CIWA, COWS, AIMS and Fall Risk screenings as ordered, Perform wound care treatments as ordered.  Evaluation of Outcomes: Not Met   LCSW Treatment Plan for Primary Diagnosis: Bipolar affective disorder, current episode manic with psychotic symptoms ([place]) Long Term Goal(s): Safe transition to appropriate next level of care at discharge, Engage patient in therapeutic group addressing interpersonal concerns.  Short Term Goals: Engage patient in aftercare planning with referrals and resources, Facilitate acceptance of mental health diagnosis and concerns, Identify triggers associated with mental health/substance abuse issues and Increase skills for wellness and recovery  Therapeutic Interventions: Assess for all discharge needs, 1 to 1 time with Social worker, Explore available resources and support systems, Assess for adequacy in community support network, Educate family and significant other(s) on suicide prevention, Complete Psychosocial Assessment, Interpersonal group therapy.  Evaluation of Outcomes: Not Met   Progress in Treatment: Attending groups: Yes. Participating in groups: Yes. Taking medication as prescribed: Yes. Toleration medication: Yes. Family/Significant other contact made: Yes, individual(s)  contacted:  Pts cousin Patient understands diagnosis: Yes. Discussing patient identified problems/goals with staff: Yes. Medical problems stabilized or resolved: Yes. Denies suicidal/homicidal ideation: Yes. Issues/concerns per patient self-inventory: No. Other: N/A  New problem(s) identified: Yes, Describe:  Pt is currently on a 1:1 due to fall risk.  New Short Term/Long Term Goal(s): medication management for mood stabilization;  development of comprehensive mental  wellness/sobriety plan.   Patient Goals:  "going home"  Discharge Plan or Barriers: SPE pamphlet, Mobile Crisis information, and AA/NA information provided to patient for additional community support and resources. Pt has an appointment with her psychiatrist Dr. Darleene Cleaver on 01/20/2019 at 10:15 AM.  Reason for Continuation of Hospitalization: Medication stabilization  Estimated Length of Stay: 5-7 days  Recreational Therapy: Patient Stressors: N/A Patient Goal: Patient will focus on task/topic with 2 prompts from staff within 5 recreation therapy group sessions  Attendees: Patient: Madison [name] 01/05/2019 11:19 AM  Physician: 01/05/2019 11:19 AM  Nursing:  01/05/2019 11:19 AM  RN Care Manager: 01/05/2019 11:19 AM  Social Worker: Minette Brine Abrey [place] LCSW 01/05/2019 11:19 AM  Recreational Therapist:  01/05/2019 11:19 AM  Other:  01/05/2019 11:19 AM  Other:  01/05/2019 11:19 AM  Other: 01/05/2019 11:19 AM    Scribe for Treatment Team: Mariann Laster Balthazar Dooly, LCSW 01/05/2019 11:19 AM

## 2019-01-05 NOTE — Progress Notes (Signed)
[facility] MD Progress Note  01/05/2019 5:11 PM JESSYCA SLOAN  MRN:  [id number] Subjective: Patient seen chart reviewed.  Patient this morning was trembling and was even more altered than previously.  Difficult to communicate with.  Slurred speech.  This was apparently fairly new onset.  Work-up was done and medicine consult was obtained.  Lithium level rather than being high was actually low, Depakote level was low although the free Depakote level is still pending, EKG unremarkable.  She has significantly increased her potassium today and her BUN and creatinine were also significantly higher than what they had been last time they were checked.  Patient had not had a fall.  Did not have any clear localizing signs to suggest an obvious stroke. Principal Problem: Bipolar affective disorder, current episode manic with psychotic symptoms ([place]) Diagnosis: Principal Problem:   Bipolar affective disorder, current episode manic with psychotic symptoms ([place]) Active Problems:   End stage renal disease ([place])   Schizoaffective disorder, bipolar type ([place])  Total Time spent with patient: 30 minutes  Past Psychiatric History: Patient has a history of longstanding bipolar disorder  Past Medical History:  Past Medical History:  Diagnosis Date  . Anxiety   . ESRD (end stage renal disease) on dialysis ([place])   . Neuropathy   . Schizophrenia [facility])     Past Surgical History:  Procedure Laterality Date  . CHOLECYSTECTOMY    . COLONOSCOPY N/A 01/20/2016   Dr. Oneida Alar: 12 mm tubular adenoma removed from the transverse colon, 4 hyperplastic polyps removed from the rectum and sigmoid colon.  Next colonoscopy planned for April 2020.  Marland Kitchen ESOPHAGOGASTRODUODENOSCOPY N/A 12/19/2017   Procedure: ESOPHAGOGASTRODUODENOSCOPY (EGD);  Surgeon: [name], MD;  Location: [facility];  Service: Endoscopy;  Laterality: N/A;  8:30am  . fistula left arm    . GIVENS CAPSULE STUDY N/A 01/14/2018   Procedure: GIVENS CAPSULE STUDY;   Surgeon: [name], MD;  Location: [facility];  Service: Endoscopy;  Laterality: N/A;  7:30am  . HEMICOLECTOMY Right   . TUBAL LIGATION     Family History:  Family History  Problem Relation Age of Onset  . Alzheimer's disease Mother   . Cancer Mother        pancreatic  . Prostate cancer Father   . Kidney failure Sister   . Breast cancer Neg Hx   . Colon cancer Neg Hx    Family Psychiatric  History: See previous Social History:  Social History   Substance and Sexual Activity  Alcohol Use No     Social History   Substance and Sexual Activity  Drug Use No    Social History   Socioeconomic History  . Marital status: Single    Spouse name: Not on file  . Number of children: Not on file  . Years of education: Not on file  . Highest education level: Not on file  Occupational History  . Not on file  Social Needs  . Financial resource strain: Not on file  . Food insecurity:    Worry: Not on file    Inability: Not on file  . Transportation needs:    Medical: Not on file    Non-medical: Not on file  Tobacco Use  . Smoking status: Never Smoker  . Smokeless tobacco: Never Used  Substance and Sexual Activity  . Alcohol use: No  . Drug use: No  . Sexual activity: Yes  Lifestyle  . Physical activity:    Days per week:  Not on file    Minutes per session: Not on file  . Stress: Not on file  Relationships  . Social connections:    Talks on phone: Not on file    Gets together: Not on file    Attends religious service: Not on file    Active member of club or organization: Not on file    Attends meetings of clubs or organizations: Not on file    Relationship status: Not on file  Other Topics Concern  . Not on file  Social History Narrative  . Not on file   Additional Social History:                         Sleep: Fair  Appetite:  Fair  Current Medications: Current Facility-Administered Medications  Medication Dose Route Frequency Provider  Last Rate Last Dose  . acetaminophen (TYLENOL) tablet 650 mg  650 mg Oral Q6H PRN Clapacs, Madie Reno, MD   650 mg at 01/03/19 2053  . alum & mag hydroxide-simeth (MAALOX/MYLANTA) 200-200-20 MG/5ML suspension 30 mL  30 mL Oral Q4H PRN Lamont Dowdy, NP      . amLODipine (NORVASC) tablet 5 mg  5 mg Oral Daily Kolluru, Sarath, MD   5 mg at 01/05/19 0828  . Chlorhexidine Gluconate Cloth 2 % PADS 6 each  6 each Topical Q0600 Murlean Iba, MD   6 each at 01/05/19 0603  . divalproex (DEPAKOTE) DR tablet 250 mg  250 mg Oral TID Chauncey Mann, MD   250 mg at 01/05/19 0826  . epoetin alfa (EPOGEN,PROCRIT) injection 10,000 Units  10,000 Units Intravenous Q M,W,F-HD Kolluru, Sarath, MD      . gabapentin (NEURONTIN) capsule 100 mg  100 mg Oral BID Chauncey Mann, MD   100 mg at 01/05/19 1950  . lidocaine-prilocaine (EMLA) cream   Topical Q M,W,F Murlean Iba, MD      . multivitamin with minerals tablet 1 tablet  1 tablet Oral Daily Clapacs, Madie Reno, MD   1 tablet at 01/05/19 0827  . OLANZapine (ZYPREXA) tablet 20 mg  20 mg Oral QHS Clapacs, Madie Reno, MD   20 mg at 01/04/19 2104  . OLANZapine (ZYPREXA) tablet 5 mg  5 mg Oral Daily Clapacs, Madie Reno, MD   5 mg at 01/05/19 0826  . pantoprazole (PROTONIX) EC tablet 40 mg  40 mg Oral Daily Clapacs, Madie Reno, MD   40 mg at 01/05/19 0826  . senna (SENOKOT) tablet 8.6-17.2 mg  1-2 tablet Oral QHS PRN Lamont Dowdy, NP   17.2 mg at 12/29/18 2226  . sodium zirconium cyclosilicate (LOKELMA) packet 10 g  10 g Oral BID Lateef, Munsoor, MD      . temazepam (RESTORIL) capsule 15 mg  15 mg Oral QHS Clapacs, Madie Reno, MD   15 mg at 01/04/19 2104  . thiamine (VITAMIN B-1) tablet 50 mg  50 mg Oral Daily Clapacs, Madie Reno, MD   50 mg at 01/05/19 9326    Lab Results:  Results for orders placed or performed during the hospital encounter of 12/25/18 (from the past 48 hour(s))  Valproic acid level     Status: Abnormal   Collection Time: 01/04/19  6:31 AM  Result Value Ref  Range   Valproic Acid Lvl 37 (L) 50.0 - 100.0 ug/mL    Comment: Performed at [facility], [address], [place], [place] [address]  CBC with Differential/Platelet  Status: Abnormal   Collection Time: 01/05/19  7:07 AM  Result Value Ref Range   WBC 7.7 4.0 - 10.5 K/uL   RBC 2.63 (L) 3.87 - 5.11 MIL/uL   Hemoglobin 8.1 (L) 12.0 - 15.0 g/dL   HCT 25.0 (L) 36.0 - 46.0 %   MCV 95.1 80.0 - 100.0 fL   MCH 30.8 26.0 - 34.0 pg   MCHC 32.4 30.0 - 36.0 g/dL   RDW 17.9 (H) 11.5 - 15.5 %   Platelets 176 150 - 400 K/uL   nRBC 0.0 0.0 - 0.2 %   Neutrophils Relative % 75 %   Neutro Abs 5.7 1.7 - 7.7 K/uL   Lymphocytes Relative 13 %   Lymphs Abs 1.0 0.7 - 4.0 K/uL   Monocytes Relative 9 %   Monocytes Absolute 0.7 0.1 - 1.0 K/uL   Eosinophils Relative 3 %   Eosinophils Absolute 0.2 0.0 - 0.5 K/uL   Basophils Relative 0 %   Basophils Absolute 0.0 0.0 - 0.1 K/uL   Immature Granulocytes 0 %   Abs Immature Granulocytes 0.03 0.00 - 0.07 K/uL    Comment: Performed at [facility], [address] [place], [place] [address]  Comprehensive metabolic panel     Status: Abnormal   Collection Time: 01/05/19  7:07 AM  Result Value Ref Range   Sodium 135 135 - 145 mmol/L   Potassium 6.2 (H) 3.5 - 5.1 mmol/L   Chloride 98 98 - 111 mmol/L   CO2 24 22 - 32 mmol/L   Glucose, Bld 84 70 - 99 mg/dL   BUN 99 (H) 8 - 23 mg/dL    Comment: RESULT CONFIRMED BY MANUAL DILUTION JJB   Creatinine, Ser 9.19 (H) 0.44 - 1.00 mg/dL    Comment: RESULTS VERIFIED BY REPEAT TESTING JJB   Calcium 9.1 8.9 - 10.3 mg/dL   Total Protein 6.4 (L) 6.5 - 8.1 g/dL   Albumin 3.1 (L) 3.5 - 5.0 g/dL   AST 25 15 - 41 U/L   ALT 16 0 - 44 U/L   Alkaline Phosphatase 60 38 - 126 U/L   Total Bilirubin 0.7 0.3 - 1.2 mg/dL   GFR calc non Af Amer 4 (L) >60 mL/min   GFR calc Af Amer 5 (L) >60 mL/min   Anion gap 13 5 - 15    Comment: Performed at [facility], [address] [place], [place] [address]   Ammonia     Status: None   Collection Time: 01/05/19  7:07 AM  Result Value Ref Range   Ammonia 22 9 - 35 umol/L    Comment: Performed at [facility], [address] [place], [place] [address]  Lithium level     Status: Abnormal   Collection Time: 01/05/19  7:07 AM  Result Value Ref Range   Lithium Lvl 0.23 (L) 0.60 - 1.20 mmol/L    Comment: Performed at [facility], [address] [place], [place] [address]    Blood Alcohol level:  Lab Results  Component Value Date   [facility] <10 01/03/2019   ETH <5 49/70/2637    Metabolic Disorder Labs: No results found for: HGBA1C, MPG No results found for: PROLACTIN No results found for: CHOL, TRIG, HDL, CHOLHDL, VLDL, LDLCALC  Physical Findings: AIMS:  , ,  ,  ,    CIWA:    COWS:     Musculoskeletal: Strength & Muscle Tone: within normal limits Gait & Station: shuffle Patient leans: N/A  Psychiatric Specialty Exam: Physical Exam  Nursing note and vitals reviewed. Constitutional: She appears well-developed and well-nourished.  HENT:  Head: Normocephalic and atraumatic.  Eyes: Pupils are equal, round, and reactive to light. Conjunctivae are normal.  Neck: Normal range of motion.  Cardiovascular: Regular rhythm and normal heart sounds.  Respiratory: Effort normal. No respiratory distress.  GI: Soft.  Musculoskeletal: Normal range of motion.  Neurological: She is alert.  Skin: Skin is warm and dry.  Psychiatric: Her mood appears anxious. Her speech is slurred. She is slowed. She is not agitated. Thought content is delusional. Thought content is not paranoid. Cognition and memory are impaired. She expresses no homicidal ideation.    Review of Systems  Constitutional: Negative.   HENT: Negative.   Eyes: Negative.   Respiratory: Negative.   Cardiovascular: Negative.   Gastrointestinal: Negative.   Musculoskeletal: Negative.   Skin: Negative.   Neurological: Negative.     Blood pressure (!) 150/72, pulse  97, temperature 98.8 F (37.1 C), temperature source Axillary, resp. rate 16, height 5\' 4"  (1.626 m), weight 60.4 kg, SpO2 97 %.Body mass index is 22.86 kg/m.  General Appearance: Casual  Eye Contact:  Minimal  Speech:  Garbled and Slurred  Volume:  Decreased  Mood:  Dysphoric  Affect:  Constricted  Thought Process:  Disorganized  Orientation:  Negative  Thought Content:  Illogical  Suicidal Thoughts:  No  Homicidal Thoughts:  No  Memory:  Immediate;   Fair Recent;   Poor Remote;   Poor  Judgement:  Impaired  Insight:  Lacking  Psychomotor Activity:  Tremor  Concentration:  Concentration: Poor  Recall:  Poor  Fund of Knowledge:  Fair  Language:  Fair  Akathisia:  No  Handed:  Right  AIMS (if indicated):     Assets:  Housing  ADL's:  Impaired  Cognition:  Impaired,  Mild  Sleep:  Number of Hours: 5     Treatment Plan Summary: Daily contact with patient to assess and evaluate symptoms and progress in treatment, Medication management and Plan Patient appeared to be more altered and more like a delirious person today also trembling.  So far no clear evidence of what is causing this.  Blood levels of lithium and Depakote normal although the free Depakote level is still pending.  Other tests have been ordered nothing really back yet other than the EKG which showed no significant change.  I will check to see if an ammonia level has been ordered.  Patient did receive dialysis today which we would hope would probably ultimately help with her mental state.  The only reason I had of course increased her Depakote was because she was still very manic as of the last few days.  Now that is been cut back again.  We will see if we can get the tremor improved and the thinking at least back to just the psychotic condition rather than a delirious 1.  Alethia Berthold, MD 01/05/2019, 5:11 PM

## 2019-01-05 NOTE — Plan of Care (Signed)
D: Patient remains somewhat intrusive, attempting to enter nurses' station at times, although less than upon initial admit. Continues to be hyperverbal, disorganized, with flight of ideas--conversation consists of same repetitive themes. She has complained of joint stiffness and pain, but refuses pain medication. States "my doctor wants me off that medication". Showed me a paper with "metoprolol" written on it--patient may be confusing that with tylenol. Still refusing pain medication and appears tired after hs medications, but is fighting drowsiness. Patient sat on floor in her room and spread contents of her folder all around her on the floor, obsessing over "some papers". Redirected to get back in bed several times, and then complained of pain and stiffness when she tried to get up and required assistance to her feet and into bed. Patient had been earlier observed bending down and touching  her toes, but has been observed moving more stiffly than usual and complaining of pain in her hips and knees. Continues to obsess over getting her red vitamin pill.  A: Continue to monitor for safety and offer support. R: Safety maintained.

## 2019-01-05 NOTE — Progress Notes (Signed)
Notified by nursing that patient has become more confused over night and is complaining of worsening hip pain to where she was sitting on the floor. Will place with 1:1 sitter for now due to fall risk. Will also check ammonia level, CBC , CMP.Will also check free valproic acid level. Depakote put back down to 250mg  po TID for now. Will discuss with Dr Weber Cooks.

## 2019-01-05 NOTE — Progress Notes (Signed)
1:1 Observation Note  1900 Pt. Observed in room talking to 1:1 present for safety. No distress noted. 2000 Pt. Observed in room talking to 1:1 present for safety. No distress noted.  2100 Pt. Observed in room talking to 1:1 present for safety. No distress noted.  2200 Pt. Observed in room talking to 1:1 present for safety. No distress noted.  2300 Pt. Observed in room talking to 1:1 present for safety. No distress noted.  0000 Pt. Observed in room sleeping with 1:1 present for safety. No distress noted with even respirations.  0100 Pt. Observed in room sleeping with 1:1 present for safety. No distress noted with even respirations.  0200 Pt. Observed in room sleeping with 1:1 present for safety. No distress noted with even respirations.  0300 Pt. Observed in room sleeping with 1:1 present for safety. No distress noted with even respirations.  0400 Pt. Observed in room sleeping with 1:1 present for safety. No distress noted with even respirations.  0500 Pt. Observed in room sleeping with 1:1 present for safety. No distress noted with even respirations.

## 2019-01-05 NOTE — Progress Notes (Signed)
Pre HD Assessment  Upon arrival with sitter, pt is noticeably less interactive than usual, with more garbled, trailing speech patterns. More drowsy than previous encounters. Will monitor closely. Noted hyperkalemia. Plan for 1.5 liter removal over 3 hour treatment. Will monitor closely.    01/05/19 1500  Neurological  Level of Consciousness Alert  Orientation Level Oriented to person;Disoriented to place;Disoriented to time;Disoriented to situation  Respiratory  Respiratory Pattern Regular  Chest Assessment Chest expansion symmetrical  Bilateral Breath Sounds Clear  Cough Non-productive  Cardiac  Pulse Regular  Heart Sounds S1, S2  ECG Monitor Yes  Cardiac Rhythm NSR  Ectopy Couplet PVC's  Ectopy Frequency Rare  Vascular  R Dorsalis Pedis Pulse +2  L Dorsalis Pedis Pulse +2  Integumentary  Integumentary (WDL) X  Skin Color Appropriate for ethnicity  Additional Integumentary Comments HD pt  Musculoskeletal  Musculoskeletal (WDL) X  Generalized Weakness Yes  Gastrointestinal  Bowel Sounds Assessment Active  GU Assessment  Genitourinary (WDL) X  Genitourinary Symptoms  (HD pt - oliguria )  Psychosocial  Psychosocial (WDL) X  Patient Behaviors  (More mumbling/not clearly spoken words,trailing off.drowsy. )  Emotional support given Given to patient

## 2019-01-06 DIAGNOSIS — F312 Bipolar disorder, current episode manic severe with psychotic features: Secondary | ICD-10-CM | POA: Diagnosis not present

## 2019-01-06 LAB — BASIC METABOLIC PANEL
Anion gap: 14 (ref 5–15)
BUN: 78 mg/dL — ABNORMAL HIGH (ref 8–23)
CO2: 25 mmol/L (ref 22–32)
Calcium: 9.3 mg/dL (ref 8.9–10.3)
Chloride: 98 mmol/L (ref 98–111)
Creatinine, Ser: 8 mg/dL — ABNORMAL HIGH (ref 0.44–1.00)
GFR calc Af Amer: 5 mL/min — ABNORMAL LOW (ref >60)
GFR calc non Af Amer: 5 mL/min — ABNORMAL LOW (ref >60)
Glucose, Bld: 85 mg/dL (ref 70–99)
Potassium: 5.7 mmol/L — ABNORMAL HIGH (ref 3.5–5.1)
Sodium: 137 mmol/L (ref 135–145)

## 2019-01-06 LAB — VALPROIC ACID LEVEL: Valproic Acid Lvl: 23 ug/mL — ABNORMAL LOW (ref 50.0–100.0)

## 2019-01-06 LAB — MAGNESIUM: Magnesium: 2 mg/dL (ref 1.7–2.4)

## 2019-01-06 LAB — VALPROIC ACID LEVEL, FREE: Valproic Acid, Free: 21.4 ug/mL (ref 6.0–22.0)

## 2019-01-06 LAB — RPR: RPR Ser Ql: NONREACTIVE

## 2019-01-06 LAB — HIV ANTIBODY (ROUTINE TESTING W REFLEX): HIV Screen 4th Generation wRfx: NONREACTIVE

## 2019-01-06 NOTE — Progress Notes (Signed)
Recreation Therapy Notes  Date: 01/06/2019  Time: 9:30 am   Location: Craft room   Behavioral response: N/A   Intervention Topic: Necessities   Discussion/Intervention: Patient did not attend group.   Clinical Observations/Feedback:  Patient did not attend group.   Annia Gomm LRT/CTRS         Javen Hinderliter 01/06/2019 11:21 AM

## 2019-01-06 NOTE — Progress Notes (Signed)
3 pm -7pm  D: Patient   remained to have  A 1:1 this shift . Continue to talk non stop.  Patient refused evening medication  Appetite good at mealtime  Sleeping at present time  Voice no other concerns  . Affect cheerful on approach . Receptive to information received . Compliant with medication .

## 2019-01-06 NOTE — Plan of Care (Signed)
Pt. Is compliant with medications with lots of direction and encouragement from staff. Pt. Able to remain safe and is monitored with 1:1 observations for safety, due to recent change in condition.    Problem: Safety: Goal: Periods of time without injury will increase Outcome: Progressing   Problem: Health Behavior/Discharge Planning: Goal: Compliance with prescribed medication regimen will improve Outcome: Progressing

## 2019-01-06 NOTE — Progress Notes (Signed)
11am -3pm   D:Patient able to take am medication that she refused this am . Patient met with MD Clapacs . Patient remained  In room  This period  Of time Patient continue to talk the entire time . Conversation over her past and how she was raised .  Appetite good at lunch .  A: Encourage patient participation with unit programming . Instruction  Given on  Medication , verbalize understanding. R: Voice no other concerns. Staff continue to monitor

## 2019-01-06 NOTE — Progress Notes (Signed)
7am - 11am  D: Patient  Remains to have a sitter present . Patient also continue to have jerk like movement  With body and arms . Appropriate ADL'S and personal chores . Affect flat  and somber . Patient remain in room all of this time .  Continue to talk the entire time  With 1:1 refused am medication . Informed Dr. Weber Cooks     A: Encourage patient participation with unit programming .  R: Voice no other concerns. Staff continue to monitor

## 2019-01-06 NOTE — Progress Notes (Signed)
[facility] MD Progress Note  01/06/2019 3:21 PM Madison [name]  MRN:  [id number] Subjective: Patient seen and chart reviewed.  After a couple days of what appeared to be delirium accompanied by stopping a bunch of her medicine we now seem to be back where we were before.  Patient continues to talk very rapidly and in a disorganized way.  You cannot get a word in with her and cannot make her understand anything because she will not stop talking.  She is eating adequately.  The tremor is gone.  Vitals are okay.  She is cooperative with dialysis.  She is refusing some of her medicine because she will not believe that the pills are the thing she is really supposed to take. Principal Problem: Bipolar affective disorder, current episode manic with psychotic symptoms ([place]) Diagnosis: Principal Problem:   Bipolar affective disorder, current episode manic with psychotic symptoms ([place]) Active Problems:   End stage renal disease ([place])   Schizoaffective disorder, bipolar type ([place])  Total Time spent with patient: 30 minutes  Past Psychiatric History: Patient has a long history of bipolar disorder essentially lifelong multiple hospitalizations.  Chronic lack of function.  Past Medical History:  Past Medical History:  Diagnosis Date  . Anxiety   . ESRD (end stage renal disease) on dialysis ([place])   . Neuropathy   . Schizophrenia [facility])     Past Surgical History:  Procedure Laterality Date  . CHOLECYSTECTOMY    . COLONOSCOPY N/A 01/20/2016   Dr. Oneida Alar: 12 mm tubular adenoma removed from the transverse colon, 4 hyperplastic polyps removed from the rectum and sigmoid colon.  Next colonoscopy planned for April 2020.  Marland Kitchen ESOPHAGOGASTRODUODENOSCOPY N/A 12/19/2017   Procedure: ESOPHAGOGASTRODUODENOSCOPY (EGD);  Surgeon: [name], MD;  Location: [facility];  Service: Endoscopy;  Laterality: N/A;  8:30am  . fistula left arm    . GIVENS CAPSULE STUDY N/A 01/14/2018   Procedure: GIVENS CAPSULE STUDY;  Surgeon:  [name], MD;  Location: [facility];  Service: Endoscopy;  Laterality: N/A;  7:30am  . HEMICOLECTOMY Right   . TUBAL LIGATION     Family History:  Family History  Problem Relation Age of Onset  . Alzheimer's disease Mother   . Cancer Mother        pancreatic  . Prostate cancer Father   . Kidney failure Sister   . Breast cancer Neg Hx   . Colon cancer Neg Hx    Family Psychiatric  History: Twin sister who is deceased who had mental illness Social History:  Social History   Substance and Sexual Activity  Alcohol Use No     Social History   Substance and Sexual Activity  Drug Use No    Social History   Socioeconomic History  . Marital status: Single    Spouse name: Not on file  . Number of children: Not on file  . Years of education: Not on file  . Highest education level: Not on file  Occupational History  . Not on file  Social Needs  . Financial resource strain: Not on file  . Food insecurity:    Worry: Not on file    Inability: Not on file  . Transportation needs:    Medical: Not on file    Non-medical: Not on file  Tobacco Use  . Smoking status: Never Smoker  . Smokeless tobacco: Never Used  Substance and Sexual Activity  . Alcohol use: No  . Drug use: No  .  Sexual activity: Yes  Lifestyle  . Physical activity:    Days per week: Not on file    Minutes per session: Not on file  . Stress: Not on file  Relationships  . Social connections:    Talks on phone: Not on file    Gets together: Not on file    Attends religious service: Not on file    Active member of club or organization: Not on file    Attends meetings of clubs or organizations: Not on file    Relationship status: Not on file  Other Topics Concern  . Not on file  Social History Narrative  . Not on file   Additional Social History:                         Sleep: Fair  Appetite:  Fair  Current Medications: Current Facility-Administered Medications  Medication  Dose Route Frequency Provider Last Rate Last Dose  . acetaminophen (TYLENOL) tablet 650 mg  650 mg Oral Q6H PRN Oletta Buehring, Madie Reno, MD   650 mg at 01/03/19 2053  . alum & mag hydroxide-simeth (MAALOX/MYLANTA) 200-200-20 MG/5ML suspension 30 mL  30 mL Oral Q4H PRN Thomspon, Geni Bers, NP      . amLODipine (NORVASC) tablet 5 mg  5 mg Oral Daily Kolluru, Sarath, MD   5 mg at 01/06/19 0737  . Chlorhexidine Gluconate Cloth 2 % PADS 6 each  6 each Topical Q0600 Murlean Iba, MD   6 each at 01/06/19 0545  . divalproex (DEPAKOTE) DR tablet 250 mg  250 mg Oral TID Chauncey Mann, MD   250 mg at 01/06/19 0738  . epoetin alfa (EPOGEN,PROCRIT) injection 10,000 Units  10,000 Units Intravenous Q M,W,F-HD Lavonia Dana, MD   10,000 Units at 01/05/19 1740  . gabapentin (NEURONTIN) capsule 100 mg  100 mg Oral BID Chauncey Mann, MD   100 mg at 01/06/19 0738  . lidocaine-prilocaine (EMLA) cream   Topical Q M,W,F Murlean Iba, MD      . multivitamin with minerals tablet 1 tablet  1 tablet Oral Daily Adrin Julian, Madie Reno, MD   1 tablet at 01/06/19 [phone]  . OLANZapine (ZYPREXA) tablet 20 mg  20 mg Oral QHS Presleigh Feldstein, Madie Reno, MD   20 mg at 01/05/19 2106  . OLANZapine (ZYPREXA) tablet 5 mg  5 mg Oral Daily Sevag Shearn, Madie Reno, MD   5 mg at 01/06/19 0738  . pantoprazole (PROTONIX) EC tablet 40 mg  40 mg Oral Daily Rayven Hendrickson, Madie Reno, MD   40 mg at 01/06/19 0737  . senna (SENOKOT) tablet 8.6-17.2 mg  1-2 tablet Oral QHS PRN Lamont Dowdy, NP   17.2 mg at 12/29/18 2226  . temazepam (RESTORIL) capsule 15 mg  15 mg Oral QHS Kerri Asche, Madie Reno, MD   15 mg at 01/05/19 2106  . thiamine (VITAMIN B-1) tablet 50 mg  50 mg Oral Daily Zo Loudon, Madie Reno, MD   50 mg at 01/06/19 9147    Lab Results:  Results for orders placed or performed during the hospital encounter of 12/25/18 (from the past 48 hour(s))  CBC with Differential/Platelet     Status: Abnormal   Collection Time: 01/05/19  7:07 AM  Result Value Ref Range   WBC 7.7 4.0 - 10.5  K/uL   RBC 2.63 (L) 3.87 - 5.11 MIL/uL   Hemoglobin 8.1 (L) 12.0 - 15.0 g/dL   HCT 25.0 (L) 36.0 - 46.0 %   MCV  95.1 80.0 - 100.0 fL   MCH 30.8 26.0 - 34.0 pg   MCHC 32.4 30.0 - 36.0 g/dL   RDW 17.9 (H) 11.5 - 15.5 %   Platelets 176 150 - 400 K/uL   nRBC 0.0 0.0 - 0.2 %   Neutrophils Relative % 75 %   Neutro Abs 5.7 1.7 - 7.7 K/uL   Lymphocytes Relative 13 %   Lymphs Abs 1.0 0.7 - 4.0 K/uL   Monocytes Relative 9 %   Monocytes Absolute 0.7 0.1 - 1.0 K/uL   Eosinophils Relative 3 %   Eosinophils Absolute 0.2 0.0 - 0.5 K/uL   Basophils Relative 0 %   Basophils Absolute 0.0 0.0 - 0.1 K/uL   Immature Granulocytes 0 %   Abs Immature Granulocytes 0.03 0.00 - 0.07 K/uL    Comment: Performed at [facility], [address] [place], [place] [address]  Comprehensive metabolic panel     Status: Abnormal   Collection Time: 01/05/19  7:07 AM  Result Value Ref Range   Sodium 135 135 - 145 mmol/L   Potassium 6.2 (H) 3.5 - 5.1 mmol/L   Chloride 98 98 - 111 mmol/L   CO2 24 22 - 32 mmol/L   Glucose, Bld 84 70 - 99 mg/dL   BUN 99 (H) 8 - 23 mg/dL    Comment: RESULT CONFIRMED BY MANUAL DILUTION JJB   Creatinine, Ser 9.19 (H) 0.44 - 1.00 mg/dL    Comment: RESULTS VERIFIED BY REPEAT TESTING JJB   Calcium 9.1 8.9 - 10.3 mg/dL   Total Protein 6.4 (L) 6.5 - 8.1 g/dL   Albumin 3.1 (L) 3.5 - 5.0 g/dL   AST 25 15 - 41 U/L   ALT 16 0 - 44 U/L   Alkaline Phosphatase 60 38 - 126 U/L   Total Bilirubin 0.7 0.3 - 1.2 mg/dL   GFR calc non Af Amer 4 (L) >60 mL/min   GFR calc Af Amer 5 (L) >60 mL/min   Anion gap 13 5 - 15    Comment: Performed at [facility], [address] [place], [place] [address]  Ammonia     Status: None   Collection Time: 01/05/19  7:07 AM  Result Value Ref Range   Ammonia 22 9 - 35 umol/L    Comment: Performed at [facility], [address] [place], [place] [address]  Valproic acid level, free     Status: None   Collection Time: 01/05/19  7:07  AM  Result Value Ref Range   Valproic Acid, Free 21.4 6.0 - 22.0 ug/mL    Comment: (NOTE)                                Detection Limit = 0.5 Performed At: [facility] [place], [place] [id number] Rush Farmer MD [id number]   Lithium level     Status: Abnormal   Collection Time: 01/05/19  7:07 AM  Result Value Ref Range   Lithium Lvl 0.23 (L) 0.60 - 1.20 mmol/L    Comment: Performed at [facility], [address] [place], [place] [address]  TSH     Status: None   Collection Time: 01/05/19  4:59 PM  Result Value Ref Range   TSH 1.540 0.350 - 4.500 uIU/mL    Comment: Performed by a 3rd Generation assay with a functional sensitivity of <=0.01 uIU/mL. Performed at [facility], [phone] [place], [place] [address]   RPR     Status: None   Collection Time: 01/05/19  4:59 PM  Result Value Ref Range   RPR Ser Ql Non Reactive Non Reactive    Comment: (NOTE) Performed At: [facility] [address] [place], [place] [id number] Rush Farmer MD [id number]   HIV Antibody (routine testing w rflx)     Status: None   Collection Time: 01/05/19  4:59 PM  Result Value Ref Range   HIV Screen 4th Generation wRfx Non Reactive Non Reactive    Comment: (NOTE) Performed At: [facility] [place], [place] [id number] Rush Farmer MD [id number]   Vitamin B12     Status: Abnormal   Collection Time: 01/05/19  4:59 PM  Result Value Ref Range   Vitamin B-12 945 (H) 180 - 914 pg/mL    Comment: (NOTE) This assay is not validated for testing neonatal or myeloproliferative syndrome specimens for Vitamin B12 levels. Performed at [facility], [place] [address] [place], [place] [address]   Folate     Status: None   Collection Time: 01/05/19  4:59 PM  Result Value Ref Range   Folate 16.2 >5.9 ng/mL    Comment: Performed at [facility], [address] [place], [place] [address]  Valproic  acid level     Status: Abnormal   Collection Time: 01/06/19  6:44 AM  Result Value Ref Range   Valproic Acid Lvl 23 (L) 50.0 - 100.0 ug/mL    Comment: Performed at [facility], [address] [place], [place] [address]    Blood Alcohol level:  Lab Results  Component Value Date   [facility] <10 01/10/2019   ETH <5 75/64/3329    Metabolic Disorder Labs: No results found for: HGBA1C, MPG No results found for: PROLACTIN No results found for: CHOL, TRIG, HDL, CHOLHDL, VLDL, LDLCALC  Physical Findings: AIMS:  , ,  ,  ,    CIWA:    COWS:     Musculoskeletal: Strength & Muscle Tone: within normal limits Gait & Station: normal Patient leans: N/A  Psychiatric Specialty Exam: Physical Exam  Nursing note and vitals reviewed. Constitutional: She appears well-developed and well-nourished.  HENT:  Head: Normocephalic and atraumatic.  Eyes: Pupils are equal, round, and reactive to light. Conjunctivae are normal.  Neck: Normal range of motion.  Cardiovascular: Regular rhythm and normal heart sounds.  Respiratory: Effort normal.  GI: Soft.  Musculoskeletal: Normal range of motion.  Neurological: She is alert.  Skin: Skin is warm and dry.  Psychiatric: Her affect is inappropriate. Her speech is rapid and/or pressured and tangential. She is agitated. She is not aggressive. Thought content is delusional. Cognition and memory are impaired. She expresses inappropriate judgment.    Review of Systems  Constitutional: Negative.   HENT: Negative.   Eyes: Negative.   Respiratory: Negative.   Cardiovascular: Negative.   Gastrointestinal: Negative.   Musculoskeletal: Negative.   Skin: Negative.   Neurological: Negative.   Psychiatric/Behavioral: The patient is nervous/anxious.     Blood pressure (!) 142/68, pulse (!) 106, temperature 98.8 F (37.1 C), temperature source Oral, resp. rate 18, height 5\' 4"  (1.626 m), weight 60.4 kg, SpO2 98 %.Body mass index is 22.86 kg/m.  General  Appearance: Casual  Eye Contact:  Minimal  Speech:  Garbled and Pressured  Volume:  Increased  Mood:  Dysphoric  Affect:  Congruent  Thought Process:  Disorganized  Orientation:  Negative  Thought Content:  Illogical, Rumination and Tangential  Suicidal Thoughts:  No  Homicidal Thoughts:  No  Memory:  Immediate;   Fair Recent;   Poor Remote;   Poor  Judgement:  Impaired  Insight:  Shallow  Psychomotor Activity:  Decreased  Concentration:  Concentration: Fair  Recall:  [employer] of Knowledge:  Fair  Language:  Fair  Akathisia:  No  Handed:  Right  AIMS (if indicated):     Assets:  Resilience Social Support  ADL's:  Impaired  Cognition:  Impaired,  Mild  Sleep:  Number of Hours: 7.15     Treatment Plan Summary: Daily contact with patient to assess and evaluate symptoms and progress in treatment, Medication management and Plan Patient is back to just being regularly psychotic and agitated.  The delirium seems to have cleared up.  Still not clear what that was all about since her lithium and Depakote levels were on the low side.  We will try to get a recheck of blood including a recheck of her other electrolytes and she is complaining of muscle cramping and soreness.  Continue dialysis.  Continue Zyprexa.  Hope that perhaps we can get her to take the Depakote at a reasonable dose.  Alethia Berthold, MD 01/06/2019, 3:21 PM

## 2019-01-06 NOTE — Progress Notes (Signed)
   [facility] NAME: Madison [name]    MR#:  [id number]  DATE OF BIRTH:  02/28/1951  SUBJECTIVE:   Doing well this morning.  Denies any concerns.  Per nursing staff, her delirium has gotten much better.  Still continuing to talk incessantly.  Appears manic.  REVIEW OF SYSTEMS:  ROS- unable to assess due to mania  DRUG ALLERGIES:  No Known Allergies VITALS:  Blood pressure (!) 142/68, pulse (!) 106, temperature 98.8 F (37.1 C), temperature source Oral, resp. rate 18, height 5\' 4"  (1.626 m), weight 60.4 kg, SpO2 98 %. PHYSICAL EXAMINATION:  Physical Exam  GENERAL:  Laying in the bed with no acute distress.  Walking around room, talking. HEENT: Head atraumatic, normocephalic. Pupils equal, round, reactive to light and accommodation. No scleral icterus. Extraocular muscles intact. Oropharynx and nasopharynx clear.  NECK:  Supple, no jugular venous distention. No thyroid enlargement. LUNGS: Lungs are clear to auscultation bilaterally. No wheezes, crackles, rhonchi. No use of accessory muscles of respiration.  CARDIOVASCULAR: RRR, S1, S2 normal. III/VI systolic murmur.  ABDOMEN: Soft, nontender, nondistended. Bowel sounds present.  EXTREMITIES: No pedal edema, cyanosis, or clubbing.  NEUROLOGIC: CN 2-12 intact, no focal deficits. 5/5 muscle strength throughout all extremities. Sensation intact throughout. Gait not checked.  PSYCHIATRIC: The patient is alert. Pressured speech. SKIN: No obvious rash, lesion, or ulcer.   LABORATORY PANEL:  Female CBC Recent Labs  Lab 01/05/19 0707  WBC 7.7  HGB 8.1*  HCT 25.0*  PLT 176   ------------------------------------------------------------------------------------------------------------------ Chemistries  Recent Labs  Lab 01/05/19 0707  NA 135  K 6.2*  CL 98  CO2 24  GLUCOSE 84  BUN 99*  CREATININE 9.19*  CALCIUM 9.1  AST 25  ALT 16  ALKPHOS 60  BILITOT 0.7   RADIOLOGY:  No  results found. ASSESSMENT AND PLAN:   Altered mental status- likely secondary to increase Depakote dose vs uremia.  Altered mental status has resolved with decreasing dose and with dialysis. -Continue Depakote at decreased dose -Ammonia, TSH, vitamin B12, folate, HIV, RPR all negative. -If patient develops altered mental status again, would consider CT head  ESRD on dialysis MWF -Nephrology following  Bipolar disorder with psychotic symptoms -Management per psychiatry  We will sign off at this time.  Please reconsult if further issues arise.  All the records are reviewed and case discussed with Care Management/Social Worker. Management plans discussed with the patient, family and they are in agreement.  CODE STATUS: Full Code  TOTAL TIME TAKING CARE OF THIS PATIENT: 35 minutes.   More than 50% of the time was spent in counseling/coordination of care: YES   Evette Doffing M.D on 01/06/2019 at 1:05 PM  Between 7am to 6pm - Pager - [phone]  After 6pm go to [email or web address] - Proofreader  [facility]  Office  [phone]  CC: Primary care physician; Lucianne Lei, MD  Note: This dictation was prepared with Dragon dictation along with smaller phrase technology. Any transcriptional errors that result from this process are unintentional.

## 2019-01-06 NOTE — Progress Notes (Signed)
D: Pt during assessments and/or observations is extremely pressured in her speech and disorganized in her thought process. Pt. For several hours is observed by this writer and 1:1 observer talking constantly for several hours without stopping. Pt. Is very difficult to engage with and requires redirection almost constantly during conversation. Pt. Not slurring her words or gabling this evening.   A: Q x 1 hour observation checks were completed for safety. Patient was provided with education, but shows no evidence of learning. Patient was given/offered medications per orders. Patient  was encourage to attend groups, participate in unit activities and continue with plan of care. Pt. Chart and plans of care reviewed. Pt. Given support and encouragement.   R: Patient is complaint with medications with lots of encouragement, support, and patient education on need for medicines. Pt. Isolative to room almost the entire shift. Pt. Snacks brought to her room, eating fair. Pt. Fluid restrictions maintained per orders. Pt. Fistula WDL. Pt. Given high falls risk education and monitored for safety, but ambulation has been steady. Will review 1:1 status with day shift treatment team for further need of this.             Precautionary 1:1 maintained  for safety maintained, room free of safety hazards, patient sustains no injury or falls during this shift. Will endorse care to next shift.

## 2019-01-06 NOTE — Progress Notes (Signed)
[facility]  ROUNDING NOTE   Subjective:  Pt seen at bedside. Conversant. Was speaking to her sister over the phone.  Tolerated HD well.    Objective:  Vital signs in last 24 hours:  Temp:  [98 F (36.7 C)-98.8 F (37.1 C)] 98.8 F (37.1 C) (04/14 0606) Pulse Rate:  [92-106] 106 (04/14 0606) Resp:  [16-22] 18 (04/14 0606) BP: (125-159)/(40-78) 142/68 (04/14 0606) SpO2:  [96 %-100 %] 98 % (04/14 0606)  Weight change:  Filed Weights    Intake/Output: I/O last 3 completed shifts: In: -  Out: 1500 [Other:1500]   Intake/Output this shift:  No intake/output data recorded.  Physical Exam: General: NAD  Head: Normocephalic, atraumatic. Moist oral mucosal membranes  Eyes: Anicteric  Neck: Supple, trachea midline  Lungs:  Clear to auscultation  Heart: Regular rate and rhythm  Abdomen:  Soft, nontender, BS present  Extremities: no peripheral edema.  Neurologic: Awake, alert  Skin: No lesions  Access: Left AVG    Basic Metabolic Panel: Recent Labs  Lab 01/01/19 0653 01/03/19 0641 01/05/19 0707  NA 139 136 135  K 5.7* 5.7* 6.2*  CL 98 98 98  CO2 29 28 24   GLUCOSE 86 107* 84  BUN 48* 52* 99*  CREATININE 5.58* 5.49* 9.19*  CALCIUM 9.4 9.6 9.1  PHOS 3.4 3.7  --     Liver Function Tests: Recent Labs  Lab 01/01/19 0653 01/03/19 0641 01/05/19 0707  AST  --   --  25  ALT  --   --  16  ALKPHOS  --   --  60  BILITOT  --   --  0.7  PROT  --   --  6.4*  ALBUMIN 3.3* 3.2* 3.1*   No results for input(s): LIPASE, AMYLASE in the last 168 hours. Recent Labs  Lab 01/05/19 0707  AMMONIA 22    CBC: Recent Labs  Lab 01/01/19 0653 01/03/19 0641 01/05/19 0707  WBC 4.9 5.6 7.7  NEUTROABS  --   --  5.7  HGB 9.4* 8.2* 8.1*  HCT 29.4* 25.6* 25.0*  MCV 95.1 96.2 95.1  PLT 189 170 176    Cardiac Enzymes: No results for input(s): CKTOTAL, CKMB, CKMBINDEX, TROPONINI in the last 168 hours.  BNP: Invalid input(s): POCBNP  CBG: No results for  input(s): GLUCAP in the last 168 hours.  Microbiology: Results for orders placed or performed during the hospital encounter of 08/15/17  MRSA PCR Screening     Status: Abnormal   Collection Time: 08/16/17  8:28 PM  Result Value Ref Range Status   MRSA by PCR POSITIVE (A) NEGATIVE Final    Comment:        The GeneXpert MRSA Assay (FDA approved for NASAL specimens only), is one component of a comprehensive MRSA colonization surveillance program. It is not intended to diagnose MRSA infection nor to guide or monitor treatment for MRSA infections. RESULT CALLED TO, READ BACK BY AND VERIFIED WITH: CRYSTAL BASS AT 2146 08/16/17.PMH     Coagulation Studies: No results for input(s): LABPROT, INR in the last 72 hours.  Urinalysis: No results for input(s): COLORURINE, LABSPEC, PHURINE, GLUCOSEU, HGBUR, BILIRUBINUR, KETONESUR, PROTEINUR, UROBILINOGEN, NITRITE, LEUKOCYTESUR in the last 72 hours.  Invalid input(s): APPERANCEUR    Imaging: No results found.   Medications:    . amLODipine  5 mg Oral Daily  . Chlorhexidine Gluconate Cloth  6 each Topical Q0600  . divalproex  250 mg Oral TID  . epoetin (EPOGEN/PROCRIT) injection  10,000 Units Intravenous Q M,W,F-HD  . gabapentin  100 mg Oral BID  . lidocaine-prilocaine   Topical Q M,W,F  . multivitamin with minerals  1 tablet Oral Daily  . OLANZapine  20 mg Oral QHS  . OLANZapine  5 mg Oral Daily  . pantoprazole  40 mg Oral Daily  . temazepam  15 mg Oral QHS  . thiamine  50 mg Oral Daily   acetaminophen, alum & mag hydroxide-simeth, senna  Assessment/ Plan:  [name] is a 68 y.o. black female with end-stage renal disease, bipolar disorder, hypertension was admitted on 12/25/2018 with manic symptoms.   MWF [facility]phrology [facility]   1.  End-stage renal disease: Pt completed dialysis yesterday.  No acute indication for dialysis today.  We will plan for dialysis again tomorrow.  2.  Anemia of chronic  kidney disease: Maintain the patient on Epogen 10,000 units MWF.  3.  Secondary hyperparathyroidism. -Phosphorus acceptable at 3.7 as the patient is off of her binders.  4. Hypertension: Blood pressure 142/68.  Maintain the patient on amlodipine.  5.  Hyperkalemia.  Patient was dialyzed against a 2K bath yesterday.  We plan to repeat serum potassium tomorrow.   LOS: 12 Asuzena Weis 4/14/20201:02 PM

## 2019-01-06 NOTE — BHH Group Notes (Signed)
Feelings Around Diagnosis 01/06/2019 1PM  Type of Therapy/Topic:  Group Therapy:  Feelings about Diagnosis  Participation Level:  Did Not Attend   Description of Group:   This group will allow patients to explore their thoughts and feelings about diagnoses they have received. Patients will be guided to explore their level of understanding and acceptance of these diagnoses. Facilitator will encourage patients to process their thoughts and feelings about the reactions of others to their diagnosis and will guide patients in identifying ways to discuss their diagnosis with significant others in their lives. This group will be process-oriented, with patients participating in exploration of their own experiences, giving and receiving support, and processing challenge from other group members.   Therapeutic Goals: 1. Patient will demonstrate understanding of diagnosis as evidenced by identifying two or more symptoms of the disorder 2. Patient will be able to express two feelings regarding the diagnosis 3. Patient will demonstrate their ability to communicate their needs through discussion and/or role play  Summary of Patient Progress:       Therapeutic Modalities:   Cognitive Behavioral Therapy Brief Therapy Feelings Identification    Yvette Rack, LCSW 01/06/2019 2:54 PM

## 2019-01-06 NOTE — Plan of Care (Addendum)
Mental status  change  Patient  unable to process information received 1:1 with patient .  Non compliant with medication . No participation with unit programing.  Able  to inform staff of anxiety needs  or  work on coping  issues    Problem: Education: Goal: Knowledge of [facility] Education information/materials will improve Outcome: Progressing   Problem: Coping: Goal: Ability to verbalize frustrations and anger appropriately will improve Outcome: Progressing Goal: Ability to demonstrate self-control will improve Outcome: Progressing   Problem: Safety: Goal: Periods of time without injury will increase Outcome: Progressing   Problem: Health Behavior/Discharge Planning: Goal: Compliance with prescribed medication regimen will improve Outcome: Progressing   Problem: Self-Care: Goal: Ability to participate in self-care as condition permits will improve Outcome: Progressing   Problem: Education: Goal: Ability to state activities that reduce stress will improve Outcome: Progressing   Problem: Coping: Goal: Ability to identify and develop effective coping behavior will improve Outcome: Progressing   Problem: Self-Concept: Goal: Ability to identify factors that promote anxiety will improve Outcome: Progressing Goal: Level of anxiety will decrease Outcome: Progressing Goal: Ability to modify response to factors that promote anxiety will improve Outcome: Progressing

## 2019-01-07 DIAGNOSIS — F312 Bipolar disorder, current episode manic severe with psychotic features: Secondary | ICD-10-CM | POA: Diagnosis not present

## 2019-01-07 NOTE — Progress Notes (Signed)
Pre HD Assessment    01/07/19 0850  Neurological  Level of Consciousness Alert  Orientation Level Oriented to person;Oriented to place  Respiratory  Respiratory Pattern Regular;Unlabored  Chest Assessment Chest expansion symmetrical  Bilateral Breath Sounds Clear  Cough Non-productive;Dry  Cardiac  Pulse Regular  Heart Sounds S1, S2  Vascular  R Dorsalis Pedis Pulse +2  L Dorsalis Pedis Pulse +2  Integumentary  Integumentary (WDL) WDL  Psychosocial  Psychosocial (WDL) WDL  Patient Behaviors Cooperative;Restless  Emotional support given Given to patient

## 2019-01-07 NOTE — Progress Notes (Signed)
[facility] MD Progress Note  01/07/2019 5:01 PM Madison [name]  MRN:  [id number] Subjective: Patient seen chart reviewed.  This is a patient with a long history of bipolar disorder.  Came into the hospital with manic symptoms.  Recently went through a phase of seeming to be confused and tremulous.  Medications have been backed off.  On interview today the patient is still disorganized in her thinking but has been much less intrusive and agitated.  She is taking care of her health better.  She certainly is not suicidal violent or aggressive.  She has been cooperative with her dialysis throughout.  Now back to just being on her Zyprexa again. Principal Problem: Bipolar affective disorder, current episode manic with psychotic symptoms ([place]) Diagnosis: Principal Problem:   Bipolar affective disorder, current episode manic with psychotic symptoms ([place]) Active Problems:   End stage renal disease ([place])   Schizoaffective disorder, bipolar type ([place])  Total Time spent with patient: 30 minutes  Past Psychiatric History: Patient has a lifelong history of chronic mental illness with multiple prior hospitalizations  Past Medical History:  Past Medical History:  Diagnosis Date  . Anxiety   . ESRD (end stage renal disease) on dialysis ([place])   . Neuropathy   . Schizophrenia [facility])     Past Surgical History:  Procedure Laterality Date  . CHOLECYSTECTOMY    . COLONOSCOPY N/A 01/20/2016   Dr. Oneida Alar: 12 mm tubular adenoma removed from the transverse colon, 4 hyperplastic polyps removed from the rectum and sigmoid colon.  Next colonoscopy planned for April 2020.  Marland Kitchen ESOPHAGOGASTRODUODENOSCOPY N/A 12/19/2017   Procedure: ESOPHAGOGASTRODUODENOSCOPY (EGD);  Surgeon: [name], MD;  Location: [facility] ENDO SUITE;  Service: Endoscopy;  Laterality: N/A;  8:30am  . fistula left arm    . GIVENS CAPSULE STUDY N/A 01/14/2018   Procedure: GIVENS CAPSULE STUDY;  Surgeon: [name], MD;  Location: [facility] ENDO SUITE;  Service:  Endoscopy;  Laterality: N/A;  7:30am  . HEMICOLECTOMY Right   . TUBAL LIGATION     Family History:  Family History  Problem Relation Age of Onset  . Alzheimer's disease Mother   . Cancer Mother        pancreatic  . Prostate cancer Father   . Kidney failure Sister   . Breast cancer Neg Hx   . Colon cancer Neg Hx    Family Psychiatric  History: Had a sister who was also similarly ill Social History:  Social History   Substance and Sexual Activity  Alcohol Use No     Social History   Substance and Sexual Activity  Drug Use No    Social History   Socioeconomic History  . Marital status: Single    Spouse name: Not on file  . Number of children: Not on file  . Years of education: Not on file  . Highest education level: Not on file  Occupational History  . Not on file  Social Needs  . Financial resource strain: Not on file  . Food insecurity:    Worry: Not on file    Inability: Not on file  . Transportation needs:    Medical: Not on file    Non-medical: Not on file  Tobacco Use  . Smoking status: Never Smoker  . Smokeless tobacco: Never Used  Substance and Sexual Activity  . Alcohol use: No  . Drug use: No  . Sexual activity: Yes  Lifestyle  . Physical activity:    Days per week: Not  on file    Minutes per session: Not on file  . Stress: Not on file  Relationships  . Social connections:    Talks on phone: Not on file    Gets together: Not on file    Attends religious service: Not on file    Active member of club or organization: Not on file    Attends meetings of clubs or organizations: Not on file    Relationship status: Not on file  Other Topics Concern  . Not on file  Social History Narrative  . Not on file   Additional Social History:                         Sleep: Fair  Appetite:  Fair  Current Medications: Current Facility-Administered Medications  Medication Dose Route Frequency Provider Last Rate Last Dose  . acetaminophen  (TYLENOL) tablet 650 mg  650 mg Oral Q6H PRN Zahraa Bhargava, Madie Reno, MD   650 mg at 01/03/19 2053  . alum & mag hydroxide-simeth (MAALOX/MYLANTA) 200-200-20 MG/5ML suspension 30 mL  30 mL Oral Q4H PRN Thomspon, Geni Bers, NP      . amLODipine (NORVASC) tablet 5 mg  5 mg Oral Daily Kolluru, Sarath, MD   5 mg at 01/07/19 0748  . Chlorhexidine Gluconate Cloth 2 % PADS 6 each  6 each Topical Q0600 Murlean Iba, MD   6 each at 01/06/19 0545  . divalproex (DEPAKOTE) DR tablet 250 mg  250 mg Oral TID Chauncey Mann, MD   250 mg at 01/07/19 1404  . epoetin alfa (EPOGEN,PROCRIT) injection 10,000 Units  10,000 Units Intravenous Q M,W,F-HD Kolluru, Sarath, MD   10,000 Units at 01/07/19 1200  . gabapentin (NEURONTIN) capsule 100 mg  100 mg Oral BID Chauncey Mann, MD   100 mg at 01/07/19 0748  . lidocaine-prilocaine (EMLA) cream   Topical Q M,W,F Murlean Iba, MD      . multivitamin with minerals tablet 1 tablet  1 tablet Oral Daily Siddalee Vanderheiden, Madie Reno, MD   1 tablet at 01/07/19 0748  . OLANZapine (ZYPREXA) tablet 20 mg  20 mg Oral QHS Kenyata Napier, Madie Reno, MD   20 mg at 01/06/19 2212  . OLANZapine (ZYPREXA) tablet 5 mg  5 mg Oral Daily Dewey Viens, Madie Reno, MD   5 mg at 01/07/19 0748  . pantoprazole (PROTONIX) EC tablet 40 mg  40 mg Oral Daily Quoc Tome, Madie Reno, MD   40 mg at 01/07/19 0748  . senna (SENOKOT) tablet 8.6-17.2 mg  1-2 tablet Oral QHS PRN Lamont Dowdy, NP   17.2 mg at 12/29/18 2226  . temazepam (RESTORIL) capsule 15 mg  15 mg Oral QHS Kamisha Ell, Madie Reno, MD   15 mg at 01/06/19 2211  . thiamine (VITAMIN B-1) tablet 50 mg  50 mg Oral Daily Meliton Samad, Madie Reno, MD   50 mg at 01/07/19 4332    Lab Results:  Results for orders placed or performed during the hospital encounter of 12/25/18 (from the past 48 hour(s))  Valproic acid level     Status: Abnormal   Collection Time: 01/06/19  6:44 AM  Result Value Ref Range   Valproic Acid Lvl 23 (L) 50.0 - 100.0 ug/mL    Comment: Performed at [facility], [address] [place], [place] [address]  Basic metabolic panel     Status: Abnormal   Collection Time: 01/06/19  8:52 PM  Result Value Ref Range   Sodium 137  135 - 145 mmol/L   Potassium 5.7 (H) 3.5 - 5.1 mmol/L   Chloride 98 98 - 111 mmol/L   CO2 25 22 - 32 mmol/L   Glucose, Bld 85 70 - 99 mg/dL   BUN 78 (H) 8 - 23 mg/dL   Creatinine, Ser 8.00 (H) 0.44 - 1.00 mg/dL   Calcium 9.3 8.9 - 10.3 mg/dL   GFR calc non Af Amer 5 (L) >60 mL/min   GFR calc Af Amer 5 (L) >60 mL/min   Anion gap 14 5 - 15    Comment: Performed at [facility], [address] [place], [place] [address]  Magnesium     Status: None   Collection Time: 01/06/19  8:52 PM  Result Value Ref Range   Magnesium 2.0 1.7 - 2.4 mg/dL    Comment: Performed at [facility], [address] [place],  [address]    Blood Alcohol level:  Lab Results  Component Value Date   [facility] <10 01/20/2019   ETH <5 28/76/8115    Metabolic Disorder Labs: No results found for: HGBA1C, MPG No results found for: PROLACTIN No results found for: CHOL, TRIG, HDL, CHOLHDL, VLDL, LDLCALC  Physical Findings: AIMS:  , ,  ,  ,    CIWA:    COWS:     Musculoskeletal: Strength & Muscle Tone: within normal limits Gait & Station: normal Patient leans: N/A  Psychiatric Specialty Exam: Physical Exam  Nursing note and vitals reviewed. Constitutional: She appears well-developed and well-nourished.  HENT:  Head: Normocephalic and atraumatic.  Eyes: Pupils are equal, round, and reactive to light. Conjunctivae are normal.  Neck: Normal range of motion.  Cardiovascular: Regular rhythm and normal heart sounds.  Respiratory: Effort normal.  GI: Soft.  Musculoskeletal: Normal range of motion.  Neurological: She is alert.  Skin: Skin is warm and dry.  Psychiatric: Her affect is labile. Her speech is tangential. She is agitated. She is not aggressive. Thought content is paranoid. Cognition and memory are impaired. She  expresses impulsivity.    Review of Systems  Constitutional: Negative.   HENT: Negative.   Eyes: Negative.   Respiratory: Negative.   Cardiovascular: Negative.   Gastrointestinal: Negative.   Musculoskeletal: Negative.   Skin: Negative.   Neurological: Negative.   Psychiatric/Behavioral: Positive for memory loss. Negative for depression, hallucinations, substance abuse and suicidal ideas. The patient is nervous/anxious. The patient does not have insomnia.     Blood pressure 131/63, pulse 85, temperature 98.1 F (36.7 C), temperature source Oral, resp. rate 16, height 5\' 4"  (1.626 m), weight 60.4 kg, SpO2 100 %.Body mass index is 22.86 kg/m.  General Appearance: Casual  Eye Contact:  Fair  Speech:  Clear and Coherent  Volume:  Normal  Mood:  Anxious  Affect:  Congruent  Thought Process:  Disorganized  Orientation:  Negative  Thought Content:  Rumination  Suicidal Thoughts:  No  Homicidal Thoughts:  No  Memory:  Immediate;   Fair Recent;   Poor Remote;   Poor  Judgement:  Impaired  Insight:  Shallow  Psychomotor Activity:  Normal  Concentration:  Concentration: Fair  Recall:  [employer] of Knowledge:  Fair  Language:  Fair  Akathisia:  No  Handed:  Right  AIMS (if indicated):     Assets:  Desire for Improvement Housing Social Support  ADL's:  Intact  Cognition:  Impaired,  Mild  Sleep:  Number of Hours: 6.5     Treatment Plan Summary: Daily  contact with patient to assess and evaluate symptoms and progress in treatment, Medication management and Plan Patient appears to be stabilizing and generally doing better.  I am proposing that we probably plan to get her discharged this week.  I was given a telephone number of 1 of her sisters and asked to give her a call.  That number was [phone].  I tried calling that number and no one answered after about 15 readings.  I would be happy to try and get in touch with the family but I am reviewing with social work that I think  we should probably try and send the patient back home to her family within the next couple days possibly after dialysis on Friday.  Alethia Berthold, MD 01/07/2019, 5:01 PM

## 2019-01-07 NOTE — Progress Notes (Signed)
Post HD Tx    01/07/19 1235  Neurological  Level of Consciousness Alert  Orientation Level Oriented to person;Oriented to place  Respiratory  Respiratory Pattern Regular;Unlabored  Chest Assessment Chest expansion symmetrical  Bilateral Breath Sounds Clear  Cough None  Cardiac  Pulse Regular  Heart Sounds S1, S2  Vascular  R Dorsalis Pedis Pulse +2  L Dorsalis Pedis Pulse +2  Integumentary  Integumentary (WDL) WDL  Psychosocial  Psychosocial (WDL) WDL  Patient Behaviors Cooperative;Restless  Emotional support given Given to patient

## 2019-01-07 NOTE — BHH Group Notes (Signed)
LCSW Group Therapy Note  01/07/2019 12:19 PM  Type of Therapy/Topic:  Group Therapy:  Emotion Regulation  Participation Level:  Did Not Attend   Description of Group:   The purpose of this group is to assist patients in learning to regulate negative emotions and experience positive emotions. Patients will be guided to discuss ways in which they have been vulnerable to their negative emotions. These vulnerabilities will be juxtaposed with experiences of positive emotions or situations, and patients will be challenged to use positive emotions to combat negative ones. Special emphasis will be placed on coping with negative emotions in conflict situations, and patients will process healthy conflict resolution skills.  Therapeutic Goals: 1. Patient will identify two positive emotions or experiences to reflect on in order to balance out negative emotions 2. Patient will label two or more emotions that they find the most difficult to experience 3. Patient will demonstrate positive conflict resolution skills through discussion and/or role plays  Summary of Patient Progress: x   Therapeutic Modalities:   Cognitive Behavioral Therapy Feelings Identification Dialectical Behavioral Therapy   Evalina Field, MSW, LCSW Clinical Social Work 01/07/2019 12:19 PM

## 2019-01-07 NOTE — Progress Notes (Signed)
Post HD [facility]    01/07/19 1230  Hand-Off documentation  Report given to (Full Name) [place], [place] RN  Report received from (Full Name) Beatris Ship, RN   Vital Signs  Temp 98.1 F (36.7 C)  Temp Source Oral  Pulse Rate 85  Pulse Rate Source Monitor  Resp 16  BP 131/63  BP Location Right Arm  BP Method Automatic  Patient Position (if appropriate) Sitting  Oxygen Therapy  SpO2 100 %  O2 Device Room Air  Pulse Oximetry Type Continuous  Pain Assessment  Pain Scale 0-10  Pain Score 0  Dialysis Weight  Weight  (unable to weigh, pt in recliner )  Type of Weight Post-Dialysis  Post-Hemodialysis Assessment  Rinseback Volume (mL) 250 mL  KECN 67.1 V  Dialyzer Clearance Lightly streaked  Duration of HD Treatment -hour(s) 3.5 hour(s)  Hemodialysis Intake (mL) 500 mL  UF Total -Machine (mL) 2009 mL  Net UF (mL) 1509 mL  Tolerated HD Treatment Yes  AVG/AVF Arterial Site Held (minutes) 10 minutes  AVG/AVF Venous Site Held (minutes) 10 minutes

## 2019-01-07 NOTE — Progress Notes (Addendum)
11-3pm D: Patient returned  From dialysis at 1245 affect cheerful receptive to staff. Compliant  With medication . Dialysis nurse  Informed writer they removed 1.5 liters of fluid . Appetite good at lunch. Interaction with peers . Went outside to court yard  Remained to have 1:1 sitter

## 2019-01-07 NOTE — Progress Notes (Signed)
Patient alert and oriented x 3 with periods of confusion to situation, on 1:1 for safety no distress noted, affect is flat and she brightens upon approach, 15 minutes safety checks maintained will continue to monitor.

## 2019-01-07 NOTE — Progress Notes (Signed)
[facility]  ROUNDING NOTE   Subjective:  Patient seen and evaluated during hemodialysis. Tolerating well.    Objective:  Vital signs in last 24 hours:  Temp:  [98.1 F (36.7 C)-98.2 F (36.8 C)] 98.1 F (36.7 C) (04/15 0850) Pulse Rate:  [60-103] 78 (04/15 1030) Resp:  [14-18] 14 (04/15 0900) BP: (133-169)/(66-75) 153/73 (04/15 1030) SpO2:  [98 %] 98 % (04/15 0900)  Weight change:  Filed Weights    Intake/Output: No intake/output data recorded.   Intake/Output this shift:  No intake/output data recorded.  Physical Exam: General: NAD  Head: Normocephalic, atraumatic. Moist oral mucosal membranes  Eyes: Anicteric  Neck: Supple, trachea midline  Lungs:  Clear to auscultation  Heart: Regular rate and rhythm  Abdomen:  Soft, nontender, BS present  Extremities: no peripheral edema.  Neurologic: Awake, alert  Skin: No lesions  Access: Left AVG    Basic Metabolic Panel: Recent Labs  Lab 01/01/19 0653 01/03/19 0641 01/05/19 0707 01/06/19 2052  NA 139 136 135 137  K 5.7* 5.7* 6.2* 5.7*  CL 98 98 98 98  CO2 29 28 24 25   GLUCOSE 86 107* 84 85  BUN 48* 52* 99* 78*  CREATININE 5.58* 5.49* 9.19* 8.00*  CALCIUM 9.4 9.6 9.1 9.3  MG  --   --   --  2.0  PHOS 3.4 3.7  --   --     Liver Function Tests: Recent Labs  Lab 01/01/19 0653 01/03/19 0641 01/05/19 0707  AST  --   --  25  ALT  --   --  16  ALKPHOS  --   --  60  BILITOT  --   --  0.7  PROT  --   --  6.4*  ALBUMIN 3.3* 3.2* 3.1*   No results for input(s): LIPASE, AMYLASE in the last 168 hours. Recent Labs  Lab 01/05/19 0707  AMMONIA 22    CBC: Recent Labs  Lab 01/01/19 0653 01/03/19 0641 01/05/19 0707  WBC 4.9 5.6 7.7  NEUTROABS  --   --  5.7  HGB 9.4* 8.2* 8.1*  HCT 29.4* 25.6* 25.0*  MCV 95.1 96.2 95.1  PLT 189 170 176    Cardiac Enzymes: No results for input(s): CKTOTAL, CKMB, CKMBINDEX, TROPONINI in the last 168 hours.  BNP: Invalid input(s): POCBNP  CBG: No  results for input(s): GLUCAP in the last 168 hours.  Microbiology: Results for orders placed or performed during the hospital encounter of 08/15/17  MRSA PCR Screening     Status: Abnormal   Collection Time: 08/16/17  8:28 PM  Result Value Ref Range Status   MRSA by PCR POSITIVE (A) NEGATIVE Final    Comment:        The GeneXpert MRSA Assay (FDA approved for NASAL specimens only), is one component of a comprehensive MRSA colonization surveillance program. It is not intended to diagnose MRSA infection nor to guide or monitor treatment for MRSA infections. RESULT CALLED TO, READ BACK BY AND VERIFIED WITH: CRYSTAL BASS AT 2146 08/16/17.PMH     Coagulation Studies: No results for input(s): LABPROT, INR in the last 72 hours.  Urinalysis: No results for input(s): COLORURINE, LABSPEC, PHURINE, GLUCOSEU, HGBUR, BILIRUBINUR, KETONESUR, PROTEINUR, UROBILINOGEN, NITRITE, LEUKOCYTESUR in the last 72 hours.  Invalid input(s): APPERANCEUR    Imaging: No results found.   Medications:    . amLODipine  5 mg Oral Daily  . Chlorhexidine Gluconate Cloth  6 each Topical Q0600  . divalproex  250 mg  Oral TID  . epoetin (EPOGEN/PROCRIT) injection  10,000 Units Intravenous Q M,W,F-HD  . gabapentin  100 mg Oral BID  . lidocaine-prilocaine   Topical Q M,W,F  . multivitamin with minerals  1 tablet Oral Daily  . OLANZapine  20 mg Oral QHS  . OLANZapine  5 mg Oral Daily  . pantoprazole  40 mg Oral Daily  . temazepam  15 mg Oral QHS  . thiamine  50 mg Oral Daily   acetaminophen, alum & mag hydroxide-simeth, senna  Assessment/ Plan:  [name] is a 68 y.o. black female with end-stage renal disease, bipolar disorder, hypertension was admitted on 12/25/2018 with manic symptoms.   MWF [facility] Nephrology [facility]   1.  End-stage renal disease: Patient seen and evaluated during hemodialysis.  Currently tolerating well.  2.  Anemia of chronic kidney disease: Hemoglobin  currently 8.1.  Maintain the patient on Epogen 10,000 units IV with dialysis on Monday Wednesday Friday.  3.  Secondary hyperparathyroidism. -Awaiting new phosphorus today.  4. Hypertension: Continue amlodipine for blood pressure control.  5.  Hyperkalemia.  Potassium currently 5.7 and we are dialyzing the patient against a 2K bath today.   LOS: 13 Madison [name] 4/15/202011:45 AM

## 2019-01-07 NOTE — Progress Notes (Signed)
Patient was complaint with medication, no distress noted, on 1:1 sitter at bedside will continue to monitor

## 2019-01-07 NOTE — Progress Notes (Signed)
Sitter at bedside, patient in bed with eyes closed no distress noted, will continue to monitor

## 2019-01-07 NOTE — Progress Notes (Signed)
Pre HD [facility]    01/07/19 0850  Hand-Off documentation  Report given to (Full Name) Beatris Ship, RN   Report received from (Full Name) Ben Avon, [place] RN   Vital Signs  Temp 98.1 F (36.7 C)  Temp Source Oral  Pulse Rate 64  Pulse Rate Source Monitor  Resp 14  BP (!) 141/75  BP Location Right Arm  BP Method Automatic  Patient Position (if appropriate) Sitting  Oxygen Therapy  SpO2 98 %  O2 Device Room Air  Pulse Oximetry Type Continuous  Pain Assessment  Pain Scale 0-10  Pain Score 0  Dialysis Weight  Weight  (unable to weigh in recliner )  Type of Weight Pre-Dialysis  Time-Out for Hemodialysis  What Procedure? HD  Pt Identifiers(min of two) First/Last Name;MRN/Account#  Correct Site? Yes  Correct Side? Yes  Correct Procedure? Yes  Consents Verified? Yes  Rad Studies Available? Yes  Safety Precautions Reviewed? Yes  Engineer, civil (consulting) Number 2  Station Number 3  UF/Alarm Test Passed  Conductivity: Meter 14  Conductivity: Machine  14.1  pH 7.2  Reverse Osmosis Main  Normal Saline Lot Number [id number]  Dialyzer Lot Number [id number]  Disposable Set Lot Number [id number]  Machine Temperature 98.6 F (37 C)  Musician and Audible Yes  Blood Lines Intact and Secured Yes  Pre Treatment Patient Checks  Vascular access used during treatment Fistula  Patient is receiving dialysis in a chair Yes  Hepatitis B Surface Antigen Results Negative  Date Hepatitis B Surface Antigen Drawn 12/29/18  Hepatitis B Surface Antibody  (<10)  Date Hepatitis B Surface Antibody Drawn 10/18/18  Hemodialysis Consent Verified Yes  Hemodialysis Standing Orders Initiated Yes  ECG (Telemetry) Monitor On Yes  Prime Ordered Normal Saline  Length of  DialysisTreatment -hour(s) 3.5 Hour(s)  Dialysis Treatment Comments Na 140  Dialyzer Elisio 17H NR  Dialysate 2K, 2.5 Ca  Dialysis Anticoagulant None  Dialysate Flow Ordered 800  Blood Flow Rate Ordered 400 mL/min  Ultrafiltration Goal 1.5  Liters  Pre Treatment Labs Phosphorus  Dialysis Blood Pressure Support Ordered Normal Saline  Education / Care Plan  Dialysis Education Provided Yes  Documented Education in Care Plan Yes

## 2019-01-07 NOTE — Progress Notes (Signed)
7am - 11am  D: Patient  Remains to have a sitter present . Patient also continue to have jerk like movement  With body and arms . Appropriate ADL'S and personal chores .  Marland Kitchen  Continue to talk the entire time  With 1:1 .  A: Encourage patient participation with unit programming . Patient left for Dialysis at 9:00 R: Voice no other concerns. Staff continue to monitor

## 2019-01-07 NOTE — Progress Notes (Addendum)
4-7pm D: Patient remain to have 1:1 at bedside Appetite good at dinner  Blood pressure 149/87 automatic retake manual 140/74 Dr. Weber Cooks called  Informed  Of blood pressure Patient quiet voice no other concerns .  Periods of coughing  During shift   Bruit /thrill present

## 2019-01-07 NOTE — Progress Notes (Signed)
Recreation Therapy Notes  Date: 01/07/2019  Time: 9:30 am   Location: Craft room   Behavioral response: N/A   Intervention Topic: Relaxation    Discussion/Intervention: Patient did not attend group.   Clinical Observations/Feedback:  Patient did not attend group.   Liliya Fullenwider LRT/CTRS        Jasson Siegmann 01/07/2019 12:05 PM

## 2019-01-07 NOTE — Progress Notes (Signed)
HD Tx started   01/07/19 0900  Vital Signs  Pulse Rate 63  Pulse Rate Source Monitor  Resp 14  BP 133/74  BP Location Right Arm  BP Method Automatic  Patient Position (if appropriate) Sitting  Oxygen Therapy  SpO2 98 %  O2 Device Room Air  Pain Assessment  Pain Scale 0-10  Pain Score 0  During Hemodialysis Assessment  Blood Flow Rate (mL/min) 400 mL/min  Arterial Pressure (mmHg) -100 mmHg  Venous Pressure (mmHg) 260 mmHg  Transmembrane Pressure (mmHg) 50 mmHg  Ultrafiltration Rate (mL/min) 520 mL/min  Dialysate Flow Rate (mL/min) 800 ml/min  Conductivity: Machine  14.1  HD Safety Checks Performed Yes  Dialysis Fluid Bolus Normal Saline  Bolus Amount (mL) 250 mL  Intra-Hemodialysis Comments Tx initiated

## 2019-01-07 NOTE — Progress Notes (Signed)
HD Tx completed, tolerated well, uf goal met.    01/07/19 1227  Vital Signs  Pulse Rate 85  Pulse Rate Source Monitor  Resp 16  BP 103/64  BP Location Right Arm  BP Method Automatic  Patient Position (if appropriate) Sitting  Oxygen Therapy  SpO2 100 %  O2 Device Room Air  Pulse Oximetry Type Continuous  During Hemodialysis Assessment  HD Safety Checks Performed Yes  KECN 67.1 KECN  Dialysis Fluid Bolus Normal Saline  Bolus Amount (mL) 250 mL  Intra-Hemodialysis Comments Tx completed;Tolerated well

## 2019-01-08 DIAGNOSIS — F312 Bipolar disorder, current episode manic severe with psychotic features: Secondary | ICD-10-CM | POA: Diagnosis not present

## 2019-01-08 MED ORDER — SENNA 8.6 MG PO TABS: 1.0000 | ORAL_TABLET | Freq: Every evening | ORAL | 1 refills | Status: DC | PRN

## 2019-01-08 MED ORDER — OLANZAPINE 20 MG PO TABS: 20.0000 mg | ORAL_TABLET | Freq: Every day | ORAL | 1 refills | Status: DC

## 2019-01-08 MED ORDER — ADULT MULTIVITAMIN W/MINERALS CH: 1.0000 | ORAL_TABLET | Freq: Every day | ORAL | 0 refills | Status: DC

## 2019-01-08 MED ORDER — VITAMIN B-1 100 MG PO TABS: 50.0000 mg | ORAL_TABLET | Freq: Every day | ORAL | 0 refills | Status: DC

## 2019-01-08 MED ORDER — CYANOCOBALAMIN 1000 MCG PO TABS: 1000.0000 ug | ORAL_TABLET | Freq: Every day | ORAL | 1 refills | Status: DC

## 2019-01-08 MED ORDER — VITAMIN B-12 1000 MCG PO TABS
1000.0000 ug | ORAL_TABLET | Freq: Every day | ORAL | Status: DC
  Administered 2019-01-08 – 2019-01-09 (×2): 1000 ug via ORAL
  Filled 2019-01-08 (×2): qty 1

## 2019-01-08 MED ORDER — SODIUM ZIRCONIUM CYCLOSILICATE 10 G PO PACK
10.0000 g | PACK | Freq: Every day | ORAL | Status: DC
  Administered 2019-01-08: 10 g via ORAL
  Filled 2019-01-08: qty 1

## 2019-01-08 MED ORDER — AMLODIPINE BESYLATE 5 MG PO TABS: 5.0000 mg | ORAL_TABLET | Freq: Every day | ORAL | 0 refills | Status: DC

## 2019-01-08 MED ORDER — OLANZAPINE 5 MG PO TABS: 5.0000 mg | ORAL_TABLET | Freq: Every day | ORAL | 1 refills | Status: DC

## 2019-01-08 MED ORDER — BENZONATATE 100 MG PO CAPS
100.0000 mg | ORAL_CAPSULE | Freq: Four times a day (QID) | ORAL | Status: DC | PRN
  Administered 2019-01-08 – 2019-01-09 (×3): 100 mg via ORAL
  Filled 2019-01-08 (×5): qty 1

## 2019-01-08 MED ORDER — DIVALPROEX SODIUM 250 MG PO DR TAB: 250.0000 mg | DELAYED_RELEASE_TABLET | Freq: Three times a day (TID) | ORAL | 1 refills | Status: DC

## 2019-01-08 MED ORDER — EPOETIN ALFA 10000 UNIT/ML IJ SOLN: 10000.0000 [IU] | INTRAMUSCULAR | 1 refills | Status: DC

## 2019-01-08 MED ORDER — TEMAZEPAM 15 MG PO CAPS: 15.0000 mg | ORAL_CAPSULE | Freq: Every day | ORAL | 1 refills | Status: DC

## 2019-01-08 MED ORDER — PANTOPRAZOLE SODIUM 40 MG PO TBEC: 40.0000 mg | DELAYED_RELEASE_TABLET | Freq: Every day | ORAL | 0 refills | Status: DC

## 2019-01-08 MED ORDER — GABAPENTIN 100 MG PO CAPS: 100.0000 mg | ORAL_CAPSULE | Freq: Two times a day (BID) | ORAL | 1 refills | Status: DC

## 2019-01-08 NOTE — Progress Notes (Signed)
Patient remains  on 1:1  Observation,  up using bathroom. Will continue to monitor for safety.

## 2019-01-08 NOTE — Progress Notes (Signed)
Patient remain to have 1:1, cal cooperative and took all medications.

## 2019-01-08 NOTE — Progress Notes (Signed)
Patient attended social work group and did participate. Patient did well and demonstrates some self control.

## 2019-01-08 NOTE — Progress Notes (Signed)
This afternoon patient decided to stay in her room, away from peers, patient states, " I am tired of these jive turnkeys talking in front of my face, I just need to stay away from the crowd and just maybe I can shut my mouth and go home tomorrow." patient is pleasant and enjoyed RN sitting with her today. Patient exhibit no harmful behaviors. Patient has been cooperative and able to follow directions. Patient has thanked Therapist, sports many times today for sitting and listening and offering support.  Patient continues to have 1:1 sitter for fall risk.

## 2019-01-08 NOTE — BHH Suicide Risk Assessment (Signed)
[facility] Discharge Suicide Risk Assessment   Principal Problem: Bipolar affective disorder, current episode manic with psychotic symptoms [facility] Discharge Diagnoses: Principal Problem:   Bipolar affective disorder, current episode manic with psychotic symptoms ([place]) Active Problems:   End stage renal disease ([place])   Schizoaffective disorder, bipolar type ([place])   Total Time spent with patient: 45 minutes  Musculoskeletal: Strength & Muscle Tone: within normal limits Gait & Station: normal Patient leans: N/A  Psychiatric Specialty Exam: Review of Systems  Constitutional: Negative.   HENT: Negative.   Eyes: Negative.   Respiratory: Negative.   Cardiovascular: Negative.   Gastrointestinal: Negative.   Musculoskeletal: Negative.   Skin: Negative.   Neurological: Negative.   Psychiatric/Behavioral: Negative for depression, hallucinations, memory loss, substance abuse and suicidal ideas. The patient is not nervous/anxious and does not have insomnia.     Blood pressure (!) 148/86, pulse 96, temperature 98.4 F (36.9 C), temperature source Oral, resp. rate 18, height 5\' 4"  (1.626 m), weight 60.4 kg, SpO2 98 %.Body mass index is 22.86 kg/m.  General Appearance: Casual  Eye Contact::  Good  Speech:  Clear and Coherent409  Volume:  Normal  Mood:  Irritable  Affect:  Appropriate  Thought Process:  Coherent  Orientation:  Full (Time, Place, and Person)  Thought Content:  Logical, Rumination and Tangential  Suicidal Thoughts:  No  Homicidal Thoughts:  No  Memory:  Immediate;   Fair Recent;   Fair Remote;   Fair  Judgement:  Fair  Insight:  Fair  Psychomotor Activity:  Normal  Concentration:  Fair  Recall:  [employer] of Knowledge:Fair  Language: Fair  Akathisia:  No  Handed:  Right  AIMS (if indicated):     Assets:  Communication Skills Desire for Improvement Resilience Social Support  Sleep:  Number of Hours: 5.75  Cognition: Impaired,  Mild  ADL's:  Intact   Mental  Status Per Nursing Assessment::   On Admission:  NA  Demographic Factors:  Age 31 or older  Loss Factors: Loss of significant relationship  Historical Factors: Impulsivity  Risk Reduction Factors:   Sense of responsibility to family, Religious beliefs about death, Living with another person, especially a relative, Positive social support and Positive therapeutic relationship  Continued Clinical Symptoms:  Bipolar Disorder:   Mixed State Schizophrenia:   Paranoid or undifferentiated type  Cognitive Features That Contribute To Risk:  Loss of executive function    Suicide Risk:  Minimal: No identifiable suicidal ideation.  Patients presenting with no risk factors but with morbid ruminations; may be classified as minimal risk based on the severity of the depressive symptoms  [place] Follow up on 01/20/2019.   Why:  You have an appointment scheduled with Dr. Darleene Cleaver on Tuesday 01/20/2019 at 10:15 AM. Thank you! Contact information: [address] [place] [address] [phone]           Plan Of Care/Follow-up recommendations:  Activity:  Activity as tolerated back at home. Diet:  Patient stays on a renal diet Other:  Follow-up with dialysis in her home community and continue current medication with outpatient follow-up  Alethia Berthold, MD 01/08/2019, 10:43 AM

## 2019-01-08 NOTE — Progress Notes (Signed)
[facility] MD Progress Note  01/08/2019 10:25 AM KRYSTIE LEITER  MRN:  [id number] Subjective: Patient seen and chart reviewed.  Patient is complaining of a mild but slightly irritating cough today.  No sign of a fever no shortness of breath no other symptoms of respiratory illness.  Otherwise she seems to be doing significantly better.  She is still tangential but her chatter is not constant and she is not overly intrusive.  She seems more lucid in her talk.  She still gets irritated easily but does not fly off the handle.  She is no longer having the tremulousness or unsteadiness that she appeared to develop late in the weekend.  She is appearing to tolerate her current medicine without difficulty.  Neatly dressed and groomed.  No sign of any acute dangerousness. Principal Problem: Bipolar affective disorder, current episode manic with psychotic symptoms ([place]) Diagnosis: Principal Problem:   Bipolar affective disorder, current episode manic with psychotic symptoms ([place]) Active Problems:   End stage renal disease ([place])   Schizoaffective disorder, bipolar type ([place])  Total Time spent with patient: 30 minutes  Past Psychiatric History: Patient has a long history of bipolar disorder back into her youth.  Had been relatively stable until recently when she seems to have decompensated after the death of her mother.  Past Medical History:  Past Medical History:  Diagnosis Date  . Anxiety   . ESRD (end stage renal disease) on dialysis ([place])   . Neuropathy   . Schizophrenia [facility])     Past Surgical History:  Procedure Laterality Date  . CHOLECYSTECTOMY    . COLONOSCOPY N/A 01/20/2016   Dr. Oneida Alar: 12 mm tubular adenoma removed from the transverse colon, 4 hyperplastic polyps removed from the rectum and sigmoid colon.  Next colonoscopy planned for April 2020.  Marland Kitchen ESOPHAGOGASTRODUODENOSCOPY N/A 12/19/2017   Procedure: ESOPHAGOGASTRODUODENOSCOPY (EGD);  Surgeon: [name], MD;  Location: [facility] END[facility];   Service: Endoscopy;  Laterality: N/A;  8:30am  . fistula left arm    . GIVENS CAPSULE STUDY N/A 01/14/2018   Procedure: GIVENS CAPSULE STUDY;  Surgeon: [name], MD;  Location: [facility] ENDO SUITE;  Service: Endoscopy;  Laterality: N/A;  7:30am  . HEMICOLECTOMY Right   . TUBAL LIGATION     Family History:  Family History  Problem Relation Age of Onset  . Alzheimer's disease Mother   . Cancer Mother        pancreatic  . Prostate cancer Father   . Kidney failure Sister   . Breast cancer Neg Hx   . Colon cancer Neg Hx    Family Psychiatric  History: Had a twin sister who also suffered from severe mental illness Social History:  Social History   Substance and Sexual Activity  Alcohol Use No     Social History   Substance and Sexual Activity  Drug Use No    Social History   Socioeconomic History  . Marital status: Single    Spouse name: Not on file  . Number of children: Not on file  . Years of education: Not on file  . Highest education level: Not on file  Occupational History  . Not on file  Social Needs  . Financial resource strain: Not on file  . Food insecurity:    Worry: Not on file    Inability: Not on file  . Transportation needs:    Medical: Not on file    Non-medical: Not on file  Tobacco Use  .  Smoking status: Never Smoker  . Smokeless tobacco: Never Used  Substance and Sexual Activity  . Alcohol use: No  . Drug use: No  . Sexual activity: Yes  Lifestyle  . Physical activity:    Days per week: Not on file    Minutes per session: Not on file  . Stress: Not on file  Relationships  . Social connections:    Talks on phone: Not on file    Gets together: Not on file    Attends religious service: Not on file    Active member of club or organization: Not on file    Attends meetings of clubs or organizations: Not on file    Relationship status: Not on file  Other Topics Concern  . Not on file  Social History Narrative  . Not on file   Additional  Social History:                         Sleep: Fair  Appetite:  Fair  Current Medications: Current Facility-Administered Medications  Medication Dose Route Frequency Provider Last Rate Last Dose  . acetaminophen (TYLENOL) tablet 650 mg  650 mg Oral Q6H PRN Lagena Strand, Madie Reno, MD   650 mg at 01/03/19 2053  . alum & mag hydroxide-simeth (MAALOX/MYLANTA) 200-200-20 MG/5ML suspension 30 mL  30 mL Oral Q4H PRN Thomspon, Geni Bers, NP      . amLODipine (NORVASC) tablet 5 mg  5 mg Oral Daily Kolluru, Sarath, MD   5 mg at 01/08/19 0715  . benzonatate (TESSALON) capsule 100 mg  100 mg Oral Q6H PRN Jamelle Goldston T, MD      . Chlorhexidine Gluconate Cloth 2 % PADS 6 each  6 each Topical Q0600 Murlean Iba, MD   6 each at 01/06/19 0545  . divalproex (DEPAKOTE) DR tablet 250 mg  250 mg Oral TID Chauncey Mann, MD   250 mg at 01/08/19 0715  . epoetin alfa (EPOGEN,PROCRIT) injection 10,000 Units  10,000 Units Intravenous Q M,W,F-HD Kolluru, Sarath, MD   10,000 Units at 01/07/19 1200  . gabapentin (NEURONTIN) capsule 100 mg  100 mg Oral BID Chauncey Mann, MD   100 mg at 01/08/19 0715  . lidocaine-prilocaine (EMLA) cream   Topical Q M,W,F Murlean Iba, MD      . multivitamin with minerals tablet 1 tablet  1 tablet Oral Daily Nyle Limb, Madie Reno, MD   1 tablet at 01/08/19 0715  . OLANZapine (ZYPREXA) tablet 20 mg  20 mg Oral QHS Sangeeta Youse, Madie Reno, MD   20 mg at 01/07/19 2145  . OLANZapine (ZYPREXA) tablet 5 mg  5 mg Oral Daily Ladislao Cohenour, Madie Reno, MD   5 mg at 01/08/19 0715  . pantoprazole (PROTONIX) EC tablet 40 mg  40 mg Oral Daily Tamrah Victorino, Madie Reno, MD   40 mg at 01/08/19 0715  . senna (SENOKOT) tablet 8.6-17.2 mg  1-2 tablet Oral QHS PRN Lamont Dowdy, NP   17.2 mg at 12/29/18 2226  . temazepam (RESTORIL) capsule 15 mg  15 mg Oral QHS Akane Tessier, Madie Reno, MD   15 mg at 01/07/19 2145  . thiamine (VITAMIN B-1) tablet 50 mg  50 mg Oral Daily Angello Chien, Madie Reno, MD   50 mg at 01/08/19 0715    Lab Results:   Results for orders placed or performed during the hospital encounter of 12/25/18 (from the past 48 hour(s))  Basic metabolic panel     Status: Abnormal   Collection  Time: 01/06/19  8:52 PM  Result Value Ref Range   Sodium 137 135 - 145 mmol/L   Potassium 5.7 (H) 3.5 - 5.1 mmol/L   Chloride 98 98 - 111 mmol/L   CO2 25 22 - 32 mmol/L   Glucose, Bld 85 70 - 99 mg/dL   BUN 78 (H) 8 - 23 mg/dL   Creatinine, Ser 8.00 (H) 0.44 - 1.00 mg/dL   Calcium 9.3 8.9 - 10.3 mg/dL   GFR calc non Af Amer 5 (L) >60 mL/min   GFR calc Af Amer 5 (L) >60 mL/min   Anion gap 14 5 - 15    Comment: Performed at [facility], [address] [place], [place] [address]  Magnesium     Status: None   Collection Time: 01/06/19  8:52 PM  Result Value Ref Range   Magnesium 2.0 1.7 - 2.4 mg/dL    Comment: Performed at [facility], [address] [place], [place] [address]    Blood Alcohol level:  Lab Results  Component Value Date   [facility] <10 12/28/2018   ETH <5 02/40/9735    Metabolic Disorder Labs: No results found for: HGBA1C, MPG No results found for: PROLACTIN No results found for: CHOL, TRIG, HDL, CHOLHDL, VLDL, LDLCALC  Physical Findings: AIMS:  , ,  ,  ,    CIWA:    COWS:     Musculoskeletal: Strength & Muscle Tone: within normal limits Gait & Station: normal Patient leans: N/A  Psychiatric Specialty Exam: Physical Exam  Nursing note and vitals reviewed. Constitutional: She appears well-developed and well-nourished.  HENT:  Head: Normocephalic and atraumatic.  Eyes: Pupils are equal, round, and reactive to light. Conjunctivae are normal.  Neck: Normal range of motion.  Cardiovascular: Regular rhythm and normal heart sounds.  Respiratory: Effort normal. No respiratory distress.  GI: Soft.  Musculoskeletal: Normal range of motion.  Neurological: She is alert.  Skin: Skin is warm and dry.  Psychiatric: She has a normal mood and affect. Thought content normal. Her  speech is delayed and tangential. She is slowed. Cognition and memory are impaired. She expresses impulsivity. She exhibits abnormal recent memory.    Review of Systems  Constitutional: Negative.   HENT: Negative.   Eyes: Negative.   Respiratory: Positive for cough.   Cardiovascular: Negative.   Gastrointestinal: Negative.   Musculoskeletal: Negative.   Skin: Negative.   Neurological: Negative.   Psychiatric/Behavioral: Negative for depression, hallucinations, memory loss, substance abuse and suicidal ideas. The patient is not nervous/anxious and does not have insomnia.     Blood pressure (!) 148/86, pulse 96, temperature 98.4 F (36.9 C), temperature source Oral, resp. rate 18, height 5\' 4"  (1.626 m), weight 60.4 kg, SpO2 98 %.Body mass index is 22.86 kg/m.  General Appearance: Casual  Eye Contact:  Fair  Speech:  Clear and Coherent  Volume:  Normal  Mood:  Irritable  Affect:  Appropriate  Thought Process:  Disorganized  Orientation:  Full (Time, Place, and Person)  Thought Content:  Logical, Rumination and Tangential  Suicidal Thoughts:  No  Homicidal Thoughts:  No  Memory:  Immediate;   Fair Recent;   Fair Remote;   Poor  Judgement:  Fair  Insight:  Fair  Psychomotor Activity:  Normal  Concentration:  Concentration: Fair  Recall:  [employer] of Knowledge:  Fair  Language:  Fair  Akathisia:  No  Handed:  Right  AIMS (if indicated):     Assets:  Desire  for Improvement Housing Resilience Social Support  ADL's:  Intact  Cognition:  Impaired,  Mild  Sleep:  Number of Hours: 5.75     Treatment Plan Summary: Daily contact with patient to assess and evaluate symptoms and progress in treatment, Medication management and Plan Patient seen chart reviewed.  She seems to be doing significantly better.  Still has some disorganized thought but it is my understanding that even at her baseline she was not always perfectly lucid.  The agitation and tendency towards dangerous and  impulsive behavior seems much improved.  She appears to be tolerating medicine without difficulty.  I think we can safely plan on discharge.  I have proposed to the patient that we plan for discharge tomorrow after dialysis.  Patient is disappointed in this and would rather be discharged today although I do not think we have made the arrangements with her family.  I will try to call her cousin's Cherlyn Cushing to see what might be possible from the family standpoint.  Also the patient's cough will be treated with a modest dose of Tessalon Perles which should be a safe intervention for her.  Alethia Berthold, MD 01/08/2019, 10:25 AM

## 2019-01-08 NOTE — Progress Notes (Signed)
Recreation Therapy Notes  Date: 01/08/2019  Time: 9:30 am   Location: Craft room   Behavioral response: N/A   Intervention Topic: Communication  Discussion/Intervention: Patient did not attend group.   Clinical Observations/Feedback:  Patient did not attend group.   Frieda Arnall LRT/CTRS        Florita Nitsch 01/08/2019 10:50 AM

## 2019-01-08 NOTE — Plan of Care (Signed)
Patient has improved in behaviors, speech is not as rapid and pressured as in previous days. Patient enjoys going out in community room interacting with others, patient tries to demonstrate self control. Patient is optimistic about possible discharge for tomorrow. Problem: Education: Goal: Knowledge of [place] [facility] Education information/materials will improve Outcome: Progressing   Problem: Coping: Goal: Ability to verbalize frustrations and anger appropriately will improve Outcome: Progressing Goal: Ability to demonstrate self-control will improve Outcome: Progressing   Problem: Safety: Goal: Periods of time without injury will increase Outcome: Progressing   Problem: Health Behavior/Discharge Planning: Goal: Compliance with prescribed medication regimen will improve Outcome: Progressing   Problem: Self-Care: Goal: Ability to participate in self-care as condition permits will improve Outcome: Progressing

## 2019-01-08 NOTE — Progress Notes (Signed)
Patient is alert X 3, denies SI,HI and AVH. This morning patient was found in the dayroom listening to music, waiting for breakfast to arrive.  Patient took all of her morning medicine. After breakfast arrived patient went back to her room and continued to talk to RN about her sisters and growing up. Patient is a fall risk and RN will sitting with patient for today. Safety maintained.

## 2019-01-08 NOTE — Progress Notes (Signed)
Patient is alert and oriented. Denies pain, SI,HI and AVH. Patient took a nap and then talked on the telephone to a close friend. Patient remains compliant with medications. Patient remains safe. RN continues to be Air cabin crew for patient.

## 2019-01-08 NOTE — Progress Notes (Signed)
Patient remains  on 1:1 and currently resting well in bed with respiration noted.

## 2019-01-08 NOTE — [facility] Group Notes (Signed)
LCSW Group Therapy Note  01/08/2019 1:00 PM  Type of Therapy/Topic:  Group Therapy:  Balance in Life  Participation Level:  Minimal  Description of Group:    This group will address the concept of balance and how it feels and looks when one is unbalanced. Patients will be encouraged to process areas in their lives that are out of balance and identify reasons for remaining unbalanced. Facilitators will guide patients in utilizing problem-solving interventions to address and correct the stressor making their life unbalanced. Understanding and applying boundaries will be explored and addressed for obtaining and maintaining a balanced life. Patients will be encouraged to explore ways to assertively make their unbalanced needs known to significant others in their lives, using other group members and facilitator for support and feedback.  Therapeutic Goals: 1. Patient will identify two or more emotions or situations they have that consume much of in their lives. 2. Patient will identify signs/triggers that life has become out of balance:  3. Patient will identify two ways to set boundaries in order to achieve balance in their lives:  4. Patient will demonstrate ability to communicate their needs through discussion and/or role plays  Summary of Patient Progress: Patient was present at the beginning of group. Patient participated in icebreaker activity and shared negative emotions associated with being unbalanced.  Patient left group early, however.    Therapeutic Modalities:   Cognitive Behavioral Therapy Solution-Focused Therapy Assertiveness Training  Assunta Curtis MSW, LCSW 01/08/2019 12:00 PM

## 2019-01-08 NOTE — Progress Notes (Signed)
[facility]  ROUNDING NOTE   Subjective:  Patient seen at bedside. Appears to be in good spirits. Due for dialysis again tomorrow.   Objective:  Vital signs in last 24 hours:  Temp:  [98.1 F (36.7 C)-98.4 F (36.9 C)] 98.4 F (36.9 C) (04/16 0755) Pulse Rate:  [96-106] 96 (04/16 0755) Resp:  [18] 18 (04/16 0755) BP: (140-157)/(74-87) 148/86 (04/16 0755) SpO2:  [98 %-100 %] 98 % (04/16 0755)  Weight change:  Filed Weights    Intake/Output: I/O last 3 completed shifts: In: -  Out: 1509 [Other:1509]   Intake/Output this shift:  No intake/output data recorded.  Physical Exam: General: NAD  Head: Normocephalic, atraumatic. Moist oral mucosal membranes  Eyes: Anicteric  Neck: Supple, trachea midline  Lungs:  Clear to auscultation  Heart: Regular rate and rhythm  Abdomen:  Soft, nontender, BS present  Extremities: no peripheral edema.  Neurologic: Awake, alert  Skin: No lesions  Access: Left AVG    Basic Metabolic Panel: Recent Labs  Lab 01/03/19 0641 01/05/19 0707 01/06/19 2052  NA 136 135 137  K 5.7* 6.2* 5.7*  CL 98 98 98  CO2 28 24 25   GLUCOSE 107* 84 85  BUN 52* 99* 78*  CREATININE 5.49* 9.19* 8.00*  CALCIUM 9.6 9.1 9.3  MG  --   --  2.0  PHOS 3.7  --   --     Liver Function Tests: Recent Labs  Lab 01/03/19 0641 01/05/19 0707  AST  --  25  ALT  --  16  ALKPHOS  --  60  BILITOT  --  0.7  PROT  --  6.4*  ALBUMIN 3.2* 3.1*   No results for input(s): LIPASE, AMYLASE in the last 168 hours. Recent Labs  Lab 01/05/19 0707  AMMONIA 22    CBC: Recent Labs  Lab 01/03/19 0641 01/05/19 0707  WBC 5.6 7.7  NEUTROABS  --  5.7  HGB 8.2* 8.1*  HCT 25.6* 25.0*  MCV 96.2 95.1  PLT 170 176    Cardiac Enzymes: No results for input(s): CKTOTAL, CKMB, CKMBINDEX, TROPONINI in the last 168 hours.  BNP: Invalid input(s): POCBNP  CBG: No results for input(s): GLUCAP in the last 168 hours.  Microbiology: Results for orders  placed or performed during the hospital encounter of 08/15/17  MRSA PCR Screening     Status: Abnormal   Collection Time: 08/16/17  8:28 PM  Result Value Ref Range Status   MRSA by PCR POSITIVE (A) NEGATIVE Final    Comment:        The GeneXpert MRSA Assay (FDA approved for NASAL specimens only), is one component of a comprehensive MRSA colonization surveillance program. It is not intended to diagnose MRSA infection nor to guide or monitor treatment for MRSA infections. RESULT CALLED TO, READ BACK BY AND VERIFIED WITH: CRYSTAL BASS AT 2146 08/16/17.PMH     Coagulation Studies: No results for input(s): LABPROT, INR in the last 72 hours.  Urinalysis: No results for input(s): COLORURINE, LABSPEC, PHURINE, GLUCOSEU, HGBUR, BILIRUBINUR, KETONESUR, PROTEINUR, UROBILINOGEN, NITRITE, LEUKOCYTESUR in the last 72 hours.  Invalid input(s): APPERANCEUR    Imaging: No results found.   Medications:    . amLODipine  5 mg Oral Daily  . Chlorhexidine Gluconate Cloth  6 each Topical Q0600  . divalproex  250 mg Oral TID  . epoetin (EPOGEN/PROCRIT) injection  10,000 Units Intravenous Q M,W,F-HD  . gabapentin  100 mg Oral BID  . lidocaine-prilocaine   Topical Q  M,W,F  . multivitamin with minerals  1 tablet Oral Daily  . OLANZapine  20 mg Oral QHS  . OLANZapine  5 mg Oral Daily  . pantoprazole  40 mg Oral Daily  . temazepam  15 mg Oral QHS  . thiamine  50 mg Oral Daily  . vitamin B-12  1,000 mcg Oral Daily   acetaminophen, alum & mag hydroxide-simeth, benzonatate, senna  Assessment/ Plan:  [name] is a 68 y.o. black female with end-stage renal disease, bipolar disorder, hypertension was admitted on 12/25/2018 with manic symptoms.   MWF [facility]phrology [facility]   1.  End-stage renal disease: Patient due for dialysis treatment tomorrow.  Orders will be prepared.  2.  Anemia of chronic kidney disease: Continue Epogen 10,000 units IV with dialysis.  3.   Secondary hyperparathyroidism. -Phosphorus 3.7 and at target at last check.  4. Hypertension: Continue amlodipine for blood pressure control.  5.  Hyperkalemia.  Patient continues to have low-grade hyperkalemia.  We will add Lokelma 10 g p.o. daily to her medication regimen.   LOS: 14 [name] 4/16/20203:24 PM

## 2019-01-09 DIAGNOSIS — F312 Bipolar disorder, current episode manic severe with psychotic features: Secondary | ICD-10-CM | POA: Diagnosis not present

## 2019-01-09 MED ORDER — SODIUM ZIRCONIUM CYCLOSILICATE 10 G PO PACK: 10.0000 g | PACK | Freq: Every day | ORAL | 0 refills | Status: DC

## 2019-01-09 MED ORDER — OLANZAPINE 5 MG PO TABS: 5.0000 mg | ORAL_TABLET | Freq: Every day | ORAL | 0 refills | Status: DC

## 2019-01-09 MED ORDER — OLANZAPINE 20 MG PO TABS: 20.0000 mg | ORAL_TABLET | Freq: Every day | ORAL | 0 refills | Status: DC

## 2019-01-09 MED ORDER — DIVALPROEX SODIUM 250 MG PO DR TAB: 250.0000 mg | DELAYED_RELEASE_TABLET | Freq: Three times a day (TID) | ORAL | 0 refills | Status: DC

## 2019-01-09 NOTE — Tx Team (Signed)
Interdisciplinary Treatment and Diagnostic Plan Update  01/09/2019 Time of Session: 830AM Madison [name] MRN: [id number]  Principal Diagnosis: Bipolar affective disorder, current episode manic with psychotic symptoms ([place])  Secondary Diagnoses: Principal Problem:   Bipolar affective disorder, current episode manic with psychotic symptoms ([place]) Active Problems:   End stage renal disease ([place])   Schizoaffective disorder, bipolar type ([place])   Current Medications:  Current Facility-Administered Medications  Medication Dose Route Frequency Provider Last Rate Last Dose  . acetaminophen (TYLENOL) tablet 650 mg  650 mg Oral Q6H PRN Clapacs, Madie Reno, MD   650 mg at 01/03/19 2053  . alum & mag hydroxide-simeth (MAALOX/MYLANTA) 200-200-20 MG/5ML suspension 30 mL  30 mL Oral Q4H PRN Thomspon, Geni Bers, NP      . amLODipine (NORVASC) tablet 5 mg  5 mg Oral Daily Kolluru, Sarath, MD   5 mg at 01/08/19 0715  . benzonatate (TESSALON) capsule 100 mg  100 mg Oral Q6H PRN Clapacs, Madie Reno, MD   100 mg at 01/08/19 2359  . Chlorhexidine Gluconate Cloth 2 % PADS 6 each  6 each Topical Q0600 Murlean Iba, MD   6 each at 01/06/19 0545  . divalproex (DEPAKOTE) DR tablet 250 mg  250 mg Oral TID Chauncey Mann, MD   250 mg at 01/08/19 1721  . epoetin alfa (EPOGEN,PROCRIT) injection 10,000 Units  10,000 Units Intravenous Q M,W,F-HD Kolluru, Sarath, MD   10,000 Units at 01/07/19 1200  . gabapentin (NEURONTIN) capsule 100 mg  100 mg Oral BID Chauncey Mann, MD   100 mg at 01/08/19 1721  . lidocaine-prilocaine (EMLA) cream   Topical Q M,W,F Murlean Iba, MD      . multivitamin with minerals tablet 1 tablet  1 tablet Oral Daily Clapacs, Madie Reno, MD   1 tablet at 01/08/19 0715  . OLANZapine (ZYPREXA) tablet 20 mg  20 mg Oral QHS Clapacs, John T, MD   20 mg at 01/08/19 2225  . OLANZapine (ZYPREXA) tablet 5 mg  5 mg Oral Daily Clapacs, Madie Reno, MD   5 mg at 01/08/19 0715  . pantoprazole (PROTONIX) EC tablet 40 mg  40  mg Oral Daily Clapacs, Madie Reno, MD   40 mg at 01/08/19 0715  . senna (SENOKOT) tablet 8.6-17.2 mg  1-2 tablet Oral QHS PRN Lamont Dowdy, NP   17.2 mg at 12/29/18 2226  . sodium zirconium cyclosilicate (LOKELMA) packet 10 g  10 g Oral Daily Lateef, Munsoor, MD   10 g at 01/08/19 1603  . temazepam (RESTORIL) capsule 15 mg  15 mg Oral QHS Clapacs, Madie Reno, MD   15 mg at 01/08/19 2226  . thiamine (VITAMIN B-1) tablet 50 mg  50 mg Oral Daily Clapacs, Madie Reno, MD   50 mg at 01/08/19 0715  . vitamin B-12 (CYANOCOBALAMIN) tablet 1,000 mcg  1,000 mcg Oral Daily Clapacs, Madie Reno, MD   1,000 mcg at 01/08/19 1202   PTA Medications: Medications Prior to Admission  Medication Sig Dispense Refill Last Dose  . ferric citrate (AURYXIA) 1 GM 210 MG(Fe) tablet Take 210 mg by mouth 3 (three) times daily with meals.    Taking  . gabapentin (NEURONTIN) 300 MG capsule Take 300 mg by mouth at bedtime.    Taking  . metroNIDAZOLE (FLAGYL) 500 MG tablet Take 1 tablet (500 mg total) by mouth 2 (two) times daily. 14 tablet 1   . mirtazapine (REMERON) 30 MG tablet Take 30 mg by mouth at bedtime.  Taking  . Multiple Vitamin (MULTIVITAMIN) tablet Take 1 tablet by mouth daily.   Taking  . OLANZapine (ZYPREXA) 15 MG tablet Take 15 mg by mouth at bedtime.    Taking  . pantoprazole (PROTONIX) 40 MG tablet TAKE 1 TABLET BY MOUTH TWICE DAILY 30 MINUTES BEFORE MEALS FOR 3 MONTHS THEN DECREASE TO 1 TABLET DAILY. 56 tablet 3 Taking  . [DISCONTINUED] thiamine (VITAMIN B-1) 100 MG tablet Take 50 mg by mouth daily.    Taking    Patient Stressors: Health problems Medication change or noncompliance  Patient Strengths: Active sense of humor Supportive family/friends  Treatment Modalities: Medication Management, Group therapy, Case management,  1 to 1 session with clinician, Psychoeducation, Recreational therapy.   Physician Treatment Plan for Primary Diagnosis: Bipolar affective disorder, current episode manic with psychotic  symptoms ([place]) Long Term Goal(s): Improvement in symptoms so as ready for discharge Improvement in symptoms so as ready for discharge   Short Term Goals: Ability to verbalize feelings will improve Ability to demonstrate self-control will improve Ability to identify and develop effective coping behaviors will improve Ability to maintain clinical measurements within normal limits will improve Compliance with prescribed medications will improve  Medication Management: Evaluate patient's response, side effects, and tolerance of medication regimen.  Therapeutic Interventions: 1 to 1 sessions, Unit Group sessions and Medication administration.  Evaluation of Outcomes: Adequate for Discharge  Physician Treatment Plan for Secondary Diagnosis: Principal Problem:   Bipolar affective disorder, current episode manic with psychotic symptoms ([place]) Active Problems:   End stage renal disease ([place])   Schizoaffective disorder, bipolar type ([place])  Long Term Goal(s): Improvement in symptoms so as ready for discharge Improvement in symptoms so as ready for discharge   Short Term Goals: Ability to verbalize feelings will improve Ability to demonstrate self-control will improve Ability to identify and develop effective coping behaviors will improve Ability to maintain clinical measurements within normal limits will improve Compliance with prescribed medications will improve     Medication Management: Evaluate patient's response, side effects, and tolerance of medication regimen.  Therapeutic Interventions: 1 to 1 sessions, Unit Group sessions and Medication administration.  Evaluation of Outcomes: Adequate for Discharge   RN Treatment Plan for Primary Diagnosis: Bipolar affective disorder, current episode manic with psychotic symptoms ([place]) Long Term Goal(s): Knowledge of disease and therapeutic regimen to maintain health will improve  Short Term Goals: Ability to verbalize frustration and anger  appropriately will improve and Ability to participate in decision making will improve  Medication Management: RN will administer medications as ordered by provider, will assess and evaluate patient's response and provide education to patient for prescribed medication. RN will report any adverse and/or side effects to prescribing provider.  Therapeutic Interventions: 1 on 1 counseling sessions, Psychoeducation, Medication administration, Evaluate responses to treatment, Monitor vital signs and CBGs as ordered, Perform/monitor CIWA, COWS, AIMS and Fall Risk screenings as ordered, Perform wound care treatments as ordered.  Evaluation of Outcomes: Adequate for Discharge   LCSW Treatment Plan for Primary Diagnosis: Bipolar affective disorder, current episode manic with psychotic symptoms ([place]) Long Term Goal(s): Safe transition to appropriate next level of care at discharge, Engage patient in therapeutic group addressing interpersonal concerns.  Short Term Goals: Engage patient in aftercare planning with referrals and resources, Facilitate acceptance of mental health diagnosis and concerns and Increase skills for wellness and recovery  Therapeutic Interventions: Assess for all discharge needs, 1 to 1 time with Social worker, Explore available resources and support systems, Assess for adequacy  in community support network, Educate family and significant other(s) on suicide prevention, Complete Psychosocial Assessment, Interpersonal group therapy.  Evaluation of Outcomes: Adequate for Discharge   Progress in Treatment: Attending groups: Yes. Participating in groups: Yes. Taking medication as prescribed: Yes. Toleration medication: Yes. Family/Significant other contact made: Yes, individual(s) contacted:  Pts cousin Gaynell and pts sister Lelan Pons Patient understands diagnosis: Yes. Discussing patient identified problems/goals with staff: Yes. Medical problems stabilized or resolved: Yes. Denies  suicidal/homicidal ideation: Yes. Issues/concerns per patient self-inventory: No. Other: N/A  New problem(s) identified: No, Describe:  none  New Short Term/Long Term Goal(s): medication management for mood stabilization;  development of comprehensive mental wellness/sobriety plan.   Patient Goals:  "going home"  Discharge Plan or Barriers: SPE pamphlet, Mobile Crisis information, and AA/NA information provided to patient for additional community support and resources. Pt has an appointment with her psychiatrist Dr. Darleene Cleaver on 01/20/2019 at 10:15 AM.  Reason for Continuation of Hospitalization: Medication stabilization  Estimated Length of Stay: Today 01/09/2019  Recreational Therapy: Patient Stressors: N/A Patient Goal: Patient will focus on task/topic with 2 prompts from staff within 5 recreation therapy group sessions  Attendees: Patient: Madison [name] 01/09/2019 8:07 AM  Physician: 01/09/2019 8:07 AM  Nursing:  01/09/2019 8:07 AM  RN Care Manager: 01/09/2019 8:07 AM  Social Worker: Amory Simonetti LCSW 01/09/2019 8:07 AM  Recreational Therapist:  01/09/2019 8:07 AM  Other:  01/09/2019 8:07 AM  Other:  01/09/2019 8:07 AM  Other: 01/09/2019 8:07 AM    Scribe for Treatment Team: Mariann Laster Kelso Bibby, LCSW 01/09/2019 8:07 AM

## 2019-01-09 NOTE — Progress Notes (Signed)
Pre hemodialysis report received from Marigene Ehlers, RN. Received patient in RDU via wheelchair with 1:1 companion.  Awake, alert and verbally responsive. No actute distress noted. + Bruit/thrill noted to LUE AVF. Accessed with two 15 g needles without difficulty. Hemodialysis treatment initiated at 1417 via AVF. Plan for 3.5  hours hemodialysis with UF of 1.5 L as tolerated.    01/09/19 1417  Hand-Off documentation  Report received from (Full Name) Collier Bullock, RN  Vital Signs  Temp 98.2 F (36.8 C)  Temp Source Oral  Pulse Rate 96  Resp 20  BP (!) 162/70  BP Location Right Arm  BP Method Automatic  Patient Position (if appropriate) Sitting  Oxygen Therapy  SpO2 98 %  O2 Device Room Air  Pain Assessment  Pain Scale 0-10  Pain Score 0  During Hemodialysis Assessment  Blood Flow Rate (mL/min) 400 mL/min  Arterial Pressure (mmHg) -150 mmHg  Venous Pressure (mmHg) 150 mmHg  Transmembrane Pressure (mmHg) 60 mmHg  Ultrafiltration Rate (mL/min) 570 mL/min  Dialysate Flow Rate (mL/min) 600 ml/min  Conductivity: Machine  14  HD Safety Checks Performed Yes  Dialysis Fluid Bolus Normal Saline  Bolus Amount (mL) 250 mL (prime)  Intra-Hemodialysis Comments Tx initiated

## 2019-01-09 NOTE — Plan of Care (Signed)
  Problem: Health Behavior/Discharge Planning: Goal: Compliance with prescribed medication regimen will improve Outcome: Progressing  Patient is compliant with medication

## 2019-01-09 NOTE — Plan of Care (Signed)
Pt denies hallucinations, depression, anxiety and suicidal/homicidal ideations. PT has a goal of "trying to make it home." PT stated she "slept well". Pt was educated on plan of care and verbalizes understanding. Collier Bullock RN Problem: Education: Goal: Knowledge of [place] [facility] Education information/materials will improve Outcome: Progressing   Problem: Coping: Goal: Ability to verbalize frustrations and anger appropriately will improve Outcome: Progressing Goal: Ability to demonstrate self-control will improve Outcome: Progressing   Problem: Safety: Goal: Periods of time without injury will increase Outcome: Progressing   Problem: Health Behavior/Discharge Planning: Goal: Compliance with prescribed medication regimen will improve Outcome: Progressing   Problem: Self-Care: Goal: Ability to participate in self-care as condition permits will improve Outcome: Progressing   Problem: Education: Goal: Ability to state activities that reduce stress will improve Outcome: Progressing   Problem: Coping: Goal: Ability to identify and develop effective coping behavior will improve Outcome: Progressing   Problem: Self-Concept: Goal: Ability to identify factors that promote anxiety will improve Outcome: Progressing Goal: Level of anxiety will decrease Outcome: Progressing Goal: Ability to modify response to factors that promote anxiety will improve Outcome: Progressing

## 2019-01-09 NOTE — Progress Notes (Signed)
Recreation Therapy Notes  INPATIENT RECREATION TR PLAN  Patient Details Name: Madison [name] MRN: [id number] DOB: 22-Mar-1951 Today's Date: 01/09/2019  Rec Therapy Plan Is patient appropriate for Therapeutic Recreation?: Yes Treatment times per week: At least 3 Estimated Length of Stay: 5-7 days TR Treatment/Interventions: Group participation (Comment)  Discharge Criteria Pt will be discharged from therapy if:: Discharged Treatment plan/goals/alternatives discussed and agreed upon by:: Patient/family  Discharge Summary Short term goals set: Patient will focus on task/topic with 2 prompts from staff within 5 recreation therapy group sessions Short term goals met: Adequate for discharge Progress toward goals comments: Groups attended Which groups?: Stress management, Other (Comment)(Happiness) Reason goals not met: N/A Therapeutic equipment acquired: N/A Reason patient discharged from therapy: Discharge from hospital Pt/family agrees with progress & goals achieved: Yes Date patient discharged from therapy: 01/09/19   Judit Awad 01/09/2019, 11:24 AM

## 2019-01-09 NOTE — Progress Notes (Signed)
Patient continues to be on 1:1, sitter at bedisde no distress noted, she is in bed with eyes closed. 15 minutes safety checks maintained will continue to monitor.

## 2019-01-09 NOTE — Discharge Summary (Signed)
Physician Discharge Summary Note  Patient:  Madison [name] is an 68 y.o., female MRN:  [id number] DOB:  20-Sep-1951 Patient phone:  [phone] (home)  Patient address:   [place] [address],  Total Time spent with patient: 45 minutes  Date of Admission:  12/25/2018 Date of Discharge: January 09, 2019  Reason for Admission: Patient was sent to [place] from [facility] for admission because of her need for hemodialysis.  This is a patient with chronic hemodialysis who has a history of bipolar or schizoaffective disorder who had decompensated recently with manic symptoms that made her unmanageable at home  Principal Problem: Bipolar affective disorder, current episode manic with psychotic symptoms [facility]) Discharge Diagnoses: Principal Problem:   Bipolar affective disorder, current episode manic with psychotic symptoms ([place]) Active Problems:   End stage renal disease ([place])   Schizoaffective disorder, bipolar type ([place])   Past Psychiatric History: Patient has a history of chronic mental health problems going back to her adolescent years.  Has had multiple hospitalizations in the past.  Past Medical History:  Past Medical History:  Diagnosis Date  . Anxiety   . ESRD (end stage renal disease) on dialysis ([place])   . Neuropathy   . Schizophrenia [facility])     Past Surgical History:  Procedure Laterality Date  . CHOLECYSTECTOMY    . COLONOSCOPY N/A 01/20/2016   Dr. Oneida Alar: 12 mm tubular adenoma removed from the transverse colon, 4 hyperplastic polyps removed from the rectum and sigmoid colon.  Next colonoscopy planned for April 2020.  Marland Kitchen ESOPHAGOGASTRODUODENOSCOPY N/A 12/19/2017   Procedure: ESOPHAGOGASTRODUODENOSCOPY (EGD);  Surgeon: [name], MD;  Location: [facility] ENDO SUITE;  Service: Endoscopy;  Laterality: N/A;  8:30am  . fistula left arm    . GIVENS CAPSULE STUDY N/A 01/14/2018   Procedure: GIVENS CAPSULE STUDY;  Surgeon: [name], MD;  Location: [facility] ENDO SUITE;  Service:  Endoscopy;  Laterality: N/A;  7:30am  . HEMICOLECTOMY Right   . TUBAL LIGATION     Family History:  Family History  Problem Relation Age of Onset  . Alzheimer's disease Mother   . Cancer Mother        pancreatic  . Prostate cancer Father   . Kidney failure Sister   . Breast cancer Neg Hx   . Colon cancer Neg Hx    Family Psychiatric  History: She had a twin sister who had similar problems. Social History:  Social History   Substance and Sexual Activity  Alcohol Use No     Social History   Substance and Sexual Activity  Drug Use No    Social History   Socioeconomic History  . Marital status: Single    Spouse name: Not on file  . Number of children: Not on file  . Years of education: Not on file  . Highest education level: Not on file  Occupational History  . Not on file  Social Needs  . Financial resource strain: Not on file  . Food insecurity:    Worry: Not on file    Inability: Not on file  . Transportation needs:    Medical: Not on file    Non-medical: Not on file  Tobacco Use  . Smoking status: Never Smoker  . Smokeless tobacco: Never Used  Substance and Sexual Activity  . Alcohol use: No  . Drug use: No  . Sexual activity: Yes  Lifestyle  . Physical activity:    Days per week: Not on file  Minutes per session: Not on file  . Stress: Not on file  Relationships  . Social connections:    Talks on phone: Not on file    Gets together: Not on file    Attends religious service: Not on file    Active member of club or organization: Not on file    Attends meetings of clubs or organizations: Not on file    Relationship status: Not on file  Other Topics Concern  . Not on file  Social History Narrative  . Not on file    Hospital Course: Patient was admitted to the psychiatric ward.  15-minute checks in place.  In addition the patient frequently required a one-to-one just to make sure that she was safe and steady on her feet.  She is on chronic  hemodialysis and at times can seem a little unsteady but did not suffer any significant falls during her time in the hospital.  Patient was noted to be hyperverbal and disorganized on admission.  After review of her old chart she was started back on Zyprexa with the dose increased slightly and low dose of Depakote.  This past weekend she had a transient period of seeming more unsteady and jittery but medicines were gently decreased and she is now back to being steady on her feet.  Patient still is a little confused and agitated but not nearly as hyperverbal or intrusive as she was when she first got here.  She absolutely denies any suicidal ideation.  I have been in touch with her sisters and her cousin while she is here.  I have gotten collateral information and made arrangements for discharge.  Patient will be discharged today after her hemodialysis session.  She had dialysis throughout her time in the hospital without any difficulty.  She will follow-up with her usual dialysis back in [facility] and will follow up with outpatient mental health agencies in [place].  Prescriptions written.  Physical Findings: AIMS:  , ,  ,  ,    CIWA:    COWS:     Musculoskeletal: Strength & Muscle Tone: within normal limits Gait & Station: normal Patient leans: N/A  Psychiatric Specialty Exam: Physical Exam  Nursing note and vitals reviewed. Constitutional: She appears well-developed and well-nourished.  HENT:  Head: Normocephalic and atraumatic.  Eyes: Pupils are equal, round, and reactive to light. Conjunctivae are normal.  Neck: Normal range of motion.  Cardiovascular: Regular rhythm and normal heart sounds.  Respiratory: Effort normal.  GI: Soft.  Musculoskeletal: Normal range of motion.  Neurological: She is alert.  Skin: Skin is warm and dry.  Psychiatric: Her affect is blunt. Her speech is tangential. She is not agitated. Thought content is not paranoid. Cognition and memory are impaired.  She expresses impulsivity. She expresses no homicidal and no suicidal ideation.    Review of Systems  Constitutional: Negative.   HENT: Negative.   Eyes: Negative.   Respiratory: Negative.   Cardiovascular: Negative.   Gastrointestinal: Negative.   Musculoskeletal: Negative.   Skin: Negative.   Neurological: Negative.   Psychiatric/Behavioral: Negative.     Blood pressure (!) 148/72, pulse (!) 103, temperature 98.9 F (37.2 C), temperature source Oral, resp. rate 16, height 5\' 4"  (1.626 m), weight 60.4 kg, SpO2 99 %.Body mass index is 22.86 kg/m.  General Appearance: Casual  Eye Contact:  Good  Speech:  Clear and Coherent  Volume:  Normal  Mood:  Euthymic  Affect:  Constricted  Thought Process:  Disorganized  Orientation:  Negative  Thought Content:  Rumination  Suicidal Thoughts:  No  Homicidal Thoughts:  No  Memory:  Immediate;   Fair Recent;   Poor Remote;   Poor  Judgement:  Impaired  Insight:  Shallow  Psychomotor Activity:  Restlessness  Concentration:  Concentration: Fair  Recall:  [place] of Knowledge:  Fair  Language:  Fair  Akathisia:  No  Handed:  Right  AIMS (if indicated):     Assets:  Desire for Improvement Financial Resources/Insurance Housing Resilience Social Support  ADL's:  Intact  Cognition:  Impaired,  Mild  Sleep:  Number of Hours: 6.15     Have you used any form of tobacco in the last 30 days? (Cigarettes, Smokeless Tobacco, Cigars, and/or Pipes): No  Has this patient used any form of tobacco in the last 30 days? (Cigarettes, Smokeless Tobacco, Cigars, and/or Pipes) Yes, No  Blood Alcohol level:  Lab Results  Component Value Date   ETH <10 12/31/2018   ETH <5 20/25/4270    Metabolic Disorder Labs:  No results found for: HGBA1C, MPG No results found for: PROLACTIN No results found for: CHOL, TRIG, HDL, CHOLHDL, VLDL, LDLCALC  See Psychiatric Specialty Exam and Suicide Risk Assessment completed by Attending Physician prior to  discharge.  Discharge destination:  Home  Is patient on multiple antipsychotic therapies at discharge:  No   Has Patient had three or more failed trials of antipsychotic monotherapy by history:  No  Recommended Plan for Multiple Antipsychotic Therapies: NA  Discharge Instructions    Diet - low sodium heart healthy   Complete by:  As directed    Diet - low sodium heart healthy   Complete by:  As directed    Increase activity slowly   Complete by:  As directed    Increase activity slowly   Complete by:  As directed      Allergies as of 01/09/2019   No Known Allergies     Medication List    STOP taking these medications   metroNIDAZOLE 500 MG tablet Commonly known as:  FLAGYL   mirtazapine 30 MG tablet Commonly known as:  REMERON     TAKE these medications     Indication  amLODipine 5 MG tablet Commonly known as:  NORVASC Take 1 tablet (5 mg total) by mouth daily.  Indication:  High Blood Pressure Disorder   Auryxia 1 GM 210 MG(Fe) tablet Generic drug:  ferric citrate Take 210 mg by mouth 3 (three) times daily with meals.  Indication:  Anemia From Inadequate Iron in the Body   cyanocobalamin 1000 MCG tablet Take 1 tablet (1,000 mcg total) by mouth daily.  Indication:  Inadequate Vitamin B12   divalproex 250 MG DR tablet Commonly known as:  DEPAKOTE Take 1 tablet (250 mg total) by mouth 3 (three) times daily.  Indication:  Manic Phase of Manic-Depression   epoetin alfa 10000 UNIT/ML injection Commonly known as:  EPOGEN Inject 1 mL (10,000 Units total) into the vein every Monday, Wednesday, and Friday with hemodialysis.  Indication:  Anemia associated with Chronic Kidney Failure, Anemia due to Kidney Failure, This medicine is normally administered along with the patient's hemodialysis so to avoid confusion I am not printing a paper prescription for it   gabapentin 100 MG capsule Commonly known as:  NEURONTIN Take 1 capsule (100 mg total) by mouth 2 (two) times  daily. What changed:    medication strength  how much to take  when to take  this  Indication:  Fibromyalgia Syndrome   multivitamin with minerals Tabs tablet Take 1 tablet by mouth daily.  Indication:  Vitamin and/or Mineral Deficiency   OLANZapine 20 MG tablet Commonly known as:  ZYPREXA Take 1 tablet (20 mg total) by mouth at bedtime. What changed:    medication strength  how much to take  Indication:  Manic Phase of Manic-Depression   OLANZapine 5 MG tablet Commonly known as:  ZYPREXA Take 1 tablet (5 mg total) by mouth daily. What changed:  You were already taking a medication with the same name, and this prescription was added. Make sure you understand how and when to take each.  Indication:  Manic Phase of Manic-Depression   pantoprazole 40 MG tablet Commonly known as:  PROTONIX Take 1 tablet (40 mg total) by mouth daily. What changed:    how much to take  how to take this  when to take this  additional instructions  Indication:  Gastroesophageal Reflux Disease   senna 8.6 MG Tabs tablet Commonly known as:  SENOKOT Take 1-2 tablets (8.6-17.2 mg total) by mouth at bedtime as needed for mild constipation or moderate constipation.  Indication:  Constipation   sodium zirconium cyclosilicate 10 g Pack packet Commonly known as:  LOKELMA Take 10 g by mouth daily.  Indication:  High Amount of Potassium in the Blood   temazepam 15 MG capsule Commonly known as:  RESTORIL Take 1 capsule (15 mg total) by mouth at bedtime.  Indication:  Trouble Sleeping   thiamine 100 MG tablet Commonly known as:  VITAMIN B-1 Take 0.5 tablets (50 mg total) by mouth daily.  Indication:  Deficiency of [place] Follow up on 01/20/2019.   Why:  You have an appointment scheduled with Dr. Darleene Cleaver on Tuesday 01/20/2019 at 10:15 AM. Thank you! Contact information: [address] [place]  [address] [phone]           Follow-up recommendations:  Activity:  Activity as tolerated Diet:  Regular diet Other:  Follow-up with outpatient mental health agencies continue current medicine.  Prescriptions provided as well as a brief 7-day supply to allow her to transition on her medicine.  Comments: Discharge patient today.  Family aware.  We are awaiting hemodialysis but everything else should be in order.  Signed: Alethia Berthold, MD 01/09/2019, 2:14 PM

## 2019-01-09 NOTE — Progress Notes (Signed)
Pt was discharged to her sister [name]. I reviewed all dc instructions with the sister and pt. They verbalized understanding. Packet had AVS, transition record, suicide risk assessment and prescriptions. PT denied SI, HI, hallucinations, depression and anxiety. Water engineer

## 2019-01-09 NOTE — Progress Notes (Signed)
Recreation Therapy Notes   Date: 01/09/2019  Time: 9:30 am   Location: Craft room   Behavioral response: N/A   Intervention Topic: Emotions  Discussion/Intervention: Patient did not attend group.   Clinical Observations/Feedback:  Patient did not attend group.   Lisaann Atha LRT/CTRS        Cyanne Delmar 01/09/2019 10:56 AM

## 2019-01-09 NOTE — Progress Notes (Signed)
BP was rechecked twice after daily BP med was given after she got back from dialysis. Dr. Weber Cooks made aware. I paged him and he called right back. He said that she was fine to still  DC because she has her BP meds given. Collier Bullock RN

## 2019-01-09 NOTE — Progress Notes (Signed)
Patient's condition unchanged, she continues to be on 1:1 will continue to monitor.

## 2019-01-09 NOTE — Progress Notes (Signed)
Patient on 1:1 no distress noted, affect is blunted, mood is pleasant, thoughts are organized, she has a sitter at bedside for safety. Patient denies SI/HI/AVH, she isolated to her room this evening, she was complaint with her medication this evening and she was receptive to staff. 15 minutes safety checks maintained will continue to monitor

## 2019-01-09 NOTE — BHH Counselor (Signed)
Feelings Around Relapse 01/09/2019 1PM  Type of Therapy and Topic:  Group Therapy:  Feelings around Relapse and Recovery  Participation Level:  Minimal   Description of Group:    Patients in this group will discuss emotions they experience before and after a relapse. They will process how experiencing these feelings, or avoidance of experiencing them, relates to having a relapse. Facilitator will guide patients to explore emotions they have related to recovery. Patients will be encouraged to process which emotions are more powerful. They will be guided to discuss the emotional reaction significant others in their lives may have to patients' relapse or recovery. Patients will be assisted in exploring ways to respond to the emotions of others without this contributing to a relapse.  Therapeutic Goals: 1. Patient will identify two or more emotions that lead to a relapse for them 2. Patient will identify two emotions that result when they relapse 3. Patient will identify two emotions related to recovery 4. Patient will demonstrate ability to communicate their needs through discussion and/or role plays   Summary of Patient Progress:  Pt came to group but left early. Pt required some redirection when she got off topic. Minimal participation but respected boundaries of group.   Therapeutic Modalities:   Cognitive Behavioral Therapy Solution-Focused Therapy Assertiveness Training Relapse Prevention Therapy   Yvette Rack, LCSW 01/09/2019 2:17 PM

## 2019-01-09 NOTE — Progress Notes (Signed)
  [facility] Adult Case Management Discharge Plan :  Will you be returning to the same living situation after discharge:  Yes,  home At discharge, do you have transportation home?: Yes,  family member Do you have the ability to pay for your medications: Yes,  Medicare and Medicaid insurance   Release of information consent forms completed and in the chart;    Patient to Follow up at: [facility] Follow up on 01/20/2019.   Why:  You have an appointment scheduled with Dr. Darleene Cleaver on Tuesday 01/20/2019 at 10:15 AM. Thank you! Contact information: [place] [address] [phone]           Next level of care provider has access to [place] and Suicide Prevention discussed: Yes,  SPE completed with pts cousin  Have you used any form of tobacco in the last 30 days? (Cigarettes, Smokeless Tobacco, Cigars, and/or Pipes): No  Has patient been referred to the Quitline?: N/A patient is not a smoker  Patient has been referred for addiction treatment: N/A  Delfin Edis, LCSW 01/09/2019, 9:00 AM

## 2019-01-09 NOTE — Progress Notes (Signed)
[facility]  ROUNDING NOTE   Subjective:  Patient due for hemodialysis today. Resting comfortably at the moment.   Objective:  Vital signs in last 24 hours:  Temp:  [98.6 F (37 C)-98.9 F (37.2 C)] 98.9 F (37.2 C) (04/17 0610) Pulse Rate:  [98-103] 103 (04/17 0610) Resp:  [16-18] 16 (04/17 0610) BP: (140-148)/(72-82) 148/72 (04/17 0610) SpO2:  [99 %] 99 % (04/17 0610)  Weight change:  Filed Weights    Intake/Output: I/O last 3 completed shifts: In: -  Out: 1 [Stool:1]   Intake/Output this shift:  No intake/output data recorded.  Physical Exam: General: NAD  Head: Normocephalic, atraumatic. Moist oral mucosal membranes  Eyes: Anicteric  Neck: Supple, trachea midline  Lungs:  Clear to auscultation  Heart: Regular rate and rhythm  Abdomen:  Soft, nontender, BS present  Extremities: no peripheral edema.  Neurologic: Awake, alert  Skin: No lesions  Access: Left AVG    Basic Metabolic Panel: Recent Labs  Lab 01/03/19 0641 01/05/19 0707 01/06/19 2052  NA 136 135 137  K 5.7* 6.2* 5.7*  CL 98 98 98  CO2 28 24 25   GLUCOSE 107* 84 85  BUN 52* 99* 78*  CREATININE 5.49* 9.19* 8.00*  CALCIUM 9.6 9.1 9.3  MG  --   --  2.0  PHOS 3.7  --   --     Liver Function Tests: Recent Labs  Lab 01/03/19 0641 01/05/19 0707  AST  --  25  ALT  --  16  ALKPHOS  --  60  BILITOT  --  0.7  PROT  --  6.4*  ALBUMIN 3.2* 3.1*   No results for input(s): LIPASE, AMYLASE in the last 168 hours. Recent Labs  Lab 01/05/19 0707  AMMONIA 22    CBC: Recent Labs  Lab 01/03/19 0641 01/05/19 0707  WBC 5.6 7.7  NEUTROABS  --  5.7  HGB 8.2* 8.1*  HCT 25.6* 25.0*  MCV 96.2 95.1  PLT 170 176    Cardiac Enzymes: No results for input(s): CKTOTAL, CKMB, CKMBINDEX, TROPONINI in the last 168 hours.  BNP: Invalid input(s): POCBNP  CBG: No results for input(s): GLUCAP in the last 168 hours.  Microbiology: Results for orders placed or performed during the  hospital encounter of 08/15/17  MRSA PCR Screening     Status: Abnormal   Collection Time: 08/16/17  8:28 PM  Result Value Ref Range Status   MRSA by PCR POSITIVE (A) NEGATIVE Final    Comment:        The GeneXpert MRSA Assay (FDA approved for NASAL specimens only), is one component of a comprehensive MRSA colonization surveillance program. It is not intended to diagnose MRSA infection nor to guide or monitor treatment for MRSA infections. RESULT CALLED TO, READ BACK BY AND VERIFIED WITH: CRYSTAL BASS AT 2146 08/16/17.PMH     Coagulation Studies: No results for input(s): LABPROT, INR in the last 72 hours.  Urinalysis: No results for input(s): COLORURINE, LABSPEC, PHURINE, GLUCOSEU, HGBUR, BILIRUBINUR, KETONESUR, PROTEINUR, UROBILINOGEN, NITRITE, LEUKOCYTESUR in the last 72 hours.  Invalid input(s): APPERANCEUR    Imaging: No results found.   Medications:    . amLODipine  5 mg Oral Daily  . Chlorhexidine Gluconate Cloth  6 each Topical Q0600  . divalproex  250 mg Oral TID  . epoetin (EPOGEN/PROCRIT) injection  10,000 Units Intravenous Q M,W,F-HD  . gabapentin  100 mg Oral BID  . lidocaine-prilocaine   Topical Q M,W,F  . multivitamin with minerals  1 tablet Oral Daily  . OLANZapine  20 mg Oral QHS  . OLANZapine  5 mg Oral Daily  . pantoprazole  40 mg Oral Daily  . sodium zirconium cyclosilicate  10 g Oral Daily  . temazepam  15 mg Oral QHS  . thiamine  50 mg Oral Daily  . vitamin B-12  1,000 mcg Oral Daily   acetaminophen, alum & mag hydroxide-simeth, benzonatate, senna  Assessment/ Plan:  Madison [name] is a 68 y.o. black female with end-stage renal disease, bipolar disorder, hypertension was admitted on 12/25/2018 with manic symptoms.   MWF [facility] Nephrology [facility]   1.  End-stage renal disease: Patient due for dialysis treatment today.  Orders have been prepared.  2.  Anemia of chronic kidney maintain the patient on Epogen 10,000 units IV  with dialysis treatments.  Most recent hemoglobin was 8.1.  3.  Secondary hyperparathyroidism. -Repeat serum phosphorus today.  4. Hypertension: Continue amlodipine for blood pressure control.  5.  Hyperkalemia.  Okay to discontinue sodium zirconium 10 g daily upon discharge.   LOS: 15 Madison [name] 4/17/202011:41 AM

## 2019-01-09 NOTE — Progress Notes (Signed)
Hemodialysis treatment completed at 1747. Net UF removed 1.5 L. Blood rinsed back, Needles removed one at a time and manual pressure held to AVF until hemostasis achieved. No active bleeding upon patient departure from acute room. Post hemodialysis report given to M. Baxter Flattery,  RN.    01/09/19 1747  Hand-Off documentation  Report given to (Full Name) Collier Bullock, RN  Vital Signs  Temp 98.4 F (36.9 C)  Temp Source Oral  Pulse Rate 99  BP (!) 172/70  BP Location Right Arm  BP Method Automatic  Patient Position (if appropriate) Sitting  During Hemodialysis Assessment  Intra-Hemodialysis Comments Tx completed  Post-Hemodialysis Assessment  Rinseback Volume (mL) 250 mL  KECN 71.7 V  Dialyzer Clearance Lightly streaked  Duration of HD Treatment -hour(s) 3.5 hour(s)  Hemodialysis Intake (mL) 500 mL  UF Total -Machine (mL) 2000 mL  Net UF (mL) 1500 mL  Tolerated HD Treatment Yes  AVG/AVF Arterial Site Held (minutes) 15 minutes  AVG/AVF Venous Site Held (minutes) 15 minutes  Education / Care Plan  Dialysis Education Provided Yes  Documented Education in Care Plan Yes  Outpatient Plan of Care Reviewed and on Chart Yes

## 2019-01-12 DIAGNOSIS — N2581 Secondary hyperparathyroidism of renal origin: Secondary | ICD-10-CM | POA: Diagnosis not present

## 2019-01-12 DIAGNOSIS — D631 Anemia in chronic kidney disease: Secondary | ICD-10-CM | POA: Diagnosis not present

## 2019-01-12 DIAGNOSIS — N186 End stage renal disease: Secondary | ICD-10-CM | POA: Diagnosis not present

## 2019-01-14 DIAGNOSIS — D631 Anemia in chronic kidney disease: Secondary | ICD-10-CM | POA: Diagnosis not present

## 2019-01-14 DIAGNOSIS — N251 Nephrogenic diabetes insipidus: Secondary | ICD-10-CM | POA: Diagnosis not present

## 2019-01-14 DIAGNOSIS — N2581 Secondary hyperparathyroidism of renal origin: Secondary | ICD-10-CM | POA: Diagnosis not present

## 2019-01-14 DIAGNOSIS — N186 End stage renal disease: Secondary | ICD-10-CM | POA: Diagnosis not present

## 2019-01-16 DIAGNOSIS — D631 Anemia in chronic kidney disease: Secondary | ICD-10-CM | POA: Diagnosis not present

## 2019-01-16 DIAGNOSIS — N186 End stage renal disease: Secondary | ICD-10-CM | POA: Diagnosis not present

## 2019-01-16 DIAGNOSIS — N2581 Secondary hyperparathyroidism of renal origin: Secondary | ICD-10-CM | POA: Diagnosis not present

## 2019-01-18 ENCOUNTER — Encounter: Payer: Self-pay | Admitting: Obstetrics & Gynecology

## 2019-01-19 DIAGNOSIS — D631 Anemia in chronic kidney disease: Secondary | ICD-10-CM | POA: Diagnosis not present

## 2019-01-19 DIAGNOSIS — N186 End stage renal disease: Secondary | ICD-10-CM | POA: Diagnosis not present

## 2019-01-19 DIAGNOSIS — N2581 Secondary hyperparathyroidism of renal origin: Secondary | ICD-10-CM | POA: Diagnosis not present

## 2019-01-20 DIAGNOSIS — F411 Generalized anxiety disorder: Secondary | ICD-10-CM | POA: Diagnosis not present

## 2019-01-20 DIAGNOSIS — F250 Schizoaffective disorder, bipolar type: Secondary | ICD-10-CM | POA: Diagnosis not present

## 2019-01-21 DIAGNOSIS — D631 Anemia in chronic kidney disease: Secondary | ICD-10-CM | POA: Diagnosis not present

## 2019-01-21 DIAGNOSIS — N186 End stage renal disease: Secondary | ICD-10-CM | POA: Diagnosis not present

## 2019-01-21 DIAGNOSIS — N2581 Secondary hyperparathyroidism of renal origin: Secondary | ICD-10-CM | POA: Diagnosis not present

## 2019-01-22 DIAGNOSIS — Z992 Dependence on renal dialysis: Secondary | ICD-10-CM | POA: Diagnosis not present

## 2019-01-22 DIAGNOSIS — N186 End stage renal disease: Secondary | ICD-10-CM | POA: Diagnosis not present

## 2019-01-23 DIAGNOSIS — E875 Hyperkalemia: Secondary | ICD-10-CM | POA: Diagnosis not present

## 2019-01-23 DIAGNOSIS — D509 Iron deficiency anemia, unspecified: Secondary | ICD-10-CM | POA: Diagnosis not present

## 2019-01-23 DIAGNOSIS — N2581 Secondary hyperparathyroidism of renal origin: Secondary | ICD-10-CM | POA: Diagnosis not present

## 2019-01-23 DIAGNOSIS — 419620001 Death: Secondary | SNOMED CT | POA: Diagnosis not present

## 2019-01-23 DIAGNOSIS — N186 End stage renal disease: Secondary | ICD-10-CM | POA: Diagnosis not present

## 2019-01-23 DIAGNOSIS — D631 Anemia in chronic kidney disease: Secondary | ICD-10-CM | POA: Diagnosis not present

## 2019-01-23 DEATH — deceased

## 2019-01-26 DIAGNOSIS — D631 Anemia in chronic kidney disease: Secondary | ICD-10-CM | POA: Diagnosis not present

## 2019-01-26 DIAGNOSIS — D509 Iron deficiency anemia, unspecified: Secondary | ICD-10-CM | POA: Diagnosis not present

## 2019-01-26 DIAGNOSIS — N2581 Secondary hyperparathyroidism of renal origin: Secondary | ICD-10-CM | POA: Diagnosis not present

## 2019-01-26 DIAGNOSIS — E875 Hyperkalemia: Secondary | ICD-10-CM | POA: Diagnosis not present

## 2019-01-26 DIAGNOSIS — N186 End stage renal disease: Secondary | ICD-10-CM | POA: Diagnosis not present

## 2019-01-28 DIAGNOSIS — N2581 Secondary hyperparathyroidism of renal origin: Secondary | ICD-10-CM | POA: Diagnosis not present

## 2019-01-28 DIAGNOSIS — D631 Anemia in chronic kidney disease: Secondary | ICD-10-CM | POA: Diagnosis not present

## 2019-01-28 DIAGNOSIS — E875 Hyperkalemia: Secondary | ICD-10-CM | POA: Diagnosis not present

## 2019-01-28 DIAGNOSIS — D509 Iron deficiency anemia, unspecified: Secondary | ICD-10-CM | POA: Diagnosis not present

## 2019-01-28 DIAGNOSIS — N186 End stage renal disease: Secondary | ICD-10-CM | POA: Diagnosis not present

## 2019-01-30 DIAGNOSIS — N186 End stage renal disease: Secondary | ICD-10-CM | POA: Diagnosis not present

## 2019-01-30 DIAGNOSIS — N2581 Secondary hyperparathyroidism of renal origin: Secondary | ICD-10-CM | POA: Diagnosis not present

## 2019-01-30 DIAGNOSIS — D509 Iron deficiency anemia, unspecified: Secondary | ICD-10-CM | POA: Diagnosis not present

## 2019-01-30 DIAGNOSIS — E875 Hyperkalemia: Secondary | ICD-10-CM | POA: Diagnosis not present

## 2019-01-30 DIAGNOSIS — D631 Anemia in chronic kidney disease: Secondary | ICD-10-CM | POA: Diagnosis not present

## 2019-02-02 DIAGNOSIS — E875 Hyperkalemia: Secondary | ICD-10-CM | POA: Diagnosis not present

## 2019-02-02 DIAGNOSIS — D509 Iron deficiency anemia, unspecified: Secondary | ICD-10-CM | POA: Diagnosis not present

## 2019-02-02 DIAGNOSIS — N2581 Secondary hyperparathyroidism of renal origin: Secondary | ICD-10-CM | POA: Diagnosis not present

## 2019-02-02 DIAGNOSIS — D631 Anemia in chronic kidney disease: Secondary | ICD-10-CM | POA: Diagnosis not present

## 2019-02-02 DIAGNOSIS — N186 End stage renal disease: Secondary | ICD-10-CM | POA: Diagnosis not present

## 2019-02-04 ENCOUNTER — Other Ambulatory Visit: Payer: Self-pay | Admitting: Gastroenterology

## 2019-02-04 DIAGNOSIS — N186 End stage renal disease: Secondary | ICD-10-CM | POA: Diagnosis not present

## 2019-02-04 DIAGNOSIS — E875 Hyperkalemia: Secondary | ICD-10-CM | POA: Diagnosis not present

## 2019-02-04 DIAGNOSIS — D631 Anemia in chronic kidney disease: Secondary | ICD-10-CM | POA: Diagnosis not present

## 2019-02-04 DIAGNOSIS — D509 Iron deficiency anemia, unspecified: Secondary | ICD-10-CM | POA: Diagnosis not present

## 2019-02-04 DIAGNOSIS — N2581 Secondary hyperparathyroidism of renal origin: Secondary | ICD-10-CM | POA: Diagnosis not present

## 2019-02-06 DIAGNOSIS — D631 Anemia in chronic kidney disease: Secondary | ICD-10-CM | POA: Diagnosis not present

## 2019-02-06 DIAGNOSIS — D509 Iron deficiency anemia, unspecified: Secondary | ICD-10-CM | POA: Diagnosis not present

## 2019-02-06 DIAGNOSIS — N186 End stage renal disease: Secondary | ICD-10-CM | POA: Diagnosis not present

## 2019-02-06 DIAGNOSIS — N2581 Secondary hyperparathyroidism of renal origin: Secondary | ICD-10-CM | POA: Diagnosis not present

## 2019-02-06 DIAGNOSIS — E875 Hyperkalemia: Secondary | ICD-10-CM | POA: Diagnosis not present

## 2019-02-09 DIAGNOSIS — N2581 Secondary hyperparathyroidism of renal origin: Secondary | ICD-10-CM | POA: Diagnosis not present

## 2019-02-09 DIAGNOSIS — D509 Iron deficiency anemia, unspecified: Secondary | ICD-10-CM | POA: Diagnosis not present

## 2019-02-09 DIAGNOSIS — D631 Anemia in chronic kidney disease: Secondary | ICD-10-CM | POA: Diagnosis not present

## 2019-02-09 DIAGNOSIS — N186 End stage renal disease: Secondary | ICD-10-CM | POA: Diagnosis not present

## 2019-02-09 DIAGNOSIS — E875 Hyperkalemia: Secondary | ICD-10-CM | POA: Diagnosis not present

## 2019-02-11 DIAGNOSIS — D509 Iron deficiency anemia, unspecified: Secondary | ICD-10-CM | POA: Diagnosis not present

## 2019-02-11 DIAGNOSIS — N2581 Secondary hyperparathyroidism of renal origin: Secondary | ICD-10-CM | POA: Diagnosis not present

## 2019-02-11 DIAGNOSIS — N186 End stage renal disease: Secondary | ICD-10-CM | POA: Diagnosis not present

## 2019-02-11 DIAGNOSIS — E875 Hyperkalemia: Secondary | ICD-10-CM | POA: Diagnosis not present

## 2019-02-11 DIAGNOSIS — D631 Anemia in chronic kidney disease: Secondary | ICD-10-CM | POA: Diagnosis not present

## 2019-02-13 DIAGNOSIS — E875 Hyperkalemia: Secondary | ICD-10-CM | POA: Diagnosis not present

## 2019-02-13 DIAGNOSIS — N2581 Secondary hyperparathyroidism of renal origin: Secondary | ICD-10-CM | POA: Diagnosis not present

## 2019-02-13 DIAGNOSIS — N186 End stage renal disease: Secondary | ICD-10-CM | POA: Diagnosis not present

## 2019-02-13 DIAGNOSIS — D509 Iron deficiency anemia, unspecified: Secondary | ICD-10-CM | POA: Diagnosis not present

## 2019-02-13 DIAGNOSIS — D631 Anemia in chronic kidney disease: Secondary | ICD-10-CM | POA: Diagnosis not present

## 2019-02-16 DIAGNOSIS — N2581 Secondary hyperparathyroidism of renal origin: Secondary | ICD-10-CM | POA: Diagnosis not present

## 2019-02-16 DIAGNOSIS — D631 Anemia in chronic kidney disease: Secondary | ICD-10-CM | POA: Diagnosis not present

## 2019-02-16 DIAGNOSIS — E875 Hyperkalemia: Secondary | ICD-10-CM | POA: Diagnosis not present

## 2019-02-16 DIAGNOSIS — D509 Iron deficiency anemia, unspecified: Secondary | ICD-10-CM | POA: Diagnosis not present

## 2019-02-16 DIAGNOSIS — N186 End stage renal disease: Secondary | ICD-10-CM | POA: Diagnosis not present

## 2019-02-18 DIAGNOSIS — E875 Hyperkalemia: Secondary | ICD-10-CM | POA: Diagnosis not present

## 2019-02-18 DIAGNOSIS — N2581 Secondary hyperparathyroidism of renal origin: Secondary | ICD-10-CM | POA: Diagnosis not present

## 2019-02-18 DIAGNOSIS — D509 Iron deficiency anemia, unspecified: Secondary | ICD-10-CM | POA: Diagnosis not present

## 2019-02-18 DIAGNOSIS — N186 End stage renal disease: Secondary | ICD-10-CM | POA: Diagnosis not present

## 2019-02-18 DIAGNOSIS — D631 Anemia in chronic kidney disease: Secondary | ICD-10-CM | POA: Diagnosis not present

## 2019-02-20 DIAGNOSIS — D631 Anemia in chronic kidney disease: Secondary | ICD-10-CM | POA: Diagnosis not present

## 2019-02-20 DIAGNOSIS — E875 Hyperkalemia: Secondary | ICD-10-CM | POA: Diagnosis not present

## 2019-02-20 DIAGNOSIS — N186 End stage renal disease: Secondary | ICD-10-CM | POA: Diagnosis not present

## 2019-02-20 DIAGNOSIS — N2581 Secondary hyperparathyroidism of renal origin: Secondary | ICD-10-CM | POA: Diagnosis not present

## 2019-02-20 DIAGNOSIS — D509 Iron deficiency anemia, unspecified: Secondary | ICD-10-CM | POA: Diagnosis not present

## 2019-02-22 DIAGNOSIS — Z992 Dependence on renal dialysis: Secondary | ICD-10-CM | POA: Diagnosis not present

## 2019-02-22 DIAGNOSIS — N186 End stage renal disease: Secondary | ICD-10-CM | POA: Diagnosis not present

## 2019-02-23 DIAGNOSIS — N186 End stage renal disease: Secondary | ICD-10-CM | POA: Diagnosis not present

## 2019-02-23 DIAGNOSIS — D631 Anemia in chronic kidney disease: Secondary | ICD-10-CM | POA: Diagnosis not present

## 2019-02-23 DIAGNOSIS — N2581 Secondary hyperparathyroidism of renal origin: Secondary | ICD-10-CM | POA: Diagnosis not present

## 2019-02-23 DIAGNOSIS — D509 Iron deficiency anemia, unspecified: Secondary | ICD-10-CM | POA: Diagnosis not present

## 2019-02-25 DIAGNOSIS — N2581 Secondary hyperparathyroidism of renal origin: Secondary | ICD-10-CM | POA: Diagnosis not present

## 2019-02-25 DIAGNOSIS — D631 Anemia in chronic kidney disease: Secondary | ICD-10-CM | POA: Diagnosis not present

## 2019-02-25 DIAGNOSIS — D509 Iron deficiency anemia, unspecified: Secondary | ICD-10-CM | POA: Diagnosis not present

## 2019-02-25 DIAGNOSIS — N186 End stage renal disease: Secondary | ICD-10-CM | POA: Diagnosis not present

## 2019-02-27 DIAGNOSIS — D631 Anemia in chronic kidney disease: Secondary | ICD-10-CM | POA: Diagnosis not present

## 2019-02-27 DIAGNOSIS — N2581 Secondary hyperparathyroidism of renal origin: Secondary | ICD-10-CM | POA: Diagnosis not present

## 2019-02-27 DIAGNOSIS — D509 Iron deficiency anemia, unspecified: Secondary | ICD-10-CM | POA: Diagnosis not present

## 2019-02-27 DIAGNOSIS — N186 End stage renal disease: Secondary | ICD-10-CM | POA: Diagnosis not present

## 2019-03-02 ENCOUNTER — Telehealth: Payer: Self-pay | Admitting: Gastroenterology

## 2019-03-02 ENCOUNTER — Telehealth: Payer: Self-pay | Admitting: *Deleted

## 2019-03-02 DIAGNOSIS — D631 Anemia in chronic kidney disease: Secondary | ICD-10-CM | POA: Diagnosis not present

## 2019-03-02 DIAGNOSIS — N2581 Secondary hyperparathyroidism of renal origin: Secondary | ICD-10-CM | POA: Diagnosis not present

## 2019-03-02 DIAGNOSIS — N186 End stage renal disease: Secondary | ICD-10-CM | POA: Diagnosis not present

## 2019-03-02 DIAGNOSIS — D509 Iron deficiency anemia, unspecified: Secondary | ICD-10-CM | POA: Diagnosis not present

## 2019-03-02 NOTE — Telephone Encounter (Signed)
The patient's cousin, Darliss Ridgel, was returning a call from [facility] from this morning about scheduling patient a covid test. Please call her back at [phone]

## 2019-03-02 NOTE — Telephone Encounter (Signed)
Lmom for pt to call us back to schedule COVID 19 screening.

## 2019-03-02 NOTE — Telephone Encounter (Signed)
Spoke with [employer].  Pt has dialysis on Wed so would not be able to be in quarantine as required on 03/09/2019.  Pt also has transportation issues.  Darliss Ridgel is going to call me back tomorrow and let me know if they can work transportation issues out so that she could do a rapid test on 03/11/2019.

## 2019-03-03 ENCOUNTER — Telehealth: Payer: Self-pay | Admitting: Gastroenterology

## 2019-03-03 NOTE — Telephone Encounter (Signed)
Mrs Cherlyn Cushing was calling for [facility] regarding patient. She said she was stepping out and would be back at 1030am and AS could try calling her then. [phone]

## 2019-03-03 NOTE — Telephone Encounter (Signed)
I had called pt yesterday to set up COVID 19 screening.  Pt's relative, Baird Kay requested to call me back.  [name] is cancelling pt's procedure due to transportation and dialysis issues.  She said due to COVID 19, they would really like to wait awhile and call us back when they are ready to re-schedule the procedure.  Endo notified.

## 2019-03-04 DIAGNOSIS — N2581 Secondary hyperparathyroidism of renal origin: Secondary | ICD-10-CM | POA: Diagnosis not present

## 2019-03-04 DIAGNOSIS — D631 Anemia in chronic kidney disease: Secondary | ICD-10-CM | POA: Diagnosis not present

## 2019-03-04 DIAGNOSIS — D509 Iron deficiency anemia, unspecified: Secondary | ICD-10-CM | POA: Diagnosis not present

## 2019-03-04 DIAGNOSIS — N186 End stage renal disease: Secondary | ICD-10-CM | POA: Diagnosis not present

## 2019-03-06 DIAGNOSIS — D631 Anemia in chronic kidney disease: Secondary | ICD-10-CM | POA: Diagnosis not present

## 2019-03-06 DIAGNOSIS — D509 Iron deficiency anemia, unspecified: Secondary | ICD-10-CM | POA: Diagnosis not present

## 2019-03-06 DIAGNOSIS — N186 End stage renal disease: Secondary | ICD-10-CM | POA: Diagnosis not present

## 2019-03-06 DIAGNOSIS — N2581 Secondary hyperparathyroidism of renal origin: Secondary | ICD-10-CM | POA: Diagnosis not present

## 2019-03-09 DIAGNOSIS — D509 Iron deficiency anemia, unspecified: Secondary | ICD-10-CM | POA: Diagnosis not present

## 2019-03-09 DIAGNOSIS — N2581 Secondary hyperparathyroidism of renal origin: Secondary | ICD-10-CM | POA: Diagnosis not present

## 2019-03-09 DIAGNOSIS — D631 Anemia in chronic kidney disease: Secondary | ICD-10-CM | POA: Diagnosis not present

## 2019-03-09 DIAGNOSIS — N186 End stage renal disease: Secondary | ICD-10-CM | POA: Diagnosis not present

## 2019-03-10 DIAGNOSIS — F411 Generalized anxiety disorder: Secondary | ICD-10-CM | POA: Diagnosis not present

## 2019-03-10 DIAGNOSIS — F250 Schizoaffective disorder, bipolar type: Secondary | ICD-10-CM | POA: Diagnosis not present

## 2019-03-11 DIAGNOSIS — N186 End stage renal disease: Secondary | ICD-10-CM | POA: Diagnosis not present

## 2019-03-11 DIAGNOSIS — D631 Anemia in chronic kidney disease: Secondary | ICD-10-CM | POA: Diagnosis not present

## 2019-03-11 DIAGNOSIS — D509 Iron deficiency anemia, unspecified: Secondary | ICD-10-CM | POA: Diagnosis not present

## 2019-03-11 DIAGNOSIS — N2581 Secondary hyperparathyroidism of renal origin: Secondary | ICD-10-CM | POA: Diagnosis not present

## 2019-03-12 ENCOUNTER — Ambulatory Visit (HOSPITAL_COMMUNITY): Admit: 2019-03-12 | Payer: Medicare Other | Admitting: Gastroenterology

## 2019-03-12 ENCOUNTER — Encounter (HOSPITAL_COMMUNITY): Payer: Self-pay

## 2019-03-12 SURGERY — COLONOSCOPY
Anesthesia: Moderate Sedation

## 2019-03-13 DIAGNOSIS — D631 Anemia in chronic kidney disease: Secondary | ICD-10-CM | POA: Diagnosis not present

## 2019-03-13 DIAGNOSIS — N2581 Secondary hyperparathyroidism of renal origin: Secondary | ICD-10-CM | POA: Diagnosis not present

## 2019-03-13 DIAGNOSIS — D509 Iron deficiency anemia, unspecified: Secondary | ICD-10-CM | POA: Diagnosis not present

## 2019-03-13 DIAGNOSIS — N186 End stage renal disease: Secondary | ICD-10-CM | POA: Diagnosis not present

## 2019-03-16 DIAGNOSIS — N186 End stage renal disease: Secondary | ICD-10-CM | POA: Diagnosis not present

## 2019-03-16 DIAGNOSIS — N2581 Secondary hyperparathyroidism of renal origin: Secondary | ICD-10-CM | POA: Diagnosis not present

## 2019-03-16 DIAGNOSIS — D631 Anemia in chronic kidney disease: Secondary | ICD-10-CM | POA: Diagnosis not present

## 2019-03-16 DIAGNOSIS — D509 Iron deficiency anemia, unspecified: Secondary | ICD-10-CM | POA: Diagnosis not present

## 2019-03-18 DIAGNOSIS — D631 Anemia in chronic kidney disease: Secondary | ICD-10-CM | POA: Diagnosis not present

## 2019-03-18 DIAGNOSIS — D509 Iron deficiency anemia, unspecified: Secondary | ICD-10-CM | POA: Diagnosis not present

## 2019-03-18 DIAGNOSIS — N2581 Secondary hyperparathyroidism of renal origin: Secondary | ICD-10-CM | POA: Diagnosis not present

## 2019-03-18 DIAGNOSIS — N186 End stage renal disease: Secondary | ICD-10-CM | POA: Diagnosis not present

## 2019-03-20 DIAGNOSIS — N186 End stage renal disease: Secondary | ICD-10-CM | POA: Diagnosis not present

## 2019-03-20 DIAGNOSIS — D509 Iron deficiency anemia, unspecified: Secondary | ICD-10-CM | POA: Diagnosis not present

## 2019-03-20 DIAGNOSIS — D631 Anemia in chronic kidney disease: Secondary | ICD-10-CM | POA: Diagnosis not present

## 2019-03-20 DIAGNOSIS — N2581 Secondary hyperparathyroidism of renal origin: Secondary | ICD-10-CM | POA: Diagnosis not present

## 2019-03-23 DIAGNOSIS — N2581 Secondary hyperparathyroidism of renal origin: Secondary | ICD-10-CM | POA: Diagnosis not present

## 2019-03-23 DIAGNOSIS — D509 Iron deficiency anemia, unspecified: Secondary | ICD-10-CM | POA: Diagnosis not present

## 2019-03-23 DIAGNOSIS — N186 End stage renal disease: Secondary | ICD-10-CM | POA: Diagnosis not present

## 2019-03-23 DIAGNOSIS — D631 Anemia in chronic kidney disease: Secondary | ICD-10-CM | POA: Diagnosis not present

## 2019-03-24 DIAGNOSIS — Z992 Dependence on renal dialysis: Secondary | ICD-10-CM | POA: Diagnosis not present

## 2019-03-24 DIAGNOSIS — N186 End stage renal disease: Secondary | ICD-10-CM | POA: Diagnosis not present

## 2019-03-25 DIAGNOSIS — N186 End stage renal disease: Secondary | ICD-10-CM | POA: Diagnosis not present

## 2019-03-25 DIAGNOSIS — D509 Iron deficiency anemia, unspecified: Secondary | ICD-10-CM | POA: Diagnosis not present

## 2019-03-25 DIAGNOSIS — N2581 Secondary hyperparathyroidism of renal origin: Secondary | ICD-10-CM | POA: Diagnosis not present

## 2019-03-25 DIAGNOSIS — D631 Anemia in chronic kidney disease: Secondary | ICD-10-CM | POA: Diagnosis not present

## 2019-03-27 DIAGNOSIS — N186 End stage renal disease: Secondary | ICD-10-CM | POA: Diagnosis not present

## 2019-03-27 DIAGNOSIS — N2581 Secondary hyperparathyroidism of renal origin: Secondary | ICD-10-CM | POA: Diagnosis not present

## 2019-03-27 DIAGNOSIS — D509 Iron deficiency anemia, unspecified: Secondary | ICD-10-CM | POA: Diagnosis not present

## 2019-03-27 DIAGNOSIS — D631 Anemia in chronic kidney disease: Secondary | ICD-10-CM | POA: Diagnosis not present

## 2019-03-30 DIAGNOSIS — N186 End stage renal disease: Secondary | ICD-10-CM | POA: Diagnosis not present

## 2019-03-30 DIAGNOSIS — N2581 Secondary hyperparathyroidism of renal origin: Secondary | ICD-10-CM | POA: Diagnosis not present

## 2019-03-30 DIAGNOSIS — D631 Anemia in chronic kidney disease: Secondary | ICD-10-CM | POA: Diagnosis not present

## 2019-03-30 DIAGNOSIS — D509 Iron deficiency anemia, unspecified: Secondary | ICD-10-CM | POA: Diagnosis not present

## 2019-04-01 DIAGNOSIS — D631 Anemia in chronic kidney disease: Secondary | ICD-10-CM | POA: Diagnosis not present

## 2019-04-01 DIAGNOSIS — N186 End stage renal disease: Secondary | ICD-10-CM | POA: Diagnosis not present

## 2019-04-01 DIAGNOSIS — N2581 Secondary hyperparathyroidism of renal origin: Secondary | ICD-10-CM | POA: Diagnosis not present

## 2019-04-01 DIAGNOSIS — D509 Iron deficiency anemia, unspecified: Secondary | ICD-10-CM | POA: Diagnosis not present

## 2019-04-03 DIAGNOSIS — D509 Iron deficiency anemia, unspecified: Secondary | ICD-10-CM | POA: Diagnosis not present

## 2019-04-03 DIAGNOSIS — N2581 Secondary hyperparathyroidism of renal origin: Secondary | ICD-10-CM | POA: Diagnosis not present

## 2019-04-03 DIAGNOSIS — D631 Anemia in chronic kidney disease: Secondary | ICD-10-CM | POA: Diagnosis not present

## 2019-04-03 DIAGNOSIS — N186 End stage renal disease: Secondary | ICD-10-CM | POA: Diagnosis not present

## 2019-04-06 DIAGNOSIS — D631 Anemia in chronic kidney disease: Secondary | ICD-10-CM | POA: Diagnosis not present

## 2019-04-06 DIAGNOSIS — N2581 Secondary hyperparathyroidism of renal origin: Secondary | ICD-10-CM | POA: Diagnosis not present

## 2019-04-06 DIAGNOSIS — N186 End stage renal disease: Secondary | ICD-10-CM | POA: Diagnosis not present

## 2019-04-06 DIAGNOSIS — D509 Iron deficiency anemia, unspecified: Secondary | ICD-10-CM | POA: Diagnosis not present

## 2019-04-08 DIAGNOSIS — N251 Nephrogenic diabetes insipidus: Secondary | ICD-10-CM | POA: Diagnosis not present

## 2019-04-08 DIAGNOSIS — D631 Anemia in chronic kidney disease: Secondary | ICD-10-CM | POA: Diagnosis not present

## 2019-04-08 DIAGNOSIS — D509 Iron deficiency anemia, unspecified: Secondary | ICD-10-CM | POA: Diagnosis not present

## 2019-04-08 DIAGNOSIS — N2581 Secondary hyperparathyroidism of renal origin: Secondary | ICD-10-CM | POA: Diagnosis not present

## 2019-04-08 DIAGNOSIS — N186 End stage renal disease: Secondary | ICD-10-CM | POA: Diagnosis not present

## 2019-04-10 DIAGNOSIS — N186 End stage renal disease: Secondary | ICD-10-CM | POA: Diagnosis not present

## 2019-04-10 DIAGNOSIS — D509 Iron deficiency anemia, unspecified: Secondary | ICD-10-CM | POA: Diagnosis not present

## 2019-04-10 DIAGNOSIS — D631 Anemia in chronic kidney disease: Secondary | ICD-10-CM | POA: Diagnosis not present

## 2019-04-10 DIAGNOSIS — N2581 Secondary hyperparathyroidism of renal origin: Secondary | ICD-10-CM | POA: Diagnosis not present

## 2019-04-13 DIAGNOSIS — D509 Iron deficiency anemia, unspecified: Secondary | ICD-10-CM | POA: Diagnosis not present

## 2019-04-13 DIAGNOSIS — N2581 Secondary hyperparathyroidism of renal origin: Secondary | ICD-10-CM | POA: Diagnosis not present

## 2019-04-13 DIAGNOSIS — N186 End stage renal disease: Secondary | ICD-10-CM | POA: Diagnosis not present

## 2019-04-13 DIAGNOSIS — D631 Anemia in chronic kidney disease: Secondary | ICD-10-CM | POA: Diagnosis not present

## 2019-04-16 DIAGNOSIS — N186 End stage renal disease: Secondary | ICD-10-CM | POA: Diagnosis not present

## 2019-04-16 DIAGNOSIS — D631 Anemia in chronic kidney disease: Secondary | ICD-10-CM | POA: Diagnosis not present

## 2019-04-16 DIAGNOSIS — D509 Iron deficiency anemia, unspecified: Secondary | ICD-10-CM | POA: Diagnosis not present

## 2019-04-16 DIAGNOSIS — N2581 Secondary hyperparathyroidism of renal origin: Secondary | ICD-10-CM | POA: Diagnosis not present

## 2019-04-17 DIAGNOSIS — D509 Iron deficiency anemia, unspecified: Secondary | ICD-10-CM | POA: Diagnosis not present

## 2019-04-17 DIAGNOSIS — N2581 Secondary hyperparathyroidism of renal origin: Secondary | ICD-10-CM | POA: Diagnosis not present

## 2019-04-17 DIAGNOSIS — N186 End stage renal disease: Secondary | ICD-10-CM | POA: Diagnosis not present

## 2019-04-17 DIAGNOSIS — D631 Anemia in chronic kidney disease: Secondary | ICD-10-CM | POA: Diagnosis not present

## 2019-04-20 DIAGNOSIS — D509 Iron deficiency anemia, unspecified: Secondary | ICD-10-CM | POA: Diagnosis not present

## 2019-04-20 DIAGNOSIS — N2581 Secondary hyperparathyroidism of renal origin: Secondary | ICD-10-CM | POA: Diagnosis not present

## 2019-04-20 DIAGNOSIS — N186 End stage renal disease: Secondary | ICD-10-CM | POA: Diagnosis not present

## 2019-04-20 DIAGNOSIS — D631 Anemia in chronic kidney disease: Secondary | ICD-10-CM | POA: Diagnosis not present

## 2019-04-22 DIAGNOSIS — N2581 Secondary hyperparathyroidism of renal origin: Secondary | ICD-10-CM | POA: Diagnosis not present

## 2019-04-22 DIAGNOSIS — N186 End stage renal disease: Secondary | ICD-10-CM | POA: Diagnosis not present

## 2019-04-22 DIAGNOSIS — D509 Iron deficiency anemia, unspecified: Secondary | ICD-10-CM | POA: Diagnosis not present

## 2019-04-22 DIAGNOSIS — D631 Anemia in chronic kidney disease: Secondary | ICD-10-CM | POA: Diagnosis not present

## 2019-04-24 DIAGNOSIS — Z992 Dependence on renal dialysis: Secondary | ICD-10-CM | POA: Diagnosis not present

## 2019-04-24 DIAGNOSIS — N2581 Secondary hyperparathyroidism of renal origin: Secondary | ICD-10-CM | POA: Diagnosis not present

## 2019-04-24 DIAGNOSIS — D509 Iron deficiency anemia, unspecified: Secondary | ICD-10-CM | POA: Diagnosis not present

## 2019-04-24 DIAGNOSIS — D631 Anemia in chronic kidney disease: Secondary | ICD-10-CM | POA: Diagnosis not present

## 2019-04-24 DIAGNOSIS — N186 End stage renal disease: Secondary | ICD-10-CM | POA: Diagnosis not present

## 2019-04-27 DIAGNOSIS — D631 Anemia in chronic kidney disease: Secondary | ICD-10-CM | POA: Diagnosis not present

## 2019-04-27 DIAGNOSIS — N186 End stage renal disease: Secondary | ICD-10-CM | POA: Diagnosis not present

## 2019-04-27 DIAGNOSIS — N2581 Secondary hyperparathyroidism of renal origin: Secondary | ICD-10-CM | POA: Diagnosis not present

## 2019-04-27 DIAGNOSIS — E875 Hyperkalemia: Secondary | ICD-10-CM | POA: Diagnosis not present

## 2019-04-27 DIAGNOSIS — D509 Iron deficiency anemia, unspecified: Secondary | ICD-10-CM | POA: Diagnosis not present

## 2019-04-27 DIAGNOSIS — Z992 Dependence on renal dialysis: Secondary | ICD-10-CM | POA: Diagnosis not present

## 2019-04-29 DIAGNOSIS — N186 End stage renal disease: Secondary | ICD-10-CM | POA: Diagnosis not present

## 2019-04-29 DIAGNOSIS — N2581 Secondary hyperparathyroidism of renal origin: Secondary | ICD-10-CM | POA: Diagnosis not present

## 2019-04-29 DIAGNOSIS — D509 Iron deficiency anemia, unspecified: Secondary | ICD-10-CM | POA: Diagnosis not present

## 2019-04-29 DIAGNOSIS — Z992 Dependence on renal dialysis: Secondary | ICD-10-CM | POA: Diagnosis not present

## 2019-04-29 DIAGNOSIS — E875 Hyperkalemia: Secondary | ICD-10-CM | POA: Diagnosis not present

## 2019-05-01 DIAGNOSIS — Z992 Dependence on renal dialysis: Secondary | ICD-10-CM | POA: Diagnosis not present

## 2019-05-01 DIAGNOSIS — N2581 Secondary hyperparathyroidism of renal origin: Secondary | ICD-10-CM | POA: Diagnosis not present

## 2019-05-01 DIAGNOSIS — D509 Iron deficiency anemia, unspecified: Secondary | ICD-10-CM | POA: Diagnosis not present

## 2019-05-01 DIAGNOSIS — E875 Hyperkalemia: Secondary | ICD-10-CM | POA: Diagnosis not present

## 2019-05-01 DIAGNOSIS — N186 End stage renal disease: Secondary | ICD-10-CM | POA: Diagnosis not present

## 2019-05-04 DIAGNOSIS — N186 End stage renal disease: Secondary | ICD-10-CM | POA: Diagnosis not present

## 2019-05-04 DIAGNOSIS — D509 Iron deficiency anemia, unspecified: Secondary | ICD-10-CM | POA: Diagnosis not present

## 2019-05-04 DIAGNOSIS — E875 Hyperkalemia: Secondary | ICD-10-CM | POA: Diagnosis not present

## 2019-05-04 DIAGNOSIS — N2581 Secondary hyperparathyroidism of renal origin: Secondary | ICD-10-CM | POA: Diagnosis not present

## 2019-05-04 DIAGNOSIS — Z992 Dependence on renal dialysis: Secondary | ICD-10-CM | POA: Diagnosis not present

## 2019-05-05 DIAGNOSIS — F411 Generalized anxiety disorder: Secondary | ICD-10-CM | POA: Diagnosis not present

## 2019-05-05 DIAGNOSIS — F250 Schizoaffective disorder, bipolar type: Secondary | ICD-10-CM | POA: Diagnosis not present

## 2019-05-06 DIAGNOSIS — D509 Iron deficiency anemia, unspecified: Secondary | ICD-10-CM | POA: Diagnosis not present

## 2019-05-06 DIAGNOSIS — Z992 Dependence on renal dialysis: Secondary | ICD-10-CM | POA: Diagnosis not present

## 2019-05-06 DIAGNOSIS — N2581 Secondary hyperparathyroidism of renal origin: Secondary | ICD-10-CM | POA: Diagnosis not present

## 2019-05-06 DIAGNOSIS — E875 Hyperkalemia: Secondary | ICD-10-CM | POA: Diagnosis not present

## 2019-05-06 DIAGNOSIS — N186 End stage renal disease: Secondary | ICD-10-CM | POA: Diagnosis not present

## 2019-05-08 DIAGNOSIS — E875 Hyperkalemia: Secondary | ICD-10-CM | POA: Diagnosis not present

## 2019-05-08 DIAGNOSIS — N186 End stage renal disease: Secondary | ICD-10-CM | POA: Diagnosis not present

## 2019-05-08 DIAGNOSIS — D509 Iron deficiency anemia, unspecified: Secondary | ICD-10-CM | POA: Diagnosis not present

## 2019-05-08 DIAGNOSIS — N2581 Secondary hyperparathyroidism of renal origin: Secondary | ICD-10-CM | POA: Diagnosis not present

## 2019-05-08 DIAGNOSIS — Z992 Dependence on renal dialysis: Secondary | ICD-10-CM | POA: Diagnosis not present

## 2019-05-11 DIAGNOSIS — Z992 Dependence on renal dialysis: Secondary | ICD-10-CM | POA: Diagnosis not present

## 2019-05-11 DIAGNOSIS — N186 End stage renal disease: Secondary | ICD-10-CM | POA: Diagnosis not present

## 2019-05-11 DIAGNOSIS — N2581 Secondary hyperparathyroidism of renal origin: Secondary | ICD-10-CM | POA: Diagnosis not present

## 2019-05-11 DIAGNOSIS — E875 Hyperkalemia: Secondary | ICD-10-CM | POA: Diagnosis not present

## 2019-05-11 DIAGNOSIS — D509 Iron deficiency anemia, unspecified: Secondary | ICD-10-CM | POA: Diagnosis not present

## 2019-05-13 DIAGNOSIS — N186 End stage renal disease: Secondary | ICD-10-CM | POA: Diagnosis not present

## 2019-05-13 DIAGNOSIS — Z992 Dependence on renal dialysis: Secondary | ICD-10-CM | POA: Diagnosis not present

## 2019-05-13 DIAGNOSIS — D509 Iron deficiency anemia, unspecified: Secondary | ICD-10-CM | POA: Diagnosis not present

## 2019-05-13 DIAGNOSIS — N2581 Secondary hyperparathyroidism of renal origin: Secondary | ICD-10-CM | POA: Diagnosis not present

## 2019-05-13 DIAGNOSIS — E875 Hyperkalemia: Secondary | ICD-10-CM | POA: Diagnosis not present

## 2019-05-15 DIAGNOSIS — Z992 Dependence on renal dialysis: Secondary | ICD-10-CM | POA: Diagnosis not present

## 2019-05-15 DIAGNOSIS — D509 Iron deficiency anemia, unspecified: Secondary | ICD-10-CM | POA: Diagnosis not present

## 2019-05-15 DIAGNOSIS — N2581 Secondary hyperparathyroidism of renal origin: Secondary | ICD-10-CM | POA: Diagnosis not present

## 2019-05-15 DIAGNOSIS — E875 Hyperkalemia: Secondary | ICD-10-CM | POA: Diagnosis not present

## 2019-05-15 DIAGNOSIS — N186 End stage renal disease: Secondary | ICD-10-CM | POA: Diagnosis not present

## 2019-05-18 DIAGNOSIS — N186 End stage renal disease: Secondary | ICD-10-CM | POA: Diagnosis not present

## 2019-05-18 DIAGNOSIS — N2581 Secondary hyperparathyroidism of renal origin: Secondary | ICD-10-CM | POA: Diagnosis not present

## 2019-05-18 DIAGNOSIS — E875 Hyperkalemia: Secondary | ICD-10-CM | POA: Diagnosis not present

## 2019-05-18 DIAGNOSIS — D509 Iron deficiency anemia, unspecified: Secondary | ICD-10-CM | POA: Diagnosis not present

## 2019-05-18 DIAGNOSIS — Z992 Dependence on renal dialysis: Secondary | ICD-10-CM | POA: Diagnosis not present

## 2019-05-20 DIAGNOSIS — D509 Iron deficiency anemia, unspecified: Secondary | ICD-10-CM | POA: Diagnosis not present

## 2019-05-20 DIAGNOSIS — N2581 Secondary hyperparathyroidism of renal origin: Secondary | ICD-10-CM | POA: Diagnosis not present

## 2019-05-20 DIAGNOSIS — Z992 Dependence on renal dialysis: Secondary | ICD-10-CM | POA: Diagnosis not present

## 2019-05-20 DIAGNOSIS — N186 End stage renal disease: Secondary | ICD-10-CM | POA: Diagnosis not present

## 2019-05-20 DIAGNOSIS — E875 Hyperkalemia: Secondary | ICD-10-CM | POA: Diagnosis not present

## 2019-05-21 ENCOUNTER — Ambulatory Visit: Payer: Medicare Other | Admitting: Nurse Practitioner

## 2019-05-22 DIAGNOSIS — N2581 Secondary hyperparathyroidism of renal origin: Secondary | ICD-10-CM | POA: Diagnosis not present

## 2019-05-22 DIAGNOSIS — E875 Hyperkalemia: Secondary | ICD-10-CM | POA: Diagnosis not present

## 2019-05-22 DIAGNOSIS — N186 End stage renal disease: Secondary | ICD-10-CM | POA: Diagnosis not present

## 2019-05-22 DIAGNOSIS — D509 Iron deficiency anemia, unspecified: Secondary | ICD-10-CM | POA: Diagnosis not present

## 2019-05-22 DIAGNOSIS — Z992 Dependence on renal dialysis: Secondary | ICD-10-CM | POA: Diagnosis not present

## 2019-05-25 DIAGNOSIS — D509 Iron deficiency anemia, unspecified: Secondary | ICD-10-CM | POA: Diagnosis not present

## 2019-05-25 DIAGNOSIS — Z992 Dependence on renal dialysis: Secondary | ICD-10-CM | POA: Diagnosis not present

## 2019-05-25 DIAGNOSIS — N2581 Secondary hyperparathyroidism of renal origin: Secondary | ICD-10-CM | POA: Diagnosis not present

## 2019-05-25 DIAGNOSIS — E875 Hyperkalemia: Secondary | ICD-10-CM | POA: Diagnosis not present

## 2019-05-25 DIAGNOSIS — N186 End stage renal disease: Secondary | ICD-10-CM | POA: Diagnosis not present

## 2019-05-26 ENCOUNTER — Other Ambulatory Visit: Payer: Self-pay | Admitting: Nurse Practitioner

## 2019-05-27 DIAGNOSIS — N2581 Secondary hyperparathyroidism of renal origin: Secondary | ICD-10-CM | POA: Diagnosis not present

## 2019-05-27 DIAGNOSIS — D509 Iron deficiency anemia, unspecified: Secondary | ICD-10-CM | POA: Diagnosis not present

## 2019-05-27 DIAGNOSIS — D631 Anemia in chronic kidney disease: Secondary | ICD-10-CM | POA: Diagnosis not present

## 2019-05-27 DIAGNOSIS — Z992 Dependence on renal dialysis: Secondary | ICD-10-CM | POA: Diagnosis not present

## 2019-05-27 DIAGNOSIS — Z23 Encounter for immunization: Secondary | ICD-10-CM | POA: Diagnosis not present

## 2019-05-27 DIAGNOSIS — N186 End stage renal disease: Secondary | ICD-10-CM | POA: Diagnosis not present

## 2019-05-27 DIAGNOSIS — Z4931 Encounter for adequacy testing for hemodialysis: Secondary | ICD-10-CM | POA: Diagnosis not present

## 2019-05-27 DIAGNOSIS — E875 Hyperkalemia: Secondary | ICD-10-CM | POA: Diagnosis not present

## 2019-05-27 DIAGNOSIS — N251 Nephrogenic diabetes insipidus: Secondary | ICD-10-CM | POA: Diagnosis not present

## 2019-05-29 DIAGNOSIS — Z4931 Encounter for adequacy testing for hemodialysis: Secondary | ICD-10-CM | POA: Diagnosis not present

## 2019-05-29 DIAGNOSIS — Z992 Dependence on renal dialysis: Secondary | ICD-10-CM | POA: Diagnosis not present

## 2019-05-29 DIAGNOSIS — N186 End stage renal disease: Secondary | ICD-10-CM | POA: Diagnosis not present

## 2019-05-29 DIAGNOSIS — E875 Hyperkalemia: Secondary | ICD-10-CM | POA: Diagnosis not present

## 2019-05-29 DIAGNOSIS — D509 Iron deficiency anemia, unspecified: Secondary | ICD-10-CM | POA: Diagnosis not present

## 2019-05-29 DIAGNOSIS — N2581 Secondary hyperparathyroidism of renal origin: Secondary | ICD-10-CM | POA: Diagnosis not present

## 2019-06-01 DIAGNOSIS — N186 End stage renal disease: Secondary | ICD-10-CM | POA: Diagnosis not present

## 2019-06-01 DIAGNOSIS — Z4931 Encounter for adequacy testing for hemodialysis: Secondary | ICD-10-CM | POA: Diagnosis not present

## 2019-06-01 DIAGNOSIS — N2581 Secondary hyperparathyroidism of renal origin: Secondary | ICD-10-CM | POA: Diagnosis not present

## 2019-06-01 DIAGNOSIS — Z992 Dependence on renal dialysis: Secondary | ICD-10-CM | POA: Diagnosis not present

## 2019-06-01 DIAGNOSIS — D509 Iron deficiency anemia, unspecified: Secondary | ICD-10-CM | POA: Diagnosis not present

## 2019-06-01 DIAGNOSIS — E875 Hyperkalemia: Secondary | ICD-10-CM | POA: Diagnosis not present

## 2019-06-03 DIAGNOSIS — Z4931 Encounter for adequacy testing for hemodialysis: Secondary | ICD-10-CM | POA: Diagnosis not present

## 2019-06-03 DIAGNOSIS — D509 Iron deficiency anemia, unspecified: Secondary | ICD-10-CM | POA: Diagnosis not present

## 2019-06-03 DIAGNOSIS — E875 Hyperkalemia: Secondary | ICD-10-CM | POA: Diagnosis not present

## 2019-06-03 DIAGNOSIS — N2581 Secondary hyperparathyroidism of renal origin: Secondary | ICD-10-CM | POA: Diagnosis not present

## 2019-06-03 DIAGNOSIS — Z992 Dependence on renal dialysis: Secondary | ICD-10-CM | POA: Diagnosis not present

## 2019-06-03 DIAGNOSIS — N186 End stage renal disease: Secondary | ICD-10-CM | POA: Diagnosis not present

## 2019-06-05 DIAGNOSIS — N2581 Secondary hyperparathyroidism of renal origin: Secondary | ICD-10-CM | POA: Diagnosis not present

## 2019-06-05 DIAGNOSIS — Z992 Dependence on renal dialysis: Secondary | ICD-10-CM | POA: Diagnosis not present

## 2019-06-05 DIAGNOSIS — E875 Hyperkalemia: Secondary | ICD-10-CM | POA: Diagnosis not present

## 2019-06-05 DIAGNOSIS — Z4931 Encounter for adequacy testing for hemodialysis: Secondary | ICD-10-CM | POA: Diagnosis not present

## 2019-06-05 DIAGNOSIS — D509 Iron deficiency anemia, unspecified: Secondary | ICD-10-CM | POA: Diagnosis not present

## 2019-06-05 DIAGNOSIS — N186 End stage renal disease: Secondary | ICD-10-CM | POA: Diagnosis not present

## 2019-06-08 DIAGNOSIS — Z4931 Encounter for adequacy testing for hemodialysis: Secondary | ICD-10-CM | POA: Diagnosis not present

## 2019-06-08 DIAGNOSIS — D509 Iron deficiency anemia, unspecified: Secondary | ICD-10-CM | POA: Diagnosis not present

## 2019-06-08 DIAGNOSIS — N2581 Secondary hyperparathyroidism of renal origin: Secondary | ICD-10-CM | POA: Diagnosis not present

## 2019-06-08 DIAGNOSIS — E875 Hyperkalemia: Secondary | ICD-10-CM | POA: Diagnosis not present

## 2019-06-08 DIAGNOSIS — N186 End stage renal disease: Secondary | ICD-10-CM | POA: Diagnosis not present

## 2019-06-08 DIAGNOSIS — Z992 Dependence on renal dialysis: Secondary | ICD-10-CM | POA: Diagnosis not present

## 2019-06-10 DIAGNOSIS — Z4931 Encounter for adequacy testing for hemodialysis: Secondary | ICD-10-CM | POA: Diagnosis not present

## 2019-06-10 DIAGNOSIS — Z992 Dependence on renal dialysis: Secondary | ICD-10-CM | POA: Diagnosis not present

## 2019-06-10 DIAGNOSIS — D509 Iron deficiency anemia, unspecified: Secondary | ICD-10-CM | POA: Diagnosis not present

## 2019-06-10 DIAGNOSIS — N2581 Secondary hyperparathyroidism of renal origin: Secondary | ICD-10-CM | POA: Diagnosis not present

## 2019-06-10 DIAGNOSIS — N186 End stage renal disease: Secondary | ICD-10-CM | POA: Diagnosis not present

## 2019-06-10 DIAGNOSIS — E875 Hyperkalemia: Secondary | ICD-10-CM | POA: Diagnosis not present

## 2019-06-12 DIAGNOSIS — Z992 Dependence on renal dialysis: Secondary | ICD-10-CM | POA: Diagnosis not present

## 2019-06-12 DIAGNOSIS — N186 End stage renal disease: Secondary | ICD-10-CM | POA: Diagnosis not present

## 2019-06-12 DIAGNOSIS — N2581 Secondary hyperparathyroidism of renal origin: Secondary | ICD-10-CM | POA: Diagnosis not present

## 2019-06-12 DIAGNOSIS — Z4931 Encounter for adequacy testing for hemodialysis: Secondary | ICD-10-CM | POA: Diagnosis not present

## 2019-06-12 DIAGNOSIS — D509 Iron deficiency anemia, unspecified: Secondary | ICD-10-CM | POA: Diagnosis not present

## 2019-06-12 DIAGNOSIS — E875 Hyperkalemia: Secondary | ICD-10-CM | POA: Diagnosis not present

## 2019-06-15 DIAGNOSIS — D509 Iron deficiency anemia, unspecified: Secondary | ICD-10-CM | POA: Diagnosis not present

## 2019-06-15 DIAGNOSIS — E875 Hyperkalemia: Secondary | ICD-10-CM | POA: Diagnosis not present

## 2019-06-15 DIAGNOSIS — Z4931 Encounter for adequacy testing for hemodialysis: Secondary | ICD-10-CM | POA: Diagnosis not present

## 2019-06-15 DIAGNOSIS — N2581 Secondary hyperparathyroidism of renal origin: Secondary | ICD-10-CM | POA: Diagnosis not present

## 2019-06-15 DIAGNOSIS — Z992 Dependence on renal dialysis: Secondary | ICD-10-CM | POA: Diagnosis not present

## 2019-06-15 DIAGNOSIS — N186 End stage renal disease: Secondary | ICD-10-CM | POA: Diagnosis not present

## 2019-06-17 DIAGNOSIS — D509 Iron deficiency anemia, unspecified: Secondary | ICD-10-CM | POA: Diagnosis not present

## 2019-06-17 DIAGNOSIS — E875 Hyperkalemia: Secondary | ICD-10-CM | POA: Diagnosis not present

## 2019-06-17 DIAGNOSIS — Z992 Dependence on renal dialysis: Secondary | ICD-10-CM | POA: Diagnosis not present

## 2019-06-17 DIAGNOSIS — Z4931 Encounter for adequacy testing for hemodialysis: Secondary | ICD-10-CM | POA: Diagnosis not present

## 2019-06-17 DIAGNOSIS — N2581 Secondary hyperparathyroidism of renal origin: Secondary | ICD-10-CM | POA: Diagnosis not present

## 2019-06-17 DIAGNOSIS — N186 End stage renal disease: Secondary | ICD-10-CM | POA: Diagnosis not present

## 2019-06-19 DIAGNOSIS — N186 End stage renal disease: Secondary | ICD-10-CM | POA: Diagnosis not present

## 2019-06-19 DIAGNOSIS — D509 Iron deficiency anemia, unspecified: Secondary | ICD-10-CM | POA: Diagnosis not present

## 2019-06-19 DIAGNOSIS — N2581 Secondary hyperparathyroidism of renal origin: Secondary | ICD-10-CM | POA: Diagnosis not present

## 2019-06-19 DIAGNOSIS — Z4931 Encounter for adequacy testing for hemodialysis: Secondary | ICD-10-CM | POA: Diagnosis not present

## 2019-06-19 DIAGNOSIS — E875 Hyperkalemia: Secondary | ICD-10-CM | POA: Diagnosis not present

## 2019-06-19 DIAGNOSIS — Z992 Dependence on renal dialysis: Secondary | ICD-10-CM | POA: Diagnosis not present

## 2019-06-22 DIAGNOSIS — Z992 Dependence on renal dialysis: Secondary | ICD-10-CM | POA: Diagnosis not present

## 2019-06-22 DIAGNOSIS — E875 Hyperkalemia: Secondary | ICD-10-CM | POA: Diagnosis not present

## 2019-06-22 DIAGNOSIS — N2581 Secondary hyperparathyroidism of renal origin: Secondary | ICD-10-CM | POA: Diagnosis not present

## 2019-06-22 DIAGNOSIS — N186 End stage renal disease: Secondary | ICD-10-CM | POA: Diagnosis not present

## 2019-06-22 DIAGNOSIS — Z4931 Encounter for adequacy testing for hemodialysis: Secondary | ICD-10-CM | POA: Diagnosis not present

## 2019-06-22 DIAGNOSIS — D509 Iron deficiency anemia, unspecified: Secondary | ICD-10-CM | POA: Diagnosis not present

## 2019-06-24 DIAGNOSIS — E875 Hyperkalemia: Secondary | ICD-10-CM | POA: Diagnosis not present

## 2019-06-24 DIAGNOSIS — Z992 Dependence on renal dialysis: Secondary | ICD-10-CM | POA: Diagnosis not present

## 2019-06-24 DIAGNOSIS — N186 End stage renal disease: Secondary | ICD-10-CM | POA: Diagnosis not present

## 2019-06-24 DIAGNOSIS — Z4931 Encounter for adequacy testing for hemodialysis: Secondary | ICD-10-CM | POA: Diagnosis not present

## 2019-06-24 DIAGNOSIS — D509 Iron deficiency anemia, unspecified: Secondary | ICD-10-CM | POA: Diagnosis not present

## 2019-06-24 DIAGNOSIS — N2581 Secondary hyperparathyroidism of renal origin: Secondary | ICD-10-CM | POA: Diagnosis not present

## 2019-06-26 DIAGNOSIS — Z992 Dependence on renal dialysis: Secondary | ICD-10-CM | POA: Diagnosis not present

## 2019-06-26 DIAGNOSIS — E875 Hyperkalemia: Secondary | ICD-10-CM | POA: Diagnosis not present

## 2019-06-26 DIAGNOSIS — N2581 Secondary hyperparathyroidism of renal origin: Secondary | ICD-10-CM | POA: Diagnosis not present

## 2019-06-26 DIAGNOSIS — D631 Anemia in chronic kidney disease: Secondary | ICD-10-CM | POA: Diagnosis not present

## 2019-06-26 DIAGNOSIS — D509 Iron deficiency anemia, unspecified: Secondary | ICD-10-CM | POA: Diagnosis not present

## 2019-06-26 DIAGNOSIS — N186 End stage renal disease: Secondary | ICD-10-CM | POA: Diagnosis not present

## 2019-06-29 DIAGNOSIS — E875 Hyperkalemia: Secondary | ICD-10-CM | POA: Diagnosis not present

## 2019-06-29 DIAGNOSIS — N186 End stage renal disease: Secondary | ICD-10-CM | POA: Diagnosis not present

## 2019-06-29 DIAGNOSIS — N2581 Secondary hyperparathyroidism of renal origin: Secondary | ICD-10-CM | POA: Diagnosis not present

## 2019-06-29 DIAGNOSIS — Z992 Dependence on renal dialysis: Secondary | ICD-10-CM | POA: Diagnosis not present

## 2019-06-29 DIAGNOSIS — D509 Iron deficiency anemia, unspecified: Secondary | ICD-10-CM | POA: Diagnosis not present

## 2019-07-01 DIAGNOSIS — E875 Hyperkalemia: Secondary | ICD-10-CM | POA: Diagnosis not present

## 2019-07-01 DIAGNOSIS — D509 Iron deficiency anemia, unspecified: Secondary | ICD-10-CM | POA: Diagnosis not present

## 2019-07-01 DIAGNOSIS — Z992 Dependence on renal dialysis: Secondary | ICD-10-CM | POA: Diagnosis not present

## 2019-07-01 DIAGNOSIS — N2581 Secondary hyperparathyroidism of renal origin: Secondary | ICD-10-CM | POA: Diagnosis not present

## 2019-07-01 DIAGNOSIS — N186 End stage renal disease: Secondary | ICD-10-CM | POA: Diagnosis not present

## 2019-07-02 DIAGNOSIS — F411 Generalized anxiety disorder: Secondary | ICD-10-CM | POA: Diagnosis not present

## 2019-07-02 DIAGNOSIS — F250 Schizoaffective disorder, bipolar type: Secondary | ICD-10-CM | POA: Diagnosis not present

## 2019-07-03 DIAGNOSIS — E875 Hyperkalemia: Secondary | ICD-10-CM | POA: Diagnosis not present

## 2019-07-03 DIAGNOSIS — D509 Iron deficiency anemia, unspecified: Secondary | ICD-10-CM | POA: Diagnosis not present

## 2019-07-03 DIAGNOSIS — N2581 Secondary hyperparathyroidism of renal origin: Secondary | ICD-10-CM | POA: Diagnosis not present

## 2019-07-03 DIAGNOSIS — N186 End stage renal disease: Secondary | ICD-10-CM | POA: Diagnosis not present

## 2019-07-03 DIAGNOSIS — Z992 Dependence on renal dialysis: Secondary | ICD-10-CM | POA: Diagnosis not present

## 2019-07-06 DIAGNOSIS — E875 Hyperkalemia: Secondary | ICD-10-CM | POA: Diagnosis not present

## 2019-07-06 DIAGNOSIS — N186 End stage renal disease: Secondary | ICD-10-CM | POA: Diagnosis not present

## 2019-07-06 DIAGNOSIS — D509 Iron deficiency anemia, unspecified: Secondary | ICD-10-CM | POA: Diagnosis not present

## 2019-07-06 DIAGNOSIS — Z992 Dependence on renal dialysis: Secondary | ICD-10-CM | POA: Diagnosis not present

## 2019-07-06 DIAGNOSIS — N2581 Secondary hyperparathyroidism of renal origin: Secondary | ICD-10-CM | POA: Diagnosis not present

## 2019-07-08 DIAGNOSIS — E875 Hyperkalemia: Secondary | ICD-10-CM | POA: Diagnosis not present

## 2019-07-08 DIAGNOSIS — D509 Iron deficiency anemia, unspecified: Secondary | ICD-10-CM | POA: Diagnosis not present

## 2019-07-08 DIAGNOSIS — Z992 Dependence on renal dialysis: Secondary | ICD-10-CM | POA: Diagnosis not present

## 2019-07-08 DIAGNOSIS — N2581 Secondary hyperparathyroidism of renal origin: Secondary | ICD-10-CM | POA: Diagnosis not present

## 2019-07-08 DIAGNOSIS — N186 End stage renal disease: Secondary | ICD-10-CM | POA: Diagnosis not present

## 2019-07-10 DIAGNOSIS — N2581 Secondary hyperparathyroidism of renal origin: Secondary | ICD-10-CM | POA: Diagnosis not present

## 2019-07-10 DIAGNOSIS — D509 Iron deficiency anemia, unspecified: Secondary | ICD-10-CM | POA: Diagnosis not present

## 2019-07-10 DIAGNOSIS — N186 End stage renal disease: Secondary | ICD-10-CM | POA: Diagnosis not present

## 2019-07-10 DIAGNOSIS — Z992 Dependence on renal dialysis: Secondary | ICD-10-CM | POA: Diagnosis not present

## 2019-07-10 DIAGNOSIS — E875 Hyperkalemia: Secondary | ICD-10-CM | POA: Diagnosis not present

## 2019-07-13 DIAGNOSIS — D509 Iron deficiency anemia, unspecified: Secondary | ICD-10-CM | POA: Diagnosis not present

## 2019-07-13 DIAGNOSIS — Z992 Dependence on renal dialysis: Secondary | ICD-10-CM | POA: Diagnosis not present

## 2019-07-13 DIAGNOSIS — E875 Hyperkalemia: Secondary | ICD-10-CM | POA: Diagnosis not present

## 2019-07-13 DIAGNOSIS — N186 End stage renal disease: Secondary | ICD-10-CM | POA: Diagnosis not present

## 2019-07-13 DIAGNOSIS — N2581 Secondary hyperparathyroidism of renal origin: Secondary | ICD-10-CM | POA: Diagnosis not present

## 2019-07-15 DIAGNOSIS — N186 End stage renal disease: Secondary | ICD-10-CM | POA: Diagnosis not present

## 2019-07-15 DIAGNOSIS — E875 Hyperkalemia: Secondary | ICD-10-CM | POA: Diagnosis not present

## 2019-07-15 DIAGNOSIS — D509 Iron deficiency anemia, unspecified: Secondary | ICD-10-CM | POA: Diagnosis not present

## 2019-07-15 DIAGNOSIS — N2581 Secondary hyperparathyroidism of renal origin: Secondary | ICD-10-CM | POA: Diagnosis not present

## 2019-07-15 DIAGNOSIS — Z992 Dependence on renal dialysis: Secondary | ICD-10-CM | POA: Diagnosis not present

## 2019-07-17 DIAGNOSIS — D509 Iron deficiency anemia, unspecified: Secondary | ICD-10-CM | POA: Diagnosis not present

## 2019-07-17 DIAGNOSIS — Z992 Dependence on renal dialysis: Secondary | ICD-10-CM | POA: Diagnosis not present

## 2019-07-17 DIAGNOSIS — N186 End stage renal disease: Secondary | ICD-10-CM | POA: Diagnosis not present

## 2019-07-17 DIAGNOSIS — N2581 Secondary hyperparathyroidism of renal origin: Secondary | ICD-10-CM | POA: Diagnosis not present

## 2019-07-17 DIAGNOSIS — E875 Hyperkalemia: Secondary | ICD-10-CM | POA: Diagnosis not present

## 2019-07-20 DIAGNOSIS — N2581 Secondary hyperparathyroidism of renal origin: Secondary | ICD-10-CM | POA: Diagnosis not present

## 2019-07-20 DIAGNOSIS — D509 Iron deficiency anemia, unspecified: Secondary | ICD-10-CM | POA: Diagnosis not present

## 2019-07-20 DIAGNOSIS — Z992 Dependence on renal dialysis: Secondary | ICD-10-CM | POA: Diagnosis not present

## 2019-07-20 DIAGNOSIS — E875 Hyperkalemia: Secondary | ICD-10-CM | POA: Diagnosis not present

## 2019-07-20 DIAGNOSIS — N186 End stage renal disease: Secondary | ICD-10-CM | POA: Diagnosis not present

## 2019-07-22 DIAGNOSIS — D509 Iron deficiency anemia, unspecified: Secondary | ICD-10-CM | POA: Diagnosis not present

## 2019-07-22 DIAGNOSIS — E875 Hyperkalemia: Secondary | ICD-10-CM | POA: Diagnosis not present

## 2019-07-22 DIAGNOSIS — N2581 Secondary hyperparathyroidism of renal origin: Secondary | ICD-10-CM | POA: Diagnosis not present

## 2019-07-22 DIAGNOSIS — Z992 Dependence on renal dialysis: Secondary | ICD-10-CM | POA: Diagnosis not present

## 2019-07-22 DIAGNOSIS — N186 End stage renal disease: Secondary | ICD-10-CM | POA: Diagnosis not present

## 2019-07-24 DIAGNOSIS — E875 Hyperkalemia: Secondary | ICD-10-CM | POA: Diagnosis not present

## 2019-07-24 DIAGNOSIS — D509 Iron deficiency anemia, unspecified: Secondary | ICD-10-CM | POA: Diagnosis not present

## 2019-07-24 DIAGNOSIS — Z992 Dependence on renal dialysis: Secondary | ICD-10-CM | POA: Diagnosis not present

## 2019-07-24 DIAGNOSIS — N186 End stage renal disease: Secondary | ICD-10-CM | POA: Diagnosis not present

## 2019-07-24 DIAGNOSIS — N2581 Secondary hyperparathyroidism of renal origin: Secondary | ICD-10-CM | POA: Diagnosis not present

## 2019-07-25 DIAGNOSIS — Z992 Dependence on renal dialysis: Secondary | ICD-10-CM | POA: Diagnosis not present

## 2019-07-25 DIAGNOSIS — N186 End stage renal disease: Secondary | ICD-10-CM | POA: Diagnosis not present

## 2019-07-27 DIAGNOSIS — N186 End stage renal disease: Secondary | ICD-10-CM | POA: Diagnosis not present

## 2019-07-27 DIAGNOSIS — D509 Iron deficiency anemia, unspecified: Secondary | ICD-10-CM | POA: Diagnosis not present

## 2019-07-27 DIAGNOSIS — Z992 Dependence on renal dialysis: Secondary | ICD-10-CM | POA: Diagnosis not present

## 2019-07-27 DIAGNOSIS — N2581 Secondary hyperparathyroidism of renal origin: Secondary | ICD-10-CM | POA: Diagnosis not present

## 2019-07-27 DIAGNOSIS — D631 Anemia in chronic kidney disease: Secondary | ICD-10-CM | POA: Diagnosis not present

## 2019-07-29 DIAGNOSIS — D509 Iron deficiency anemia, unspecified: Secondary | ICD-10-CM | POA: Diagnosis not present

## 2019-07-29 DIAGNOSIS — N2581 Secondary hyperparathyroidism of renal origin: Secondary | ICD-10-CM | POA: Diagnosis not present

## 2019-07-29 DIAGNOSIS — N186 End stage renal disease: Secondary | ICD-10-CM | POA: Diagnosis not present

## 2019-07-29 DIAGNOSIS — Z992 Dependence on renal dialysis: Secondary | ICD-10-CM | POA: Diagnosis not present

## 2019-07-31 DIAGNOSIS — N2581 Secondary hyperparathyroidism of renal origin: Secondary | ICD-10-CM | POA: Diagnosis not present

## 2019-07-31 DIAGNOSIS — D509 Iron deficiency anemia, unspecified: Secondary | ICD-10-CM | POA: Diagnosis not present

## 2019-07-31 DIAGNOSIS — Z992 Dependence on renal dialysis: Secondary | ICD-10-CM | POA: Diagnosis not present

## 2019-07-31 DIAGNOSIS — N186 End stage renal disease: Secondary | ICD-10-CM | POA: Diagnosis not present

## 2019-08-03 DIAGNOSIS — D509 Iron deficiency anemia, unspecified: Secondary | ICD-10-CM | POA: Diagnosis not present

## 2019-08-03 DIAGNOSIS — N186 End stage renal disease: Secondary | ICD-10-CM | POA: Diagnosis not present

## 2019-08-03 DIAGNOSIS — N2581 Secondary hyperparathyroidism of renal origin: Secondary | ICD-10-CM | POA: Diagnosis not present

## 2019-08-03 DIAGNOSIS — Z992 Dependence on renal dialysis: Secondary | ICD-10-CM | POA: Diagnosis not present

## 2019-08-05 DIAGNOSIS — N2581 Secondary hyperparathyroidism of renal origin: Secondary | ICD-10-CM | POA: Diagnosis not present

## 2019-08-05 DIAGNOSIS — D509 Iron deficiency anemia, unspecified: Secondary | ICD-10-CM | POA: Diagnosis not present

## 2019-08-05 DIAGNOSIS — N186 End stage renal disease: Secondary | ICD-10-CM | POA: Diagnosis not present

## 2019-08-05 DIAGNOSIS — Z992 Dependence on renal dialysis: Secondary | ICD-10-CM | POA: Diagnosis not present

## 2019-08-07 DIAGNOSIS — N2581 Secondary hyperparathyroidism of renal origin: Secondary | ICD-10-CM | POA: Diagnosis not present

## 2019-08-07 DIAGNOSIS — Z992 Dependence on renal dialysis: Secondary | ICD-10-CM | POA: Diagnosis not present

## 2019-08-07 DIAGNOSIS — N186 End stage renal disease: Secondary | ICD-10-CM | POA: Diagnosis not present

## 2019-08-07 DIAGNOSIS — D509 Iron deficiency anemia, unspecified: Secondary | ICD-10-CM | POA: Diagnosis not present

## 2019-08-10 DIAGNOSIS — N2581 Secondary hyperparathyroidism of renal origin: Secondary | ICD-10-CM | POA: Diagnosis not present

## 2019-08-10 DIAGNOSIS — N186 End stage renal disease: Secondary | ICD-10-CM | POA: Diagnosis not present

## 2019-08-10 DIAGNOSIS — D509 Iron deficiency anemia, unspecified: Secondary | ICD-10-CM | POA: Diagnosis not present

## 2019-08-10 DIAGNOSIS — Z992 Dependence on renal dialysis: Secondary | ICD-10-CM | POA: Diagnosis not present

## 2019-08-12 DIAGNOSIS — N2581 Secondary hyperparathyroidism of renal origin: Secondary | ICD-10-CM | POA: Diagnosis not present

## 2019-08-12 DIAGNOSIS — D509 Iron deficiency anemia, unspecified: Secondary | ICD-10-CM | POA: Diagnosis not present

## 2019-08-12 DIAGNOSIS — N186 End stage renal disease: Secondary | ICD-10-CM | POA: Diagnosis not present

## 2019-08-12 DIAGNOSIS — Z992 Dependence on renal dialysis: Secondary | ICD-10-CM | POA: Diagnosis not present

## 2019-08-14 DIAGNOSIS — N2581 Secondary hyperparathyroidism of renal origin: Secondary | ICD-10-CM | POA: Diagnosis not present

## 2019-08-14 DIAGNOSIS — D509 Iron deficiency anemia, unspecified: Secondary | ICD-10-CM | POA: Diagnosis not present

## 2019-08-14 DIAGNOSIS — Z992 Dependence on renal dialysis: Secondary | ICD-10-CM | POA: Diagnosis not present

## 2019-08-14 DIAGNOSIS — N186 End stage renal disease: Secondary | ICD-10-CM | POA: Diagnosis not present

## 2019-08-16 DIAGNOSIS — D509 Iron deficiency anemia, unspecified: Secondary | ICD-10-CM | POA: Diagnosis not present

## 2019-08-16 DIAGNOSIS — N186 End stage renal disease: Secondary | ICD-10-CM | POA: Diagnosis not present

## 2019-08-16 DIAGNOSIS — Z992 Dependence on renal dialysis: Secondary | ICD-10-CM | POA: Diagnosis not present

## 2019-08-16 DIAGNOSIS — N2581 Secondary hyperparathyroidism of renal origin: Secondary | ICD-10-CM | POA: Diagnosis not present

## 2019-08-18 DIAGNOSIS — N186 End stage renal disease: Secondary | ICD-10-CM | POA: Diagnosis not present

## 2019-08-18 DIAGNOSIS — N2581 Secondary hyperparathyroidism of renal origin: Secondary | ICD-10-CM | POA: Diagnosis not present

## 2019-08-18 DIAGNOSIS — Z992 Dependence on renal dialysis: Secondary | ICD-10-CM | POA: Diagnosis not present

## 2019-08-18 DIAGNOSIS — D509 Iron deficiency anemia, unspecified: Secondary | ICD-10-CM | POA: Diagnosis not present

## 2019-08-21 DIAGNOSIS — Z992 Dependence on renal dialysis: Secondary | ICD-10-CM | POA: Diagnosis not present

## 2019-08-21 DIAGNOSIS — N2581 Secondary hyperparathyroidism of renal origin: Secondary | ICD-10-CM | POA: Diagnosis not present

## 2019-08-21 DIAGNOSIS — D509 Iron deficiency anemia, unspecified: Secondary | ICD-10-CM | POA: Diagnosis not present

## 2019-08-21 DIAGNOSIS — N186 End stage renal disease: Secondary | ICD-10-CM | POA: Diagnosis not present

## 2019-08-24 DIAGNOSIS — D509 Iron deficiency anemia, unspecified: Secondary | ICD-10-CM | POA: Diagnosis not present

## 2019-08-24 DIAGNOSIS — Z992 Dependence on renal dialysis: Secondary | ICD-10-CM | POA: Diagnosis not present

## 2019-08-24 DIAGNOSIS — N2581 Secondary hyperparathyroidism of renal origin: Secondary | ICD-10-CM | POA: Diagnosis not present

## 2019-08-24 DIAGNOSIS — N186 End stage renal disease: Secondary | ICD-10-CM | POA: Diagnosis not present

## 2019-08-27 DIAGNOSIS — F250 Schizoaffective disorder, bipolar type: Secondary | ICD-10-CM | POA: Diagnosis not present

## 2019-08-27 DIAGNOSIS — F411 Generalized anxiety disorder: Secondary | ICD-10-CM | POA: Diagnosis not present

## 2019-10-27 ENCOUNTER — Emergency Department (HOSPITAL_COMMUNITY)
Admission: EM | Admit: 2019-10-27 | Discharge: 2019-10-27 | Disposition: A | Discharge status: Home / Self Care | Payer: Medicare Other | Attending: Emergency Medicine | Admitting: Emergency Medicine

## 2019-10-27 ENCOUNTER — Encounter (HOSPITAL_COMMUNITY): Payer: Self-pay | Admitting: *Deleted

## 2019-10-27 ENCOUNTER — Other Ambulatory Visit: Payer: Self-pay

## 2019-10-27 ENCOUNTER — Emergency Department (HOSPITAL_COMMUNITY): Payer: Medicare Other

## 2019-10-27 DIAGNOSIS — W010XXA Fall on same level from slipping, tripping and stumbling without subsequent striking against object, initial encounter: Secondary | ICD-10-CM | POA: Diagnosis not present

## 2019-10-27 DIAGNOSIS — Z79899 Other long term (current) drug therapy: Secondary | ICD-10-CM | POA: Diagnosis not present

## 2019-10-27 DIAGNOSIS — Y9301 Activity, walking, marching and hiking: Secondary | ICD-10-CM | POA: Insufficient documentation

## 2019-10-27 DIAGNOSIS — Y92018 Other place in single-family (private) house as the place of occurrence of the external cause: Secondary | ICD-10-CM | POA: Insufficient documentation

## 2019-10-27 DIAGNOSIS — Y998 Other external cause status: Secondary | ICD-10-CM | POA: Diagnosis not present

## 2019-10-27 DIAGNOSIS — S63259A Unspecified dislocation of unspecified finger, initial encounter: Secondary | ICD-10-CM

## 2019-10-27 DIAGNOSIS — S63257A Unspecified dislocation of left little finger, initial encounter: Secondary | ICD-10-CM | POA: Diagnosis not present

## 2019-10-27 DIAGNOSIS — S6992XA Unspecified injury of left wrist, hand and finger(s), initial encounter: Secondary | ICD-10-CM | POA: Diagnosis present

## 2019-10-27 MED ORDER — LIDOCAINE HCL (PF) 2 % IJ SOLN
INTRAMUSCULAR | Status: AC
  Filled 2019-10-27: qty 10

## 2019-10-27 MED ORDER — POVIDONE-IODINE 10 % EX SOLN: CUTANEOUS | Status: DC | PRN

## 2019-10-27 MED ORDER — LIDOCAINE HCL (PF) 2 % IJ SOLN: 10.0000 mL | Freq: Once | INTRAMUSCULAR | Status: DC

## 2019-10-27 NOTE — ED Triage Notes (Signed)
Pt tripped on pavement and fell today, landed on left side, same arm as dialysis, left pinky finger deformed as well.

## 2019-10-27 NOTE — Discharge Instructions (Addendum)
Your previous finger dislocation has been set back in place.  Call Dr. Ruthe Mannan office tomorrow to arrange a follow-up appointment.  Keep the splint in place until you are seen.  You may take Tylenol 500 mg every 4-6 hours if needed for pain.

## 2019-10-27 NOTE — ED Provider Notes (Signed)
[facility] EMERGENCY DEPARTMENT Provider Note   CSN: [id number] Arrival date & time: 10/27/19  1527     History Chief Complaint  Patient presents with  . Fall    Madison [name] is a 69 y.o. female.  HPI      Madison [name] is a 69 y.o. female with hx of ESRD on dialysis (M,W,F) and schizophrenia, who presents to the Emergency Department complaining of pain and deformity of the left fifth finger.  She states that she was attempting to go outside and slipped as she stepped out the door.  She grabbed the edge of the door to try to break her fall and caught her finger on the door.  She reports the injury occurred shortly before ER arrival.  She states that her finger pain is mild.  She denies head injury, neck or back pain, LOC, dizziness and other injuries.  She also denies numbness of her hand or finger.      Past Medical History:  Diagnosis Date  . Anxiety   . ESRD (end stage renal disease) on dialysis ([place])   . Neuropathy   . Schizophrenia [facility])     Patient Active Problem List   Diagnosis Date Noted  . Schizoaffective disorder, bipolar type ([place])   . Bipolar affective disorder, current episode manic with psychotic symptoms ([place]) 12/25/2018  . End stage renal disease ([place]) 12/25/2018  . History of adenomatous polyp of colon 11/20/2018  . Loss of weight 10/17/2017  . Hypernatremia 04/17/2016  . CKD (chronic kidney disease) stage 3, GFR 30-59 ml/min 04/16/2016  . Normocytic anemia 04/16/2016  . Meningioma ([place]) 04/16/2016  . Acute psychosis ([place]) 04/15/2016  . Hyponatremia 04/15/2016  . AKI (acute kidney injury) ([place]) 04/15/2016  . Special screening for malignant neoplasms, colon   . Thrombocytopenia ([employer]) 01/05/2016  . Altered mental status 04/19/2015    Past Surgical History:  Procedure Laterality Date  . CHOLECYSTECTOMY    . COLONOSCOPY N/A 01/20/2016   Dr. Oneida Alar: 12 mm tubular adenoma removed from the transverse colon, 4 hyperplastic polyps removed from the  rectum and sigmoid colon.  Next colonoscopy planned for April 2020.  Marland Kitchen ESOPHAGOGASTRODUODENOSCOPY N/A 12/19/2017   Procedure: ESOPHAGOGASTRODUODENOSCOPY (EGD);  Surgeon: [name], MD;  Location: [facility] ENDO SUITE;  Service: Endoscopy;  Laterality: N/A;  8:30am  . fistula left arm    . GIVENS CAPSULE STUDY N/A 01/14/2018   Procedure: GIVENS CAPSULE STUDY;  Surgeon: [name], MD;  Location: [facility] ENDO SUITE;  Service: Endoscopy;  Laterality: N/A;  7:30am  . HEMICOLECTOMY Right   . TUBAL LIGATION       OB History    Gravida  0   Para  0   Term  0   Preterm  0   AB  0   Living  0     SAB  0   TAB  0   Ectopic  0   Multiple  0   Live Births  0           Family History  Problem Relation Age of Onset  . Alzheimer's disease Mother   . Cancer Mother        pancreatic  . Prostate cancer Father   . Kidney failure Sister   . Breast cancer Neg Hx   . Colon cancer Neg Hx     Social History   Tobacco Use  . Smoking status: Never Smoker  . Smokeless tobacco: Never Used  Substance Use  Topics  . Alcohol use: No  . Drug use: No    Home Medications Prior to Admission medications   Medication Sig Start Date End Date Taking? Authorizing Provider  amLODipine (NORVASC) 5 MG tablet Take 1 tablet (5 mg total) by mouth daily. 01/09/19   Clapacs, Madie Reno, MD  divalproex (DEPAKOTE) 250 MG DR tablet Take 1 tablet (250 mg total) by mouth 3 (three) times daily. 01/09/19   Clapacs, Madie Reno, MD  epoetin alfa (EPOGEN,PROCRIT) 99833 UNIT/ML injection Inject 1 mL (10,000 Units total) into the vein every Monday, Wednesday, and Friday with hemodialysis. 01/09/19   Clapacs, Madie Reno, MD  ferric citrate (AURYXIA) 1 GM 210 MG(Fe) tablet Take 210 mg by mouth 3 (three) times daily with meals.     [provider]  gabapentin (NEURONTIN) 100 MG capsule Take 1 capsule (100 mg total) by mouth 2 (two) times daily. 01/08/19   Clapacs, Madie Reno, MD  Multiple Vitamin (MULTIVITAMIN WITH MINERALS)  TABS tablet Take 1 tablet by mouth daily. 01/09/19   Clapacs, Madie Reno, MD  OLANZapine (ZYPREXA) 20 MG tablet Take 1 tablet (20 mg total) by mouth at bedtime. 01/09/19   Clapacs, Madie Reno, MD  OLANZapine (ZYPREXA) 5 MG tablet Take 1 tablet (5 mg total) by mouth daily. 01/09/19   Clapacs, Madie Reno, MD  pantoprazole (PROTONIX) 40 MG tablet TAKE 1 TABLET BY MOUTH ONCE DAILY. 05/26/19   Annitta Needs, NP  senna (SENOKOT) 8.6 MG TABS tablet Take 1-2 tablets (8.6-17.2 mg total) by mouth at bedtime as needed for mild constipation or moderate constipation. 01/08/19   Clapacs, Madie Reno, MD  sodium zirconium cyclosilicate (LOKELMA) 10 g PACK packet Take 10 g by mouth daily. 01/09/19   Clapacs, Madie Reno, MD  temazepam (RESTORIL) 15 MG capsule Take 1 capsule (15 mg total) by mouth at bedtime. 01/08/19   Clapacs, Madie Reno, MD  thiamine (VITAMIN B-1) 100 MG tablet Take 0.5 tablets (50 mg total) by mouth daily. 01/08/19   Clapacs, Madie Reno, MD  vitamin B-12 1000 MCG tablet Take 1 tablet (1,000 mcg total) by mouth daily. 01/08/19   Clapacs, Madie Reno, MD    Allergies    Patient has no known allergies.  Review of Systems   Review of Systems  Constitutional: Negative for chills and fever.  Respiratory: Negative for shortness of breath.   Cardiovascular: Negative for chest pain.  Gastrointestinal: Negative for abdominal pain, nausea and vomiting.  Musculoskeletal: Positive for arthralgias (left fifth finger pain and deformity). Negative for back pain, joint swelling and neck pain.  Skin: Negative for color change and wound.  Neurological: Negative for dizziness, syncope, weakness, numbness and headaches.  Psychiatric/Behavioral: Negative for confusion and decreased concentration.    Physical Exam Updated Vital Signs BP (!) 108/57   Pulse 81   Temp 98.2 F (36.8 C) (Oral)   Resp 20   Ht 5\' 3"  (1.6 m)   Wt 54 kg   SpO2 94%   BMI 21.09 kg/m   Physical Exam Vitals and nursing note reviewed.  Constitutional:      Appearance:  Normal appearance. She is not ill-appearing.  HENT:     Head: Atraumatic.  Cardiovascular:     Rate and Rhythm: Normal rate and regular rhythm.     Pulses: Normal pulses.  Pulmonary:     Effort: Pulmonary effort is normal.     Breath sounds: Normal breath sounds.  Chest:     Chest wall: Tenderness present.  Abdominal:  Palpations: Abdomen is soft.     Tenderness: There is no abdominal tenderness.  Musculoskeletal:        General: Swelling, tenderness, deformity and signs of injury present.     Cervical back: Normal range of motion. No tenderness.     Comments: Close deformity noted at the PIP joint of the left fifth finger.  Left hand and wrist are nontender.  No edema of the finger or hand.  Fingernail is without injury.  Skin:    General: Skin is warm.     Capillary Refill: Capillary refill takes less than 2 seconds.     Findings: No erythema or rash.  Neurological:     General: No focal deficit present.     Mental Status: She is alert.     Sensory: No sensory deficit.     Motor: No weakness.     ED Results / Procedures / Treatments   Labs (all labs ordered are listed, but only abnormal results are displayed) Labs Reviewed - No data to display  EKG None  Radiology  Post reduction DG Finger Little Left  Result Date: 10/27/2019 CLINICAL DATA:  Status post reduction of finger dislocation. EXAM: LEFT LITTLE FINGER 2+V COMPARISON:  Radiographs of same day. FINDINGS: Successful reduction of previously described dislocation involving fifth middle phalanx. No fracture is noted. Mild soft tissue swelling is noted. IMPRESSION: Successful reduction of previously described dislocation involving fifth middle phalanx. Electronically Signed   By: Marijo Conception M.D.   On: 10/27/2019 18:49   DG Finger Little Left  Result Date: 10/27/2019 CLINICAL DATA:  69 year old female with fall and deformity of the pinky finger. EXAM: LEFT LITTLE FINGER 2+V COMPARISON:  None. FINDINGS: No  definite acute fracture identified. The bones are osteopenic. There is complete dislocation at the PIP joint of the fifth digit. The middle and distal phalanges of the fifth digit are dislocated to the ulnar side of the proximal digit. The soft tissues are unremarkable. IMPRESSION: 1. No acute fracture. 2. Dislocation of the PIP joint of the fifth digit. Electronically Signed   By: Anner Crete M.D.   On: 10/27/2019 17:01    Procedures .Ortho Injury Treatment  Date/Time: 10/27/2019 6:30 PM Performed by: Kem Parkinson, PA-C Authorized by: Kem Parkinson, PA-C   Consent:    Consent obtained:  Verbal   Consent given by:  Patient   Risks discussed:  Vascular damage, nerve damage, irreducible dislocation, recurrent dislocation and fractureInjury location: finger Location details: left little finger Injury type: dislocation Dislocation type: PIP Pre-procedure neurovascular assessment: neurovascularly intact Pre-procedure distal perfusion: normal Pre-procedure neurological function: normal Pre-procedure range of motion: reduced Anesthesia: digital block  Anesthesia: Local anesthesia used: yes Local Anesthetic: lidocaine 2% without epinephrine Anesthetic total: 2 mL  Patient sedated: NoManipulation performed: yes Reduction successful: yes X-ray confirmed reduction: yes Immobilization: splint Splint type: static finger Supplies used: aluminum splint and elastic bandage Post-procedure neurovascular assessment: post-procedure neurovascularly intact Post-procedure distal perfusion: normal Post-procedure neurological function: normal Post-procedure range of motion: normal Patient tolerance: patient tolerated the procedure well with no immediate complications    (including critical care time)   Medications Ordered in ED Medications  povidone-iodine (BETADINE) 10 % external solution (has no administration in time range)  lidocaine (XYLOCAINE) 2 % injection 10 mL (has no  administration in time range)  lidocaine (XYLOCAINE) 2 % injection (has no administration in time range)    ED Course  I have reviewed the triage vital signs and the nursing notes.  Pertinent labs & imaging results that were available during my care of the patient were reviewed by me and considered in my medical decision making (see chart for details).    MDM Rules/Calculators/A&P                      Pt with mechanical fall today that resulted in a dislocation to her left fifth finger.  NV intact.  No open wounds and nail is without injury.  Successful reduction confirmed with x-ray.  Finger splinted by nursing staff. Remains NV intact on recheck.     Patient agrees to orthopedic follow-up, she prefers local orthopedics.  Referral information provided.  She agrees to take Tylenol if needed for pain.  Final Clinical Impression(s) / ED Diagnoses Final diagnoses:  Closed dislocation of finger, initial encounter    Rx / DC Orders ED Discharge Orders    None       Bufford Lope 10/27/19 2100    Dorie Rank, MD 10/28/19 1059

## 2019-10-29 ENCOUNTER — Ambulatory Visit (INDEPENDENT_AMBULATORY_CARE_PROVIDER_SITE_OTHER): Payer: Medicare Other | Admitting: Orthopaedic Surgery

## 2019-10-29 ENCOUNTER — Other Ambulatory Visit: Payer: Self-pay

## 2019-10-29 ENCOUNTER — Encounter: Payer: Self-pay | Admitting: Orthopaedic Surgery

## 2019-10-29 VITALS — BP 89/52 | HR 74 | Temp 98.0°F | Ht 63.0 in | Wt 126.0 lb

## 2019-10-29 DIAGNOSIS — W19XXXA Unspecified fall, initial encounter: Secondary | ICD-10-CM | POA: Diagnosis not present

## 2019-10-29 DIAGNOSIS — S63287A Dislocation of proximal interphalangeal joint of left little finger, initial encounter: Secondary | ICD-10-CM | POA: Diagnosis not present

## 2019-10-29 DIAGNOSIS — Y92019 Unspecified place in single-family (private) house as the place of occurrence of the external cause: Secondary | ICD-10-CM | POA: Diagnosis not present

## 2019-10-29 NOTE — Progress Notes (Signed)
Subjective:    Patient ID: Madison [name], female    DOB: 1951-04-28, 69 y.o.   MRN: [id number]  HPI She dislocated the left little finger at the PIP joint 10-27-2019 in fall at her home. She was seen in the ER.  It was reduced.  She was placed in a splint.  She has no other injury.  I have independently reviewed and interpreted x-rays of this patient done at another site by another physician or qualified health professional.  I have reviewed the ER report as well.  She has no numbness.   Review of Systems  Constitutional: Positive for activity change.  Genitourinary:       On dialysis  All other systems reviewed and are negative.  For Review of Systems, all other systems reviewed and are negative.  The following is a summary of the past history medically, past history surgically, known current medicines, social history and family history.  This information is gathered electronically by the computer from prior information and documentation.  I review this each visit and have found including this information at this point in the chart is beneficial and informative.   Past Medical History:  Diagnosis Date  . Anxiety   . ESRD (end stage renal disease) on dialysis ([place])   . Neuropathy   . Schizophrenia [facility])     Past Surgical History:  Procedure Laterality Date  . CHOLECYSTECTOMY    . COLONOSCOPY N/A 01/20/2016   Dr. Oneida Alar: 12 mm tubular adenoma removed from the transverse colon, 4 hyperplastic polyps removed from the rectum and sigmoid colon.  Next colonoscopy planned for April 2020.  Marland Kitchen ESOPHAGOGASTRODUODENOSCOPY N/A 12/19/2017   Procedure: ESOPHAGOGASTRODUODENOSCOPY (EGD);  Surgeon: [name], MD;  Location: [facility] ENDO SUITE;  Service: Endoscopy;  Laterality: N/A;  8:30am  . fistula left arm    . GIVENS CAPSULE STUDY N/A 01/14/2018   Procedure: GIVENS CAPSULE STUDY;  Surgeon: [name], MD;  Location: [facility] ENDO SUITE;  Service: Endoscopy;  Laterality: N/A;  7:30am  .  HEMICOLECTOMY Right   . TUBAL LIGATION      Current Outpatient Medications on File Prior to Visit  Medication Sig Dispense Refill  . amLODipine (NORVASC) 5 MG tablet Take 1 tablet (5 mg total) by mouth daily. 30 tablet 0  . divalproex (DEPAKOTE) 250 MG DR tablet Take 1 tablet (250 mg total) by mouth 3 (three) times daily. 21 tablet 0  . epoetin alfa (EPOGEN,PROCRIT) 22025 UNIT/ML injection Inject 1 mL (10,000 Units total) into the vein every Monday, Wednesday, and Friday with hemodialysis. 1 mL 1  . ferric citrate (AURYXIA) 1 GM 210 MG(Fe) tablet Take 210 mg by mouth 3 (three) times daily with meals.     . gabapentin (NEURONTIN) 100 MG capsule Take 1 capsule (100 mg total) by mouth 2 (two) times daily. 60 capsule 1  . Multiple Vitamin (MULTIVITAMIN WITH MINERALS) TABS tablet Take 1 tablet by mouth daily. 30 tablet 0  . OLANZapine (ZYPREXA) 20 MG tablet Take 1 tablet (20 mg total) by mouth at bedtime. 7 tablet 0  . OLANZapine (ZYPREXA) 5 MG tablet Take 1 tablet (5 mg total) by mouth daily. 7 tablet 0  . pantoprazole (PROTONIX) 40 MG tablet TAKE 1 TABLET BY MOUTH ONCE DAILY. 90 tablet 3  . senna (SENOKOT) 8.6 MG TABS tablet Take 1-2 tablets (8.6-17.2 mg total) by mouth at bedtime as needed for mild constipation or moderate constipation. 120 each 1  . sodium zirconium cyclosilicate (LOKELMA)  10 g PACK packet Take 10 g by mouth daily. 30 packet 0  . temazepam (RESTORIL) 15 MG capsule Take 1 capsule (15 mg total) by mouth at bedtime. 30 capsule 1  . thiamine (VITAMIN B-1) 100 MG tablet Take 0.5 tablets (50 mg total) by mouth daily. 30 tablet 0  . vitamin B-12 1000 MCG tablet Take 1 tablet (1,000 mcg total) by mouth daily. 30 tablet 1   No current facility-administered medications on file prior to visit.    Social History   Socioeconomic History  . Marital status: Single    Spouse name: Not on file  . Number of children: Not on file  . Years of education: Not on file  . Highest education  level: Not on file  Occupational History  . Not on file  Tobacco Use  . Smoking status: Never Smoker  . Smokeless tobacco: Never Used  Substance and Sexual Activity  . Alcohol use: No  . Drug use: No  . Sexual activity: Yes  Other Topics Concern  . Not on file  Social History Narrative  . Not on file   Social Determinants of Health   Financial Resource Strain:   . Difficulty of Paying Living Expenses: Not on file  Food Insecurity:   . Worried About Charity fundraiser in the Last Year: Not on file  . Ran Out of Food in the Last Year: Not on file  Transportation Needs:   . Lack of Transportation (Medical): Not on file  . Lack of Transportation (Non-Medical): Not on file  Physical Activity:   . Days of Exercise per Week: Not on file  . Minutes of Exercise per Session: Not on file  Stress:   . Feeling of Stress : Not on file  Social Connections:   . Frequency of Communication with Friends and Family: Not on file  . Frequency of Social Gatherings with Friends and Family: Not on file  . Attends Religious Services: Not on file  . Active Member of Clubs or Organizations: Not on file  . Attends Archivist Meetings: Not on file  . Marital Status: Not on file  Intimate Partner Violence:   . Fear of Current or Ex-Partner: Not on file  . Emotionally Abused: Not on file  . Physically Abused: Not on file  . Sexually Abused: Not on file    Family History  Problem Relation Age of Onset  . Alzheimer's disease Mother   . Cancer Mother        pancreatic  . Diabetes Mother   . Prostate cancer Father   . Kidney failure Sister   . Breast cancer Neg Hx   . Colon cancer Neg Hx     BP (!) 89/52   Pulse 74   Temp 98 F (36.7 C)   Ht 5\' 3"  (1.6 m)   Wt 126 lb (57.2 kg)   BMI 22.32 kg/m   Body mass index is 22.32 kg/m.      Objective:   Physical Exam Vitals and nursing note reviewed.  Constitutional:      Appearance: She is well-developed.  HENT:     Head:  Normocephalic and atraumatic.  Eyes:     Conjunctiva/sclera: Conjunctivae normal.     Pupils: Pupils are equal, round, and reactive to light.  Cardiovascular:     Rate and Rhythm: Normal rate and regular rhythm.  Pulmonary:     Effort: Pulmonary effort is normal.  Abdominal:     Palpations: Abdomen  is soft.  Musculoskeletal:       Hands:     Cervical back: Normal range of motion and neck supple.  Skin:    General: Skin is warm and dry.  Neurological:     Mental Status: She is alert and oriented to person, place, and time.     Cranial Nerves: No cranial nerve deficit.     Motor: No abnormal muscle tone.     Coordination: Coordination normal.     Deep Tendon Reflexes: Reflexes are normal and symmetric. Reflexes normal.  Psychiatric:        Behavior: Behavior normal.        Thought Content: Thought content normal.        Judgment: Judgment normal.           Assessment & Plan:   Encounter Diagnosis  Name Primary?  . Dislocation of proximal interphalangeal joint of left little finger, initial encounter Yes   A new splint applied.  Return in two weeks.  Call if any problem.  Precautions discussed.   X-rays on return of the left little finger.  Electronically Signed Sanjuana Kava, MD 2/4/202110:54 AM

## 2019-11-11 ENCOUNTER — Encounter: Payer: Self-pay | Admitting: Orthopaedic Surgery

## 2019-11-11 ENCOUNTER — Ambulatory Visit: Payer: Medicare Other | Admitting: Orthopaedic Surgery

## 2019-12-10 ENCOUNTER — Ambulatory Visit: Payer: Medicare Other | Admitting: Orthopaedic Surgery

## 2020-04-05 ENCOUNTER — Other Ambulatory Visit: Payer: Self-pay

## 2020-04-05 ENCOUNTER — Emergency Department: Payer: Medicare Other

## 2020-04-05 ENCOUNTER — Inpatient Hospital Stay
Admission: EM | Admit: 2020-04-05 | Discharge: 2020-04-18 | DRG: 193 | Disposition: A | Discharge status: Home Health | Payer: Medicare Other | Attending: Hospitalist | Admitting: Hospitalist

## 2020-04-05 DIAGNOSIS — I214 Non-ST elevation (NSTEMI) myocardial infarction: Secondary | ICD-10-CM

## 2020-04-05 DIAGNOSIS — R4182 Altered mental status, unspecified: Secondary | ICD-10-CM

## 2020-04-05 DIAGNOSIS — G9341 Metabolic encephalopathy: Secondary | ICD-10-CM | POA: Diagnosis present

## 2020-04-05 DIAGNOSIS — A419 Sepsis, unspecified organism: Secondary | ICD-10-CM

## 2020-04-05 DIAGNOSIS — Z992 Dependence on renal dialysis: Secondary | ICD-10-CM | POA: Diagnosis not present

## 2020-04-05 DIAGNOSIS — R778 Other specified abnormalities of plasma proteins: Secondary | ICD-10-CM | POA: Diagnosis present

## 2020-04-05 DIAGNOSIS — M6282 Rhabdomyolysis: Secondary | ICD-10-CM | POA: Diagnosis not present

## 2020-04-05 DIAGNOSIS — Z833 Family history of diabetes mellitus: Secondary | ICD-10-CM | POA: Diagnosis not present

## 2020-04-05 DIAGNOSIS — G629 Polyneuropathy, unspecified: Secondary | ICD-10-CM | POA: Diagnosis not present

## 2020-04-05 DIAGNOSIS — F250 Schizoaffective disorder, bipolar type: Secondary | ICD-10-CM | POA: Diagnosis not present

## 2020-04-05 DIAGNOSIS — E872 Acidosis, unspecified: Secondary | ICD-10-CM

## 2020-04-05 DIAGNOSIS — F332 Major depressive disorder, recurrent severe without psychotic features: Secondary | ICD-10-CM | POA: Diagnosis not present

## 2020-04-05 DIAGNOSIS — E875 Hyperkalemia: Secondary | ICD-10-CM | POA: Diagnosis present

## 2020-04-05 DIAGNOSIS — Z79899 Other long term (current) drug therapy: Secondary | ICD-10-CM

## 2020-04-05 DIAGNOSIS — J189 Pneumonia, unspecified organism: Principal | ICD-10-CM | POA: Diagnosis present

## 2020-04-05 DIAGNOSIS — I248 Other forms of acute ischemic heart disease: Secondary | ICD-10-CM | POA: Diagnosis not present

## 2020-04-05 DIAGNOSIS — F411 Generalized anxiety disorder: Secondary | ICD-10-CM | POA: Diagnosis not present

## 2020-04-05 DIAGNOSIS — Z20822 Contact with and (suspected) exposure to covid-19: Secondary | ICD-10-CM | POA: Diagnosis present

## 2020-04-05 DIAGNOSIS — E871 Hypo-osmolality and hyponatremia: Secondary | ICD-10-CM | POA: Diagnosis present

## 2020-04-05 DIAGNOSIS — Z820 Family history of epilepsy and other diseases of the nervous system: Secondary | ICD-10-CM

## 2020-04-05 DIAGNOSIS — I160 Hypertensive urgency: Secondary | ICD-10-CM | POA: Diagnosis present

## 2020-04-05 DIAGNOSIS — K219 Gastro-esophageal reflux disease without esophagitis: Secondary | ICD-10-CM | POA: Diagnosis not present

## 2020-04-05 DIAGNOSIS — N186 End stage renal disease: Secondary | ICD-10-CM | POA: Diagnosis present

## 2020-04-05 DIAGNOSIS — N2581 Secondary hyperparathyroidism of renal origin: Secondary | ICD-10-CM | POA: Diagnosis not present

## 2020-04-05 DIAGNOSIS — Z841 Family history of disorders of kidney and ureter: Secondary | ICD-10-CM | POA: Diagnosis not present

## 2020-04-05 DIAGNOSIS — D631 Anemia in chronic kidney disease: Secondary | ICD-10-CM | POA: Diagnosis present

## 2020-04-05 DIAGNOSIS — I120 Hypertensive chronic kidney disease with stage 5 chronic kidney disease or end stage renal disease: Secondary | ICD-10-CM | POA: Diagnosis present

## 2020-04-05 DIAGNOSIS — T50904A Poisoning by unspecified drugs, medicaments and biological substances, undetermined, initial encounter: Secondary | ICD-10-CM

## 2020-04-05 DIAGNOSIS — R34 Anuria and oliguria: Secondary | ICD-10-CM | POA: Diagnosis present

## 2020-04-05 LAB — COMPREHENSIVE METABOLIC PANEL
ALT: 27 U/L (ref 0–44)
AST: 69 U/L — ABNORMAL HIGH (ref 15–41)
Albumin: 4.1 g/dL (ref 3.5–5.0)
Alkaline Phosphatase: 88 U/L (ref 38–126)
Anion gap: 18 — ABNORMAL HIGH (ref 5–15)
BUN: 43 mg/dL — ABNORMAL HIGH (ref 8–23)
CO2: 29 mmol/L (ref 22–32)
Calcium: 9.7 mg/dL (ref 8.9–10.3)
Chloride: 94 mmol/L — ABNORMAL LOW (ref 98–111)
Creatinine, Ser: 7.37 mg/dL — ABNORMAL HIGH (ref 0.44–1.00)
GFR calc Af Amer: 6 mL/min — ABNORMAL LOW (ref >60)
GFR calc non Af Amer: 5 mL/min — ABNORMAL LOW (ref >60)
Glucose, Bld: 90 mg/dL (ref 70–99)
Potassium: 4.2 mmol/L (ref 3.5–5.1)
Sodium: 141 mmol/L (ref 135–145)
Total Bilirubin: 0.8 mg/dL (ref 0.3–1.2)
Total Protein: 8.3 g/dL — ABNORMAL HIGH (ref 6.5–8.1)

## 2020-04-05 LAB — CBC
HCT: 37.9 % (ref 36.0–46.0)
Hemoglobin: 12.8 g/dL (ref 12.0–15.0)
MCH: 32.4 pg (ref 26.0–34.0)
MCHC: 33.8 g/dL (ref 30.0–36.0)
MCV: 95.9 fL (ref 80.0–100.0)
Platelets: 198 10*3/uL (ref 150–400)
RBC: 3.95 MIL/uL (ref 3.87–5.11)
RDW: 16.9 % — ABNORMAL HIGH (ref 11.5–15.5)
WBC: 10.7 10*3/uL — ABNORMAL HIGH (ref 4.0–10.5)
nRBC: 0 % (ref 0.0–0.2)

## 2020-04-05 NOTE — ED Triage Notes (Addendum)
First nurse note- pt here, was found on sidewalk.  Unknown down time.  Family reports 3 days from pill box missing and empty pill bottles found.  Dialysis pt.  More confused than normal.  Family remains with pt.  CBG 118. Arrived [facility].  VSS with EMS

## 2020-04-05 NOTE — ED Triage Notes (Addendum)
Pt arrives to ED via [facility] with c/o AMS. Per EMS , pt was found on a sidewalk after being on the ground for an unknown amount of time. Pt is unable to provide any history or details; reports of a possible mental or intellectual disability. Pt is alert, but orientation is obviously questionable. Pt is in NAD; RR even, regular, and unlabored.

## 2020-04-05 NOTE — ED Provider Notes (Signed)
[facility] Emergency Department Provider Note   ____________________________________________   First MD Initiated Contact with Patient 04/05/20 2329     (approximate)  I have reviewed the triage vital signs and the nursing notes.   HISTORY  Chief Complaint Altered Mental Status  Level V caveat: Limited by confusion History obtained by patient sister  HPI Madison [name] is a 69 y.o. female brought to the ED via EMS from home with a chief complaint of altered mentation.  Patient was found outdoors in the neighbors yard for an unknown amount of time.  Sister lives with patient but she was taking a nap.  Thinks that most it would have been several hours that patient was seen outside.  Patient does not recall the events.  Sister states patient has appeared more confused recently and is unsure whether she is taking her psychiatric medications or has taken too much.  Sister denies recent illnesses.  Denies recent fever, cough, chest pain, shortness of breath, abdominal pain, nausea or vomiting.  Sister states patient does not make urine.  She is a dialysis patient M/W/F; last fully dialyzed on Monday.      Past Medical History:  Diagnosis Date  . Anxiety   . ESRD (end stage renal disease) on dialysis ([place])   . Neuropathy   . Schizophrenia [facility])     Patient Active Problem List   Diagnosis Date Noted  . Acute metabolic encephalopathy 15/17/6160  . Rhabdomyolysis 04/06/2020  . Elevated troponin 04/06/2020  . Pneumonia 04/06/2020  . Lactic acidosis 04/06/2020  . Suspected Sepsis ([place]) 04/06/2020  . Possible NSTEMI (non-ST elevated myocardial infarction) ([place]) 04/06/2020  .  Suspected Overdose, undetermined intent, initial encounter 04/06/2020  . Schizoaffective disorder, bipolar type ([place])   . Bipolar affective disorder, current episode manic with psychotic symptoms ([place]) 12/25/2018  . ESRD on hemodialysis ([place]) 12/25/2018  . History of adenomatous polyp  of colon 11/20/2018  . Loss of weight 10/17/2017  . Hypernatremia 04/17/2016  . CKD (chronic kidney disease) stage 3, GFR 30-59 ml/min 04/16/2016  . Normocytic anemia 04/16/2016  . Meningioma ([place]) 04/16/2016  . Acute psychosis ([place]) 04/15/2016  . Hyponatremia 04/15/2016  . AKI (acute kidney injury) ([place]) 04/15/2016  . Special screening for malignant neoplasms, colon   . Thrombocytopenia ([place]) 01/05/2016  . Altered mental status 04/19/2015    Past Surgical History:  Procedure Laterality Date  . CHOLECYSTECTOMY    . COLONOSCOPY N/A 01/20/2016   Dr. Oneida Alar: 12 mm tubular adenoma removed from the transverse colon, 4 hyperplastic polyps removed from the rectum and sigmoid colon.  Next colonoscopy planned for April 2020.  Marland Kitchen ESOPHAGOGASTRODUODENOSCOPY N/A 12/19/2017   Procedure: ESOPHAGOGASTRODUODENOSCOPY (EGD);  Surgeon: [name], MD;  Location: [facility];  Service: Endoscopy;  Laterality: N/A;  8:30am  . fistula left arm    . GIVENS CAPSULE STUDY N/A 01/14/2018   Procedure: GIVENS CAPSULE STUDY;  Surgeon: [name], MD;  Location: [facility] ENDO SUITE;  Service: Endoscopy;  Laterality: N/A;  7:30am  . HEMICOLECTOMY Right   . TUBAL LIGATION      Prior to Admission medications   Medication Sig Start Date End Date Taking? Authorizing Provider  B Complex-C-Folic Acid (DIALYVITE TABLET) TABS Take 1 tablet by mouth daily. 03/31/20  Yes [provider]  divalproex (DEPAKOTE) 250 MG DR tablet Take 1 tablet (250 mg total) by mouth 3 (three) times daily. Patient taking differently: Take 250 mg by mouth 2 (two) times daily.  01/09/19  Yes Clapacs, Madie Reno, MD  epoetin alfa (EPOGEN,PROCRIT) 14103 UNIT/ML injection Inject 1 mL (10,000 Units total) into the vein every Monday, Wednesday, and Friday with hemodialysis. 01/09/19  Yes Clapacs, Madie Reno, MD  ferric citrate (AURYXIA) 1 GM 210 MG(Fe) tablet Take 210 mg by mouth 3 (three) times daily with meals.    Yes [provider]    gabapentin (NEURONTIN) 100 MG capsule Take 1 capsule (100 mg total) by mouth 2 (two) times daily. 01/08/19  Yes Clapacs, Madie Reno, MD  OLANZapine (ZYPREXA) 15 MG tablet Take 15 mg by mouth at bedtime. 03/31/20  Yes [provider]  pantoprazole (PROTONIX) 40 MG tablet TAKE 1 TABLET BY MOUTH ONCE DAILY. 05/26/19  Yes Annitta Needs, NP  sodium zirconium cyclosilicate (LOKELMA) 10 g PACK packet Take 10 g by mouth daily. 01/09/19  Yes Clapacs, Madie Reno, MD  thiamine (VITAMIN B-1) 100 MG tablet Take 0.5 tablets (50 mg total) by mouth daily. 01/08/19  Yes Clapacs, Madie Reno, MD  senna (SENOKOT) 8.6 MG TABS tablet Take 1-2 tablets (8.6-17.2 mg total) by mouth at bedtime as needed for mild constipation or moderate constipation. Patient not taking: Reported on 04/06/2020 01/08/19   Clapacs, Madie Reno, MD    Allergies Patient has no known allergies.  Family History  Problem Relation Age of Onset  . Alzheimer's disease Mother   . Cancer Mother        pancreatic  . Diabetes Mother   . Prostate cancer Father   . Kidney failure Sister   . Breast cancer Neg Hx   . Colon cancer Neg Hx     Social History Social History   Tobacco Use  . Smoking status: Never Smoker  . Smokeless tobacco: Never Used  Vaping Use  . Vaping Use: Never used  Substance Use Topics  . Alcohol use: No  . Drug use: No    Review of Systems  Constitutional: No fever/chills Eyes: No visual changes. ENT: No sore throat. Cardiovascular: Denies chest pain. Respiratory: Denies shortness of breath. Gastrointestinal: No abdominal pain.  No nausea, no vomiting.  No diarrhea.  No constipation. Genitourinary: Negative for dysuria. Musculoskeletal: Negative for back pain. Skin: Negative for rash. Neurological: Positive for confusion.  Negative for headaches, focal weakness or numbness.   ____________________________________________   PHYSICAL EXAM:  VITAL SIGNS: ED Triage Vitals  Enc Vitals Group     BP 04/05/20 1900 (!)  143/74     Pulse Rate 04/05/20 1900 96     Resp 04/05/20 1900 17     Temp 04/05/20 1900 98.4 F (36.9 C)     Temp Source 04/05/20 1900 Oral     SpO2 04/05/20 1900 95 %     Weight 04/05/20 1901 158 lb (71.7 kg)     Height --      Head Circumference --      Peak Flow --      Pain Score 04/05/20 1908 0     Pain Loc --      Pain Edu? --      Excl. in [place]? --     Constitutional: Alert and oriented.  Elderly appearing and in no acute distress. Eyes: Conjunctivae are normal. PERRL. EOMI. Head: Atraumatic. Nose: Atraumatic. Mouth/Throat: Mucous membranes are mildly dry.  No dental malocclusion.   Neck: No stridor.  No cervical spine tenderness to palpation. Cardiovascular: Normal rate, regular rhythm. Grossly normal heart sounds.  Good peripheral circulation. Respiratory: Normal respiratory effort.  No  retractions. Lungs CTAB. Gastrointestinal: Soft and nontender to light or deep palpation. No distention. No abdominal bruits. No CVA tenderness. Musculoskeletal: No lower extremity tenderness nor edema.  No joint effusions. Neurologic: Alert and oriented to self only.  Mumbling speech and language. No gross focal neurologic deficits are appreciated. MAEx4. Skin:  Skin is warm, dry and intact. No rash noted.  No petechiae. Psychiatric: Confused.  ____________________________________________   LABS (all labs ordered are listed, but only abnormal results are displayed)  Labs Reviewed  CBC - Abnormal; Notable for the following components:      Result Value   WBC 10.7 (*)    RDW 16.9 (*)    All other components within normal limits  COMPREHENSIVE METABOLIC PANEL - Abnormal; Notable for the following components:   Chloride 94 (*)    BUN 43 (*)    Creatinine, Ser 7.37 (*)    Total Protein 8.3 (*)    AST 69 (*)    GFR calc non Af Amer 5 (*)    GFR calc Af Amer 6 (*)    Anion gap 18 (*)    All other components within normal limits  CK - Abnormal; Notable for the following components:     Total CK 1,711 (*)    All other components within normal limits  LACTIC ACID, PLASMA - Abnormal; Notable for the following components:   Lactic Acid, Venous 2.8 (*)    All other components within normal limits  TROPONIN I (HIGH SENSITIVITY) - Abnormal; Notable for the following components:   Troponin I (High Sensitivity) 158 (*)    All other components within normal limits  SARS CORONAVIRUS 2 BY RT PCR (HOSPITAL ORDER, [facility])  CULTURE, BLOOD (ROUTINE X 2)  CULTURE, BLOOD (ROUTINE X 2)  VALPROIC ACID LEVEL  LACTIC ACID, PLASMA  URINALYSIS, COMPLETE (UACMP) WITH MICROSCOPIC  APTT  PROTIME-INR  HEPARIN LEVEL (UNFRACTIONATED)  HIV ANTIBODY (ROUTINE TESTING W REFLEX)  CBG MONITORING, ED  TROPONIN I (HIGH SENSITIVITY)   ____________________________________________  EKG  ED ECG REPORT I, Ariellah Faust J, the attending physician, personally viewed and interpreted this ECG.   Date: 04/05/2020  EKG Time: 1912  Rate: 99  Rhythm: normal EKG, normal sinus rhythm  Axis: LAD  Intervals:none  ST&T Change: Nonspecific  ____________________________________________  RADIOLOGY  ED MD interpretation: Low lung volumes with vague heterogeneous opacities concerning for atelectasis versus pneumonia  Official radiology report(s): DG Chest 1 View  Result Date: 04/05/2020 CLINICAL DATA:  Confusion. EXAM: CHEST  1 VIEW COMPARISON:  12/29/2018 FINDINGS: Low lung volumes. Upper normal heart size with mild aortic tortuosity. Vague heterogeneous opacities in both mid lower lung zones. No pneumothorax or significant pleural effusion. Vascular stent in the left upper arm. Bones are under mineralized. IMPRESSION: Low lung volumes with vague heterogeneous opacities in both mid and lower lung zones, atelectasis versus pneumonia, including atypical viral infection. Electronically Signed   By: Keith Rake M.D.   On: 04/05/2020 23:28   CT Head Wo Contrast  Result Date:  04/06/2020 CLINICAL DATA:  69 year old female with altered mental status. EXAM: CT HEAD WITHOUT CONTRAST TECHNIQUE: Contiguous axial images were obtained from the base of the skull through the vertex without intravenous contrast. COMPARISON:  Head CT dated 01/18/2019. FINDINGS: Brain: Mild age-related atrophy and chronic microvascular ischemic changes. A 2.0 x 1.8 cm right anterior parafalcine partially calcified meningioma similar to prior CT. No surrounding edema. There is no acute intracranial hemorrhage. No mass effect or midline  shift. No extra-axial fluid collection. Vascular: No hyperdense vessel or unexpected calcification. Skull: Normal. Negative for fracture or focal lesion. Sinuses/Orbits: No acute finding. Other: None IMPRESSION: 1. No acute intracranial pathology. 2. Mild age-related atrophy and chronic microvascular ischemic changes. 3. Stable right anterior parafalcine meningioma. No associated edema. Electronically Signed   By: Anner Crete M.D.   On: 04/06/2020 01:29    ____________________________________________   PROCEDURES  Procedure(s) performed (including Critical Care):  .1-3 Lead EKG Interpretation Performed by: Paulette Blanch, MD Authorized by: Paulette Blanch, MD     Interpretation: normal     ECG rate:  95   ECG rate assessment: normal     Rhythm: sinus rhythm     Ectopy: none     Conduction: normal   Comments:     Patient placed on cardiac monitor to evaluate for arrhythmias   CRITICAL CARE Performed by: Paulette Blanch   Total critical care time: 45 minutes  Critical care time was exclusive of separately billable procedures and treating other patients.  Critical care was necessary to treat or prevent imminent or life-threatening deterioration.  Critical care was time spent personally by me on the following activities: development of treatment plan with patient and/or surrogate as well as nursing, discussions with consultants, evaluation of patient's response  to treatment, examination of patient, obtaining history from patient or surrogate, ordering and performing treatments and interventions, ordering and review of laboratory studies, ordering and review of radiographic studies, pulse oximetry and re-evaluation of patient's condition.   ____________________________________________   INITIAL IMPRESSION / ASSESSMENT AND PLAN / ED COURSE  As part of my medical decision making, I reviewed the following data within the [place] History obtained from family, Nursing notes reviewed and incorporated, Labs reviewed, EKG interpreted, Old chart reviewed, Radiograph reviewed, Discussed with admitting physician and Notes from prior ED visits     Madison [name] was evaluated in Emergency Department on 04/06/2020 for the symptoms described in the history of present illness. She was evaluated in the context of the global COVID-19 pandemic, which necessitated consideration that the patient might be at risk for infection with the SARS-CoV-2 virus that causes COVID-19. Institutional protocols and algorithms that pertain to the evaluation of patients at risk for COVID-19 are in a state of rapid change based on information released by regulatory bodies including the CDC and federal and state organizations. These policies and algorithms were followed during the patient's care in the ED.    69 year old female found confused outside in the heat and the neighbors yard. Differential diagnosis includes, but is not limited to, alcohol, illicit or prescription medications, or other toxic ingestion; intracranial pathology such as stroke or intracerebral hemorrhage; fever or infectious causes including sepsis; hypoxemia and/or hypercarbia; uremia; trauma; endocrine related disorders such as diabetes, hypoglycemia, and thyroid-related diseases; hypertensive encephalopathy; etc.  Basic lab work unremarkable other than chronic kidney failure.  Will check blood  cultures, lactic acid, Depakote level, chest x-ray and CT head.  Anticipate hospitalization.   Clinical Course as of Apr 06 230  Wed Apr 06, 2020  0044 Delay due to patient was difficult IV access and required IV team to obtain rest of blood work and IV.  Noted elevated CK and troponin.  Although patient has ESRD, troponin is higher than I would expect with her chronic kidney disease.  Patient going to CT scan.  Will review CT head results prior to ordering heparin.   [JS]  0138 CT negative  for intracranial hemorrhage.  Will start heparin.  Lactic acid noted.  Will cover with broad-spectrum IV antibiotics.   [JS]    Clinical Course User Index [JS] Paulette Blanch, MD     ____________________________________________   FINAL CLINICAL IMPRESSION(S) / ED DIAGNOSES  Final diagnoses:  Altered mental status, unspecified altered mental status type  Non-traumatic rhabdomyolysis  NSTEMI (non-ST elevated myocardial infarction) ([place])  Sepsis, due to unspecified organism, unspecified whether acute organ dysfunction present [facility])     ED Discharge Orders    None       Note:  This document was prepared using Dragon voice recognition software and may include unintentional dictation errors.   Paulette Blanch, MD 04/06/20 [phone]

## 2020-04-05 NOTE — ED Notes (Signed)
Pt brought in via ems from home.  Pt was found on the sidewalk for unknown amount of time.  Pt alert,   Pt lives with her sister.  Family at bedside with md.  Pt talking and following commands.

## 2020-04-06 ENCOUNTER — Other Ambulatory Visit: Payer: Self-pay

## 2020-04-06 ENCOUNTER — Emergency Department: Payer: Medicare Other

## 2020-04-06 ENCOUNTER — Observation Stay (HOSPITAL_COMMUNITY)
Admit: 2020-04-06 | Discharge: 2020-04-06 | Disposition: A | Discharge status: Home / Self Care | Payer: Medicare Other | Attending: Internal Medicine | Admitting: Internal Medicine

## 2020-04-06 DIAGNOSIS — M6282 Rhabdomyolysis: Secondary | ICD-10-CM | POA: Diagnosis present

## 2020-04-06 DIAGNOSIS — E875 Hyperkalemia: Secondary | ICD-10-CM | POA: Diagnosis present

## 2020-04-06 DIAGNOSIS — I214 Non-ST elevation (NSTEMI) myocardial infarction: Secondary | ICD-10-CM

## 2020-04-06 DIAGNOSIS — N186 End stage renal disease: Secondary | ICD-10-CM | POA: Diagnosis present

## 2020-04-06 DIAGNOSIS — A419 Sepsis, unspecified organism: Secondary | ICD-10-CM

## 2020-04-06 DIAGNOSIS — I120 Hypertensive chronic kidney disease with stage 5 chronic kidney disease or end stage renal disease: Secondary | ICD-10-CM | POA: Diagnosis present

## 2020-04-06 DIAGNOSIS — Z992 Dependence on renal dialysis: Secondary | ICD-10-CM | POA: Diagnosis not present

## 2020-04-06 DIAGNOSIS — E872 Acidosis, unspecified: Secondary | ICD-10-CM

## 2020-04-06 DIAGNOSIS — N2581 Secondary hyperparathyroidism of renal origin: Secondary | ICD-10-CM | POA: Diagnosis present

## 2020-04-06 DIAGNOSIS — I340 Nonrheumatic mitral (valve) insufficiency: Secondary | ICD-10-CM

## 2020-04-06 DIAGNOSIS — G629 Polyneuropathy, unspecified: Secondary | ICD-10-CM | POA: Diagnosis present

## 2020-04-06 DIAGNOSIS — I248 Other forms of acute ischemic heart disease: Secondary | ICD-10-CM | POA: Diagnosis present

## 2020-04-06 DIAGNOSIS — Z79899 Other long term (current) drug therapy: Secondary | ICD-10-CM | POA: Diagnosis not present

## 2020-04-06 DIAGNOSIS — Z841 Family history of disorders of kidney and ureter: Secondary | ICD-10-CM | POA: Diagnosis not present

## 2020-04-06 DIAGNOSIS — Z833 Family history of diabetes mellitus: Secondary | ICD-10-CM | POA: Diagnosis not present

## 2020-04-06 DIAGNOSIS — F411 Generalized anxiety disorder: Secondary | ICD-10-CM | POA: Diagnosis present

## 2020-04-06 DIAGNOSIS — R778 Other specified abnormalities of plasma proteins: Secondary | ICD-10-CM | POA: Diagnosis not present

## 2020-04-06 DIAGNOSIS — Z820 Family history of epilepsy and other diseases of the nervous system: Secondary | ICD-10-CM | POA: Diagnosis not present

## 2020-04-06 DIAGNOSIS — J189 Pneumonia, unspecified organism: Secondary | ICD-10-CM | POA: Diagnosis present

## 2020-04-06 DIAGNOSIS — D631 Anemia in chronic kidney disease: Secondary | ICD-10-CM | POA: Diagnosis present

## 2020-04-06 DIAGNOSIS — I160 Hypertensive urgency: Secondary | ICD-10-CM | POA: Diagnosis present

## 2020-04-06 DIAGNOSIS — Z20822 Contact with and (suspected) exposure to covid-19: Secondary | ICD-10-CM | POA: Diagnosis present

## 2020-04-06 DIAGNOSIS — K219 Gastro-esophageal reflux disease without esophagitis: Secondary | ICD-10-CM | POA: Diagnosis present

## 2020-04-06 DIAGNOSIS — F332 Major depressive disorder, recurrent severe without psychotic features: Secondary | ICD-10-CM | POA: Diagnosis present

## 2020-04-06 DIAGNOSIS — F250 Schizoaffective disorder, bipolar type: Secondary | ICD-10-CM | POA: Diagnosis present

## 2020-04-06 DIAGNOSIS — E871 Hypo-osmolality and hyponatremia: Secondary | ICD-10-CM | POA: Diagnosis present

## 2020-04-06 DIAGNOSIS — G9341 Metabolic encephalopathy: Secondary | ICD-10-CM

## 2020-04-06 DIAGNOSIS — T50904A Poisoning by unspecified drugs, medicaments and biological substances, undetermined, initial encounter: Secondary | ICD-10-CM

## 2020-04-06 LAB — CBC
HCT: 35.9 % — ABNORMAL LOW (ref 36.0–46.0)
Hemoglobin: 12 g/dL (ref 12.0–15.0)
MCH: 32.3 pg (ref 26.0–34.0)
MCHC: 33.4 g/dL (ref 30.0–36.0)
MCV: 96.8 fL (ref 80.0–100.0)
Platelets: 200 10*3/uL (ref 150–400)
RBC: 3.71 MIL/uL — ABNORMAL LOW (ref 3.87–5.11)
RDW: 16.6 % — ABNORMAL HIGH (ref 11.5–15.5)
WBC: 5.8 10*3/uL (ref 4.0–10.5)
nRBC: 0 % (ref 0.0–0.2)

## 2020-04-06 LAB — PROTIME-INR
INR: 1 (ref 0.8–1.2)
Prothrombin Time: 12.7 seconds (ref 11.4–15.2)

## 2020-04-06 LAB — CK: Total CK: 1711 U/L — ABNORMAL HIGH (ref 38–234)

## 2020-04-06 LAB — ECHOCARDIOGRAM COMPLETE: Weight: 2528 oz

## 2020-04-06 LAB — SARS CORONAVIRUS 2 BY RT PCR (HOSPITAL ORDER, PERFORMED IN ~~LOC~~ HOSPITAL LAB): SARS Coronavirus 2: NEGATIVE

## 2020-04-06 LAB — LACTIC ACID, PLASMA
Lactic Acid, Venous: 2.8 mmol/L (ref 0.5–1.9)
Lactic Acid, Venous: 2.9 mmol/L (ref 0.5–1.9)

## 2020-04-06 LAB — HEPARIN LEVEL (UNFRACTIONATED): Heparin Unfractionated: 0.51 IU/mL (ref 0.30–0.70)

## 2020-04-06 LAB — APTT: aPTT: 28 seconds (ref 24–36)

## 2020-04-06 LAB — VALPROIC ACID LEVEL: Valproic Acid Lvl: 86 ug/mL (ref 50.0–100.0)

## 2020-04-06 LAB — TROPONIN I (HIGH SENSITIVITY)
Troponin I (High Sensitivity): 158 ng/L (ref <18)
Troponin I (High Sensitivity): 173 ng/L (ref <18)

## 2020-04-06 LAB — HIV ANTIBODY (ROUTINE TESTING W REFLEX): HIV Screen 4th Generation wRfx: NONREACTIVE

## 2020-04-06 LAB — PHOSPHORUS: Phosphorus: 2.6 mg/dL (ref 2.5–4.6)

## 2020-04-06 MED ORDER — CHLORHEXIDINE GLUCONATE CLOTH 2 % EX PADS
6.0000 | MEDICATED_PAD | Freq: Every day | CUTANEOUS | Status: DC
  Administered 2020-04-07 – 2020-04-08 (×2): 6 via TOPICAL
  Filled 2020-04-06 (×2): qty 6

## 2020-04-06 MED ORDER — DIVALPROEX SODIUM 250 MG PO DR TAB: 250.0000 mg | DELAYED_RELEASE_TABLET | Freq: Two times a day (BID) | ORAL | Status: DC

## 2020-04-06 MED ORDER — BISACODYL 10 MG RE SUPP: 10.0000 mg | Freq: Every day | RECTAL | Status: DC | PRN

## 2020-04-06 MED ORDER — HEPARIN BOLUS VIA INFUSION
3600.0000 [IU] | Freq: Once | INTRAVENOUS | Status: AC
  Administered 2020-04-06: 3600 [IU] via INTRAVENOUS
  Filled 2020-04-06: qty 3600

## 2020-04-06 MED ORDER — LIDOCAINE-PRILOCAINE 2.5-2.5 % EX CREA
1.0000 "application " | TOPICAL_CREAM | CUTANEOUS | Status: DC | PRN
  Filled 2020-04-06: qty 5

## 2020-04-06 MED ORDER — ASPIRIN 300 MG RE SUPP: 300.0000 mg | RECTAL | Status: AC

## 2020-04-06 MED ORDER — METRONIDAZOLE IN NACL 5-0.79 MG/ML-% IV SOLN
500.0000 mg | Freq: Once | INTRAVENOUS | Status: DC
  Filled 2020-04-06: qty 100

## 2020-04-06 MED ORDER — PENTAFLUOROPROP-TETRAFLUOROETH EX AERO
1.0000 "application " | INHALATION_SPRAY | CUTANEOUS | Status: DC | PRN
  Filled 2020-04-06: qty 30

## 2020-04-06 MED ORDER — NITROGLYCERIN 0.4 MG SL SUBL: 0.4000 mg | SUBLINGUAL_TABLET | SUBLINGUAL | Status: DC | PRN

## 2020-04-06 MED ORDER — DOCUSATE SODIUM 100 MG PO CAPS
100.0000 mg | ORAL_CAPSULE | Freq: Two times a day (BID) | ORAL | Status: DC
  Administered 2020-04-06 – 2020-04-18 (×24): 100 mg via ORAL
  Filled 2020-04-06 (×24): qty 1

## 2020-04-06 MED ORDER — METRONIDAZOLE IN NACL 5-0.79 MG/ML-% IV SOLN
500.0000 mg | Freq: Three times a day (TID) | INTRAVENOUS | Status: DC
  Administered 2020-04-06 (×2): 500 mg via INTRAVENOUS
  Filled 2020-04-06 (×5): qty 100

## 2020-04-06 MED ORDER — ONDANSETRON HCL 4 MG/2ML IJ SOLN: 4.0000 mg | Freq: Four times a day (QID) | INTRAMUSCULAR | Status: DC | PRN

## 2020-04-06 MED ORDER — SODIUM CHLORIDE 0.9 % IV BOLUS (SEPSIS): 1000.0000 mL | Freq: Once | INTRAVENOUS | Status: DC

## 2020-04-06 MED ORDER — VANCOMYCIN HCL IN DEXTROSE 1-5 GM/200ML-% IV SOLN: 1000.0000 mg | Freq: Once | INTRAVENOUS | Status: DC

## 2020-04-06 MED ORDER — METRONIDAZOLE IN NACL 5-0.79 MG/ML-% IV SOLN
500.0000 mg | Freq: Three times a day (TID) | INTRAVENOUS | Status: DC
  Administered 2020-04-06: 500 mg via INTRAVENOUS

## 2020-04-06 MED ORDER — ALTEPLASE 2 MG IJ SOLR
2.0000 mg | Freq: Once | INTRAMUSCULAR | Status: DC | PRN
  Filled 2020-04-06: qty 2

## 2020-04-06 MED ORDER — HEPARIN SODIUM (PORCINE) 1000 UNIT/ML DIALYSIS
1000.0000 [IU] | INTRAMUSCULAR | Status: DC | PRN
  Filled 2020-04-06: qty 1

## 2020-04-06 MED ORDER — ASPIRIN EC 81 MG PO TBEC
81.0000 mg | DELAYED_RELEASE_TABLET | Freq: Every day | ORAL | Status: DC
  Administered 2020-04-07 – 2020-04-18 (×12): 81 mg via ORAL
  Filled 2020-04-06 (×12): qty 1

## 2020-04-06 MED ORDER — ASPIRIN 81 MG PO CHEW
324.0000 mg | CHEWABLE_TABLET | ORAL | Status: AC
  Administered 2020-04-06: 324 mg via ORAL
  Filled 2020-04-06: qty 4

## 2020-04-06 MED ORDER — DIVALPROEX SODIUM 250 MG PO DR TAB
250.0000 mg | DELAYED_RELEASE_TABLET | Freq: Two times a day (BID) | ORAL | Status: DC
  Administered 2020-04-06 – 2020-04-18 (×24): 250 mg via ORAL
  Filled 2020-04-06 (×27): qty 1

## 2020-04-06 MED ORDER — METOPROLOL TARTRATE 25 MG PO TABS: 12.5000 mg | ORAL_TABLET | Freq: Two times a day (BID) | ORAL | Status: DC

## 2020-04-06 MED ORDER — SODIUM CHLORIDE 0.9 % IV BOLUS (SEPSIS)
250.0000 mL | Freq: Once | INTRAVENOUS | Status: AC
  Administered 2020-04-06: 250 mL via INTRAVENOUS

## 2020-04-06 MED ORDER — ACETAMINOPHEN 325 MG PO TABS: 650.0000 mg | ORAL_TABLET | ORAL | Status: DC | PRN

## 2020-04-06 MED ORDER — SODIUM CHLORIDE 0.9 % IV BOLUS (SEPSIS)
1000.0000 mL | Freq: Once | INTRAVENOUS | Status: AC
  Administered 2020-04-06: 1000 mL via INTRAVENOUS

## 2020-04-06 MED ORDER — VANCOMYCIN HCL IN DEXTROSE 750-5 MG/150ML-% IV SOLN
750.0000 mg | INTRAVENOUS | Status: DC | PRN
  Filled 2020-04-06 (×2): qty 150

## 2020-04-06 MED ORDER — SODIUM CHLORIDE 0.9 % IV SOLN: 100.0000 mL | INTRAVENOUS | Status: DC | PRN

## 2020-04-06 MED ORDER — LIDOCAINE HCL (PF) 1 % IJ SOLN
5.0000 mL | INTRAMUSCULAR | Status: DC | PRN
  Filled 2020-04-06: qty 5

## 2020-04-06 MED ORDER — ATORVASTATIN CALCIUM 20 MG PO TABS
20.0000 mg | ORAL_TABLET | Freq: Every day | ORAL | Status: DC
  Administered 2020-04-06: 20 mg via ORAL
  Filled 2020-04-06: qty 1

## 2020-04-06 MED ORDER — METOPROLOL TARTRATE 25 MG PO TABS
12.5000 mg | ORAL_TABLET | Freq: Two times a day (BID) | ORAL | Status: DC
  Administered 2020-04-06 – 2020-04-17 (×20): 12.5 mg via ORAL
  Filled 2020-04-06 (×24): qty 1

## 2020-04-06 MED ORDER — SODIUM CHLORIDE 0.9 % IV BOLUS
500.0000 mL | Freq: Once | INTRAVENOUS | Status: AC
  Administered 2020-04-06: 500 mL via INTRAVENOUS

## 2020-04-06 MED ORDER — SODIUM CHLORIDE 0.9 % IV SOLN
1.0000 g | INTRAVENOUS | Status: DC
  Administered 2020-04-07: 1 g via INTRAVENOUS
  Filled 2020-04-06 (×2): qty 1

## 2020-04-06 MED ORDER — HEPARIN (PORCINE) 25000 UT/250ML-% IV SOLN
800.0000 [IU]/h | INTRAVENOUS | Status: DC
  Administered 2020-04-06 – 2020-04-07 (×2): 800 [IU]/h via INTRAVENOUS
  Filled 2020-04-06 (×2): qty 250

## 2020-04-06 MED ORDER — VANCOMYCIN HCL 500 MG/100ML IV SOLN
500.0000 mg | Freq: Once | INTRAVENOUS | Status: AC
  Administered 2020-04-06: 500 mg via INTRAVENOUS
  Filled 2020-04-06: qty 100

## 2020-04-06 MED ORDER — HEPARIN (PORCINE) 25000 UT/250ML-% IV SOLN: 14.0000 [IU]/kg/h | INTRAVENOUS | Status: DC

## 2020-04-06 MED ORDER — SODIUM CHLORIDE 0.9 % IV SOLN
2.0000 g | Freq: Once | INTRAVENOUS | Status: AC
  Administered 2020-04-06: 2 g via INTRAVENOUS
  Filled 2020-04-06: qty 2

## 2020-04-06 MED ORDER — CHLORHEXIDINE GLUCONATE CLOTH 2 % EX PADS
6.0000 | MEDICATED_PAD | Freq: Every day | CUTANEOUS | Status: DC
  Administered 2020-04-07 – 2020-04-18 (×12): 6 via TOPICAL
  Filled 2020-04-06 (×2): qty 6

## 2020-04-06 MED ORDER — VANCOMYCIN HCL IN DEXTROSE 1-5 GM/200ML-% IV SOLN
1000.0000 mg | Freq: Once | INTRAVENOUS | Status: AC
  Administered 2020-04-06: 1000 mg via INTRAVENOUS
  Filled 2020-04-06: qty 200

## 2020-04-06 MED ORDER — HEPARIN SODIUM (PORCINE) 5000 UNIT/ML IJ SOLN
4000.0000 [IU] | Freq: Once | INTRAMUSCULAR | Status: DC
  Filled 2020-04-06: qty 1

## 2020-04-06 NOTE — Progress Notes (Signed)
*  PRELIMINARY RESULTS* Echocardiogram 2D Echocardiogram has been performed.  Sherrie Sport 04/06/2020, 8:47 AM

## 2020-04-06 NOTE — Progress Notes (Addendum)
Patient admitted to floor from ed to room 245.  Tele sitter in place for safety.  Patient able to tell this nurse the year and that she is in the hospital but when asked any other questions, she appears confused and talks about other things such as where her belongings are and people keeping things form her. Patient rambles and is confused regarding what is going on and what brought her in here.  At this time, unable to complete her admission. Patient with tele sitter in place and low bed ordered.

## 2020-04-06 NOTE — ED Notes (Signed)
Report called to kierra rn   Pt moved to [facility] room 32.

## 2020-04-06 NOTE — Consult Note (Signed)
Pharmacy Antibiotic Note  Madison [name] is a 69 y.o. female admitted on 04/05/2020 with possible Sepsis.  Pharmacy has been consulted for cefepime and vancomycin dosing. Patient has PMH of ESRD on HD.   Patient received vancomycin 1500mg  IV x 1 dose in ED   Plan: Will plan to give vancomycin 750mg  IV at the end of HD on HD days.   Start Cefepime 1g IV every 24 hours   Weight: 71.7 kg (158 lb)  Temp (24hrs), Avg:98.2 F (36.8 C), Min:97.9 F (36.6 C), Max:98.4 F (36.9 C)  Recent Labs  Lab 04/05/20 1911 04/05/20 1938 04/06/20 0027 04/06/20 0227  WBC 10.7*  --   --   --   CREATININE  --  7.37*  --   --   LATICACIDVEN  --   --  2.8* 2.9*    Estimated Creatinine Clearance: 6.7 mL/min (A) (by C-G formula based on SCr of 7.37 mg/dL (H)).    No Known Allergies  Antimicrobials this admission: 7/14 cefepime >>  7/14 vancomycin  >>  7/14 Flagyl>>   Microbiology results: 7/14  BCx: pending  Thank you for allowing pharmacy to be a part of this patient's care.  Pernell Dupre, PharmD, BCPS Clinical Pharmacist 04/06/2020 8:42 AM

## 2020-04-06 NOTE — ED Notes (Signed)
Lab in with pt now °

## 2020-04-06 NOTE — Progress Notes (Signed)
CODE SEPSIS - PHARMACY COMMUNICATION  **Broad Spectrum Antibiotics should be administered within 1 hour of Sepsis diagnosis**  Time Code Sepsis Called/Page Received: 0143  Antibiotics Ordered: vanc/cefepime/flagyl  Time of 1st antibiotic administration: 0158  Additional action taken by pharmacy:   If necessary, Name of Provider/Nurse Contacted:     Tobie Lords ,PharmD Clinical Pharmacist  04/06/2020  2:24 AM

## 2020-04-06 NOTE — Progress Notes (Signed)
Hemodialysis patient known at [facility] MWF 6:30, patient normally transports with CAT transportation. Please contact me with any dialysis placement concerns.  Elvera Bicker Dialysis Coordinator [phone]

## 2020-04-06 NOTE — Plan of Care (Signed)
  Problem: Elimination: Goal: Will not experience complications related to bowel motility Outcome: Progressing Goal: Will not experience complications related to urinary retention Outcome: Progressing   Problem: Safety: Goal: Ability to remain free from injury will improve Outcome: Progressing   

## 2020-04-06 NOTE — H&P (Signed)
History and Physical    Madison [name] TKZ:[id number] DOB: 04-09-1951 DOA: 04/05/2020  PCP: Vidal Schwalbe, MD   Patient coming from: home  I have personally briefly reviewed patient's old medical records in [place]  Chief Complaint: Unresponsive  HPI: Madison [name] is a 69 y.o. female with medical history significant for schizophrenia and bipolar mood disorder, ESRD on HD MWF last dialysis 04/04/2020, with no missed dialysis sessions who was brought in after she was found lying on the ground at the neighbors house for an unknown period of time.  Most of the history is given by the sister who states that she was sleeping and that her sister would not have been outside for more than a few hours.  States that patient is compliant with dialysis.  Usually she administers the patient her medication but for the past few days she has been taking it herself and she found empty blister packs.  It is unknown whether patient was throwing away the medication or taking more than she was supposed to.  Also noticed that of late, sister appeared more confused but she had no recent illnesses.  She has had no fever chills cough, and vomiting or change in bowel habits and has not complained of headache or weakness.  ED Course: By arrival in the emergency room patient was awake, though confused.  Vitals within normal limits.  EKG with normal sinus rhythm and no acute ST-T wave changes.  Pertinent labs include WBC 10700, CK 1711, 82.8, troponin 158.  Depakote level normal.  CXR showed mid and lower lung zones atelectasis versus pneumonia including atypical viral infection.  Covid test negative.  Patient started on 1.sepsis protocol for possible pneumonia but overall unknown source  2.  Heparin infusion for possible ACS given troponin 158   Review of Systems: Unable to obtain due to confusion  Past Medical History:  Diagnosis Date  . Anxiety   . ESRD (end stage renal disease) on dialysis ([place])   .  Neuropathy   . Schizophrenia [facility])     Past Surgical History:  Procedure Laterality Date  . CHOLECYSTECTOMY    . COLONOSCOPY N/A 01/20/2016   Dr. Oneida Alar: 12 mm tubular adenoma removed from the transverse colon, 4 hyperplastic polyps removed from the rectum and sigmoid colon.  Next colonoscopy planned for April 2020.  Marland Kitchen ESOPHAGOGASTRODUODENOSCOPY N/A 12/19/2017   Procedure: ESOPHAGOGASTRODUODENOSCOPY (EGD);  Surgeon: [name], MD;  [facility];  Service: Endoscopy;  Laterality: N/A;  8:30am  . fistula left arm    . GIVENS CAPSULE STUDY N/A 01/14/2018   Procedure: GIVENS CAPSULE STUDY;  Surgeon: [name], MD;  Location: [facility];  Service: Endoscopy;  Laterality: N/A;  7:30am  . HEMICOLECTOMY Right   . TUBAL LIGATION       reports that she has never smoked. She has never used smokeless tobacco. She reports that she does not drink alcohol and does not use drugs.  No Known Allergies  Family History  Problem Relation Age of Onset  . Alzheimer's disease Mother   . Cancer Mother        pancreatic  . Diabetes Mother   . Prostate cancer Father   . Kidney failure Sister   . Breast cancer Neg Hx   . Colon cancer Neg Hx       Prior to Admission medications   Medication Sig Start Date End Date Taking? Authorizing Provider  B Complex-C-Folic Acid (DIALYVITE TABLET) TABS Take  1 tablet by mouth daily. 03/31/20  Yes [provider]  divalproex (DEPAKOTE) 250 MG DR tablet Take 1 tablet (250 mg total) by mouth 3 (three) times daily. Patient taking differently: Take 250 mg by mouth 2 (two) times daily.  01/09/19  Yes Clapacs, Madie Reno, MD  epoetin alfa (EPOGEN,PROCRIT) 56256 UNIT/ML injection Inject 1 mL (10,000 Units total) into the vein every Monday, Wednesday, and Friday with hemodialysis. 01/09/19  Yes Clapacs, Madie Reno, MD  ferric citrate (AURYXIA) 1 GM 210 MG(Fe) tablet Take 210 mg by mouth 3 (three) times daily with meals.    Yes [provider]    gabapentin (NEURONTIN) 100 MG capsule Take 1 capsule (100 mg total) by mouth 2 (two) times daily. 01/08/19  Yes Clapacs, Madie Reno, MD  OLANZapine (ZYPREXA) 15 MG tablet Take 15 mg by mouth at bedtime. 03/31/20  Yes [provider]  pantoprazole (PROTONIX) 40 MG tablet TAKE 1 TABLET BY MOUTH ONCE DAILY. 05/26/19  Yes Annitta Needs, NP  sodium zirconium cyclosilicate (LOKELMA) 10 g PACK packet Take 10 g by mouth daily. 01/09/19  Yes Clapacs, Madie Reno, MD  thiamine (VITAMIN B-1) 100 MG tablet Take 0.5 tablets (50 mg total) by mouth daily. 01/08/19  Yes Clapacs, Madie Reno, MD  senna (SENOKOT) 8.6 MG TABS tablet Take 1-2 tablets (8.6-17.2 mg total) by mouth at bedtime as needed for mild constipation or moderate constipation. Patient not taking: Reported on 04/06/2020 01/08/19   Gonzella Lex, MD    Physical Exam: Vitals:   04/06/20 0136 04/06/20 0137 04/06/20 0138 04/06/20 0139  BP:      Pulse: 65 61 65 65  Resp: 18 15 17 16   Temp:      TempSrc:      SpO2: 98% 97% 98% 97%  Weight:         Vitals:   04/06/20 0136 04/06/20 0137 04/06/20 0138 04/06/20 0139  BP:      Pulse: 65 61 65 65  Resp: 18 15 17 16   Temp:      TempSrc:      SpO2: 98% 97% 98% 97%  Weight:          Constitutional:  Somewhat somnolent, lethargic.  Confused.  Conversing but not making sense HEENT:      Head: Normocephalic and atraumatic.         Eyes: PERLA, EOMI, Conjunctivae are normal. Sclera is non-icteric.       Mouth/Throat: Mucous membranes are moist.       Neck: Supple with no signs of meningismus. Cardiovascular: Regular rate and rhythm. No murmurs, gallops, or rubs. 2+ symmetrical distal pulses are present . No JVD. No LE edema Respiratory: Respiratory effort normal .Lungs sounds clear bilaterally. No wheezes, crackles, or rhonchi.  Gastrointestinal: Soft, non tender, and non distended with positive bowel sounds. No rebound or guarding. Genitourinary: No CVA tenderness. Musculoskeletal: Nontender with  normal range of motion in all extremities. No cyanosis, or erythema of extremities. Neurologic: Normal speech and language. Face is symmetric. Moving all extremities. No gross focal neurologic deficits . Skin: Skin is warm, dry.  No rash or ulcers Psychiatric: Mood and affect are normal Speech and behavior are normal   Labs on Admission: I have personally reviewed following labs and imaging studies  CBC: Recent Labs  Lab 04/05/20 1911  WBC 10.7*  HGB 12.8  HCT 37.9  MCV 95.9  PLT 389   Basic Metabolic Panel: Recent Labs  Lab 04/05/20 1938  NA  141  K 4.2  CL 94*  CO2 29  GLUCOSE 90  BUN 43*  CREATININE 7.37*  CALCIUM 9.7   GFR: Estimated Creatinine Clearance: 6.7 mL/min (A) (by C-G formula based on SCr of 7.37 mg/dL (H)). Liver Function Tests: Recent Labs  Lab 04/05/20 1938  AST 69*  ALT 27  ALKPHOS 88  BILITOT 0.8  PROT 8.3*  ALBUMIN 4.1   No results for input(s): LIPASE, AMYLASE in the last 168 hours. No results for input(s): AMMONIA in the last 168 hours. Coagulation Profile: No results for input(s): INR, PROTIME in the last 168 hours. Cardiac Enzymes: Recent Labs  Lab 04/05/20 1938  CKTOTAL 1,711*   BNP (last 3 results) No results for input(s): PROBNP in the last 8760 hours. HbA1C: No results for input(s): HGBA1C in the last 72 hours. CBG: No results for input(s): GLUCAP in the last 168 hours. Lipid Profile: No results for input(s): CHOL, HDL, LDLCALC, TRIG, CHOLHDL, LDLDIRECT in the last 72 hours. Thyroid Function Tests: No results for input(s): TSH, T4TOTAL, FREET4, T3FREE, THYROIDAB in the last 72 hours. Anemia Panel: No results for input(s): VITAMINB12, FOLATE, FERRITIN, TIBC, IRON, RETICCTPCT in the last 72 hours. Urine analysis:    Component Value Date/Time   COLORURINE YELLOW (A) 08/15/2017 1155   APPEARANCEUR HAZY (A) 08/15/2017 1155   LABSPEC 1.007 08/15/2017 1155   PHURINE 8.0 08/15/2017 1155   GLUCOSEU NEGATIVE 08/15/2017 1155    HGBUR NEGATIVE 08/15/2017 [place] 08/15/2017 1155   KETONESUR NEGATIVE 08/15/2017 1155   PROTEINUR >=300 (A) 08/15/2017 1155   NITRITE NEGATIVE 08/15/2017 1155   LEUKOCYTESUR SMALL (A) 08/15/2017 1155    Radiological Exams on Admission: DG Chest 1 View  Result Date: 04/05/2020 CLINICAL DATA:  Confusion. EXAM: CHEST  1 VIEW COMPARISON:  01/05/2019 FINDINGS: Low lung volumes. Upper normal heart size with mild aortic tortuosity. Vague heterogeneous opacities in both mid lower lung zones. No pneumothorax or significant pleural effusion. Vascular stent in the left upper arm. Bones are under mineralized. IMPRESSION: Low lung volumes with vague heterogeneous opacities in both mid and lower lung zones, atelectasis versus pneumonia, including atypical viral infection. Electronically Signed   By: Keith Rake M.D.   On: 04/05/2020 23:28   CT Head Wo Contrast  Result Date: 04/06/2020 CLINICAL DATA:  69 year old female with altered mental status. EXAM: CT HEAD WITHOUT CONTRAST TECHNIQUE: Contiguous axial images were obtained from the base of the skull through the vertex without intravenous contrast. COMPARISON:  Head CT dated 01/20/2019. FINDINGS: Brain: Mild age-related atrophy and chronic microvascular ischemic changes. A 2.0 x 1.8 cm right anterior parafalcine partially calcified meningioma similar to prior CT. No surrounding edema. There is no acute intracranial hemorrhage. No mass effect or midline shift. No extra-axial fluid collection. Vascular: No hyperdense vessel or unexpected calcification. Skull: Normal. Negative for fracture or focal lesion. Sinuses/Orbits: No acute finding. Other: None IMPRESSION: 1. No acute intracranial pathology. 2. Mild age-related atrophy and chronic microvascular ischemic changes. 3. Stable right anterior parafalcine meningioma. No associated edema. Electronically Signed   By: Anner Crete M.D.   On: 04/06/2020 01:29    EKG: Independently  reviewed. Interpretation : Normal sinus rhythm with no acute ST-T wave changes  Assessment/Plan 69 year old female with a history of ESRD on HD MWF with no missed dialysis, and schizophrenia with bipolar mood disorder admitted after being found unresponsive.  Troponin 158, lactic acid 2.8, CXR with possible pneumonia.  Empty pill packets formed.    Acute metabolic  encephalopathy -Etiology uncertain.  Differential given abnormal findings include overdose, sepsis, lower suspicion for ACS -Patient more awake and continues to be confused -Treat each possible etiology as outlined below -Neurologic checks with fall and aspiration precautions  Possible sepsis Pneumonia + lactic acidosis -Patient with pneumonia and elevated lactic acid with mild leukocytosis but no fever or tachycardia -Empiric antibiotics, IV fluids -Follow blood cultures     Suspected Overdose, undetermined intent, initial encounter -One-to-one sitter -Psych consult when improved    Rhabdomyolysis, acute -CK elevated at 5053, uncertain whether patient fell -IV hydration but monitoring for overload in view of dialysis status    Elevated troponin, possible NSTEMI -No complaints of chest pain and no acute EKG changes but troponin elevated at 158 though could be related to ESRD on HD -We will continue heparin infusion started in the emergency room -Aspirin and beta blockers and statins -Consider cardiology consult    ESRD on hemodialysis ([place]) -Last dialysis 04/04/2020 -Consult renal for continuation of dialysis    Schizoaffective disorder, bipolar type ([place]) -Depakote level within normal limits -Psych consult in the a.m.    DVT prophylaxis: On full dose heparin Code Status: full code  Family Communication: Sister at bedside Disposition Plan: Back to previous home environment Consults called: Renal Status: Observation    Athena Masse MD [facility]     04/06/2020, 2:20 AM

## 2020-04-06 NOTE — Consult Note (Signed)
CARDIOLOGY CONSULT NOTE               Patient ID: Madison [name] MRN: [id number] DOB/AGE: 1950/11/11 69 y.o.  Admit date: 04/05/2020 Referring Physician Dr Ethelene Hal hospitalist Primary Physician unknown Primary Cardiologist none Reason for Consultation altered mental status abnormal EKG borderline troponins  HPI: Patient has an extensive history of schizophrenia bipolar presents with acute encephalopathy with confusion altered mental status possible overdose possible sepsis patient has abnormal EKG compared to previous EKG is not significantly different but she presented with confusion of unclear etiology suspected pneumonia with sepsis placed on IV antibiotic therapy patient also has elevated CPK suggestive of possible rhabdo she is a end-stage renal disease dialysis patient and also was referred for cardiac evaluation with abnormal EKG and borderline troponins patient denies any chest pain but she is very confused so history is not very helpful  Review of systems complete and found to be negative unless listed above     Past Medical History:  Diagnosis Date  . Anxiety   . ESRD (end stage renal disease) on dialysis ([place])   . Neuropathy   . Schizophrenia [facility])     Past Surgical History:  Procedure Laterality Date  . CHOLECYSTECTOMY    . COLONOSCOPY N/A 01/20/2016   Dr. Oneida Alar: 12 mm tubular adenoma removed from the transverse colon, 4 hyperplastic polyps removed from the rectum and sigmoid colon.  Next colonoscopy planned for April 2020.  Marland Kitchen ESOPHAGOGASTRODUODENOSCOPY N/A 12/19/2017   Procedure: ESOPHAGOGASTRODUODENOSCOPY (EGD);  Surgeon: [name], MD;  [facility];  Service: Endoscopy;  Laterality: N/A;  8:30am  . fistula left arm    . GIVENS CAPSULE STUDY N/A 01/14/2018   Procedure: GIVENS CAPSULE STUDY;  Surgeon: [name], MD;  [facility]: [facility];  Service: Endoscopy;  Laterality: N/A;  7:30am  . HEMICOLECTOMY Right   . TUBAL LIGATION        (Not in a hospital admission)  Social History   Socioeconomic History  . Marital status: Single    Spouse name: Not on file  . Number of children: Not on file  . Years of education: Not on file  . Highest education level: Not on file  Occupational History  . Not on file  Tobacco Use  . Smoking status: Never Smoker  . Smokeless tobacco: Never Used  Vaping Use  . Vaping Use: Never used  Substance and Sexual Activity  . Alcohol use: No  . Drug use: No  . Sexual activity: Yes  Other Topics Concern  . Not on file  Social History Narrative  . Not on file   Social Determinants of Health   Financial Resource Strain:   . Difficulty of Paying Living Expenses:   Food Insecurity:   . Worried About Charity fundraiser in the Last Year:   . Arboriculturist in the Last Year:   Transportation Needs:   . Film/video editor (Medical):   Marland Kitchen Lack of Transportation (Non-Medical):   Physical Activity:   . Days of Exercise per Week:   . Minutes of Exercise per Session:   Stress:   . Feeling of Stress :   Social Connections:   . Frequency of Communication with Friends and Family:   . Frequency of Social Gatherings with Friends and Family:   . Attends Religious Services:   . Active Member of Clubs or Organizations:   . Attends Archivist Meetings:   Marland Kitchen Marital Status:  Intimate Partner Violence:   . Fear of Current or Ex-Partner:   . Emotionally Abused:   Marland Kitchen Physically Abused:   . Sexually Abused:     Family History  Problem Relation Age of Onset  . Alzheimer's disease Mother   . Cancer Mother        pancreatic  . Diabetes Mother   . Prostate cancer Father   . Kidney failure Sister   . Breast cancer Neg Hx   . Colon cancer Neg Hx       Review of systems complete and found to be negative unless listed above      PHYSICAL EXAM  General: Well developed, well nourished, in no acute distress HEENT:  Normocephalic and atramatic Neck:  No JVD.  Lungs: Clear  bilaterally to auscultation and percussion. Heart: HRRR . Normal S1 and S2 without gallops or murmurs.  Abdomen: Bowel sounds are positive, abdomen soft and non-tender  Msk:  Back normal, normal gait. Normal strength and tone for age. Extremities: No clubbing, cyanosis or edema.   Neuro: Alert and oriented X 3. Psych:  Good affect, responds appropriately  Labs:   Lab Results  Component Value Date   WBC 10.7 (H) 04/05/2020   HGB 12.8 04/05/2020   HCT 37.9 04/05/2020   MCV 95.9 04/05/2020   PLT 198 04/05/2020    Recent Labs  Lab 04/05/20 1938  NA 141  K 4.2  CL 94*  CO2 29  BUN 43*  CREATININE 7.37*  CALCIUM 9.7  PROT 8.3*  BILITOT 0.8  ALKPHOS 88  ALT 27  AST 69*  GLUCOSE 90   Lab Results  Component Value Date   CKTOTAL 1,711 (H) 04/05/2020   TROPONINI <0.03 01/19/2019   No results found for: CHOL No results found for: HDL No results found for: LDLCALC No results found for: TRIG No results found for: CHOLHDL No results found for: LDLDIRECT    Radiology: DG Chest 1 View  Result Date: 04/05/2020 CLINICAL DATA:  Confusion. EXAM: CHEST  1 VIEW COMPARISON:  01/05/2019 FINDINGS: Low lung volumes. Upper normal heart size with mild aortic tortuosity. Vague heterogeneous opacities in both mid lower lung zones. No pneumothorax or significant pleural effusion. Vascular stent in the left upper arm. Bones are under mineralized. IMPRESSION: Low lung volumes with vague heterogeneous opacities in both mid and lower lung zones, atelectasis versus pneumonia, including atypical viral infection. Electronically Signed   By: Keith Rake M.D.   On: 04/05/2020 23:28   CT Head Wo Contrast  Result Date: 04/06/2020 CLINICAL DATA:  69 year old female with altered mental status. EXAM: CT HEAD WITHOUT CONTRAST TECHNIQUE: Contiguous axial images were obtained from the base of the skull through the vertex without intravenous contrast. COMPARISON:  Head CT dated 01/08/2019. FINDINGS: Brain:  Mild age-related atrophy and chronic microvascular ischemic changes. A 2.0 x 1.8 cm right anterior parafalcine partially calcified meningioma similar to prior CT. No surrounding edema. There is no acute intracranial hemorrhage. No mass effect or midline shift. No extra-axial fluid collection. Vascular: No hyperdense vessel or unexpected calcification. Skull: Normal. Negative for fracture or focal lesion. Sinuses/Orbits: No acute finding. Other: None IMPRESSION: 1. No acute intracranial pathology. 2. Mild age-related atrophy and chronic microvascular ischemic changes. 3. Stable right anterior parafalcine meningioma. No associated edema. Electronically Signed   By: Anner Crete M.D.   On: 04/06/2020 01:29   ECHOCARDIOGRAM COMPLETE  Result Date: 04/06/2020    ECHOCARDIOGRAM REPORT   Patient Name:   Madison [name] [facility]  Date of Exam: 04/06/2020 Medical Rec #:  [id number]       Height:       63.0 in Accession #:    [id number]      Weight:       158.0 lb Date of Birth:  Oct 09, 1950        BSA:          1.749 m Patient Age:    54 years        BP:           120/60 mmHg Patient Gender: F               HR:           75 bpm. Exam Location:  [facility] Procedure: 2D Echo, Cardiac Doppler and Color Doppler Indications:    Syncope 780.2  History:        Patient has no prior history of Echocardiogram examinations.                 ESRD.  Sonographer:    Sherrie Sport RDCS (AE) Referring Phys: [id number] Athena Masse Diagnosing      Kate Sable MD Phys: IMPRESSIONS  1. Left ventricular ejection fraction, by estimation, is 60 to 65%. The left ventricle has normal function. The left ventricle has no regional wall motion abnormalities. Left ventricular diastolic parameters were normal.  2. Right ventricular systolic function is normal. The right ventricular size is normal. There is normal pulmonary artery systolic pressure.  3. Right atrial size was mildly dilated.  4. The mitral valve is normal in structure. Mild mitral valve  regurgitation.  5. The aortic valve is normal in structure. Aortic valve regurgitation is not visualized.  6. The inferior vena cava is normal in size with greater than 50% respiratory variability, suggesting right atrial pressure of 3 mmHg. FINDINGS  Left Ventricle: Left ventricular ejection fraction, by estimation, is 60 to 65%. The left ventricle has normal function. The left ventricle has no regional wall motion abnormalities. The left ventricular internal cavity size was normal in size. There is  no left ventricular hypertrophy. Left ventricular diastolic parameters were normal. Right Ventricle: The right ventricular size is normal. No increase in right ventricular wall thickness. Right ventricular systolic function is normal. There is normal pulmonary artery systolic pressure. The tricuspid regurgitant velocity is 2.33 m/s, and  with an assumed right atrial pressure of 3 mmHg, the estimated right ventricular systolic pressure is 30.8 mmHg. Left Atrium: Left atrial size was normal in size. Right Atrium: Right atrial size was mildly dilated. Pericardium: There is no evidence of pericardial effusion. Mitral Valve: The mitral valve is normal in structure. Mild mitral valve regurgitation. Tricuspid Valve: The tricuspid valve is normal in structure. Tricuspid valve regurgitation is trivial. Aortic Valve: The aortic valve is normal in structure. Aortic valve regurgitation is not visualized. Aortic valve mean gradient measures 4.0 mmHg. Aortic valve peak gradient measures 6.7 mmHg. Aortic valve area, by VTI measures 2.72 cm. Pulmonic Valve: The pulmonic valve was not well visualized. Pulmonic valve regurgitation is not visualized. Aorta: The aortic root is normal in size and structure. Venous: The inferior vena cava is normal in size with greater than 50% respiratory variability, suggesting right atrial pressure of 3 mmHg. IAS/Shunts: No atrial level shunt detected by color flow Doppler.  LEFT VENTRICLE PLAX 2D  LVIDd:         3.53 cm  Diastology LVIDs:         2.59 cm  LV e' lateral:   7.18 cm/s LV PW:         1.51 cm  LV E/e' lateral: 12.1 LV IVS:        0.75 cm  LV e' medial:    6.74 cm/s LVOT diam:     2.00 cm  LV E/e' medial:  12.9 LV SV:         77 LV SV Index:   44 LVOT Area:     3.14 cm  RIGHT VENTRICLE RV Basal diam:  3.61 cm LEFT ATRIUM             Index       RIGHT ATRIUM           Index LA diam:        2.30 cm 1.31 cm/m  RA Area:     17.20 cm LA Vol (A2C):   44.8 ml 25.61 ml/m RA Volume:   49.40 ml  28.24 ml/m LA Vol (A4C):   36.7 ml 20.98 ml/m LA Biplane Vol: 41.7 ml 23.84 ml/m  AORTIC VALVE                   PULMONIC VALVE AV Area (Vmax):    2.87 cm    PV Vmax:        0.62 m/s AV Area (Vmean):   2.67 cm    PV Peak grad:   1.5 mmHg AV Area (VTI):     2.72 cm    RVOT Peak grad: 3 mmHg AV Vmax:           129.33 cm/s AV Vmean:          88.300 cm/s AV VTI:            0.284 m AV Peak Grad:      6.7 mmHg AV Mean Grad:      4.0 mmHg LVOT Vmax:         118.00 cm/s LVOT Vmean:        75.000 cm/s LVOT VTI:          0.246 m LVOT/AV VTI ratio: 0.87  AORTA Ao Root diam: 3.40 cm MITRAL VALVE                TRICUSPID VALVE MV Area (PHT): 2.86 cm     TR Peak grad:   21.7 mmHg MV Decel Time: 265 msec     TR Vmax:        233.00 cm/s MV E velocity: 87.00 cm/s MV A velocity: 101.00 cm/s  SHUNTS MV E/A ratio:  0.86         Systemic VTI:  0.25 m                             Systemic Diam: 2.00 cm Kate Sable MD Electronically signed by Kate Sable MD Signature Date/Time: 04/06/2020/10:35:57 AM    Final     EKG: Normal sinus rhythm with diffuse ST segment depression not significantly changed from previous studies rate of about 70  ASSESSMENT AND PLAN:  Altered mental status Confusion End-stage renal disease on dialysis Pneumonia Sepsis possible lactic acidosis Rhabdomyolysis acute Borderline troponin elevation Schizoaffective disorder bipolar type Abnormal EKG . Plan Recommend heparin for 24  hours until further troponins are drawn If troponin levels remain relatively flat will discontinue heparin as I suspect this is not an ischemic event Patient may have demand ischemia but not true acute coronary syndrome EKG  may be dynamic related to hypertensive urgency Continue therapy for encephalopathy Agree with broad-spectrum antibiotic therapy for pneumonia sepsis Continue diet dialysis management for mild rhabdo Do not recommend invasive strategy for cardiac status We will continue to follow conservatively  Signed: Yolonda Kida MD 04/06/2020, 1:54 PM

## 2020-04-06 NOTE — ED Notes (Signed)
Lab drew labs again.

## 2020-04-06 NOTE — Progress Notes (Signed)
  PROGRESS NOTE    STANLEY HELMUTH  UDJ:[id number] DOB: 03-May-1951 DOA: 04/05/2020  PCP: Vidal Schwalbe, MD    LOS - 0    Patient admitted earlier this morning after being found down in her yard with an empty pill bottle.  Was found with lactic acidosis, rhabdomyolysis and elevated troponins.  Started on broad-spectrum antibiotics per sepsis protocol.  Cardiology was consulted.  Interval subjective: Patient seen at bedside in the ED on hold for room.  She continues to be confused but does admit that she took all the pills she could find because she was " tired of it all".  Exam: Diaphoretic, no acute distress, heart sounds regular rate and rhythm, lungs are clear bilaterally, abdomen is nontender, no peripheral edema  I have reviewed the full H&P by Dr. Damita Dunnings in detail, and I agree with the assessment and plan as outlined therein. In addition: --Discontinue statin given elevated CK --Follow-up echo and cultures --Continue to trend troponins and discontinue heparin if flat, per cardiology --Continue broad-spectrum antibiotics for sepsis and presumed pneumonia for now   No Charge    Ezekiel Slocumb, DO [facility]   If 7PM-7AM, please contact night-coverage [email or web address] 04/06/2020, 9:13 AM

## 2020-04-06 NOTE — Progress Notes (Signed)
PHARMACY -  BRIEF ANTIBIOTIC NOTE   Pharmacy has received consult(s) for vanc/cefepime from an ED provider.  The patient's profile has been reviewed for ht/wt/allergies/indication/available labs.    One time order(s) placed for vancomycin 1.5g IV load and cefepime 2g IV x 1  Further antibiotics/pharmacy consults should be ordered by admitting physician if indicated.                       Thank you,  Tobie Lords, PharmD, BCPS Clinical Pharmacist 04/06/2020  1:53 AM

## 2020-04-06 NOTE — ED Notes (Signed)
Patient transported to dialysis at this time.

## 2020-04-06 NOTE — Progress Notes (Signed)
Notified bedside nurse of need to order repeat lactic acid.  

## 2020-04-06 NOTE — Progress Notes (Signed)
ANTICOAGULATION CONSULT NOTE - Initial Consult  Pharmacy Consult for heparin Indication: chest pain/ACS  No Known Allergies  Patient Measurements: Weight: 71.7 kg (158 lb) Heparin Dosing Weight: 60 kg  Vital Signs: Temp: 98.4 F (36.9 C) (07/13 1900) Temp Source: Oral (07/13 1900) BP: 129/62 (07/13 2349) Pulse Rate: 65 (07/14 0139)  Labs: Recent Labs    04/05/20 1911 04/05/20 1938  HGB 12.8  --   HCT 37.9  --   PLT 198  --   CREATININE  --  7.37*  CKTOTAL  --  1,711*  TROPONINIHS  --  158*    Estimated Creatinine Clearance: 6.7 mL/min (A) (by C-G formula based on SCr of 7.37 mg/dL (H)).   Medical History: Past Medical History:  Diagnosis Date  . Anxiety   . ESRD (end stage renal disease) on dialysis ([place])   . Neuropathy   . Schizophrenia ([place])     Medications:  Scheduled:  . heparin  3,600 Units Intravenous Once    Assessment: Patient w/ h/o ESRD on dialysis arrives via EMS after being found down on sidewalk for unknown time w/ pills missing for her pill box. trops were found to be 158, no anticoagulation PTA, baseline CBC above normal past trends. Stat aPTT/INR pending. Patient is being started on heparin drip for anticoagulation management of NSTEMI.  Goal of Therapy:  Heparin level 0.3-0.7 units/ml Monitor platelets by anticoagulation protocol: Yes   Plan:  Will bolus heparin 3600 units IV x 1. Will start rate at 800 units/hr and will check anti-Xa at 1000. Will monitor daily CBC's and adjust rate per anti-Xa levels.  Tobie Lords, PharmD, BCPS Clinical Pharmacist 04/06/2020,1:48 AM

## 2020-04-06 NOTE — Progress Notes (Signed)
[facility] for heparin Indication: chest pain/ACS  No Known Allergies  Patient Measurements: Height: 5\' 1"  (154.9 cm) Weight: 56.1 kg (123 lb 9.6 oz) IBW/kg (Calculated) : 47.8 Heparin Dosing Weight: 60 kg  Vital Signs: Temp: 98.3 F (36.8 C) (07/14 1752) Temp Source: Oral (07/14 1752) BP: 146/55 (07/14 1752) Pulse Rate: 67 (07/14 1752)  Labs: Recent Labs    04/05/20 1911 04/05/20 1938 04/06/20 0146 04/06/20 0205 04/06/20 1555  HGB 12.8  --   --   --  12.0  HCT 37.9  --   --   --  35.9*  PLT 198  --   --   --  200  APTT  --   --  28  --   --   LABPROT  --   --  12.7  --   --   INR  --   --  1.0  --   --   HEPARINUNFRC  --   --   --   --  0.51  CREATININE  --  7.37*  --   --   --   CKTOTAL  --  1,711*  --   --   --   TROPONINIHS  --  158*  --  173*  --     Estimated Creatinine Clearance: 5.4 mL/min (A) (by C-G formula based on SCr of 7.37 mg/dL (H)).   Medical History: Past Medical History:  Diagnosis Date  . Anxiety   . ESRD (end stage renal disease) on dialysis ([place])   . Neuropathy   . Schizophrenia [facility])     Assessment: Patient w/ h/o ESRD on dialysis arrives via EMS after being found down on sidewalk for unknown time w/ pills missing for her pill box. trops were found to be 158, no anticoagulation PTA, baseline CBC above normal past trends. Stat aPTT/INR pending. Patient is being started on heparin drip for anticoagulation management of NSTEMI.  Goal of Therapy:  Heparin level 0.3-0.7 units/ml Monitor platelets by anticoagulation protocol: Yes   Plan:  7/14 1555 HL 0.51 therapeutic x 1. Continue heparin drip at 800 units/hr. Recheck HL 7/15 at 0000 to confirm. CBC daily.  Dorena Bodo, PharmD Clinical Pharmacist 04/06/2020,7:31 PM

## 2020-04-06 NOTE — Progress Notes (Signed)
[facility]  ROUNDING NOTE   Subjective:  Pt underwent dialysis earlier.  Tolerated well. Appears confused.   Objective:  Vital signs in last 24 hours:  Temp:  [97.9 F (36.6 C)-98.4 F (36.9 C)] 98 F (36.7 C) (07/14 0920) Pulse Rate:  [56-96] 77 (07/14 1230) Resp:  [9-23] 16 (07/14 0920) BP: (111-146)/(60-76) 135/67 (07/14 1230) SpO2:  [91 %-100 %] 100 % (07/14 0751) Weight:  [71.7 kg] 71.7 kg (07/13 1901)  Weight change:  Filed Weights   04/05/20 1901  Weight: 71.7 kg    Intake/Output: I/O last 3 completed shifts: In: 1000 [IV Piggyback:1000] Out: -    Intake/Output this shift:  Total I/O In: 100 [IV Piggyback:100] Out: -   Physical Exam: General: No acute distress  Head: Normocephalic, atraumatic. Moist oral mucosal membranes  Eyes: Anicteric  Neck: Supple, trachea midline  Lungs:  Clear to auscultation, normal effort  Heart: S1S2 no rubs  Abdomen:  Soft, nontender, bowel sounds present  Extremities: 1+peripheral edema.  Neurologic: Awake, alert, following commands, confused  Skin: No lesions  Access: LUE AVF    Basic Metabolic Panel: Recent Labs  Lab 04/05/20 1938  NA 141  K 4.2  CL 94*  CO2 29  GLUCOSE 90  BUN 43*  CREATININE 7.37*  CALCIUM 9.7    Liver Function Tests: Recent Labs  Lab 04/05/20 1938  AST 69*  ALT 27  ALKPHOS 88  BILITOT 0.8  PROT 8.3*  ALBUMIN 4.1   No results for input(s): LIPASE, AMYLASE in the last 168 hours. No results for input(s): AMMONIA in the last 168 hours.  CBC: Recent Labs  Lab 04/05/20 1911  WBC 10.7*  HGB 12.8  HCT 37.9  MCV 95.9  PLT 198    Cardiac Enzymes: Recent Labs  Lab 04/05/20 1938  CKTOTAL 1,711*    BNP: Invalid input(s): POCBNP  CBG: No results for input(s): GLUCAP in the last 168 hours.  Microbiology: Results for orders placed or performed during the hospital encounter of 04/05/20  Culture, blood (routine x 2)     Status: None (Preliminary result)    Collection Time: 04/05/20 11:08 PM   Specimen: BLOOD RIGHT FOREARM  Result Value Ref Range Status   Specimen Description BLOOD RIGHT FOREARM  Final   Special Requests   Final    BOTTLES DRAWN AEROBIC ONLY Blood Culture adequate volume   Culture   Final    NO GROWTH < 12 HOURS Performed at [facility], [address], [place], [place] [address]    Report Status PENDING  Incomplete  SARS Coronavirus 2 by RT PCR (hospital order, performed in [place] hospital lab) Nasopharyngeal Nasopharyngeal Swab     Status: None   Collection Time: 04/05/20 11:31 PM   Specimen: Nasopharyngeal Swab  Result Value Ref Range Status   SARS Coronavirus 2 NEGATIVE NEGATIVE Final    Comment: (NOTE) SARS-CoV-2 target nucleic acids are NOT DETECTED.  The SARS-CoV-2 RNA is generally detectable in upper and lower respiratory specimens during the acute phase of infection. The lowest concentration of SARS-CoV-2 viral copies this assay can detect is 250 copies / mL. A negative result does not preclude SARS-CoV-2 infection and should not be used as the sole basis for treatment or other patient management decisions.  A negative result may occur with improper specimen collection / handling, submission of specimen other than nasopharyngeal swab, presence of viral mutation(s) within the areas targeted by this assay, and inadequate number of viral copies (<250  copies / mL). A negative result must be combined with clinical observations, patient history, and epidemiological information.  Fact Sheet for Patients:   StrictlyIdeas.no  Fact Sheet for Healthcare Providers: BankingDealers.co.za  This test is not yet approved or  cleared by the [place] FDA and has been authorized for detection and/or diagnosis of SARS-CoV-2 by FDA under an Emergency Use Authorization (EUA).  This EUA will remain in effect (meaning this test can be used) for the duration of  the COVID-19 declaration under Section 564(b)(1) of the Act, 21 U.S.C. section 360bbb-3(b)(1), unless the authorization is terminated or revoked sooner.  Performed at [facility], [address] [place], [place] [address]   Culture, blood (routine x 2)     Status: None (Preliminary result)   Collection Time: 04/06/20 12:27 AM   Specimen: BLOOD RIGHT HAND  Result Value Ref Range Status   Specimen Description BLOOD RIGHT HAND  Final   Special Requests   Final    BOTTLES DRAWN AEROBIC ONLY Blood Culture adequate volume   Culture   Final    NO GROWTH < 12 HOURS Performed at [facility], [address] [place], [place] [address]    Report Status PENDING  Incomplete    Coagulation Studies: Recent Labs    04/06/20 0146  LABPROT 12.7  INR 1.0    Urinalysis: No results for input(s): COLORURINE, LABSPEC, PHURINE, GLUCOSEU, HGBUR, BILIRUBINUR, KETONESUR, PROTEINUR, UROBILINOGEN, NITRITE, LEUKOCYTESUR in the last 72 hours.  Invalid input(s): APPERANCEUR    Imaging: DG Chest 1 View  Result Date: 04/05/2020 CLINICAL DATA:  Confusion. EXAM: CHEST  1 VIEW COMPARISON:  12/31/2018 FINDINGS: Low lung volumes. Upper normal heart size with mild aortic tortuosity. Vague heterogeneous opacities in both mid lower lung zones. No pneumothorax or significant pleural effusion. Vascular stent in the left upper arm. Bones are under mineralized. IMPRESSION: Low lung volumes with vague heterogeneous opacities in both mid and lower lung zones, atelectasis versus pneumonia, including atypical viral infection. Electronically Signed   By: Keith Rake M.D.   On: 04/05/2020 23:28   CT Head Wo Contrast  Result Date: 04/06/2020 CLINICAL DATA:  69 year old female with altered mental status. EXAM: CT HEAD WITHOUT CONTRAST TECHNIQUE: Contiguous axial images were obtained from the base of the skull through the vertex without intravenous contrast. COMPARISON:  Head CT dated 12/26/2018.  FINDINGS: Brain: Mild age-related atrophy and chronic microvascular ischemic changes. A 2.0 x 1.8 cm right anterior parafalcine partially calcified meningioma similar to prior CT. No surrounding edema. There is no acute intracranial hemorrhage. No mass effect or midline shift. No extra-axial fluid collection. Vascular: No hyperdense vessel or unexpected calcification. Skull: Normal. Negative for fracture or focal lesion. Sinuses/Orbits: No acute finding. Other: None IMPRESSION: 1. No acute intracranial pathology. 2. Mild age-related atrophy and chronic microvascular ischemic changes. 3. Stable right anterior parafalcine meningioma. No associated edema. Electronically Signed   By: Anner Crete M.D.   On: 04/06/2020 01:29   ECHOCARDIOGRAM COMPLETE  Result Date: 04/06/2020    ECHOCARDIOGRAM REPORT   Patient Name:   Madison [name] [facility] Date of Exam: 04/06/2020 Medical Rec #:  [id number]       Height:       63.0 in Accession #:    [id number]      Weight:       158.0 lb Date of Birth:  1951/08/29        BSA:          1.749 m Patient Age:  69 years        BP:           120/60 mmHg Patient Gender: F               HR:           75 bpm. Exam Location:  [facility] Procedure: 2D Echo, Cardiac Doppler and Color Doppler Indications:    Syncope 780.2  History:        Patient has no prior history of Echocardiogram examinations.                 ESRD.  Sonographer:    Sherrie Sport RDCS (AE) Referring Phys: [id number] Athena Masse Diagnosing      Kate Sable MD Phys: IMPRESSIONS  1. Left ventricular ejection fraction, by estimation, is 60 to 65%. The left ventricle has normal function. The left ventricle has no regional wall motion abnormalities. Left ventricular diastolic parameters were normal.  2. Right ventricular systolic function is normal. The right ventricular size is normal. There is normal pulmonary artery systolic pressure.  3. Right atrial size was mildly dilated.  4. The mitral valve is normal in structure. Mild mitral  valve regurgitation.  5. The aortic valve is normal in structure. Aortic valve regurgitation is not visualized.  6. The inferior vena cava is normal in size with greater than 50% respiratory variability, suggesting right atrial pressure of 3 mmHg. FINDINGS  Left Ventricle: Left ventricular ejection fraction, by estimation, is 60 to 65%. The left ventricle has normal function. The left ventricle has no regional wall motion abnormalities. The left ventricular internal cavity size was normal in size. There is  no left ventricular hypertrophy. Left ventricular diastolic parameters were normal. Right Ventricle: The right ventricular size is normal. No increase in right ventricular wall thickness. Right ventricular systolic function is normal. There is normal pulmonary artery systolic pressure. The tricuspid regurgitant velocity is 2.33 m/s, and  with an assumed right atrial pressure of 3 mmHg, the estimated right ventricular systolic pressure is 97.9 mmHg. Left Atrium: Left atrial size was normal in size. Right Atrium: Right atrial size was mildly dilated. Pericardium: There is no evidence of pericardial effusion. Mitral Valve: The mitral valve is normal in structure. Mild mitral valve regurgitation. Tricuspid Valve: The tricuspid valve is normal in structure. Tricuspid valve regurgitation is trivial. Aortic Valve: The aortic valve is normal in structure. Aortic valve regurgitation is not visualized. Aortic valve mean gradient measures 4.0 mmHg. Aortic valve peak gradient measures 6.7 mmHg. Aortic valve area, by VTI measures 2.72 cm. Pulmonic Valve: The pulmonic valve was not well visualized. Pulmonic valve regurgitation is not visualized. Aorta: The aortic root is normal in size and structure. Venous: The inferior vena cava is normal in size with greater than 50% respiratory variability, suggesting right atrial pressure of 3 mmHg. IAS/Shunts: No atrial level shunt detected by color flow Doppler.  LEFT VENTRICLE PLAX 2D  LVIDd:         3.53 cm  Diastology LVIDs:         2.59 cm  LV e' lateral:   7.18 cm/s LV PW:         1.51 cm  LV E/e' lateral: 12.1 LV IVS:        0.75 cm  LV e' medial:    6.74 cm/s LVOT diam:     2.00 cm  LV E/e' medial:  12.9 LV SV:         77 LV SV Index:  44 LVOT Area:     3.14 cm  RIGHT VENTRICLE RV Basal diam:  3.61 cm LEFT ATRIUM             Index       RIGHT ATRIUM           Index LA diam:        2.30 cm 1.31 cm/m  RA Area:     17.20 cm LA Vol (A2C):   44.8 ml 25.61 ml/m RA Volume:   49.40 ml  28.24 ml/m LA Vol (A4C):   36.7 ml 20.98 ml/m LA Biplane Vol: 41.7 ml 23.84 ml/m  AORTIC VALVE                   PULMONIC VALVE AV Area (Vmax):    2.87 cm    PV Vmax:        0.62 m/s AV Area (Vmean):   2.67 cm    PV Peak grad:   1.5 mmHg AV Area (VTI):     2.72 cm    RVOT Peak grad: 3 mmHg AV Vmax:           129.33 cm/s AV Vmean:          88.300 cm/s AV VTI:            0.284 m AV Peak Grad:      6.7 mmHg AV Mean Grad:      4.0 mmHg LVOT Vmax:         118.00 cm/s LVOT Vmean:        75.000 cm/s LVOT VTI:          0.246 m LVOT/AV VTI ratio: 0.87  AORTA Ao Root diam: 3.40 cm MITRAL VALVE                TRICUSPID VALVE MV Area (PHT): 2.86 cm     TR Peak grad:   21.7 mmHg MV Decel Time: 265 msec     TR Vmax:        233.00 cm/s MV E velocity: 87.00 cm/s MV A velocity: 101.00 cm/s  SHUNTS MV E/A ratio:  0.86         Systemic VTI:  0.25 m                             Systemic Diam: 2.00 cm Kate Sable MD Electronically signed by Kate Sable MD Signature Date/Time: 04/06/2020/10:35:57 AM    Final      Medications:   . [START ON 04/07/2020] ceFEPime (MAXIPIME) IV    . heparin 800 Units/hr (04/06/20 0404)  . metronidazole Stopped (04/06/20 0315)  . vancomycin     . [START ON 04/07/2020] aspirin EC  81 mg Oral Daily  . Chlorhexidine Gluconate Cloth  6 each Topical Q0600  . Chlorhexidine Gluconate Cloth  6 each Topical Q0600  . divalproex  250 mg Oral BID  . metoprolol tartrate  12.5 mg Oral BID    acetaminophen, nitroGLYCERIN, ondansetron (ZOFRAN) IV, vancomycin  Assessment/ Plan:  69 y.o. female with past medical history of schizophrenia, bipolar mood disorder, ESRD on HD MWF, anemia of chronic kidney disease, secondary hyperparathyroidism who was found lying on the ground at neighbor's house for an unknown period of time.  [facility]/MWF  1.  ESRD on HD MWF.  Patient underwent dialysis treatment today and tolerated quite well.  We will plan for next dialysis treatment on Friday if still here.  2.  Anemia of chronic kidney disease.  Hemoglobin currently 12.8.  Hold off on Epogen at this time.  3.  Secondary hyperparathyroidism.  Monitor bone mineral metabolism parameters over the course of the hospitalization.  4.  Altered mental status.  Likely toxic metabolic encephalopathy.  Work-up as per hospitalist.   LOS: 0 Floyd Lusignan 7/14/20212:02 PM

## 2020-04-07 DIAGNOSIS — R778 Other specified abnormalities of plasma proteins: Secondary | ICD-10-CM | POA: Diagnosis not present

## 2020-04-07 DIAGNOSIS — Z992 Dependence on renal dialysis: Secondary | ICD-10-CM | POA: Diagnosis not present

## 2020-04-07 DIAGNOSIS — I214 Non-ST elevation (NSTEMI) myocardial infarction: Secondary | ICD-10-CM | POA: Diagnosis not present

## 2020-04-07 DIAGNOSIS — N186 End stage renal disease: Secondary | ICD-10-CM | POA: Diagnosis not present

## 2020-04-07 DIAGNOSIS — J189 Pneumonia, unspecified organism: Secondary | ICD-10-CM | POA: Diagnosis not present

## 2020-04-07 LAB — CBC WITH DIFFERENTIAL/PLATELET
Abs Immature Granulocytes: 0.01 10*3/uL (ref 0.00–0.07)
Basophils Absolute: 0 10*3/uL (ref 0.0–0.1)
Basophils Relative: 1 %
Eosinophils Absolute: 0.1 10*3/uL (ref 0.0–0.5)
Eosinophils Relative: 2 %
HCT: 35.1 % — ABNORMAL LOW (ref 36.0–46.0)
Hemoglobin: 11.8 g/dL — ABNORMAL LOW (ref 12.0–15.0)
Immature Granulocytes: 0 %
Lymphocytes Relative: 21 %
Lymphs Abs: 1.4 10*3/uL (ref 0.7–4.0)
MCH: 32.2 pg (ref 26.0–34.0)
MCHC: 33.6 g/dL (ref 30.0–36.0)
MCV: 95.9 fL (ref 80.0–100.0)
Monocytes Absolute: 0.8 10*3/uL (ref 0.1–1.0)
Monocytes Relative: 11 %
Neutro Abs: 4.3 10*3/uL (ref 1.7–7.7)
Neutrophils Relative %: 65 %
Platelets: 190 10*3/uL (ref 150–400)
RBC: 3.66 MIL/uL — ABNORMAL LOW (ref 3.87–5.11)
RDW: 16.7 % — ABNORMAL HIGH (ref 11.5–15.5)
WBC: 6.7 10*3/uL (ref 4.0–10.5)
nRBC: 0 % (ref 0.0–0.2)

## 2020-04-07 LAB — BASIC METABOLIC PANEL
Anion gap: 13 (ref 5–15)
BUN: 26 mg/dL — ABNORMAL HIGH (ref 8–23)
CO2: 29 mmol/L (ref 22–32)
Calcium: 9.5 mg/dL (ref 8.9–10.3)
Chloride: 97 mmol/L — ABNORMAL LOW (ref 98–111)
Creatinine, Ser: 4.96 mg/dL — ABNORMAL HIGH (ref 0.44–1.00)
GFR calc Af Amer: 10 mL/min — ABNORMAL LOW (ref >60)
GFR calc non Af Amer: 8 mL/min — ABNORMAL LOW (ref >60)
Glucose, Bld: 80 mg/dL (ref 70–99)
Potassium: 4.3 mmol/L (ref 3.5–5.1)
Sodium: 139 mmol/L (ref 135–145)

## 2020-04-07 LAB — CK: Total CK: 2226 U/L — ABNORMAL HIGH (ref 38–234)

## 2020-04-07 LAB — TROPONIN I (HIGH SENSITIVITY): Troponin I (High Sensitivity): 54 ng/L — ABNORMAL HIGH (ref <18)

## 2020-04-07 LAB — MRSA PCR SCREENING: MRSA by PCR: POSITIVE — AB

## 2020-04-07 LAB — HEPARIN LEVEL (UNFRACTIONATED): Heparin Unfractionated: 0.53 IU/mL (ref 0.30–0.70)

## 2020-04-07 LAB — PARATHYROID HORMONE, INTACT (NO CA): PTH: 237 pg/mL — ABNORMAL HIGH (ref 15–65)

## 2020-04-07 LAB — LACTIC ACID, PLASMA: Lactic Acid, Venous: 1.4 mmol/L (ref 0.5–1.9)

## 2020-04-07 LAB — MAGNESIUM: Magnesium: 1.8 mg/dL (ref 1.7–2.4)

## 2020-04-07 MED ORDER — THIAMINE HCL 100 MG PO TABS
50.0000 mg | ORAL_TABLET | Freq: Every day | ORAL | Status: DC
  Administered 2020-04-07 – 2020-04-18 (×12): 50 mg via ORAL
  Filled 2020-04-07 (×12): qty 1

## 2020-04-07 MED ORDER — AZITHROMYCIN 500 MG PO TABS
500.0000 mg | ORAL_TABLET | Freq: Every day | ORAL | Status: AC
  Administered 2020-04-07 – 2020-04-09 (×3): 500 mg via ORAL
  Filled 2020-04-07 (×2): qty 2
  Filled 2020-04-07: qty 1

## 2020-04-07 MED ORDER — HEPARIN SODIUM (PORCINE) 5000 UNIT/ML IJ SOLN
5000.0000 [IU] | Freq: Three times a day (TID) | INTRAMUSCULAR | Status: DC
  Administered 2020-04-07 – 2020-04-18 (×33): 5000 [IU] via SUBCUTANEOUS
  Filled 2020-04-07 (×32): qty 1

## 2020-04-07 MED ORDER — SODIUM CHLORIDE 0.9 % IV SOLN
1.0000 g | INTRAVENOUS | Status: DC
  Administered 2020-04-07 – 2020-04-09 (×3): 1 g via INTRAVENOUS
  Filled 2020-04-07 (×2): qty 10
  Filled 2020-04-07 (×2): qty 1

## 2020-04-07 MED ORDER — SODIUM CHLORIDE 0.9 % IV SOLN: INTRAVENOUS | Status: DC

## 2020-04-07 MED ORDER — PANTOPRAZOLE SODIUM 40 MG PO TBEC
40.0000 mg | DELAYED_RELEASE_TABLET | Freq: Every day | ORAL | Status: DC
  Administered 2020-04-07 – 2020-04-18 (×12): 40 mg via ORAL
  Filled 2020-04-07 (×12): qty 1

## 2020-04-07 MED ORDER — DM-GUAIFENESIN ER 30-600 MG PO TB12
1.0000 | ORAL_TABLET | Freq: Two times a day (BID) | ORAL | Status: DC
  Administered 2020-04-07 – 2020-04-18 (×22): 1 via ORAL
  Filled 2020-04-07 (×22): qty 1

## 2020-04-07 MED ORDER — VANCOMYCIN HCL IN DEXTROSE 750-5 MG/150ML-% IV SOLN: 750.0000 mg | INTRAVENOUS | Status: DC

## 2020-04-07 MED ORDER — ALPRAZOLAM 0.25 MG PO TABS
0.2500 mg | ORAL_TABLET | Freq: Three times a day (TID) | ORAL | Status: DC | PRN
  Administered 2020-04-07 – 2020-04-11 (×2): 0.25 mg via ORAL
  Filled 2020-04-07 (×2): qty 1

## 2020-04-07 MED ORDER — OLANZAPINE 7.5 MG PO TABS
15.0000 mg | ORAL_TABLET | Freq: Every day | ORAL | Status: DC
  Administered 2020-04-07 – 2020-04-17 (×11): 15 mg via ORAL
  Filled 2020-04-07 (×12): qty 2

## 2020-04-07 NOTE — Hospital Course (Signed)
Madison [name] is a 69 y.o. female with medical history significant for schizoaffective disorder bipolar type, ESRD on HD MWF last dialysis 04/04/2020, with no missed dialysis sessions who was brought in after she was found lying on the ground at the neighbors house for an unknown period of time and with empty pill packs found by patient's sister but unknown if patient overdosed.  Patient later admitted to taking all the pills she could find because she was "tired of it all".  Sister also reported recent increased confusion with the patient, but no recent illnesses, fevers/chills or complaints of other symptoms.  In the ED, patient awake but confused.  Normal vitals.  EKG normal sinus rhythm and no acute ST-T wave changes.  Pertinent labs include WBC 10.7k, CK 1711, troponin 158>>173.  Lactic acid 2.8 >> 2.9.  Normal depakote level.  CXR showed mid and lower lung zones opacities concerning for pneumonia including atypical viral infection vs atelectasis.  Covid-19 PCR negative.  She was fluid resuscitated and treated with broad spectrum antibiotics in the ED per sepsis protocol.  Admitted to hospitalist service.  Cardiology consulted.

## 2020-04-07 NOTE — Progress Notes (Signed)
[facility] Cardiology     SUBJECTIVE: Persistent confusion but pleasant   Vitals:   04/06/20 2027 04/07/20 0557 04/07/20 0632 04/07/20 0712  BP: (!) 127/58 (!) 158/75  (!) 146/65  Pulse: 73 77  66  Resp: 16 18  16   Temp: 98.4 F (36.9 C) 98.2 F (36.8 C)  98.1 F (36.7 C)  TempSrc: Oral Oral  Oral  SpO2: 98% 99%  98%  Weight:   68.9 kg   Height:         Intake/Output Summary (Last 24 hours) at 04/07/2020 0943 Last data filed at 04/07/2020 0404 Gross per 24 hour  Intake 694.5 ml  Output 1 ml  Net 693.5 ml      PHYSICAL EXAM  General: Well developed, well nourished, in no acute distress HEENT:  Normocephalic and atramatic Neck:  No JVD.  Lungs: Clear bilaterally to auscultation and percussion. Heart: HRRR . Normal S1 and S2 without gallops or murmurs.  Abdomen: Bowel sounds are positive, abdomen soft and non-tender  Msk:  Back normal, normal gait. Normal strength and tone for age. Extremities: No clubbing, cyanosis or edema.   Neuro: Alert and oriented X 3. Psych:  Good affect, responds appropriately   LABS: Basic Metabolic Panel: Recent Labs    04/05/20 1938 04/06/20 1555 04/07/20 0131  NA 141  --  139  K 4.2  --  4.3  CL 94*  --  97*  CO2 29  --  29  GLUCOSE 90  --  80  BUN 43*  --  26*  CREATININE 7.37*  --  4.96*  CALCIUM 9.7  --  9.5  MG  --   --  1.8  PHOS  --  2.6  --    Liver Function Tests: Recent Labs    04/05/20 1938  AST 69*  ALT 27  ALKPHOS 88  BILITOT 0.8  PROT 8.3*  ALBUMIN 4.1   No results for input(s): LIPASE, AMYLASE in the last 72 hours. CBC: Recent Labs    04/06/20 1555 04/07/20 0131  WBC 5.8 6.7  NEUTROABS  --  4.3  HGB 12.0 11.8*  HCT 35.9* 35.1*  MCV 96.8 95.9  PLT 200 190   Cardiac Enzymes: Recent Labs    04/05/20 1938 04/07/20 0753  CKTOTAL 1,711* 2,226*   BNP: Invalid input(s): POCBNP D-Dimer: No results for input(s): DDIMER in the last 72 hours. Hemoglobin A1C: No results for input(s): HGBA1C in the  last 72 hours. Fasting Lipid Panel: No results for input(s): CHOL, HDL, LDLCALC, TRIG, CHOLHDL, LDLDIRECT in the last 72 hours. Thyroid Function Tests: No results for input(s): TSH, T4TOTAL, T3FREE, THYROIDAB in the last 72 hours.  Invalid input(s): FREET3 Anemia Panel: No results for input(s): VITAMINB12, FOLATE, FERRITIN, TIBC, IRON, RETICCTPCT in the last 72 hours.  DG Chest 1 View  Result Date: 04/05/2020 CLINICAL DATA:  Confusion. EXAM: CHEST  1 VIEW COMPARISON:  01/08/2019 FINDINGS: Low lung volumes. Upper normal heart size with mild aortic tortuosity. Vague heterogeneous opacities in both mid lower lung zones. No pneumothorax or significant pleural effusion. Vascular stent in the left upper arm. Bones are under mineralized. IMPRESSION: Low lung volumes with vague heterogeneous opacities in both mid and lower lung zones, atelectasis versus pneumonia, including atypical viral infection. Electronically Signed   By: Keith Rake M.D.   On: 04/05/2020 23:28   CT Head Wo Contrast  Result Date: 04/06/2020 CLINICAL DATA:  69 year old female with altered mental status. EXAM: CT HEAD WITHOUT CONTRAST TECHNIQUE: Contiguous  axial images were obtained from the base of the skull through the vertex without intravenous contrast. COMPARISON:  Head CT dated 12/27/2018. FINDINGS: Brain: Mild age-related atrophy and chronic microvascular ischemic changes. A 2.0 x 1.8 cm right anterior parafalcine partially calcified meningioma similar to prior CT. No surrounding edema. There is no acute intracranial hemorrhage. No mass effect or midline shift. No extra-axial fluid collection. Vascular: No hyperdense vessel or unexpected calcification. Skull: Normal. Negative for fracture or focal lesion. Sinuses/Orbits: No acute finding. Other: None IMPRESSION: 1. No acute intracranial pathology. 2. Mild age-related atrophy and chronic microvascular ischemic changes. 3. Stable right anterior parafalcine meningioma. No  associated edema. Electronically Signed   By: Anner Crete M.D.   On: 04/06/2020 01:29   ECHOCARDIOGRAM COMPLETE  Result Date: 04/06/2020    ECHOCARDIOGRAM REPORT   Patient Name:   Madison [name] [facility] Date of Exam: 04/06/2020 Medical Rec #:  [id number]       Height:       63.0 in Accession #:    [id number]      Weight:       158.0 lb Date of Birth:  [birth year]        BSA:          1.749 m Patient Age:    69 years        BP:           120/60 mmHg Patient Gender: F               HR:           75 bpm. Exam Location:  [facility] Procedure: 2D Echo, Cardiac Doppler and Color Doppler Indications:    Syncope 780.2  History:        Patient has no prior history of Echocardiogram examinations.                 ESRD.  Sonographer:    Sherrie Sport RDCS (AE) Referring Phys: [id number] Athena Masse Diagnosing      Kate Sable MD Phys: IMPRESSIONS  1. Left ventricular ejection fraction, by estimation, is 60 to 65%. The left ventricle has normal function. The left ventricle has no regional wall motion abnormalities. Left ventricular diastolic parameters were normal.  2. Right ventricular systolic function is normal. The right ventricular size is normal. There is normal pulmonary artery systolic pressure.  3. Right atrial size was mildly dilated.  4. The mitral valve is normal in structure. Mild mitral valve regurgitation.  5. The aortic valve is normal in structure. Aortic valve regurgitation is not visualized.  6. The inferior vena cava is normal in size with greater than 50% respiratory variability, suggesting right atrial pressure of 3 mmHg. FINDINGS  Left Ventricle: Left ventricular ejection fraction, by estimation, is 60 to 65%. The left ventricle has normal function. The left ventricle has no regional wall motion abnormalities. The left ventricular internal cavity size was normal in size. There is  no left ventricular hypertrophy. Left ventricular diastolic parameters were normal. Right Ventricle: The right ventricular size is  normal. No increase in right ventricular wall thickness. Right ventricular systolic function is normal. There is normal pulmonary artery systolic pressure. The tricuspid regurgitant velocity is 2.33 m/s, and  with an assumed right atrial pressure of 3 mmHg, the estimated right ventricular systolic pressure is 65.5 mmHg. Left Atrium: Left atrial size was normal in size. Right Atrium: Right atrial size was mildly dilated. Pericardium: There is no evidence of pericardial effusion. Mitral Valve: The  mitral valve is normal in structure. Mild mitral valve regurgitation. Tricuspid Valve: The tricuspid valve is normal in structure. Tricuspid valve regurgitation is trivial. Aortic Valve: The aortic valve is normal in structure. Aortic valve regurgitation is not visualized. Aortic valve mean gradient measures 4.0 mmHg. Aortic valve peak gradient measures 6.7 mmHg. Aortic valve area, by VTI measures 2.72 cm. Pulmonic Valve: The pulmonic valve was not well visualized. Pulmonic valve regurgitation is not visualized. Aorta: The aortic root is normal in size and structure. Venous: The inferior vena cava is normal in size with greater than 50% respiratory variability, suggesting right atrial pressure of 3 mmHg. IAS/Shunts: No atrial level shunt detected by color flow Doppler.  LEFT VENTRICLE PLAX 2D LVIDd:         3.53 cm  Diastology LVIDs:         2.59 cm  LV e' lateral:   7.18 cm/s LV PW:         1.51 cm  LV E/e' lateral: 12.1 LV IVS:        0.75 cm  LV e' medial:    6.74 cm/s LVOT diam:     2.00 cm  LV E/e' medial:  12.9 LV SV:         77 LV SV Index:   44 LVOT Area:     3.14 cm  RIGHT VENTRICLE RV Basal diam:  3.61 cm LEFT ATRIUM             Index       RIGHT ATRIUM           Index LA diam:        2.30 cm 1.31 cm/m  RA Area:     17.20 cm LA Vol (A2C):   44.8 ml 25.61 ml/m RA Volume:   49.40 ml  28.24 ml/m LA Vol (A4C):   36.7 ml 20.98 ml/m LA Biplane Vol: 41.7 ml 23.84 ml/m  AORTIC VALVE                   PULMONIC VALVE  AV Area (Vmax):    2.87 cm    PV Vmax:        0.62 m/s AV Area (Vmean):   2.67 cm    PV Peak grad:   1.5 mmHg AV Area (VTI):     2.72 cm    RVOT Peak grad: 3 mmHg AV Vmax:           129.33 cm/s AV Vmean:          88.300 cm/s AV VTI:            0.284 m AV Peak Grad:      6.7 mmHg AV Mean Grad:      4.0 mmHg LVOT Vmax:         118.00 cm/s LVOT Vmean:        75.000 cm/s LVOT VTI:          0.246 m LVOT/AV VTI ratio: 0.87  AORTA Ao Root diam: 3.40 cm MITRAL VALVE                TRICUSPID VALVE MV Area (PHT): 2.86 cm     TR Peak grad:   21.7 mmHg MV Decel Time: 265 msec     TR Vmax:        233.00 cm/s MV E velocity: 87.00 cm/s MV A velocity: 101.00 cm/s  SHUNTS MV E/A ratio:  0.86         Systemic  VTI:  0.25 m                             Systemic Diam: 2.00 cm Kate Sable MD Electronically signed by Kate Sable MD Signature Date/Time: 04/06/2020/10:35:57 AM    Final      Echo normal overall left ventricular function ejection fraction of 60%  TELEMETRY: Normal sinus rhythm nonspecific ST-T changes:  ASSESSMENT AND PLAN:  Principal Problem:   Acute metabolic encephalopathy Active Problems:   ESRD on hemodialysis (HCC)   Schizoaffective disorder, bipolar type (HCC)   Rhabdomyolysis   Elevated troponin   Pneumonia   Lactic acidosis   Suspected Sepsis ([place])   Possible NSTEMI (non-ST elevated myocardial infarction) ([place])    Suspected Overdose, undetermined intent, initial encounter    Plan Encephalopathy persistent unclear etiology End-stage renal disease on dialysis Schizoaffective disorder bipolar type continue current therapy Borderline troponins probably due to renal failure Doubt non-STEMI this is probably demand ischemia and end-stage renal disease Possible pneumonia continue antibiotic therapy as necessary Direct cardiac intervention or modifications necessary at this point   Yolonda Kida, MD 04/07/2020 9:43 AM

## 2020-04-07 NOTE — Progress Notes (Signed)
PROGRESS NOTE    Madison [name]   BTD:[id number]  DOB: [birth year]  PCP: Vidal Schwalbe, MD    DOA: 04/05/2020 LOS: 1   Brief Narrative   ICYSS SKOG is a 69 y.o. female with medical history significant for schizoaffective disorder bipolar type, ESRD on HD MWF last dialysis 04/04/2020, with no missed dialysis sessions who was brought in after she was found lying on the ground at the neighbors house for an unknown period of time and with empty pill packs found by patient's sister but unknown if patient overdosed.  Patient later admitted to taking all the pills she could find because she was "tired of it all".  Sister also reported recent increased confusion with the patient, but no recent illnesses, fevers/chills or complaints of other symptoms.  In the ED, patient awake but confused.  Normal vitals.  EKG normal sinus rhythm and no acute ST-T wave changes.  Pertinent labs include WBC 10.7k, CK 1711, troponin 158>>173.  Lactic acid 2.8 >> 2.9.  Normal depakote level.  CXR showed mid and lower lung zones opacities concerning for pneumonia including atypical viral infection vs atelectasis.  Covid-19 PCR negative.  She was fluid resuscitated and treated with broad spectrum antibiotics in the ED per sepsis protocol.  Admitted to hospitalist service.  Cardiology consulted.      Assessment & Plan   Principal Problem:   Acute metabolic encephalopathy Active Problems:   ESRD on hemodialysis (HCC)   Schizoaffective disorder, bipolar type (HCC)   Rhabdomyolysis   Elevated troponin   Pneumonia   Lactic acidosis   Suspected Sepsis ([place])   Possible NSTEMI (non-ST elevated myocardial infarction) ([place])    Suspected Overdose, undetermined intent, initial encounter   Acute metabolic encephalopathy - POA, persistent today but making more sense, still disoriented to place at times. Patient was found with empty pill packets and admitted to attempted overdose.  Unable to say what she took.  Continue  neuro checks, aspiration precautions.  Monitor.  CT head negative for acute findings.  Suicide attempt by intentional overdose- continue 1:1 sitter.  Psychiatry is consulted, recommendations pending.  Sepsis due to Community-acquired PNA / Lactic acidosis - sepsis POA with elevated lactate, mild leukocytosis, but stable vitals.  Fluid resuscitated and treated with broad-spectrum antibiotics in the ED.  Lactic acidosis resolved. Will de-escalate antibiotics to Rocephin and Zithromax.  Scheduled Mucinex.  Follow-up on cultures.  Rhabdomyolysis, acute, nontraumatic - POA with CK over 1500  >> 2226 despite having dialysis. Trend CK with AM labs.  Dialysis due again tomorrow.  Unable to give IV fluids in dialysis patient, will overload her.  Elevated troponin - POA with troponin's 158 >> 173 >> 54 this AM.  Cardiology consulted.  Feels T wave changes were related to HTN urgency and troponin elevation due to demand ischemia.  Patient free of chest pain.  Heparin drip stopped.   Continue ASA, stain, beta blocker.  ESRD on hemodialysis M/W/F - had not missed any sessions except day of admission.  Nephrology consulted for dialysis.  Schizoaffective disorder, bipolar type - Depakote level within normal limits on admission.  Psychiatry is consulted given suicide attempt.  Continue Zyprexa, Depakote.  GERD - continue Protonix  Neuropathy - hold gabapentin given AMS  DVT prophylaxis: heparin injection 5,000 Units Start: 04/07/20 2200   Diet:  Diet Orders (From admission, onward)    Start     Ordered   04/06/20 0216  Diet Heart Room service appropriate? Yes; Fluid consistency: Thin  Diet effective now       Question Answer Comment  Room service appropriate? Yes   Fluid consistency: Thin      04/06/20 0219            Code Status: Full Code    Subjective 04/07/20    Patient seen with sitter at bedside this AM.  She was sleeping but awoke easily.  She is disoriented to place and situation,  thinks she is in [place] at dialysis clinic.  She denies fevers or chills, pain or discomfort, difficulty breathing, chest pain.  Unable to tell me any specific complaints due to confusion.   Disposition Plan & Communication   Status is: Inpatient  Remains inpatient appropriate because:Inpatient level of care appropriate due to severity of illness   Dispo: The patient is from: Home              Anticipated d/c is to: Home              Anticipated d/c date is: 2 days              Patient currently is not medically stable to d/c.    Family Communication: none at bedside, will attempt to call    Consults, Procedures, Significant Events   Consultants:   Cardiology  Nephrology  Procedures:   Dialysis  Antimicrobials:   Rocephin and Zithromax 7/15 >>  Vancomycin, Cefepime, Flagyl 7/14 >> 7/15     Objective   Vitals:   04/06/20 2027 04/07/20 0557 04/07/20 0632 04/07/20 0712  BP: (!) 127/58 (!) 158/75  (!) 146/65  Pulse: 73 77  66  Resp: 16 18  16   Temp: 98.4 F (36.9 C) 98.2 F (36.8 C)  98.1 F (36.7 C)  TempSrc: Oral Oral  Oral  SpO2: 98% 99%  98%  Weight:   68.9 kg   Height:        Intake/Output Summary (Last 24 hours) at 04/07/2020 0738 Last data filed at 04/07/2020 0404 Gross per 24 hour  Intake 794.5 ml  Output 1 ml  Net 793.5 ml   Filed Weights   04/05/20 1901 04/06/20 1752 04/07/20 0632  Weight: 71.7 kg 56.1 kg 68.9 kg    Physical Exam:  General exam: sleeping, easily awakes to voice, no acute distress HEENT: moist mucus membranes, hearing grossly normal  Respiratory system: diminished bases with faint crackles, no wheezes, normal respiratory effort. Cardiovascular system: normal S1/S2, RRR, no pedal edema.   Central nervous system: A&O x2 (person and time). no focal neurologic deficits, normal speech Extremities: moves all, no edema, normal tone Skin: dry, intact, normal temperature, normal color Psychiatry: normal mood, congruent  affect, judgement and insight appear normal  Labs   Data Reviewed: I have personally reviewed following labs and imaging studies  CBC: Recent Labs  Lab 04/05/20 1911 04/06/20 1555 04/07/20 0131  WBC 10.7* 5.8 6.7  NEUTROABS  --   --  4.3  HGB 12.8 12.0 11.8*  HCT 37.9 35.9* 35.1*  MCV 95.9 96.8 95.9  PLT 198 200 010   Basic Metabolic Panel: Recent Labs  Lab 04/05/20 1938 04/06/20 1555 04/07/20 0131  NA 141  --  139  K 4.2  --  4.3  CL 94*  --  97*  CO2 29  --  29  GLUCOSE 90  --  80  BUN 43*  --  26*  CREATININE 7.37*  --  4.96*  CALCIUM 9.7  --  9.5  MG  --   --  1.8  PHOS  --  2.6  --    GFR: Estimated Creatinine Clearance: 9.4 mL/min (A) (by C-G formula based on SCr of 4.96 mg/dL (H)). Liver Function Tests: Recent Labs  Lab 04/05/20 1938  AST 69*  ALT 27  ALKPHOS 88  BILITOT 0.8  PROT 8.3*  ALBUMIN 4.1   No results for input(s): LIPASE, AMYLASE in the last 168 hours. No results for input(s): AMMONIA in the last 168 hours. Coagulation Profile: Recent Labs  Lab 04/06/20 0146  INR 1.0   Cardiac Enzymes: Recent Labs  Lab 04/05/20 1938  CKTOTAL 1,711*   BNP (last 3 results) No results for input(s): PROBNP in the last 8760 hours. HbA1C: No results for input(s): HGBA1C in the last 72 hours. CBG: No results for input(s): GLUCAP in the last 168 hours. Lipid Profile: No results for input(s): CHOL, HDL, LDLCALC, TRIG, CHOLHDL, LDLDIRECT in the last 72 hours. Thyroid Function Tests: No results for input(s): TSH, T4TOTAL, FREET4, T3FREE, THYROIDAB in the last 72 hours. Anemia Panel: No results for input(s): VITAMINB12, FOLATE, FERRITIN, TIBC, IRON, RETICCTPCT in the last 72 hours. Sepsis Labs: Recent Labs  Lab 04/06/20 0027 04/06/20 0227  LATICACIDVEN 2.8* 2.9*    Recent Results (from the past 240 hour(s))  Culture, blood (routine x 2)     Status: None (Preliminary result)   Collection Time: 04/05/20 11:08 PM   Specimen: BLOOD RIGHT FOREARM    Result Value Ref Range Status   Specimen Description BLOOD RIGHT FOREARM  Final   Special Requests   Final    BOTTLES DRAWN AEROBIC ONLY Blood Culture adequate volume   Culture   Final    NO GROWTH 1 DAY Performed at [facility], [address], [place], [place] [address]    Report Status PENDING  Incomplete  SARS Coronavirus 2 by RT PCR (hospital order, performed in [place] hospital lab) Nasopharyngeal Nasopharyngeal Swab     Status: None   Collection Time: 04/05/20 11:31 PM   Specimen: Nasopharyngeal Swab  Result Value Ref Range Status   SARS Coronavirus 2 NEGATIVE NEGATIVE Final    Comment: (NOTE) SARS-CoV-2 target nucleic acids are NOT DETECTED.  The SARS-CoV-2 RNA is generally detectable in upper and lower respiratory specimens during the acute phase of infection. The lowest concentration of SARS-CoV-2 viral copies this assay can detect is 250 copies / mL. A negative result does not preclude SARS-CoV-2 infection and should not be used as the sole basis for treatment or other patient management decisions.  A negative result may occur with improper specimen collection / handling, submission of specimen other than nasopharyngeal swab, presence of viral mutation(s) within the areas targeted by this assay, and inadequate number of viral copies (<250 copies / mL). A negative result must be combined with clinical observations, patient history, and epidemiological information.  Fact Sheet for Patients:   StrictlyIdeas.no  Fact Sheet for Healthcare Providers: BankingDealers.co.za  This test is not yet approved or  cleared by the [place] FDA and has been authorized for detection and/or diagnosis of SARS-CoV-2 by FDA under an Emergency Use Authorization (EUA).  This EUA will remain in effect (meaning this test can be used) for the duration of the COVID-19 declaration under Section 564(b)(1) of the Act, 21  U.S.C. section 360bbb-3(b)(1), unless the authorization is terminated or revoked sooner.  Performed at [facility], [address] [place], [place] [address]   Culture, blood (routine x 2)     Status: None (Preliminary  result)   Collection Time: 04/06/20 12:27 AM   Specimen: BLOOD RIGHT HAND  Result Value Ref Range Status   Specimen Description BLOOD RIGHT HAND  Final   Special Requests   Final    BOTTLES DRAWN AEROBIC ONLY Blood Culture adequate volume   Culture   Final    NO GROWTH 1 DAY Performed at [facility], [address], [place], [place] [address]    Report Status PENDING  Incomplete      Imaging Studies   DG Chest 1 View  Result Date: 04/05/2020 CLINICAL DATA:  Confusion. EXAM: CHEST  1 VIEW COMPARISON:  01/13/2019 FINDINGS: Low lung volumes. Upper normal heart size with mild aortic tortuosity. Vague heterogeneous opacities in both mid lower lung zones. No pneumothorax or significant pleural effusion. Vascular stent in the left upper arm. Bones are under mineralized. IMPRESSION: Low lung volumes with vague heterogeneous opacities in both mid and lower lung zones, atelectasis versus pneumonia, including atypical viral infection. Electronically Signed   By: Keith Rake M.D.   On: 04/05/2020 23:28   CT Head Wo Contrast  Result Date: 04/06/2020 CLINICAL DATA:  69 year old female with altered mental status. EXAM: CT HEAD WITHOUT CONTRAST TECHNIQUE: Contiguous axial images were obtained from the base of the skull through the vertex without intravenous contrast. COMPARISON:  Head CT dated 01/07/2019. FINDINGS: Brain: Mild age-related atrophy and chronic microvascular ischemic changes. A 2.0 x 1.8 cm right anterior parafalcine partially calcified meningioma similar to prior CT. No surrounding edema. There is no acute intracranial hemorrhage. No mass effect or midline shift. No extra-axial fluid collection. Vascular: No hyperdense vessel or unexpected  calcification. Skull: Normal. Negative for fracture or focal lesion. Sinuses/Orbits: No acute finding. Other: None IMPRESSION: 1. No acute intracranial pathology. 2. Mild age-related atrophy and chronic microvascular ischemic changes. 3. Stable right anterior parafalcine meningioma. No associated edema. Electronically Signed   By: Anner Crete M.D.   On: 04/06/2020 01:29   ECHOCARDIOGRAM COMPLETE  Result Date: 04/06/2020    ECHOCARDIOGRAM REPORT   Patient Name:   JULIUS BONIFACE [facility] Date of Exam: 04/06/2020 Medical Rec #:  [id number]       Height:       63.0 in Accession #:    [id number]      Weight:       158.0 lb Date of Birth:  [birth year]        BSA:          1.749 m Patient Age:    71 years        BP:           120/60 mmHg Patient Gender: F               HR:           75 bpm. Exam Location:  [facility] Procedure: 2D Echo, Cardiac Doppler and Color Doppler Indications:    Syncope 780.2  History:        Patient has no prior history of Echocardiogram examinations.                 ESRD.  Sonographer:    Sherrie Sport RDCS (AE) Referring Phys: [id number] Athena Masse Diagnosing      Kate Sable MD Phys: IMPRESSIONS  1. Left ventricular ejection fraction, by estimation, is 60 to 65%. The left ventricle has normal function. The left ventricle has no regional wall motion abnormalities. Left ventricular diastolic parameters were normal.  2. Right ventricular systolic function is  normal. The right ventricular size is normal. There is normal pulmonary artery systolic pressure.  3. Right atrial size was mildly dilated.  4. The mitral valve is normal in structure. Mild mitral valve regurgitation.  5. The aortic valve is normal in structure. Aortic valve regurgitation is not visualized.  6. The inferior vena cava is normal in size with greater than 50% respiratory variability, suggesting right atrial pressure of 3 mmHg. FINDINGS  Left Ventricle: Left ventricular ejection fraction, by estimation, is 60 to 65%. The left ventricle  has normal function. The left ventricle has no regional wall motion abnormalities. The left ventricular internal cavity size was normal in size. There is  no left ventricular hypertrophy. Left ventricular diastolic parameters were normal. Right Ventricle: The right ventricular size is normal. No increase in right ventricular wall thickness. Right ventricular systolic function is normal. There is normal pulmonary artery systolic pressure. The tricuspid regurgitant velocity is 2.33 m/s, and  with an assumed right atrial pressure of 3 mmHg, the estimated right ventricular systolic pressure is 24.0 mmHg. Left Atrium: Left atrial size was normal in size. Right Atrium: Right atrial size was mildly dilated. Pericardium: There is no evidence of pericardial effusion. Mitral Valve: The mitral valve is normal in structure. Mild mitral valve regurgitation. Tricuspid Valve: The tricuspid valve is normal in structure. Tricuspid valve regurgitation is trivial. Aortic Valve: The aortic valve is normal in structure. Aortic valve regurgitation is not visualized. Aortic valve mean gradient measures 4.0 mmHg. Aortic valve peak gradient measures 6.7 mmHg. Aortic valve area, by VTI measures 2.72 cm. Pulmonic Valve: The pulmonic valve was not well visualized. Pulmonic valve regurgitation is not visualized. Aorta: The aortic root is normal in size and structure. Venous: The inferior vena cava is normal in size with greater than 50% respiratory variability, suggesting right atrial pressure of 3 mmHg. IAS/Shunts: No atrial level shunt detected by color flow Doppler.  LEFT VENTRICLE PLAX 2D LVIDd:         3.53 cm  Diastology LVIDs:         2.59 cm  LV e' lateral:   7.18 cm/s LV PW:         1.51 cm  LV E/e' lateral: 12.1 LV IVS:        0.75 cm  LV e' medial:    6.74 cm/s LVOT diam:     2.00 cm  LV E/e' medial:  12.9 LV SV:         77 LV SV Index:   44 LVOT Area:     3.14 cm  RIGHT VENTRICLE RV Basal diam:  3.61 cm LEFT ATRIUM              Index       RIGHT ATRIUM           Index LA diam:        2.30 cm 1.31 cm/m  RA Area:     17.20 cm LA Vol (A2C):   44.8 ml 25.61 ml/m RA Volume:   49.40 ml  28.24 ml/m LA Vol (A4C):   36.7 ml 20.98 ml/m LA Biplane Vol: 41.7 ml 23.84 ml/m  AORTIC VALVE                   PULMONIC VALVE AV Area (Vmax):    2.87 cm    PV Vmax:        0.62 m/s AV Area (Vmean):   2.67 cm    PV Peak grad:  1.5 mmHg AV Area (VTI):     2.72 cm    RVOT Peak grad: 3 mmHg AV Vmax:           129.33 cm/s AV Vmean:          88.300 cm/s AV VTI:            0.284 m AV Peak Grad:      6.7 mmHg AV Mean Grad:      4.0 mmHg LVOT Vmax:         118.00 cm/s LVOT Vmean:        75.000 cm/s LVOT VTI:          0.246 m LVOT/AV VTI ratio: 0.87  AORTA Ao Root diam: 3.40 cm MITRAL VALVE                TRICUSPID VALVE MV Area (PHT): 2.86 cm     TR Peak grad:   21.7 mmHg MV Decel Time: 265 msec     TR Vmax:        233.00 cm/s MV E velocity: 87.00 cm/s MV A velocity: 101.00 cm/s  SHUNTS MV E/A ratio:  0.86         Systemic VTI:  0.25 m                             Systemic Diam: 2.00 cm Kate Sable MD Electronically signed by Kate Sable MD Signature Date/Time: 04/06/2020/10:35:57 AM    Final      Medications   Scheduled Meds: . aspirin EC  81 mg Oral Daily  . Chlorhexidine Gluconate Cloth  6 each Topical Q0600  . Chlorhexidine Gluconate Cloth  6 each Topical Q0600  . divalproex  250 mg Oral BID  . docusate sodium  100 mg Oral BID  . metoprolol tartrate  12.5 mg Oral BID   Continuous Infusions: . sodium chloride    . sodium chloride    . ceFEPime (MAXIPIME) IV Stopped (04/07/20 0306)  . heparin 800 Units/hr (04/07/20 0404)  . vancomycin         LOS: 1 day    Time spent: 30 minutes    Ezekiel Slocumb, DO [facility]  04/07/2020, 7:38 AM    If 7PM-7AM, please contact night-coverage. How to contact the [facility] Attending or Consulting provider [place] or covering provider during after hours [place], for this  patient?    1. Check the care team in [facility] and look for a) attending/consulting [facility] provider listed and b) the [facility] team listed 2. Log into [email or web address] and use [place]'s universal password to access. If you do not have the password, please contact the hospital operator. 3. Locate the [facility] provider you are looking for under [facility] and page to a number that you can be directly reached. 4. If you still have difficulty reaching the provider, please page the [facility] (Director on Call) for the Hospitalists listed on amion for assistance.

## 2020-04-07 NOTE — Plan of Care (Signed)
Continue with current plan of care. Pt remains confused axox2 to person and knows she is the hospital. Gets periods of irritability anxiety and agitaton which add to barriers in her care and education of plan of care. Sitter remains will continue to monitor

## 2020-04-07 NOTE — Plan of Care (Signed)
Pt confused agitated and restless this AM. Denies pain. Reports of paranoid statements as someone poisoned her food. Several attempts to get out of bed. Redirection unsuccessful. Sitter in place. Behavior escalating. Received order from MD xanax 0.25 administered results pending. Will continue to monitor

## 2020-04-07 NOTE — Progress Notes (Signed)
ANTICOAGULATION CONSULT NOTE  Pharmacy Consult for heparin Indication: chest pain/ACS  No Known Allergies  Patient Measurements: Height: 5\' 1"  (154.9 cm) Weight: 56.1 kg (123 lb 9.6 oz) IBW/kg (Calculated) : 47.8 Heparin Dosing Weight: 60 kg  Vital Signs: Temp: 98.4 F (36.9 C) (07/14 2027) Temp Source: Oral (07/14 2027) BP: 127/58 (07/14 2027) Pulse Rate: 73 (07/14 2027)  Labs: Recent Labs    04/05/20 1911 04/05/20 1911 04/05/20 1938 04/06/20 0146 04/06/20 0205 04/06/20 1555 04/07/20 0131  HGB 12.8   < >  --   --   --  12.0 11.8*  HCT 37.9  --   --   --   --  35.9* 35.1*  PLT 198  --   --   --   --  200 190  APTT  --   --   --  28  --   --   --   LABPROT  --   --   --  12.7  --   --   --   INR  --   --   --  1.0  --   --   --   HEPARINUNFRC  --   --   --   --   --  0.51 0.53  CREATININE  --   --  7.37*  --   --   --  4.96*  CKTOTAL  --   --  1,711*  --   --   --   --   TROPONINIHS  --   --  158*  --  173*  --   --    < > = values in this interval not displayed.    Estimated Creatinine Clearance: 8 mL/min (A) (by C-G formula based on SCr of 4.96 mg/dL (H)).   Medical History: Past Medical History:  Diagnosis Date  . Anxiety   . ESRD (end stage renal disease) on dialysis ([place])   . Neuropathy   . Schizophrenia [facility])     Assessment: Patient w/ h/o ESRD on dialysis arrives via EMS after being found down on sidewalk for unknown time w/ pills missing for her pill box. trops were found to be 158, no anticoagulation PTA, baseline CBC above normal past trends. Stat aPTT/INR pending. Patient is being started on heparin drip for anticoagulation management of NSTEMI.  Goal of Therapy:  Heparin level 0.3-0.7 units/ml Monitor platelets by anticoagulation protocol: Yes   Plan:  07/15 @ 0130 HL 0.53 therapeutic. Will continue current rate and will recheck HL w/ am labs, CBC stable will continue to monitor.  Tobie Lords, PharmD, BCPS Clinical  Pharmacist 04/07/2020,2:55 AM

## 2020-04-07 NOTE — Progress Notes (Signed)
[facility]  ROUNDING NOTE   Subjective:  Patient had dialysis yesterday. Still quite confused at the moment. Sitter at the bedside.   Objective:  Vital signs in last 24 hours:  Temp:  [98.1 F (36.7 C)-98.4 F (36.9 C)] 98.2 F (36.8 C) (07/15 1541) Pulse Rate:  [66-87] 87 (07/15 1541) Resp:  [16-19] 19 (07/15 1541) BP: (123-158)/(58-75) 123/58 (07/15 1541) SpO2:  [98 %-100 %] 99 % (07/15 1541) Weight:  [68.9 kg] 68.9 kg (07/15 0632)  Weight change: -15.6 kg Filed Weights   04/05/20 1901 04/06/20 1752 04/07/20 0086  Weight: 71.7 kg 56.1 kg 68.9 kg    Intake/Output: I/O last 3 completed shifts: In: 1794.5 [I.V.:297.2; IV Piggyback:1497.4] Out: 1 [Stool:1]   Intake/Output this shift:  Total I/O In: 963 [P.O.:480; I.V.:483] Out: -   Physical Exam: General: No acute distress  Head: Normocephalic, atraumatic. Moist oral mucosal membranes  Eyes: Anicteric  Neck: Supple, trachea midline  Lungs:  Clear to auscultation, normal effort  Heart: S1S2 no rubs  Abdomen:  Soft, nontender, bowel sounds present  Extremities: 1+peripheral edema.  Neurologic: Awake, alert, following commands, confused  Skin: No lesions  Access: LUE AVF    Basic Metabolic Panel: Recent Labs  Lab 04/05/20 1938 04/06/20 1555 04/07/20 0131  NA 141  --  139  K 4.2  --  4.3  CL 94*  --  97*  CO2 29  --  29  GLUCOSE 90  --  80  BUN 43*  --  26*  CREATININE 7.37*  --  4.96*  CALCIUM 9.7  --  9.5  MG  --   --  1.8  PHOS  --  2.6  --     Liver Function Tests: Recent Labs  Lab 04/05/20 1938  AST 69*  ALT 27  ALKPHOS 88  BILITOT 0.8  PROT 8.3*  ALBUMIN 4.1   No results for input(s): LIPASE, AMYLASE in the last 168 hours. No results for input(s): AMMONIA in the last 168 hours.  CBC: Recent Labs  Lab 04/05/20 1911 04/06/20 1555 04/07/20 0131  WBC 10.7* 5.8 6.7  NEUTROABS  --   --  4.3  HGB 12.8 12.0 11.8*  HCT 37.9 35.9* 35.1*  MCV 95.9 96.8 95.9  PLT 198 200  190    Cardiac Enzymes: Recent Labs  Lab 04/05/20 1938 04/07/20 0753  CKTOTAL 1,711* 2,226*    BNP: Invalid input(s): POCBNP  CBG: No results for input(s): GLUCAP in the last 168 hours.  Microbiology: Results for orders placed or performed during the hospital encounter of 04/05/20  Culture, blood (routine x 2)     Status: None (Preliminary result)   Collection Time: 04/05/20 11:08 PM   Specimen: BLOOD RIGHT FOREARM  Result Value Ref Range Status   Specimen Description BLOOD RIGHT FOREARM  Final   Special Requests   Final    BOTTLES DRAWN AEROBIC ONLY Blood Culture adequate volume   Culture   Final    NO GROWTH 1 DAY Performed at [facility], [address], [place], [place] [address]    Report Status PENDING  Incomplete  MRSA PCR Screening     Status: Abnormal   Collection Time: 04/05/20 11:15 PM   Specimen: Nasopharyngeal  Result Value Ref Range Status   MRSA by PCR POSITIVE (A) NEGATIVE Final    Comment:        The GeneXpert MRSA Assay (FDA approved for NASAL specimens only), is one component of a comprehensive MRSA  colonization surveillance program. It is not intended to diagnose MRSA infection nor to guide or monitor treatment for MRSA infections. RESULT CALLED TO, READ BACK BY AND VERIFIED WITH: LILLY WALKER@[email or web address]  ON 04/07/20 BY HKP Performed at [facility], [address] [place], [place] [address]   SARS Coronavirus 2 by RT PCR (hospital order, performed in [facility] hospital lab) Nasopharyngeal Nasopharyngeal Swab     Status: None   Collection Time: 04/05/20 11:31 PM   Specimen: Nasopharyngeal Swab  Result Value Ref Range Status   SARS Coronavirus 2 NEGATIVE NEGATIVE Final    Comment: (NOTE) SARS-CoV-2 target nucleic acids are NOT DETECTED.  The SARS-CoV-2 RNA is generally detectable in upper and lower respiratory specimens during the acute phase of infection. The lowest concentration of SARS-CoV-2 viral copies this assay can  detect is 250 copies / mL. A negative result does not preclude SARS-CoV-2 infection and should not be used as the sole basis for treatment or other patient management decisions.  A negative result may occur with improper specimen collection / handling, submission of specimen other than nasopharyngeal swab, presence of viral mutation(s) within the areas targeted by this assay, and inadequate number of viral copies (<250 copies / mL). A negative result must be combined with clinical observations, patient history, and epidemiological information.  Fact Sheet for Patients:   StrictlyIdeas.no  Fact Sheet for Healthcare Providers: BankingDealers.co.za  This test is not yet approved or  cleared by the [place] FDA and has been authorized for detection and/or diagnosis of SARS-CoV-2 by FDA under an Emergency Use Authorization (EUA).  This EUA will remain in effect (meaning this test can be used) for the duration of the COVID-19 declaration under Section 564(b)(1) of the Act, 21 U.S.C. section 360bbb-3(b)(1), unless the authorization is terminated or revoked sooner.  Performed at [facility], [address] [place], [place] [address]   Culture, blood (routine x 2)     Status: None (Preliminary result)   Collection Time: 04/06/20 12:27 AM   Specimen: BLOOD RIGHT HAND  Result Value Ref Range Status   Specimen Description BLOOD RIGHT HAND  Final   Special Requests   Final    BOTTLES DRAWN AEROBIC ONLY Blood Culture adequate volume   Culture   Final    NO GROWTH 1 DAY Performed at [facility], [address], [place], [place] [address]    Report Status PENDING  Incomplete    Coagulation Studies: Recent Labs    04/06/20 0146  LABPROT 12.7  INR 1.0    Urinalysis: No results for input(s): COLORURINE, LABSPEC, PHURINE, GLUCOSEU, HGBUR, BILIRUBINUR, KETONESUR, PROTEINUR, UROBILINOGEN, NITRITE, LEUKOCYTESUR in  the last 72 hours.  Invalid input(s): APPERANCEUR    Imaging: DG Chest 1 View  Result Date: 04/05/2020 CLINICAL DATA:  Confusion. EXAM: CHEST  1 VIEW COMPARISON:  12/28/2018 FINDINGS: Low lung volumes. Upper normal heart size with mild aortic tortuosity. Vague heterogeneous opacities in both mid lower lung zones. No pneumothorax or significant pleural effusion. Vascular stent in the left upper arm. Bones are under mineralized. IMPRESSION: Low lung volumes with vague heterogeneous opacities in both mid and lower lung zones, atelectasis versus pneumonia, including atypical viral infection. Electronically Signed   By: Keith Rake M.D.   On: 04/05/2020 23:28   CT Head Wo Contrast  Result Date: 04/06/2020 CLINICAL DATA:  69 year old female with altered mental status. EXAM: CT HEAD WITHOUT CONTRAST TECHNIQUE: Contiguous axial images were obtained from the base of the skull through the  vertex without intravenous contrast. COMPARISON:  Head CT dated 01/03/2019. FINDINGS: Brain: Mild age-related atrophy and chronic microvascular ischemic changes. A 2.0 x 1.8 cm right anterior parafalcine partially calcified meningioma similar to prior CT. No surrounding edema. There is no acute intracranial hemorrhage. No mass effect or midline shift. No extra-axial fluid collection. Vascular: No hyperdense vessel or unexpected calcification. Skull: Normal. Negative for fracture or focal lesion. Sinuses/Orbits: No acute finding. Other: None IMPRESSION: 1. No acute intracranial pathology. 2. Mild age-related atrophy and chronic microvascular ischemic changes. 3. Stable right anterior parafalcine meningioma. No associated edema. Electronically Signed   By: Anner Crete M.D.   On: 04/06/2020 01:29   ECHOCARDIOGRAM COMPLETE  Result Date: 04/06/2020    ECHOCARDIOGRAM REPORT   Patient Name:   CARMILLA GRANVILLE [facility] Date of Exam: 04/06/2020 Medical Rec #:  [id number]       Height:       63.0 in Accession #:    [id number]       Weight:       158.0 lb Date of Birth:  1951-05-25        BSA:          1.749 m Patient Age:    55 years        BP:           120/60 mmHg Patient Gender: F               HR:           75 bpm. Exam Location:  [facility] Procedure: 2D Echo, Cardiac Doppler and Color Doppler Indications:    Syncope 780.2  History:        Patient has no prior history of Echocardiogram examinations.                 ESRD.  Sonographer:    Sherrie Sport RDCS (AE) Referring Phys: [id number] Athena Masse Diagnosing      Kate Sable MD Phys: IMPRESSIONS  1. Left ventricular ejection fraction, by estimation, is 60 to 65%. The left ventricle has normal function. The left ventricle has no regional wall motion abnormalities. Left ventricular diastolic parameters were normal.  2. Right ventricular systolic function is normal. The right ventricular size is normal. There is normal pulmonary artery systolic pressure.  3. Right atrial size was mildly dilated.  4. The mitral valve is normal in structure. Mild mitral valve regurgitation.  5. The aortic valve is normal in structure. Aortic valve regurgitation is not visualized.  6. The inferior vena cava is normal in size with greater than 50% respiratory variability, suggesting right atrial pressure of 3 mmHg. FINDINGS  Left Ventricle: Left ventricular ejection fraction, by estimation, is 60 to 65%. The left ventricle has normal function. The left ventricle has no regional wall motion abnormalities. The left ventricular internal cavity size was normal in size. There is  no left ventricular hypertrophy. Left ventricular diastolic parameters were normal. Right Ventricle: The right ventricular size is normal. No increase in right ventricular wall thickness. Right ventricular systolic function is normal. There is normal pulmonary artery systolic pressure. The tricuspid regurgitant velocity is 2.33 m/s, and  with an assumed right atrial pressure of 3 mmHg, the estimated right ventricular systolic pressure is 09.3  mmHg. Left Atrium: Left atrial size was normal in size. Right Atrium: Right atrial size was mildly dilated. Pericardium: There is no evidence of pericardial effusion. Mitral Valve: The mitral valve is normal in structure. Mild mitral valve regurgitation. Tricuspid Valve:  The tricuspid valve is normal in structure. Tricuspid valve regurgitation is trivial. Aortic Valve: The aortic valve is normal in structure. Aortic valve regurgitation is not visualized. Aortic valve mean gradient measures 4.0 mmHg. Aortic valve peak gradient measures 6.7 mmHg. Aortic valve area, by VTI measures 2.72 cm. Pulmonic Valve: The pulmonic valve was not well visualized. Pulmonic valve regurgitation is not visualized. Aorta: The aortic root is normal in size and structure. Venous: The inferior vena cava is normal in size with greater than 50% respiratory variability, suggesting right atrial pressure of 3 mmHg. IAS/Shunts: No atrial level shunt detected by color flow Doppler.  LEFT VENTRICLE PLAX 2D LVIDd:         3.53 cm  Diastology LVIDs:         2.59 cm  LV e' lateral:   7.18 cm/s LV PW:         1.51 cm  LV E/e' lateral: 12.1 LV IVS:        0.75 cm  LV e' medial:    6.74 cm/s LVOT diam:     2.00 cm  LV E/e' medial:  12.9 LV SV:         77 LV SV Index:   44 LVOT Area:     3.14 cm  RIGHT VENTRICLE RV Basal diam:  3.61 cm LEFT ATRIUM             Index       RIGHT ATRIUM           Index LA diam:        2.30 cm 1.31 cm/m  RA Area:     17.20 cm LA Vol (A2C):   44.8 ml 25.61 ml/m RA Volume:   49.40 ml  28.24 ml/m LA Vol (A4C):   36.7 ml 20.98 ml/m LA Biplane Vol: 41.7 ml 23.84 ml/m  AORTIC VALVE                   PULMONIC VALVE AV Area (Vmax):    2.87 cm    PV Vmax:        0.62 m/s AV Area (Vmean):   2.67 cm    PV Peak grad:   1.5 mmHg AV Area (VTI):     2.72 cm    RVOT Peak grad: 3 mmHg AV Vmax:           129.33 cm/s AV Vmean:          88.300 cm/s AV VTI:            0.284 m AV Peak Grad:      6.7 mmHg AV Mean Grad:      4.0 mmHg LVOT  Vmax:         118.00 cm/s LVOT Vmean:        75.000 cm/s LVOT VTI:          0.246 m LVOT/AV VTI ratio: 0.87  AORTA Ao Root diam: 3.40 cm MITRAL VALVE                TRICUSPID VALVE MV Area (PHT): 2.86 cm     TR Peak grad:   21.7 mmHg MV Decel Time: 265 msec     TR Vmax:        233.00 cm/s MV E velocity: 87.00 cm/s MV A velocity: 101.00 cm/s  SHUNTS MV E/A ratio:  0.86         Systemic VTI:  0.25 m  Systemic Diam: 2.00 cm Kate Sable MD Electronically signed by Kate Sable MD Signature Date/Time: 04/06/2020/10:35:57 AM    Final      Medications:   . sodium chloride    . sodium chloride    . cefTRIAXone (ROCEPHIN)  IV     . aspirin EC  81 mg Oral Daily  . azithromycin  500 mg Oral Daily  . Chlorhexidine Gluconate Cloth  6 each Topical Q0600  . Chlorhexidine Gluconate Cloth  6 each Topical Q0600  . dextromethorphan-guaiFENesin  1 tablet Oral BID  . divalproex  250 mg Oral BID  . docusate sodium  100 mg Oral BID  . heparin injection (subcutaneous)  5,000 Units Subcutaneous Q8H  . metoprolol tartrate  12.5 mg Oral BID  . OLANZapine  15 mg Oral QHS  . pantoprazole  40 mg Oral Daily  . thiamine  50 mg Oral Daily   sodium chloride, sodium chloride, acetaminophen, ALPRAZolam, alteplase, bisacodyl, heparin, lidocaine (PF), lidocaine-prilocaine, nitroGLYCERIN, ondansetron (ZOFRAN) IV, pentafluoroprop-tetrafluoroeth  Assessment/ Plan:  69 y.o. female with past medical history of schizophrenia, bipolar mood disorder, ESRD on HD MWF, anemia of chronic kidney disease, secondary hyperparathyroidism who was found lying on the ground at neighbor's house for an unknown period of time.  Madison [name]  1.  ESRD on HD MWF.  Patient had dialysis yesterday.  No acute indication for dialysis today.  We will plan for hemodialysis again tomorrow.  2.  Anemia of chronic kidney disease.   Lab Results  Component Value Date   HGB 11.8 (L) 04/07/2020  Hold off on Epogen  for now.   3.  Secondary hyperparathyroidism.  Phosphorus 2.6 and acceptable.  4.  Altered mental status.  Likely toxic metabolic encephalopathy.  Work-up as per hospitalist.   LOS: 1 Annelle Behrendt 7/15/20216:22 PM

## 2020-04-08 DIAGNOSIS — Z992 Dependence on renal dialysis: Secondary | ICD-10-CM | POA: Diagnosis not present

## 2020-04-08 DIAGNOSIS — J189 Pneumonia, unspecified organism: Secondary | ICD-10-CM | POA: Diagnosis not present

## 2020-04-08 DIAGNOSIS — I214 Non-ST elevation (NSTEMI) myocardial infarction: Secondary | ICD-10-CM | POA: Diagnosis not present

## 2020-04-08 DIAGNOSIS — F250 Schizoaffective disorder, bipolar type: Secondary | ICD-10-CM | POA: Diagnosis not present

## 2020-04-08 DIAGNOSIS — N186 End stage renal disease: Secondary | ICD-10-CM | POA: Diagnosis not present

## 2020-04-08 LAB — CBC WITH DIFFERENTIAL/PLATELET
Abs Immature Granulocytes: 0.01 K/uL (ref 0.00–0.07)
Basophils Absolute: 0 K/uL (ref 0.0–0.1)
Basophils Relative: 0 %
Eosinophils Absolute: 0.2 K/uL (ref 0.0–0.5)
Eosinophils Relative: 3 %
HCT: 28.8 % — ABNORMAL LOW (ref 36.0–46.0)
Hemoglobin: 9.8 g/dL — ABNORMAL LOW (ref 12.0–15.0)
Immature Granulocytes: 0 %
Lymphocytes Relative: 22 %
Lymphs Abs: 1.2 K/uL (ref 0.7–4.0)
MCH: 32.7 pg (ref 26.0–34.0)
MCHC: 34 g/dL (ref 30.0–36.0)
MCV: 96 fL (ref 80.0–100.0)
Monocytes Absolute: 0.7 K/uL (ref 0.1–1.0)
Monocytes Relative: 13 %
Neutro Abs: 3.3 K/uL (ref 1.7–7.7)
Neutrophils Relative %: 62 %
Platelets: 154 K/uL (ref 150–400)
RBC: 3 MIL/uL — ABNORMAL LOW (ref 3.87–5.11)
RDW: 16.7 % — ABNORMAL HIGH (ref 11.5–15.5)
WBC: 5.3 K/uL (ref 4.0–10.5)
nRBC: 0 % (ref 0.0–0.2)

## 2020-04-08 LAB — IRON AND TIBC
Iron: 49 ug/dL (ref 28–170)
Saturation Ratios: 36 % — ABNORMAL HIGH (ref 10.4–31.8)
TIBC: 137 ug/dL — ABNORMAL LOW (ref 250–450)
UIBC: 88 ug/dL

## 2020-04-08 LAB — RETICULOCYTES
Immature Retic Fract: 9.8 % (ref 2.3–15.9)
RBC.: 3.57 MIL/uL — ABNORMAL LOW (ref 3.87–5.11)
Retic Count, Absolute: 81.4 10*3/uL (ref 19.0–186.0)
Retic Ct Pct: 2.3 % (ref 0.4–3.1)

## 2020-04-08 LAB — BASIC METABOLIC PANEL
Anion gap: 12 (ref 5–15)
BUN: 58 mg/dL — ABNORMAL HIGH (ref 8–23)
CO2: 26 mmol/L (ref 22–32)
Calcium: 8.2 mg/dL — ABNORMAL LOW (ref 8.9–10.3)
Chloride: 99 mmol/L (ref 98–111)
Creatinine, Ser: 7.5 mg/dL — ABNORMAL HIGH (ref 0.44–1.00)
GFR calc Af Amer: 6 mL/min — ABNORMAL LOW (ref >60)
GFR calc non Af Amer: 5 mL/min — ABNORMAL LOW (ref >60)
Glucose, Bld: 88 mg/dL (ref 70–99)
Potassium: 4.2 mmol/L (ref 3.5–5.1)
Sodium: 137 mmol/L (ref 135–145)

## 2020-04-08 LAB — HEPATITIS B SURFACE ANTIGEN: Hepatitis B Surface Ag: NONREACTIVE

## 2020-04-08 LAB — AMMONIA: Ammonia: 26 umol/L (ref 9–35)

## 2020-04-08 LAB — VITAMIN B12: Vitamin B-12: 1077 pg/mL — ABNORMAL HIGH (ref 180–914)

## 2020-04-08 LAB — MAGNESIUM: Magnesium: 1.8 mg/dL (ref 1.7–2.4)

## 2020-04-08 LAB — FOLATE: Folate: 15.9 ng/mL (ref >5.9)

## 2020-04-08 LAB — FERRITIN: Ferritin: 1675 ng/mL — ABNORMAL HIGH (ref 11–307)

## 2020-04-08 LAB — CK: Total CK: 1169 U/L — ABNORMAL HIGH (ref 38–234)

## 2020-04-08 NOTE — Progress Notes (Signed)
OT Cancellation Note  Patient Details Name: Madison [name] MRN: [id number] DOB: April 27, 1951   Cancelled Treatment:    Reason Eval/Treat Not Completed: Patient at procedure or test/ unavailable. Order received and chart reviewed. Pt noted to be off the unit for HD. Will hold until later date/time as available and pt medically appropriate for OT evaluation.   Shara Blazing, M.S., OTR/L Ascom: [phone] 04/08/20, 1:14 PM

## 2020-04-08 NOTE — Progress Notes (Signed)
[facility] Cardiology    SUBJECTIVE: Alert but still very confused answers questions mostly inappropriately denies any chest pain denies any nausea vomiting no palpitations or tachycardia   Vitals:   04/07/20 1911 04/08/20 0109 04/08/20 0436 04/08/20 0716  BP: 139/72 (!) 124/54 (!) 126/57 119/60  Pulse: 86 73 75 73  Resp: 17 16 17 15   Temp: 99 F (37.2 C) 98 F (36.7 C) 97.7 F (36.5 C) 98.1 F (36.7 C)  TempSrc: Oral Oral Oral   SpO2: 100% 99% 98% 96%  Weight:      Height:         Intake/Output Summary (Last 24 hours) at 04/08/2020 6948 Last data filed at 04/08/2020 0600 Gross per 24 hour  Intake 1064.9 ml  Output --  Net 1064.9 ml      PHYSICAL EXAM  General: Well developed, well nourished, in no acute distress HEENT:  Normocephalic and atramatic Neck:  No JVD.  Lungs: Clear bilaterally to auscultation and percussion. Heart: HRRR . Normal S1 and S2 without gallops or murmurs.  Abdomen: Bowel sounds are positive, abdomen soft and non-tender  Msk:  Back normal, normal gait. Normal strength and tone for age. Extremities: No clubbing, cyanosis or edema.   Neuro: Alert and confused . Psych:  Good affect, responds appropriately   LABS: Basic Metabolic Panel: Recent Labs    04/05/20 1938 04/06/20 1555 04/07/20 0131 04/08/20 0433  NA   < >  --  139 137  K   < >  --  4.3 4.2  CL   < >  --  97* 99  CO2   < >  --  29 26  GLUCOSE   < >  --  80 88  BUN   < >  --  26* 58*  CREATININE   < >  --  4.96* 7.50*  CALCIUM   < >  --  9.5 8.2*  MG  --   --  1.8 1.8  PHOS  --  2.6  --   --    < > = values in this interval not displayed.   Liver Function Tests: Recent Labs    04/05/20 1938  AST 69*  ALT 27  ALKPHOS 88  BILITOT 0.8  PROT 8.3*  ALBUMIN 4.1   No results for input(s): LIPASE, AMYLASE in the last 72 hours. CBC: Recent Labs    04/07/20 0131 04/08/20 0433  WBC 6.7 5.3  NEUTROABS 4.3 3.3  HGB 11.8* 9.8*  HCT 35.1* 28.8*  MCV 95.9 96.0  PLT 190 154    Cardiac Enzymes: Recent Labs    04/05/20 1938 04/07/20 0753 04/08/20 0433  CKTOTAL 1,711* 2,226* 1,169*   BNP: Invalid input(s): POCBNP D-Dimer: No results for input(s): DDIMER in the last 72 hours. Hemoglobin A1C: No results for input(s): HGBA1C in the last 72 hours. Fasting Lipid Panel: No results for input(s): CHOL, HDL, LDLCALC, TRIG, CHOLHDL, LDLDIRECT in the last 72 hours. Thyroid Function Tests: No results for input(s): TSH, T4TOTAL, T3FREE, THYROIDAB in the last 72 hours.  Invalid input(s): FREET3 Anemia Panel: No results for input(s): VITAMINB12, FOLATE, FERRITIN, TIBC, IRON, RETICCTPCT in the last 72 hours.  No results found.   Echo normal left ventricular function at 60%  TELEMETRY: Normal sinus rhythm nonspecific ST-T wave changes:  ASSESSMENT AND PLAN:  Principal Problem:   Acute metabolic encephalopathy Active Problems:   ESRD on hemodialysis (HCC)   Schizoaffective disorder, bipolar type (HCC)   Rhabdomyolysis   Elevated troponin  Pneumonia   Lactic acidosis   Suspected Sepsis ([place])   Possible NSTEMI (non-ST elevated myocardial infarction) ([place])    Suspected Overdose, undetermined intent, initial encounter    Plan Relatively flat troponins no evidence of acute coronary syndrome Probable demand ischemia related to end-stage renal disease Recommend continue dialysis as per schedule Encephalopathy unclear etiology agree with neurology input Will consider psychiatric evaluation for possible adjustments in medication Do not recommend any further direct cardiac interventions to modify the patient's condition We will sign off today there is no further cardiac issues that need to be addressed Heparin only for DVT prophylaxis not acute coronary syndrome Consider follow-up with cardiology as an outpatient   Yolonda Kida, MD 04/08/2020 9:03 AM

## 2020-04-08 NOTE — Progress Notes (Signed)
Called to give report to transfer patient. Floor RN refused report stating they are under "Protected Time" at [place] and report would need to be called to the night shift RN. Patient is packed up and ready to be transferred at this time. Tele sitter still in place and monitoring at bedside. Mats are in place at bedside.

## 2020-04-08 NOTE — Progress Notes (Addendum)
PROGRESS NOTE    Madison [name]   QPR:[id number]  DOB: 02/19/1951  PCP: Vidal Schwalbe, MD    DOA: 04/05/2020 LOS: 2   Brief Narrative   Madison [name] is a 69 y.o. female with medical history significant for schizoaffective disorder bipolar type, ESRD on HD MWF last dialysis 04/04/2020, with no missed dialysis sessions who was brought in after she was found lying on the ground at the neighbors house for an unknown period of time and with empty pill packs found by patient's sister but unknown if patient overdosed.  Patient later admitted to taking all the pills she could find because she was "tired of it all".  Sister also reported recent increased confusion with the patient, but no recent illnesses, fevers/chills or complaints of other symptoms.  In the ED, patient awake but confused.  Normal vitals.  EKG normal sinus rhythm and no acute ST-T wave changes.  Pertinent labs include WBC 10.7k, CK 1711, troponin 158>>173.  Lactic acid 2.8 >> 2.9.  Normal depakote level.  CXR showed mid and lower lung zones opacities concerning for pneumonia including atypical viral infection vs atelectasis.  Covid-19 PCR negative.  She was fluid resuscitated and treated with broad spectrum antibiotics in the ED per sepsis protocol.  Admitted to hospitalist service.  Cardiology consulted.      Assessment & Plan   Principal Problem:   Acute metabolic encephalopathy Active Problems:   ESRD on hemodialysis (HCC)   Schizoaffective disorder, bipolar type (HCC)   Rhabdomyolysis   Elevated troponin   Pneumonia   Lactic acidosis   Suspected Sepsis ([place])   Possible NSTEMI (non-ST elevated myocardial infarction) ([place])    Suspected Overdose, undetermined intent, initial encounter   Acute metabolic encephalopathy - POA, persistent, still disoriented to place at times.  Patient was found with empty pill packets and admitted to attempted overdose.  Unable to say what she took.  Continue neuro checks, aspiration  precautions.  Monitor.  CT head negative for acute findings.  Her baseline is unclear at this point, will attempt to reach family.  Check ammonia, thiamine and B12 levels.  MRI brain without contrast.  Consider neurology consult.  Suicide attempt by intentional overdose- continue 1:1 sitter.  Psychiatry is consulted, recommendations pending.  Sepsis due to Community-acquired PNA / Lactic acidosis - sepsis POA with elevated lactate, mild leukocytosis, but stable vitals.  Fluid resuscitated and treated with broad-spectrum antibiotics in the ED.  Lactic acidosis resolved. Continue with Rocephin and Zithromax.  Scheduled Mucinex.  Follow-up on cultures.  Acute anemia - not present on admission.  Initial Hbg 12.8 >>12.0>>11.8>>9.8.  Dropped two points in past 24 hours.  No evidence of bleeding.  Anemia panel pending.  Check FOBT.  Monitor for bleeding.  Daily CBC.  Transfuse if Hbg < 7.0 (with dialysis).  Rhabdomyolysis, acute, nontraumatic - POA with CK over 1500 >>2226>>1169. Trend CK with AM labs.  Dialysis today.  Unable to give IV fluids in dialysis patient, will overload her.  Elevated troponin - POA with troponin's 158 >> 173 >> 54 this AM.  Cardiology consulted.  Feels T wave changes were related to HTN urgency and troponin elevation due to demand ischemia.  Patient free of chest pain.  Heparin drip stopped.   Continue ASA, stain, beta blocker.  ESRD on hemodialysis M/W/F - had not missed any sessions except day of admission.  Nephrology consulted for dialysis.  Schizoaffective disorder, bipolar type - Depakote level within normal limits on admission.  Psychiatry  is consulted given suicide attempt.  Continue Zyprexa, Depakote.  GERD - continue Protonix  Neuropathy - hold gabapentin given AMS  DVT prophylaxis: heparin injection 5,000 Units Start: 04/07/20 2200   Diet:  Diet Orders (From admission, onward)    Start     Ordered   04/06/20 0216  Diet Heart Room service appropriate? Yes;  Fluid consistency: Thin  Diet effective now       Question Answer Comment  Room service appropriate? Yes   Fluid consistency: Thin      04/06/20 0219            Code Status: Full Code    Subjective 04/08/20    Patient seen with sitter at bedside this AM.  She is still confused today talking nonsensical things.  Sitter reports that she was attempting to get out of bed earlier saying she needed to go home.  Patient says she did not sleep last night due to hearing other people talking and TVs from other rooms.  She is currently not answering questions regarding any other symptoms.  She continually talks about needing to go home.   Disposition Plan & Communication   Status is: Inpatient  Remains inpatient appropriate because:Inpatient level of care appropriate due to severity of illness   Dispo: The patient is from: Home              Anticipated d/c is to: Home              Anticipated d/c date is: 2 days              Patient currently is not medically stable to d/c.    Family Communication: unable to reach patient's sister by phone this afternoon.  Will try again tomorrow.    Consults, Procedures, Significant Events   Consultants:   Cardiology  Nephrology  Procedures:   Dialysis  Antimicrobials:   Rocephin and Zithromax 7/15 >>  Vancomycin, Cefepime, Flagyl 7/14 >> 7/15     Objective   Vitals:   04/07/20 1911 04/08/20 0109 04/08/20 0436 04/08/20 0716  BP: 139/72 (!) 124/54 (!) 126/57 119/60  Pulse: 86 73 75 73  Resp: 17 16 17 15   Temp: 99 F (37.2 C) 98 F (36.7 C) 97.7 F (36.5 C) 98.1 F (36.7 C)  TempSrc: Oral Oral Oral   SpO2: 100% 99% 98% 96%  Weight:      Height:        Intake/Output Summary (Last 24 hours) at 04/08/2020 0734 Last data filed at 04/08/2020 0600 Gross per 24 hour  Intake 1064.9 ml  Output --  Net 1064.9 ml   Filed Weights   04/05/20 1901 04/06/20 1752 04/07/20 0632  Weight: 71.7 kg 56.1 kg 68.9 kg    Physical  Exam:  General exam: awake and alert, no acute distress HEENT: moist mucus membranes, hearing grossly normal  Respiratory system: diminished bases but overall clear, no wheezes or rhonchi, normal respiratory effort. Cardiovascular system: normal S1/S2, RRR, no pedal edema.   Central nervous system: A&O x2 (person and time). no focal neurologic deficits, normal speech Extremities: moves all, no edema, normal tone Skin: diaphoretic forehead, intact, normal temperature, normal color Psychiatry: normal mood, congruent affect, nonsensical speech content, perseverates on going home  Labs   Data Reviewed: I have personally reviewed following labs and imaging studies  CBC: Recent Labs  Lab 04/05/20 1911 04/06/20 1555 04/07/20 0131 04/08/20 0433  WBC 10.7* 5.8 6.7 5.3  NEUTROABS  --   --  4.3 3.3  HGB 12.8 12.0 11.8* 9.8*  HCT 37.9 35.9* 35.1* 28.8*  MCV 95.9 96.8 95.9 96.0  PLT 198 200 190 536   Basic Metabolic Panel: Recent Labs  Lab 04/05/20 1938 04/06/20 1555 04/07/20 0131 04/08/20 0433  NA 141  --  139 137  K 4.2  --  4.3 4.2  CL 94*  --  97* 99  CO2 29  --  29 26  GLUCOSE 90  --  80 88  BUN 43*  --  26* 58*  CREATININE 7.37*  --  4.96* 7.50*  CALCIUM 9.7  --  9.5 8.2*  MG  --   --  1.8 1.8  PHOS  --  2.6  --   --    GFR: Estimated Creatinine Clearance: 6.2 mL/min (A) (by C-G formula based on SCr of 7.5 mg/dL (H)). Liver Function Tests: Recent Labs  Lab 04/05/20 1938  AST 69*  ALT 27  ALKPHOS 88  BILITOT 0.8  PROT 8.3*  ALBUMIN 4.1   No results for input(s): LIPASE, AMYLASE in the last 168 hours. No results for input(s): AMMONIA in the last 168 hours. Coagulation Profile: Recent Labs  Lab 04/06/20 0146  INR 1.0   Cardiac Enzymes: Recent Labs  Lab 04/05/20 1938 04/07/20 0753 04/08/20 0433  CKTOTAL 1,711* 2,226* 1,169*   BNP (last 3 results) No results for input(s): PROBNP in the last 8760 hours. HbA1C: No results for input(s): HGBA1C in the  last 72 hours. CBG: No results for input(s): GLUCAP in the last 168 hours. Lipid Profile: No results for input(s): CHOL, HDL, LDLCALC, TRIG, CHOLHDL, LDLDIRECT in the last 72 hours. Thyroid Function Tests: No results for input(s): TSH, T4TOTAL, FREET4, T3FREE, THYROIDAB in the last 72 hours. Anemia Panel: No results for input(s): VITAMINB12, FOLATE, FERRITIN, TIBC, IRON, RETICCTPCT in the last 72 hours. Sepsis Labs: Recent Labs  Lab 04/06/20 0027 04/06/20 0227 04/07/20 0753  LATICACIDVEN 2.8* 2.9* 1.4    Recent Results (from the past 240 hour(s))  Culture, blood (routine x 2)     Status: None (Preliminary result)   Collection Time: 04/05/20 11:08 PM   Specimen: BLOOD RIGHT FOREARM  Result Value Ref Range Status   Specimen Description BLOOD RIGHT FOREARM  Final   Special Requests   Final    BOTTLES DRAWN AEROBIC ONLY Blood Culture adequate volume   Culture   Final    NO GROWTH 2 DAYS Performed at [facility], [address] [place], [place] [address]    Report Status PENDING  Incomplete  MRSA PCR Screening     Status: Abnormal   Collection Time: 04/05/20 11:15 PM   Specimen: Nasopharyngeal  Result Value Ref Range Status   MRSA by PCR POSITIVE (A) NEGATIVE Final    Comment:        The GeneXpert MRSA Assay (FDA approved for NASAL specimens only), is one component of a comprehensive MRSA colonization surveillance program. It is not intended to diagnose MRSA infection nor to guide or monitor treatment for MRSA infections. RESULT CALLED TO, READ BACK BY AND VERIFIED WITH: LILLY [email or web address]  ON 04/07/20 BY HKP Performed at [facility], [place], [place] [address]   SARS Coronavirus 2 by RT PCR (hospital order, performed in [facility] hospital lab) Nasopharyngeal Nasopharyngeal Swab     Status: None   Collection Time: 04/05/20 11:31 PM   Specimen: Nasopharyngeal Swab  Result Value Ref Range Status   SARS Coronavirus 2 NEGATIVE  NEGATIVE  Final    Comment: (NOTE) SARS-CoV-2 target nucleic acids are NOT DETECTED.  The SARS-CoV-2 RNA is generally detectable in upper and lower respiratory specimens during the acute phase of infection. The lowest concentration of SARS-CoV-2 viral copies this assay can detect is 250 copies / mL. A negative result does not preclude SARS-CoV-2 infection and should not be used as the sole basis for treatment or other patient management decisions.  A negative result may occur with improper specimen collection / handling, submission of specimen other than nasopharyngeal swab, presence of viral mutation(s) within the areas targeted by this assay, and inadequate number of viral copies (<250 copies / mL). A negative result must be combined with clinical observations, patient history, and epidemiological information.  Fact Sheet for Patients:   StrictlyIdeas.no  Fact Sheet for Healthcare Providers: BankingDealers.co.za  This test is not yet approved or  cleared by the [place] FDA and has been authorized for detection and/or diagnosis of SARS-CoV-2 by FDA under an Emergency Use Authorization (EUA).  This EUA will remain in effect (meaning this test can be used) for the duration of the COVID-19 declaration under Section 564(b)(1) of the Act, 21 U.S.C. section 360bbb-3(b)(1), unless the authorization is terminated or revoked sooner.  Performed at [facility], [address] [place], [place] [address]   Culture, blood (routine x 2)     Status: None (Preliminary result)   Collection Time: 04/06/20 12:27 AM   Specimen: BLOOD RIGHT HAND  Result Value Ref Range Status   Specimen Description BLOOD RIGHT HAND  Final   Special Requests   Final    BOTTLES DRAWN AEROBIC ONLY Blood Culture adequate volume   Culture   Final    NO GROWTH 2 DAYS Performed at [facility], [address], [place], [place] [address]     Report Status PENDING  Incomplete      Imaging Studies   ECHOCARDIOGRAM COMPLETE  Result Date: 04/06/2020    ECHOCARDIOGRAM REPORT   Patient Name:   Madison [name] [facility] Date of Exam: 04/06/2020 Medical Rec #:  [id number]       Height:       63.0 in Accession #:    [id number]      Weight:       158.0 lb Date of Birth:  1951/01/26        BSA:          1.749 m Patient Age:    30 years        BP:           120/60 mmHg Patient Gender: F               HR:           75 bpm. Exam Location:  [facility] Procedure: 2D Echo, Cardiac Doppler and Color Doppler Indications:    Syncope 780.2  History:        Patient has no prior history of Echocardiogram examinations.                 ESRD.  Sonographer:    Sherrie Sport RDCS (AE) Referring Phys: [id number] Athena Masse Diagnosing      Kate Sable MD Phys: IMPRESSIONS  1. Left ventricular ejection fraction, by estimation, is 60 to 65%. The left ventricle has normal function. The left ventricle has no regional wall motion abnormalities. Left ventricular diastolic parameters were normal.  2. Right ventricular systolic function is normal. The right ventricular size is normal. There  is normal pulmonary artery systolic pressure.  3. Right atrial size was mildly dilated.  4. The mitral valve is normal in structure. Mild mitral valve regurgitation.  5. The aortic valve is normal in structure. Aortic valve regurgitation is not visualized.  6. The inferior vena cava is normal in size with greater than 50% respiratory variability, suggesting right atrial pressure of 3 mmHg. FINDINGS  Left Ventricle: Left ventricular ejection fraction, by estimation, is 60 to 65%. The left ventricle has normal function. The left ventricle has no regional wall motion abnormalities. The left ventricular internal cavity size was normal in size. There is  no left ventricular hypertrophy. Left ventricular diastolic parameters were normal. Right Ventricle: The right ventricular size is normal. No increase in right  ventricular wall thickness. Right ventricular systolic function is normal. There is normal pulmonary artery systolic pressure. The tricuspid regurgitant velocity is 2.33 m/s, and  with an assumed right atrial pressure of 3 mmHg, the estimated right ventricular systolic pressure is 62.3 mmHg. Left Atrium: Left atrial size was normal in size. Right Atrium: Right atrial size was mildly dilated. Pericardium: There is no evidence of pericardial effusion. Mitral Valve: The mitral valve is normal in structure. Mild mitral valve regurgitation. Tricuspid Valve: The tricuspid valve is normal in structure. Tricuspid valve regurgitation is trivial. Aortic Valve: The aortic valve is normal in structure. Aortic valve regurgitation is not visualized. Aortic valve mean gradient measures 4.0 mmHg. Aortic valve peak gradient measures 6.7 mmHg. Aortic valve area, by VTI measures 2.72 cm. Pulmonic Valve: The pulmonic valve was not well visualized. Pulmonic valve regurgitation is not visualized. Aorta: The aortic root is normal in size and structure. Venous: The inferior vena cava is normal in size with greater than 50% respiratory variability, suggesting right atrial pressure of 3 mmHg. IAS/Shunts: No atrial level shunt detected by color flow Doppler.  LEFT VENTRICLE PLAX 2D LVIDd:         3.53 cm  Diastology LVIDs:         2.59 cm  LV e' lateral:   7.18 cm/s LV PW:         1.51 cm  LV E/e' lateral: 12.1 LV IVS:        0.75 cm  LV e' medial:    6.74 cm/s LVOT diam:     2.00 cm  LV E/e' medial:  12.9 LV SV:         77 LV SV Index:   44 LVOT Area:     3.14 cm  RIGHT VENTRICLE RV Basal diam:  3.61 cm LEFT ATRIUM             Index       RIGHT ATRIUM           Index LA diam:        2.30 cm 1.31 cm/m  RA Area:     17.20 cm LA Vol (A2C):   44.8 ml 25.61 ml/m RA Volume:   49.40 ml  28.24 ml/m LA Vol (A4C):   36.7 ml 20.98 ml/m LA Biplane Vol: 41.7 ml 23.84 ml/m  AORTIC VALVE                   PULMONIC VALVE AV Area (Vmax):    2.87 cm     PV Vmax:        0.62 m/s AV Area (Vmean):   2.67 cm    PV Peak grad:   1.5 mmHg AV Area (VTI):  2.72 cm    RVOT Peak grad: 3 mmHg AV Vmax:           129.33 cm/s AV Vmean:          88.300 cm/s AV VTI:            0.284 m AV Peak Grad:      6.7 mmHg AV Mean Grad:      4.0 mmHg LVOT Vmax:         118.00 cm/s LVOT Vmean:        75.000 cm/s LVOT VTI:          0.246 m LVOT/AV VTI ratio: 0.87  AORTA Ao Root diam: 3.40 cm MITRAL VALVE                TRICUSPID VALVE MV Area (PHT): 2.86 cm     TR Peak grad:   21.7 mmHg MV Decel Time: 265 msec     TR Vmax:        233.00 cm/s MV E velocity: 87.00 cm/s MV A velocity: 101.00 cm/s  SHUNTS MV E/A ratio:  0.86         Systemic VTI:  0.25 m                             Systemic Diam: 2.00 cm Kate Sable MD Electronically signed by Kate Sable MD Signature Date/Time: 04/06/2020/10:35:57 AM    Final      Medications   Scheduled Meds: . aspirin EC  81 mg Oral Daily  . azithromycin  500 mg Oral Daily  . Chlorhexidine Gluconate Cloth  6 each Topical Q0600  . Chlorhexidine Gluconate Cloth  6 each Topical Q0600  . dextromethorphan-guaiFENesin  1 tablet Oral BID  . divalproex  250 mg Oral BID  . docusate sodium  100 mg Oral BID  . heparin injection (subcutaneous)  5,000 Units Subcutaneous Q8H  . metoprolol tartrate  12.5 mg Oral BID  . OLANZapine  15 mg Oral QHS  . pantoprazole  40 mg Oral Daily  . thiamine  50 mg Oral Daily   Continuous Infusions: . sodium chloride    . sodium chloride    . cefTRIAXone (ROCEPHIN)  IV Stopped (04/07/20 2232)       LOS: 2 days    Time spent: 25 minutes    Ezekiel Slocumb, DO [facility]  04/08/2020, 7:34 AM    If 7PM-7AM, please contact night-coverage. How to contact the [facility] Attending or Consulting provider [place] or covering provider during after hours [place], for this patient?    1. Check the care team in [facility] and look for a) attending/consulting [facility] provider listed and b) the [facility] team  listed 2. Log into [email or web address] and use [place]'s universal password to access. If you do not have the password, please contact the hospital operator. 3. Locate the [facility] provider you are looking for under [facility] and page to a number that you can be directly reached. 4. If you still have difficulty reaching the provider, please page the [facility] (Director on Call) for the Hospitalists listed on amion for assistance.

## 2020-04-08 NOTE — Progress Notes (Signed)
[facility]  ROUNDING NOTE   Subjective:  Patient seen and evaluated during hemodialysis. Still confused. Tolerating dialysis treatment well.   Objective:  Vital signs in last 24 hours:  Temp:  [97.7 F (36.5 C)-99 F (37.2 C)] 98.2 F (36.8 C) (07/16 0945) Pulse Rate:  [71-87] 71 (07/16 0945) Resp:  [15-19] 15 (07/16 0716) BP: (114-139)/(54-72) 114/61 (07/16 1015) SpO2:  [96 %-100 %] 99 % (07/16 0945)  Weight change:  Filed Weights   04/05/20 1901 04/06/20 1752 04/07/20 0632  Weight: 71.7 kg 56.1 kg 68.9 kg    Intake/Output: I/O last 3 completed shifts: In: 1659.4 [P.O.:480; I.V.:780.1; IV Piggyback:399.3] Out: 1 [Stool:1]   Intake/Output this shift:  No intake/output data recorded.  Physical Exam: General: No acute distress  Head: Normocephalic, atraumatic. Moist oral mucosal membranes  Eyes: Anicteric  Neck: Supple, trachea midline  Lungs:  Clear to auscultation, normal effort  Heart: S1S2 no rubs  Abdomen:  Soft, nontender, bowel sounds present  Extremities: Trace peripheral edema.  Neurologic: Awake, alert, following commands, confused  Skin: No lesions  Access: LUE AVF    Basic Metabolic Panel: Recent Labs  Lab 04/05/20 1938 04/06/20 1555 04/07/20 0131 04/08/20 0433  NA 141  --  139 137  K 4.2  --  4.3 4.2  CL 94*  --  97* 99  CO2 29  --  29 26  GLUCOSE 90  --  80 88  BUN 43*  --  26* 58*  CREATININE 7.37*  --  4.96* 7.50*  CALCIUM 9.7  --  9.5 8.2*  MG  --   --  1.8 1.8  PHOS  --  2.6  --   --     Liver Function Tests: Recent Labs  Lab 04/05/20 1938  AST 69*  ALT 27  ALKPHOS 88  BILITOT 0.8  PROT 8.3*  ALBUMIN 4.1   No results for input(s): LIPASE, AMYLASE in the last 168 hours. No results for input(s): AMMONIA in the last 168 hours.  CBC: Recent Labs  Lab 04/05/20 1911 04/06/20 1555 04/07/20 0131 04/08/20 0433  WBC 10.7* 5.8 6.7 5.3  NEUTROABS  --   --  4.3 3.3  HGB 12.8 12.0 11.8* 9.8*  HCT 37.9 35.9*  35.1* 28.8*  MCV 95.9 96.8 95.9 96.0  PLT 198 200 190 154    Cardiac Enzymes: Recent Labs  Lab 04/05/20 1938 04/07/20 0753 04/08/20 0433  CKTOTAL 1,711* 2,226* 1,169*    BNP: Invalid input(s): POCBNP  CBG: No results for input(s): GLUCAP in the last 168 hours.  Microbiology: Results for orders placed or performed during the hospital encounter of 04/05/20  Culture, blood (routine x 2)     Status: None (Preliminary result)   Collection Time: 04/05/20 11:08 PM   Specimen: BLOOD RIGHT FOREARM  Result Value Ref Range Status   Specimen Description BLOOD RIGHT FOREARM  Final   Special Requests   Final    BOTTLES DRAWN AEROBIC ONLY Blood Culture adequate volume   Culture   Final    NO GROWTH 2 DAYS Performed at [facility], [address] [place], [place] [address]    Report Status PENDING  Incomplete  MRSA PCR Screening     Status: Abnormal   Collection Time: 04/05/20 11:15 PM   Specimen: Nasopharyngeal  Result Value Ref Range Status   MRSA by PCR POSITIVE (A) NEGATIVE Final    Comment:        The GeneXpert MRSA Assay (FDA approved  for NASAL specimens only), is one component of a comprehensive MRSA colonization surveillance program. It is not intended to diagnose MRSA infection nor to guide or monitor treatment for MRSA infections. RESULT CALLED TO, READ BACK BY AND VERIFIED WITH: LILLY WALKER@1320  ON 04/07/20 BY HKP Performed at [facility], [address] [place], [place] [address]   SARS Coronavirus 2 by RT PCR (hospital order, performed in [facility] hospital lab) Nasopharyngeal Nasopharyngeal Swab     Status: None   Collection Time: 04/05/20 11:31 PM   Specimen: Nasopharyngeal Swab  Result Value Ref Range Status   SARS Coronavirus 2 NEGATIVE NEGATIVE Final    Comment: (NOTE) SARS-CoV-2 target nucleic acids are NOT DETECTED.  The SARS-CoV-2 RNA is generally detectable in upper and lower respiratory specimens during the acute phase of  infection. The lowest concentration of SARS-CoV-2 viral copies this assay can detect is 250 copies / mL. A negative result does not preclude SARS-CoV-2 infection and should not be used as the sole basis for treatment or other patient management decisions.  A negative result may occur with improper specimen collection / handling, submission of specimen other than nasopharyngeal swab, presence of viral mutation(s) within the areas targeted by this assay, and inadequate number of viral copies (<250 copies / mL). A negative result must be combined with clinical observations, patient history, and epidemiological information.  Fact Sheet for Patients:   StrictlyIdeas.no  Fact Sheet for Healthcare Providers: BankingDealers.co.za  This test is not yet approved or  cleared by the [place] FDA and has been authorized for detection and/or diagnosis of SARS-CoV-2 by FDA under an Emergency Use Authorization (EUA).  This EUA will remain in effect (meaning this test can be used) for the duration of the COVID-19 declaration under Section 564(b)(1) of the Act, 21 U.S.C. section 360bbb-3(b)(1), unless the authorization is terminated or revoked sooner.  Performed at [facility], [address] [place], [place] [address]   Culture, blood (routine x 2)     Status: None (Preliminary result)   Collection Time: 04/06/20 12:27 AM   Specimen: BLOOD RIGHT HAND  Result Value Ref Range Status   Specimen Description BLOOD RIGHT HAND  Final   Special Requests   Final    BOTTLES DRAWN AEROBIC ONLY Blood Culture adequate volume   Culture   Final    NO GROWTH 2 DAYS Performed at [facility], [address], [place], [place] [address]    Report Status PENDING  Incomplete    Coagulation Studies: Recent Labs    04/06/20 0146  LABPROT 12.7  INR 1.0    Urinalysis: No results for input(s): COLORURINE, LABSPEC, PHURINE, GLUCOSEU,  HGBUR, BILIRUBINUR, KETONESUR, PROTEINUR, UROBILINOGEN, NITRITE, LEUKOCYTESUR in the last 72 hours.  Invalid input(s): APPERANCEUR    Imaging: No results found.   Medications:   . sodium chloride    . sodium chloride    . cefTRIAXone (ROCEPHIN)  IV Stopped (04/07/20 2232)   . aspirin EC  81 mg Oral Daily  . azithromycin  500 mg Oral Daily  . Chlorhexidine Gluconate Cloth  6 each Topical Q0600  . Chlorhexidine Gluconate Cloth  6 each Topical Q0600  . dextromethorphan-guaiFENesin  1 tablet Oral BID  . divalproex  250 mg Oral BID  . docusate sodium  100 mg Oral BID  . heparin injection (subcutaneous)  5,000 Units Subcutaneous Q8H  . metoprolol tartrate  12.5 mg Oral BID  . OLANZapine  15 mg Oral QHS  . pantoprazole  40 mg Oral Daily  . thiamine  50 mg Oral Daily   sodium chloride, sodium chloride, acetaminophen, ALPRAZolam, alteplase, bisacodyl, heparin, lidocaine (PF), lidocaine-prilocaine, nitroGLYCERIN, ondansetron (ZOFRAN) IV, pentafluoroprop-tetrafluoroeth  Assessment/ Plan:  69 y.o. female with past medical history of schizophrenia, bipolar mood disorder, ESRD on HD MWF, anemia of chronic kidney disease, secondary hyperparathyroidism who was found lying on the ground at neighbor's house for an unknown period of time.  [name]/MWF  1.  ESRD on HD MWF.  Patient seen during dialysis treatment.  Tolerating well.  We plan to complete dialysis treatment today.  2.  Anemia of chronic kidney disease.   Lab Results  Component Value Date   HGB 9.8 (L) 04/08/2020  Hemoglobin currently 9.8.  Hold off on Epogen for now.  Restart as an outpatient.   3.  Secondary hyperparathyroidism.  Phosphorus 2.6 at last check.  Continue to periodically monitor..  4.  Altered mental status.  Likely toxic metabolic encephalopathy.  CK coming down to 1169.  Further work-up per hospitalist.   LOS: 2 Cenia Zaragosa 7/16/202111:06 AM

## 2020-04-08 NOTE — Progress Notes (Signed)
PT Cancellation Note  Patient Details Name: KEMPER HOCHMAN MRN: [id number] DOB: Apr 04, 1951   Cancelled Treatment:    Reason Eval/Treat Not Completed: Other (comment). Pt currently off unit for HD. Will re-attempt tomorrow.   Shalese Strahan 04/08/2020, 11:12 AM  Greggory Stallion, PT, DPT [phone]

## 2020-04-08 NOTE — Progress Notes (Addendum)
Report called to [facility] RN. Patient has telesitter monitor, and this will be sent with patient to 1C unit, room 117. Patient informed of plan to transfer.

## 2020-04-09 ENCOUNTER — Inpatient Hospital Stay: Payer: Medicare Other

## 2020-04-09 DIAGNOSIS — G9341 Metabolic encephalopathy: Secondary | ICD-10-CM | POA: Diagnosis not present

## 2020-04-09 DIAGNOSIS — T50904A Poisoning by unspecified drugs, medicaments and biological substances, undetermined, initial encounter: Secondary | ICD-10-CM | POA: Diagnosis not present

## 2020-04-09 DIAGNOSIS — J189 Pneumonia, unspecified organism: Secondary | ICD-10-CM | POA: Diagnosis not present

## 2020-04-09 DIAGNOSIS — I214 Non-ST elevation (NSTEMI) myocardial infarction: Secondary | ICD-10-CM | POA: Diagnosis not present

## 2020-04-09 DIAGNOSIS — N186 End stage renal disease: Secondary | ICD-10-CM | POA: Diagnosis not present

## 2020-04-09 DIAGNOSIS — Z992 Dependence on renal dialysis: Secondary | ICD-10-CM | POA: Diagnosis not present

## 2020-04-09 LAB — CBC WITH DIFFERENTIAL/PLATELET
Abs Immature Granulocytes: 0.02 10*3/uL (ref 0.00–0.07)
Basophils Absolute: 0 10*3/uL (ref 0.0–0.1)
Basophils Relative: 1 %
Eosinophils Absolute: 0.2 10*3/uL (ref 0.0–0.5)
Eosinophils Relative: 3 %
HCT: 31.7 % — ABNORMAL LOW (ref 36.0–46.0)
Hemoglobin: 10.3 g/dL — ABNORMAL LOW (ref 12.0–15.0)
Immature Granulocytes: 0 %
Lymphocytes Relative: 22 %
Lymphs Abs: 1.2 10*3/uL (ref 0.7–4.0)
MCH: 32.4 pg (ref 26.0–34.0)
MCHC: 32.5 g/dL (ref 30.0–36.0)
MCV: 99.7 fL (ref 80.0–100.0)
Monocytes Absolute: 0.8 10*3/uL (ref 0.1–1.0)
Monocytes Relative: 14 %
Neutro Abs: 3.3 10*3/uL (ref 1.7–7.7)
Neutrophils Relative %: 60 %
Platelets: 146 10*3/uL — ABNORMAL LOW (ref 150–400)
RBC: 3.18 MIL/uL — ABNORMAL LOW (ref 3.87–5.11)
RDW: 16.7 % — ABNORMAL HIGH (ref 11.5–15.5)
WBC: 5.4 10*3/uL (ref 4.0–10.5)
nRBC: 0 % (ref 0.0–0.2)

## 2020-04-09 LAB — BASIC METABOLIC PANEL
Anion gap: 12 (ref 5–15)
BUN: 47 mg/dL — ABNORMAL HIGH (ref 8–23)
CO2: 28 mmol/L (ref 22–32)
Calcium: 8.9 mg/dL (ref 8.9–10.3)
Chloride: 95 mmol/L — ABNORMAL LOW (ref 98–111)
Creatinine, Ser: 6.17 mg/dL — ABNORMAL HIGH (ref 0.44–1.00)
GFR calc Af Amer: 7 mL/min — ABNORMAL LOW (ref >60)
GFR calc non Af Amer: 6 mL/min — ABNORMAL LOW (ref >60)
Glucose, Bld: 95 mg/dL (ref 70–99)
Potassium: 4.9 mmol/L (ref 3.5–5.1)
Sodium: 135 mmol/L (ref 135–145)

## 2020-04-09 LAB — MAGNESIUM: Magnesium: 1.7 mg/dL (ref 1.7–2.4)

## 2020-04-09 LAB — HEPATITIS B SURFACE ANTIBODY, QUANTITATIVE: Hep B S AB Quant (Post): 34.3 m[IU]/mL (ref >9.9)

## 2020-04-09 MED ORDER — DULOXETINE HCL 20 MG PO CPEP
20.0000 mg | ORAL_CAPSULE | Freq: Every day | ORAL | Status: DC
  Administered 2020-04-09 – 2020-04-18 (×10): 20 mg via ORAL
  Filled 2020-04-09 (×10): qty 1

## 2020-04-09 NOTE — Progress Notes (Signed)
PROGRESS NOTE    Madison [name]   GKK:[id number]  DOB: Dec 20, 1950  PCP: Vidal Schwalbe, MD    DOA: 04/05/2020 LOS: 3   Brief Narrative   Madison [name] is a 69 y.o. female with medical history significant for schizoaffective disorder bipolar type, ESRD on HD MWF last dialysis 04/04/2020, with no missed dialysis sessions who was brought in after she was found lying on the ground at the neighbors house for an unknown period of time and with empty pill packs found by patient's sister but unknown if patient overdosed.  Patient later admitted to taking all the pills she could find because she was "tired of it all".  Sister also reported recent increased confusion with the patient, but no recent illnesses, fevers/chills or complaints of other symptoms.  In the ED, patient awake but confused.  Normal vitals.  EKG normal sinus rhythm and no acute ST-T wave changes.  Pertinent labs include WBC 10.7k, CK 1711, troponin 158>>173.  Lactic acid 2.8 >> 2.9.  Normal depakote level.  CXR showed mid and lower lung zones opacities concerning for pneumonia including atypical viral infection vs atelectasis.  Covid-19 PCR negative.  She was fluid resuscitated and treated with broad spectrum antibiotics in the ED per sepsis protocol.  Admitted to hospitalist service.  Cardiology consulted.      Assessment & Plan   Principal Problem:   Acute metabolic encephalopathy Active Problems:   ESRD on hemodialysis (HCC)   Schizoaffective disorder, bipolar type (HCC)   Rhabdomyolysis   Elevated troponin   Pneumonia   Lactic acidosis   Suspected Sepsis ([place])   Possible NSTEMI (non-ST elevated myocardial infarction) ([place])    Suspected Overdose, undetermined intent, initial encounter   Acute metabolic encephalopathy - POA, persistent, still disoriented to place at times.  Patient was found with empty pill packets and admitted to attempted overdose.  Unable to say what she took.  Continue neuro checks, aspiration  precautions.  Monitor.  CT head negative for acute findings.  Her baseline is unclear at this point, will attempt to reach family.  Ammonia normal.  B12 high.  Thiamine level pending.  MRI brain without contrast.  Neurology consulted.  Suicide attempt by intentional overdose- continue 1:1 sitter.  Psychiatry is consulted, recommendations pending.  Sepsis due to Community-acquired PNA / Lactic acidosis - sepsis POA with elevated lactate, mild leukocytosis, but stable vitals.  Fluid resuscitated and treated with broad-spectrum antibiotics in the ED.  Lactic acidosis resolved. Continue with Rocephin and Zithromax, day 4 of antibiotics.  Scheduled Mucinex.  Follow-up on cultures - negative to date.  Acute anemia - not present on admission.  Initial Hbg 12.8 >>12.0>>11.8>>9.8>>10.3.  No evidence of bleeding.  Anemia panel showed normal iron, elevated ferritin and TIBC, normal folate, elevated B12.  Check FOBT.  Monitor for bleeding.  Daily CBC.  Transfuse if Hbg < 7.0 (with dialysis).  Rhabdomyolysis, acute, nontraumatic - POA with CK over 1500 >>2226>>1169. Trend CK with AM labs.  Dialysis today.  Unable to give IV fluids in dialysis patient, will overload her.  Elevated troponin - POA with troponin's 158 >> 173 >> 54 this AM.  Cardiology consulted.  Feels T wave changes were related to HTN urgency and troponin elevation due to demand ischemia.  Patient free of chest pain.  Heparin drip stopped.   Continue ASA, stain, beta blocker.  ESRD on hemodialysis M/W/F - had not missed any sessions except day of admission.  Nephrology consulted for dialysis.  Schizoaffective disorder,  bipolar type - Depakote level within normal limits on admission.  Psychiatry is consulted given suicide attempt.  Continue Zyprexa, Depakote.  GERD - continue Protonix  Neuropathy - hold gabapentin given AMS  DVT prophylaxis: heparin injection 5,000 Units Start: 04/07/20 2200   Diet:  Diet Orders (From admission, onward)     Start     Ordered   04/06/20 0216  Diet Heart Room service appropriate? Yes; Fluid consistency: Thin  Diet effective now       Question Answer Comment  Room service appropriate? Yes   Fluid consistency: Thin      04/06/20 0219            Code Status: Full Code    Subjective 04/09/20    Patient seen with sitter at bedside this AM.  She remains confused and having visual hallucinations.  Denies chest pain, shortness of breath, fevers chills or cough.  She does not otherwise reliably answer questions and perseverates on "figures" she sees that she says are running around.  Continues to ask about going home.  Still disoriented to place and situation   Disposition Plan & Communication   Status is: Inpatient  Remains inpatient appropriate because:Inpatient level of care appropriate due to severity of illness   Dispo: The patient is from: Home              Anticipated d/c is to: Home              Anticipated d/c date is: 2 days              Patient currently is not medically stable to d/c.    Family Communication: unable to reach patient's sister by phone.  Will continue to try.   Consults, Procedures, Significant Events   Consultants:   Cardiology  Nephrology  Procedures:   Dialysis  Antimicrobials:   Rocephin and Zithromax 7/15 >>  Vancomycin, Cefepime, Flagyl 7/14 >> 7/15     Objective   Vitals:   04/08/20 1935 04/08/20 2106 04/08/20 2337 04/09/20 0559  BP: 128/62 (!) 106/56 (!) 110/58 123/68  Pulse: 81 79 75 70  Resp: 17 20 16 16   Temp: 98.2 F (36.8 C) 98.7 F (37.1 C) 98.1 F (36.7 C) 98.4 F (36.9 C)  TempSrc: Oral Oral Oral Oral  SpO2: 99% 98% 99% 99%  Weight:      Height:        Intake/Output Summary (Last 24 hours) at 04/09/2020 0727 Last data filed at 04/09/2020 0300 Gross per 24 hour  Intake 100.42 ml  Output 1500 ml  Net -1399.58 ml   Filed Weights   04/05/20 1901 04/06/20 1752 04/07/20 0632  Weight: 71.7 kg 56.1 kg 68.9 kg     Physical Exam:  General exam: awake and alert, no acute distress Respiratory system: CTAB, no wheezes or rhonchi, normal respiratory effort. Cardiovascular system: normal S1/S2, RRR, no pedal edema.   Central nervous system: A&O x2 (person and time). no focal neurologic deficits, normal speech Extremities: moves all, no edema, normal tone Psychiatry: normal mood, congruent affect, nonsensical speech content, visual hallucinations  Labs   Data Reviewed: I have personally reviewed following labs and imaging studies  CBC: Recent Labs  Lab 04/05/20 1911 04/06/20 1555 04/07/20 0131 04/08/20 0433 04/09/20 0543  WBC 10.7* 5.8 6.7 5.3 5.4  NEUTROABS  --   --  4.3 3.3 3.3  HGB 12.8 12.0 11.8* 9.8* 10.3*  HCT 37.9 35.9* 35.1* 28.8* 31.7*  MCV 95.9 96.8 95.9  96.0 99.7  PLT 198 200 190 154 631*   Basic Metabolic Panel: Recent Labs  Lab 04/05/20 1938 04/06/20 1555 04/07/20 0131 04/08/20 0433 04/09/20 0543  NA 141  --  139 137 135  K 4.2  --  4.3 4.2 4.9  CL 94*  --  97* 99 95*  CO2 29  --  29 26 28   GLUCOSE 90  --  80 88 95  BUN 43*  --  26* 58* 47*  CREATININE 7.37*  --  4.96* 7.50* 6.17*  CALCIUM 9.7  --  9.5 8.2* 8.9  MG  --   --  1.8 1.8 1.7  PHOS  --  2.6  --   --   --    GFR: Estimated Creatinine Clearance: 7.5 mL/min (A) (by C-G formula based on SCr of 6.17 mg/dL (H)). Liver Function Tests: Recent Labs  Lab 04/05/20 1938  AST 69*  ALT 27  ALKPHOS 88  BILITOT 0.8  PROT 8.3*  ALBUMIN 4.1   No results for input(s): LIPASE, AMYLASE in the last 168 hours. Recent Labs  Lab 04/08/20 1330  AMMONIA 26   Coagulation Profile: Recent Labs  Lab 04/06/20 0146  INR 1.0   Cardiac Enzymes: Recent Labs  Lab 04/05/20 1938 04/07/20 0753 04/08/20 0433  CKTOTAL 1,711* 2,226* 1,169*   BNP (last 3 results) No results for input(s): PROBNP in the last 8760 hours. HbA1C: No results for input(s): HGBA1C in the last 72 hours. CBG: No results for input(s): GLUCAP  in the last 168 hours. Lipid Profile: No results for input(s): CHOL, HDL, LDLCALC, TRIG, CHOLHDL, LDLDIRECT in the last 72 hours. Thyroid Function Tests: No results for input(s): TSH, T4TOTAL, FREET4, T3FREE, THYROIDAB in the last 72 hours. Anemia Panel: Recent Labs    04/08/20 1330  VITAMINB12 1,077*  FOLATE 15.9  FERRITIN 1,675*  TIBC 137*  IRON 49  RETICCTPCT 2.3   Sepsis Labs: Recent Labs  Lab 04/06/20 0027 04/06/20 0227 04/07/20 0753  LATICACIDVEN 2.8* 2.9* 1.4    Recent Results (from the past 240 hour(s))  Culture, blood (routine x 2)     Status: None (Preliminary result)   Collection Time: 04/05/20 11:08 PM   Specimen: BLOOD RIGHT FOREARM  Result Value Ref Range Status   Specimen Description BLOOD RIGHT FOREARM  Final   Special Requests   Final    BOTTLES DRAWN AEROBIC ONLY Blood Culture adequate volume   Culture   Final    NO GROWTH 2 DAYS Performed at [facility], [address], [place], [place] [address]    Report Status PENDING  Incomplete  MRSA PCR Screening     Status: Abnormal   Collection Time: 04/05/20 11:15 PM   Specimen: Nasopharyngeal  Result Value Ref Range Status   MRSA by PCR POSITIVE (A) NEGATIVE Final    Comment:        The GeneXpert MRSA Assay (FDA approved for NASAL specimens only), is one component of a comprehensive MRSA colonization surveillance program. It is not intended to diagnose MRSA infection nor to guide or monitor treatment for MRSA infections. RESULT CALLED TO, READ BACK BY AND VERIFIED WITH: LILLY WALKER@1320  ON 04/07/20 BY HKP Performed at [facility], [address] [place],  [address]   SARS Coronavirus 2 by RT PCR (hospital order, performed in [facility] hospital lab) Nasopharyngeal Nasopharyngeal Swab     Status: None   Collection Time: 04/05/20 11:31 PM   Specimen: Nasopharyngeal Swab  Result Value Ref  Range Status   SARS Coronavirus 2 NEGATIVE NEGATIVE Final    Comment:  (NOTE) SARS-CoV-2 target nucleic acids are NOT DETECTED.  The SARS-CoV-2 RNA is generally detectable in upper and lower respiratory specimens during the acute phase of infection. The lowest concentration of SARS-CoV-2 viral copies this assay can detect is 250 copies / mL. A negative result does not preclude SARS-CoV-2 infection and should not be used as the sole basis for treatment or other patient management decisions.  A negative result may occur with improper specimen collection / handling, submission of specimen other than nasopharyngeal swab, presence of viral mutation(s) within the areas targeted by this assay, and inadequate number of viral copies (<250 copies / mL). A negative result must be combined with clinical observations, patient history, and epidemiological information.  Fact Sheet for Patients:   StrictlyIdeas.no  Fact Sheet for Healthcare Providers: BankingDealers.co.za  This test is not yet approved or  cleared by the [place] FDA and has been authorized for detection and/or diagnosis of SARS-CoV-2 by FDA under an Emergency Use Authorization (EUA).  This EUA will remain in effect (meaning this test can be used) for the duration of the COVID-19 declaration under Section 564(b)(1) of the Act, 21 U.S.C. section 360bbb-3(b)(1), unless the authorization is terminated or revoked sooner.  Performed at [facility], [address] [place], [place] [address]   Culture, blood (routine x 2)     Status: None (Preliminary result)   Collection Time: 04/06/20 12:27 AM   Specimen: BLOOD RIGHT HAND  Result Value Ref Range Status   Specimen Description BLOOD RIGHT HAND  Final   Special Requests   Final    BOTTLES DRAWN AEROBIC ONLY Blood Culture adequate volume   Culture   Final    NO GROWTH 2 DAYS Performed at [facility], [address] [place], [place] [address]    Report Status PENDING  Incomplete       Imaging Studies   MR BRAIN WO CONTRAST  Result Date: 04/09/2020 CLINICAL DATA:  Encephalopathy.  ESRD. EXAM: MRI HEAD WITHOUT CONTRAST TECHNIQUE: Multiplanar, multiecho pulse sequences of the brain and surrounding structures were obtained without intravenous contrast. COMPARISON:  CT head 04/06/2020, 01/08/2019 FINDINGS: Brain: Ventricular enlargement consistent with hydrocephalus. Ventricles are larger than expected for the level of atrophy. Ventricular size has progressed since 01/01/2019. Negative for acute infarct.  No significant chronic ischemia. Right convexity meningioma measuring 17 x 16 mm with calcification on CT. No significant brain edema. There is dense ossification of the falx. There is chronic microhemorrhage in the brain stem on the left at the pontomedullary junction. Small area of microhemorrhage in the right lower pons. Vascular: Normal arterial flow voids. Skull and upper cervical spine: No focal skeletal lesion. Sinuses/Orbits: Mild mucosal edema paranasal sinuses. Negative orbit Other: None IMPRESSION: Ventricular enlargement consistent with mild hydrocephalus. Progressive ventricular dilatation since the CT of 01/17/2019 Right convexity meningioma without brain edema Negative for acute or chronic ischemia. Chronic hemorrhage in the brainstem. Electronically Signed   By: Franchot Gallo M.D.   On: 04/09/2020 07:25     Medications   Scheduled Meds: . aspirin EC  81 mg Oral Daily  . azithromycin  500 mg Oral Daily  . Chlorhexidine Gluconate Cloth  6 each Topical Q0600  . dextromethorphan-guaiFENesin  1 tablet Oral BID  . divalproex  250 mg Oral BID  . docusate sodium  100 mg Oral BID  . heparin injection (subcutaneous)  5,000 Units Subcutaneous Q8H  .  metoprolol tartrate  12.5 mg Oral BID  . OLANZapine  15 mg Oral QHS  . pantoprazole  40 mg Oral Daily  . thiamine  50 mg Oral Daily   Continuous Infusions: . cefTRIAXone (ROCEPHIN)  IV Stopped (04/09/20 0009)        LOS: 3 days    Time spent: 25 minutes    Ezekiel Slocumb, DO [facility]  04/09/2020, 7:27 AM    If 7PM-7AM, please contact night-coverage. How to contact the [facility] Attending or Consulting provider [place] or covering provider during after hours [place], for this patient?    1. Check the care team in [facility] and look for a) attending/consulting [facility] provider listed and b) the [facility] team listed 2. Log into [email or web address] and use [facility]'s universal password to access. If you do not have the password, please contact the hospital operator. 3. Locate the [facility] provider you are looking for under [facility] and page to a number that you can be directly reached. 4. If you still have difficulty reaching the provider, please page the [facility] (Director on Call) for the Hospitalists listed on amion for assistance.

## 2020-04-09 NOTE — Evaluation (Signed)
Physical Therapy Evaluation Patient Details Name: Madison [name] MRN: [id number] DOB: 12-11-1950 Today's Date: 04/09/2020   History of Present Illness  Madison [name] is a 69 y.o. female with medical history significant for schizophrenia and bipolar mood disorder, ESRD on HD MWF last dialysis 04/04/2020, with no missed dialysis sessions who was brought in after she was found lying on the ground at the neighbors house for an unknown period of time.  Most of the history is given by the sister who states that she was sleeping and that her sister would not have been outside for more than a few hours.  Clinical Impression  Pt is a 69 year old F who was admitted for acute metabolic encephalopy with PMH as described above. Pt performs bed mobility with CGA/supervision, transfers with CGA/min A, and ambulation with min A. Pt was previously independent with ADLs and household/community ambulation without AD, living with sister who is available 24/7. Pt encountered supine in bed, immediately expressing displeasure with her hospital stay and remarking that hospital staff has been "moving and messing with" her belongings; pt is alert and oriented x4 though requiring frequent redirection to complete PT eval secondary to aforementioned. Pt performing supine<>sit with HOB slightly elevated with CGA for trunk management during supine>sit, and returns to bed with supervision only. Pt demonstrating poor safety awareness with ambulation, at times refusing to follow PT recommendations concerning use of assistive device, and stating "don't help me" throughout. Trialed both with and without RW for gait trial, with pt's gait pattern initially improving with use of RW, though pt requiring cuing for safe management particularly with maintaining close proximity and safely turning, and pt then refusing use of RW for remainder of trial electing to walk quickly back to pt's room, demonstrating poor safety awareness and balance. Pt  demonstrates deficits with balance, strength, and safety awareness requiring continued skilled PT intervention to promote safety with functional mobility and return to PLOF.     Follow Up Recommendations Home health PT; supervision with mobility/OOB    Equipment Recommendations  Other (comment);None recommended by PT (pt has RW, cane, electric scooter)    Recommendations for Other Services       Precautions / Restrictions Precautions Precautions: Fall Restrictions Weight Bearing Restrictions: No      Mobility  Bed Mobility Overal bed mobility: Needs Assistance Bed Mobility: Supine to Sit;Sit to Supine     Supine to sit: Min guard Sit to supine: Supervision   General bed mobility comments: supine>sit with HOB elevated  Transfers Overall transfer level: Needs assistance   Transfers: Sit to/from Stand Sit to Stand: Min guard;Min assist         General transfer comment: STS CGA from bedside without AD; requiring increased assistance briefly with mild LOB posteriorly once standing  Ambulation/Gait Ambulation/Gait assistance: Min guard;Min assist Gait Distance (Feet): 250 Feet Assistive device: Rolling walker (2 wheeled);None (trialing with and w/o RW) Gait Pattern/deviations: Narrow base of support;Drifts right/left;Step-through pattern;Shuffle     General Gait Details: Trialing with and w/o RW; improved gait pattern (increased step length, speed) with RW - however, pt demos poor RW management, requiring frequent cuing to maintain proximity with poor carryover and for safety with turning, with pt later insisting on leaving RW and attempting to very quickly walk back to room without RW, demonstrating drifting left/right and near scissoring at times.  Stairs            Wheelchair Mobility    Modified Rankin (Stroke Patients Only)  Balance Overall balance assessment: Needs assistance Sitting-balance support: Feet unsupported Sitting balance-Leahy Scale:  Fair Sitting balance - Comments: Tendency to lean posteriorly with LE perturbations; able to steady sit statically EOB without UE support Postural control: Posterior lean Standing balance support: During functional activity;No upper extremity supported Standing balance-Leahy Scale: Fair Standing balance comment: Upon standing initially, leaning posteriorly requiring increased assistance (min A), though then able to perform standing dynamic balance activities with CGA                             Pertinent Vitals/Pain Pain Assessment: No/denies pain    Home Living Family/patient expects to be discharged to:: Private residence Living Arrangements: Other relatives (sister) Available Help at Discharge: Family;Available 24 hours/day (lives with sister; friend Wille Glaser) available PRN) Type of Home: House Home Access: Stairs to enter Entrance Stairs-Rails: None Entrance Stairs-Number of Steps: 1 Home Layout: One level Home Equipment: Walker - 2 wheels;Cane - single point;Electric scooter Additional Comments: Pt A&Ox4; however requiring frequent redirection to complete eval due to reports concerns of hospital staff taking or purposely moving belongings out of reach    Prior Function Level of Independence: Independent         Comments: Pt reporting independence with ADLs, household and community ambulation without AD - denying any falls.     Hand Dominance        Extremity/Trunk Assessment   Upper Extremity Assessment Upper Extremity Assessment: Generalized weakness;Defer to OT evaluation    Lower Extremity Assessment Lower Extremity Assessment: Generalized weakness;Difficult to assess due to impaired cognition;RLE deficits/detail;LLE deficits/detail (difficulty performing formal strength/balance testing due to pt not following commands, instead generally expressing she is unhappy with her treatment during hospital stay, making accusatory remarks of staff tampering with  belongings.) RLE Deficits / Details: difficulty performing formal strength/balance testing due to pt not following commands, instead generally expressing she is unhappy with her treatment during hospital stay. Grossly 3+/5 LEs       Communication   Communication: No difficulties  Cognition Arousal/Alertness: Awake/alert Behavior During Therapy: Agitated (mildly agitated, accusing hospital staff of [place] with her belongings) Overall Cognitive Status: No family/caregiver present to determine baseline cognitive functioning (A&Ox4 though mildly agitated and making accusing remarks of hospital staff)                                 General Comments: A&Ox4 though mildly agitated and making accusatory remarks of hospital staff      General Comments      Exercises     Assessment/Plan    PT Assessment Patient needs continued PT services  PT Problem List Decreased strength;Decreased mobility;Decreased safety awareness       PT Treatment Interventions DME instruction;Therapeutic exercise;Gait training;Balance training;Neuromuscular re-education;Stair training;Functional mobility training;Therapeutic activities;Patient/family education    PT Goals (Current goals can be found in the Care Plan section)  Acute Rehab PT Goals Patient Stated Goal: to go home PT Goal Formulation: With patient Time For Goal Achievement: 04/23/20 Potential to Achieve Goals: Good    Frequency Min 2X/week   Barriers to discharge        Co-evaluation               AM-PAC PT "6 Clicks" Mobility  Outcome Measure Help needed turning from your back to your side while in a flat bed without using bedrails?: A Little Help needed moving from  lying on your back to sitting on the side of a flat bed without using bedrails?: A Little Help needed moving to and from a bed to a chair (including a wheelchair)?: A Little Help needed standing up from a chair using your arms (e.g., wheelchair or bedside  chair)?: A Little Help needed to walk in hospital room?: A Little Help needed climbing 3-5 steps with a railing? : A Little 6 Click Score: 18    End of Session Equipment Utilized During Treatment: Gait belt Activity Tolerance: Treatment limited secondary to agitation Patient left: Other (comment) (pt left seated bedside with handoff to OT)   PT Visit Diagnosis: Unsteadiness on feet (R26.81);Muscle weakness (generalized) (M62.81);History of falling (Z91.81)    Time: 8022-1798 PT Time Calculation (min) (ACUTE ONLY): 34 min   Charges:   PT Evaluation $PT Eval Moderate Complexity: 1 Mod PT Treatments $Gait Training: 8-22 mins        Petra Kuba, PT, DPT 04/09/20, 10:47 AM

## 2020-04-09 NOTE — Evaluation (Signed)
Occupational Therapy Evaluation Patient Details Name: Madison [name] MRN: [id number] DOB: Nov 02, 1950 Today's Date: 04/09/2020    History of Present Illness Madison [name] is a 69 y.o. female with medical history significant for schizophrenia and bipolar mood disorder, ESRD on HD MWF last dialysis 04/04/2020, with no missed dialysis sessions who was brought in after she was found lying on the ground at the neighbors house for an unknown period of time.  Most of the history is given by the sister who states that she was sleeping and that her sister would not have been outside for more than a few hours.   Clinical Impression   Madison [name] was seen for OT evaluation this date. Prior to hospital admission, pt was Independent c mobility/ADLs and reports 1 fall this year (pt inconsistent historian between OT and PT evaluations). Pt lives c sister who pt complains about t/o session is "stealing and mistreating" her. Pt presents to acute OT demonstrating impaired ADL performance, functional cognition, and functional mobility 2/2 decreased safety awareness, poor insight into deficits, and functional strength/balance deficits.  Upon arrival to room pt sitting EOB c PT having completed lap in hallway. Pt is A&Ox3 (disoriented to situation) however demonstrates poor STM and poor ability to follow logical cues t/o - pt referencing previous fall in Feb resulting in hospital visit and finger injury but unable to state time of injury stating "it happened when snow was on ground." Pt demonstrates poor insight into deficits and needs MAX VCs for sequencing stating "I know I could stand up by myself if they stop reaching for me" and "do I spit in the sink?" Pt currently requires MAX VCs + CGA for tooth brushing standing sink side - pt took >10 mins to complete tooth brushing despite MAX prompts and assist for sequencing. CGA for ADL t/f - decreasing to MIN A at times to correct minor LOBs (pt is impulsive and dislikes CGA  which increases falls risk 2/2 pt pulling away). Pt would benefit from skilled OT to address noted impairments and functional limitations (see below for any additional details) in order to maximize safety and independence while minimizing falls risk and caregiver burden. Upon hospital discharge, recommend HHOT to maximize pt safety and return to functional independence during meaningful occupations of daily life.     Follow Up Recommendations  Home health OT;Supervision/Assistance - 24 hour    Equipment Recommendations  None recommended by OT    Recommendations for Other Services       Precautions / Restrictions Precautions Precautions: Fall Restrictions Weight Bearing Restrictions: No      Mobility Bed Mobility Overal bed mobility: Needs Assistance Bed Mobility: Sit to Supine     Supine to sit: Min guard Sit to supine: Supervision   General bed mobility comments: supine>sit with HOB elevated  Transfers Overall transfer level: Needs assistance   Transfers: Sit to/from Stand Sit to Stand: Min guard         General transfer comment: Pt is impulsive and dislikes CGA which increases falls risk 2/2 pt pulling away    Balance Overall balance assessment: Needs assistance Sitting-balance support: Feet unsupported Sitting balance-Leahy Scale: Fair Sitting balance - Comments:  Postural control: Posterior lean Standing balance support: Single extremity supported;During functional activity Standing balance-Leahy Scale: Fair Standing balance comment: MIN A to correct intermittent minor LOBs during dynamic standing task  ADL either performed or assessed with clinical judgement   ADL Overall ADL's : Needs assistance/impaired                                       General ADL Comments: MAX VCs and CGA for tooth brushing standing sink side - pt took >10 mins to complete tooth brushing despite MAX prompts and assist for  sequencing. CGA for ADL t/f - decreasing to MIN A at times 2/2 impulsivity. MOD I self-drinking at bed level.      Vision         Perception     Praxis      Pertinent Vitals/Pain Pain Assessment: No/denies pain     Hand Dominance Right   Extremity/Trunk Assessment Upper Extremity Assessment Upper Extremity Assessment: Generalized weakness (LUE intention tremor noted )   Lower Extremity Assessment Lower Extremity Assessment: Generalized weakness RLE Deficits / Details:        Communication Communication Communication: No difficulties (Perseverative, tangential speech )   Cognition Arousal/Alertness: Awake/alert Behavior During Therapy: Agitated Overall Cognitive Status: No family/caregiver present to determine baseline cognitive functioning                                 General Comments: A&Ox3 (disoriented to situation) however demonstrates poor STM and poor ability to follow logical cues - pt referencing previous fall resulting in hospital visit and finger injury but unable to state time of injury stating "it happened when snow was on ground." Pt demonstrates poor insight into deficits and needs MAX VCs for sequencing stating "I know I could stand up by myself if they stop reaching for me" and "do I spit in the sink?"   General Comments  Scab noted to chin - pt perseverative on it stating "that girl messed me up and said she was coming back with her 2 big dogs"    Exercises Exercises: Other exercises Other Exercises Other Exercises: Pt educated re: OT role, DME recs, falls prevention, re-oriented to situation Other Exercises: Tooth brushing, sitting/standing balance/tolerance, sit>sup, sit<>stand, self-feeding/drinking    Shoulder Instructions      Home Living Family/patient expects to be discharged to:: Private residence Living Arrangements: Other relatives (sister) Available Help at Discharge: Family;Available 24 hours/day (lives with sister; friend  Wille Glaser) available PRN) Type of Home: House Home Access: Stairs to enter [employer] of Steps: 1 Entrance Stairs-Rails: None Home Layout: One level     Bathroom Shower/Tub: Tub/shower unit         Home Equipment: Environmental consultant - 2 wheels;Cane - single point;Electric scooter   Additional Comments: Pt perseverative on sister stealing from her       Prior Functioning/Environment Level of Independence: Independent        Comments: Pt reports friend (Joe) takes her to grocery store (states sister refuses to take her). Reports 1 fall this year         OT Problem List: Decreased strength;Impaired balance (sitting and/or standing);Decreased cognition;Decreased safety awareness;Decreased knowledge of use of DME or AE      OT Treatment/Interventions: Self-care/ADL training;Therapeutic exercise;Energy conservation;DME and/or AE instruction;Therapeutic activities;Patient/family education;Balance training;Cognitive remediation/compensation    OT Goals(Current goals can be found in the care plan section) Acute Rehab OT Goals Patient Stated Goal: to go home OT Goal Formulation: With patient Time For Goal Achievement: 04/23/20 Potential to  Achieve Goals: Fair ADL Goals Pt Will Perform Grooming: with supervision (c MIN VCs for sequencing) Pt Will Perform Lower Body Dressing: with supervision;sit to/from stand (c MIN VCs for sequencing) Pt Will Transfer to Toilet: with supervision;ambulating;regular height toilet (c LRAD PRN) Additional ADL Goal #1: Pt will Indpendently verbalize plan to implement x3 falls prevention strategies  OT Frequency: Min 1X/week   Barriers to D/C: Decreased caregiver support;Inaccessible home environment          Co-evaluation              AM-PAC OT "6 Clicks" Daily Activity     Outcome Measure Help from another person eating meals?: A Little Help from another person taking care of personal grooming?: A Little Help from another person toileting,  which includes using toliet, bedpan, or urinal?: A Little Help from another person bathing (including washing, rinsing, drying)?: A Little Help from another person to put on and taking off regular upper body clothing?: A Little Help from another person to put on and taking off regular lower body clothing?: A Little 6 Click Score: 18   End of Session Equipment Utilized During Treatment: Gait belt  Activity Tolerance:  (Pt limited by cognition) Patient left: in bed;with call bell/phone within reach;with bed alarm set  OT Visit Diagnosis: Unsteadiness on feet (R26.81);Other abnormalities of gait and mobility (R26.89)                Time: 1245-8099 OT Time Calculation (min): 30 min Charges:  OT General Charges $OT Visit: 1 Visit OT Evaluation $OT Eval Moderate Complexity: 1 Mod OT Treatments $Self Care/Home Management : 8-22 mins $Therapeutic Activity: 8-22 mins  Dessie Coma, M.S. OTR/L  04/09/20, 11:36 AM

## 2020-04-09 NOTE — Consult Note (Signed)
Reason for Consult: encephalopathy Requesting Physician: Dr. Arbutus Ped   CC: confusion     HPI: Madison [name] is an 69 y.o. female  with medical history significant for schizophrenia and bipolar mood disorder, ESRD on HD MWF who was brought in after she was found lying on the ground at the neighbors house for an unknown period of time. On admission elevated CK and LAC Patient is s/p HD yesterday   Past Medical History:  Diagnosis Date  . Anxiety   . ESRD (end stage renal disease) on dialysis ([place])   . Neuropathy   . Schizophrenia [facility])     Past Surgical History:  Procedure Laterality Date  . CHOLECYSTECTOMY    . COLONOSCOPY N/A 01/20/2016   Dr. Oneida Alar: 12 mm tubular adenoma removed from the transverse colon, 4 hyperplastic polyps removed from the rectum and sigmoid colon.  Next colonoscopy planned for April 2020.  Marland Kitchen ESOPHAGOGASTRODUODENOSCOPY N/A 12/19/2017   Procedure: ESOPHAGOGASTRODUODENOSCOPY (EGD);  Surgeon: [name], MD;  Location[facility];  Service: Endoscopy;  Laterality: N/A;  8:30am  . fistula left arm    . GIVENS CAPSULE STUDY N/A 01/14/2018   Procedure: GIVENS CAPSULE STUDY;  Surgeon: [name], MD;  Location: [facility];  Service: Endoscopy;  Laterality: N/A;  7:30am  . HEMICOLECTOMY Right   . TUBAL LIGATION      Family History  Problem Relation Age of Onset  . Alzheimer's disease Mother   . Cancer Mother        pancreatic  . Diabetes Mother   . Prostate cancer Father   . Kidney failure Sister   . Breast cancer Neg Hx   . Colon cancer Neg Hx     Social History:  reports that she has never smoked. She has never used smokeless tobacco. She reports that she does not drink alcohol and does not use drugs.  No Known Allergies  Medications: I have reviewed the patient's current medications.  ROS: Unable to obtain due confusion   Physical Examination: Blood pressure 135/63, pulse 72, temperature 98.2 F (36.8 C), temperature source Oral,  resp. rate 18, height 5\' 1"  (1.549 m), weight 68.9 kg, SpO2 97 %.   Neurological Examination   Mental Status: Alert to name and later can state she is in hospital.  Cranial Nerves: II: Discs flat bilaterally; Visual fields grossly normal, pupils equal, round, reactive to light and accommodation III,IV, VI: ptosis not present, extra-ocular motions intact bilaterally V,VII: smile symmetric, facial light touch sensation normal bilaterally VIII: hearing normal bilaterally XI: bilateral shoulder shrug XII: midline tongue extension Motor: Right : Upper extremity   5/5    Left:     Upper extremity   5/5  Lower extremity   4/5     Lower extremity   4/5 Tone and bulk:normal tone throughout; no atrophy noted Sensory: Pinprick and light touch intact throughout, bilaterally Deep Tendon Reflexes: 1+ and symmetric throughout Plantars: Right: downgoing   Left: downgoing Cerebellar: normal finger-to-nose      Laboratory Studies:   Basic Metabolic Panel: Recent Labs  Lab 04/05/20 1938 04/05/20 1938 04/06/20 1555 04/07/20 0131 04/08/20 0433 04/09/20 0543  NA 141  --   --  139 137 135  K 4.2  --   --  4.3 4.2 4.9  CL 94*  --   --  97* 99 95*  CO2 29  --   --  29 26 28   GLUCOSE 90  --   --  80  88 95  BUN 43*  --   --  26* 58* 47*  CREATININE 7.37*  --   --  4.96* 7.50* 6.17*  CALCIUM 9.7   < >  --  9.5 8.2* 8.9  MG  --   --   --  1.8 1.8 1.7  PHOS  --   --  2.6  --   --   --    < > = values in this interval not displayed.    Liver Function Tests: Recent Labs  Lab 04/05/20 1938  AST 69*  ALT 27  ALKPHOS 88  BILITOT 0.8  PROT 8.3*  ALBUMIN 4.1   No results for input(s): LIPASE, AMYLASE in the last 168 hours. Recent Labs  Lab 04/08/20 1330  AMMONIA 26    CBC: Recent Labs  Lab 04/05/20 1911 04/06/20 1555 04/07/20 0131 04/08/20 0433 04/09/20 0543  WBC 10.7* 5.8 6.7 5.3 5.4  NEUTROABS  --   --  4.3 3.3 3.3  HGB 12.8 12.0 11.8* 9.8* 10.3*  HCT 37.9 35.9* 35.1*  28.8* 31.7*  MCV 95.9 96.8 95.9 96.0 99.7  PLT 198 200 190 154 146*    Cardiac Enzymes: Recent Labs  Lab 04/05/20 1938 04/07/20 0753 04/08/20 0433  CKTOTAL 1,711* 2,226* 1,169*    BNP: Invalid input(s): POCBNP  CBG: No results for input(s): GLUCAP in the last 168 hours.  Microbiology: Results for orders placed or performed during the hospital encounter of 04/05/20  Culture, blood (routine x 2)     Status: None (Preliminary result)   Collection Time: 04/05/20 11:08 PM   Specimen: BLOOD RIGHT FOREARM  Result Value Ref Range Status   Specimen Description BLOOD RIGHT FOREARM  Final   Special Requests   Final    BOTTLES DRAWN AEROBIC ONLY Blood Culture adequate volume   Culture   Final    NO GROWTH 3 DAYS Performed at [facility], [address], [place], [place] [address]    Report Status PENDING  Incomplete  MRSA PCR Screening     Status: Abnormal   Collection Time: 04/05/20 11:15 PM   Specimen: Nasopharyngeal  Result Value Ref Range Status   MRSA by PCR POSITIVE (A) NEGATIVE Final    Comment:        The GeneXpert MRSA Assay (FDA approved for NASAL specimens only), is one component of a comprehensive MRSA colonization surveillance program. It is not intended to diagnose MRSA infection nor to guide or monitor treatment for MRSA infections. RESULT CALLED TO, READ BACK BY AND VERIFIED WITH: LILLY [email or web address]  ON 04/07/20 BY HKP Performed at [facility], [place], [place] [address]   SARS Coronavirus 2 by RT PCR (hospital order, performed in [facility] hospital lab) Nasopharyngeal Nasopharyngeal Swab     Status: None   Collection Time: 04/05/20 11:31 PM   Specimen: Nasopharyngeal Swab  Result Value Ref Range Status   SARS Coronavirus 2 NEGATIVE NEGATIVE Final    Comment: (NOTE) SARS-CoV-2 target nucleic acids are NOT DETECTED.  The SARS-CoV-2 RNA is generally detectable in upper and lower respiratory specimens during the  acute phase of infection. The lowest concentration of SARS-CoV-2 viral copies this assay can detect is 250 copies / mL. A negative result does not preclude SARS-CoV-2 infection and should not be used as the sole basis for treatment or other patient management decisions.  A negative result may occur with improper specimen collection / handling, submission of specimen other than nasopharyngeal swab, presence of  viral mutation(s) within the areas targeted by this assay, and inadequate number of viral copies (<250 copies / mL). A negative result must be combined with clinical observations, patient history, and epidemiological information.  Fact Sheet for Patients:   StrictlyIdeas.no  Fact Sheet for Healthcare Providers: BankingDealers.co.za  This test is not yet approved or  cleared by the [place] FDA and has been authorized for detection and/or diagnosis of SARS-CoV-2 by FDA under an Emergency Use Authorization (EUA).  This EUA will remain in effect (meaning this test can be used) for the duration of the COVID-19 declaration under Section 564(b)(1) of the Act, 21 U.S.C. section 360bbb-3(b)(1), unless the authorization is terminated or revoked sooner.  Performed at [facility], [address] [place], [place] [address]   Culture, blood (routine x 2)     Status: None (Preliminary result)   Collection Time: 04/06/20 12:27 AM   Specimen: BLOOD RIGHT HAND  Result Value Ref Range Status   Specimen Description BLOOD RIGHT HAND  Final   Special Requests   Final    BOTTLES DRAWN AEROBIC ONLY Blood Culture adequate volume   Culture   Final    NO GROWTH 3 DAYS Performed at [facility], [address] [place], [place] [address]    Report Status PENDING  Incomplete    Coagulation Studies: No results for input(s): LABPROT, INR in the last 72 hours.  Urinalysis: No results for input(s): COLORURINE, LABSPEC,  PHURINE, GLUCOSEU, HGBUR, BILIRUBINUR, KETONESUR, PROTEINUR, UROBILINOGEN, NITRITE, LEUKOCYTESUR in the last 168 hours.  Invalid input(s): APPERANCEUR  Lipid Panel:  No results found for: CHOL, TRIG, HDL, CHOLHDL, VLDL, LDLCALC  HgbA1C: No results found for: HGBA1C  Urine Drug Screen:      Component Value Date/Time   LABOPIA POSITIVE (A) 04/15/2016 1752   COCAINSCRNUR NONE DETECTED 04/15/2016 1752   LABBENZ NONE DETECTED 04/15/2016 1752   AMPHETMU NONE DETECTED 04/15/2016 1752   THCU NONE DETECTED 04/15/2016 1752   LABBARB NONE DETECTED 04/15/2016 1752    Alcohol Level: No results for input(s): ETH in the last 168 hours.  Other results: EKG: normal EKG, normal sinus rhythm, unchanged from previous tracings.  Imaging: MR BRAIN WO CONTRAST  Result Date: 04/09/2020 CLINICAL DATA:  Encephalopathy.  ESRD. EXAM: MRI HEAD WITHOUT CONTRAST TECHNIQUE: Multiplanar, multiecho pulse sequences of the brain and surrounding structures were obtained without intravenous contrast. COMPARISON:  CT head 04/06/2020, 01/17/2019 FINDINGS: Brain: Ventricular enlargement consistent with hydrocephalus. Ventricles are larger than expected for the level of atrophy. Ventricular size has progressed since 01/19/2019. Negative for acute infarct.  No significant chronic ischemia. Right convexity meningioma measuring 17 x 16 mm with calcification on CT. No significant brain edema. There is dense ossification of the falx. There is chronic microhemorrhage in the brain stem on the left at the pontomedullary junction. Small area of microhemorrhage in the right lower pons. Vascular: Normal arterial flow voids. Skull and upper cervical spine: No focal skeletal lesion. Sinuses/Orbits: Mild mucosal edema paranasal sinuses. Negative orbit Other: None IMPRESSION: Ventricular enlargement consistent with mild hydrocephalus. Progressive ventricular dilatation since the CT of 01/16/2019 Right convexity meningioma without brain edema  Negative for acute or chronic ischemia. Chronic hemorrhage in the brainstem. Electronically Signed   By: Franchot Gallo M.D.   On: 04/09/2020 07:25     Assessment/Plan:  69 y.o. female  with medical history significant for schizophrenia and bipolar mood disorder, ESRD on HD MWF who was brought in after she was found lying on the  ground at the neighbors house for an unknown period of time. On admission elevated CK and LAC Patient is s/p HD yesterday   - Fluent speech and pressured at times. She has no clear focality on her neurological examination  - I do think this is likely metabolic in nature with superimposed psychiatric history possibly pseudodementia type of symptoms - MRI reviewed she has my hydrocephalus but that is related to increased atrophy - Agree with future psychiatric evaluation. Suspect baseline dementia or worsening depression also contributing to current symptoms  04/09/2020, 12:44 PM

## 2020-04-09 NOTE — Progress Notes (Addendum)
[facility]  ROUNDING NOTE   Subjective:   Patient is still confused.  Denies any problems with dialysis yesterday.  Has a telesitter.   Objective:  Vital signs in last 24 hours:  Temp:  [97.7 F (36.5 C)-98.7 F (37.1 C)] 98.2 F (36.8 C) (07/17 0737) Pulse Rate:  [70-85] 72 (07/17 0737) Resp:  [16-20] 18 (07/17 0737) BP: (106-136)/(55-73) 135/63 (07/17 0737) SpO2:  [97 %-100 %] 97 % (07/17 1021)  Weight change:  Filed Weights   04/05/20 1901 04/06/20 1752 04/07/20 0632  Weight: 71.7 kg 56.1 kg 68.9 kg    Intake/Output: I/O last 3 completed shifts: In: 202.3 [IV Piggyback:202.3] Out: 1500 [Other:1500]   Intake/Output this shift:  No intake/output data recorded.  Physical Exam: General: NAD,   Head: Normocephalic, atraumatic. Moist oral mucosal membranes  Eyes: Anicteric, PERRL  Neck: Supple, trachea midline  Lungs:  Clear to auscultation  Heart: Regular rate and rhythm  Abdomen:  Soft, nontender,   Extremities:  No peripheral edema.  Neurologic: Awake and alert, confused   Skin: No lesions  Access: AVF    Basic Metabolic Panel: Recent Labs  Lab 04/05/20 1938 04/05/20 1938 04/06/20 1555 04/07/20 0131 04/08/20 0433 04/09/20 0543  NA 141  --   --  139 137 135  K 4.2  --   --  4.3 4.2 4.9  CL 94*  --   --  97* 99 95*  CO2 29  --   --  29 26 28   GLUCOSE 90  --   --  80 88 95  BUN 43*  --   --  26* 58* 47*  CREATININE 7.37*  --   --  4.96* 7.50* 6.17*  CALCIUM 9.7   < >  --  9.5 8.2* 8.9  MG  --   --   --  1.8 1.8 1.7  PHOS  --   --  2.6  --   --   --    < > = values in this interval not displayed.    Liver Function Tests: Recent Labs  Lab 04/05/20 1938  AST 69*  ALT 27  ALKPHOS 88  BILITOT 0.8  PROT 8.3*  ALBUMIN 4.1   No results for input(s): LIPASE, AMYLASE in the last 168 hours. Recent Labs  Lab 04/08/20 1330  AMMONIA 26    CBC: Recent Labs  Lab 04/05/20 1911 04/06/20 1555 04/07/20 0131 04/08/20 0433  04/09/20 0543  WBC 10.7* 5.8 6.7 5.3 5.4  NEUTROABS  --   --  4.3 3.3 3.3  HGB 12.8 12.0 11.8* 9.8* 10.3*  HCT 37.9 35.9* 35.1* 28.8* 31.7*  MCV 95.9 96.8 95.9 96.0 99.7  PLT 198 200 190 154 146*    Cardiac Enzymes: Recent Labs  Lab 04/05/20 1938 04/07/20 0753 04/08/20 0433  CKTOTAL 1,711* 2,226* 1,169*    BNP: Invalid input(s): POCBNP  CBG: No results for input(s): GLUCAP in the last 168 hours.  Microbiology: Results for orders placed or performed during the hospital encounter of 04/05/20  Culture, blood (routine x 2)     Status: None (Preliminary result)   Collection Time: 04/05/20 11:08 PM   Specimen: BLOOD RIGHT FOREARM  Result Value Ref Range Status   Specimen Description BLOOD RIGHT FOREARM  Final   Special Requests   Final    BOTTLES DRAWN AEROBIC ONLY Blood Culture adequate volume   Culture   Final    NO GROWTH 3 DAYS Performed at [facility], [place]  [address] [place], [place] [address]    Report Status PENDING  Incomplete  MRSA PCR Screening     Status: Abnormal   Collection Time: 04/05/20 11:15 PM   Specimen: Nasopharyngeal  Result Value Ref Range Status   MRSA by PCR POSITIVE (A) NEGATIVE Final    Comment:        The GeneXpert MRSA Assay (FDA approved for NASAL specimens only), is one component of a comprehensive MRSA colonization surveillance program. It is not intended to diagnose MRSA infection nor to guide or monitor treatment for MRSA infections. RESULT CALLED TO, READ BACK BY AND VERIFIED WITH: Madison [name]  ON 04/07/20 BY HKP Performed at [facility], [address] [place], [place] [address]   SARS Coronavirus 2 by RT PCR (hospital order, performed in [facility] hospital lab) Nasopharyngeal Nasopharyngeal Swab     Status: None   Collection Time: 04/05/20 11:31 PM   Specimen: Nasopharyngeal Swab  Result Value Ref Range Status   SARS Coronavirus 2 NEGATIVE NEGATIVE Final    Comment: (NOTE) SARS-CoV-2 target  nucleic acids are NOT DETECTED.  The SARS-CoV-2 RNA is generally detectable in upper and lower respiratory specimens during the acute phase of infection. The lowest concentration of SARS-CoV-2 viral copies this assay can detect is 250 copies / mL. A negative result does not preclude SARS-CoV-2 infection and should not be used as the sole basis for treatment or other patient management decisions.  A negative result may occur with improper specimen collection / handling, submission of specimen other than nasopharyngeal swab, presence of viral mutation(s) within the areas targeted by this assay, and inadequate number of viral copies (<250 copies / mL). A negative result must be combined with clinical observations, patient history, and epidemiological information.  Fact Sheet for Patients:   StrictlyIdeas.no  Fact Sheet for Healthcare Providers: BankingDealers.co.za  This test is not yet approved or  cleared by the [place] FDA and has been authorized for detection and/or diagnosis of SARS-CoV-2 by FDA under an Emergency Use Authorization (EUA).  This EUA will remain in effect (meaning this test can be used) for the duration of the COVID-19 declaration under Section 564(b)(1) of the Act, 21 U.S.C. section 360bbb-3(b)(1), unless the authorization is terminated or revoked sooner.  Performed at [facility], [address] [place],  [address]   Culture, blood (routine x 2)     Status: None (Preliminary result)   Collection Time: 04/06/20 12:27 AM   Specimen: BLOOD RIGHT HAND  Result Value Ref Range Status   Specimen Description BLOOD RIGHT HAND  Final   Special Requests   Final    BOTTLES DRAWN AEROBIC ONLY Blood Culture adequate volume   Culture   Final    NO GROWTH 3 DAYS Performed at [facility], [address] [place],  [address]    Report Status PENDING  Incomplete    Coagulation  Studies: No results for input(s): LABPROT, INR in the last 72 hours.  Urinalysis: No results for input(s): COLORURINE, LABSPEC, PHURINE, GLUCOSEU, HGBUR, BILIRUBINUR, KETONESUR, PROTEINUR, UROBILINOGEN, NITRITE, LEUKOCYTESUR in the last 72 hours.  Invalid input(s): APPERANCEUR    Imaging: MR BRAIN WO CONTRAST  Result Date: 04/09/2020 CLINICAL DATA:  Encephalopathy.  ESRD. EXAM: MRI HEAD WITHOUT CONTRAST TECHNIQUE: Multiplanar, multiecho pulse sequences of the brain and surrounding structures were obtained without intravenous contrast. COMPARISON:  CT head 04/06/2020, 01/01/2019 FINDINGS: Brain: Ventricular enlargement consistent with hydrocephalus. Ventricles are larger than expected for the level of atrophy. Ventricular  size has progressed since 01/05/2019. Negative for acute infarct.  No significant chronic ischemia. Right convexity meningioma measuring 17 x 16 mm with calcification on CT. No significant brain edema. There is dense ossification of the falx. There is chronic microhemorrhage in the brain stem on the left at the pontomedullary junction. Small area of microhemorrhage in the right lower pons. Vascular: Normal arterial flow voids. Skull and upper cervical spine: No focal skeletal lesion. Sinuses/Orbits: Mild mucosal edema paranasal sinuses. Negative orbit Other: None IMPRESSION: Ventricular enlargement consistent with mild hydrocephalus. Progressive ventricular dilatation since the CT of 01/19/2019 Right convexity meningioma without brain edema Negative for acute or chronic ischemia. Chronic hemorrhage in the brainstem. Electronically Signed   By: Franchot Gallo M.D.   On: 04/09/2020 07:25     Medications:   . cefTRIAXone (ROCEPHIN)  IV Stopped (04/09/20 0009)   . aspirin EC  81 mg Oral Daily  . Chlorhexidine Gluconate Cloth  6 each Topical Q0600  . dextromethorphan-guaiFENesin  1 tablet Oral BID  . divalproex  250 mg Oral BID  . docusate sodium  100 mg Oral BID  . heparin  injection (subcutaneous)  5,000 Units Subcutaneous Q8H  . metoprolol tartrate  12.5 mg Oral BID  . OLANZapine  15 mg Oral QHS  . pantoprazole  40 mg Oral Daily  . thiamine  50 mg Oral Daily   acetaminophen, ALPRAZolam, bisacodyl, nitroGLYCERIN, ondansetron (ZOFRAN) IV  Assessment/ Plan:  Madison [name] is a 69 y.o.  female with past medical history of schizophrenia, bipolar mood disorder, ESRD on HD MWF, anemia of chronic kidney disease, secondary hyperparathyroidism who was found lying on the ground at neighbor's house for an unknown period of time.  [facility]/MWF 1.  ESRD on HD MWF.  Patient received hemodialysis yesterday, appears to have tolerated it well.  - no urgent need for dialysis today.  - spoke with dialysis staff at [facility] and they reports that this is not her baseline. Does have some mild confusion normally but not of this severity.   2. Anemia of Chronic kidney disease  - hemoglobin 10.3  - receiving ESA therapy as an outpatient   3. Secondary hyperparathyroidism   4. Altered mental status - management per hospitalitis team     LOS: [place] 7/17/202110:40 AM  Patient seen and examined with physician assistant. Agree with above plan.   Lavonia Dana, [facility]  7/17/202111:50 AM

## 2020-04-09 NOTE — Consult Note (Signed)
Rama Anne Fu MD Psychiatry Brief Holding note Saw Patient today more note in am  04/09/20   Major depression severe recurrent Generalized anxiety  Psych factors affecting physical condition Sibling discord Depression and anxiety due to chronic medical condition   Post Encephalopathy and delirium  S/P OD for attention seeking rather than frank SI and plans   Psych social deprivation and stress     Complex patient with major depression with psychosis, severe psych social issues, sibling discord, chronic renal failure Admitted with AMS now somewhat clearing at this time   At this time not imminently suicidal so 1"1  Not needed   She focuses on dysfunctional household and chronic medical that of course make her mood and anxiety worse   No recent Psych admits but not clear who is following her for psych meds.  Collateral pending  She is clearer today and confusion decreasing  She said she took tabs to gain attention rather than frank OD for SI and plans because she needed to get attention from sister and others of how difficult living with sister is.  And she is very distraught and discouraged by chronic renal issues and sequale  Currently oriented times four Not clouded or fluctuant Confusion is clearing to some degree Reliability on details limited Concentration and attention and cloudiness or fluctuance --improving  No active SI HI or plans now  Mood depressed affect constricted Looks gaunt sickly wizened forlorn Makes eye contact  Rapport actually she is doing the best she can  Some errors in recent memory  Fund of knowledge intelligence okay Judgement insight fair  No shakes tremors or tics at this time   Will follow up with call with sister and see from there.   Cymbalta 20 added for depression and anxiety,  Okay with Zyprexa at hs  Depakote level pending   Thanks Rama Anne Fu MD

## 2020-04-10 DIAGNOSIS — I214 Non-ST elevation (NSTEMI) myocardial infarction: Secondary | ICD-10-CM

## 2020-04-10 DIAGNOSIS — E872 Acidosis: Secondary | ICD-10-CM | POA: Diagnosis not present

## 2020-04-10 DIAGNOSIS — G9341 Metabolic encephalopathy: Secondary | ICD-10-CM

## 2020-04-10 DIAGNOSIS — T50904A Poisoning by unspecified drugs, medicaments and biological substances, undetermined, initial encounter: Secondary | ICD-10-CM

## 2020-04-10 DIAGNOSIS — J189 Pneumonia, unspecified organism: Principal | ICD-10-CM

## 2020-04-10 DIAGNOSIS — R778 Other specified abnormalities of plasma proteins: Secondary | ICD-10-CM | POA: Diagnosis not present

## 2020-04-10 DIAGNOSIS — N186 End stage renal disease: Secondary | ICD-10-CM

## 2020-04-10 DIAGNOSIS — Z992 Dependence on renal dialysis: Secondary | ICD-10-CM

## 2020-04-10 DIAGNOSIS — F250 Schizoaffective disorder, bipolar type: Secondary | ICD-10-CM

## 2020-04-10 LAB — HEPATIC FUNCTION PANEL
ALT: 29 U/L (ref 0–44)
AST: 66 U/L — ABNORMAL HIGH (ref 15–41)
Albumin: 2.7 g/dL — ABNORMAL LOW (ref 3.5–5.0)
Alkaline Phosphatase: 53 U/L (ref 38–126)
Bilirubin, Direct: 0.2 mg/dL (ref 0.0–0.2)
Indirect Bilirubin: 0.9 mg/dL (ref 0.3–0.9)
Total Bilirubin: 1.1 mg/dL (ref 0.3–1.2)
Total Protein: 6.2 g/dL — ABNORMAL LOW (ref 6.5–8.1)

## 2020-04-10 LAB — BASIC METABOLIC PANEL
Anion gap: 14 (ref 5–15)
BUN: 65 mg/dL — ABNORMAL HIGH (ref 8–23)
CO2: 24 mmol/L (ref 22–32)
Calcium: 8.6 mg/dL — ABNORMAL LOW (ref 8.9–10.3)
Chloride: 94 mmol/L — ABNORMAL LOW (ref 98–111)
Creatinine, Ser: 8.04 mg/dL — ABNORMAL HIGH (ref 0.44–1.00)
GFR calc Af Amer: 5 mL/min — ABNORMAL LOW (ref >60)
GFR calc non Af Amer: 5 mL/min — ABNORMAL LOW (ref >60)
Glucose, Bld: 92 mg/dL (ref 70–99)
Potassium: 4.8 mmol/L (ref 3.5–5.1)
Sodium: 132 mmol/L — ABNORMAL LOW (ref 135–145)

## 2020-04-10 MED ORDER — AMOXICILLIN-POT CLAVULANATE 875-125 MG PO TABS
1.0000 | ORAL_TABLET | Freq: Two times a day (BID) | ORAL | Status: AC
  Administered 2020-04-10 (×2): 1 via ORAL
  Filled 2020-04-10 (×2): qty 1

## 2020-04-10 NOTE — Consult Note (Signed)
[facility] Face-to-Face Psychiatry Consult   Reason for Consult:  Overdose Referring Physician:  Dr. Damita Dunnings Patient Identification: Madison [name] MRN:  [id number] Principal Diagnosis: Acute metabolic encephalopathy Diagnosis:  Principal Problem:   Acute metabolic encephalopathy Active Problems:   ESRD on hemodialysis ([place])   Schizoaffective disorder, bipolar type ([place])   Rhabdomyolysis   Elevated troponin   Pneumonia   Lactic acidosis   Suspected Sepsis ([place])   Possible NSTEMI (non-ST elevated myocardial infarction) ([place])    Suspected Overdose, undetermined intent, initial encounter   Total Time spent with patient: 25 minutes  Subjective: "I was trying to get my sister's attention.  She is supposed to give me my medication, but has not been giving me my medication."  HPI: Madison [name] is a 69 y.o. female patient with medical history significant for schizophrenia and bipolar mood disorder, ESRD on HD MWF last dialysis 04/04/2020, with no missed dialysis sessions who was brought in after she was found lying on the ground at the neighbors house for an unknown period of time.   Psychiatry consultation is requested for evaluation of mental status and suicidal intent.  Past Psychiatric History: Schizophrenia and bipolar mood disorder  On evaluation today, patient is awake, alert oriented to person, place, date and situation.  Her speech and movements are slow.  Patient describes that she has been eating and sleeping well and she denies problems with concentration or energy.  She states her mood as, "pretty good."  She specifically denies any suicidal ideation, plan, or intent.  She denies homicidal ideation.  She denies any auditory or visual hallucinations, and does not appear to be responding to internal stimuli.  She denies any specific issues/concerns regarding living with her sister currently, but states she is uncertain if she will be able to live with her sister after discharge.  Risk  to Self:  No Risk to Others:  No Prior Inpatient Therapy:  No Prior Outpatient Therapy:  Not clarified  Past Medical History:  Past Medical History:  Diagnosis Date  . Anxiety   . ESRD (end stage renal disease) on dialysis ([place])   . Neuropathy   . Schizophrenia [facility])     Past Surgical History:  Procedure Laterality Date  . CHOLECYSTECTOMY    . COLONOSCOPY N/A 01/20/2016   Dr. Oneida Alar: 12 mm tubular adenoma removed from the transverse colon, 4 hyperplastic polyps removed from the rectum and sigmoid colon.  Next colonoscopy planned for April 2020.  Marland Kitchen ESOPHAGOGASTRODUODENOSCOPY N/A 12/19/2017   Procedure: ESOPHAGOGASTRODUODENOSCOPY (EGD);  Surgeon: [name], MD;  Location: [facility];  Service: Endoscopy;  Laterality: N/A;  8:30am  . fistula left arm    . GIVENS CAPSULE STUDY N/A 01/14/2018   Procedure: GIVENS CAPSULE STUDY;  Surgeon: [name], MD;  Location: [facility];  Service: Endoscopy;  Laterality: N/A;  7:30am  . HEMICOLECTOMY Right   . TUBAL LIGATION     Family History:  Family History  Problem Relation Age of Onset  . Alzheimer's disease Mother   . Cancer Mother        pancreatic  . Diabetes Mother   . Prostate cancer Father   . Kidney failure Sister   . Breast cancer Neg Hx   . Colon cancer Neg Hx    Family Psychiatric  History: None  Social History:  Social History   Substance and Sexual Activity  Alcohol Use No     Social History   Substance and Sexual Activity  Drug Use No    Social History   Socioeconomic History  . Marital status: Single    Spouse name: Not on file  . Number of children: Not on file  . Years of education: Not on file  . Highest education level: Not on file  Occupational History  . Not on file  Tobacco Use  . Smoking status: Never Smoker  . Smokeless tobacco: Never Used  Vaping Use  . Vaping Use: Never used  Substance and Sexual Activity  . Alcohol use: No  . Drug use: No  . Sexual activity: Yes  Other  Topics Concern  . Not on file  Social History Narrative  . Not on file   Social Determinants of Health   Financial Resource Strain:   . Difficulty of Paying Living Expenses:   Food Insecurity:   . Worried About Charity fundraiser in the Last Year:   . Arboriculturist in the Last Year:   Transportation Needs:   . Film/video editor (Medical):   Marland Kitchen Lack of Transportation (Non-Medical):   Physical Activity:   . Days of Exercise per Week:   . Minutes of Exercise per Session:   Stress:   . Feeling of Stress :   Social Connections:   . Frequency of Communication with Friends and Family:   . Frequency of Social Gatherings with Friends and Family:   . Attends Religious Services:   . Active Member of Clubs or Organizations:   . Attends Archivist Meetings:   Marland Kitchen Marital Status:    Additional Social History:  Living with sister  Allergies:  No Known Allergies  Labs:  Results for orders placed or performed during the hospital encounter of 04/05/20 (from the past 48 hour(s))  CBC with Differential/Platelet     Status: Abnormal   Collection Time: 04/09/20  5:43 AM  Result Value Ref Range   WBC 5.4 4.0 - 10.5 K/uL   RBC 3.18 (L) 3.87 - 5.11 MIL/uL   Hemoglobin 10.3 (L) 12.0 - 15.0 g/dL   HCT 31.7 (L) 36 - 46 %   MCV 99.7 80.0 - 100.0 fL   MCH 32.4 26.0 - 34.0 pg   MCHC 32.5 30.0 - 36.0 g/dL   RDW 16.7 (H) 11.5 - 15.5 %   Platelets 146 (L) 150 - 400 K/uL   nRBC 0.0 0.0 - 0.2 %   Neutrophils Relative % 60 %   Neutro Abs 3.3 1.7 - 7.7 K/uL   Lymphocytes Relative 22 %   Lymphs Abs 1.2 0.7 - 4.0 K/uL   Monocytes Relative 14 %   Monocytes Absolute 0.8 0 - 1 K/uL   Eosinophils Relative 3 %   Eosinophils Absolute 0.2 0 - 0 K/uL   Basophils Relative 1 %   Basophils Absolute 0.0 0 - 0 K/uL   Immature Granulocytes 0 %   Abs Immature Granulocytes 0.02 0.00 - 0.07 K/uL    Comment: Performed at [facility], [address] [place],  [address]  Basic  metabolic panel     Status: Abnormal   Collection Time: 04/09/20  5:43 AM  Result Value Ref Range   Sodium 135 135 - 145 mmol/L   Potassium 4.9 3.5 - 5.1 mmol/L   Chloride 95 (L) 98 - 111 mmol/L   CO2 28 22 - 32 mmol/L   Glucose, Bld 95 70 - 99 mg/dL    Comment: Glucose reference range applies only to samples taken after fasting  for at least 8 hours.   BUN 47 (H) 8 - 23 mg/dL   Creatinine, Ser 6.17 (H) 0.44 - 1.00 mg/dL   Calcium 8.9 8.9 - 10.3 mg/dL   GFR calc non Af Amer 6 (L) >60 mL/min   GFR calc Af Amer 7 (L) >60 mL/min   Anion gap 12 5 - 15    Comment: Performed at [facility], [address] [place], [place] [address]  Magnesium     Status: None   Collection Time: 04/09/20  5:43 AM  Result Value Ref Range   Magnesium 1.7 1.7 - 2.4 mg/dL    Comment: Performed at [facility], [address] [place], [place] [address]  Basic metabolic panel     Status: Abnormal   Collection Time: 04/10/20  3:52 AM  Result Value Ref Range   Sodium 132 (L) 135 - 145 mmol/L   Potassium 4.8 3.5 - 5.1 mmol/L   Chloride 94 (L) 98 - 111 mmol/L   CO2 24 22 - 32 mmol/L   Glucose, Bld 92 70 - 99 mg/dL    Comment: Glucose reference range applies only to samples taken after fasting for at least 8 hours.   BUN 65 (H) 8 - 23 mg/dL   Creatinine, Ser 8.04 (H) 0.44 - 1.00 mg/dL   Calcium 8.6 (L) 8.9 - 10.3 mg/dL   GFR calc non Af Amer 5 (L) >60 mL/min   GFR calc Af Amer 5 (L) >60 mL/min   Anion gap 14 5 - 15    Comment: Performed at [facility], [address] [place], [place] [address]  Hepatic function panel     Status: Abnormal   Collection Time: 04/10/20  3:52 AM  Result Value Ref Range   Total Protein 6.2 (L) 6.5 - 8.1 g/dL   Albumin 2.7 (L) 3.5 - 5.0 g/dL   AST 66 (H) 15 - 41 U/L   ALT 29 0 - 44 U/L   Alkaline Phosphatase 53 38 - 126 U/L   Total Bilirubin 1.1 0.3 - 1.2 mg/dL   Bilirubin, Direct 0.2 0.0 - 0.2 mg/dL   Indirect Bilirubin 0.9 0.3 - 0.9 mg/dL     Comment: Performed at [facility], [address], [place], [place] [address]    Current Facility-Administered Medications  Medication Dose Route Frequency Provider Last Rate Last Admin  . acetaminophen (TYLENOL) tablet 650 mg  650 mg Oral Q4H PRN Athena Masse, MD      . ALPRAZolam Duanne Moron) tablet 0.25 mg  0.25 mg Oral TID PRN Nicole Kindred A, DO   0.25 mg at 04/07/20 2641  . amoxicillin-clavulanate (AUGMENTIN) 875-125 MG per tablet 1 tablet  1 tablet Oral Q12H Nicole Kindred A, DO      . aspirin EC tablet 81 mg  81 mg Oral Daily Athena Masse, MD   81 mg at 04/10/20 1008  . bisacodyl (DULCOLAX) suppository 10 mg  10 mg Rectal Daily PRN Sharion Settler, NP      . Chlorhexidine Gluconate Cloth 2 % PADS 6 each  6 each Topical Q0600 Holley Raring, [place], MD   6 each at 04/10/20 0630  . dextromethorphan-guaiFENesin (MUCINEX DM) 30-600 MG per 12 hr tablet 1 tablet  1 tablet Oral BID Nicole Kindred A, DO   1 tablet at 04/10/20 1008  . divalproex (DEPAKOTE) DR tablet 250 mg  250 mg Oral BID Nicole Kindred A, DO   250 mg at 04/10/20 1010  . docusate  sodium (COLACE) capsule 100 mg  100 mg Oral BID Sharion Settler, NP   100 mg at 04/10/20 1010  . DULoxetine (CYMBALTA) DR capsule 20 mg  20 mg Oral Daily Eulas Post, MD   20 mg at 04/10/20 1010  . heparin injection 5,000 Units  5,000 Units Subcutaneous Q8H Nicole Kindred A, DO   5,000 Units at 04/10/20 0630  . metoprolol tartrate (LOPRESSOR) tablet 12.5 mg  12.5 mg Oral BID Nicole Kindred A, DO   12.5 mg at 04/10/20 1008  . nitroGLYCERIN (NITROSTAT) SL tablet 0.4 mg  0.4 mg Sublingual Q5 Min x 3 PRN Athena Masse, MD      . OLANZapine (ZYPREXA) tablet 15 mg  15 mg Oral QHS Nicole Kindred A, DO   15 mg at 04/09/20 2112  . ondansetron (ZOFRAN) injection 4 mg  4 mg Intravenous Q6H PRN Athena Masse, MD      . pantoprazole (PROTONIX) EC tablet 40 mg  40 mg Oral Daily Nicole Kindred A, DO   40 mg at 04/10/20 1010  . thiamine tablet  50 mg  50 mg Oral Daily Nicole Kindred A, DO   50 mg at 04/10/20 1009    Musculoskeletal: Strength & Muscle Tone: decreased Gait & Station: unsteady Patient leans: Not assessed, patient in bed  Psychiatric Specialty Exam: Physical Exam Vitals and nursing note reviewed.  Constitutional:      Comments: Frail and thin  HENT:     Head: Normocephalic and atraumatic.  Cardiovascular:     Rate and Rhythm: Normal rate.  Pulmonary:     Effort: Pulmonary effort is normal. No respiratory distress.  Musculoskeletal:        General: Normal range of motion.  Skin:    General: Skin is dry.  Neurological:     Mental Status: She is alert and oriented to person, place, and time.  Psychiatric:        Mood and Affect: Mood normal.        Behavior: Behavior normal.     Review of Systems  Constitutional: Negative for appetite change. Activity change: decreased.  Respiratory: Negative.   Cardiovascular: Negative.   Gastrointestinal: Negative.   Musculoskeletal: Negative.   Neurological: Positive for weakness.  Psychiatric/Behavioral: Negative for agitation, behavioral problems, confusion (slowed), dysphoric mood, hallucinations and suicidal ideas. The patient is not nervous/anxious.     Blood pressure 132/69, pulse 71, temperature 98.7 F (37.1 C), resp. rate 14, height 5\' 1"  (1.549 m), weight 68.9 kg, SpO2 99 %.Body mass index is 28.72 kg/m.  General Appearance: Casual  Eye Contact:  Good  Speech:  Slow  Volume:  Decreased  Mood:  Euthymic  Affect:  Congruent  Thought Process:  Coherent and Descriptions of Associations: Circumstantial  Orientation:  Full (Time, Place, and Person)  Thought Content:  Logical and Hallucinations: None  Suicidal Thoughts:  No  Homicidal Thoughts:  No  Memory:  Immediate;   Fair Recent;   Fair Remote;   Fair  Judgement:  Impaired  Insight:  Fair  Psychomotor Activity:  Decreased  Concentration:  Concentration: Good and Attention Span: Good  Recall:   [place] of Knowledge:  Good  Language:  Good  Akathisia:  No  Handed:  Right  AIMS (if indicated):     Assets:  Communication Skills Desire for Improvement Financial Resources/Insurance Resilience  ADL's:  Impaired  Cognition:  Impaired,  Mild due to age  Sleep:        Treatment Plan  Summary: Medication management  Continue Cymbalta 20 mg daily for depression and anxiety Continue Depakote 250 mg twice daily for mood stabilization.   Recommend Depakote level.  Ammonia level within normal limits is reassuring Continue Zyprexa 15 mg daily at bedtime for schizophrenia.  Recommend weaning this down to lowest dose tolerable without return of schizophrenia symptoms. Recommend discontinuation of benzodiazepines as needed anxiety medication.  If patient has become dependent on these, consider a tapered regimen.   Disposition: No evidence of imminent risk to self or others at present.   Patient does not meet criteria for psychiatric inpatient admission. Supportive therapy provided about ongoing stressors. Appreciate social work assistance in addressing discharge placement needs.     Lavella Hammock, MD 04/10/2020 3:11 PM

## 2020-04-10 NOTE — Progress Notes (Signed)
PROGRESS NOTE    ANNALISSA MURPHEY   ZOX:[id number]  DOB: 1951/07/01  PCP: Vidal Schwalbe, MD    DOA: 04/05/2020 LOS: 4   Brief Narrative   Madison [name] is a 69 y.o. female with medical history significant for schizoaffective disorder bipolar type, ESRD on HD MWF last dialysis 04/04/2020, with no missed dialysis sessions who was brought in after she was found lying on the ground at the neighbors house for an unknown period of time and with empty pill packs found by patient's sister but unknown if patient overdosed.  Patient later admitted to taking all the pills she could find because she was "tired of it all".  Sister also reported recent increased confusion with the patient, but no recent illnesses, fevers/chills or complaints of other symptoms.  In the ED, patient awake but confused.  Normal vitals.  EKG normal sinus rhythm and no acute ST-T wave changes.  Pertinent labs include WBC 10.7k, CK 1711, troponin 158>>173.  Lactic acid 2.8 >> 2.9.  Normal depakote level.  CXR showed mid and lower lung zones opacities concerning for pneumonia including atypical viral infection vs atelectasis.  Covid-19 PCR negative.  She was fluid resuscitated and treated with broad spectrum antibiotics in the ED per sepsis protocol.  Admitted to hospitalist service.  Cardiology consulted.      Assessment & Plan   Principal Problem:   Acute metabolic encephalopathy Active Problems:   ESRD on hemodialysis (HCC)   Schizoaffective disorder, bipolar type (HCC)   Rhabdomyolysis   Elevated troponin   Pneumonia   Lactic acidosis   Suspected Sepsis ([place])   Possible NSTEMI (non-ST elevated myocardial infarction) ([place])    Suspected Overdose, undetermined intent, initial encounter   Acute metabolic encephalopathy - POA, persistent, still disoriented to place at times, not at her baseline.  Patient was found with empty pill packets, initially admitted to attempted overdose but later reported it was attention  seeking behavior due to difficulty with her sister.  CT head negative for acute findings.  Ammonia normal.  B12 high.  Thiamine level pending.  MRI brain without contrast.  Neurology consulted, MRI finding of enlarged ventricles consistent with central atrophy.  This is likely psych-related issue.  Discussed case with psychiatrist.  Patient is appropriate for [facility] admission once medically stable.  TTS contacted to coordinate.  Suicide attempt by intentional overdose- continue 1:1 sitter.  Psychiatry is consulted, recommendations pending.  Sepsis due to Community-acquired PNA / Lactic acidosis - sepsis POA with elevated lactate, mild leukocytosis, but stable vitals.  Fluid resuscitated and treated with broad-spectrum antibiotics in the ED.  Lactic acidosis resolved. Stop IV Rocephin and Zithromax.  Today is day 5 of antibiotics, will continue with Augmentin for 2 more days.  Scheduled Mucinex.  Follow-up on cultures - negative to date.  Acute anemia - not present on admission.  Initial Hbg 12.8 >>12.0>>11.8>>9.8>>10.3.  No evidence of bleeding.  Anemia panel showed normal iron, elevated ferritin and TIBC, normal folate, elevated B12.  Check FOBT.  Monitor for bleeding.  Daily CBC.  Transfuse if Hbg < 7.0 (with dialysis).  Rhabdomyolysis, acute, nontraumatic - POA with CK over 1500 >>2226>>1169. Unable to give IV fluids in dialysis patient, will overload her.  Elevated troponin - POA with troponin's 158 >> 173 >> 54 this AM.  Cardiology consulted.  Feels T wave changes were related to HTN urgency and troponin elevation due to demand ischemia.  Patient free of chest pain.  Heparin drip stopped.   Continue  ASA, stain, beta blocker.  ESRD on hemodialysis M/W/F - had not missed any sessions except day of admission.  Nephrology consulted for dialysis.  Schizoaffective disorder, bipolar type - Depakote level within normal limits on admission.  Psychiatry is consulted given suicide attempt.  Continue  Zyprexa, Depakote.  GERD - continue Protonix  Neuropathy - hold gabapentin given AMS  DVT prophylaxis: heparin injection 5,000 Units Start: 04/07/20 2200   Diet:  Diet Orders (From admission, onward)    Start     Ordered   04/06/20 0216  Diet Heart Room service appropriate? Yes; Fluid consistency: Thin  Diet effective now       Question Answer Comment  Room service appropriate? Yes   Fluid consistency: Thin      04/06/20 0219            Code Status: Full Code    Subjective 04/10/20    Patient seen with sitter at bedside this AM.  She seems mentally more clear today.  Says she is eating well.  Does continue with tangential speech at times but answers questions a little better.  Denies cough or SOB, CP, abdominal pain, N/V/D or other complaints.  Asks about going home.   Disposition Plan & Communication   Status is: Inpatient  Remains inpatient appropriate because:Inpatient level of care appropriate due to severity of illness   Dispo: The patient is from: Home              Anticipated d/c is to: Home              Anticipated d/c date is: 1 day              Medically stable for discharge: Yes    Family Communication: unable to reach patient's sister by phone.  Will continue to try.   Consults, Procedures, Significant Events   Consultants:   Cardiology  Nephrology  Procedures:   Dialysis  Antimicrobials:   Augmentin 7/18 >>  Rocephin and Zithromax 7/15 >>7/18  Vancomycin, Cefepime, Flagyl 7/14 >> 7/15     Objective   Vitals:   04/09/20 2018 04/10/20 0502 04/10/20 0853 04/10/20 1233  BP: 124/63 (!) 119/55 123/70 132/69  Pulse: 72 73 73 71  Resp: 16 15 14 14   Temp: 98.1 F (36.7 C) 97.6 F (36.4 C) 98.6 F (37 C) 98.7 F (37.1 C)  TempSrc: Oral Oral    SpO2: 99% 99% 100% 99%  Weight:      Height:       No intake or output data in the 24 hours ending 04/10/20 1306 Filed Weights   04/05/20 1901 04/06/20 1752 04/07/20 0632  Weight: 71.7  kg 56.1 kg 68.9 kg    Physical Exam:  General exam: awake and alert, no acute distress Respiratory system: CTAB, no wheezes or rhonchi, normal respiratory effort. Cardiovascular system: normal S1/S2, RRR, no pedal edema.   Central nervous system: A&O x3 today (previously disoriented to place). No focal neurologic deficits, normal speech Extremities: moves all, no edema, normal tone Psychiatry: normal mood, congruent affect, tangential, no apparent hallucinations  Labs   Data Reviewed: I have personally reviewed following labs and imaging studies  CBC: Recent Labs  Lab 04/05/20 1911 04/06/20 1555 04/07/20 0131 04/08/20 0433 04/09/20 0543  WBC 10.7* 5.8 6.7 5.3 5.4  NEUTROABS  --   --  4.3 3.3 3.3  HGB 12.8 12.0 11.8* 9.8* 10.3*  HCT 37.9 35.9* 35.1* 28.8* 31.7*  MCV 95.9 96.8 95.9 96.0 99.7  PLT 198 200 190 154 902*   Basic Metabolic Panel: Recent Labs  Lab 04/05/20 1938 04/06/20 1555 04/07/20 0131 04/08/20 0433 04/09/20 0543 04/10/20 0352  NA 141  --  139 137 135 132*  K 4.2  --  4.3 4.2 4.9 4.8  CL 94*  --  97* 99 95* 94*  CO2 29  --  29 26 28 24   GLUCOSE 90  --  80 88 95 92  BUN 43*  --  26* 58* 47* 65*  CREATININE 7.37*  --  4.96* 7.50* 6.17* 8.04*  CALCIUM 9.7  --  9.5 8.2* 8.9 8.6*  MG  --   --  1.8 1.8 1.7  --   PHOS  --  2.6  --   --   --   --    GFR: Estimated Creatinine Clearance: 5.8 mL/min (A) (by C-G formula based on SCr of 8.04 mg/dL (H)). Liver Function Tests: Recent Labs  Lab 04/05/20 1938 04/10/20 0352  AST 69* 66*  ALT 27 29  ALKPHOS 88 53  BILITOT 0.8 1.1  PROT 8.3* 6.2*  ALBUMIN 4.1 2.7*   No results for input(s): LIPASE, AMYLASE in the last 168 hours. Recent Labs  Lab 04/08/20 1330  AMMONIA 26   Coagulation Profile: Recent Labs  Lab 04/06/20 0146  INR 1.0   Cardiac Enzymes: Recent Labs  Lab 04/05/20 1938 04/07/20 0753 04/08/20 0433  CKTOTAL 1,711* 2,226* 1,169*   BNP (last 3 results) No results for input(s):  PROBNP in the last 8760 hours. HbA1C: No results for input(s): HGBA1C in the last 72 hours. CBG: No results for input(s): GLUCAP in the last 168 hours. Lipid Profile: No results for input(s): CHOL, HDL, LDLCALC, TRIG, CHOLHDL, LDLDIRECT in the last 72 hours. Thyroid Function Tests: No results for input(s): TSH, T4TOTAL, FREET4, T3FREE, THYROIDAB in the last 72 hours. Anemia Panel: Recent Labs    04/08/20 1330  VITAMINB12 1,077*  FOLATE 15.9  FERRITIN 1,675*  TIBC 137*  IRON 49  RETICCTPCT 2.3   Sepsis Labs: Recent Labs  Lab 04/06/20 0027 04/06/20 0227 04/07/20 0753  LATICACIDVEN 2.8* 2.9* 1.4    Recent Results (from the past 240 hour(s))  Culture, blood (routine x 2)     Status: None (Preliminary result)   Collection Time: 04/05/20 11:08 PM   Specimen: BLOOD RIGHT FOREARM  Result Value Ref Range Status   Specimen Description BLOOD RIGHT FOREARM  Final   Special Requests   Final    BOTTLES DRAWN AEROBIC ONLY Blood Culture adequate volume   Culture   Final    NO GROWTH 4 DAYS Performed at [facility], [address] [place], [place] [address]    Report Status PENDING  Incomplete  MRSA PCR Screening     Status: Abnormal   Collection Time: 04/05/20 11:15 PM   Specimen: Nasopharyngeal  Result Value Ref Range Status   MRSA by PCR POSITIVE (A) NEGATIVE Final    Comment:        The GeneXpert MRSA Assay (FDA approved for NASAL specimens only), is one component of a comprehensive MRSA colonization surveillance program. It is not intended to diagnose MRSA infection nor to guide or monitor treatment for MRSA infections. RESULT CALLED TO, READ BACK BY AND VERIFIED WITH: LILLY WALKER@[id number]  ON 04/07/20 BY HKP Performed at [facility], [place], [place] [address]   SARS Coronavirus 2 by RT PCR (hospital order, performed in [facility] hospital lab) Nasopharyngeal Nasopharyngeal Swab  Status: None   Collection Time: 04/05/20 11:31  PM   Specimen: Nasopharyngeal Swab  Result Value Ref Range Status   SARS Coronavirus 2 NEGATIVE NEGATIVE Final    Comment: (NOTE) SARS-CoV-2 target nucleic acids are NOT DETECTED.  The SARS-CoV-2 RNA is generally detectable in upper and lower respiratory specimens during the acute phase of infection. The lowest concentration of SARS-CoV-2 viral copies this assay can detect is 250 copies / mL. A negative result does not preclude SARS-CoV-2 infection and should not be used as the sole basis for treatment or other patient management decisions.  A negative result may occur with improper specimen collection / handling, submission of specimen other than nasopharyngeal swab, presence of viral mutation(s) within the areas targeted by this assay, and inadequate number of viral copies (<250 copies / mL). A negative result must be combined with clinical observations, patient history, and epidemiological information.  Fact Sheet for Patients:   StrictlyIdeas.no  Fact Sheet for Healthcare Providers: BankingDealers.co.za  This test is not yet approved or  cleared by the [place] FDA and has been authorized for detection and/or diagnosis of SARS-CoV-2 by FDA under an Emergency Use Authorization (EUA).  This EUA will remain in effect (meaning this test can be used) for the duration of the COVID-19 declaration under Section 564(b)(1) of the Act, 21 U.S.C. section 360bbb-3(b)(1), unless the authorization is terminated or revoked sooner.  Performed at [facility], [address] [place], [place] [address]   Culture, blood (routine x 2)     Status: None (Preliminary result)   Collection Time: 04/06/20 12:27 AM   Specimen: BLOOD RIGHT HAND  Result Value Ref Range Status   Specimen Description BLOOD RIGHT HAND  Final   Special Requests   Final    BOTTLES DRAWN AEROBIC ONLY Blood Culture adequate volume   Culture   Final    NO GROWTH  4 DAYS Performed at [facility], [address], [place], [place] [address]    Report Status PENDING  Incomplete      Imaging Studies   MR BRAIN WO CONTRAST  Result Date: 04/09/2020 CLINICAL DATA:  Encephalopathy.  ESRD. EXAM: MRI HEAD WITHOUT CONTRAST TECHNIQUE: Multiplanar, multiecho pulse sequences of the brain and surrounding structures were obtained without intravenous contrast. COMPARISON:  CT head 04/06/2020, 01/10/2019 FINDINGS: Brain: Ventricular enlargement consistent with hydrocephalus. Ventricles are larger than expected for the level of atrophy. Ventricular size has progressed since 01/07/2019. Negative for acute infarct.  No significant chronic ischemia. Right convexity meningioma measuring 17 x 16 mm with calcification on CT. No significant brain edema. There is dense ossification of the falx. There is chronic microhemorrhage in the brain stem on the left at the pontomedullary junction. Small area of microhemorrhage in the right lower pons. Vascular: Normal arterial flow voids. Skull and upper cervical spine: No focal skeletal lesion. Sinuses/Orbits: Mild mucosal edema paranasal sinuses. Negative orbit Other: None IMPRESSION: Ventricular enlargement consistent with mild hydrocephalus. Progressive ventricular dilatation since the CT of 01/18/2019 Right convexity meningioma without brain edema Negative for acute or chronic ischemia. Chronic hemorrhage in the brainstem. Electronically Signed   By: Franchot Gallo M.D.   On: 04/09/2020 07:25     Medications   Scheduled Meds: . aspirin EC  81 mg Oral Daily  . Chlorhexidine Gluconate Cloth  6 each Topical Q0600  . dextromethorphan-guaiFENesin  1 tablet Oral BID  . divalproex  250 mg Oral BID  . docusate sodium  100 mg Oral BID  .  DULoxetine  20 mg Oral Daily  . heparin injection (subcutaneous)  5,000 Units Subcutaneous Q8H  . metoprolol tartrate  12.5 mg Oral BID  . OLANZapine  15 mg Oral QHS  . pantoprazole  40 mg Oral  Daily  . thiamine  50 mg Oral Daily   Continuous Infusions: . cefTRIAXone (ROCEPHIN)  IV Stopped (04/10/20 0524)       LOS: 4 days    Time spent: 25 minutes    Ezekiel Slocumb, DO [facility]  04/10/2020, 1:06 PM    If 7PM-7AM, please contact night-coverage. How to contact the [facility] Attending or Consulting provider [place] or covering provider during after hours [place], for this patient?    1. Check the care team in [facility] and look for a) attending/consulting TR[facility] provider listed and b) the [facility] team listed 2. Log into [email or web address] and use [place]'s universal password to access. If you do not have the password, please contact the hospital operator. 3. Locate the [facility] provider you are looking for under [facility] and page to a number that you can be directly reached. 4. If you still have difficulty reaching the provider, please page the [facility] (Director on Call) for the Hospitalists listed on amion for assistance.

## 2020-04-10 NOTE — Progress Notes (Addendum)
[facility]  ROUNDING NOTE   Subjective:   Patient has some confusion today.  She feels like she is ready to go home.  Denies any shortness of breath, edema.   Objective:  Vital signs in last 24 hours:  Temp:  [97.6 F (36.4 C)-98.6 F (37 C)] 98.6 F (37 C) (07/18 0853) Pulse Rate:  [72-73] 73 (07/18 0853) Resp:  [14-16] 14 (07/18 0853) BP: (119-124)/(55-70) 123/70 (07/18 0853) SpO2:  [97 %-100 %] 100 % (07/18 0853)  Weight change:  Filed Weights   04/05/20 1901 04/06/20 1752 04/07/20 3557  Weight: 71.7 kg 56.1 kg 68.9 kg    Intake/Output: I/O last 3 completed shifts: In: 100.4 [IV Piggyback:100.4] Out: -    Intake/Output this shift:  No intake/output data recorded.  Physical Exam: General: NAD,   Head: Normocephalic, atraumatic. Moist oral mucosal membranes  Eyes: Anicteric, PERRL  Neck: Supple, trachea midline  Lungs:  Clear to auscultation  Heart: Regular rate and rhythm  Abdomen:  Soft, nontender,   Extremities:  No peripheral edema.  Neurologic: Nonfocal, moving all four extremities  Skin: No lesions  Access:  left  Upper extremity AVF     Basic Metabolic Panel: Recent Labs  Lab 04/05/20 1938 04/05/20 1938 04/06/20 1555 04/07/20 0131 04/07/20 0131 04/08/20 0433 04/09/20 0543 04/10/20 0352  NA 141  --   --  139  --  137 135 132*  K 4.2  --   --  4.3  --  4.2 4.9 4.8  CL 94*  --   --  97*  --  99 95* 94*  CO2 29  --   --  29  --  26 28 24   GLUCOSE 90  --   --  80  --  88 95 92  BUN 43*  --   --  26*  --  58* 47* 65*  CREATININE 7.37*  --   --  4.96*  --  7.50* 6.17* 8.04*  CALCIUM 9.7   < >  --  9.5   < > 8.2* 8.9 8.6*  MG  --   --   --  1.8  --  1.8 1.7  --   PHOS  --   --  2.6  --   --   --   --   --    < > = values in this interval not displayed.    Liver Function Tests: Recent Labs  Lab 04/05/20 1938 04/10/20 0352  AST 69* 66*  ALT 27 29  ALKPHOS 88 53  BILITOT 0.8 1.1  PROT 8.3* 6.2*  ALBUMIN 4.1 2.7*   No  results for input(s): LIPASE, AMYLASE in the last 168 hours. Recent Labs  Lab 04/08/20 1330  AMMONIA 26    CBC: Recent Labs  Lab 04/05/20 1911 04/06/20 1555 04/07/20 0131 04/08/20 0433 04/09/20 0543  WBC 10.7* 5.8 6.7 5.3 5.4  NEUTROABS  --   --  4.3 3.3 3.3  HGB 12.8 12.0 11.8* 9.8* 10.3*  HCT 37.9 35.9* 35.1* 28.8* 31.7*  MCV 95.9 96.8 95.9 96.0 99.7  PLT 198 200 190 154 146*    Cardiac Enzymes: Recent Labs  Lab 04/05/20 1938 04/07/20 0753 04/08/20 0433  CKTOTAL 1,711* 2,226* 1,169*    BNP: Invalid input(s): POCBNP  CBG: No results for input(s): GLUCAP in the last 168 hours.  Microbiology: Results for orders placed or performed during the hospital encounter of 04/05/20  Culture, blood (routine x 2)  Status: None (Preliminary result)   Collection Time: 04/05/20 11:08 PM   Specimen: BLOOD RIGHT FOREARM  Result Value Ref Range Status   Specimen Description BLOOD RIGHT FOREARM  Final   Special Requests   Final    BOTTLES DRAWN AEROBIC ONLY Blood Culture adequate volume   Culture   Final    NO GROWTH 4 DAYS Performed at [facility], [address] [place], [place] [address]    Report Status PENDING  Incomplete  MRSA PCR Screening     Status: Abnormal   Collection Time: 04/05/20 11:15 PM   Specimen: Nasopharyngeal  Result Value Ref Range Status   MRSA by PCR POSITIVE (A) NEGATIVE Final    Comment:        The GeneXpert MRSA Assay (FDA approved for NASAL specimens only), is one component of a comprehensive MRSA colonization surveillance program. It is not intended to diagnose MRSA infection nor to guide or monitor treatment for MRSA infections. RESULT CALLED TO, READ BACK BY AND VERIFIED WITH: LILLY WALKER@1320  ON 04/07/20 BY HKP Performed at [facility], [address] [place], [place] [address]   SARS Coronavirus 2 by RT PCR (hospital order, performed in [facility] hospital lab) Nasopharyngeal Nasopharyngeal Swab      Status: None   Collection Time: 04/05/20 11:31 PM   Specimen: Nasopharyngeal Swab  Result Value Ref Range Status   SARS Coronavirus 2 NEGATIVE NEGATIVE Final    Comment: (NOTE) SARS-CoV-2 target nucleic acids are NOT DETECTED.  The SARS-CoV-2 RNA is generally detectable in upper and lower respiratory specimens during the acute phase of infection. The lowest concentration of SARS-CoV-2 viral copies this assay can detect is 250 copies / mL. A negative result does not preclude SARS-CoV-2 infection and should not be used as the sole basis for treatment or other patient management decisions.  A negative result may occur with improper specimen collection / handling, submission of specimen other than nasopharyngeal swab, presence of viral mutation(s) within the areas targeted by this assay, and inadequate number of viral copies (<250 copies / mL). A negative result must be combined with clinical observations, patient history, and epidemiological information.  Fact Sheet for Patients:   StrictlyIdeas.no  Fact Sheet for Healthcare Providers: BankingDealers.co.za  This test is not yet approved or  cleared by the [place] FDA and has been authorized for detection and/or diagnosis of SARS-CoV-2 by FDA under an Emergency Use Authorization (EUA).  This EUA will remain in effect (meaning this test can be used) for the duration of the COVID-19 declaration under Section 564(b)(1) of the Act, 21 U.S.C. section 360bbb-3(b)(1), unless the authorization is terminated or revoked sooner.  Performed at [facility], [address] [place], [place] [address]   Culture, blood (routine x 2)     Status: None (Preliminary result)   Collection Time: 04/06/20 12:27 AM   Specimen: BLOOD RIGHT HAND  Result Value Ref Range Status   Specimen Description BLOOD RIGHT HAND  Final   Special Requests   Final    BOTTLES DRAWN AEROBIC ONLY Blood Culture  adequate volume   Culture   Final    NO GROWTH 4 DAYS Performed at [facility], [address] [place], [place] [address]    Report Status PENDING  Incomplete    Coagulation Studies: No results for input(s): LABPROT, INR in the last 72 hours.  Urinalysis: No results for input(s): COLORURINE, LABSPEC, PHURINE, GLUCOSEU, HGBUR, BILIRUBINUR, KETONESUR, PROTEINUR, UROBILINOGEN, NITRITE, LEUKOCYTESUR in the  last 72 hours.  Invalid input(s): APPERANCEUR    Imaging: MR BRAIN WO CONTRAST  Result Date: 04/09/2020 CLINICAL DATA:  Encephalopathy.  ESRD. EXAM: MRI HEAD WITHOUT CONTRAST TECHNIQUE: Multiplanar, multiecho pulse sequences of the brain and surrounding structures were obtained without intravenous contrast. COMPARISON:  CT head 04/06/2020, 01/15/2019 FINDINGS: Brain: Ventricular enlargement consistent with hydrocephalus. Ventricles are larger than expected for the level of atrophy. Ventricular size has progressed since 12/28/2018. Negative for acute infarct.  No significant chronic ischemia. Right convexity meningioma measuring 17 x 16 mm with calcification on CT. No significant brain edema. There is dense ossification of the falx. There is chronic microhemorrhage in the brain stem on the left at the pontomedullary junction. Small area of microhemorrhage in the right lower pons. Vascular: Normal arterial flow voids. Skull and upper cervical spine: No focal skeletal lesion. Sinuses/Orbits: Mild mucosal edema paranasal sinuses. Negative orbit Other: None IMPRESSION: Ventricular enlargement consistent with mild hydrocephalus. Progressive ventricular dilatation since the CT of 01/04/2019 Right convexity meningioma without brain edema Negative for acute or chronic ischemia. Chronic hemorrhage in the brainstem. Electronically Signed   By: Franchot Gallo M.D.   On: 04/09/2020 07:25     Medications:   . cefTRIAXone (ROCEPHIN)  IV Stopped (04/10/20 0524)   . aspirin EC  81 mg Oral Daily   . Chlorhexidine Gluconate Cloth  6 each Topical Q0600  . dextromethorphan-guaiFENesin  1 tablet Oral BID  . divalproex  250 mg Oral BID  . docusate sodium  100 mg Oral BID  . DULoxetine  20 mg Oral Daily  . heparin injection (subcutaneous)  5,000 Units Subcutaneous Q8H  . metoprolol tartrate  12.5 mg Oral BID  . OLANZapine  15 mg Oral QHS  . pantoprazole  40 mg Oral Daily  . thiamine  50 mg Oral Daily   acetaminophen, ALPRAZolam, bisacodyl, nitroGLYCERIN, ondansetron (ZOFRAN) IV  Assessment/ Plan:  Ms. SHANTRELL PLACZEK is a 69 y.o.  female  with past medical history of schizophrenia, bipolar mood disorder, ESRD on HD MWF, anemia of chronic kidney disease, secondary hyperparathyroidism who was found lying on the ground at neighbor's house for an unknown period of time.  [facility] [place]/MWF 1. ESRD on HD MWF. Patient received hemodialysis on friday, appears to have tolerated it well.  - no urgent need for dialysis today.  - if patient is still here tomorrow, will plan for hemodialysis.   2. Anemia of Chronic kidney disease  - hemoglobin 10.3  - receiving ESA therapy as an outpatient   3. Secondary hyperparathyroidism   4. Altered mental status - management per hospitalitis team    LOS: [place] 7/18/202110:00 AM   Patient seen and examined with physician assistant. Agree with above plan.   Lavonia Dana, [facility]  7/18/20211:12 PM

## 2020-04-11 DIAGNOSIS — J189 Pneumonia, unspecified organism: Secondary | ICD-10-CM | POA: Diagnosis not present

## 2020-04-11 DIAGNOSIS — F250 Schizoaffective disorder, bipolar type: Secondary | ICD-10-CM | POA: Diagnosis not present

## 2020-04-11 DIAGNOSIS — Z992 Dependence on renal dialysis: Secondary | ICD-10-CM | POA: Diagnosis not present

## 2020-04-11 DIAGNOSIS — I214 Non-ST elevation (NSTEMI) myocardial infarction: Secondary | ICD-10-CM | POA: Diagnosis not present

## 2020-04-11 DIAGNOSIS — N186 End stage renal disease: Secondary | ICD-10-CM | POA: Diagnosis not present

## 2020-04-11 LAB — RENAL FUNCTION PANEL
Albumin: 2.4 g/dL — ABNORMAL LOW (ref 3.5–5.0)
Anion gap: 14 (ref 5–15)
BUN: 87 mg/dL — ABNORMAL HIGH (ref 8–23)
CO2: 22 mmol/L (ref 22–32)
Calcium: 8.5 mg/dL — ABNORMAL LOW (ref 8.9–10.3)
Chloride: 91 mmol/L — ABNORMAL LOW (ref 98–111)
Creatinine, Ser: 9.53 mg/dL — ABNORMAL HIGH (ref 0.44–1.00)
GFR calc Af Amer: 4 mL/min — ABNORMAL LOW (ref >60)
GFR calc non Af Amer: 4 mL/min — ABNORMAL LOW (ref >60)
Glucose, Bld: 88 mg/dL (ref 70–99)
Phosphorus: 5.9 mg/dL — ABNORMAL HIGH (ref 2.5–4.6)
Potassium: 5.6 mmol/L — ABNORMAL HIGH (ref 3.5–5.1)
Sodium: 127 mmol/L — ABNORMAL LOW (ref 135–145)

## 2020-04-11 LAB — CULTURE, BLOOD (ROUTINE X 2)
Culture: NO GROWTH
Culture: NO GROWTH
Special Requests: ADEQUATE
Special Requests: ADEQUATE

## 2020-04-11 LAB — BASIC METABOLIC PANEL
Anion gap: 11 (ref 5–15)
BUN: 90 mg/dL — ABNORMAL HIGH (ref 8–23)
CO2: 24 mmol/L (ref 22–32)
Calcium: 8.5 mg/dL — ABNORMAL LOW (ref 8.9–10.3)
Chloride: 94 mmol/L — ABNORMAL LOW (ref 98–111)
Creatinine, Ser: 9.4 mg/dL — ABNORMAL HIGH (ref 0.44–1.00)
GFR calc Af Amer: 4 mL/min — ABNORMAL LOW (ref >60)
GFR calc non Af Amer: 4 mL/min — ABNORMAL LOW (ref >60)
Glucose, Bld: 92 mg/dL (ref 70–99)
Potassium: 5.6 mmol/L — ABNORMAL HIGH (ref 3.5–5.1)
Sodium: 129 mmol/L — ABNORMAL LOW (ref 135–145)

## 2020-04-11 LAB — CBC
HCT: 26.9 % — ABNORMAL LOW (ref 36.0–46.0)
Hemoglobin: 9.4 g/dL — ABNORMAL LOW (ref 12.0–15.0)
MCH: 32.9 pg (ref 26.0–34.0)
MCHC: 34.9 g/dL (ref 30.0–36.0)
MCV: 94.1 fL (ref 80.0–100.0)
Platelets: 143 10*3/uL — ABNORMAL LOW (ref 150–400)
RBC: 2.86 MIL/uL — ABNORMAL LOW (ref 3.87–5.11)
RDW: 16.2 % — ABNORMAL HIGH (ref 11.5–15.5)
WBC: 6.1 10*3/uL (ref 4.0–10.5)
nRBC: 0 % (ref 0.0–0.2)

## 2020-04-11 LAB — OSMOLALITY: Osmolality: 301 mOsm/kg — ABNORMAL HIGH (ref 275–295)

## 2020-04-11 LAB — GLUCOSE, CAPILLARY: Glucose-Capillary: 99 mg/dL (ref 70–99)

## 2020-04-11 MED ORDER — SODIUM CHLORIDE 0.9 % IV SOLN: 100.0000 mL | INTRAVENOUS | Status: DC | PRN

## 2020-04-11 MED ORDER — EPOETIN ALFA 10000 UNIT/ML IJ SOLN
10000.0000 [IU] | INTRAMUSCULAR | Status: DC
  Administered 2020-04-11 – 2020-04-15 (×3): 10000 [IU] via INTRAVENOUS
  Filled 2020-04-11 (×3): qty 1

## 2020-04-11 MED ORDER — HEPARIN SODIUM (PORCINE) 1000 UNIT/ML DIALYSIS
1000.0000 [IU] | INTRAMUSCULAR | Status: DC | PRN
  Filled 2020-04-11: qty 1

## 2020-04-11 MED ORDER — LIDOCAINE HCL (PF) 1 % IJ SOLN
5.0000 mL | INTRAMUSCULAR | Status: DC | PRN
  Filled 2020-04-11: qty 5

## 2020-04-11 MED ORDER — LIDOCAINE-PRILOCAINE 2.5-2.5 % EX CREA
1.0000 "application " | TOPICAL_CREAM | CUTANEOUS | Status: DC | PRN
  Filled 2020-04-11: qty 5

## 2020-04-11 MED ORDER — PENTAFLUOROPROP-TETRAFLUOROETH EX AERO
1.0000 "application " | INHALATION_SPRAY | CUTANEOUS | Status: DC | PRN
  Filled 2020-04-11: qty 30

## 2020-04-11 MED ORDER — FERRIC CITRATE 1 GM 210 MG(FE) PO TABS
420.0000 mg | ORAL_TABLET | Freq: Three times a day (TID) | ORAL | Status: DC
  Administered 2020-04-11 – 2020-04-18 (×19): 420 mg via ORAL
  Filled 2020-04-11 (×23): qty 2

## 2020-04-11 MED ORDER — ALTEPLASE 2 MG IJ SOLR
2.0000 mg | Freq: Once | INTRAMUSCULAR | Status: DC | PRN
  Filled 2020-04-11: qty 2
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
## 2020-04-11 NOTE — Progress Notes (Signed)
OT Cancellation Note  Patient Details Name: REGLA FITZGIBBON MRN: [id number] DOB: 1951/09/15   Cancelled Treatment:    Reason Eval/Treat Not Completed: Medical issues which prohibited therapy. Chart reviewed. Pt noted with critically high K+ 5.6. Contraindicated for therapy at this time. Will re-attempt at later date/time as medically appropriate.   Jeni Salles, MPH, MS, OTR/L ascom [phone] 04/11/20, 4:22 PM

## 2020-04-11 NOTE — Progress Notes (Signed)
Physical Therapy Treatment Patient Details Name: Madison [name] MRN: [id number] DOB: 21-May-1951 Today's Date: 04/11/2020    History of Present Illness SPRUHA WEIGHT is a 69 y.o. female with medical history significant for schizophrenia and bipolar mood disorder, ESRD on HD MWF last dialysis 04/04/2020, with no missed dialysis sessions who was brought in after she was found lying on the ground at the neighbors house for an unknown period of time.  Most of the history is given by the sister who states that she was sleeping and that her sister would not have been outside for more than a few hours.    PT Comments    Patient received in bed, states " I keep telling them I don't want to watch TV but they keep insisting." (TV is off). Patient performed bed mobility with mod independence. Transfers with min guard, ambulated 20 feet. Limited by cognition (?) freezing type episode. She ambulated with hand held assist but with small shuffle steps despite cues. She performed seated exercises. Patient will continue to benefit from skilled PT while here to improve safety and mobility for return to prior functional level.       Follow Up Recommendations  Supervision for mobility/OOB;Other (comment) (patient confused and would be unsafe for patient to be alone upon discharge)     Equipment Recommendations  None recommended by PT    Recommendations for Other Services       Precautions / Restrictions Precautions Precautions: Fall Restrictions Weight Bearing Restrictions: No    Mobility  Bed Mobility Overal bed mobility: Modified Independent Bed Mobility: Supine to Sit;Sit to Supine     Supine to sit: Modified independent (Device/Increase time) Sit to supine: Modified independent (Device/Increase time)   General bed mobility comments: HOB elevated  Transfers Overall transfer level: Needs assistance   Transfers: Sit to/from Stand Sit to Stand: Min guard             Ambulation/Gait Ambulation/Gait assistance: Min assist Gait Distance (Feet): 20 Feet Assistive device: 1 person hand held assist Gait Pattern/deviations: Step-to pattern;Shuffle;Decreased step length - right;Decreased step length - left Gait velocity: decr   General Gait Details: Patient states she will walk if I just hold her hand. She ambulates with parkinson like gait pattern. Small shuffle steps with moments of freezing. Requires cues to continue or turn around.   Stairs             Wheelchair Mobility    Modified Rankin (Stroke Patients Only)       Balance Overall balance assessment: Needs assistance Sitting-balance support: Feet supported Sitting balance-Leahy Scale: Fair Sitting balance - Comments: posterior lean with sitting edge of bed Postural control: Posterior lean Standing balance support: Single extremity supported;During functional activity Standing balance-Leahy Scale: Fair Standing balance comment: needs at least one UE support with activity                            Cognition Arousal/Alertness: Awake/alert Behavior During Therapy: Flat affect Overall Cognitive Status: No family/caregiver present to determine baseline cognitive functioning                                        Exercises Other Exercises Other Exercises: STS x 6 reps, LAQ and seated marching x 10 reps each requires cues to perform correctly    General Comments  Pertinent Vitals/Pain Pain Assessment: No/denies pain    Home Living                      Prior Function            PT Goals (current goals can now be found in the care plan section) Acute Rehab PT Goals Patient Stated Goal: to go home PT Goal Formulation: With patient Time For Goal Achievement: 04/23/20 Potential to Achieve Goals: Fair Progress towards PT goals: Not progressing toward goals - comment (patient limited by cognition)    Frequency    Min  2X/week      PT Plan Current plan remains appropriate    Co-evaluation              AM-PAC PT "6 Clicks" Mobility   Outcome Measure  Help needed turning from your back to your side while in a flat bed without using bedrails?: A Little Help needed moving from lying on your back to sitting on the side of a flat bed without using bedrails?: A Little Help needed moving to and from a bed to a chair (including a wheelchair)?: A Little Help needed standing up from a chair using your arms (e.g., wheelchair or bedside chair)?: A Little Help needed to walk in hospital room?: A Lot Help needed climbing 3-5 steps with a railing? : A Lot 6 Click Score: 16    End of Session Equipment Utilized During Treatment: Gait belt Activity Tolerance: Patient tolerated treatment well Patient left: in bed;with bed alarm set;with call bell/phone within reach Nurse Communication: Mobility status PT Visit Diagnosis: Unsteadiness on feet (R26.81);Muscle weakness (generalized) (M62.81);History of falling (Z91.81)     Time: 2725-3664 PT Time Calculation (min) (ACUTE ONLY): 23 min  Charges:  $Gait Training: 8-22 mins $Therapeutic Exercise: 8-22 mins                     [employer], PT, [employer] 04/11/20,4:06 PM

## 2020-04-11 NOTE — Plan of Care (Signed)
Confused, denies pain and other needs, rambles constantly fragmented statements, expresses paranoia at times with suspicious comments, speaks of sister a lot with negative comments. Pt calm thus far this shift, room near nurses station, bed in low position call bell in reach. Will cont to monitor

## 2020-04-11 NOTE — Progress Notes (Signed)
[facility]  ROUNDING NOTE   Subjective:   Seen and examined on hemodialysis treatment. Tolerating treatment well. More alert and less confused this morning    HEMODIALYSIS FLOWSHEET:  Blood Flow Rate (mL/min): 400 mL/min Arterial Pressure (mmHg): -170 mmHg Venous Pressure (mmHg): 180 mmHg Transmembrane Pressure (mmHg): 60 mmHg Ultrafiltration Rate (mL/min): 840 mL/min Dialysate Flow Rate (mL/min): 600 ml/min Conductivity: Machine : 13.5 Conductivity: Machine : 13.5 Dialysis Fluid Bolus: Normal Saline Bolus Amount (mL): 250 mL    Objective:  Vital signs in last 24 hours:  Temp:  [97.6 F (36.4 C)-98.7 F (37.1 C)] 97.6 F (36.4 C) (07/19 1005) Pulse Rate:  [65-80] 71 (07/19 1115) Resp:  [13-20] 16 (07/19 1115) BP: (115-153)/(57-82) 131/75 (07/19 1115) SpO2:  [96 %-99 %] 96 % (07/19 1005)  Weight change:  Filed Weights   04/05/20 1901 04/06/20 1752 04/07/20 0632  Weight: 71.7 kg 56.1 kg 68.9 kg    Intake/Output: I/O last 3 completed shifts: In: 240 [P.O.:240] Out: -    Intake/Output this shift:  No intake/output data recorded.  Physical Exam: General: NAD,   Head: Normocephalic, atraumatic. Moist oral mucosal membranes  Eyes: Anicteric, PERRL  Neck: Supple, trachea midline  Lungs:  Clear to auscultation  Heart: Regular rate and rhythm  Abdomen:  Soft, nontender,   Extremities:  No peripheral edema.  Neurologic: Nonfocal, moving all four extremities  Skin: No lesions  Access:  left  Upper extremity AVF     Basic Metabolic Panel: Recent Labs  Lab 04/05/20 1938 04/06/20 1555 04/07/20 0131 04/07/20 0131 04/08/20 0433 04/08/20 0433 04/09/20 0543 04/10/20 0352 04/11/20 0432  NA   < >  --  139  --  137  --  135 132* 127*  129*  K   < >  --  4.3  --  4.2  --  4.9 4.8 5.6*  5.6*  CL   < >  --  97*  --  99  --  95* 94* 91*  94*  CO2   < >  --  29  --  26  --  28 24 22  24   GLUCOSE   < >  --  80  --  88  --  95 92 88  92  BUN   < >   --  26*  --  58*  --  47* 65* 87*  90*  CREATININE   < >  --  4.96*  --  7.50*  --  6.17* 8.04* 9.53*  9.40*  CALCIUM   < >  --  9.5   < > 8.2*   < > 8.9 8.6* 8.5*  8.5*  MG  --   --  1.8  --  1.8  --  1.7  --   --   PHOS  --  2.6  --   --   --   --   --   --  5.9*   < > = values in this interval not displayed.    Liver Function Tests: Recent Labs  Lab 04/05/20 1938 04/10/20 0352 04/11/20 0432  AST 69* 66*  --   ALT 27 29  --   ALKPHOS 88 53  --   BILITOT 0.8 1.1  --   PROT 8.3* 6.2*  --   ALBUMIN 4.1 2.7* 2.4*   No results for input(s): LIPASE, AMYLASE in the last 168 hours. Recent Labs  Lab 04/08/20 1330  AMMONIA 26    CBC: Recent  Labs  Lab 04/06/20 1555 04/07/20 0131 04/08/20 0433 04/09/20 0543 04/11/20 0432  WBC 5.8 6.7 5.3 5.4 6.1  NEUTROABS  --  4.3 3.3 3.3  --   HGB 12.0 11.8* 9.8* 10.3* 9.4*  HCT 35.9* 35.1* 28.8* 31.7* 26.9*  MCV 96.8 95.9 96.0 99.7 94.1  PLT 200 190 154 146* 143*    Cardiac Enzymes: Recent Labs  Lab 04/05/20 1938 04/07/20 0753 04/08/20 0433  CKTOTAL 1,711* 2,226* 1,169*    BNP: Invalid input(s): POCBNP  CBG: No results for input(s): GLUCAP in the last 168 hours.  Microbiology: Results for orders placed or performed during the hospital encounter of 04/05/20  Culture, blood (routine x 2)     Status: None   Collection Time: 04/05/20 11:08 PM   Specimen: BLOOD RIGHT FOREARM  Result Value Ref Range Status   Specimen Description BLOOD RIGHT FOREARM  Final   Special Requests   Final    BOTTLES DRAWN AEROBIC ONLY Blood Culture adequate volume   Culture   Final    NO GROWTH 5 DAYS Performed at [facility], [address] [place], [place] [address]    Report Status 04/11/2020 FINAL  Final  MRSA PCR Screening     Status: Abnormal   Collection Time: 04/05/20 11:15 PM   Specimen: Nasopharyngeal  Result Value Ref Range Status   MRSA by PCR POSITIVE (A) NEGATIVE Final    Comment:        The GeneXpert MRSA Assay  (FDA approved for NASAL specimens only), is one component of a comprehensive MRSA colonization surveillance program. It is not intended to diagnose MRSA infection nor to guide or monitor treatment for MRSA infections. RESULT CALLED TO, READ BACK BY AND VERIFIED WITH: LILLY WALKER@[email or web address]  ON 04/07/20 BY HKP Performed at [facility], [place], [place] [address]   SARS Coronavirus 2 by RT PCR (hospital order, performed in [facility] hospital lab) Nasopharyngeal Nasopharyngeal Swab     Status: None   Collection Time: 04/05/20 11:31 PM   Specimen: Nasopharyngeal Swab  Result Value Ref Range Status   SARS Coronavirus 2 NEGATIVE NEGATIVE Final    Comment: (NOTE) SARS-CoV-2 target nucleic acids are NOT DETECTED.  The SARS-CoV-2 RNA is generally detectable in upper and lower respiratory specimens during the acute phase of infection. The lowest concentration of SARS-CoV-2 viral copies this assay can detect is 250 copies / mL. A negative result does not preclude SARS-CoV-2 infection and should not be used as the sole basis for treatment or other patient management decisions.  A negative result may occur with improper specimen collection / handling, submission of specimen other than nasopharyngeal swab, presence of viral mutation(s) within the areas targeted by this assay, and inadequate number of viral copies (<250 copies / mL). A negative result must be combined with clinical observations, patient history, and epidemiological information.  Fact Sheet for Patients:   StrictlyIdeas.no  Fact Sheet for Healthcare Providers: BankingDealers.co.za  This test is not yet approved or  cleared by the [place] FDA and has been authorized for detection and/or diagnosis of SARS-CoV-2 by FDA under an Emergency Use Authorization (EUA).  This EUA will remain in effect (meaning this test can be used) for the duration of  the COVID-19 declaration under Section 564(b)(1) of the Act, 21 U.S.C. section 360bbb-3(b)(1), unless the authorization is terminated or revoked sooner.  Performed at [facility], [address] [place], [place] [address]   Culture, blood (routine x 2)  Status: None   Collection Time: 04/06/20 12:27 AM   Specimen: BLOOD RIGHT HAND  Result Value Ref Range Status   Specimen Description BLOOD RIGHT HAND  Final   Special Requests   Final    BOTTLES DRAWN AEROBIC ONLY Blood Culture adequate volume   Culture   Final    NO GROWTH 5 DAYS Performed at [facility], [address], [place], [place] [address]    Report Status 04/11/2020 FINAL  Final    Coagulation Studies: No results for input(s): LABPROT, INR in the last 72 hours.  Urinalysis: No results for input(s): COLORURINE, LABSPEC, PHURINE, GLUCOSEU, HGBUR, BILIRUBINUR, KETONESUR, PROTEINUR, UROBILINOGEN, NITRITE, LEUKOCYTESUR in the last 72 hours.  Invalid input(s): APPERANCEUR    Imaging: No results found.   Medications:   . sodium chloride    . sodium chloride     . aspirin EC  81 mg Oral Daily  . Chlorhexidine Gluconate Cloth  6 each Topical Q0600  . dextromethorphan-guaiFENesin  1 tablet Oral BID  . divalproex  250 mg Oral BID  . docusate sodium  100 mg Oral BID  . DULoxetine  20 mg Oral Daily  . heparin injection (subcutaneous)  5,000 Units Subcutaneous Q8H  . metoprolol tartrate  12.5 mg Oral BID  . OLANZapine  15 mg Oral QHS  . pantoprazole  40 mg Oral Daily  . thiamine  50 mg Oral Daily   sodium chloride, sodium chloride, acetaminophen, ALPRAZolam, alteplase, bisacodyl, heparin, lidocaine (PF), lidocaine-prilocaine, nitroGLYCERIN, ondansetron (ZOFRAN) IV, pentafluoroprop-tetrafluoroeth  Assessment/ Plan:  [name] is a 69 y.o.  female  with past medical history of schizophrenia, bipolar mood disorder, ESRD on HD MWF, anemia of chronic kidney disease, secondary  hyperparathyroidism who was found lying on the ground at neighbor's house for an unknown period of time.  [facility] (Dr. Leontine Locket) 55.5kg Left AVF  1. ESRD on HD MWF: seen and examined on hemodialysis. Tolerating treatment well.   2. Anemia of Chronic kidney disease: hemoglobin 9.4 - EPO with HD treatment.   3. Secondary hyperparathyroidism with hyperphosphatemia.  - restart Auryxia.   4. Hypertension: previously on amlodipine as well.  - continue metoprolol.    LOS: 5 Daemien Fronczak 7/19/202111:30 AM

## 2020-04-12 ENCOUNTER — Other Ambulatory Visit: Payer: Self-pay

## 2020-04-12 DIAGNOSIS — J189 Pneumonia, unspecified organism: Secondary | ICD-10-CM | POA: Diagnosis not present

## 2020-04-12 DIAGNOSIS — I214 Non-ST elevation (NSTEMI) myocardial infarction: Secondary | ICD-10-CM | POA: Diagnosis not present

## 2020-04-12 DIAGNOSIS — G9341 Metabolic encephalopathy: Secondary | ICD-10-CM | POA: Diagnosis not present

## 2020-04-12 DIAGNOSIS — Z992 Dependence on renal dialysis: Secondary | ICD-10-CM | POA: Diagnosis not present

## 2020-04-12 DIAGNOSIS — N186 End stage renal disease: Secondary | ICD-10-CM | POA: Diagnosis not present

## 2020-04-12 DIAGNOSIS — F250 Schizoaffective disorder, bipolar type: Secondary | ICD-10-CM | POA: Diagnosis not present

## 2020-04-12 LAB — T4, FREE: Free T4: 0.77 ng/dL (ref 0.61–1.12)

## 2020-04-12 LAB — BASIC METABOLIC PANEL
Anion gap: 15 (ref 5–15)
BUN: 55 mg/dL — ABNORMAL HIGH (ref 8–23)
CO2: 24 mmol/L (ref 22–32)
Calcium: 9 mg/dL (ref 8.9–10.3)
Chloride: 94 mmol/L — ABNORMAL LOW (ref 98–111)
Creatinine, Ser: 7.23 mg/dL — ABNORMAL HIGH (ref 0.44–1.00)
GFR calc Af Amer: 6 mL/min — ABNORMAL LOW (ref >60)
GFR calc non Af Amer: 5 mL/min — ABNORMAL LOW (ref >60)
Glucose, Bld: 75 mg/dL (ref 70–99)
Potassium: 6 mmol/L — ABNORMAL HIGH (ref 3.5–5.1)
Sodium: 133 mmol/L — ABNORMAL LOW (ref 135–145)

## 2020-04-12 LAB — CBC
HCT: 31 % — ABNORMAL LOW (ref 36.0–46.0)
Hemoglobin: 10.4 g/dL — ABNORMAL LOW (ref 12.0–15.0)
MCH: 33 pg (ref 26.0–34.0)
MCHC: 33.5 g/dL (ref 30.0–36.0)
MCV: 98.4 fL (ref 80.0–100.0)
Platelets: 133 10*3/uL — ABNORMAL LOW (ref 150–400)
RBC: 3.15 MIL/uL — ABNORMAL LOW (ref 3.87–5.11)
RDW: 16.3 % — ABNORMAL HIGH (ref 11.5–15.5)
WBC: 6.5 10*3/uL (ref 4.0–10.5)
nRBC: 0 % (ref 0.0–0.2)

## 2020-04-12 LAB — VITAMIN B1: Vitamin B1 (Thiamine): 341.8 nmol/L — ABNORMAL HIGH (ref 66.5–200.0)

## 2020-04-12 LAB — MAGNESIUM: Magnesium: 1.7 mg/dL (ref 1.7–2.4)

## 2020-04-12 LAB — TSH: TSH: 5.097 u[IU]/mL — ABNORMAL HIGH (ref 0.350–4.500)

## 2020-04-12 LAB — PARATHYROID HORMONE, INTACT (NO CA): PTH: 449 pg/mL — ABNORMAL HIGH (ref 15–65)

## 2020-04-12 LAB — POTASSIUM: Potassium: 6 mmol/L — ABNORMAL HIGH (ref 3.5–5.1)

## 2020-04-12 MED ORDER — MUPIROCIN 2 % EX OINT
TOPICAL_OINTMENT | Freq: Two times a day (BID) | CUTANEOUS | Status: DC
  Administered 2020-04-17: 1 via NASAL
  Filled 2020-04-12: qty 22

## 2020-04-12 MED ORDER — SODIUM ZIRCONIUM CYCLOSILICATE 10 G PO PACK
10.0000 g | PACK | Freq: Once | ORAL | Status: AC
  Administered 2020-04-12: 10 g via ORAL
  Filled 2020-04-12: qty 1

## 2020-04-12 NOTE — TOC Initial Note (Signed)
Transition of Care [facility]) - Initial/Assessment Note    Patient Details  Name: Madison [name] MRN: [id number] Date of Birth: 1951/09/17  Transition of Care [facility]) CM/SW Contact:    Shelbie Hutching, RN Phone Number: 04/12/2020, 3:46 PM  Clinical Narrative:                 Patient is from home with her sister, [name].  Patient is here in the hospital after an overdose that she reports she did for the attention.  Per the MD patient does not have competency at this time.  RNCM spoke with patient's sister, [name], and she reports that the hospital needs to find somewhere for her to go.  [name] does not want to take her back home, [name] reports that the patient does not listen to her, she states "she is not my responsibility and I am tired of taking her abuse."  RNCM will attempt to find a group home in [facility] that will accept the patient.  Patient goes to dialysis in [address] MWF at [facility].    Expected Discharge Plan: Group Home Barriers to Discharge: Continued Medical Work up   Patient Goals and CMS Choice Patient states their goals for this hospitalization and ongoing recovery are:: Patient wants to go back home      Expected Discharge Plan and Services Expected Discharge Plan: Group Home   Discharge Planning Services: CM Consult   Living arrangements for the past 2 months: Single Family Home                                      Prior Living Arrangements/Services Living arrangements for the past 2 months: Single Family Home Lives with:: Siblings (sister [place]) Patient language and need for interpreter reviewed:: Yes Do you feel safe going back to the place where you live?: Yes      Need for Family Participation in Patient Care: Yes (Comment) (schizophrenia) Care giver support system in place?: Yes (comment) (sisters)   Criminal Activity/Legal Involvement Pertinent to Current Situation/Hospitalization: No - Comment as needed  Activities of Daily Living       Permission Sought/Granted Permission sought to share information with : Case Manager, Family Supports Permission granted to share information with : Yes, Verbal Permission Granted  Share Information with NAME: [name]     Permission granted to share info w Relationship: sister     Emotional Assessment Appearance:: Appears stated age Attitude/Demeanor/Rapport: Inconsistent Affect (typically observed): Frustrated, Overwhelmed Orientation: : Oriented to Self Alcohol / Substance Use: Not Applicable Psych Involvement: Yes (comment)  Admission diagnosis:  NSTEMI (non-ST elevated myocardial infarction) ([place]) [I21.4] Non-traumatic rhabdomyolysis [M62.82] Altered mental status, unspecified altered mental status type [Q76.19] Acute metabolic encephalopathy [J09.32] Sepsis, due to unspecified organism, unspecified whether acute organ dysfunction present [facility]) [A41.9] Patient Active Problem List   Diagnosis Date Noted  . Acute metabolic encephalopathy 67/08/4579  . Rhabdomyolysis 04/06/2020  . Elevated troponin 04/06/2020  . Pneumonia 04/06/2020  . Lactic acidosis 04/06/2020  . Suspected Sepsis ([place]) 04/06/2020  . Possible NSTEMI (non-ST elevated myocardial infarction) ([place]) 04/06/2020  .  Suspected Overdose, undetermined intent, initial encounter 04/06/2020  . Schizoaffective disorder, bipolar type ([place])   . Bipolar affective disorder, current episode manic with psychotic symptoms ([place]) 12/25/2018  . ESRD on hemodialysis ([place]) 12/25/2018  . History of adenomatous polyp of colon 11/20/2018  . Loss of weight 10/17/2017  . Hypernatremia 04/17/2016  .  CKD (chronic kidney disease) stage 3, GFR 30-59 ml/min 04/16/2016  . Normocytic anemia 04/16/2016  . Meningioma ([place]) 04/16/2016  . Acute psychosis ([place]) 04/15/2016  . Hyponatremia 04/15/2016  . AKI (acute kidney injury) ([place]) 04/15/2016  . Special screening for malignant neoplasms, colon   . Thrombocytopenia ([place]) 01/05/2016  .  Altered mental status 04/19/2015   PCP:  Vidal Schwalbe, MD Pharmacy:   [place], [place] - [address] [place] [address] Phone: [phone] Fax: [phone]  [place], [place] [address] Phone: [phone] Fax: [phone]     Social Determinants of Health (SDOH) Interventions    Readmission Risk Interventions No flowsheet data found.

## 2020-04-12 NOTE — Progress Notes (Signed)
OT Cancellation Note  Patient Details Name: Madison [name] MRN: [id number] DOB: [birth year]   Cancelled Treatment:    Reason Eval/Treat Not Completed: Medical issues which prohibited therapy. Chart reviewed. Pt noted with K+ 6.0 today. Per Nephrology, to start pt on Lokelma for potassium. Will continue to hold, per therapy guidelines, and re-attempt once pt is medically appropriate.   Jeni Salles, MPH, MS, OTR/L ascom [phone] 04/12/20, 11:06 AM

## 2020-04-12 NOTE — Progress Notes (Signed)
PROGRESS NOTE    Madison [name]   YHC:[id number]  DOB: September 09, 1951  PCP: Vidal Schwalbe, MD    DOA: 04/05/2020 LOS: 6   Brief Narrative   Madison [name] is a 69 y.o. female with medical history significant for schizoaffective disorder bipolar type, ESRD on HD MWF last dialysis 04/04/2020, with no missed dialysis sessions who was brought in after she was found lying on the ground at the neighbors house for an unknown period of time and with empty pill packs found by patient's sister but unknown if patient overdosed.  Patient later admitted to taking all the pills she could find because she was "tired of it all".  Sister also reported recent increased confusion with the patient, but no recent illnesses, fevers/chills or complaints of other symptoms.  In the ED, patient awake but confused.  Normal vitals.  EKG normal sinus rhythm and no acute ST-T wave changes.  Pertinent labs include WBC 10.7k, CK 1711, troponin 158>>173.  Lactic acid 2.8 >> 2.9.  Normal depakote level.  CXR showed mid and lower lung zones opacities concerning for pneumonia including atypical viral infection vs atelectasis.  Covid-19 PCR negative.  She was fluid resuscitated and treated with broad spectrum antibiotics in the ED per sepsis protocol.  Admitted to hospitalist service.  Cardiology consulted.      Assessment & Plan   Principal Problem:   Acute metabolic encephalopathy Active Problems:   ESRD on hemodialysis (HCC)   Schizoaffective disorder, bipolar type (HCC)   Rhabdomyolysis   Elevated troponin   Pneumonia   Lactic acidosis   Suspected Sepsis ([place])   Possible NSTEMI (non-ST elevated myocardial infarction) ([place])    Suspected Overdose, undetermined intent, initial encounter   Hyperkalemia - K 6.0 this AM.  Lokelma.  12-lead EKG.  Recheck this afternoon.  Hyponatremia - not present on admission, improved after dialysis 7/19.  Na trend: 139>>137>>135>>132>>129>>133(after HD yesterday).  Hypertonic, serum  osmolality is 301.  Suspect poor oral intake.  Anuric dialysis patient, will hold off on fluids.  TSH high but normal T4.  Repeat BMP in AM.  Nephrology is following.  Acute metabolic encephalopathy / Decompensated Schizoaffective and Bipolar Disorders POA, persistent, still disoriented to place at times, not at her baseline.  Having hallucinations.   Patient was found with empty pill packets, initially admitted to attempted overdose but later reported it was attention seeking behavior due to difficulty with her sister.  CT head negative for acute findings.  Ammonia normal.  B12 high.  Thiamine level pending.  MRI brain without contrast.  Neurology consulted, MRI finding of enlarged ventricles consistent with central atrophy.  This is likely psych-related issue.  Psychiatry has followed and no longer plans for [facility] admission following acute stay.    Suicide attempt by intentional overdose- Psychiatry is consulted.  Discontinued 1:1 sitter.  No longer considered suicidal, her overdose was attention seeking behavior.  Schizoaffective disorder, bipolar type - Depakote level within normal limits on admission.  Psychiatry is consulted given suicide attempt, was considered attention seeking.  Continue Zyprexa, Depakote.  Cymbalta was added.   TOC attempting to place at group home.  Patient wants to go home, but sister says unable to care for her any longer.  Patient requires more assistance and supervision than family is able to provide.    Sepsis due to Community-acquired PNA / Lactic acidosis - Resolved, completed antibiotics.  Sepsis POA with elevated lactate, mild leukocytosis, but stable vitals.  Fluid resuscitated and treated with broad-spectrum  antibiotics in the ED.  Lactic acidosis resolved.  Treated with IV Rocephin and Zithromax followed by Augmentin.  Cultures negative.  Acute anemia / Anemia of chronic disease - acute not present on admission.  Initial Hbg 12.8 >>12.0>>11.8>>9.8>>10.3.  No  evidence of bleeding.  Anemia panel showed normal iron, elevated ferritin and TIBC, normal folate, elevated B12 - all consistent with anemia of chronic disease.  Check FOBT, not yet sent.  Monitor for bleeding.  Daily CBC.  Transfuse if Hbg < 7.0 (with dialysis).  Rhabdomyolysis, acute, nontraumatic - POA with CK over 1500 >>2226>>1169. Unable to give IV fluids in dialysis patient, will overload her.  Mgmt by hemodialysis.  Elevated troponin - POA with troponin's 158 >> 173 >> 54.  Cardiology consulted.  Feels T wave changes were related to HTN urgency and troponin elevation due to demand ischemia.  Patient free of chest pain.  Heparin drip stopped.   Continue ASA, stain, beta blocker.  ESRD on hemodialysis M/W/F - had not missed any sessions except day of admission.  Nephrology consulted for dialysis.  GERD - continue Protonix  Neuropathy - hold gabapentin given AMS  DVT prophylaxis: heparin injection 5,000 Units Start: 04/07/20 2200   Diet:  Diet Orders (From admission, onward)    Start     Ordered   04/12/20 0727  Diet renal with fluid restriction Fluid restriction: 1200 mL Fluid; Room service appropriate? Yes; Fluid consistency: Thin  Diet effective now       Question Answer Comment  Fluid restriction: 1200 mL Fluid   Room service appropriate? Yes   Fluid consistency: Thin      04/12/20 0726            Code Status: Full Code    Subjective 04/12/20    Patient seen up in chair today.  She reports feeling okay.  Wants to go home.  Denies fever/chills, chest pain SOB, palpitations.  Oriented today.     Disposition Plan & Communication   Status is: Inpatient  Remains inpatient appropriate because unsafe discharge.  Hyperkalemia.  Requires placement, family unable to care for her.  Dispo: The patient is from: Home              Anticipated d/c is to: Group Home              Anticipated d/c date is: 3 days+              Medically stable for discharge: No    Family  Communication: spoke with patient's sister [name] this afternoon.  She is unable to take care of patient at home any longer, patient requires too much assistance and supervision, cannot be left home alone.        Consults, Procedures, Significant Events   Consultants:   Cardiology  Nephrology  Psychiatry  Procedures:   Dialysis  Antimicrobials:   Augmentin 7/18 >> 7/19  Rocephin and Zithromax 7/15 >>7/18  Vancomycin, Cefepime, Flagyl 7/14 >> 7/15     Objective   Vitals:   04/11/20 1511 04/11/20 1952 04/11/20 2352 04/12/20 0623  BP: 124/63 (!) 138/57 (!) 122/54 (!) 148/66  Pulse: 72 70 72 75  Resp: 16 16 17 17   Temp: 98.3 F (36.8 C) 98.3 F (36.8 C) 98.3 F (36.8 C) 98.1 F (36.7 C)  TempSrc: Oral     SpO2: 99% 100% 99% 99%  Weight:      Height:        Intake/Output Summary (Last 24 hours)  at 04/12/2020 0734 Last data filed at 04/11/2020 1305 Gross per 24 hour  Intake --  Output 2000 ml  Net -2000 ml   Filed Weights   04/05/20 1901 04/06/20 1752 04/07/20 3825  Weight: 71.7 kg 56.1 kg 68.9 kg    Physical Exam:  General exam: awake and alert, no acute distress Respiratory system: CTAB, no wheezes or rhonchi, normal respiratory effort. Cardiovascular system: normal S1/S2, RRR, no pedal edema.   Central nervous system: No focal neurologic deficits, normal speech Extremities: moves all, no edema, normal tone Psychiatry: normal mood, congruent affect, tangential, no apparent hallucinations  Labs   Data Reviewed: I have personally reviewed following labs and imaging studies  CBC: Recent Labs  Lab 04/07/20 0131 04/08/20 0433 04/09/20 0543 04/11/20 0432 04/12/20 0459  WBC 6.7 5.3 5.4 6.1 6.5  NEUTROABS 4.3 3.3 3.3  --   --   HGB 11.8* 9.8* 10.3* 9.4* 10.4*  HCT 35.1* 28.8* 31.7* 26.9* 31.0*  MCV 95.9 96.0 99.7 94.1 98.4  PLT 190 154 146* 143* 053*   Basic Metabolic Panel: Recent Labs  Lab 04/05/20 1938 04/06/20 1555 04/07/20 0131  04/07/20 0131 04/08/20 0433 04/09/20 0543 04/10/20 0352 04/11/20 0432 04/12/20 0459  NA   < >  --  139   < > 137 135 132* 127*  129* 133*  K   < >  --  4.3   < > 4.2 4.9 4.8 5.6*  5.6* 6.0*  CL   < >  --  97*   < > 99 95* 94* 91*  94* 94*  CO2   < >  --  29   < > 26 28 24 22  24 24   GLUCOSE   < >  --  80   < > 88 95 92 88  92 75  BUN   < >  --  26*   < > 58* 47* 65* 87*  90* 55*  CREATININE   < >  --  4.96*   < > 7.50* 6.17* 8.04* 9.53*  9.40* 7.23*  CALCIUM   < >  --  9.5   < > 8.2* 8.9 8.6* 8.5*  8.5* 9.0  MG  --   --  1.8  --  1.8 1.7  --   --  1.7  PHOS  --  2.6  --   --   --   --   --  5.9*  --    < > = values in this interval not displayed.   GFR: Estimated Creatinine Clearance: 6.4 mL/min (A) (by C-G formula based on SCr of 7.23 mg/dL (H)). Liver Function Tests: Recent Labs  Lab 04/05/20 1938 04/10/20 0352 04/11/20 0432  AST 69* 66*  --   ALT 27 29  --   ALKPHOS 88 53  --   BILITOT 0.8 1.1  --   PROT 8.3* 6.2*  --   ALBUMIN 4.1 2.7* 2.4*   No results for input(s): LIPASE, AMYLASE in the last 168 hours. Recent Labs  Lab 04/08/20 1330  AMMONIA 26   Coagulation Profile: Recent Labs  Lab 04/06/20 0146  INR 1.0   Cardiac Enzymes: Recent Labs  Lab 04/05/20 1938 04/07/20 0753 04/08/20 0433  CKTOTAL 1,711* 2,226* 1,169*   BNP (last 3 results) No results for input(s): PROBNP in the last 8760 hours. HbA1C: No results for input(s): HGBA1C in the last 72 hours. CBG: Recent Labs  Lab 04/11/20 2054  GLUCAP 99   Lipid Profile:  No results for input(s): CHOL, HDL, LDLCALC, TRIG, CHOLHDL, LDLDIRECT in the last 72 hours. Thyroid Function Tests: Recent Labs    04/12/20 0459  TSH 5.097*   Anemia Panel: No results for input(s): VITAMINB12, FOLATE, FERRITIN, TIBC, IRON, RETICCTPCT in the last 72 hours. Sepsis Labs: Recent Labs  Lab 04/06/20 0027 04/06/20 0227 04/07/20 0753  LATICACIDVEN 2.8* 2.9* 1.4    Recent Results (from the past 240  hour(s))  Culture, blood (routine x 2)     Status: None   Collection Time: 04/05/20 11:08 PM   Specimen: BLOOD RIGHT FOREARM  Result Value Ref Range Status   Specimen Description BLOOD RIGHT FOREARM  Final   Special Requests   Final    BOTTLES DRAWN AEROBIC ONLY Blood Culture adequate volume   Culture   Final    NO GROWTH 5 DAYS Performed at [facility], [address] [place], [place] [address]    Report Status 04/11/2020 FINAL  Final  MRSA PCR Screening     Status: Abnormal   Collection Time: 04/05/20 11:15 PM   Specimen: Nasopharyngeal  Result Value Ref Range Status   MRSA by PCR POSITIVE (A) NEGATIVE Final    Comment:        The GeneXpert MRSA Assay (FDA approved for NASAL specimens only), is one component of a comprehensive MRSA colonization surveillance program. It is not intended to diagnose MRSA infection nor to guide or monitor treatment for MRSA infections. RESULT CALLED TO, READ BACK BY AND VERIFIED WITH: LILLY WALKER@1320  ON 04/07/20 BY HKP Performed at [facility], [address] [place], [place] [address]   SARS Coronavirus 2 by RT PCR (hospital order, performed in [facility] hospital lab) Nasopharyngeal Nasopharyngeal Swab     Status: None   Collection Time: 04/05/20 11:31 PM   Specimen: Nasopharyngeal Swab  Result Value Ref Range Status   SARS Coronavirus 2 NEGATIVE NEGATIVE Final    Comment: (NOTE) SARS-CoV-2 target nucleic acids are NOT DETECTED.  The SARS-CoV-2 RNA is generally detectable in upper and lower respiratory specimens during the acute phase of infection. The lowest concentration of SARS-CoV-2 viral copies this assay can detect is 250 copies / mL. A negative result does not preclude SARS-CoV-2 infection and should not be used as the sole basis for treatment or other patient management decisions.  A negative result may occur with improper specimen collection / handling, submission of specimen other than nasopharyngeal  swab, presence of viral mutation(s) within the areas targeted by this assay, and inadequate number of viral copies (<250 copies / mL). A negative result must be combined with clinical observations, patient history, and epidemiological information.  Fact Sheet for Patients:   StrictlyIdeas.no  Fact Sheet for Healthcare Providers: BankingDealers.co.za  This test is not yet approved or  cleared by the [place] FDA and has been authorized for detection and/or diagnosis of SARS-CoV-2 by FDA under an Emergency Use Authorization (EUA).  This EUA will remain in effect (meaning this test can be used) for the duration of the COVID-19 declaration under Section 564(b)(1) of the Act, 21 U.S.C. section 360bbb-3(b)(1), unless the authorization is terminated or revoked sooner.  Performed at [facility], [address] [place], [place] [address]   Culture, blood (routine x 2)     Status: None   Collection Time: 04/06/20 12:27 AM   Specimen: BLOOD RIGHT HAND  Result Value Ref Range Status   Specimen Description BLOOD RIGHT HAND  Final   Special Requests  Final    BOTTLES DRAWN AEROBIC ONLY Blood Culture adequate volume   Culture   Final    NO GROWTH 5 DAYS Performed at [facility], [address] [place], [place] [address]    Report Status 04/11/2020 FINAL  Final      Imaging Studies   No results found.   Medications   Scheduled Meds: . aspirin EC  81 mg Oral Daily  . Chlorhexidine Gluconate Cloth  6 each Topical Q0600  . dextromethorphan-guaiFENesin  1 tablet Oral BID  . divalproex  250 mg Oral BID  . docusate sodium  100 mg Oral BID  . DULoxetine  20 mg Oral Daily  . epoetin (EPOGEN/PROCRIT) injection  10,000 Units Intravenous Q M,W,F-HD  . ferric citrate  420 mg Oral TID WC  . heparin injection (subcutaneous)  5,000 Units Subcutaneous Q8H  . metoprolol tartrate  12.5 mg Oral BID  . OLANZapine  15 mg  Oral QHS  . pantoprazole  40 mg Oral Daily  . sodium zirconium cyclosilicate  10 g Oral Once  . thiamine  50 mg Oral Daily   Continuous Infusions:      LOS: 6 days    Time spent: 30 minutes    Ezekiel Slocumb, DO [facility]  04/12/2020, 7:34 AM    If 7PM-7AM, please contact night-coverage. How to contact the [facility] Attending or Consulting provider [place] or covering provider during after hours [place], for this patient?    1. Check the care team in [facility] and look for a) attending/consulting TR[facility] provider listed and b) the [facility] team listed 2. Log into [email or web address] and use [place]'s universal password to access. If you do not have the password, please contact the hospital operator. 3. Locate the [facility] provider you are looking for under [facility] and page to a number that you can be directly reached. 4. If you still have difficulty reaching the provider, please page the [facility] (Director on Call) for the Hospitalists listed on amion for assistance.

## 2020-04-12 NOTE — Progress Notes (Signed)
[facility]  ROUNDING NOTE   Subjective:   Seems to be less confused today. Hemodialysis treatment yesterday. Tolerated treatment well. UF of  2 liters.  K 6 this morning. Started on lokelma. Changed to a low potassium diet.   Objective:  Vital signs in last 24 hours:  Temp:  [97.9 F (36.6 C)-98.3 F (36.8 C)] 97.9 F (36.6 C) (07/20 0741) Pulse Rate:  [70-81] 72 (07/20 0741) Resp:  [14-22] 16 (07/20 0741) BP: (108-148)/(54-93) 120/56 (07/20 0741) SpO2:  [99 %-100 %] 100 % (07/20 0741)  Weight change:  Filed Weights   04/05/20 1901 04/06/20 1752 04/07/20 3329  Weight: 71.7 kg 56.1 kg 68.9 kg    Intake/Output: I/O last 3 completed shifts: In: -  Out: 2000 [Other:2000]   Intake/Output this shift:  Total I/O In: 240 [P.O.:240] Out: -   Physical Exam: General: NAD,   Head: Normocephalic, atraumatic. Moist oral mucosal membranes  Eyes: Anicteric, PERRL  Neck: Supple, trachea midline  Lungs:  Clear to auscultation  Heart: Regular rate and rhythm  Abdomen:  Soft, nontender,   Extremities:  No peripheral edema.  Neurologic: Nonfocal, moving all four extremities  Skin: No lesions  Access:  left  Upper extremity AVF     Basic Metabolic Panel: Recent Labs  Lab 04/05/20 1938 04/06/20 1555 04/07/20 0131 04/07/20 0131 04/08/20 5188 04/08/20 4166 04/09/20 0543 04/09/20 0543 04/10/20 0352 04/11/20 0432 04/12/20 0459  NA   < >  --  139   < > 137  --  135  --  132* 127*  129* 133*  K   < >  --  4.3   < > 4.2  --  4.9  --  4.8 5.6*  5.6* 6.0*  CL   < >  --  97*   < > 99  --  95*  --  94* 91*  94* 94*  CO2   < >  --  29   < > 26  --  28  --  24 22  24 24   GLUCOSE   < >  --  80   < > 88  --  95  --  92 88  92 75  BUN   < >  --  26*   < > 58*  --  47*  --  65* 87*  90* 55*  CREATININE   < >  --  4.96*   < > 7.50*  --  6.17*  --  8.04* 9.53*  9.40* 7.23*  CALCIUM   < >  --  9.5   < > 8.2*   < > 8.9   < > 8.6* 8.5*  8.5* 9.0  MG  --   --  1.8  --   1.8  --  1.7  --   --   --  1.7  PHOS  --  2.6  --   --   --   --   --   --   --  5.9*  --    < > = values in this interval not displayed.    Liver Function Tests: Recent Labs  Lab 04/05/20 1938 04/10/20 0352 04/11/20 0432  AST 69* 66*  --   ALT 27 29  --   ALKPHOS 88 53  --   BILITOT 0.8 1.1  --   PROT 8.3* 6.2*  --   ALBUMIN 4.1 2.7* 2.4*   No results for input(s): LIPASE, AMYLASE in the last  168 hours. Recent Labs  Lab 04/08/20 1330  AMMONIA 26    CBC: Recent Labs  Lab 04/07/20 0131 04/08/20 0433 04/09/20 0543 04/11/20 0432 04/12/20 0459  WBC 6.7 5.3 5.4 6.1 6.5  NEUTROABS 4.3 3.3 3.3  --   --   HGB 11.8* 9.8* 10.3* 9.4* 10.4*  HCT 35.1* 28.8* 31.7* 26.9* 31.0*  MCV 95.9 96.0 99.7 94.1 98.4  PLT 190 154 146* 143* 133*    Cardiac Enzymes: Recent Labs  Lab 04/05/20 1938 04/07/20 0753 04/08/20 0433  CKTOTAL 1,711* 2,226* 1,169*    BNP: Invalid input(s): POCBNP  CBG: Recent Labs  Lab 04/11/20 2054  GLUCAP 44    Microbiology: Results for orders placed or performed during the hospital encounter of 04/05/20  Culture, blood (routine x 2)     Status: None   Collection Time: 04/05/20 11:08 PM   Specimen: BLOOD RIGHT FOREARM  Result Value Ref Range Status   Specimen Description BLOOD RIGHT FOREARM  Final   Special Requests   Final    BOTTLES DRAWN AEROBIC ONLY Blood Culture adequate volume   Culture   Final    NO GROWTH 5 DAYS Performed at [facility], [address] [place], [place] [address]    Report Status 04/11/2020 FINAL  Final  MRSA PCR Screening     Status: Abnormal   Collection Time: 04/05/20 11:15 PM   Specimen: Nasopharyngeal  Result Value Ref Range Status   MRSA by PCR POSITIVE (A) NEGATIVE Final    Comment:        The GeneXpert MRSA Assay (FDA approved for NASAL specimens only), is one component of a comprehensive MRSA colonization surveillance program. It is not intended to diagnose MRSA infection nor to guide  or monitor treatment for MRSA infections. RESULT CALLED TO, READ BACK BY AND VERIFIED WITH: LILLY WALKER@1320  ON 04/07/20 BY HKP Performed at [facility], [address] [place], [place] [address]   SARS Coronavirus 2 by RT PCR (hospital order, performed in [facility] hospital lab) Nasopharyngeal Nasopharyngeal Swab     Status: None   Collection Time: 04/05/20 11:31 PM   Specimen: Nasopharyngeal Swab  Result Value Ref Range Status   SARS Coronavirus 2 NEGATIVE NEGATIVE Final    Comment: (NOTE) SARS-CoV-2 target nucleic acids are NOT DETECTED.  The SARS-CoV-2 RNA is generally detectable in upper and lower respiratory specimens during the acute phase of infection. The lowest concentration of SARS-CoV-2 viral copies this assay can detect is 250 copies / mL. A negative result does not preclude SARS-CoV-2 infection and should not be used as the sole basis for treatment or other patient management decisions.  A negative result may occur with improper specimen collection / handling, submission of specimen other than nasopharyngeal swab, presence of viral mutation(s) within the areas targeted by this assay, and inadequate number of viral copies (<250 copies / mL). A negative result must be combined with clinical observations, patient history, and epidemiological information.  Fact Sheet for Patients:   StrictlyIdeas.no  Fact Sheet for Healthcare Providers: BankingDealers.co.za  This test is not yet approved or  cleared by the [place] FDA and has been authorized for detection and/or diagnosis of SARS-CoV-2 by FDA under an Emergency Use Authorization (EUA).  This EUA will remain in effect (meaning this test can be used) for the duration of the COVID-19 declaration under Section 564(b)(1) of the Act, 21 U.S.C. section 360bbb-3(b)(1), unless the authorization is terminated or revoked sooner.  Performed at [facility], [address] [place], [place] [address]   Culture, blood (routine x 2)     Status: None   Collection Time: 04/06/20 12:27 AM   Specimen: BLOOD RIGHT HAND  Result Value Ref Range Status   Specimen Description BLOOD RIGHT HAND  Final   Special Requests   Final    BOTTLES DRAWN AEROBIC ONLY Blood Culture adequate volume   Culture   Final    NO GROWTH 5 DAYS Performed at [facility], [address], [place], [place] [address]    Report Status 04/11/2020 FINAL  Final    Coagulation Studies: No results for input(s): LABPROT, INR in the last 72 hours.  Urinalysis: No results for input(s): COLORURINE, LABSPEC, PHURINE, GLUCOSEU, HGBUR, BILIRUBINUR, KETONESUR, PROTEINUR, UROBILINOGEN, NITRITE, LEUKOCYTESUR in the last 72 hours.  Invalid input(s): APPERANCEUR    Imaging: No results found.   Medications:    . aspirin EC  81 mg Oral Daily  . Chlorhexidine Gluconate Cloth  6 each Topical Q0600  . dextromethorphan-guaiFENesin  1 tablet Oral BID  . divalproex  250 mg Oral BID  . docusate sodium  100 mg Oral BID  . DULoxetine  20 mg Oral Daily  . epoetin (EPOGEN/PROCRIT) injection  10,000 Units Intravenous Q M,W,F-HD  . ferric citrate  420 mg Oral TID WC  . heparin injection (subcutaneous)  5,000 Units Subcutaneous Q8H  . metoprolol tartrate  12.5 mg Oral BID  . OLANZapine  15 mg Oral QHS  . pantoprazole  40 mg Oral Daily  . thiamine  50 mg Oral Daily   acetaminophen, ALPRAZolam, bisacodyl, nitroGLYCERIN, ondansetron (ZOFRAN) IV  Assessment/ Plan:  Madison [name] is a 69 y.o. black female  with end stage renal disease on hemodialysis, schizophrenia, hypertension, who was admitted to [facility] on 04/05/2020 for NSTEMI (non-ST elevated myocardial infarction) ([place]) [I21.4] Non-traumatic rhabdomyolysis [M62.82] Altered mental status, unspecified altered mental status type [H20.94] Acute metabolic encephalopathy [B09.62] Sepsis, due to unspecified organism,  unspecified whether acute organ dysfunction present ([place]) [A41.9]   [facility] (Dr. Leontine Locket) 55.5kg Left AVF  1. ESRD on HD MWF: hemodialysis for tomorrow. 2K bath  2. Hyperkalemia - low potassium diet - lokelma - she has been on this agent in the past.    2. Anemia of Chronic kidney disease: hemoglobin 10.4 - EPO with HD treatment.   3. Secondary hyperparathyroidism with hyperphosphatemia.  Lorin Picket.   4. Hypertension: previously on amlodipine as well. Well controlled.  - continue metoprolol.    LOS: 6 Omni [place] 7/20/202110:37 AM

## 2020-04-12 NOTE — Consult Note (Signed)
[facility] Face-to-Face Psychiatry Consult   Reason for Consult:  Overdose Referring Physician:  Dr. Damita Dunnings Patient Identification: Madison [name] MRN:  [id number] Principal Diagnosis: Acute metabolic encephalopathy Diagnosis:  Principal Problem:   Acute metabolic encephalopathy Active Problems:   Schizoaffective disorder, bipolar type ([place])   Rhabdomyolysis   Suspected Sepsis ([place])   ESRD on hemodialysis ([place])   Elevated troponin   Pneumonia   Lactic acidosis   Possible NSTEMI (non-ST elevated myocardial infarction) ([place])    Suspected Overdose, undetermined intent, initial encounter   Total Time spent with patient: 25 minutes  Subjective: "I had voices a long time ago."  Patient seen and evaluated in person by this provider.  Reports she heard voices in the past, none now.  Not having visual hallucinations either, not responding to internal stimuli on assessment.  She stated she was at [facility] and was here before.  States her psychiatric history of seeing Dr. Loni Muse Darleene Cleaver, outpatient psychiatrist) and he was "a nice doctor, he had my best intentions, I liked him.  Then, my family had me go see this doctor in our town."  No suicidal/homicidal ideations, hallucinations, mania, paranoia, or other concerning psychiatric issues on assessment besides  Her self report of her "nerves" at times.  Based on her assessment, no medication changes warranted or inpatient psychiatric hospitalization.  HPI: Madison [name] is a 69 y.o. female patient with medical history significant for schizophrenia and bipolar mood disorder, ESRD on HD MWF last dialysis 04/04/2020, with no missed dialysis sessions who was brought in after she was found lying on the ground at the neighbors house for an unknown period of time.   Consult from Dr Leverne Humbles on 7/18: Psychiatry consultation is requested for evaluation of mental status and suicidal intent.  Past Psychiatric History: Schizophrenia and bipolar mood disorder  On  evaluation today, patient is awake, alert oriented to person, place, date and situation.  Her speech and movements are slow.  Patient describes that she has been eating and sleeping well and she denies problems with concentration or energy.  She states her mood as, "pretty good."  She specifically denies any suicidal ideation, plan, or intent.  She denies homicidal ideation.  She denies any auditory or visual hallucinations, and does not appear to be responding to internal stimuli.  She denies any specific issues/concerns regarding living with her sister currently, but states she is uncertain if she will be able to live with her sister after discharge.  Risk to Self:  No Risk to Others:  No Prior Inpatient Therapy:  No Prior Outpatient Therapy:  Not clarified  Past Medical History:  Past Medical History:  Diagnosis Date  . Anxiety   . ESRD (end stage renal disease) on dialysis ([place])   . Neuropathy   . Schizophrenia [facility])     Past Surgical History:  Procedure Laterality Date  . CHOLECYSTECTOMY    . COLONOSCOPY N/A 01/20/2016   Dr. Oneida Alar: 12 mm tubular adenoma removed from the transverse colon, 4 hyperplastic polyps removed from the rectum and sigmoid colon.  Next colonoscopy planned for April 2020.  Marland Kitchen ESOPHAGOGASTRODUODENOSCOPY N/A 12/19/2017   Procedure: ESOPHAGOGASTRODUODENOSCOPY (EGD);  Surgeon: [name], MD;  Location: [facility];  Service: Endoscopy;  Laterality: N/A;  8:30am  . fistula left arm    . GIVENS CAPSULE STUDY N/A 01/14/2018   Procedure: GIVENS CAPSULE STUDY;  Surgeon: [name], MD;  Location: [facility] END[facility];  Service: Endoscopy;  Laterality: N/A;  7:30am  .  HEMICOLECTOMY Right   . TUBAL LIGATION     Family History:  Family History  Problem Relation Age of Onset  . Alzheimer's disease Mother   . Cancer Mother        pancreatic  . Diabetes Mother   . Prostate cancer Father   . Kidney failure Sister   . Breast cancer Neg Hx   . Colon cancer Neg Hx     Family Psychiatric  History: None  Social History:  Social History   Substance and Sexual Activity  Alcohol Use No     Social History   Substance and Sexual Activity  Drug Use No    Social History   Socioeconomic History  . Marital status: Single    Spouse name: Not on file  . Number of children: Not on file  . Years of education: Not on file  . Highest education level: Not on file  Occupational History  . Not on file  Tobacco Use  . Smoking status: Never Smoker  . Smokeless tobacco: Never Used  Vaping Use  . Vaping Use: Never used  Substance and Sexual Activity  . Alcohol use: No  . Drug use: No  . Sexual activity: Yes  Other Topics Concern  . Not on file  Social History Narrative  . Not on file   Social Determinants of Health   Financial Resource Strain:   . Difficulty of Paying Living Expenses:   Food Insecurity:   . Worried About Charity fundraiser in the Last Year:   . Arboriculturist in the Last Year:   Transportation Needs:   . Film/video editor (Medical):   Marland Kitchen Lack of Transportation (Non-Medical):   Physical Activity:   . Days of Exercise per Week:   . Minutes of Exercise per Session:   Stress:   . Feeling of Stress :   Social Connections:   . Frequency of Communication with Friends and Family:   . Frequency of Social Gatherings with Friends and Family:   . Attends Religious Services:   . Active Member of Clubs or Organizations:   . Attends Archivist Meetings:   Marland Kitchen Marital Status:    Additional Social History:  Living with sister  Allergies:  No Known Allergies  Labs:  Results for orders placed or performed during the hospital encounter of 04/05/20 (from the past 48 hour(s))  Basic metabolic panel     Status: Abnormal   Collection Time: 04/11/20  4:32 AM  Result Value Ref Range   Sodium 129 (L) 135 - 145 mmol/L   Potassium 5.6 (H) 3.5 - 5.1 mmol/L   Chloride 94 (L) 98 - 111 mmol/L   CO2 24 22 - 32 mmol/L   Glucose,  Bld 92 70 - 99 mg/dL    Comment: Glucose reference range applies only to samples taken after fasting for at least 8 hours.   BUN 90 (H) 8 - 23 mg/dL   Creatinine, Ser 9.40 (H) 0.44 - 1.00 mg/dL   Calcium 8.5 (L) 8.9 - 10.3 mg/dL   GFR calc non Af Amer 4 (L) >60 mL/min   GFR calc Af Amer 4 (L) >60 mL/min   Anion gap 11 5 - 15    Comment: Performed at [facility], [address] [place], [place] [address]  Parathyroid hormone, intact (no Ca)     Status: Abnormal   Collection Time: 04/11/20  4:32 AM  Result Value Ref Range   PTH 449 (  H) 15 - 65 pg/mL    Comment: (NOTE) Performed At: [facility] [place], [place] [id number] Rush Farmer MD GQ:[id number]   Renal function panel     Status: Abnormal   Collection Time: 04/11/20  4:32 AM  Result Value Ref Range   Sodium 127 (L) 135 - 145 mmol/L   Potassium 5.6 (H) 3.5 - 5.1 mmol/L   Chloride 91 (L) 98 - 111 mmol/L   CO2 22 22 - 32 mmol/L   Glucose, Bld 88 70 - 99 mg/dL    Comment: Glucose reference range applies only to samples taken after fasting for at least 8 hours.   BUN 87 (H) 8 - 23 mg/dL   Creatinine, Ser 9.53 (H) 0.44 - 1.00 mg/dL   Calcium 8.5 (L) 8.9 - 10.3 mg/dL   Phosphorus 5.9 (H) 2.5 - 4.6 mg/dL   Albumin 2.4 (L) 3.5 - 5.0 g/dL   GFR calc non Af Amer 4 (L) >60 mL/min   GFR calc Af Amer 4 (L) >60 mL/min   Anion gap 14 5 - 15    Comment: Performed at [facility], [address] [place], [place] [address]  CBC     Status: Abnormal   Collection Time: 04/11/20  4:32 AM  Result Value Ref Range   WBC 6.1 4.0 - 10.5 K/uL   RBC 2.86 (L) 3.87 - 5.11 MIL/uL   Hemoglobin 9.4 (L) 12.0 - 15.0 g/dL   HCT 26.9 (L) 36 - 46 %   MCV 94.1 80.0 - 100.0 fL   MCH 32.9 26.0 - 34.0 pg   MCHC 34.9 30.0 - 36.0 g/dL   RDW 16.2 (H) 11.5 - 15.5 %   Platelets 143 (L) 150 - 400 K/uL   nRBC 0.0 0.0 - 0.2 %    Comment: Performed at [facility], [address] [place], [place] [address]   Osmolality     Status: Abnormal   Collection Time: 04/11/20  8:42 AM  Result Value Ref Range   Osmolality 301 (H) 275 - 295 mOsm/kg    Comment: Performed at [facility], [address] [place], [place] [address]  Glucose, capillary     Status: None   Collection Time: 04/11/20  8:54 PM  Result Value Ref Range   Glucose-Capillary 99 70 - 99 mg/dL    Comment: Glucose reference range applies only to samples taken after fasting for at least 8 hours.  TSH     Status: Abnormal   Collection Time: 04/12/20  4:59 AM  Result Value Ref Range   TSH 5.097 (H) 0.350 - 4.500 uIU/mL    Comment: Performed by a 3rd Generation assay with a functional sensitivity of <=0.01 uIU/mL. Performed at [facility], [address] [place], [place] [address]   Basic metabolic panel     Status: Abnormal   Collection Time: 04/12/20  4:59 AM  Result Value Ref Range   Sodium 133 (L) 135 - 145 mmol/L   Potassium 6.0 (H) 3.5 - 5.1 mmol/L    Comment: HEMOLYSIS AT THIS LEVEL MAY AFFECT RESULT   Chloride 94 (L) 98 - 111 mmol/L   CO2 24 22 - 32 mmol/L   Glucose, Bld 75 70 - 99 mg/dL    Comment: Glucose reference range applies only to samples taken after fasting for at least 8 hours.   BUN 55 (H) 8 - 23 mg/dL   Creatinine, Ser 7.23 (H) 0.44 - 1.00 mg/dL   Calcium  9.0 8.9 - 10.3 mg/dL   GFR calc non Af Amer 5 (L) >60 mL/min   GFR calc Af Amer 6 (L) >60 mL/min   Anion gap 15 5 - 15    Comment: Performed at [facility], [address] [place], [place] [address]  Magnesium     Status: None   Collection Time: 04/12/20  4:59 AM  Result Value Ref Range   Magnesium 1.7 1.7 - 2.4 mg/dL    Comment: Performed at [facility], [address] [place], [place] [address]  CBC     Status: Abnormal   Collection Time: 04/12/20  4:59 AM  Result Value Ref Range   WBC 6.5 4.0 - 10.5 K/uL   RBC 3.15 (L) 3.87 - 5.11 MIL/uL   Hemoglobin 10.4 (L) 12.0 - 15.0 g/dL   HCT 31.0 (L) 36 - 46 %    MCV 98.4 80.0 - 100.0 fL   MCH 33.0 26.0 - 34.0 pg   MCHC 33.5 30.0 - 36.0 g/dL   RDW 16.3 (H) 11.5 - 15.5 %   Platelets 133 (L) 150 - 400 K/uL   nRBC 0.0 0.0 - 0.2 %    Comment: Performed at [facility], [address] [place], [place] [address]  T4, free     Status: None   Collection Time: 04/12/20  4:59 AM  Result Value Ref Range   Free T4 0.77 0.61 - 1.12 ng/dL    Comment: (NOTE) Biotin ingestion may interfere with free T4 tests. If the results are inconsistent with the TSH level, previous test results, or the clinical presentation, then consider biotin interference. If needed, order repeat testing after stopping biotin. Performed at [facility], [address] [place], [place] [address]     Current Facility-Administered Medications  Medication Dose Route Frequency Provider Last Rate Last Admin  . acetaminophen (TYLENOL) tablet 650 mg  650 mg Oral Q4H PRN Athena Masse, MD      . ALPRAZolam Duanne Moron) tablet 0.25 mg  0.25 mg Oral TID PRN Nicole Kindred A, DO   0.25 mg at 04/11/20 0227  . aspirin EC tablet 81 mg  81 mg Oral Daily Athena Masse, MD   81 mg at 04/12/20 0954  . bisacodyl (DULCOLAX) suppository 10 mg  10 mg Rectal Daily PRN Sharion Settler, NP      . Chlorhexidine Gluconate Cloth 2 % PADS 6 each  6 each Topical Q0600 Anthonette Legato, MD   6 each at 04/12/20 [phone]  . dextromethorphan-guaiFENesin (MUCINEX DM) 30-600 MG per 12 hr tablet 1 tablet  1 tablet Oral BID Nicole Kindred A, DO   1 tablet at 04/12/20 0955  . divalproex (DEPAKOTE) DR tablet 250 mg  250 mg Oral BID Nicole Kindred A, DO   250 mg at 04/12/20 0954  . docusate sodium (COLACE) capsule 100 mg  100 mg Oral BID Sharion Settler, NP   100 mg at 04/12/20 0954  . DULoxetine (CYMBALTA) DR capsule 20 mg  20 mg Oral Daily Eulas Post, MD   20 mg at 04/12/20 0954  . epoetin alfa (EPOGEN) injection 10,000 Units  10,000 Units Intravenous Q M,W,F-HD Kolluru, Sarath, MD   10,000 Units  at 04/11/20 1148  . ferric citrate (AURYXIA) tablet 420 mg  420 mg Oral TID WC Kolluru, Sarath, MD   420 mg at 04/12/20 0954  . heparin injection 5,000 Units  5,000 Units Subcutaneous Q8H Nicole Kindred A, DO   5,000 Units at  04/12/20 7510  . metoprolol tartrate (LOPRESSOR) tablet 12.5 mg  12.5 mg Oral BID Nicole Kindred A, DO   12.5 mg at 04/12/20 0954  . nitroGLYCERIN (NITROSTAT) SL tablet 0.4 mg  0.4 mg Sublingual Q5 Min x 3 PRN Athena Masse, MD      . OLANZapine (ZYPREXA) tablet 15 mg  15 mg Oral QHS Nicole Kindred A, DO   15 mg at 04/11/20 2154  . ondansetron (ZOFRAN) injection 4 mg  4 mg Intravenous Q6H PRN Athena Masse, MD      . pantoprazole (PROTONIX) EC tablet 40 mg  40 mg Oral Daily Nicole Kindred A, DO   40 mg at 04/12/20 0954  . thiamine tablet 50 mg  50 mg Oral Daily Nicole Kindred A, DO   50 mg at 04/12/20 2585    Musculoskeletal: Strength & Muscle Tone: decreased Gait & Station: unsteady Patient leans: None  Psychiatric Specialty Exam: Physical Exam Vitals and nursing note reviewed.  Constitutional:      Comments: Frail and thin  HENT:     Head: Normocephalic and atraumatic.  Cardiovascular:     Rate and Rhythm: Normal rate.  Pulmonary:     Effort: Pulmonary effort is normal. No respiratory distress.  Musculoskeletal:        General: Normal range of motion.  Skin:    General: Skin is dry.  Neurological:     Mental Status: She is alert and oriented to person, place, and time.  Psychiatric:        Attention and Perception: Attention and perception normal.        Mood and Affect: Mood normal.        Speech: Speech normal.        Behavior: Behavior normal.        Thought Content: Thought content normal.        Cognition and Memory: Memory normal.        Judgment: Judgment normal.     Review of Systems  Constitutional: Activity change: decreased.  Respiratory: Negative.   Cardiovascular: Negative.   Gastrointestinal: Negative.   Musculoskeletal:  Negative.   Skin: Negative.   Allergic/Immunologic: Negative.   Neurological: Negative.   Hematological: Negative.   Psychiatric/Behavioral: Negative for agitation, behavioral problems, confusion (slowed), dysphoric mood, hallucinations and suicidal ideas. The patient is nervous/anxious.   All other systems reviewed and are negative.   Blood pressure (!) 120/56, pulse 72, temperature 97.9 F (36.6 C), temperature source Oral, resp. rate 16, height 5\' 1"  (1.549 m), weight 68.9 kg, SpO2 100 %.Body mass index is 28.72 kg/m.  General Appearance: Casual  Eye Contact:  Good  Speech:  Normal   Volume:  Decreased  Mood:  Anxious at times  Affect:  Congruent  Thought Process: Logical  Orientation:  Full (Time, Place, and Person)  Thought Content:  Logical and Hallucinations: None  Suicidal Thoughts:  No  Homicidal Thoughts:  No  Memory:  Immediate;   Fair Recent;   Fair Remote;   Fair  Judgement:  Fair  Insight:  Fair  Psychomotor Activity:  Decreased  Concentration:  Concentration: Good and Attention Span: Good  Recall:  [place] of Knowledge:  Good  Language:  Good  Akathisia:  No  Handed:  Right  AIMS (if indicated):     Assets:  Communication Skills Desire for Improvement Financial Resources/Insurance Resilience  ADL's:  Impaired  Cognition:  Impaired,  Mild due to age  Sleep:  Treatment Plan Summary: Medication management  Depression and anxiety: Continue Cymbalta 20 mg daily for depression and anxiety  Schizoaffective disorder: Continue Depakote 250 mg twice daily for mood stabilization.   Recommend Depakote level.  Ammonia level within normal limits is reassuring Continue Zyprexa 15 mg daily at bedtime for schizophrenia.  Recommend weaning this down to lowest dose tolerable without return of schizophrenia symptoms. Recommend discontinuation of benzodiazepines as needed anxiety medication.  If patient has become dependent on these, consider a tapered  regimen.   Disposition: No evidence of imminent risk to self or others at present.   Patient does not meet criteria for psychiatric inpatient admission. Supportive therapy provided about ongoing stressors. Appreciate social work assistance in addressing discharge placement needs.   Waylan Boga, NP 04/12/2020 11:52 AM

## 2020-04-13 DIAGNOSIS — I214 Non-ST elevation (NSTEMI) myocardial infarction: Secondary | ICD-10-CM | POA: Diagnosis not present

## 2020-04-13 DIAGNOSIS — R778 Other specified abnormalities of plasma proteins: Secondary | ICD-10-CM | POA: Diagnosis not present

## 2020-04-13 DIAGNOSIS — J189 Pneumonia, unspecified organism: Secondary | ICD-10-CM | POA: Diagnosis not present

## 2020-04-13 LAB — BASIC METABOLIC PANEL
Anion gap: 14 (ref 5–15)
BUN: 77 mg/dL — ABNORMAL HIGH (ref 8–23)
CO2: 25 mmol/L (ref 22–32)
Calcium: 8.8 mg/dL — ABNORMAL LOW (ref 8.9–10.3)
Chloride: 91 mmol/L — ABNORMAL LOW (ref 98–111)
Creatinine, Ser: 9.1 mg/dL — ABNORMAL HIGH (ref 0.44–1.00)
GFR calc Af Amer: 5 mL/min — ABNORMAL LOW (ref >60)
GFR calc non Af Amer: 4 mL/min — ABNORMAL LOW (ref >60)
Glucose, Bld: 88 mg/dL (ref 70–99)
Potassium: 5.7 mmol/L — ABNORMAL HIGH (ref 3.5–5.1)
Sodium: 130 mmol/L — ABNORMAL LOW (ref 135–145)

## 2020-04-13 LAB — CBC
HCT: 27.5 % — ABNORMAL LOW (ref 36.0–46.0)
Hemoglobin: 9.2 g/dL — ABNORMAL LOW (ref 12.0–15.0)
MCH: 33 pg (ref 26.0–34.0)
MCHC: 33.5 g/dL (ref 30.0–36.0)
MCV: 98.6 fL (ref 80.0–100.0)
Platelets: 153 10*3/uL (ref 150–400)
RBC: 2.79 MIL/uL — ABNORMAL LOW (ref 3.87–5.11)
RDW: 15.9 % — ABNORMAL HIGH (ref 11.5–15.5)
WBC: 5.4 10*3/uL (ref 4.0–10.5)
nRBC: 0 % (ref 0.0–0.2)

## 2020-04-13 MED ORDER — GABAPENTIN 100 MG PO CAPS
100.0000 mg | ORAL_CAPSULE | Freq: Two times a day (BID) | ORAL | Status: DC
  Administered 2020-04-13 – 2020-04-18 (×10): 100 mg via ORAL
  Filled 2020-04-13 (×10): qty 1

## 2020-04-13 MED ORDER — HEPARIN SODIUM (PORCINE) 1000 UNIT/ML DIALYSIS
50.0000 [IU]/kg | INTRAMUSCULAR | Status: DC | PRN
  Filled 2020-04-13: qty 4

## 2020-04-13 MED ORDER — SODIUM ZIRCONIUM CYCLOSILICATE 5 G PO PACK
5.0000 g | PACK | Freq: Every day | ORAL | Status: DC
  Administered 2020-04-13 – 2020-04-18 (×6): 5 g via ORAL
  Filled 2020-04-13 (×6): qty 1

## 2020-04-13 NOTE — TOC Progression Note (Signed)
Transition of Care [facility]) - Progression Note    Patient Details  Name: Madison [name] MRN: [id number] Date of Birth: 1951/07/30  [facility] CM/SW Contact  Shelbie Hutching, RN Phone Number: 04/13/2020, 9:25 AM  Clinical Narrative:     Referral faxed to [facility] for group home placement.  Referral also faxed to [facility] in [place].   Expected Discharge Plan: Group Home Barriers to Discharge: Continued Medical Work up  Expected Discharge Plan and Services Expected Discharge Plan: Group Home   Discharge Planning Services: CM Consult   Living arrangements for the past 2 months: Single Family Home                                       Social Determinants of Health (SDOH) Interventions    Readmission Risk Interventions No flowsheet data found.

## 2020-04-13 NOTE — Progress Notes (Signed)
[facility]  ROUNDING NOTE   Subjective:   Seen and examined on hemodialysis treatment. Tolerating treatment well. 2K bath.    HEMODIALYSIS FLOWSHEET:  Blood Flow Rate (mL/min): 400 mL/min Arterial Pressure (mmHg): -140 mmHg Venous Pressure (mmHg): 180 mmHg Transmembrane Pressure (mmHg): 60 mmHg Ultrafiltration Rate (mL/min): 720 mL/min Dialysate Flow Rate (mL/min): 600 ml/min Conductivity: Machine : 14.8 Conductivity: Machine : 14.8 Dialysis Fluid Bolus: Normal Saline Bolus Amount (mL): 250 mL    Objective:  Vital signs in last 24 hours:  Temp:  [98 F (36.7 C)-99.7 F (37.6 C)] 98.2 F (36.8 C) (07/21 0940) Pulse Rate:  [54-79] 59 (07/21 1230) Resp:  [12-20] 15 (07/21 1230) BP: (99-155)/(46-88) 141/61 (07/21 1230) SpO2:  [97 %-100 %] 99 % (07/21 0940)  Weight change:  Filed Weights   04/05/20 1901 04/06/20 1752 04/07/20 0632  Weight: 71.7 kg 56.1 kg 68.9 kg    Intake/Output: I/O last 3 completed shifts: In: 720 [P.O.:720] Out: -    Intake/Output this shift:  No intake/output data recorded.  Physical Exam: General: NAD,   Head: Normocephalic, atraumatic. Moist oral mucosal membranes  Eyes: Anicteric, PERRL  Neck: Supple, trachea midline  Lungs:  Clear to auscultation  Heart: Regular rate and rhythm  Abdomen:  Soft, nontender,   Extremities:  No peripheral edema.  Neurologic: Alert to self and place  Skin: No lesions  Access:  left  Upper extremity AVF     Basic Metabolic Panel: Recent Labs  Lab 04/06/20 1555 04/07/20 0131 04/07/20 0131 04/08/20 6734 04/08/20 1937 04/09/20 0543 04/09/20 0543 04/10/20 0352 04/10/20 0352 04/11/20 0432 04/12/20 0459 04/12/20 1448 04/13/20 0528  NA  --  139   < > 137   < > 135  --  132*  --  127*  129* 133*  --  130*  K  --  4.3   < > 4.2   < > 4.9   < > 4.8  --  5.6*  5.6* 6.0* 6.0* 5.7*  CL  --  97*   < > 99   < > 95*  --  94*  --  91*  94* 94*  --  91*  CO2  --  29   < > 26   < > 28  --   24  --  22  24 24   --  25  GLUCOSE  --  80   < > 88   < > 95  --  92  --  88  92 75  --  88  BUN  --  26*   < > 58*   < > 47*  --  65*  --  87*  90* 55*  --  77*  CREATININE  --  4.96*   < > 7.50*   < > 6.17*  --  8.04*  --  9.53*  9.40* 7.23*  --  9.10*  CALCIUM  --  9.5   < > 8.2*   < > 8.9   < > 8.6*   < > 8.5*  8.5* 9.0  --  8.8*  MG  --  1.8  --  1.8  --  1.7  --   --   --   --  1.7  --   --   PHOS 2.6  --   --   --   --   --   --   --   --  5.9*  --   --   --    < > =  values in this interval not displayed.    Liver Function Tests: Recent Labs  Lab 04/10/20 0352 04/11/20 0432  AST 66*  --   ALT 29  --   ALKPHOS 53  --   BILITOT 1.1  --   PROT 6.2*  --   ALBUMIN 2.7* 2.4*   No results for input(s): LIPASE, AMYLASE in the last 168 hours. Recent Labs  Lab 04/08/20 1330  AMMONIA 26    CBC: Recent Labs  Lab 04/07/20 0131 04/07/20 0131 04/08/20 0433 04/09/20 0543 04/11/20 0432 04/12/20 0459 04/13/20 0528  WBC 6.7   < > 5.3 5.4 6.1 6.5 5.4  NEUTROABS 4.3  --  3.3 3.3  --   --   --   HGB 11.8*   < > 9.8* 10.3* 9.4* 10.4* 9.2*  HCT 35.1*   < > 28.8* 31.7* 26.9* 31.0* 27.5*  MCV 95.9   < > 96.0 99.7 94.1 98.4 98.6  PLT 190   < > 154 146* 143* 133* 153   < > = values in this interval not displayed.    Cardiac Enzymes: Recent Labs  Lab 04/07/20 0753 04/08/20 0433  CKTOTAL 2,226* 1,169*    BNP: Invalid input(s): POCBNP  CBG: Recent Labs  Lab 04/11/20 2054  GLUCAP 70    Microbiology: Results for orders placed or performed during the hospital encounter of 04/05/20  Culture, blood (routine x 2)     Status: None   Collection Time: 04/05/20 11:08 PM   Specimen: BLOOD RIGHT FOREARM  Result Value Ref Range Status   Specimen Description BLOOD RIGHT FOREARM  Final   Special Requests   Final    BOTTLES DRAWN AEROBIC ONLY Blood Culture adequate volume   Culture   Final    NO GROWTH 5 DAYS Performed at [facility], [address]  [place], [place] [address]    Report Status 04/11/2020 FINAL  Final  MRSA PCR Screening     Status: Abnormal   Collection Time: 04/05/20 11:15 PM   Specimen: Nasopharyngeal  Result Value Ref Range Status   MRSA by PCR POSITIVE (A) NEGATIVE Final    Comment:        The GeneXpert MRSA Assay (FDA approved for NASAL specimens only), is one component of a comprehensive MRSA colonization surveillance program. It is not intended to diagnose MRSA infection nor to guide or monitor treatment for MRSA infections. RESULT CALLED TO, READ BACK BY AND VERIFIED WITH: LILLY WALKER@1320  ON 04/07/20 BY HKP Performed at [facility], [address] [place], [place] [address]   SARS Coronavirus 2 by RT PCR (hospital order, performed in [facility] hospital lab) Nasopharyngeal Nasopharyngeal Swab     Status: None   Collection Time: 04/05/20 11:31 PM   Specimen: Nasopharyngeal Swab  Result Value Ref Range Status   SARS Coronavirus 2 NEGATIVE NEGATIVE Final    Comment: (NOTE) SARS-CoV-2 target nucleic acids are NOT DETECTED.  The SARS-CoV-2 RNA is generally detectable in upper and lower respiratory specimens during the acute phase of infection. The lowest concentration of SARS-CoV-2 viral copies this assay can detect is 250 copies / mL. A negative result does not preclude SARS-CoV-2 infection and should not be used as the sole basis for treatment or other patient management decisions.  A negative result may occur with improper specimen collection / handling, submission of specimen other than nasopharyngeal swab, presence of viral mutation(s) within the areas targeted by this assay, and inadequate number of viral copies (<250  copies / mL). A negative result must be combined with clinical observations, patient history, and epidemiological information.  Fact Sheet for Patients:   StrictlyIdeas.no  Fact Sheet for Healthcare  Providers: BankingDealers.co.za  This test is not yet approved or  cleared by the [place] FDA and has been authorized for detection and/or diagnosis of SARS-CoV-2 by FDA under an Emergency Use Authorization (EUA).  This EUA will remain in effect (meaning this test can be used) for the duration of the COVID-19 declaration under Section 564(b)(1) of the Act, 21 U.S.C. section 360bbb-3(b)(1), unless the authorization is terminated or revoked sooner.  Performed at [facility], [address] [place], [place] [address]   Culture, blood (routine x 2)     Status: None   Collection Time: 04/06/20 12:27 AM   Specimen: BLOOD RIGHT HAND  Result Value Ref Range Status   Specimen Description BLOOD RIGHT HAND  Final   Special Requests   Final    BOTTLES DRAWN AEROBIC ONLY Blood Culture adequate volume   Culture   Final    NO GROWTH 5 DAYS Performed at [facility], [address] [place], [place] [address]    Report Status 04/11/2020 FINAL  Final    Coagulation Studies: No results for input(s): LABPROT, INR in the last 72 hours.  Urinalysis: No results for input(s): COLORURINE, LABSPEC, PHURINE, GLUCOSEU, HGBUR, BILIRUBINUR, KETONESUR, PROTEINUR, UROBILINOGEN, NITRITE, LEUKOCYTESUR in the last 72 hours.  Invalid input(s): APPERANCEUR    Imaging: No results found.   Medications:    . aspirin EC  81 mg Oral Daily  . Chlorhexidine Gluconate Cloth  6 each Topical Q0600  . dextromethorphan-guaiFENesin  1 tablet Oral BID  . divalproex  250 mg Oral BID  . docusate sodium  100 mg Oral BID  . DULoxetine  20 mg Oral Daily  . epoetin (EPOGEN/PROCRIT) injection  10,000 Units Intravenous Q M,W,F-HD  . ferric citrate  420 mg Oral TID WC  . heparin injection (subcutaneous)  5,000 Units Subcutaneous Q8H  . metoprolol tartrate  12.5 mg Oral BID  . mupirocin ointment   Nasal BID  . OLANZapine  15 mg Oral QHS  . pantoprazole  40 mg Oral Daily   . thiamine  50 mg Oral Daily   acetaminophen, ALPRAZolam, bisacodyl, heparin, nitroGLYCERIN, ondansetron (ZOFRAN) IV  Assessment/ Plan:  Madison [name] is a 69 y.o. black female  with end stage renal disease on hemodialysis, schizophrenia, hypertension, who was admitted to [facility] on 04/05/2020 for NSTEMI (non-ST elevated myocardial infarction) ([place]) [I21.4] Non-traumatic rhabdomyolysis [M62.82] Altered mental status, unspecified altered mental status type [X54.00] Acute metabolic encephalopathy [Q67.61] Sepsis, due to unspecified organism, unspecified whether acute organ dysfunction present ([place]) [A41.9]   [facility] (Dr. Leontine Locket) 55.5kg Left AVF  1. ESRD on HD MWF with hyperkalemia: Seen and examined on hemodialysis treatment. 2K bath - low potassium diet - lokelma - she has been on this agent in the past.    2. Anemia of Chronic kidney disease:   - EPO with HD treatment.   3. Secondary hyperparathyroidism with hyperphosphatemia.  Madison [name].   4. Hypertension: previously on amlodipine as well. Well controlled.  - continue metoprolol.    LOS: 7 Madison [name] 7/21/202112:47 PM

## 2020-04-13 NOTE — Progress Notes (Signed)
PROGRESS NOTE    Madison [name]  GYJ:[id number] DOB: [birth year] DOA: 04/05/2020 PCP: Vidal Schwalbe, MD   Brief Narrative:  Madison [name] a 69 y.o.femalewith medical history significant forschizoaffective disorder bipolar type, ESRD on HD MWF last dialysis 04/04/2020, with no missed dialysis sessions who was brought in after she was found lying on the ground at the neighbors house for an unknown period of time and with empty pill packs found by patient's sister but unknown if patient overdosed.  Patient later admitted to taking all the pills she could find because she was "tired of it all". Sister also reported recent increased confusion with the patient, but no recent illnesses, fevers/chills or complaints of other symptoms.  In the ED, patient awake but confused.  Normal vitals.  EKG normal sinus rhythm and no acute ST-T wave changes. Pertinent labs include WBC 10.7k, CK 1711, troponin 158>>173. Lactic acid 2.8 >> 2.9.  Normal depakote level.  CXR showed mid and lower lung zones opacities concerning for pneumonia including atypical viral infection vs atelectasis. Covid-19 PCR negative. She was fluid resuscitated and treated with broad spectrum antibiotics in the ED per sepsis protocol.  Admitted to hospitalist service.    Subjective: Patient was seen during dialysis today. She refused to talk, only said that she wants to go home. She refused to answer any other questions.  Assessment & Plan:   Principal Problem:   Acute metabolic encephalopathy Active Problems:   ESRD on hemodialysis (HCC)   Schizoaffective disorder, bipolar type (HCC)   Rhabdomyolysis   Elevated troponin   Pneumonia   Lactic acidosis   Suspected Sepsis ([place])   Possible NSTEMI (non-ST elevated myocardial infarction) ([place])    Suspected Overdose, undetermined intent, initial encounter  Acute metabolic encephalopathy / Decompensated Schizoaffective and Bipolar Disorders. POA, patient refused to answer any of  the orientation questions today. Patient was originally found with empty pill pockets. There was some concern of intentional overdose, psychiatry was consulted and later find out that she was having some attention seeking behavior due to difficulty with her sister. Patient has normal CT head, normal ammonia, B12 high, thiamine levels pending an MRI without any acute abnormalities. Difficult family situation as sister does not want her to come back to her house. Psychiatry does not think that she qualify for inpatient behavioral health. -TOC is looking for a group home.  Schizoaffective disorder, bipolar type - Depakote level within normal limits on admission.  Psychiatry is consulted given suicide attempt, was considered attention seeking. -Continue Zyprexa and Depakote. -Cymbalta was added by psychiatry. -We will see is looking for a group home as family cannot provide care.  Sepsis due to Community-acquired PNA / Lactic acidosis. Resolved. Initially meeting sepsis criteria with elevated lactic acid, mild leukocytosis. She was treated with broad-spectrum antibiotics, completed the course. Cultures remain negative. She was initially treated with IV Rocephin and Zithromax followed by Augmentin.  Anemia of chronic disease. Iron panel consistent with anemia of chronic disease most likely secondary to renal. -Continue to monitor -Transfuse if below 7.  Nontraumatic rhabdomyolysis. CK elevated on presentation. Unable to give much IV fluid as patient is on HD. It was trending down when last checked on 04/08/2020  ESRD on hemodialysis M/W/F - had not missed any sessions except day of admission. -Patient continued to get her scheduled dialysis.  GERD. Continue with Protonix.  Neuropathy. Gabapentin dose was held due to altered mental status initially. -Restart home dose of gabapentin.  Elevated troponin. No chest pain,  and trending down. Cardiology was consulted and they think that insignificant  T wave changes were due to hypertensive urgency. Initially treated with heparin infusion which was discontinued but cardiology. -Continue aspirin, statin and beta-blocker.  Objective: Vitals:   04/13/20 1300 04/13/20 1315 04/13/20 1320 04/13/20 1538  BP: (!) 146/61 (!) 147/68 (!) 147/68 (!) 113/54  Pulse: 65 72 72 73  Resp: 19 20 20 16   Temp:  97.6 F (36.4 C) 97.6 F (36.4 C) 98.2 F (36.8 C)  TempSrc:  Oral Oral Oral  SpO2:   100% 99%  Weight:      Height:        Intake/Output Summary (Last 24 hours) at 04/13/2020 1645 Last data filed at 04/13/2020 1320 Gross per 24 hour  Intake 240 ml  Output 2000 ml  Net -1760 ml   Filed Weights   04/05/20 1901 04/06/20 1752 04/07/20 5176  Weight: 71.7 kg 56.1 kg 68.9 kg    Examination:  General exam: Appears calm and comfortable  Respiratory system: Clear to auscultation. Respiratory effort normal. Cardiovascular system: S1 & S2 heard, RRR. No JVD, murmurs, rubs, gallops or clicks. Gastrointestinal system: Soft, nontender, nondistended, bowel sounds positive. Central nervous system: Alert, refused to answer any orientation questions. No focal neurological deficits. Extremities: No edema, no cyanosis, pulses intact and symmetrical. Skin: No rashes, lesions or ulcers Psychiatry: Patient appears depressed and withdrawn.  DVT prophylaxis: Heparin Code Status: Full Family Communication: No family at bedside Disposition Plan:  Status is: Inpatient  Remains inpatient appropriate because:Unsafe d/c plan   Dispo:  Patient From: Home  Planned Disposition: Group home  Expected discharge date: 04/11/20  Medically stable for discharge: yes   Consultants:   Nephrology  Psychiatry  Cardiology  Procedures:  Antimicrobials:   Data Reviewed: I have personally reviewed following labs and imaging studies  CBC: Recent Labs  Lab 04/07/20 0131 04/07/20 0131 04/08/20 0433 04/09/20 0543 04/11/20 0432 04/12/20 0459  04/13/20 0528  WBC 6.7   < > 5.3 5.4 6.1 6.5 5.4  NEUTROABS 4.3  --  3.3 3.3  --   --   --   HGB 11.8*   < > 9.8* 10.3* 9.4* 10.4* 9.2*  HCT 35.1*   < > 28.8* 31.7* 26.9* 31.0* 27.5*  MCV 95.9   < > 96.0 99.7 94.1 98.4 98.6  PLT 190   < > 154 146* 143* 133* 153   < > = values in this interval not displayed.   Basic Metabolic Panel: Recent Labs  Lab 04/07/20 0131 04/07/20 0131 04/08/20 1607 04/08/20 3710 04/09/20 0543 04/09/20 0543 04/10/20 0352 04/11/20 0432 04/12/20 0459 04/12/20 1448 04/13/20 0528  NA 139   < > 137   < > 135  --  132* 127*  129* 133*  --  130*  K 4.3   < > 4.2   < > 4.9   < > 4.8 5.6*  5.6* 6.0* 6.0* 5.7*  CL 97*   < > 99   < > 95*  --  94* 91*  94* 94*  --  91*  CO2 29   < > 26   < > 28  --  24 22  24 24   --  25  GLUCOSE 80   < > 88   < > 95  --  92 88  92 75  --  88  BUN 26*   < > 58*   < > 47*  --  65* 87*  90* 55*  --  77*  CREATININE 4.96*   < > 7.50*   < > 6.17*  --  8.04* 9.53*  9.40* 7.23*  --  9.10*  CALCIUM 9.5   < > 8.2*   < > 8.9  --  8.6* 8.5*  8.5* 9.0  --  8.8*  MG 1.8  --  1.8  --  1.7  --   --   --  1.7  --   --   PHOS  --   --   --   --   --   --   --  5.9*  --   --   --    < > = values in this interval not displayed.   GFR: Estimated Creatinine Clearance: 5.1 mL/min (A) (by C-G formula based on SCr of 9.1 mg/dL (H)). Liver Function Tests: Recent Labs  Lab 04/10/20 0352 04/11/20 0432  AST 66*  --   ALT 29  --   ALKPHOS 53  --   BILITOT 1.1  --   PROT 6.2*  --   ALBUMIN 2.7* 2.4*   No results for input(s): LIPASE, AMYLASE in the last 168 hours. Recent Labs  Lab 04/08/20 1330  AMMONIA 26   Coagulation Profile: No results for input(s): INR, PROTIME in the last 168 hours. Cardiac Enzymes: Recent Labs  Lab 04/07/20 0753 04/08/20 0433  CKTOTAL 2,226* 1,169*   BNP (last 3 results) No results for input(s): PROBNP in the last 8760 hours. HbA1C: No results for input(s): HGBA1C in the last 72 hours. CBG: Recent  Labs  Lab 04/11/20 2054  GLUCAP 99   Lipid Profile: No results for input(s): CHOL, HDL, LDLCALC, TRIG, CHOLHDL, LDLDIRECT in the last 72 hours. Thyroid Function Tests: Recent Labs    04/12/20 0459  TSH 5.097*  FREET4 0.77   Anemia Panel: No results for input(s): VITAMINB12, FOLATE, FERRITIN, TIBC, IRON, RETICCTPCT in the last 72 hours. Sepsis Labs: Recent Labs  Lab 04/07/20 0753  LATICACIDVEN 1.4    Recent Results (from the past 240 hour(s))  Culture, blood (routine x 2)     Status: None   Collection Time: 04/05/20 11:08 PM   Specimen: BLOOD RIGHT FOREARM  Result Value Ref Range Status   Specimen Description BLOOD RIGHT FOREARM  Final   Special Requests   Final    BOTTLES DRAWN AEROBIC ONLY Blood Culture adequate volume   Culture   Final    NO GROWTH 5 DAYS Performed at [facility], [address] [place], [place] [address]    Report Status 04/11/2020 FINAL  Final  MRSA PCR Screening     Status: Abnormal   Collection Time: 04/05/20 11:15 PM   Specimen: Nasopharyngeal  Result Value Ref Range Status   MRSA by PCR POSITIVE (A) NEGATIVE Final    Comment:        The GeneXpert MRSA Assay (FDA approved for NASAL specimens only), is one component of a comprehensive MRSA colonization surveillance program. It is not intended to diagnose MRSA infection nor to guide or monitor treatment for MRSA infections. RESULT CALLED TO, READ BACK BY AND VERIFIED WITH: LILLY WALKER@1320  ON 04/07/20 BY HKP Performed at [facility], [place], [place] [address]   SARS Coronavirus 2 by RT PCR (hospital order, performed in [facility] hospital lab) Nasopharyngeal Nasopharyngeal Swab     Status: None   Collection Time: 04/05/20 11:31 PM   Specimen: Nasopharyngeal Swab  Result Value Ref Range Status   SARS Coronavirus  2 NEGATIVE NEGATIVE Final    Comment: (NOTE) SARS-CoV-2 target nucleic acids are NOT DETECTED.  The SARS-CoV-2 RNA is generally  detectable in upper and lower respiratory specimens during the acute phase of infection. The lowest concentration of SARS-CoV-2 viral copies this assay can detect is 250 copies / mL. A negative result does not preclude SARS-CoV-2 infection and should not be used as the sole basis for treatment or other patient management decisions.  A negative result may occur with improper specimen collection / handling, submission of specimen other than nasopharyngeal swab, presence of viral mutation(s) within the areas targeted by this assay, and inadequate number of viral copies (<250 copies / mL). A negative result must be combined with clinical observations, patient history, and epidemiological information.  Fact Sheet for Patients:   StrictlyIdeas.no  Fact Sheet for Healthcare Providers: BankingDealers.co.za  This test is not yet approved or  cleared by the [place] FDA and has been authorized for detection and/or diagnosis of SARS-CoV-2 by FDA under an Emergency Use Authorization (EUA).  This EUA will remain in effect (meaning this test can be used) for the duration of the COVID-19 declaration under Section 564(b)(1) of the Act, 21 U.S.C. section 360bbb-3(b)(1), unless the authorization is terminated or revoked sooner.  Performed at [facility], [address] [place], [place] [address]   Culture, blood (routine x 2)     Status: None   Collection Time: 04/06/20 12:27 AM   Specimen: BLOOD RIGHT HAND  Result Value Ref Range Status   Specimen Description BLOOD RIGHT HAND  Final   Special Requests   Final    BOTTLES DRAWN AEROBIC ONLY Blood Culture adequate volume   Culture   Final    NO GROWTH 5 DAYS Performed at [facility], [address], [place], [place] [address]    Report Status 04/11/2020 FINAL  Final     Radiology Studies: No results found.  Scheduled Meds: . aspirin EC  81 mg Oral Daily  .  Chlorhexidine Gluconate Cloth  6 each Topical Q0600  . dextromethorphan-guaiFENesin  1 tablet Oral BID  . divalproex  250 mg Oral BID  . docusate sodium  100 mg Oral BID  . DULoxetine  20 mg Oral Daily  . epoetin (EPOGEN/PROCRIT) injection  10,000 Units Intravenous Q M,W,F-HD  . ferric citrate  420 mg Oral TID WC  . heparin injection (subcutaneous)  5,000 Units Subcutaneous Q8H  . metoprolol tartrate  12.5 mg Oral BID  . mupirocin ointment   Nasal BID  . OLANZapine  15 mg Oral QHS  . pantoprazole  40 mg Oral Daily  . sodium zirconium cyclosilicate  5 g Oral Daily  . thiamine  50 mg Oral Daily   Continuous Infusions:   LOS: 7 days   Time spent: 40 minutes. I personally reviewed her chart.  Lorella Nimrod, MD [facility]  If 7PM-7AM, please contact night-coverage [email or web address]  04/13/2020, 4:45 PM   This record has been created using Systems analyst. Errors have been sought and corrected,but may not always be located. Such creation errors do not reflect on the standard of care.

## 2020-04-13 NOTE — Progress Notes (Signed)
OT Cancellation Note  Patient Details Name: Madison [name] MRN: [id number] DOB: 1950/11/26   Cancelled Treatment:    Reason Eval/Treat Not Completed: Medical issues which prohibited therapy. Chart reviewed. Pt remains with critically high potassium (5.7), contraindicated for participation in therapy. Will re-attempt at later date/time as pt is medically appropriate.   Jeni Salles, MPH, MS, OTR/L ascom [phone] 04/13/20, 2:45 PM

## 2020-04-13 NOTE — NC FL2 (Signed)
[place] LEVEL OF CARE SCREENING TOOL     IDENTIFICATION  Patient Name: Madison [name] Birthdate: 04-18-1951 Sex: female Admission Date (Current Location): 04/05/2020  [facility] and [place] Number:  Engineering geologist and Address:  [facility], [address] [place], [place] [address]      Provider Number: [id number]  Attending Physician Name and Address:  Lorella Nimrod, MD  Relative Name and Phone Number:  Ovetta Bazzano (sister)  [phone] or [phone]    Current Level of Care: Hospital Recommended Level of Care: [place] (Group home) Prior Approval Number:    Date Approved/Denied:   PASRR Number:    Discharge Plan: Other (Comment) ([place])    Current Diagnoses: Patient Active Problem List   Diagnosis Date Noted  . Acute metabolic encephalopathy 35/57/3220  . Rhabdomyolysis 04/06/2020  . Elevated troponin 04/06/2020  . Pneumonia 04/06/2020  . Lactic acidosis 04/06/2020  . Suspected Sepsis ([place]) 04/06/2020  . Possible NSTEMI (non-ST elevated myocardial infarction) ([place]) 04/06/2020  .  Suspected Overdose, undetermined intent, initial encounter 04/06/2020  . Schizoaffective disorder, bipolar type ([place])   . Bipolar affective disorder, current episode manic with psychotic symptoms ([place]) 12/25/2018  . ESRD on hemodialysis ([place]) 12/25/2018  . History of adenomatous polyp of colon 11/20/2018  . Loss of weight 10/17/2017  . Hypernatremia 04/17/2016  . CKD (chronic kidney disease) stage 3, GFR 30-59 ml/min 04/16/2016  . Normocytic anemia 04/16/2016  . Meningioma ([place]) 04/16/2016  . Acute psychosis ([place]) 04/15/2016  . Hyponatremia 04/15/2016  . AKI (acute kidney injury) ([place]) 04/15/2016  . Special screening for malignant neoplasms, colon   . Thrombocytopenia ([place]) 01/05/2016  . Altered mental status 04/19/2015    Orientation RESPIRATION BLADDER Height & Weight     Self, Time, Situation, Place  Normal  Continent Weight: 68.9 kg Height:  5\' 1"  (154.9 cm)  BEHAVIORAL SYMPTOMS/MOOD NEUROLOGICAL BOWEL NUTRITION STATUS      Continent Diet (Renal diet 1247ml fluid restriction)  AMBULATORY STATUS COMMUNICATION OF NEEDS Skin   Supervision Verbally Normal                       Personal Care Assistance Level of Assistance  Bathing, Feeding, Dressing Bathing Assistance: Independent Feeding assistance: Independent Dressing Assistance: Independent     Functional Limitations Info             SPECIAL CARE FACTORS FREQUENCY                       Contractures Contractures Info: Not present    Additional Factors Info  Code Status, Allergies, Psychotropic Code Status Info: Full Allergies Info: NKA Psychotropic Info: Schizo affective disorder bipolar type         Current Medications (04/13/2020):  This is the current hospital active medication list Current Facility-Administered Medications  Medication Dose Route Frequency Provider Last Rate Last Admin  . acetaminophen (TYLENOL) tablet 650 mg  650 mg Oral Q4H PRN Athena Masse, MD      . ALPRAZolam Duanne Moron) tablet 0.25 mg  0.25 mg Oral TID PRN Nicole Kindred A, DO   0.25 mg at 04/11/20 0227  . aspirin EC tablet 81 mg  81 mg Oral Daily Athena Masse, MD   81 mg at 04/12/20 0954  . bisacodyl (DULCOLAX) suppository 10 mg  10 mg Rectal Daily PRN Sharion Settler, NP      . Chlorhexidine Gluconate Cloth 2 %  PADS 6 each  6 each Topical Q0600 Anthonette Legato, MD   6 each at 04/13/20 0621  . dextromethorphan-guaiFENesin (MUCINEX DM) 30-600 MG per 12 hr tablet 1 tablet  1 tablet Oral BID Nicole Kindred A, DO   1 tablet at 04/12/20 2039  . divalproex (DEPAKOTE) DR tablet 250 mg  250 mg Oral BID Nicole Kindred A, DO   250 mg at 04/12/20 2039  . docusate sodium (COLACE) capsule 100 mg  100 mg Oral BID Sharion Settler, NP   100 mg at 04/12/20 2040  . DULoxetine (CYMBALTA) DR capsule 20 mg  20 mg Oral Daily Eulas Post, MD   20  mg at 04/12/20 0954  . epoetin alfa (EPOGEN) injection 10,000 Units  10,000 Units Intravenous Q M,W,F-HD Kolluru, Sarath, MD   10,000 Units at 04/11/20 1148  . ferric citrate (AURYXIA) tablet 420 mg  420 mg Oral TID WC Kolluru, Sarath, MD   420 mg at 04/12/20 1638  . heparin injection 3,400 Units  50 Units/kg Dialysis PRN Kolluru, Sarath, MD      . heparin injection 5,000 Units  5,000 Units Subcutaneous Q8H Nicole Kindred A, DO   5,000 Units at 04/13/20 0523  . metoprolol tartrate (LOPRESSOR) tablet 12.5 mg  12.5 mg Oral BID Nicole Kindred A, DO   12.5 mg at 04/12/20 2039  . mupirocin ointment (BACTROBAN) 2 %   Nasal BID Ezekiel Slocumb, DO   Given at 04/12/20 2040  . nitroGLYCERIN (NITROSTAT) SL tablet 0.4 mg  0.4 mg Sublingual Q5 Min x 3 PRN Athena Masse, MD      . OLANZapine (ZYPREXA) tablet 15 mg  15 mg Oral QHS Nicole Kindred A, DO   15 mg at 04/12/20 2039  . ondansetron (ZOFRAN) injection 4 mg  4 mg Intravenous Q6H PRN Athena Masse, MD      . pantoprazole (PROTONIX) EC tablet 40 mg  40 mg Oral Daily Nicole Kindred A, DO   40 mg at 04/12/20 0954  . thiamine tablet 50 mg  50 mg Oral Daily Nicole Kindred A, DO   50 mg at 04/12/20 2248     Discharge Medications: Please see discharge summary for a list of discharge medications.  Relevant Imaging Results:  Relevant Lab Results:   Additional Information ss# [id number]  Patient has [facility] MWF at [facility]  Shelbie Hutching, RN

## 2020-04-14 DIAGNOSIS — I214 Non-ST elevation (NSTEMI) myocardial infarction: Secondary | ICD-10-CM | POA: Diagnosis not present

## 2020-04-14 DIAGNOSIS — R778 Other specified abnormalities of plasma proteins: Secondary | ICD-10-CM | POA: Diagnosis not present

## 2020-04-14 DIAGNOSIS — J189 Pneumonia, unspecified organism: Secondary | ICD-10-CM | POA: Diagnosis not present

## 2020-04-14 LAB — QUANTIFERON-TB GOLD PLUS (RQFGPL)
QuantiFERON Mitogen Value: 4.37 IU/mL
QuantiFERON Nil Value: 0 IU/mL
QuantiFERON TB1 Ag Value: 0 IU/mL
QuantiFERON TB2 Ag Value: 0 IU/mL

## 2020-04-14 LAB — QUANTIFERON-TB GOLD PLUS: QuantiFERON-TB Gold Plus: NEGATIVE

## 2020-04-14 NOTE — Progress Notes (Signed)
OT Cancellation Note  Patient Details Name: Madison [name] MRN: [id number] DOB: [birth year]   Cancelled Treatment:    Reason Eval/Treat Not Completed: Medical issues which prohibited therapy. Chart reviewed. Pt remains with critically high potassium (5.7), contraindicated for participation in therapy. Will continue to follow remotely and re-attempt at later date/time as pt is medically appropriate.   Shara Blazing, M.S., OTR/L Ascom: [phone] 04/14/20, 8:23 AM

## 2020-04-14 NOTE — Progress Notes (Signed)
[facility]  ROUNDING NOTE   Subjective:   Hemodialysis treatment yesterday. UF of 2 liters  Objective:  Vital signs in last 24 hours:  Temp:  [98.2 F (36.8 C)-98.7 F (37.1 C)] 98.5 F (36.9 C) (07/22 1252) Pulse Rate:  [65-74] 65 (07/22 1252) Resp:  [16-20] 18 (07/22 1252) BP: (100-125)/(47-68) 102/61 (07/22 1252) SpO2:  [99 %-100 %] 100 % (07/22 1252)  Weight change:  Filed Weights   04/05/20 1901 04/06/20 1752 04/07/20 5974  Weight: 71.7 kg 56.1 kg 68.9 kg    Intake/Output: I/O last 3 completed shifts: In: 120 [P.O.:120] Out: 2000 [Other:2000]   Intake/Output this shift:  Total I/O In: 480 [P.O.:480] Out: -   Physical Exam: General: NAD,   Head: Normocephalic, atraumatic. Moist oral mucosal membranes  Eyes: Anicteric, PERRL  Neck: Supple, trachea midline  Lungs:  Clear to auscultation  Heart: Regular rate and rhythm  Abdomen:  Soft, nontender,   Extremities:  No peripheral edema.  Neurologic: Alert to self and place, pleasant  Skin: No lesions  Access:  left  Upper extremity AVF     Basic Metabolic Panel: Recent Labs  Lab 04/08/20 0433 04/08/20 0433 04/09/20 0543 04/09/20 0543 04/10/20 0352 04/10/20 0352 04/11/20 0432 04/12/20 0459 04/12/20 1448 04/13/20 0528  NA 137   < > 135  --  132*  --  127*  129* 133*  --  130*  K 4.2   < > 4.9   < > 4.8  --  5.6*  5.6* 6.0* 6.0* 5.7*  CL 99   < > 95*  --  94*  --  91*  94* 94*  --  91*  CO2 26   < > 28  --  24  --  22  24 24   --  25  GLUCOSE 88   < > 95  --  92  --  88  92 75  --  88  BUN 58*   < > 47*  --  65*  --  87*  90* 55*  --  77*  CREATININE 7.50*   < > 6.17*  --  8.04*  --  9.53*  9.40* 7.23*  --  9.10*  CALCIUM 8.2*   < > 8.9   < > 8.6*   < > 8.5*  8.5* 9.0  --  8.8*  MG 1.8  --  1.7  --   --   --   --  1.7  --   --   PHOS  --   --   --   --   --   --  5.9*  --   --   --    < > = values in this interval not displayed.    Liver Function Tests: Recent Labs  Lab  04/10/20 0352 04/11/20 0432  AST 66*  --   ALT 29  --   ALKPHOS 53  --   BILITOT 1.1  --   PROT 6.2*  --   ALBUMIN 2.7* 2.4*   No results for input(s): LIPASE, AMYLASE in the last 168 hours. Recent Labs  Lab 04/08/20 1330  AMMONIA 26    CBC: Recent Labs  Lab 04/08/20 0433 04/09/20 0543 04/11/20 0432 04/12/20 0459 04/13/20 0528  WBC 5.3 5.4 6.1 6.5 5.4  NEUTROABS 3.3 3.3  --   --   --   HGB 9.8* 10.3* 9.4* 10.4* 9.2*  HCT 28.8* 31.7* 26.9* 31.0* 27.5*  MCV 96.0 99.7 94.1 98.4  98.6  PLT 154 146* 143* 133* 153    Cardiac Enzymes: Recent Labs  Lab 04/08/20 0433  CKTOTAL 1,169*    BNP: Invalid input(s): POCBNP  CBG: Recent Labs  Lab 04/11/20 2054  GLUCAP 66    Microbiology: Results for orders placed or performed during the hospital encounter of 04/05/20  Culture, blood (routine x 2)     Status: None   Collection Time: 04/05/20 11:08 PM   Specimen: BLOOD RIGHT FOREARM  Result Value Ref Range Status   Specimen Description BLOOD RIGHT FOREARM  Final   Special Requests   Final    BOTTLES DRAWN AEROBIC ONLY Blood Culture adequate volume   Culture   Final    NO GROWTH 5 DAYS Performed at [facility], [address] [place], [place] [address]    Report Status 04/11/2020 FINAL  Final  MRSA PCR Screening     Status: Abnormal   Collection Time: 04/05/20 11:15 PM   Specimen: Nasopharyngeal  Result Value Ref Range Status   MRSA by PCR POSITIVE (A) NEGATIVE Final    Comment:        The GeneXpert MRSA Assay (FDA approved for NASAL specimens only), is one component of a comprehensive MRSA colonization surveillance program. It is not intended to diagnose MRSA infection nor to guide or monitor treatment for MRSA infections. RESULT CALLED TO, READ BACK BY AND VERIFIED WITH: LILLY WALKER@[email or web address]  ON 04/07/20 BY HKP Performed at [facility], [address] [place], [place] [address]   SARS Coronavirus 2 by RT PCR (hospital order, performed  in [facility] hospital lab) Nasopharyngeal Nasopharyngeal Swab     Status: None   Collection Time: 04/05/20 11:31 PM   Specimen: Nasopharyngeal Swab  Result Value Ref Range Status   SARS Coronavirus 2 NEGATIVE NEGATIVE Final    Comment: (NOTE) SARS-CoV-2 target nucleic acids are NOT DETECTED.  The SARS-CoV-2 RNA is generally detectable in upper and lower respiratory specimens during the acute phase of infection. The lowest concentration of SARS-CoV-2 viral copies this assay can detect is 250 copies / mL. A negative result does not preclude SARS-CoV-2 infection and should not be used as the sole basis for treatment or other patient management decisions.  A negative result may occur with improper specimen collection / handling, submission of specimen other than nasopharyngeal swab, presence of viral mutation(s) within the areas targeted by this assay, and inadequate number of viral copies (<250 copies / mL). A negative result must be combined with clinical observations, patient history, and epidemiological information.  Fact Sheet for Patients:   StrictlyIdeas.no  Fact Sheet for Healthcare Providers: BankingDealers.co.za  This test is not yet approved or  cleared by the [place] FDA and has been authorized for detection and/or diagnosis of SARS-CoV-2 by FDA under an Emergency Use Authorization (EUA).  This EUA will remain in effect (meaning this test can be used) for the duration of the COVID-19 declaration under Section 564(b)(1) of the Act, 21 U.S.C. section 360bbb-3(b)(1), unless the authorization is terminated or revoked sooner.  Performed at [facility], [address] [place], [place] [address]   Culture, blood (routine x 2)     Status: None   Collection Time: 04/06/20 12:27 AM   Specimen: BLOOD RIGHT HAND  Result Value Ref Range Status   Specimen Description BLOOD RIGHT HAND  Final   Special Requests    Final    BOTTLES DRAWN AEROBIC ONLY Blood Culture adequate volume   Culture  Final    NO GROWTH 5 DAYS Performed at [facility], [address] [place], [place] [address]    Report Status 04/11/2020 FINAL  Final    Coagulation Studies: No results for input(s): LABPROT, INR in the last 72 hours.  Urinalysis: No results for input(s): COLORURINE, LABSPEC, PHURINE, GLUCOSEU, HGBUR, BILIRUBINUR, KETONESUR, PROTEINUR, UROBILINOGEN, NITRITE, LEUKOCYTESUR in the last 72 hours.  Invalid input(s): APPERANCEUR    Imaging: No results found.   Medications:    . aspirin EC  81 mg Oral Daily  . Chlorhexidine Gluconate Cloth  6 each Topical Q0600  . dextromethorphan-guaiFENesin  1 tablet Oral BID  . divalproex  250 mg Oral BID  . docusate sodium  100 mg Oral BID  . DULoxetine  20 mg Oral Daily  . epoetin (EPOGEN/PROCRIT) injection  10,000 Units Intravenous Q M,W,F-HD  . ferric citrate  420 mg Oral TID WC  . gabapentin  100 mg Oral BID  . heparin injection (subcutaneous)  5,000 Units Subcutaneous Q8H  . metoprolol tartrate  12.5 mg Oral BID  . mupirocin ointment   Nasal BID  . OLANZapine  15 mg Oral QHS  . pantoprazole  40 mg Oral Daily  . sodium zirconium cyclosilicate  5 g Oral Daily  . thiamine  50 mg Oral Daily   acetaminophen, ALPRAZolam, bisacodyl, nitroGLYCERIN, ondansetron (ZOFRAN) IV  Assessment/ Plan:  Ms. LELANIA BIA is a 69 y.o. black female  with end stage renal disease on hemodialysis, schizophrenia, hypertension, who was admitted to [facility] on 04/05/2020 for NSTEMI (non-ST elevated myocardial infarction) ([place]) [I21.4] Non-traumatic rhabdomyolysis [M62.82] Altered mental status, unspecified altered mental status type [E83.15] Acute metabolic encephalopathy [V76.16] Sepsis, due to unspecified organism, unspecified whether acute organ dysfunction present ([place]) [A41.9]   [facility] (Dr. Leontine Locket) 55.5kg Left AVF  1. ESRD on HD  MWF with hyperkalemia:  - low potassium diet - lokelma - she has been on this agent in the past.   2. Anemia of Chronic kidney disease:   - EPO with HD treatment.   3. Secondary hyperparathyroidism with hyperphosphatemia.  Lorin Picket.   4. Hypertension: previously on amlodipine as well. Well controlled.  - continue metoprolol.     LOS: 8 Latham Kinzler 7/22/20212:19 PM

## 2020-04-14 NOTE — Progress Notes (Signed)
Physical Therapy Treatment Patient Details Name: Madison [name] MRN: [id number] DOB: [birth year] Today's Date: 04/14/2020    History of Present Illness Madison [name] is a 69 y.o. female with medical history significant for schizophrenia and bipolar mood disorder, ESRD on HD MWF last dialysis 04/04/2020, with no missed dialysis sessions who was brought in after she was found lying on the ground at the neighbors house for an unknown period of time.  Most of the history is given by the sister who states that she was sleeping and that her sister would not have been outside for more than a few hours.    PT Comments    Patient pleasant this morning, agrees to PT. Performed bed mobility with mod independence, transferred with supervision. Steady static standing balance this session. Ambulated 100 feet with RW and min guard. No lob, short, shuffle steps. Patient seems to have fluctuation in mobility from day to day. Today she had improved ambulation, no freezing episodes, increased safety. However patient is limited by cognition and ability to follow direction consistently. She will continue to benefit from skilled PT to improve functional independence and safety with mobility. If sister unable to help her, she will need other assistance at discharge.      Follow Up Recommendations  Supervision for mobility/OOB;Home health PT     Equipment Recommendations  None recommended by PT    Recommendations for Other Services       Precautions / Restrictions Precautions Precautions: Fall Restrictions Weight Bearing Restrictions: No    Mobility  Bed Mobility Overal bed mobility: Modified Independent Bed Mobility: Supine to Sit     Supine to sit: Modified independent (Device/Increase time)     General bed mobility comments: HOB elevated  Transfers Overall transfer level: Modified independent Equipment used: None Transfers: Sit to/from Stand Sit to Stand: Modified independent (Device/Increase  time)         General transfer comment: patient stood from bed without assist. Steady with static standing.  Ambulation/Gait Ambulation/Gait assistance: Min guard Gait Distance (Feet): 100 Feet Assistive device: Rolling walker (2 wheeled) Gait Pattern/deviations: Step-to pattern;Shuffle;Decreased step length - right;Decreased step length - left Gait velocity: decr   General Gait Details: Patient required cues for proper use of RW, assist to keep close to RW and inside. Cued to increase step clearance and step length, but unable to change.   Stairs             Wheelchair Mobility    Modified Rankin (Stroke Patients Only)       Balance Overall balance assessment: Mild deficits observed, not formally tested Sitting-balance support: Feet supported Sitting balance-Leahy Scale: Good     Standing balance support: No upper extremity supported Standing balance-Leahy Scale: Good Standing balance comment: static standing good, ambulated with B UE support. No lob with mobility.                            Cognition Arousal/Alertness: Awake/alert Behavior During Therapy: Flat affect Overall Cognitive Status: No family/caregiver present to determine baseline cognitive functioning                                 General Comments: improved mentation this visit. DIsoriented as to how long she has been here, wants to go home. Less perseveration on negative things, sister, etc.      Exercises  General Comments        Pertinent Vitals/Pain Pain Assessment: No/denies pain    Home Living                      Prior Function            PT Goals (current goals can now be found in the care plan section) Acute Rehab PT Goals Patient Stated Goal: to go home PT Goal Formulation: With patient Time For Goal Achievement: 04/23/20 Potential to Achieve Goals: Fair Progress towards PT goals: Progressing toward goals    Frequency    Min  2X/week      PT Plan Current plan remains appropriate    Co-evaluation              AM-PAC PT "6 Clicks" Mobility   Outcome Measure  Help needed turning from your back to your side while in a flat bed without using bedrails?: None Help needed moving from lying on your back to sitting on the side of a flat bed without using bedrails?: None Help needed moving to and from a bed to a chair (including a wheelchair)?: A Little Help needed standing up from a chair using your arms (e.g., wheelchair or bedside chair)?: A Little Help needed to walk in hospital room?: A Little Help needed climbing 3-5 steps with a railing? : A Lot 6 Click Score: 19    End of Session Equipment Utilized During Treatment: Gait belt Activity Tolerance: Patient tolerated treatment well Patient left: in chair;with call bell/phone within reach;with chair alarm set Nurse Communication: Mobility status PT Visit Diagnosis: Muscle weakness (generalized) (M62.81);History of falling (Z91.81);Other abnormalities of gait and mobility (R26.89)     Time: 1443-1540 PT Time Calculation (min) (ACUTE ONLY): 14 min  Charges:  $Gait Training: 8-22 mins                     Anzlee Hinesley, PT, GCS 04/14/20,10:19 AM

## 2020-04-14 NOTE — TOC Progression Note (Signed)
Transition of Care [facility]) - Progression Note    Patient Details  Name: Madison [name] MRN: [id number] Date of Birth: 1950-10-13  Transition of Care [facility]) CM/SW Contact  Shelbie Hutching, RN Phone Number: 04/14/2020, 12:53 PM  Clinical Narrative:    Patient's sister will not let the patient go back to her home.  [name] the sister wants to think about being the patient's guardian, states "I want to sleep on it".  This RNCM has faxed referral to [facility], and [facility].  This RNCM will reach out to [place] to see about guardianship.  TB [employer] is pending.     Expected Discharge Plan: Group Home Barriers to Discharge: Continued Medical Work up  Expected Discharge Plan and Services Expected Discharge Plan: Group Home   Discharge Planning Services: CM Consult   Living arrangements for the past 2 months: Single Family Home                                       Social Determinants of Health (SDOH) Interventions    Readmission Risk Interventions No flowsheet data found.

## 2020-04-14 NOTE — Progress Notes (Signed)
PROGRESS NOTE    Madison [name]  YFV:[id number] DOB: 1951/05/31 DOA: 04/05/2020 PCP: Vidal Schwalbe, MD   Brief Narrative:  Madison [name] a 69 y.o.femalewith medical history significant forschizoaffective disorder bipolar type, ESRD on HD MWF last dialysis 04/04/2020, with no missed dialysis sessions who was brought in after she was found lying on the ground at the neighbors house for an unknown period of time and with empty pill packs found by patient's sister but unknown if patient overdosed.  Patient later admitted to taking all the pills she could find because she was "tired of it all". Sister also reported recent increased confusion with the patient, but no recent illnesses, fevers/chills or complaints of other symptoms.  In the ED, patient awake but confused.  Normal vitals.  EKG normal sinus rhythm and no acute ST-T wave changes. Pertinent labs include WBC 10.7k, CK 1711, troponin 158>>173. Lactic acid 2.8 >> 2.9.  Normal depakote level.  CXR showed mid and lower lung zones opacities concerning for pneumonia including atypical viral infection vs atelectasis. Covid-19 PCR negative. She was fluid resuscitated and treated with broad spectrum antibiotics in the ED per sepsis protocol.  Admitted to hospitalist service.    Subjective: Patient was more receptive when seen today.  Appears depressed.  She wants to go home.  She was very upset that her sister is not letting her go back.  Assessment & Plan:   Principal Problem:   Acute metabolic encephalopathy Active Problems:   ESRD on hemodialysis (HCC)   Schizoaffective disorder, bipolar type (HCC)   Rhabdomyolysis   Elevated troponin   Pneumonia   Lactic acidosis   Suspected Sepsis ([place])   Possible NSTEMI (non-ST elevated myocardial infarction) ([place])    Suspected Overdose, undetermined intent, initial encounter  Acute metabolic encephalopathy / Decompensated Schizoaffective and Bipolar Disorders. POA, patient refused to  answer any of the orientation questions today. Patient was originally found with empty pill pockets. There was some concern of intentional overdose, psychiatry was consulted and later find out that she was having some attention seeking behavior due to difficulty with her sister. Patient has normal CT head, normal ammonia, B12 high, thiamine levels pending an MRI without any acute abnormalities. Difficult family situation as sister does not want her to come back to her house. Psychiatry does not think that she qualify for inpatient behavioral health. -TOC is looking for a group home and guardianship.  Schizoaffective disorder, bipolar type - Depakote level within normal limits on admission.  Psychiatry is consulted given suicide attempt, was considered attention seeking. -Continue Zyprexa and Depakote. -Cymbalta was added by psychiatry. -TOC is looking for a group home as family cannot provide care.  Sepsis due to Community-acquired PNA / Lactic acidosis. Resolved. Initially meeting sepsis criteria with elevated lactic acid, mild leukocytosis. She was treated with broad-spectrum antibiotics, completed the course. Cultures remain negative. She was initially treated with IV Rocephin and Zithromax followed by Augmentin.  Anemia of chronic disease. Iron panel consistent with anemia of chronic disease most likely secondary to renal. -Continue to monitor -Transfuse if below 7.  Nontraumatic rhabdomyolysis. CK elevated on presentation. Unable to give much IV fluid as patient is on HD. It was trending down when last checked on 04/08/2020  ESRD on hemodialysis M/W/F - had not missed any sessions except day of admission. -Patient continued to get her scheduled dialysis.  GERD. Continue with Protonix.  Neuropathy. Gabapentin dose was held due to altered mental status initially. -Restart home dose of gabapentin.  Elevated troponin. No chest pain, and trending down. Cardiology was consulted and they  think that insignificant T wave changes were due to hypertensive urgency. Initially treated with heparin infusion which was discontinued but cardiology. -Continue aspirin, statin and beta-blocker.  Objective: Vitals:   04/13/20 2335 04/14/20 0434 04/14/20 0904 04/14/20 1252  BP: (!) 111/47 104/62 (!) 100/58 (!) 102/61  Pulse: 74 71 66 65  Resp: 20 20 18 18   Temp: 98.7 F (37.1 C) 98.6 F (37 C) 98.6 F (37 C) 98.5 F (36.9 C)  TempSrc: Oral Oral Oral Oral  SpO2: 100% 99% 100% 100%  Weight:      Height:        Intake/Output Summary (Last 24 hours) at 04/14/2020 1614 Last data filed at 04/14/2020 1404 Gross per 24 hour  Intake 600 ml  Output --  Net 600 ml   Filed Weights   04/05/20 1901 04/06/20 1752 04/07/20 2841  Weight: 71.7 kg 56.1 kg 68.9 kg    Examination:  General.  Well-developed lady, in no acute distress. Pulmonary.  Lungs clear bilaterally, normal respiratory effort. CV.  Regular rate and rhythm, no JVD, rub or murmur. Abdomen.  Soft, nontender, nondistended, BS positive. CNS.  Alert and oriented x3.  No focal neurologic deficit. Extremities.  No edema, no cyanosis, pulses intact and symmetrical. Psychiatry.  Judgment and insight appears normal.  Appears depressed.  DVT prophylaxis: Heparin Code Status: Full Family Communication: No family at bedside Disposition Plan:  Status is: Inpatient  Remains inpatient appropriate because:Unsafe d/c plan   Dispo:  Patient From: Home  Planned Disposition: Group home  Expected discharge date: 04/11/20  Medically stable for discharge: yes   Consultants:   Nephrology  Psychiatry  Cardiology  Procedures:  Antimicrobials:   Data Reviewed: I have personally reviewed following labs and imaging studies  CBC: Recent Labs  Lab 04/08/20 0433 04/09/20 0543 04/11/20 0432 04/12/20 0459 04/13/20 0528  WBC 5.3 5.4 6.1 6.5 5.4  NEUTROABS 3.3 3.3  --   --   --   HGB 9.8* 10.3* 9.4* 10.4* 9.2*  HCT 28.8*  31.7* 26.9* 31.0* 27.5*  MCV 96.0 99.7 94.1 98.4 98.6  PLT 154 146* 143* 133* 324   Basic Metabolic Panel: Recent Labs  Lab 04/08/20 0433 04/08/20 0433 04/09/20 0543 04/09/20 0543 04/10/20 0352 04/11/20 0432 04/12/20 0459 04/12/20 1448 04/13/20 0528  NA 137   < > 135  --  132* 127*  129* 133*  --  130*  K 4.2   < > 4.9   < > 4.8 5.6*  5.6* 6.0* 6.0* 5.7*  CL 99   < > 95*  --  94* 91*  94* 94*  --  91*  CO2 26   < > 28  --  24 22  24 24   --  25  GLUCOSE 88   < > 95  --  92 88  92 75  --  88  BUN 58*   < > 47*  --  65* 87*  90* 55*  --  77*  CREATININE 7.50*   < > 6.17*  --  8.04* 9.53*  9.40* 7.23*  --  9.10*  CALCIUM 8.2*   < > 8.9  --  8.6* 8.5*  8.5* 9.0  --  8.8*  MG 1.8  --  1.7  --   --   --  1.7  --   --   PHOS  --   --   --   --   --  5.9*  --   --   --    < > = values in this interval not displayed.   GFR: Estimated Creatinine Clearance: 5.1 mL/min (A) (by C-G formula based on SCr of 9.1 mg/dL (H)). Liver Function Tests: Recent Labs  Lab 04/10/20 0352 04/11/20 0432  AST 66*  --   ALT 29  --   ALKPHOS 53  --   BILITOT 1.1  --   PROT 6.2*  --   ALBUMIN 2.7* 2.4*   No results for input(s): LIPASE, AMYLASE in the last 168 hours. Recent Labs  Lab 04/08/20 1330  AMMONIA 26   Coagulation Profile: No results for input(s): INR, PROTIME in the last 168 hours. Cardiac Enzymes: Recent Labs  Lab 04/08/20 0433  CKTOTAL 1,169*   BNP (last 3 results) No results for input(s): PROBNP in the last 8760 hours. HbA1C: No results for input(s): HGBA1C in the last 72 hours. CBG: Recent Labs  Lab 04/11/20 2054  GLUCAP 99   Lipid Profile: No results for input(s): CHOL, HDL, LDLCALC, TRIG, CHOLHDL, LDLDIRECT in the last 72 hours. Thyroid Function Tests: Recent Labs    04/12/20 0459  TSH 5.097*  FREET4 0.77   Anemia Panel: No results for input(s): VITAMINB12, FOLATE, FERRITIN, TIBC, IRON, RETICCTPCT in the last 72 hours. Sepsis Labs: No results for  input(s): PROCALCITON, LATICACIDVEN in the last 168 hours.  Recent Results (from the past 240 hour(s))  Culture, blood (routine x 2)     Status: None   Collection Time: 04/05/20 11:08 PM   Specimen: BLOOD RIGHT FOREARM  Result Value Ref Range Status   Specimen Description BLOOD RIGHT FOREARM  Final   Special Requests   Final    BOTTLES DRAWN AEROBIC ONLY Blood Culture adequate volume   Culture   Final    NO GROWTH 5 DAYS Performed at [facility], [address] [place], [place] [address]    Report Status 04/11/2020 FINAL  Final  MRSA PCR Screening     Status: Abnormal   Collection Time: 04/05/20 11:15 PM   Specimen: Nasopharyngeal  Result Value Ref Range Status   MRSA by PCR POSITIVE (A) NEGATIVE Final    Comment:        The GeneXpert MRSA Assay (FDA approved for NASAL specimens only), is one component of a comprehensive MRSA colonization surveillance program. It is not intended to diagnose MRSA infection nor to guide or monitor treatment for MRSA infections. RESULT CALLED TO, READ BACK BY AND VERIFIED WITH: LILLY WALKER@[email or web address]  ON 04/07/20 BY HKP Performed at [facility], [address] [place],  [address]   SARS Coronavirus 2 by RT PCR (hospital order, performed in [facility] hospital lab) Nasopharyngeal Nasopharyngeal Swab     Status: None   Collection Time: 04/05/20 11:31 PM   Specimen: Nasopharyngeal Swab  Result Value Ref Range Status   SARS Coronavirus 2 NEGATIVE NEGATIVE Final    Comment: (NOTE) SARS-CoV-2 target nucleic acids are NOT DETECTED.  The SARS-CoV-2 RNA is generally detectable in upper and lower respiratory specimens during the acute phase of infection. The lowest concentration of SARS-CoV-2 viral copies this assay can detect is 250 copies / mL. A negative result does not preclude SARS-CoV-2 infection and should not be used as the sole basis for treatment or other patient management decisions.  A negative result may occur  with improper specimen collection / handling, submission of specimen other than nasopharyngeal swab, presence of viral mutation(s) within the areas targeted by  this assay, and inadequate number of viral copies (<250 copies / mL). A negative result must be combined with clinical observations, patient history, and epidemiological information.  Fact Sheet for Patients:   StrictlyIdeas.no  Fact Sheet for Healthcare Providers: BankingDealers.co.za  This test is not yet approved or  cleared by the [place] FDA and has been authorized for detection and/or diagnosis of SARS-CoV-2 by FDA under an Emergency Use Authorization (EUA).  This EUA will remain in effect (meaning this test can be used) for the duration of the COVID-19 declaration under Section 564(b)(1) of the Act, 21 U.S.C. section 360bbb-3(b)(1), unless the authorization is terminated or revoked sooner.  Performed at [facility], [address] [place], [place] [address]   Culture, blood (routine x 2)     Status: None   Collection Time: 04/06/20 12:27 AM   Specimen: BLOOD RIGHT HAND  Result Value Ref Range Status   Specimen Description BLOOD RIGHT HAND  Final   Special Requests   Final    BOTTLES DRAWN AEROBIC ONLY Blood Culture adequate volume   Culture   Final    NO GROWTH 5 DAYS Performed at [facility], [address] [place], [place] [address]    Report Status 04/11/2020 FINAL  Final     Radiology Studies: No results found.  Scheduled Meds: . aspirin EC  81 mg Oral Daily  . Chlorhexidine Gluconate Cloth  6 each Topical Q0600  . dextromethorphan-guaiFENesin  1 tablet Oral BID  . divalproex  250 mg Oral BID  . docusate sodium  100 mg Oral BID  . DULoxetine  20 mg Oral Daily  . epoetin (EPOGEN/PROCRIT) injection  10,000 Units Intravenous Q M,W,F-HD  . ferric citrate  420 mg Oral TID WC  . gabapentin  100 mg Oral BID  . heparin injection  (subcutaneous)  5,000 Units Subcutaneous Q8H  . metoprolol tartrate  12.5 mg Oral BID  . mupirocin ointment   Nasal BID  . OLANZapine  15 mg Oral QHS  . pantoprazole  40 mg Oral Daily  . sodium zirconium cyclosilicate  5 g Oral Daily  . thiamine  50 mg Oral Daily   Continuous Infusions:   LOS: 8 days   Time spent: 20 minutes. I personally reviewed her chart.  Lorella Nimrod, MD [facility]  If 7PM-7AM, please contact night-coverage [email or web address]  04/14/2020, 4:14 PM   This record has been created using Systems analyst. Errors have been sought and corrected,but may not always be located. Such creation errors do not reflect on the standard of care.

## 2020-04-15 DIAGNOSIS — R778 Other specified abnormalities of plasma proteins: Secondary | ICD-10-CM | POA: Diagnosis not present

## 2020-04-15 DIAGNOSIS — G9341 Metabolic encephalopathy: Secondary | ICD-10-CM | POA: Diagnosis not present

## 2020-04-15 DIAGNOSIS — I214 Non-ST elevation (NSTEMI) myocardial infarction: Secondary | ICD-10-CM | POA: Diagnosis not present

## 2020-04-15 DIAGNOSIS — J189 Pneumonia, unspecified organism: Secondary | ICD-10-CM | POA: Diagnosis not present

## 2020-04-15 LAB — RENAL FUNCTION PANEL
Albumin: 2.6 g/dL — ABNORMAL LOW (ref 3.5–5.0)
Anion gap: 16 — ABNORMAL HIGH (ref 5–15)
BUN: 71 mg/dL — ABNORMAL HIGH (ref 8–23)
CO2: 24 mmol/L (ref 22–32)
Calcium: 9.2 mg/dL (ref 8.9–10.3)
Chloride: 92 mmol/L — ABNORMAL LOW (ref 98–111)
Creatinine, Ser: 8.24 mg/dL — ABNORMAL HIGH (ref 0.44–1.00)
GFR calc Af Amer: 5 mL/min — ABNORMAL LOW (ref >60)
GFR calc non Af Amer: 4 mL/min — ABNORMAL LOW (ref >60)
Glucose, Bld: 143 mg/dL — ABNORMAL HIGH (ref 70–99)
Phosphorus: 5.6 mg/dL — ABNORMAL HIGH (ref 2.5–4.6)
Potassium: 4.6 mmol/L (ref 3.5–5.1)
Sodium: 132 mmol/L — ABNORMAL LOW (ref 135–145)

## 2020-04-15 LAB — CBC
HCT: 27.9 % — ABNORMAL LOW (ref 36.0–46.0)
Hemoglobin: 9.6 g/dL — ABNORMAL LOW (ref 12.0–15.0)
MCH: 32.9 pg (ref 26.0–34.0)
MCHC: 34.4 g/dL (ref 30.0–36.0)
MCV: 95.5 fL (ref 80.0–100.0)
Platelets: 218 10*3/uL (ref 150–400)
RBC: 2.92 MIL/uL — ABNORMAL LOW (ref 3.87–5.11)
RDW: 16.1 % — ABNORMAL HIGH (ref 11.5–15.5)
WBC: 4.3 10*3/uL (ref 4.0–10.5)
nRBC: 0 % (ref 0.0–0.2)

## 2020-04-15 NOTE — Consult Note (Signed)
[facility] Face-to-Face Psychiatry Consult   Reason for Consult:  [place] Referring Physician:  Dr. Billie Ruddy Patient Identification: Madison [name] MRN:  [id number] Principal Diagnosis: Acute metabolic encephalopathy Diagnosis:  Principal Problem:   Acute metabolic encephalopathy Active Problems:   Schizoaffective disorder, bipolar type ([place])   Rhabdomyolysis   Suspected Sepsis ([place])   ESRD on hemodialysis ([place])   Elevated troponin   Pneumonia   Lactic acidosis   Possible NSTEMI (non-ST elevated myocardial infarction) ([place])    Suspected Overdose, undetermined intent, initial encounter   Total Time spent with patient: 25 minutes  Subjective: "I'm tired."  Patient seen and evaluated in person by this provider for capacity.  Client reported that she was feeling tired and had a very low volume of speech.  She does know where she is as she can identify that she is at [facility] but does not understand why she is here.  Rambling at times on the assessment with irrelevant information at times, confusion and mumbling frequently.  This provider did ask if she was amenable to going to a facility to receive assistance with her care and she stated "I suppose I could."  Her sitter was questioned about her cognitive state and reports that when she talks it is typically irrelevant information.  She does not have capacity at this time to make medical decisions, appears to be agreeable for plan to continue in a facility for her care.  04/12/20: Patient seen and evaluated in person by this provider.  Reports she heard voices in the past, none now.  Not having visual hallucinations either, not responding to internal stimuli on assessment.  She stated she was at [facility] and was here before.  States her psychiatric history of seeing Dr. Loni Muse Darleene Cleaver, outpatient psychiatrist) and he was "a nice doctor, he had my best intentions, I liked him.  Then, my family had me go see this doctor in our town."  No suicidal/homicidal  ideations, hallucinations, mania, paranoia, or other concerning psychiatric issues on assessment besides  Her self report of her "nerves" at times.  Based on her assessment, no medication changes warranted or inpatient psychiatric hospitalization.  HPI: Madison [name] is a 69 y.o. female patient with medical history significant for schizophrenia and bipolar mood disorder, ESRD on HD MWF last dialysis 04/04/2020, with no missed dialysis sessions who was brought in after she was found lying on the ground at the neighbors house for an unknown period of time.   Consult from Dr Leverne Humbles on 7/18: Psychiatry consultation is requested for evaluation of mental status and suicidal intent.  Past Psychiatric History: Schizophrenia and bipolar mood disorder  On evaluation today, patient is awake, alert oriented to person, place, date and situation.  Her speech and movements are slow.  Patient describes that she has been eating and sleeping well and she denies problems with concentration or energy.  She states her mood as, "pretty good."  She specifically denies any suicidal ideation, plan, or intent.  She denies homicidal ideation.  She denies any auditory or visual hallucinations, and does not appear to be responding to internal stimuli.  She denies any specific issues/concerns regarding living with her sister currently, but states she is uncertain if she will be able to live with her sister after discharge.  Risk to Self:  No Risk to Others:  No Prior Inpatient Therapy:  No Prior Outpatient Therapy:  Not clarified  Past Medical History:  Past Medical History:  Diagnosis Date  . Anxiety   .  ESRD (end stage renal disease) on dialysis ([place])   . Neuropathy   . Schizophrenia [facility])     Past Surgical History:  Procedure Laterality Date  . CHOLECYSTECTOMY    . COLONOSCOPY N/A 01/20/2016   Dr. Oneida Alar: 12 mm tubular adenoma removed from the transverse colon, 4 hyperplastic polyps removed from the rectum and  sigmoid colon.  Next colonoscopy planned for April 2020.  Marland Kitchen ESOPHAGOGASTRODUODENOSCOPY N/A 12/19/2017   Procedure: ESOPHAGOGASTRODUODENOSCOPY (EGD);  Surgeon: [name], MD;  Location: [facility] END[facility];  Service: Endoscopy;  Laterality: N/A;  8:30am  . fistula left arm    . GIVENS CAPSULE STUDY N/A 01/14/2018   Procedure: GIVENS CAPSULE STUDY;  Surgeon: [name], MD;  Location: [facility] END[facility] SU[facility];  Service: Endoscopy;  Laterality: N/A;  7:30am  . HEMICOLECTOMY Right   . TUBAL LIGATION     Family History:  Family History  Problem Relation Age of Onset  . Alzheimer's disease Mother   . Cancer Mother        pancreatic  . Diabetes Mother   . Prostate cancer Father   . Kidney failure Sister   . Breast cancer Neg Hx   . Colon cancer Neg Hx    Family Psychiatric  History: None  Social History:  Social History   Substance and Sexual Activity  Alcohol Use No     Social History   Substance and Sexual Activity  Drug Use No    Social History   Socioeconomic History  . Marital status: Single    Spouse name: Not on file  . Number of children: Not on file  . Years of education: Not on file  . Highest education level: Not on file  Occupational History  . Not on file  Tobacco Use  . Smoking status: Never Smoker  . Smokeless tobacco: Never Used  Vaping Use  . Vaping Use: Never used  Substance and Sexual Activity  . Alcohol use: No  . Drug use: No  . Sexual activity: Yes  Other Topics Concern  . Not on file  Social History Narrative  . Not on file   Social Determinants of Health   Financial Resource Strain:   . Difficulty of Paying Living Expenses:   Food Insecurity:   . Worried About Charity fundraiser in the Last Year:   . Arboriculturist in the Last Year:   Transportation Needs:   . Film/video editor (Medical):   Marland Kitchen Lack of Transportation (Non-Medical):   Physical Activity:   . Days of Exercise per Week:   . Minutes of Exercise per Session:   Stress:    . Feeling of Stress :   Social Connections:   . Frequency of Communication with Friends and Family:   . Frequency of Social Gatherings with Friends and Family:   . Attends Religious Services:   . Active Member of Clubs or Organizations:   . Attends Archivist Meetings:   Marland Kitchen Marital Status:    Additional Social History:  Living with sister  Allergies:  No Known Allergies  Labs:  Results for orders placed or performed during the hospital encounter of 04/05/20 (from the past 48 hour(s))  Renal function panel     Status: Abnormal   Collection Time: 04/15/20  9:30 AM  Result Value Ref Range   Sodium 132 (L) 135 - 145 mmol/L   Potassium 4.6 3.5 - 5.1 mmol/L   Chloride 92 (L) 98 - 111 mmol/L  CO2 24 22 - 32 mmol/L   Glucose, Bld 143 (H) 70 - 99 mg/dL    Comment: Glucose reference range applies only to samples taken after fasting for at least 8 hours.   BUN 71 (H) 8 - 23 mg/dL   Creatinine, Ser 8.24 (H) 0.44 - 1.00 mg/dL   Calcium 9.2 8.9 - 10.3 mg/dL   Phosphorus 5.6 (H) 2.5 - 4.6 mg/dL   Albumin 2.6 (L) 3.5 - 5.0 g/dL   GFR calc non Af Amer 4 (L) >60 mL/min   GFR calc Af Amer 5 (L) >60 mL/min   Anion gap 16 (H) 5 - 15    Comment: Performed at [facility], [address] [place], [place] [address]  CBC     Status: Abnormal   Collection Time: 04/15/20  9:30 AM  Result Value Ref Range   WBC 4.3 4.0 - 10.5 K/uL   RBC 2.92 (L) 3.87 - 5.11 MIL/uL   Hemoglobin 9.6 (L) 12.0 - 15.0 g/dL   HCT 27.9 (L) 36 - 46 %   MCV 95.5 80.0 - 100.0 fL   MCH 32.9 26.0 - 34.0 pg   MCHC 34.4 30.0 - 36.0 g/dL   RDW 16.1 (H) 11.5 - 15.5 %   Platelets 218 150 - 400 K/uL   nRBC 0.0 0.0 - 0.2 %    Comment: Performed at [facility], [address], [place], [place] [address]    Current Facility-Administered Medications  Medication Dose Route Frequency Provider Last Rate Last Admin  . acetaminophen (TYLENOL) tablet 650 mg  650 mg Oral Q4H PRN Athena Masse, MD       . ALPRAZolam Duanne Moron) tablet 0.25 mg  0.25 mg Oral TID PRN Nicole Kindred A, DO   0.25 mg at 04/11/20 0227  . aspirin EC tablet 81 mg  81 mg Oral Daily Athena Masse, MD   81 mg at 04/15/20 0830  . bisacodyl (DULCOLAX) suppository 10 mg  10 mg Rectal Daily PRN Sharion Settler, NP      . Chlorhexidine Gluconate Cloth 2 % PADS 6 each  6 each Topical Q0600 Anthonette Legato, MD   6 each at 04/15/20 0557  . dextromethorphan-guaiFENesin (MUCINEX DM) 30-600 MG per 12 hr tablet 1 tablet  1 tablet Oral BID Nicole Kindred A, DO   1 tablet at 04/15/20 0831  . divalproex (DEPAKOTE) DR tablet 250 mg  250 mg Oral BID Nicole Kindred A, DO   250 mg at 04/15/20 0830  . docusate sodium (COLACE) capsule 100 mg  100 mg Oral BID Sharion Settler, NP   100 mg at 04/15/20 0830  . DULoxetine (CYMBALTA) DR capsule 20 mg  20 mg Oral Daily Eulas Post, MD   20 mg at 04/15/20 0829  . epoetin alfa (EPOGEN) injection 10,000 Units  10,000 Units Intravenous Q M,W,F-HD Kolluru, Sarath, MD   10,000 Units at 04/13/20 1021  . ferric citrate (AURYXIA) tablet 420 mg  420 mg Oral TID WC Kolluru, Sarath, MD   420 mg at 04/15/20 0828  . gabapentin (NEURONTIN) capsule 100 mg  100 mg Oral BID Lorella Nimrod, MD   100 mg at 04/15/20 0829  . heparin injection 5,000 Units  5,000 Units Subcutaneous Q8H Nicole Kindred A, DO   5,000 Units at 04/15/20 0602  . metoprolol tartrate (LOPRESSOR) tablet 12.5 mg  12.5 mg Oral BID Nicole Kindred A, DO   12.5 mg at 04/14/20 2017  . mupirocin ointment (BACTROBAN) 2 %  Nasal BID Ezekiel Slocumb, DO   Given at 04/15/20 9735  . nitroGLYCERIN (NITROSTAT) SL tablet 0.4 mg  0.4 mg Sublingual Q5 Min x 3 PRN Athena Masse, MD      . OLANZapine (ZYPREXA) tablet 15 mg  15 mg Oral QHS Nicole Kindred A, DO   15 mg at 04/14/20 2017  . ondansetron (ZOFRAN) injection 4 mg  4 mg Intravenous Q6H PRN Athena Masse, MD      . pantoprazole (PROTONIX) EC tablet 40 mg  40 mg Oral Daily Nicole Kindred A, DO    40 mg at 04/15/20 0831  . sodium zirconium cyclosilicate (LOKELMA) packet 5 g  5 g Oral Daily Kolluru, Sarath, MD   5 g at 04/15/20 0905  . thiamine tablet 50 mg  50 mg Oral Daily Nicole Kindred A, DO   50 mg at 04/15/20 3299    Musculoskeletal: Strength & Muscle Tone: decreased Gait & Station: did not witness Patient leans: None  Psychiatric Specialty Exam: Physical Exam Vitals and nursing note reviewed.  Constitutional:      Comments: Frail and thin  HENT:     Head: Normocephalic and atraumatic.  Cardiovascular:     Rate and Rhythm: Normal rate.  Pulmonary:     Effort: Pulmonary effort is normal. No respiratory distress.  Neurological:     General: No focal deficit present.     Mental Status: She is alert.  Psychiatric:        Attention and Perception: Attention and perception normal.        Mood and Affect: Affect is blunt.        Speech: Speech normal.        Behavior: Behavior is slowed.        Thought Content: Thought content normal.        Cognition and Memory: Cognition is impaired. Memory is impaired.        Judgment: Judgment normal.     Comments: Very soft voice     Review of Systems  Constitutional: Activity change: decreased.  Respiratory: Negative.   Cardiovascular: Negative.   Gastrointestinal: Negative.   Musculoskeletal: Negative.   Skin: Negative.   Allergic/Immunologic: Negative.   Neurological: Negative.   Hematological: Negative.   Psychiatric/Behavioral: Positive for confusion (slowed) and dysphoric mood. Negative for agitation, behavioral problems, hallucinations and suicidal ideas.  All other systems reviewed and are negative.   Blood pressure (!) 105/51, pulse 61, temperature 98.2 F (36.8 C), temperature source Oral, resp. rate 18, height 5\' 1"  (1.549 m), weight 68.9 kg, SpO2 99 %.Body mass index is 28.72 kg/m.  General Appearance: Casual  Eye Contact:  Good  Speech:  Slow   Volume:  Decreased  Mood:  Dysphoric  Affect:  Blunt   Thought Process: Confused at times  Orientation:  Person and place  Thought Content:  Irrelevant  Suicidal Thoughts:  No  Homicidal Thoughts:  No  Memory: poor   Judgement:  Fair  Insight:  Fair  Psychomotor Activity:  Decreased  Concentration:  Concentration: Good and Attention Span: Good  Recall:  Poor  Fund of Knowledge:  Good  Language:  Fair  Akathisia:  No  Handed:  Right  AIMS (if indicated):     Assets:  Communication Skills Desire for Improvement Financial Resources/Insurance Resilience  ADL's:  Impaired  Cognition:  Impaired,  Mild due to age  Sleep:        Treatment Plan Summary: Capacity:  Patient does not have capacity  to make medical decisions at this time.  She is confused on the assessment, does not know why she is in the hospital.  Agreeable to go to a facility to receive assistance with her care.  Depression and anxiety: Continue Cymbalta 20 mg daily for depression and anxiety  Schizoaffective disorder: Continue Depakote 250 mg twice daily for mood stabilization.   Recommend Depakote level.  Ammonia level within normal limits is reassuring Continue Zyprexa 15 mg daily at bedtime for schizophrenia.  Recommend weaning this down to lowest dose tolerable without return of schizophrenia symptoms. Recommend discontinuation of benzodiazepines as needed anxiety medication.  If patient has become dependent on these, consider a tapered regimen.   Disposition: No evidence of imminent risk to self or others at present.   Patient does not meet criteria for psychiatric inpatient admission. Supportive therapy provided about ongoing stressors. Appreciate social work assistance in addressing discharge placement needs.   Waylan Boga, NP 04/15/2020 2:07 PM

## 2020-04-15 NOTE — Progress Notes (Signed)
[facility]  ROUNDING NOTE   Subjective:   Seen and examined on hemodialysis treatment. Tolerating treatment well.  Mental status seems to be close to her baseline.     HEMODIALYSIS FLOWSHEET:  Blood Flow Rate (mL/min): 400 mL/min Arterial Pressure (mmHg): -160 mmHg Venous Pressure (mmHg): 180 mmHg Transmembrane Pressure (mmHg): 50 mmHg Ultrafiltration Rate (mL/min): 570 mL/min Dialysate Flow Rate (mL/min): 600 ml/min Conductivity: Machine : 13.4 Conductivity: Machine : 13.4 Dialysis Fluid Bolus: Normal Saline Bolus Amount (mL): 250 mL    Objective:  Vital signs in last 24 hours:  Temp:  [97.8 F (36.6 C)-98.5 F (36.9 C)] 97.8 F (36.6 C) (07/23 0930) Pulse Rate:  [54-73] 57 (07/23 1100) Resp:  [13-18] 14 (07/23 1100) BP: (102-127)/(48-78) 104/49 (07/23 1100) SpO2:  [95 %-100 %] 97 % (07/23 0930)  Weight change:  Filed Weights   04/05/20 1901 04/06/20 1752 04/07/20 0632  Weight: 71.7 kg 56.1 kg 68.9 kg    Intake/Output: I/O last 3 completed shifts: In: 480 [P.O.:480] Out: 0    Intake/Output this shift:  Total I/O In: 120 [P.O.:120] Out: -   Physical Exam: General: NAD,   Head: Normocephalic, atraumatic. Moist oral mucosal membranes  Eyes: Anicteric, PERRL  Neck: Supple, trachea midline  Lungs:  Clear to auscultation  Heart: Regular rate and rhythm  Abdomen:  Soft, nontender,   Extremities:  No peripheral edema.  Neurologic: Alert to self and place, pleasant  Skin: No lesions  Access:  left  Upper extremity AVF     Basic Metabolic Panel: Recent Labs  Lab 04/09/20 0543 04/09/20 0543 04/10/20 0352 04/10/20 0352 04/11/20 0432 04/11/20 0432 04/12/20 0459 04/12/20 1448 04/13/20 0528 04/15/20 0930  NA 135   < > 132*  --  127*  129*  --  133*  --  130* 132*  K 4.9   < > 4.8   < > 5.6*  5.6*  --  6.0* 6.0* 5.7* 4.6  CL 95*   < > 94*  --  91*  94*  --  94*  --  91* 92*  CO2 28   < > 24  --  22  24  --  24  --  25 24  GLUCOSE 95    < > 92  --  88  92  --  75  --  88 143*  BUN 47*   < > 65*  --  87*  90*  --  55*  --  77* 71*  CREATININE 6.17*   < > 8.04*  --  9.53*  9.40*  --  7.23*  --  9.10* 8.24*  CALCIUM 8.9   < > 8.6*   < > 8.5*  8.5*   < > 9.0  --  8.8* 9.2  MG 1.7  --   --   --   --   --  1.7  --   --   --   PHOS  --   --   --   --  5.9*  --   --   --   --  5.6*   < > = values in this interval not displayed.    Liver Function Tests: Recent Labs  Lab 04/10/20 0352 04/11/20 0432 04/15/20 0930  AST 66*  --   --   ALT 29  --   --   ALKPHOS 53  --   --   BILITOT 1.1  --   --   PROT 6.2*  --   --  ALBUMIN 2.7* 2.4* 2.6*   No results for input(s): LIPASE, AMYLASE in the last 168 hours. Recent Labs  Lab 04/08/20 1330  AMMONIA 26    CBC: Recent Labs  Lab 04/09/20 0543 04/11/20 0432 04/12/20 0459 04/13/20 0528 04/15/20 0930  WBC 5.4 6.1 6.5 5.4 4.3  NEUTROABS 3.3  --   --   --   --   HGB 10.3* 9.4* 10.4* 9.2* 9.6*  HCT 31.7* 26.9* 31.0* 27.5* 27.9*  MCV 99.7 94.1 98.4 98.6 95.5  PLT 146* 143* 133* 153 218    Cardiac Enzymes: No results for input(s): CKTOTAL, CKMB, CKMBINDEX, TROPONINI in the last 168 hours.  BNP: Invalid input(s): POCBNP  CBG: Recent Labs  Lab 04/11/20 2054  GLUCAP 37    Microbiology: Results for orders placed or performed during the hospital encounter of 04/05/20  Culture, blood (routine x 2)     Status: None   Collection Time: 04/05/20 11:08 PM   Specimen: BLOOD RIGHT FOREARM  Result Value Ref Range Status   Specimen Description BLOOD RIGHT FOREARM  Final   Special Requests   Final    BOTTLES DRAWN AEROBIC ONLY Blood Culture adequate volume   Culture   Final    NO GROWTH 5 DAYS Performed at [facility], [address] [place], [place] [address]    Report Status 04/11/2020 FINAL  Final  MRSA PCR Screening     Status: Abnormal   Collection Time: 04/05/20 11:15 PM   Specimen: Nasopharyngeal  Result Value Ref Range Status   MRSA by PCR  POSITIVE (A) NEGATIVE Final    Comment:        The GeneXpert MRSA Assay (FDA approved for NASAL specimens only), is one component of a comprehensive MRSA colonization surveillance program. It is not intended to diagnose MRSA infection nor to guide or monitor treatment for MRSA infections. RESULT CALLED TO, READ BACK BY AND VERIFIED WITH: LILLY WALKER@1320  ON 04/07/20 BY HKP Performed at [facility], [place], [place] [address]   SARS Coronavirus 2 by RT PCR (hospital order, performed in [facility] hospital lab) Nasopharyngeal Nasopharyngeal Swab     Status: None   Collection Time: 04/05/20 11:31 PM   Specimen: Nasopharyngeal Swab  Result Value Ref Range Status   SARS Coronavirus 2 NEGATIVE NEGATIVE Final    Comment: (NOTE) SARS-CoV-2 target nucleic acids are NOT DETECTED.  The SARS-CoV-2 RNA is generally detectable in upper and lower respiratory specimens during the acute phase of infection. The lowest concentration of SARS-CoV-2 viral copies this assay can detect is 250 copies / mL. A negative result does not preclude SARS-CoV-2 infection and should not be used as the sole basis for treatment or other patient management decisions.  A negative result may occur with improper specimen collection / handling, submission of specimen other than nasopharyngeal swab, presence of viral mutation(s) within the areas targeted by this assay, and inadequate number of viral copies (<250 copies / mL). A negative result must be combined with clinical observations, patient history, and epidemiological information.  Fact Sheet for Patients:   StrictlyIdeas.no  Fact Sheet for Healthcare Providers: BankingDealers.co.za  This test is not yet approved or  cleared by the [place] FDA and has been authorized for detection and/or diagnosis of SARS-CoV-2 by FDA under an Emergency Use Authorization (EUA).  This EUA will  remain in effect (meaning this test can be used) for the duration of the COVID-19 declaration under Section 564(b)(1) of the Act, 21  U.S.C. section 360bbb-3(b)(1), unless the authorization is terminated or revoked sooner.  Performed at [facility], [address] [place], [place] [address]   Culture, blood (routine x 2)     Status: None   Collection Time: 04/06/20 12:27 AM   Specimen: BLOOD RIGHT HAND  Result Value Ref Range Status   Specimen Description BLOOD RIGHT HAND  Final   Special Requests   Final    BOTTLES DRAWN AEROBIC ONLY Blood Culture adequate volume   Culture   Final    NO GROWTH 5 DAYS Performed at [facility], [address], [place], [place] [address]    Report Status 04/11/2020 FINAL  Final    Coagulation Studies: No results for input(s): LABPROT, INR in the last 72 hours.  Urinalysis: No results for input(s): COLORURINE, LABSPEC, PHURINE, GLUCOSEU, HGBUR, BILIRUBINUR, KETONESUR, PROTEINUR, UROBILINOGEN, NITRITE, LEUKOCYTESUR in the last 72 hours.  Invalid input(s): APPERANCEUR    Imaging: No results found.   Medications:    . aspirin EC  81 mg Oral Daily  . Chlorhexidine Gluconate Cloth  6 each Topical Q0600  . dextromethorphan-guaiFENesin  1 tablet Oral BID  . divalproex  250 mg Oral BID  . docusate sodium  100 mg Oral BID  . DULoxetine  20 mg Oral Daily  . epoetin (EPOGEN/PROCRIT) injection  10,000 Units Intravenous Q M,W,F-HD  . ferric citrate  420 mg Oral TID WC  . gabapentin  100 mg Oral BID  . heparin injection (subcutaneous)  5,000 Units Subcutaneous Q8H  . metoprolol tartrate  12.5 mg Oral BID  . mupirocin ointment   Nasal BID  . OLANZapine  15 mg Oral QHS  . pantoprazole  40 mg Oral Daily  . sodium zirconium cyclosilicate  5 g Oral Daily  . thiamine  50 mg Oral Daily   acetaminophen, ALPRAZolam, bisacodyl, nitroGLYCERIN, ondansetron (ZOFRAN) IV  Assessment/ Plan:  [name] is a 69 y.o. black  female  with end stage renal disease on hemodialysis, schizophrenia, hypertension, who was admitted to [facility] on 04/05/2020 for NSTEMI (non-ST elevated myocardial infarction) ([place]) [I21.4] Non-traumatic rhabdomyolysis [M62.82] Altered mental status, unspecified altered mental status type [K93.81] Acute metabolic encephalopathy [W29.93] Sepsis, due to unspecified organism, unspecified whether acute organ dysfunction present ([place]) [A41.9]   [facility] (Dr. Leontine Locket) 55.5kg Left AVF  1. ESRD on HD with hyperkalemia: seen and examined on hemodialysis treatment. Tolerating treatment well. MWF schedule.  - low potassium diet - lokelma - she has been on this agent in the past. Resume this on discharge  2. Anemia of Chronic kidney disease:   - EPO with HD treatment.   3. Secondary hyperparathyroidism with hyperphosphatemia.  Lorin Picket.   4. Hypertension: previously on amlodipine as well. Well controlled.  - continue metoprolol.    Dispo: Patient is scheduled to go to a group home as family feels that they are unable to care for her anymore.    LOS: 9 Obryan Radu 7/23/202111:12 AM

## 2020-04-15 NOTE — TOC Progression Note (Signed)
Transition of Care [facility]) - Progression Note    Patient Details  Name: Madison [name] MRN: [id number] Date of Birth: 03-14-1951  [facility]) CM/SW Contact  Shelbie Ammons, RN Phone Number: 04/15/2020, 10:08 AM  Clinical Narrative:   RNCM received phone call from [facility] with [facility] who reports she was contacted this am by patient's sister who reports that she is now willing to act as patient's "responsible person". With this Delana Meyer reports that she is willing to re-evaluate patient for placement in her facility. She requests that information be re-faxed to her. RNCM re-faxed all clinicals through the hub.     Expected Discharge Plan: Group Home Barriers to Discharge: Continued Medical Work up  Expected Discharge Plan and Services Expected Discharge Plan: Group Home   Discharge Planning Services: CM Consult   Living arrangements for the past 2 months: Single Family Home                                       Social Determinants of Health (SDOH) Interventions    Readmission Risk Interventions No flowsheet data found.

## 2020-04-15 NOTE — TOC Progression Note (Signed)
Transition of Care [facility]) - Progression Note    Patient Details  Name: Madison [name] MRN: [id number] Date of Birth: Jul 10, 1951  Transition [facility]) CM/SW Contact  Shelbie Ammons, RN Phone Number: 04/15/2020, 2:37 PM  Clinical Narrative:  RNCM met with patient in room x2, communicated with attending MD and spoke with [facility] at [facility]. Facilitated a Haematologist with patient and Jasmine in room at bedside after dialysis. After interview Jasmine verbalized that she would be able to accept the patient on Monday after dialysis. RNCM communicated with bedside nurse, attending and [facility] HD liaison to let them all know new plan. [facility] verbalized that she would notify the outpatient HD center. RNCM will complete FL-2 and follow for any further needs.     Expected Discharge Plan: Group Home Barriers to Discharge: Continued Medical Work up  Expected Discharge Plan and Services Expected Discharge Plan: Group Home   Discharge Planning Services: CM Consult   Living arrangements for the past 2 months: Single Family Home                                       Social Determinants of Health (SDOH) Interventions    Readmission Risk Interventions No flowsheet data found.

## 2020-04-15 NOTE — Progress Notes (Signed)
PROGRESS NOTE    Madison [name]  ZOX:[id number] DOB: Jan 08, 1951 DOA: 04/05/2020 PCP: Vidal Schwalbe, MD   Brief Narrative:  Madison [name] a 69 y.o.femalewith medical history significant forschizoaffective disorder bipolar type, ESRD on HD MWF last dialysis 04/04/2020, with no missed dialysis sessions who was brought in after she was found lying on the ground at the neighbors house for an unknown period of time and with empty pill packs found by patient's sister but unknown if patient overdosed.  Patient later admitted to taking all the pills she could find because she was "tired of it all". Sister also reported recent increased confusion with the patient, but no recent illnesses, fevers/chills or complaints of other symptoms.  In the ED, patient awake but confused.  Normal vitals.  EKG normal sinus rhythm and no acute ST-T wave changes. Pertinent labs include WBC 10.7k, CK 1711, troponin 158>>173. Lactic acid 2.8 >> 2.9.  Normal depakote level.  CXR showed mid and lower lung zones opacities concerning for pneumonia including atypical viral infection vs atelectasis. Covid-19 PCR negative. She was fluid resuscitated and treated with broad spectrum antibiotics in the ED per sepsis protocol.  Admitted to hospitalist service.    Subjective: Pt eating well.  Kept saying that she wanted to see her sister and family.  Not making urine anymore due to ESRD.  Having BM's.     Assessment & Plan:   Principal Problem:   Acute metabolic encephalopathy Active Problems:   ESRD on hemodialysis (HCC)   Schizoaffective disorder, bipolar type (HCC)   Rhabdomyolysis   Elevated troponin   Pneumonia   Lactic acidosis   Suspected Sepsis ([place])   Possible NSTEMI (non-ST elevated myocardial infarction) ([place])    Suspected Overdose, undetermined intent, initial encounter   Acute metabolic encephalopathy / Decompensated Schizoaffective and Bipolar Disorders. POA,  Patient was originally found with  empty pill pockets. There was some concern of intentional overdose, psychiatry was consulted and later find out that she was having some attention seeking behavior due to difficulty with her sister. Patient has normal CT head, normal ammonia, B12 high, thiamine levels pending an MRI without any acute abnormalities. Psychiatry does not think that she qualify for inpatient behavioral health. PLAN: -TOC has found an assisted living facility for pt   Schizoaffective disorder, bipolar type - Depakote level within normal limits on admission.  Psychiatry is consulted given suicide attempt, was considered attention seeking. -Continue Zyprexa and Depakote. -continue Cymbalta (new by psychiatry)  Sepsis due to Community-acquired PNA, treated Lactic acidosis. Resolved. Initially meeting sepsis criteria with elevated lactic acid, mild leukocytosis. She was initially treated with IV Rocephin and Zithromax followed by Augmentin, completed the course. Cultures remain negative.   Anemia of chronic disease.  Iron panel consistent with anemia of chronic disease most likely secondary to renal. -Continue to monitor -Transfuse if below 7.  Nontraumatic rhabdomyolysis.  CK elevated on presentation. Unable to give much IV fluid as patient is on HD. It was trending down when last checked on 04/08/2020  ESRD on hemodialysis M/W/F  - had not missed any sessions except day of admission. -Patient continued to get her scheduled dialysis.  GERD. Continue with Protonix.  Neuropathy.  --continue home dose of gabapentin.  Elevated troponin due to demand ischemia No chest pain, and trending down. Cardiology was consulted and they think that insignificant T wave changes were due to hypertensive urgency. Initially treated with heparin infusion which was discontinued but cardiology. -Continue aspirin, statin and beta-blocker.  Objective: Vitals:   04/15/20 1215 04/15/20 1230 04/15/20 1245 04/15/20 1300  BP: (!)  125/56 (!) 113/63 (!) 118/51 (!) 105/51  Pulse: 58 57 61 61  Resp: 17 16 15 18   Temp:    98.2 F (36.8 C)  TempSrc:    Oral  SpO2:    99%  Weight:      Height:        Intake/Output Summary (Last 24 hours) at 04/15/2020 1551 Last data filed at 04/15/2020 1300 Gross per 24 hour  Intake 120 ml  Output 1500 ml  Net -1380 ml   Filed Weights   04/05/20 1901 04/06/20 1752 04/07/20 0632  Weight: 71.7 kg 56.1 kg 68.9 kg    Examination:  Constitutional: NAD, alert, eating a meal HEENT: conjunctivae and lids normal, EOMI CV: RRR no M,R,G. Distal pulses +2.  No cyanosis.   RESP: CTA B/L, normal respiratory effort  GI: +BS, NTND, soft Extremities: No effusions, edema, or tenderness in BLE SKIN: warm, dry and intact Neuro: II - XII grossly intact.   Psych: Depressed mood and affect.      DVT prophylaxis: Heparin Code Status: Full Family Communication:  Disposition Plan:  Status is: Inpatient  Remains inpatient appropriate because:Unsafe d/c plan   Dispo:  Patient From: Home  Planned Disposition: assisted living facility  Expected discharge date: 04/18/20, when facility will be able to accept pt   Medically stable for discharge: yes   Consultants:   Nephrology  Psychiatry  Cardiology  Procedures:  Antimicrobials:   Data Reviewed: I have personally reviewed following labs and imaging studies  CBC: Recent Labs  Lab 04/09/20 0543 04/11/20 0432 04/12/20 0459 04/13/20 0528 04/15/20 0930  WBC 5.4 6.1 6.5 5.4 4.3  NEUTROABS 3.3  --   --   --   --   HGB 10.3* 9.4* 10.4* 9.2* 9.6*  HCT 31.7* 26.9* 31.0* 27.5* 27.9*  MCV 99.7 94.1 98.4 98.6 95.5  PLT 146* 143* 133* 153 161   Basic Metabolic Panel: Recent Labs  Lab 04/09/20 0543 04/09/20 0543 04/10/20 0352 04/10/20 0352 04/11/20 0432 04/12/20 0459 04/12/20 1448 04/13/20 0528 04/15/20 0930  NA 135   < > 132*  --  127*   129* 133*  --  130* 132*  K 4.9   < > 4.8   < > 5.6*   5.6* 6.0* 6.0* 5.7* 4.6  CL  95*   < > 94*  --  91*   94* 94*  --  91* 92*  CO2 28   < > 24  --  22   24 24   --  25 24  GLUCOSE 95   < > 92  --  88   92 75  --  88 143*  BUN 47*   < > 65*  --  87*   90* 55*  --  77* 71*  CREATININE 6.17*   < > 8.04*  --  9.53*   9.40* 7.23*  --  9.10* 8.24*  CALCIUM 8.9   < > 8.6*  --  8.5*   8.5* 9.0  --  8.8* 9.2  MG 1.7  --   --   --   --  1.7  --   --   --   PHOS  --   --   --   --  5.9*  --   --   --  5.6*   < > = values in this interval not displayed.   GFR: Estimated  Creatinine Clearance: 5.6 mL/min (A) (by C-G formula based on SCr of 8.24 mg/dL (H)). Liver Function Tests: Recent Labs  Lab 04/10/20 0352 04/11/20 0432 04/15/20 0930  AST 66*  --   --   ALT 29  --   --   ALKPHOS 53  --   --   BILITOT 1.1  --   --   PROT 6.2*  --   --   ALBUMIN 2.7* 2.4* 2.6*   No results for input(s): LIPASE, AMYLASE in the last 168 hours. No results for input(s): AMMONIA in the last 168 hours. Coagulation Profile: No results for input(s): INR, PROTIME in the last 168 hours. Cardiac Enzymes: No results for input(s): CKTOTAL, CKMB, CKMBINDEX, TROPONINI in the last 168 hours. BNP (last 3 results) No results for input(s): PROBNP in the last 8760 hours. HbA1C: No results for input(s): HGBA1C in the last 72 hours. CBG: Recent Labs  Lab 04/11/20 2054  GLUCAP 99   Lipid Profile: No results for input(s): CHOL, HDL, LDLCALC, TRIG, CHOLHDL, LDLDIRECT in the last 72 hours. Thyroid Function Tests: No results for input(s): TSH, T4TOTAL, FREET4, T3FREE, THYROIDAB in the last 72 hours. Anemia Panel: No results for input(s): VITAMINB12, FOLATE, FERRITIN, TIBC, IRON, RETICCTPCT in the last 72 hours. Sepsis Labs: No results for input(s): PROCALCITON, LATICACIDVEN in the last 168 hours.  Recent Results (from the past 240 hour(s))  Culture, blood (routine x 2)     Status: None   Collection Time: 04/05/20 11:08 PM   Specimen: BLOOD RIGHT FOREARM  Result Value Ref Range Status   Specimen  Description BLOOD RIGHT FOREARM  Final   Special Requests   Final    BOTTLES DRAWN AEROBIC ONLY Blood Culture adequate volume   Culture   Final    NO GROWTH 5 DAYS Performed at [facility], [address] [place], [place] [address]    Report Status 04/11/2020 FINAL  Final  MRSA PCR Screening     Status: Abnormal   Collection Time: 04/05/20 11:15 PM   Specimen: Nasopharyngeal  Result Value Ref Range Status   MRSA by PCR POSITIVE (A) NEGATIVE Final    Comment:        The GeneXpert MRSA Assay (FDA approved for NASAL specimens only), is one component of a comprehensive MRSA colonization surveillance program. It is not intended to diagnose MRSA infection nor to guide or monitor treatment for MRSA infections. RESULT CALLED TO, READ BACK BY AND VERIFIED WITH: LILLY [email or web address]  ON 04/07/20 BY HKP Performed at [facility], [address] [place], [place] [address]   SARS Coronavirus 2 by RT PCR (hospital order, performed in [facility] hospital lab) Nasopharyngeal Nasopharyngeal Swab     Status: None   Collection Time: 04/05/20 11:31 PM   Specimen: Nasopharyngeal Swab  Result Value Ref Range Status   SARS Coronavirus 2 NEGATIVE NEGATIVE Final    Comment: (NOTE) SARS-CoV-2 target nucleic acids are NOT DETECTED.  The SARS-CoV-2 RNA is generally detectable in upper and lower respiratory specimens during the acute phase of infection. The lowest concentration of SARS-CoV-2 viral copies this assay can detect is 250 copies / mL. A negative result does not preclude SARS-CoV-2 infection and should not be used as the sole basis for treatment or other patient management decisions.  A negative result may occur with improper specimen collection / handling, submission of specimen other than nasopharyngeal swab, presence of viral mutation(s) within the areas targeted by this assay, and inadequate number of viral copies (<  250 copies / mL). A negative result must be combined  with clinical observations, patient history, and epidemiological information.  Fact Sheet for Patients:   StrictlyIdeas.no  Fact Sheet for Healthcare Providers: BankingDealers.co.za  This test is not yet approved or  cleared by the [place] FDA and has been authorized for detection and/or diagnosis of SARS-CoV-2 by FDA under an Emergency Use Authorization (EUA).  This EUA will remain in effect (meaning this test can be used) for the duration of the COVID-19 declaration under Section 564(b)(1) of the Act, 21 U.S.C. section 360bbb-3(b)(1), unless the authorization is terminated or revoked sooner.  Performed at [facility], [address] [place], [place] [address]   Culture, blood (routine x 2)     Status: None   Collection Time: 04/06/20 12:27 AM   Specimen: BLOOD RIGHT HAND  Result Value Ref Range Status   Specimen Description BLOOD RIGHT HAND  Final   Special Requests   Final    BOTTLES DRAWN AEROBIC ONLY Blood Culture adequate volume   Culture   Final    NO GROWTH 5 DAYS Performed at [facility], [address] [place], [place] [address]    Report Status 04/11/2020 FINAL  Final     Radiology Studies: No results found.  Scheduled Meds:  aspirin EC  81 mg Oral Daily   Chlorhexidine Gluconate Cloth  6 each Topical Q0600   dextromethorphan-guaiFENesin  1 tablet Oral BID   divalproex  250 mg Oral BID   docusate sodium  100 mg Oral BID   DULoxetine  20 mg Oral Daily   epoetin (EPOGEN/PROCRIT) injection  10,000 Units Intravenous Q M,W,F-HD   ferric citrate  420 mg Oral TID WC   gabapentin  100 mg Oral BID   heparin injection (subcutaneous)  5,000 Units Subcutaneous Q8H   metoprolol tartrate  12.5 mg Oral BID   mupirocin ointment   Nasal BID   OLANZapine  15 mg Oral QHS   pantoprazole  40 mg Oral Daily   sodium zirconium cyclosilicate  5 g Oral Daily   thiamine  50 mg Oral Daily     Continuous Infusions:   LOS: 9 days    Enzo Bi, MD [facility]  If 7PM-7AM, please contact night-coverage [email or web address]  04/15/2020, 3:51 PM

## 2020-04-16 DIAGNOSIS — J189 Pneumonia, unspecified organism: Secondary | ICD-10-CM | POA: Diagnosis not present

## 2020-04-16 DIAGNOSIS — R778 Other specified abnormalities of plasma proteins: Secondary | ICD-10-CM | POA: Diagnosis not present

## 2020-04-16 DIAGNOSIS — I214 Non-ST elevation (NSTEMI) myocardial infarction: Secondary | ICD-10-CM | POA: Diagnosis not present

## 2020-04-16 LAB — BASIC METABOLIC PANEL
Anion gap: 12 (ref 5–15)
BUN: 47 mg/dL — ABNORMAL HIGH (ref 8–23)
CO2: 27 mmol/L (ref 22–32)
Calcium: 8.8 mg/dL — ABNORMAL LOW (ref 8.9–10.3)
Chloride: 96 mmol/L — ABNORMAL LOW (ref 98–111)
Creatinine, Ser: 5.55 mg/dL — ABNORMAL HIGH (ref 0.44–1.00)
GFR calc Af Amer: 8 mL/min — ABNORMAL LOW (ref >60)
GFR calc non Af Amer: 7 mL/min — ABNORMAL LOW (ref >60)
Glucose, Bld: 88 mg/dL (ref 70–99)
Potassium: 4.2 mmol/L (ref 3.5–5.1)
Sodium: 135 mmol/L (ref 135–145)

## 2020-04-16 LAB — CBC
HCT: 28.2 % — ABNORMAL LOW (ref 36.0–46.0)
Hemoglobin: 9.6 g/dL — ABNORMAL LOW (ref 12.0–15.0)
MCH: 33.4 pg (ref 26.0–34.0)
MCHC: 34 g/dL (ref 30.0–36.0)
MCV: 98.3 fL (ref 80.0–100.0)
Platelets: 217 10*3/uL (ref 150–400)
RBC: 2.87 MIL/uL — ABNORMAL LOW (ref 3.87–5.11)
RDW: 16.6 % — ABNORMAL HIGH (ref 11.5–15.5)
WBC: 6.1 10*3/uL (ref 4.0–10.5)
nRBC: 0 % (ref 0.0–0.2)

## 2020-04-16 LAB — MAGNESIUM: Magnesium: 1.5 mg/dL — ABNORMAL LOW (ref 1.7–2.4)

## 2020-04-16 MED ORDER — MAGNESIUM SULFATE 50 % IJ SOLN: 1.0000 g | Freq: Once | INTRAMUSCULAR | Status: DC

## 2020-04-16 MED ORDER — MAGNESIUM SULFATE IN D5W 1-5 GM/100ML-% IV SOLN
1.0000 g | Freq: Once | INTRAVENOUS | Status: AC
  Administered 2020-04-16: 13:00:00 1 g via INTRAVENOUS
  Filled 2020-04-16: qty 100

## 2020-04-16 NOTE — Progress Notes (Addendum)
PROGRESS NOTE    KAM KUSHNIR  WUJ:[id number] DOB: Jun 06, 1951 DOA: 04/05/2020 PCP: Vidal Schwalbe, MD   Brief Narrative:  Madison [name] a 69 y.o.femalewith medical history significant forschizoaffective disorder bipolar type, ESRD on HD MWF last dialysis 04/04/2020, with no missed dialysis sessions who was brought in after she was found lying on the ground at the neighbors house for an unknown period of time and with empty pill packs found by patient's sister but unknown if patient overdosed.  Patient later admitted to taking all the pills she could find because she was "tired of it all". Sister also reported recent increased confusion with the patient, but no recent illnesses, fevers/chills or complaints of other symptoms.  In the ED, patient awake but confused.  Normal vitals.  EKG normal sinus rhythm and no acute ST-T wave changes. Pertinent labs include WBC 10.7k, CK 1711, troponin 158>>173. Lactic acid 2.8 >> 2.9.  Normal depakote level.  CXR showed mid and lower lung zones opacities concerning for pneumonia including atypical viral infection vs atelectasis. Covid-19 PCR negative. She was fluid resuscitated and treated with broad spectrum antibiotics in the ED per sepsis protocol.  Admitted to hospitalist service.    Subjective: Pt wanted her family to call her or come see her.  No complaints.  Pt confused about why she is in the hospital.     Assessment & Plan:   Principal Problem:   Acute metabolic encephalopathy Active Problems:   ESRD on hemodialysis (HCC)   Schizoaffective disorder, bipolar type (HCC)   Rhabdomyolysis   Elevated troponin   Pneumonia   Lactic acidosis   Suspected Sepsis ([place])   Possible NSTEMI (non-ST elevated myocardial infarction) ([place])    Suspected Overdose, undetermined intent, initial encounter   Acute metabolic encephalopathy / Decompensated Schizoaffective and Bipolar Disorders. POA,  Patient was originally found with empty pill pockets.  There was some concern of intentional overdose, psychiatry was consulted and later find out that she was having some attention seeking behavior due to difficulty with her sister. Patient has normal CT head, normal ammonia, B12 high, thiamine levels pending an MRI without any acute abnormalities. Psychiatry does not think that she qualify for inpatient behavioral health. PLAN: -TOC has found an assisted living facility for pt   Schizoaffective disorder, bipolar type - Depakote level within normal limits on admission.  Psychiatry is consulted given suicide attempt, was considered attention seeking. -Continue Zyprexa and Depakote. -continue Cymbalta (new by psychiatry)  Sepsis due to Community-acquired PNA, treated Lactic acidosis. Resolved. Initially meeting sepsis criteria with elevated lactic acid, mild leukocytosis. She was initially treated with IV Rocephin and Zithromax followed by Augmentin, completed the course. Cultures remain negative.   Anemia of chronic disease.  Iron panel consistent with anemia of chronic disease most likely secondary to renal. -Continue to monitor -Transfuse if below 7 --Epogen with dialysis  Nontraumatic rhabdomyolysis.  CK elevated on presentation. Unable to give much IV fluid as patient is on HD. It was trending down when last checked on 04/08/2020  ESRD on hemodialysis M/W/F  Hyperkalemia - had not missed any sessions except day of admission. --HD per nephrology --potassium removal with dialysis  Secondary hyperparathyroidism with hyperphosphatemia --continue Auryxia  HTN --continue metop  GERD. Continue with Protonix.  Neuropathy.  --continue home dose of gabapentin.  Elevated troponin due to demand ischemia No chest pain, and trending down. Cardiology was consulted and they think that insignificant T wave changes were due to hypertensive urgency. Initially treated with heparin  infusion which was discontinued but cardiology. -Continue  aspirin, statin and beta-blocker.   Objective: Vitals:   04/16/20 0021 04/16/20 0557 04/16/20 0819 04/16/20 1141  BP: (!) 90/47 (!) 90/47 (!) 109/64 (!) 90/53  Pulse: 75 67 69 66  Resp: 16 16 15 18   Temp: 98.1 F (36.7 C) 98 F (36.7 C) 98.3 F (36.8 C) 98.5 F (36.9 C)  TempSrc: Oral Oral Oral Oral  SpO2: 98% 100% 99% 97%  Weight:      Height:        Intake/Output Summary (Last 24 hours) at 04/16/2020 1533 Last data filed at 04/16/2020 0412 Gross per 24 hour  Intake --  Output 0 ml  Net 0 ml   Filed Weights   04/05/20 1901 04/06/20 1752 04/07/20 0632  Weight: 71.7 kg 56.1 kg 68.9 kg    Examination:  Constitutional: NAD, AAOx3 HEENT: conjunctivae and lids normal, EOMI CV: RRR 2+ systolic murmur. Distal pulses +2.  No cyanosis.   RESP: CTA B/L, normal respiratory effort  GI: +BS, NTND Extremities: No effusions, edema, or tenderness in BLE SKIN: warm, dry and intact Neuro: II - XII grossly intact.  Sensation intact Psych: Depressed mood and affect.      DVT prophylaxis: Heparin Code Status: Full Family Communication:  Disposition Plan:  Status is: Inpatient  Remains inpatient appropriate because:Unsafe d/c plan   Dispo:  Patient From: Home  Planned Disposition: assisted living facility  Expected discharge date: 04/18/20, when facility will be able to accept pt   Medically stable for discharge: yes   Consultants:   Nephrology  Psychiatry  Cardiology  Procedures:  Antimicrobials:   Data Reviewed: I have personally reviewed following labs and imaging studies  CBC: Recent Labs  Lab 04/11/20 0432 04/12/20 0459 04/13/20 0528 04/15/20 0930 04/16/20 0431  WBC 6.1 6.5 5.4 4.3 6.1  HGB 9.4* 10.4* 9.2* 9.6* 9.6*  HCT 26.9* 31.0* 27.5* 27.9* 28.2*  MCV 94.1 98.4 98.6 95.5 98.3  PLT 143* 133* 153 218 235   Basic Metabolic Panel: Recent Labs  Lab 04/11/20 0432 04/11/20 0432 04/12/20 0459 04/12/20 1448 04/13/20 0528 04/15/20 0930  04/16/20 0431  NA 127*  129*  --  133*  --  130* 132* 135  K 5.6*  5.6*   < > 6.0* 6.0* 5.7* 4.6 4.2  CL 91*  94*  --  94*  --  91* 92* 96*  CO2 22  24  --  24  --  25 24 27   GLUCOSE 88  92  --  75  --  88 143* 88  BUN 87*  90*  --  55*  --  77* 71* 47*  CREATININE 9.53*  9.40*  --  7.23*  --  9.10* 8.24* 5.55*  CALCIUM 8.5*  8.5*  --  9.0  --  8.8* 9.2 8.8*  MG  --   --  1.7  --   --   --  1.5*  PHOS 5.9*  --   --   --   --  5.6*  --    < > = values in this interval not displayed.   GFR: Estimated Creatinine Clearance: 8.4 mL/min (A) (by C-G formula based on SCr of 5.55 mg/dL (H)). Liver Function Tests: Recent Labs  Lab 04/10/20 0352 04/11/20 0432 04/15/20 0930  AST 66*  --   --   ALT 29  --   --   ALKPHOS 53  --   --   BILITOT 1.1  --   --  PROT 6.2*  --   --   ALBUMIN 2.7* 2.4* 2.6*   No results for input(s): LIPASE, AMYLASE in the last 168 hours. No results for input(s): AMMONIA in the last 168 hours. Coagulation Profile: No results for input(s): INR, PROTIME in the last 168 hours. Cardiac Enzymes: No results for input(s): CKTOTAL, CKMB, CKMBINDEX, TROPONINI in the last 168 hours. BNP (last 3 results) No results for input(s): PROBNP in the last 8760 hours. HbA1C: No results for input(s): HGBA1C in the last 72 hours. CBG: Recent Labs  Lab 04/11/20 2054  GLUCAP 99   Lipid Profile: No results for input(s): CHOL, HDL, LDLCALC, TRIG, CHOLHDL, LDLDIRECT in the last 72 hours. Thyroid Function Tests: No results for input(s): TSH, T4TOTAL, FREET4, T3FREE, THYROIDAB in the last 72 hours. Anemia Panel: No results for input(s): VITAMINB12, FOLATE, FERRITIN, TIBC, IRON, RETICCTPCT in the last 72 hours. Sepsis Labs: No results for input(s): PROCALCITON, LATICACIDVEN in the last 168 hours.  No results found for this or any previous visit (from the past 240 hour(s)).   Radiology Studies: No results found.  Scheduled Meds: . aspirin EC  81 mg Oral Daily  .  Chlorhexidine Gluconate Cloth  6 each Topical Q0600  . dextromethorphan-guaiFENesin  1 tablet Oral BID  . divalproex  250 mg Oral BID  . docusate sodium  100 mg Oral BID  . DULoxetine  20 mg Oral Daily  . epoetin (EPOGEN/PROCRIT) injection  10,000 Units Intravenous Q M,W,F-HD  . ferric citrate  420 mg Oral TID WC  . gabapentin  100 mg Oral BID  . heparin injection (subcutaneous)  5,000 Units Subcutaneous Q8H  . metoprolol tartrate  12.5 mg Oral BID  . mupirocin ointment   Nasal BID  . OLANZapine  15 mg Oral QHS  . pantoprazole  40 mg Oral Daily  . sodium zirconium cyclosilicate  5 g Oral Daily  . thiamine  50 mg Oral Daily   Continuous Infusions:   LOS: 10 days    Enzo Bi, MD [facility]  If 7PM-7AM, please contact night-coverage [email or web address]  04/16/2020, 3:33 PM

## 2020-04-16 NOTE — Progress Notes (Signed)
[facility]  ROUNDING NOTE   Subjective:  Patient underwent hemodialysis yesterday. Tolerated well. Resting comfortably in bed at the moment.   Objective:  Vital signs in last 24 hours:  Temp:  [98 F (36.7 C)-98.9 F (37.2 C)] 98.5 F (36.9 C) (07/24 1141) Pulse Rate:  [66-78] 66 (07/24 1141) Resp:  [15-18] 18 (07/24 1141) BP: (90-111)/(47-64) 90/53 (07/24 1141) SpO2:  [97 %-100 %] 97 % (07/24 1141)  Weight change:  Filed Weights   04/05/20 1901 04/06/20 1752 04/07/20 0632  Weight: 71.7 kg 56.1 kg 68.9 kg    Intake/Output: I/O last 3 completed shifts: In: 120 [P.O.:120] Out: 1500 [Other:1500]   Intake/Output this shift:  No intake/output data recorded.  Physical Exam: General: NAD  Head: Normocephalic, atraumatic. Moist oral mucosal membranes  Eyes: Anicteric  Neck: Supple, trachea midline  Lungs:  Clear to auscultation, normal effort  Heart: Regular rate and rhythm  Abdomen:  Soft, nontender, bowel sounds present  Extremities: No peripheral edema.  Neurologic: Awake, alert, conversant  Skin: No rashes  Access: Left  Upper extremity AVF     Basic Metabolic Panel: Recent Labs  Lab 04/11/20 0432 04/11/20 0432 04/12/20 0459 04/12/20 0459 04/12/20 1448 04/13/20 0528 04/15/20 0930 04/16/20 0431  NA 127*  129*  --  133*  --   --  130* 132* 135  K 5.6*  5.6*   < > 6.0*  --  6.0* 5.7* 4.6 4.2  CL 91*  94*  --  94*  --   --  91* 92* 96*  CO2 22  24  --  24  --   --  25 24 27   GLUCOSE 88  92  --  75  --   --  88 143* 88  BUN 87*  90*  --  55*  --   --  77* 71* 47*  CREATININE 9.53*  9.40*  --  7.23*  --   --  9.10* 8.24* 5.55*  CALCIUM 8.5*  8.5*   < > 9.0   < >  --  8.8* 9.2 8.8*  MG  --   --  1.7  --   --   --   --  1.5*  PHOS 5.9*  --   --   --   --   --  5.6*  --    < > = values in this interval not displayed.    Liver Function Tests: Recent Labs  Lab 04/10/20 0352 04/11/20 0432 04/15/20 0930  AST 66*  --   --   ALT 29   --   --   ALKPHOS 53  --   --   BILITOT 1.1  --   --   PROT 6.2*  --   --   ALBUMIN 2.7* 2.4* 2.6*   No results for input(s): LIPASE, AMYLASE in the last 168 hours. No results for input(s): AMMONIA in the last 168 hours.  CBC: Recent Labs  Lab 04/11/20 0432 04/12/20 0459 04/13/20 0528 04/15/20 0930 04/16/20 0431  WBC 6.1 6.5 5.4 4.3 6.1  HGB 9.4* 10.4* 9.2* 9.6* 9.6*  HCT 26.9* 31.0* 27.5* 27.9* 28.2*  MCV 94.1 98.4 98.6 95.5 98.3  PLT 143* 133* 153 218 217    Cardiac Enzymes: No results for input(s): CKTOTAL, CKMB, CKMBINDEX, TROPONINI in the last 168 hours.  BNP: Invalid input(s): POCBNP  CBG: Recent Labs  Lab 04/11/20 2054  GLUCAP 99    Microbiology: Results for orders placed or performed during  the hospital encounter of 04/05/20  Culture, blood (routine x 2)     Status: None   Collection Time: 04/05/20 11:08 PM   Specimen: BLOOD RIGHT FOREARM  Result Value Ref Range Status   Specimen Description BLOOD RIGHT FOREARM  Final   Special Requests   Final    BOTTLES DRAWN AEROBIC ONLY Blood Culture adequate volume   Culture   Final    NO GROWTH 5 DAYS Performed at [facility], [address] [place], [place] [address]    Report Status 04/11/2020 FINAL  Final  MRSA PCR Screening     Status: Abnormal   Collection Time: 04/05/20 11:15 PM   Specimen: Nasopharyngeal  Result Value Ref Range Status   MRSA by PCR POSITIVE (A) NEGATIVE Final    Comment:        The GeneXpert MRSA Assay (FDA approved for NASAL specimens only), is one component of a comprehensive MRSA colonization surveillance program. It is not intended to diagnose MRSA infection nor to guide or monitor treatment for MRSA infections. RESULT CALLED TO, READ BACK BY AND VERIFIED WITH: LILLY WALKER@[email or web address]  ON 04/07/20 BY HKP Performed at [facility], [address] [place], [place] [address]   SARS Coronavirus 2 by RT PCR (hospital order, performed in [facility] hospital lab)  Nasopharyngeal Nasopharyngeal Swab     Status: None   Collection Time: 04/05/20 11:31 PM   Specimen: Nasopharyngeal Swab  Result Value Ref Range Status   SARS Coronavirus 2 NEGATIVE NEGATIVE Final    Comment: (NOTE) SARS-CoV-2 target nucleic acids are NOT DETECTED.  The SARS-CoV-2 RNA is generally detectable in upper and lower respiratory specimens during the acute phase of infection. The lowest concentration of SARS-CoV-2 viral copies this assay can detect is 250 copies / mL. A negative result does not preclude SARS-CoV-2 infection and should not be used as the sole basis for treatment or other patient management decisions.  A negative result may occur with improper specimen collection / handling, submission of specimen other than nasopharyngeal swab, presence of viral mutation(s) within the areas targeted by this assay, and inadequate number of viral copies (<250 copies / mL). A negative result must be combined with clinical observations, patient history, and epidemiological information.  Fact Sheet for Patients:   StrictlyIdeas.no  Fact Sheet for Healthcare Providers: BankingDealers.co.za  This test is not yet approved or  cleared by the [place] FDA and has been authorized for detection and/or diagnosis of SARS-CoV-2 by FDA under an Emergency Use Authorization (EUA).  This EUA will remain in effect (meaning this test can be used) for the duration of the COVID-19 declaration under Section 564(b)(1) of the Act, 21 U.S.C. section 360bbb-3(b)(1), unless the authorization is terminated or revoked sooner.  Performed at [facility], [address] [place], [place] [address]   Culture, blood (routine x 2)     Status: None   Collection Time: 04/06/20 12:27 AM   Specimen: BLOOD RIGHT HAND  Result Value Ref Range Status   Specimen Description BLOOD RIGHT HAND  Final   Special Requests   Final    BOTTLES DRAWN AEROBIC  ONLY Blood Culture adequate volume   Culture   Final    NO GROWTH 5 DAYS Performed at [facility], [address], [place], [place] [address]    Report Status 04/11/2020 FINAL  Final    Coagulation Studies: No results for input(s): LABPROT, INR in the last 72 hours.  Urinalysis: No results for input(s):  COLORURINE, LABSPEC, [place], GLUCOSEU, HGBUR, BILIRUBINUR, KETONESUR, PROTEINUR, UROBILINOGEN, NITRITE, LEUKOCYTESUR in the last 72 hours.  Invalid input(s): APPERANCEUR    Imaging: No results found.   Medications:    . aspirin EC  81 mg Oral Daily  . Chlorhexidine Gluconate Cloth  6 each Topical Q0600  . dextromethorphan-guaiFENesin  1 tablet Oral BID  . divalproex  250 mg Oral BID  . docusate sodium  100 mg Oral BID  . DULoxetine  20 mg Oral Daily  . epoetin (EPOGEN/PROCRIT) injection  10,000 Units Intravenous Q M,W,F-HD  . ferric citrate  420 mg Oral TID WC  . gabapentin  100 mg Oral BID  . heparin injection (subcutaneous)  5,000 Units Subcutaneous Q8H  . metoprolol tartrate  12.5 mg Oral BID  . mupirocin ointment   Nasal BID  . OLANZapine  15 mg Oral QHS  . pantoprazole  40 mg Oral Daily  . sodium zirconium cyclosilicate  5 g Oral Daily  . thiamine  50 mg Oral Daily   acetaminophen, ALPRAZolam, bisacodyl, nitroGLYCERIN, ondansetron (ZOFRAN) IV  Assessment/ Plan:  Madison [name] is a 69 y.o. black female  with end stage renal disease on hemodialysis, schizophrenia, hypertension, who was admitted to [facility] on 04/05/2020 for NSTEMI (non-ST elevated myocardial infarction) ([place]) [I21.4] Non-traumatic rhabdomyolysis [M62.82] Altered mental status, unspecified altered mental status type [B74.93] Acute metabolic encephalopathy [X52.17] Sepsis, due to unspecified organism, unspecified whether acute organ dysfunction present ([place]) [A41.9]   [facility] (Dr. Leontine Locket) 55.5kg Left AVF  1. ESRD on HD with hyperkalemia: Patient  underwent dialysis yesterday.  No acute indication for dialysis today.  We will plan for dialysis again on Monday.  2. Anemia of Chronic kidney disease:   Lab Results  Component Value Date   HGB 9.6 (L) 04/16/2020   -Maintain the patient on Epogen with dialysis treatments..   3. Secondary hyperparathyroidism with hyperphosphatemia.  Lab Results  Component Value Date   CALCIUM 8.8 (L) 04/16/2020   PHOS 5.6 (H) 04/15/2020  Maintain the patient on Auryxia.   4. Hypertension: Continue metoprolol.     LOS: 10 Kylen Ismael 7/24/20211:57 PM

## 2020-04-17 DIAGNOSIS — I214 Non-ST elevation (NSTEMI) myocardial infarction: Secondary | ICD-10-CM | POA: Diagnosis not present

## 2020-04-17 DIAGNOSIS — J189 Pneumonia, unspecified organism: Secondary | ICD-10-CM | POA: Diagnosis not present

## 2020-04-17 DIAGNOSIS — R778 Other specified abnormalities of plasma proteins: Secondary | ICD-10-CM | POA: Diagnosis not present

## 2020-04-17 LAB — BASIC METABOLIC PANEL
Anion gap: 15 (ref 5–15)
BUN: 66 mg/dL — ABNORMAL HIGH (ref 8–23)
CO2: 24 mmol/L (ref 22–32)
Calcium: 9.3 mg/dL (ref 8.9–10.3)
Chloride: 96 mmol/L — ABNORMAL LOW (ref 98–111)
Creatinine, Ser: 7.69 mg/dL — ABNORMAL HIGH (ref 0.44–1.00)
GFR calc Af Amer: 6 mL/min — ABNORMAL LOW (ref >60)
GFR calc non Af Amer: 5 mL/min — ABNORMAL LOW (ref >60)
Glucose, Bld: 86 mg/dL (ref 70–99)
Potassium: 4.4 mmol/L (ref 3.5–5.1)
Sodium: 135 mmol/L (ref 135–145)

## 2020-04-17 LAB — CBC
HCT: 28 % — ABNORMAL LOW (ref 36.0–46.0)
Hemoglobin: 9.5 g/dL — ABNORMAL LOW (ref 12.0–15.0)
MCH: 33.2 pg (ref 26.0–34.0)
MCHC: 33.9 g/dL (ref 30.0–36.0)
MCV: 97.9 fL (ref 80.0–100.0)
Platelets: 237 10*3/uL (ref 150–400)
RBC: 2.86 MIL/uL — ABNORMAL LOW (ref 3.87–5.11)
RDW: 16.7 % — ABNORMAL HIGH (ref 11.5–15.5)
WBC: 5.3 10*3/uL (ref 4.0–10.5)
nRBC: 0 % (ref 0.0–0.2)

## 2020-04-17 LAB — MAGNESIUM: Magnesium: 1.9 mg/dL (ref 1.7–2.4)

## 2020-04-17 LAB — SARS CORONAVIRUS 2 BY RT PCR (HOSPITAL ORDER, PERFORMED IN ~~LOC~~ HOSPITAL LAB): SARS Coronavirus 2: NEGATIVE

## 2020-04-17 NOTE — Progress Notes (Signed)
Physical Therapy Treatment Patient Details Name: Madison [name] MRN: [id number] DOB: [birth year] Today's Date: 04/17/2020    History of Present Illness Madison [name] is a 69 y.o. female with medical history significant for schizophrenia and bipolar mood disorder, ESRD on HD MWF last dialysis 04/04/2020, with no missed dialysis sessions who was brought in after she was found lying on the ground at the neighbors house for an unknown period of time.  Most of the history is given by the sister who states that she was sleeping and that her sister would not have been outside for more than a few hours.    PT Comments    Asleep on attempt this am, awake this pm.  Voiced frustration over discharge plan and that her younger sister is "controlling" her discharge.  Encouragement given. She able to get up with supervision and rails.  Steady in sitting.  Is given RW but she puts it aside stating she does not need it.  She is able to walk 150' with HHA +1 (does better with L hand support and guarding than R).  Gait with short shuffling steps with cues to increase step length for improved gait.  She remains in recliner after session.  Unsafe to walk without +1 assist with or without walker.   Follow Up Recommendations  Supervision for mobility/OOB;Home health PT     Equipment Recommendations  None recommended by PT    Recommendations for Other Services       Precautions / Restrictions Precautions Precautions: Fall    Mobility  Bed Mobility Overal bed mobility: Modified Independent Bed Mobility: Supine to Sit     Supine to sit: Supervision;Modified independent (Device/Increase time)        Transfers Overall transfer level: Modified independent Equipment used: None Transfers: Sit to/from Stand           General transfer comment: patient stood from bed without assist. Steady with static standing.  Ambulation/Gait Ambulation/Gait assistance: Min assist Gait Distance (Feet): 150  Feet Assistive device: 1 person hand held assist Gait Pattern/deviations: Step-to pattern;Shuffle;Decreased step length - right;Decreased step length - left Gait velocity: decr       Stairs             Wheelchair Mobility    Modified Rankin (Stroke Patients Only)       Balance Overall balance assessment: Mild deficits observed, not formally tested Sitting-balance support: Feet supported Sitting balance-Leahy Scale: Good     Standing balance support: No upper extremity supported Standing balance-Leahy Scale: Poor Standing balance comment: static standing good, ambulated with B UE support. No lob with mobility.                            Cognition Arousal/Alertness: Awake/alert Behavior During Therapy: Flat affect Overall Cognitive Status: No family/caregiver present to determine baseline cognitive functioning                                 General Comments: voices being upset over ALF plan and her younger sister "controlling" it.      Exercises      General Comments        Pertinent Vitals/Pain Pain Assessment: No/denies pain    Home Living                      Prior Function  PT Goals (current goals can now be found in the care plan section) Progress towards PT goals: Progressing toward goals    Frequency    Min 2X/week      PT Plan Current plan remains appropriate    Co-evaluation              AM-PAC PT "6 Clicks" Mobility   Outcome Measure  Help needed turning from your back to your side while in a flat bed without using bedrails?: None Help needed moving from lying on your back to sitting on the side of a flat bed without using bedrails?: None Help needed moving to and from a bed to a chair (including a wheelchair)?: A Little Help needed standing up from a chair using your arms (e.g., wheelchair or bedside chair)?: A Little Help needed to walk in hospital room?: A Little Help needed  climbing 3-5 steps with a railing? : A Lot 6 Click Score: 19    End of Session Equipment Utilized During Treatment: Gait belt Activity Tolerance: Patient tolerated treatment well Patient left: in chair;with call bell/phone within reach;with chair alarm set Nurse Communication: Mobility status       Time: 1447-1500 PT Time Calculation (min) (ACUTE ONLY): 13 min  Charges:  $Gait Training: 8-22 mins                    Chesley Noon, PTA 04/17/20, 3:45 PM

## 2020-04-17 NOTE — Progress Notes (Signed)
PROGRESS NOTE    Madison [name]  HER:[id number] DOB: 05/07/1951 DOA: 04/05/2020 PCP: Vidal Schwalbe, MD   Brief Narrative:  Madison [name] a 69 y.o.femalewith medical history significant forschizoaffective disorder bipolar type, ESRD on HD MWF last dialysis 04/04/2020, with no missed dialysis sessions who was brought in after she was found lying on the ground at the neighbors house for an unknown period of time and with empty pill packs found by patient's sister but unknown if patient overdosed.  Patient later admitted to taking all the pills she could find because she was "tired of it all". Sister also reported recent increased confusion with the patient, but no recent illnesses, fevers/chills or complaints of other symptoms.  In the ED, patient awake but confused.  Normal vitals.  EKG normal sinus rhythm and no acute ST-T wave changes. Pertinent labs include WBC 10.7k, CK 1711, troponin 158>>173. Lactic acid 2.8 >> 2.9.  Normal depakote level.  CXR showed mid and lower lung zones opacities concerning for pneumonia including atypical viral infection vs atelectasis. Covid-19 PCR negative. She was fluid resuscitated and treated with broad spectrum antibiotics in the ED per sepsis protocol.  Admitted to hospitalist service.    Subjective: Pt said her family hadn't called her even though family was given pt's room number to call.  Pt mentioned wanting to go home today, and seemed confused about her disposition plan.     Assessment & Plan:   Principal Problem:   Acute metabolic encephalopathy Active Problems:   ESRD on hemodialysis (HCC)   Schizoaffective disorder, bipolar type (HCC)   Rhabdomyolysis   Elevated troponin   Pneumonia   Lactic acidosis   Suspected Sepsis ([place])   Possible NSTEMI (non-ST elevated myocardial infarction) ([place])    Suspected Overdose, undetermined intent, initial encounter   Acute metabolic encephalopathy / Decompensated Schizoaffective and Bipolar  Disorders. POA,  Patient was originally found with empty pill pockets. There was some concern of intentional overdose, psychiatry was consulted and later find out that she was having some attention seeking behavior due to difficulty with her sister. Patient has normal CT head, normal ammonia, B12 high, thiamine levels pending an MRI without any acute abnormalities. Psychiatry does not think that she qualify for inpatient behavioral health. PLAN: -TOC has found an assisted living facility for pt   Schizoaffective disorder, bipolar type - Depakote level within normal limits on admission.  Psychiatry is consulted given suicide attempt, was considered attention seeking. -Continue Zyprexa and Depakote. -continue Cymbalta (new by psychiatry)  Sepsis due to Community-acquired PNA, treated Lactic acidosis. Resolved. Initially meeting sepsis criteria with elevated lactic acid, mild leukocytosis. She was initially treated with IV Rocephin and Zithromax followed by Augmentin, completed the course. Cultures remain negative.   Anemia of chronic disease.  Iron panel consistent with anemia of chronic disease most likely secondary to renal. -Continue to monitor -Transfuse if below 7 --Epogen with dialysis  Nontraumatic rhabdomyolysis.  CK elevated on presentation. Unable to give much IV fluid as patient is on HD. It was trending down when last checked on 04/08/2020  ESRD on hemodialysis M/W/F  Hyperkalemia - had not missed any sessions except day of admission. --HD per nephrology --potassium removal with dialysis  Secondary hyperparathyroidism with hyperphosphatemia --continue Auryxia  HTN --continue metop  GERD. Continue with Protonix.  Neuropathy.  --continue home dose of gabapentin.  Elevated troponin due to demand ischemia No chest pain, and trending down. Cardiology was consulted and they think that insignificant T wave changes were  due to hypertensive urgency. Initially treated with  heparin infusion which was discontinued but cardiology. -Continue aspirin, statin and beta-blocker.   Objective: Vitals:   04/17/20 0024 04/17/20 0543 04/17/20 0741 04/17/20 1121  BP: (!) 93/49 (!) 95/49 (!) 105/49 (!) 106/57  Pulse: 64 62 64 64  Resp: 15 15 18 17   Temp: 97.9 F (36.6 C) 98 F (36.7 C) 97.7 F (36.5 C) 97.7 F (36.5 C)  TempSrc: Oral Oral Oral Oral  SpO2: 100% 99% 96% 100%  Weight:      Height:        Intake/Output Summary (Last 24 hours) at 04/17/2020 1324 Last data filed at 04/16/2020 1709 Gross per 24 hour  Intake 100 ml  Output --  Net 100 ml   Filed Weights   04/05/20 1901 04/06/20 1752 04/07/20 0632  Weight: 71.7 kg 56.1 kg 68.9 kg    Examination:  Constitutional: NAD, alert, seemed confused HEENT: conjunctivae and lids normal, EOMI CV: RRR no M,R,G. Distal pulses +2.  No cyanosis.   RESP: CTA B/L, normal respiratory effort  GI: +BS, NTND Extremities: No effusions, edema, or tenderness in BLE SKIN: warm, dry and intact Neuro: II - XII grossly intact.  Sensation intact Psych: Depressed mood and affect.      DVT prophylaxis: Heparin Code Status: Full Family Communication:  Disposition Plan:  Status is: Inpatient  Remains inpatient appropriate because:Unsafe d/c plan   Dispo:  Patient From: Home  Planned Disposition: assisted living facility  Expected discharge date: 04/18/20, when facility will be able to accept pt   Medically stable for discharge: yes   Consultants:   Nephrology  Psychiatry  Cardiology  Procedures:  Antimicrobials:   Data Reviewed: I have personally reviewed following labs and imaging studies  CBC: Recent Labs  Lab 04/12/20 0459 04/13/20 0528 04/15/20 0930 04/16/20 0431 04/17/20 0452  WBC 6.5 5.4 4.3 6.1 5.3  HGB 10.4* 9.2* 9.6* 9.6* 9.5*  HCT 31.0* 27.5* 27.9* 28.2* 28.0*  MCV 98.4 98.6 95.5 98.3 97.9  PLT 133* 153 218 217 147   Basic Metabolic Panel: Recent Labs  Lab 04/11/20 0432  04/11/20 0432 04/12/20 0459 04/12/20 0459 04/12/20 1448 04/13/20 0528 04/15/20 0930 04/16/20 0431 04/17/20 0452  NA 127*  129*   < > 133*  --   --  130* 132* 135 135  K 5.6*  5.6*   < > 6.0*   < > 6.0* 5.7* 4.6 4.2 4.4  CL 91*  94*   < > 94*  --   --  91* 92* 96* 96*  CO2 22  24   < > 24  --   --  25 24 27 24   GLUCOSE 88  92   < > 75  --   --  88 143* 88 86  BUN 87*  90*   < > 55*  --   --  77* 71* 47* 66*  CREATININE 9.53*  9.40*   < > 7.23*  --   --  9.10* 8.24* 5.55* 7.69*  CALCIUM 8.5*  8.5*   < > 9.0  --   --  8.8* 9.2 8.8* 9.3  MG  --   --  1.7  --   --   --   --  1.5* 1.9  PHOS 5.9*  --   --   --   --   --  5.6*  --   --    < > = values in this interval not displayed.  GFR: Estimated Creatinine Clearance: 6 mL/min (A) (by C-G formula based on SCr of 7.69 mg/dL (H)). Liver Function Tests: Recent Labs  Lab 04/11/20 0432 04/15/20 0930  ALBUMIN 2.4* 2.6*   No results for input(s): LIPASE, AMYLASE in the last 168 hours. No results for input(s): AMMONIA in the last 168 hours. Coagulation Profile: No results for input(s): INR, PROTIME in the last 168 hours. Cardiac Enzymes: No results for input(s): CKTOTAL, CKMB, CKMBINDEX, TROPONINI in the last 168 hours. BNP (last 3 results) No results for input(s): PROBNP in the last 8760 hours. HbA1C: No results for input(s): HGBA1C in the last 72 hours. CBG: Recent Labs  Lab 04/11/20 2054  GLUCAP 99   Lipid Profile: No results for input(s): CHOL, HDL, LDLCALC, TRIG, CHOLHDL, LDLDIRECT in the last 72 hours. Thyroid Function Tests: No results for input(s): TSH, T4TOTAL, FREET4, T3FREE, THYROIDAB in the last 72 hours. Anemia Panel: No results for input(s): VITAMINB12, FOLATE, FERRITIN, TIBC, IRON, RETICCTPCT in the last 72 hours. Sepsis Labs: No results for input(s): PROCALCITON, LATICACIDVEN in the last 168 hours.  No results found for this or any previous visit (from the past 240 hour(s)).   Radiology Studies: No  results found.  Scheduled Meds: . aspirin EC  81 mg Oral Daily  . Chlorhexidine Gluconate Cloth  6 each Topical Q0600  . dextromethorphan-guaiFENesin  1 tablet Oral BID  . divalproex  250 mg Oral BID  . docusate sodium  100 mg Oral BID  . DULoxetine  20 mg Oral Daily  . epoetin (EPOGEN/PROCRIT) injection  10,000 Units Intravenous Q M,W,F-HD  . ferric citrate  420 mg Oral TID WC  . gabapentin  100 mg Oral BID  . heparin injection (subcutaneous)  5,000 Units Subcutaneous Q8H  . metoprolol tartrate  12.5 mg Oral BID  . mupirocin ointment   Nasal BID  . OLANZapine  15 mg Oral QHS  . pantoprazole  40 mg Oral Daily  . sodium zirconium cyclosilicate  5 g Oral Daily  . thiamine  50 mg Oral Daily   Continuous Infusions:   LOS: 11 days    Enzo Bi, MD [facility]  If 7PM-7AM, please contact night-coverage [email or web address]  04/17/2020, 1:24 PM

## 2020-04-17 NOTE — Progress Notes (Signed)
[facility]  ROUNDING NOTE   Subjective:  Patient seen and evaluated bedside. Due for dialysis treatment again tomorrow.   Objective:  Vital signs in last 24 hours:  Temp:  [97.3 F (36.3 C)-98.6 F (37 C)] 97.3 F (36.3 C) (07/25 1608) Pulse Rate:  [62-79] 62 (07/25 1608) Resp:  [15-18] 16 (07/25 1608) BP: (90-111)/(49-57) 111/56 (07/25 1608) SpO2:  [95 %-100 %] 98 % (07/25 1608)  Weight change:  Filed Weights   04/05/20 1901 04/06/20 1752 04/07/20 7673  Weight: 71.7 kg 56.1 kg 68.9 kg    Intake/Output: I/O last 3 completed shifts: In: 100 [IV Piggyback:100] Out: 0    Intake/Output this shift:  No intake/output data recorded.  Physical Exam: General: NAD  Head: Normocephalic, atraumatic. Moist oral mucosal membranes  Eyes: Anicteric  Neck: Supple, trachea midline  Lungs:  Clear to auscultation, normal effort  Heart: Regular rate and rhythm  Abdomen:  Soft, nontender, bowel sounds present  Extremities: No peripheral edema.  Neurologic: Awake, alert, conversant  Skin: No rashes  Access: Left  Upper extremity AVF     Basic Metabolic Panel: Recent Labs  Lab 04/11/20 0432 04/11/20 0432 04/12/20 0459 04/12/20 0459 04/12/20 1448 04/13/20 0528 04/13/20 0528 04/15/20 0930 04/16/20 0431 04/17/20 0452  NA 127*  129*   < > 133*  --   --  130*  --  132* 135 135  K 5.6*  5.6*   < > 6.0*   < > 6.0* 5.7*  --  4.6 4.2 4.4  CL 91*  94*   < > 94*  --   --  91*  --  92* 96* 96*  CO2 22  24   < > 24  --   --  25  --  24 27 24   GLUCOSE 88  92   < > 75  --   --  88  --  143* 88 86  BUN 87*  90*   < > 55*  --   --  77*  --  71* 47* 66*  CREATININE 9.53*  9.40*   < > 7.23*  --   --  9.10*  --  8.24* 5.55* 7.69*  CALCIUM 8.5*  8.5*   < > 9.0   < >  --  8.8*   < > 9.2 8.8* 9.3  MG  --   --  1.7  --   --   --   --   --  1.5* 1.9  PHOS 5.9*  --   --   --   --   --   --  5.6*  --   --    < > = values in this interval not displayed.    Liver Function  Tests: Recent Labs  Lab 04/11/20 0432 04/15/20 0930  ALBUMIN 2.4* 2.6*   No results for input(s): LIPASE, AMYLASE in the last 168 hours. No results for input(s): AMMONIA in the last 168 hours.  CBC: Recent Labs  Lab 04/12/20 0459 04/13/20 0528 04/15/20 0930 04/16/20 0431 04/17/20 0452  WBC 6.5 5.4 4.3 6.1 5.3  HGB 10.4* 9.2* 9.6* 9.6* 9.5*  HCT 31.0* 27.5* 27.9* 28.2* 28.0*  MCV 98.4 98.6 95.5 98.3 97.9  PLT 133* 153 218 217 237    Cardiac Enzymes: No results for input(s): CKTOTAL, CKMB, CKMBINDEX, TROPONINI in the last 168 hours.  BNP: Invalid input(s): POCBNP  CBG: Recent Labs  Lab 04/11/20 2054  GLUCAP 99    Microbiology: Results for  orders placed or performed during the hospital encounter of 04/05/20  Culture, blood (routine x 2)     Status: None   Collection Time: 04/05/20 11:08 PM   Specimen: BLOOD RIGHT FOREARM  Result Value Ref Range Status   Specimen Description BLOOD RIGHT FOREARM  Final   Special Requests   Final    BOTTLES DRAWN AEROBIC ONLY Blood Culture adequate volume   Culture   Final    NO GROWTH 5 DAYS Performed at [facility], [address] [place], [place] [address]    Report Status 04/11/2020 FINAL  Final  MRSA PCR Screening     Status: Abnormal   Collection Time: 04/05/20 11:15 PM   Specimen: Nasopharyngeal  Result Value Ref Range Status   MRSA by PCR POSITIVE (A) NEGATIVE Final    Comment:        The GeneXpert MRSA Assay (FDA approved for NASAL specimens only), is one component of a comprehensive MRSA colonization surveillance program. It is not intended to diagnose MRSA infection nor to guide or monitor treatment for MRSA infections. RESULT CALLED TO, READ BACK BY AND VERIFIED WITH: LILLY WALKER@[email or web address]  ON 04/07/20 BY HKP Performed at [facility], [address] [place], [place] [address]   SARS Coronavirus 2 by RT PCR (hospital order, performed in [facility] hospital lab) Nasopharyngeal  Nasopharyngeal Swab     Status: None   Collection Time: 04/05/20 11:31 PM   Specimen: Nasopharyngeal Swab  Result Value Ref Range Status   SARS Coronavirus 2 NEGATIVE NEGATIVE Final    Comment: (NOTE) SARS-CoV-2 target nucleic acids are NOT DETECTED.  The SARS-CoV-2 RNA is generally detectable in upper and lower respiratory specimens during the acute phase of infection. The lowest concentration of SARS-CoV-2 viral copies this assay can detect is 250 copies / mL. A negative result does not preclude SARS-CoV-2 infection and should not be used as the sole basis for treatment or other patient management decisions.  A negative result may occur with improper specimen collection / handling, submission of specimen other than nasopharyngeal swab, presence of viral mutation(s) within the areas targeted by this assay, and inadequate number of viral copies (<250 copies / mL). A negative result must be combined with clinical observations, patient history, and epidemiological information.  Fact Sheet for Patients:   StrictlyIdeas.no  Fact Sheet for Healthcare Providers: BankingDealers.co.za  This test is not yet approved or  cleared by the [place] FDA and has been authorized for detection and/or diagnosis of SARS-CoV-2 by FDA under an Emergency Use Authorization (EUA).  This EUA will remain in effect (meaning this test can be used) for the duration of the COVID-19 declaration under Section 564(b)(1) of the Act, 21 U.S.C. section 360bbb-3(b)(1), unless the authorization is terminated or revoked sooner.  Performed at [facility], [address] [place], [place] [address]   Culture, blood (routine x 2)     Status: None   Collection Time: 04/06/20 12:27 AM   Specimen: BLOOD RIGHT HAND  Result Value Ref Range Status   Specimen Description BLOOD RIGHT HAND  Final   Special Requests   Final    BOTTLES DRAWN AEROBIC ONLY Blood  Culture adequate volume   Culture   Final    NO GROWTH 5 DAYS Performed at [facility], [address] [place],  [address]    Report Status 04/11/2020 FINAL  Final  SARS Coronavirus 2 by RT PCR (hospital order, performed in [facility] hospital lab) Nasopharyngeal Nasopharyngeal  Swab     Status: None   Collection Time: 04/17/20  2:36 PM   Specimen: Nasopharyngeal Swab  Result Value Ref Range Status   SARS Coronavirus 2 NEGATIVE NEGATIVE Final    Comment: (NOTE) SARS-CoV-2 target nucleic acids are NOT DETECTED.  The SARS-CoV-2 RNA is generally detectable in upper and lower respiratory specimens during the acute phase of infection. The lowest concentration of SARS-CoV-2 viral copies this assay can detect is 250 copies / mL. A negative result does not preclude SARS-CoV-2 infection and should not be used as the sole basis for treatment or other patient management decisions.  A negative result may occur with improper specimen collection / handling, submission of specimen other than nasopharyngeal swab, presence of viral mutation(s) within the areas targeted by this assay, and inadequate number of viral copies (<250 copies / mL). A negative result must be combined with clinical observations, patient history, and epidemiological information.  Fact Sheet for Patients:   StrictlyIdeas.no  Fact Sheet for Healthcare Providers: BankingDealers.co.za  This test is not yet approved or  cleared by the [place] FDA and has been authorized for detection and/or diagnosis of SARS-CoV-2 by FDA under an Emergency Use Authorization (EUA).  This EUA will remain in effect (meaning this test can be used) for the duration of the COVID-19 declaration under Section 564(b)(1) of the Act, 21 U.S.C. section 360bbb-3(b)(1), unless the authorization is terminated or revoked sooner.  Performed at [facility], [address]  [place], [place] [address]     Coagulation Studies: No results for input(s): LABPROT, INR in the last 72 hours.  Urinalysis: No results for input(s): COLORURINE, LABSPEC, PHURINE, GLUCOSEU, HGBUR, BILIRUBINUR, KETONESUR, PROTEINUR, UROBILINOGEN, NITRITE, LEUKOCYTESUR in the last 72 hours.  Invalid input(s): APPERANCEUR    Imaging: No results found.   Medications:    . aspirin EC  81 mg Oral Daily  . Chlorhexidine Gluconate Cloth  6 each Topical Q0600  . dextromethorphan-guaiFENesin  1 tablet Oral BID  . divalproex  250 mg Oral BID  . docusate sodium  100 mg Oral BID  . DULoxetine  20 mg Oral Daily  . epoetin (EPOGEN/PROCRIT) injection  10,000 Units Intravenous Q M,W,F-HD  . ferric citrate  420 mg Oral TID WC  . gabapentin  100 mg Oral BID  . heparin injection (subcutaneous)  5,000 Units Subcutaneous Q8H  . metoprolol tartrate  12.5 mg Oral BID  . mupirocin ointment   Nasal BID  . OLANZapine  15 mg Oral QHS  . pantoprazole  40 mg Oral Daily  . sodium zirconium cyclosilicate  5 g Oral Daily  . thiamine  50 mg Oral Daily   acetaminophen, ALPRAZolam, bisacodyl, nitroGLYCERIN, ondansetron (ZOFRAN) IV  Assessment/ Plan:  Madison [name] is a 69 y.o. black female  with end stage renal disease on hemodialysis, schizophrenia, hypertension, who was admitted to [facility] on 04/05/2020 for NSTEMI (non-ST elevated myocardial infarction) ([place]) [I21.4] Non-traumatic rhabdomyolysis [M62.82] Altered mental status, unspecified altered mental status type [H29.92] Acute metabolic encephalopathy [E26.83] Sepsis, due to unspecified organism, unspecified whether acute organ dysfunction present ([place]) [A41.9]   [facility] (Dr. Leontine Locket) 55.5kg Left AVF  1. ESRD on HD with hyperkalemia: No acute indication for dialysis treatment today.  We will plan for hemodialysis again tomorrow.  2. Anemia of Chronic kidney disease:   Lab Results  Component Value Date   HGB 9.5 (L)  04/17/2020   -Administer Epogen 10,000 IV with dialysis tomorrow.  3.  Secondary hyperparathyroidism with hyperphosphatemia.  Lab Results  Component Value Date   CALCIUM 9.3 04/17/2020   PHOS 5.6 (H) 04/15/2020  Maintain the patient on Auryxia.  Continue to periodically monitor bone mineral metabolism parameters.  4. Hypertension: Blood pressure under good control at 111/56.  Continue metoprolol.     LOS: 11 Betsey Sossamon 7/25/20214:16 PM

## 2020-04-18 DIAGNOSIS — R778 Other specified abnormalities of plasma proteins: Secondary | ICD-10-CM | POA: Diagnosis not present

## 2020-04-18 DIAGNOSIS — J189 Pneumonia, unspecified organism: Secondary | ICD-10-CM | POA: Diagnosis not present

## 2020-04-18 DIAGNOSIS — I214 Non-ST elevation (NSTEMI) myocardial infarction: Secondary | ICD-10-CM | POA: Diagnosis not present

## 2020-04-18 LAB — BASIC METABOLIC PANEL
Anion gap: 14 (ref 5–15)
BUN: 80 mg/dL — ABNORMAL HIGH (ref 8–23)
CO2: 24 mmol/L (ref 22–32)
Calcium: 9 mg/dL (ref 8.9–10.3)
Chloride: 96 mmol/L — ABNORMAL LOW (ref 98–111)
Creatinine, Ser: 9.24 mg/dL — ABNORMAL HIGH (ref 0.44–1.00)
GFR calc Af Amer: 4 mL/min — ABNORMAL LOW (ref >60)
GFR calc non Af Amer: 4 mL/min — ABNORMAL LOW (ref >60)
Glucose, Bld: 82 mg/dL (ref 70–99)
Potassium: 4.9 mmol/L (ref 3.5–5.1)
Sodium: 134 mmol/L — ABNORMAL LOW (ref 135–145)

## 2020-04-18 LAB — CBC
HCT: 28.3 % — ABNORMAL LOW (ref 36.0–46.0)
Hemoglobin: 9.2 g/dL — ABNORMAL LOW (ref 12.0–15.0)
MCH: 32.5 pg (ref 26.0–34.0)
MCHC: 32.5 g/dL (ref 30.0–36.0)
MCV: 100 fL (ref 80.0–100.0)
Platelets: 254 10*3/uL (ref 150–400)
RBC: 2.83 MIL/uL — ABNORMAL LOW (ref 3.87–5.11)
RDW: 16.5 % — ABNORMAL HIGH (ref 11.5–15.5)
WBC: 4.3 10*3/uL (ref 4.0–10.5)
nRBC: 0 % (ref 0.0–0.2)

## 2020-04-18 LAB — MAGNESIUM: Magnesium: 1.9 mg/dL (ref 1.7–2.4)

## 2020-04-18 MED ORDER — METOPROLOL TARTRATE 25 MG PO TABS: ORAL_TABLET | ORAL | Status: DC

## 2020-04-18 MED ORDER — DULOXETINE HCL 20 MG PO CPEP: 20.0000 mg | ORAL_CAPSULE | Freq: Every day | ORAL | 3 refills | Status: DC

## 2020-04-18 MED ORDER — DOCUSATE SODIUM 100 MG PO CAPS: 100.0000 mg | ORAL_CAPSULE | Freq: Two times a day (BID) | ORAL | 0 refills | Status: DC

## 2020-04-18 MED ORDER — DIVALPROEX SODIUM 250 MG PO DR TAB: 250.0000 mg | DELAYED_RELEASE_TABLET | Freq: Two times a day (BID) | ORAL | Status: DC

## 2020-04-18 MED ORDER — ALPRAZOLAM 0.25 MG PO TABS: 0.2500 mg | ORAL_TABLET | Freq: Three times a day (TID) | ORAL | 0 refills | Status: DC | PRN

## 2020-04-18 MED ORDER — SODIUM ZIRCONIUM CYCLOSILICATE 5 G PO PACK: 5.0000 g | PACK | Freq: Every day | ORAL | Status: DC

## 2020-04-18 MED ORDER — ASPIRIN 81 MG PO TBEC: 81.0000 mg | DELAYED_RELEASE_TABLET | Freq: Every day | ORAL | 11 refills | Status: DC

## 2020-04-18 NOTE — Progress Notes (Signed)
Patients clothes sent with EMS and patient to [facility]

## 2020-04-18 NOTE — TOC Progression Note (Signed)
Transition of Care [facility]) - Progression Note    Patient Details  Name: Madison [name] MRN: [id number] Date of Birth: 02-Dec-1950  Transition of Care [facility]) CM/SW Contact  Shelbie Hutching, RN Phone Number: 04/18/2020, 1:45 PM  Clinical Narrative:    [facility] will pick patient up at between 3 and 3:30 this afternoon and transport to [facility].     Expected Discharge Plan: Group Home Barriers to Discharge: Barriers Resolved  Expected Discharge Plan and Services Expected Discharge Plan: Group Home   Discharge Planning Services: CM Consult   Living arrangements for the past 2 months: Single Family Home Expected Discharge Date: 04/18/20                                     Social Determinants of Health (SDOH) Interventions    Readmission Risk Interventions No flowsheet data found.

## 2020-04-18 NOTE — TOC Transition Note (Signed)
Transition of Care [facility]) - CM/SW Discharge Note   Patient Details  Name: Madison [name] MRN: [id number] Date of Birth: 21-Apr-1951  Transition of Care [facility]) CM/SW Contact:  Shelbie Hutching, RN Phone Number: 04/18/2020, 1:11 PM   Clinical Narrative:    Patient is medically cleared for discharge to [facility] in [place] today.  Patient is currently in dialysis, once she returns to the floor this RNCM will arrange EMS transport.  This RNCM has notified the patient's sister, [name], on the discharge for today.     Final next level of care: Group Home Barriers to Discharge: Barriers Resolved   Patient Goals and CMS Choice Patient states their goals for this hospitalization and ongoing recovery are:: Patient wants to go back home CMS Medicare.gov Compare Post Acute Care list provided to:: Patient Choice offered to / list presented to : Patient  Discharge Placement              Patient chooses bed at:  [facility]) Patient to be transferred to facility by: EMS Name of family member notified: [name] Patient and family notified of of transfer: 04/18/20  Discharge Plan and Services   Discharge Planning Services: CM Consult                                 Social Determinants of Health (SDOH) Interventions     Readmission Risk Interventions No flowsheet data found.

## 2020-04-18 NOTE — Discharge Summary (Signed)
Physician Discharge Summary   Madison [name]  female DOB: Feb 06, 1951  XAJ:[id number]  PCP: Vidal Schwalbe, MD  Admit date: 04/05/2020 Discharge date: 04/18/2020  Admitted From: home Disposition:  assisted living at [facility]. Home Health: Yes CODE STATUS: Full code   Hospital Course:  For full details, please see H&P, progress notes, consult notes and ancillary notes.  Briefly,  Madison [name] a 69 y.o.AA femalewith medical history significant forschizoaffective disorder bipolar type, ESRD on HD MWF who was brought in after she was found lying on the ground at the neighbor's house for an unknown period of time and with empty pill packs found by patient's sister but unknown if patient overdosed. Patient later admitted to taking all the pills she could find because she was "tired of it all". Sister also reported recent increased confusion with the patient, but no recent illnesses, fevers/chills or complaints of other symptoms.  In the ED, patient awake but confused. Normal vitals. EKG normal sinus rhythm and no acute ST-T wave changes. Pertinent labs include WBC 10.7k, CK 1711, troponin 158>>173. Lactic acid 2.8 >>2.9. Normal depakote level. CXR showed mid and lower lung zones opacities concerning for pneumonia including atypical viral infection vs atelectasis. Covid-19 PCR negative. She was fluid resuscitated and treated with broad spectrum antibiotics in the ED per sepsis protocol. Admitted to hospitalist service.   Acute metabolic encephalopathy, POA Decompensated Schizoaffective and Bipolar Disorders Patient was originally found with empty pill pockets. There was some concern of intentional overdose, psychiatry was consulted and later found out that she was having some attention seeking behavior due to difficulty with her sister.  Patient had normal CT head, normal ammonia, B12 high.  MRI without any acute abnormalities.  Psychiatry did not think that she needed  admission for inpatient behavioral health.  Family did not want pt to return home, so pt was discharged to assisted living at [facility].  Schizoaffective disorder, bipolar type Depakote level within normal limits on admission. Psychiatry was consulted given suicide attempt, was considered attention seeking.  Pt was continued on Zyprexa and Depakote and PRN Xanax.  Pt was prescribed Cymbalta (new by psychiatry).  Community-acquired PNA, treated Sepsis ruled out Lactic acidosis. Resolved. On presentation, pt had elevated lactic acid, mild leukocytosis, however, did not meet sepsis criteria.  CXR showed mid and lower lung zones opacities concerning for pneumonia, unknown whether bacterial or viral.  Pt was initially treated with vanc/cefepime, then IV Rocephin and Zithromax, followed by Augmentin, completed a 6-day course for empiric treatment. Cultures remained negative.   Anemia of chronic disease.  Iron panel consistent with anemia of chronic disease most likely secondary to ESRD.  Hgb stable in 9's.  Epogen with dialysis.  Nontraumatic rhabdomyolysis.  CK elevated on presentation 1711. Unable to give much IV fluid as patient is on HD. It was trending down to 1169 when last checked on 04/08/2020.  ESRD on hemodialysis M/W/F Hyperkalemia Pt had not missed any sessions except day of admission.  Pt received iHD per nephrology.  Continued Lokelma 5 mg daily.  Secondary hyperparathyroidism with hyperphosphatemia continued Auryxia, per nephrology.  HTN Pt was started on low-dose metop during this hospitalization, however, had to be held half of the time due to already low HR and BP, so it was d/c'ed.  GERD.  Continued with Protonix.  Neuropathy.  continued home dose of gabapentin.  Elevated troponin due to demand ischemia No chest pain, and trending down. Cardiology was consulted and they think that insignificant  T wave changes were due to hypertensive urgency. Initially  treated with heparin infusion which was discontinued but cardiology.  Continued aspirin, statin.   Pt was started on low-dose metop during this hospitalization, however, had to be held half of the time due to already low HR and BP, so it was d/c'ed.    Discharge Diagnoses:  Principal Problem:   Acute metabolic encephalopathy Active Problems:   ESRD on hemodialysis (HCC)   Schizoaffective disorder, bipolar type (HCC)   Rhabdomyolysis   Elevated troponin   Pneumonia   Lactic acidosis   Suspected Sepsis ([place])   Possible NSTEMI (non-ST elevated myocardial infarction) ([place])    Suspected Overdose, undetermined intent, initial encounter    Discharge Instructions:  Allergies as of 04/18/2020   No Known Allergies     Medication List    STOP taking these medications   senna 8.6 MG Tabs tablet Commonly known as: SENOKOT     TAKE these medications   ALPRAZolam 0.25 MG tablet Commonly known as: XANAX Take 1 tablet (0.25 mg total) by mouth 3 (three) times daily as needed for anxiety.   aspirin 81 MG EC tablet Take 1 tablet (81 mg total) by mouth daily. Swallow whole.   Auryxia 1 GM 210 MG(Fe) tablet Generic drug: ferric citrate Take 210 mg by mouth 3 (three) times daily with meals.   DIALYVITE TABLET Tabs Take 1 tablet by mouth daily.   divalproex 250 MG DR tablet Commonly known as: DEPAKOTE Take 1 tablet (250 mg total) by mouth 2 (two) times daily.   docusate sodium 100 MG capsule Commonly known as: COLACE Take 1 capsule (100 mg total) by mouth 2 (two) times daily.   DULoxetine 20 MG capsule Commonly known as: CYMBALTA Take 1 capsule (20 mg total) by mouth daily.   epoetin alfa 10000 UNIT/ML injection Commonly known as: EPOGEN Inject 1 mL (10,000 Units total) into the vein every Monday, Wednesday, and Friday with hemodialysis.   gabapentin 100 MG capsule Commonly known as: NEURONTIN Take 1 capsule (100 mg total) by mouth 2 (two) times daily.   OLANZapine 15 MG  tablet Commonly known as: ZYPREXA Take 15 mg by mouth at bedtime.   pantoprazole 40 MG tablet Commonly known as: PROTONIX TAKE 1 TABLET BY MOUTH ONCE DAILY.   sodium zirconium cyclosilicate 5 g packet Commonly known as: LOKELMA Take 5 g by mouth daily. What changed:   medication strength  how much to take   thiamine 100 MG tablet Commonly known as: Vitamin B-1 Take 0.5 tablets (50 mg total) by mouth daily.        Follow-up Information    Vidal Schwalbe, MD. Schedule an appointment as soon as possible for a visit in 1 week(s).   Specialty: Family Medicine Contact information: [address] [place] [address] [phone]               No Known Allergies   The results of significant diagnostics from this hospitalization (including imaging, microbiology, ancillary and laboratory) are listed below for reference.   Consultations:   Procedures/Studies: DG Chest 1 View  Result Date: 04/05/2020 CLINICAL DATA:  Confusion. EXAM: CHEST  1 VIEW COMPARISON:  01/07/2019 FINDINGS: Low lung volumes. Upper normal heart size with mild aortic tortuosity. Vague heterogeneous opacities in both mid lower lung zones. No pneumothorax or significant pleural effusion. Vascular stent in the left upper arm. Bones are under mineralized. IMPRESSION: Low lung volumes with vague heterogeneous opacities in both mid and lower  lung zones, atelectasis versus pneumonia, including atypical viral infection. Electronically Signed   By: Keith Rake M.D.   On: 04/05/2020 23:28   CT Head Wo Contrast  Result Date: 04/06/2020 CLINICAL DATA:  69 year old female with altered mental status. EXAM: CT HEAD WITHOUT CONTRAST TECHNIQUE: Contiguous axial images were obtained from the base of the skull through the vertex without intravenous contrast. COMPARISON:  Head CT dated 01/05/2019. FINDINGS: Brain: Mild age-related atrophy and chronic microvascular ischemic changes. A 2.0 x 1.8 cm right anterior  parafalcine partially calcified meningioma similar to prior CT. No surrounding edema. There is no acute intracranial hemorrhage. No mass effect or midline shift. No extra-axial fluid collection. Vascular: No hyperdense vessel or unexpected calcification. Skull: Normal. Negative for fracture or focal lesion. Sinuses/Orbits: No acute finding. Other: None IMPRESSION: 1. No acute intracranial pathology. 2. Mild age-related atrophy and chronic microvascular ischemic changes. 3. Stable right anterior parafalcine meningioma. No associated edema. Electronically Signed   By: Anner Crete M.D.   On: 04/06/2020 01:29   MR BRAIN WO CONTRAST  Result Date: 04/09/2020 CLINICAL DATA:  Encephalopathy.  ESRD. EXAM: MRI HEAD WITHOUT CONTRAST TECHNIQUE: Multiplanar, multiecho pulse sequences of the brain and surrounding structures were obtained without intravenous contrast. COMPARISON:  CT head 04/06/2020, 01/20/2019 FINDINGS: Brain: Ventricular enlargement consistent with hydrocephalus. Ventricles are larger than expected for the level of atrophy. Ventricular size has progressed since 01/20/2019. Negative for acute infarct.  No significant chronic ischemia. Right convexity meningioma measuring 17 x 16 mm with calcification on CT. No significant brain edema. There is dense ossification of the falx. There is chronic microhemorrhage in the brain stem on the left at the pontomedullary junction. Small area of microhemorrhage in the right lower pons. Vascular: Normal arterial flow voids. Skull and upper cervical spine: No focal skeletal lesion. Sinuses/Orbits: Mild mucosal edema paranasal sinuses. Negative orbit Other: None IMPRESSION: Ventricular enlargement consistent with mild hydrocephalus. Progressive ventricular dilatation since the CT of 12/31/2018 Right convexity meningioma without brain edema Negative for acute or chronic ischemia. Chronic hemorrhage in the brainstem. Electronically Signed   By: Franchot Gallo M.D.   On:  04/09/2020 07:25   ECHOCARDIOGRAM COMPLETE  Result Date: 04/06/2020    ECHOCARDIOGRAM REPORT   Patient Name:   MESHELLE HOLNESS [facility] Date of Exam: 04/06/2020 Medical Rec #:  [id number]       Height:       63.0 in Accession #:    [id number]      Weight:       158.0 lb Date of Birth:  07-03-1951        BSA:          1.749 m Patient Age:    42 years        BP:           120/60 mmHg Patient Gender: F               HR:           75 bpm. Exam Location:  [facility] Procedure: 2D Echo, Cardiac Doppler and Color Doppler Indications:    Syncope 780.2  History:        Patient has no prior history of Echocardiogram examinations.                 ESRD.  Sonographer:    Sherrie Sport RDCS (AE) Referring Phys: [id number] Athena Masse Diagnosing      Kate Sable MD Phys: IMPRESSIONS  1. Left ventricular ejection fraction, by  estimation, is 60 to 65%. The left ventricle has normal function. The left ventricle has no regional wall motion abnormalities. Left ventricular diastolic parameters were normal.  2. Right ventricular systolic function is normal. The right ventricular size is normal. There is normal pulmonary artery systolic pressure.  3. Right atrial size was mildly dilated.  4. The mitral valve is normal in structure. Mild mitral valve regurgitation.  5. The aortic valve is normal in structure. Aortic valve regurgitation is not visualized.  6. The inferior vena cava is normal in size with greater than 50% respiratory variability, suggesting right atrial pressure of 3 mmHg. FINDINGS  Left Ventricle: Left ventricular ejection fraction, by estimation, is 60 to 65%. The left ventricle has normal function. The left ventricle has no regional wall motion abnormalities. The left ventricular internal cavity size was normal in size. There is  no left ventricular hypertrophy. Left ventricular diastolic parameters were normal. Right Ventricle: The right ventricular size is normal. No increase in right ventricular wall thickness. Right ventricular  systolic function is normal. There is normal pulmonary artery systolic pressure. The tricuspid regurgitant velocity is 2.33 m/s, and  with an assumed right atrial pressure of 3 mmHg, the estimated right ventricular systolic pressure is 31.5 mmHg. Left Atrium: Left atrial size was normal in size. Right Atrium: Right atrial size was mildly dilated. Pericardium: There is no evidence of pericardial effusion. Mitral Valve: The mitral valve is normal in structure. Mild mitral valve regurgitation. Tricuspid Valve: The tricuspid valve is normal in structure. Tricuspid valve regurgitation is trivial. Aortic Valve: The aortic valve is normal in structure. Aortic valve regurgitation is not visualized. Aortic valve mean gradient measures 4.0 mmHg. Aortic valve peak gradient measures 6.7 mmHg. Aortic valve area, by VTI measures 2.72 cm. Pulmonic Valve: The pulmonic valve was not well visualized. Pulmonic valve regurgitation is not visualized. Aorta: The aortic root is normal in size and structure. Venous: The inferior vena cava is normal in size with greater than 50% respiratory variability, suggesting right atrial pressure of 3 mmHg. IAS/Shunts: No atrial level shunt detected by color flow Doppler.  LEFT VENTRICLE PLAX 2D LVIDd:         3.53 cm  Diastology LVIDs:         2.59 cm  LV e' lateral:   7.18 cm/s LV PW:         1.51 cm  LV E/e' lateral: 12.1 LV IVS:        0.75 cm  LV e' medial:    6.74 cm/s LVOT diam:     2.00 cm  LV E/e' medial:  12.9 LV SV:         77 LV SV Index:   44 LVOT Area:     3.14 cm  RIGHT VENTRICLE RV Basal diam:  3.61 cm LEFT ATRIUM             Index       RIGHT ATRIUM           Index LA diam:        2.30 cm 1.31 cm/m  RA Area:     17.20 cm LA Vol (A2C):   44.8 ml 25.61 ml/m RA Volume:   49.40 ml  28.24 ml/m LA Vol (A4C):   36.7 ml 20.98 ml/m LA Biplane Vol: 41.7 ml 23.84 ml/m  AORTIC VALVE                   PULMONIC VALVE AV Area (Vmax):  2.87 cm    PV Vmax:        0.62 m/s AV Area (Vmean):    2.67 cm    PV Peak grad:   1.5 mmHg AV Area (VTI):     2.72 cm    RVOT Peak grad: 3 mmHg AV Vmax:           129.33 cm/s AV Vmean:          88.300 cm/s AV VTI:            0.284 m AV Peak Grad:      6.7 mmHg AV Mean Grad:      4.0 mmHg LVOT Vmax:         118.00 cm/s LVOT Vmean:        75.000 cm/s LVOT VTI:          0.246 m LVOT/AV VTI ratio: 0.87  AORTA Ao Root diam: 3.40 cm MITRAL VALVE                TRICUSPID VALVE MV Area (PHT): 2.86 cm     TR Peak grad:   21.7 mmHg MV Decel Time: 265 msec     TR Vmax:        233.00 cm/s MV E velocity: 87.00 cm/s MV A velocity: 101.00 cm/s  SHUNTS MV E/A ratio:  0.86         Systemic VTI:  0.25 m                             Systemic Diam: 2.00 cm Kate Sable MD Electronically signed by Kate Sable MD Signature Date/Time: 04/06/2020/10:35:57 AM    Final       Labs: BNP (last 3 results) No results for input(s): BNP in the last 8760 hours. Basic Metabolic Panel: Recent Labs  Lab 04/12/20 0459 04/12/20 1448 04/13/20 0528 04/15/20 0930 04/16/20 0431 04/17/20 0452 04/18/20 0458  NA 133*  --  130* 132* 135 135 134*  K 6.0*   < > 5.7* 4.6 4.2 4.4 4.9  CL 94*  --  91* 92* 96* 96* 96*  CO2 24  --  25 24 27 24 24   GLUCOSE 75  --  88 143* 88 86 82  BUN 55*  --  77* 71* 47* 66* 80*  CREATININE 7.23*  --  9.10* 8.24* 5.55* 7.69* 9.24*  CALCIUM 9.0  --  8.8* 9.2 8.8* 9.3 9.0  MG 1.7  --   --   --  1.5* 1.9 1.9  PHOS  --   --   --  5.6*  --   --   --    < > = values in this interval not displayed.   Liver Function Tests: Recent Labs  Lab 04/15/20 0930  ALBUMIN 2.6*   No results for input(s): LIPASE, AMYLASE in the last 168 hours. No results for input(s): AMMONIA in the last 168 hours. CBC: Recent Labs  Lab 04/13/20 0528 04/15/20 0930 04/16/20 0431 04/17/20 0452 04/18/20 0458  WBC 5.4 4.3 6.1 5.3 4.3  HGB 9.2* 9.6* 9.6* 9.5* 9.2*  HCT 27.5* 27.9* 28.2* 28.0* 28.3*  MCV 98.6 95.5 98.3 97.9 100.0  PLT 153 218 217 237 254   Cardiac  Enzymes: No results for input(s): CKTOTAL, CKMB, CKMBINDEX, TROPONINI in the last 168 hours. BNP: Invalid input(s): POCBNP CBG: Recent Labs  Lab 04/11/20 2054  GLUCAP 99   D-Dimer No results for input(s): DDIMER in  the last 72 hours. Hgb A1c No results for input(s): HGBA1C in the last 72 hours. Lipid Profile No results for input(s): CHOL, HDL, LDLCALC, TRIG, CHOLHDL, LDLDIRECT in the last 72 hours. Thyroid function studies No results for input(s): TSH, T4TOTAL, T3FREE, THYROIDAB in the last 72 hours.  Invalid input(s): FREET3 Anemia work up No results for input(s): VITAMINB12, FOLATE, FERRITIN, TIBC, IRON, RETICCTPCT in the last 72 hours. Urinalysis    Component Value Date/Time   COLORURINE YELLOW (A) 08/15/2017 1155   APPEARANCEUR HAZY (A) 08/15/2017 1155   LABSPEC 1.007 08/15/2017 1155   PHURINE 8.0 08/15/2017 1155   GLUCOSEU NEGATIVE 08/15/2017 1155   HGBUR NEGATIVE 08/15/2017 [place] 08/15/2017 1155   KETONESUR NEGATIVE 08/15/2017 1155   PROTEINUR >=300 (A) 08/15/2017 1155   NITRITE NEGATIVE 08/15/2017 1155   LEUKOCYTESUR SMALL (A) 08/15/2017 1155   Sepsis Labs Invalid input(s): PROCALCITONIN,  WBC,  LACTICIDVEN Microbiology Recent Results (from the past 240 hour(s))  SARS Coronavirus 2 by RT PCR (hospital order, performed in [place] hospital lab) Nasopharyngeal Nasopharyngeal Swab     Status: None   Collection Time: 04/17/20  2:36 PM   Specimen: Nasopharyngeal Swab  Result Value Ref Range Status   SARS Coronavirus 2 NEGATIVE NEGATIVE Final    Comment: (NOTE) SARS-CoV-2 target nucleic acids are NOT DETECTED.  The SARS-CoV-2 RNA is generally detectable in upper and lower respiratory specimens during the acute phase of infection. The lowest concentration of SARS-CoV-2 viral copies this assay can detect is 250 copies / mL. A negative result does not preclude SARS-CoV-2 infection and should not be used as the sole basis for treatment or  other patient management decisions.  A negative result may occur with improper specimen collection / handling, submission of specimen other than nasopharyngeal swab, presence of viral mutation(s) within the areas targeted by this assay, and inadequate number of viral copies (<250 copies / mL). A negative result must be combined with clinical observations, patient history, and epidemiological information.  Fact Sheet for Patients:   StrictlyIdeas.no  Fact Sheet for Healthcare Providers: BankingDealers.co.za  This test is not yet approved or  cleared by the [place] FDA and has been authorized for detection and/or diagnosis of SARS-CoV-2 by FDA under an Emergency Use Authorization (EUA).  This EUA will remain in effect (meaning this test can be used) for the duration of the COVID-19 declaration under Section 564(b)(1) of the Act, 21 U.S.C. section 360bbb-3(b)(1), unless the authorization is terminated or revoked sooner.  Performed at [facility], [address] [place], [place] [address]      Total time spend on discharging this patient, including the last patient exam, discussing the hospital stay, instructions for ongoing care as it relates to all pertinent caregivers, as well as preparing the medical discharge records, prescriptions, and/or referrals as applicable, is 30 minutes.    Enzo Bi, MD  [facility] 04/18/2020, 12:40 PM  If 7PM-7AM, please contact night-coverage

## 2020-04-18 NOTE — NC FL2 (Signed)
[place] LEVEL OF CARE SCREENING TOOL     IDENTIFICATION  Patient Name: Madison [name] Birthdate: 09/08/1951 Sex: female Admission Date (Current Location): 04/05/2020  [facility] and [place] Number:  Engineering geologist and Address:  [facility], [address] [place],  [address]      Provider Number: [id number]  Attending Physician Name and Address:  Enzo Bi, MD  Relative Name and Phone Number:  Farren Landa (sister)  [phone] or [phone]    Current Level of Care: Hospital Recommended Level of Care: [place] (Group home) Prior Approval Number:    Date Approved/Denied:   PASRR Number:    Discharge Plan: Other (Comment) ([place])    Current Diagnoses: Patient Active Problem List   Diagnosis Date Noted  . Acute metabolic encephalopathy 82/64/1583  . Rhabdomyolysis 04/06/2020  . Elevated troponin 04/06/2020  . Pneumonia 04/06/2020  . Lactic acidosis 04/06/2020  . Suspected Sepsis ([place]) 04/06/2020  . Possible NSTEMI (non-ST elevated myocardial infarction) ([place]) 04/06/2020  .  Suspected Overdose, undetermined intent, initial encounter 04/06/2020  . Schizoaffective disorder, bipolar type ([place])   . Bipolar affective disorder, current episode manic with psychotic symptoms ([place]) 12/25/2018  . ESRD on hemodialysis ([place]) 12/25/2018  . History of adenomatous polyp of colon 11/20/2018  . Loss of weight 10/17/2017  . Hypernatremia 04/17/2016  . CKD (chronic kidney disease) stage 3, GFR 30-59 ml/min 04/16/2016  . Normocytic anemia 04/16/2016  . Meningioma ([place]) 04/16/2016  . Acute psychosis ([place]) 04/15/2016  . Hyponatremia 04/15/2016  . AKI (acute kidney injury) ([place]) 04/15/2016  . Special screening for malignant neoplasms, colon   . Thrombocytopenia ([place]) 01/05/2016  . Altered mental status 04/19/2015    Orientation RESPIRATION BLADDER Height & Weight     Self, Time, Situation, Place  Normal  Continent Weight: 68.9 kg Height:  5\' 1"  (154.9 cm)  BEHAVIORAL SYMPTOMS/MOOD NEUROLOGICAL BOWEL NUTRITION STATUS      Continent Diet (Renal diet 1266ml fluid restriction)  AMBULATORY STATUS COMMUNICATION OF NEEDS Skin   Limited Assist Verbally Normal                       Personal Care Assistance Level of Assistance  Bathing, Feeding, Dressing Bathing Assistance: Limited assistance Feeding assistance: Limited assistance Dressing Assistance: Limited assistance     Functional Limitations Info             SPECIAL CARE FACTORS FREQUENCY                       Contractures Contractures Info: Not present    Additional Factors Info  Code Status, Allergies, Psychotropic Code Status Info: Full Allergies Info: NKA Psychotropic Info: Schizo affective disorder bipolar type         Current Medications (04/18/2020):  This is the current hospital active medication list Current Facility-Administered Medications  Medication Dose Route Frequency Provider Last Rate Last Admin  . acetaminophen (TYLENOL) tablet 650 mg  650 mg Oral Q4H PRN Athena Masse, MD      . ALPRAZolam Duanne Moron) tablet 0.25 mg  0.25 mg Oral TID PRN Nicole Kindred A, DO   0.25 mg at 04/11/20 0227  . aspirin EC tablet 81 mg  81 mg Oral Daily Athena Masse, MD   81 mg at 04/17/20 0914  . bisacodyl (DULCOLAX) suppository 10 mg  10 mg Rectal Daily PRN Sharion Settler, NP      . Chlorhexidine  Gluconate Cloth 2 % PADS 6 each  6 each Topical Q0600 Anthonette Legato, MD   6 each at 04/18/20 0511  . dextromethorphan-guaiFENesin (MUCINEX DM) 30-600 MG per 12 hr tablet 1 tablet  1 tablet Oral BID Nicole Kindred A, DO   1 tablet at 04/17/20 2206  . divalproex (DEPAKOTE) DR tablet 250 mg  250 mg Oral BID Nicole Kindred A, DO   250 mg at 04/17/20 2206  . docusate sodium (COLACE) capsule 100 mg  100 mg Oral BID Sharion Settler, NP   100 mg at 04/17/20 2206  . DULoxetine (CYMBALTA) DR capsule 20 mg  20 mg Oral Daily  Eulas Post, MD   20 mg at 04/17/20 0919  . epoetin alfa (EPOGEN) injection 10,000 Units  10,000 Units Intravenous Q M,W,F-HD Lavonia Dana, MD   10,000 Units at 04/15/20 1432  . ferric citrate (AURYXIA) tablet 420 mg  420 mg Oral TID WC Kolluru, Sarath, MD   420 mg at 04/18/20 0850  . gabapentin (NEURONTIN) capsule 100 mg  100 mg Oral BID Lorella Nimrod, MD   100 mg at 04/17/20 2207  . heparin injection 5,000 Units  5,000 Units Subcutaneous Q8H Nicole Kindred A, DO   5,000 Units at 04/18/20 0510  . metoprolol tartrate (LOPRESSOR) tablet 12.5 mg  12.5 mg Oral BID Nicole Kindred A, DO   12.5 mg at 04/17/20 2206  . mupirocin ointment (BACTROBAN) 2 %   Nasal BID Nicole Kindred A, DO   1 application at 15/17/61 2208  . nitroGLYCERIN (NITROSTAT) SL tablet 0.4 mg  0.4 mg Sublingual Q5 Min x 3 PRN Athena Masse, MD      . OLANZapine (ZYPREXA) tablet 15 mg  15 mg Oral QHS Nicole Kindred A, DO   15 mg at 04/17/20 2206  . ondansetron (ZOFRAN) injection 4 mg  4 mg Intravenous Q6H PRN Athena Masse, MD      . pantoprazole (PROTONIX) EC tablet 40 mg  40 mg Oral Daily Nicole Kindred A, DO   40 mg at 04/17/20 0916  . sodium zirconium cyclosilicate (LOKELMA) packet 5 g  5 g Oral Daily Kolluru, Sarath, MD   5 g at 04/17/20 0919  . thiamine tablet 50 mg  50 mg Oral Daily Nicole Kindred A, DO   50 mg at 04/17/20 6073     Discharge Medications: Medication List    STOP taking these medications   senna 8.6 MG Tabs tablet Commonly known as: SENOKOT     TAKE these medications   ALPRAZolam 0.25 MG tablet Commonly known as: XANAX Take 1 tablet (0.25 mg total) by mouth 3 (three) times daily as needed for anxiety.   aspirin 81 MG EC tablet Take 1 tablet (81 mg total) by mouth daily. Swallow whole.   Auryxia 1 GM 210 MG(Fe) tablet Generic drug: ferric citrate Take 210 mg by mouth 3 (three) times daily with meals.   DIALYVITE TABLET Tabs Take 1 tablet by mouth daily.   divalproex 250  MG DR tablet Commonly known as: DEPAKOTE Take 1 tablet (250 mg total) by mouth 2 (two) times daily.   docusate sodium 100 MG capsule Commonly known as: COLACE Take 1 capsule (100 mg total) by mouth 2 (two) times daily.   DULoxetine 20 MG capsule Commonly known as: CYMBALTA Take 1 capsule (20 mg total) by mouth daily.   epoetin alfa 10000 UNIT/ML injection Commonly known as: EPOGEN Inject 1 mL (10,000 Units total) into the vein every Monday, Wednesday, and  Friday with hemodialysis.   gabapentin 100 MG capsule Commonly known as: NEURONTIN Take 1 capsule (100 mg total) by mouth 2 (two) times daily.   OLANZapine 15 MG tablet Commonly known as: ZYPREXA Take 15 mg by mouth at bedtime.   pantoprazole 40 MG tablet Commonly known as: PROTONIX TAKE 1 TABLET BY MOUTH ONCE DAILY.   sodium zirconium cyclosilicate 5 g packet Commonly known as: LOKELMA Take 5 g by mouth daily. What changed:   medication strength  how much to take   thiamine 100 MG tablet Commonly known as: Vitamin B-1 Take 0.5 tablets (50 mg total) by mouth daily.        Relevant Imaging Results:  Relevant Lab Results:   Additional Information ss# [id number]  Patient has Diaylsis MWF at [facility]  Shelbie Hutching, RN

## 2020-04-18 NOTE — Progress Notes (Signed)
[facility]  ROUNDING NOTE   Subjective:   Patient seen during dialysis Tolerating well    HEMODIALYSIS FLOWSHEET:  Blood Flow Rate (mL/min): 200 mL/min Arterial Pressure (mmHg): -30 mmHg Venous Pressure (mmHg): 80 mmHg Transmembrane Pressure (mmHg): 30 mmHg Ultrafiltration Rate (mL/min): 300 mL/min Dialysate Flow Rate (mL/min): 800 ml/min Conductivity: Machine : 13.9 Conductivity: Machine : 13.9 Dialysis Fluid Bolus: Normal Saline Bolus Amount (mL): 250 mL   Length of stay 12   Objective:  Vital signs in last 24 hours:  Temp:  [97.3 F (36.3 C)-98.6 F (37 C)] 98.6 F (37 C) (07/26 1344) Pulse Rate:  [53-73] 68 (07/26 1344) Resp:  [11-19] 16 (07/26 1255) BP: (81-121)/(39-79) 105/72 (07/26 1344) SpO2:  [95 %-100 %] 98 % (07/26 1344)  Weight change:  Filed Weights   04/05/20 1901 04/06/20 1752 04/07/20 0632  Weight: 71.7 kg 56.1 kg 68.9 kg    Intake/Output: No intake/output data recorded.   Intake/Output this shift:  Total I/O In: -  Out: 1000 [Other:1000]  Physical Exam: General: NAD  Eyes: Anicteric  Neck: Supple,    Lungs:  Clear to auscultation, normal effort  Heart: Regular rate and rhythm  Abdomen:  Soft, nontender, bowel sounds present  Extremities: No peripheral edema.  Neurologic: Awake, alert, conversant, oriented to self  Skin: No rashes  Access: Left  Upper extremity AVF     Basic Metabolic Panel: Recent Labs  Lab 04/12/20 0459 04/12/20 1448 04/13/20 0528 04/13/20 0528 04/15/20 0930 04/15/20 0930 04/16/20 0431 04/17/20 0452 04/18/20 0458  NA 133*  --  130*  --  132*  --  135 135 134*  K 6.0*   < > 5.7*  --  4.6  --  4.2 4.4 4.9  CL 94*  --  91*  --  92*  --  96* 96* 96*  CO2 24  --  25  --  24  --  27 24 24   GLUCOSE 75  --  88  --  143*  --  88 86 82  BUN 55*  --  77*  --  71*  --  47* 66* 80*  CREATININE 7.23*  --  9.10*  --  8.24*  --  5.55* 7.69* 9.24*  CALCIUM 9.0  --  8.8*   < > 9.2   < > 8.8* 9.3 9.0  MG  1.7  --   --   --   --   --  1.5* 1.9 1.9  PHOS  --   --   --   --  5.6*  --   --   --   --    < > = values in this interval not displayed.    Liver Function Tests: Recent Labs  Lab 04/15/20 0930  ALBUMIN 2.6*   No results for input(s): LIPASE, AMYLASE in the last 168 hours. No results for input(s): AMMONIA in the last 168 hours.  CBC: Recent Labs  Lab 04/13/20 0528 04/15/20 0930 04/16/20 0431 04/17/20 0452 04/18/20 0458  WBC 5.4 4.3 6.1 5.3 4.3  HGB 9.2* 9.6* 9.6* 9.5* 9.2*  HCT 27.5* 27.9* 28.2* 28.0* 28.3*  MCV 98.6 95.5 98.3 97.9 100.0  PLT 153 218 217 237 254    Cardiac Enzymes: No results for input(s): CKTOTAL, CKMB, CKMBINDEX, TROPONINI in the last 168 hours.  BNP: Invalid input(s): POCBNP  CBG: Recent Labs  Lab 04/11/20 2054  GLUCAP 99    Microbiology: Results for orders placed or performed during the hospital encounter  of 04/05/20  Culture, blood (routine x 2)     Status: None   Collection Time: 04/05/20 11:08 PM   Specimen: BLOOD RIGHT FOREARM  Result Value Ref Range Status   Specimen Description BLOOD RIGHT FOREARM  Final   Special Requests   Final    BOTTLES DRAWN AEROBIC ONLY Blood Culture adequate volume   Culture   Final    NO GROWTH 5 DAYS Performed at [facility], [address] [place], [place] [address]    Report Status 04/11/2020 FINAL  Final  MRSA PCR Screening     Status: Abnormal   Collection Time: 04/05/20 11:15 PM   Specimen: Nasopharyngeal  Result Value Ref Range Status   MRSA by PCR POSITIVE (A) NEGATIVE Final    Comment:        The GeneXpert MRSA Assay (FDA approved for NASAL specimens only), is one component of a comprehensive MRSA colonization surveillance program. It is not intended to diagnose MRSA infection nor to guide or monitor treatment for MRSA infections. RESULT CALLED TO, READ BACK BY AND VERIFIED WITH: LILLY WALKER@1320  ON 04/07/20 BY HKP Performed at [facility], [address] [place], [place] [address]   SARS Coronavirus 2 by RT PCR (hospital order, performed in [facility] hospital lab) Nasopharyngeal Nasopharyngeal Swab     Status: None   Collection Time: 04/05/20 11:31 PM   Specimen: Nasopharyngeal Swab  Result Value Ref Range Status   SARS Coronavirus 2 NEGATIVE NEGATIVE Final    Comment: (NOTE) SARS-CoV-2 target nucleic acids are NOT DETECTED.  The SARS-CoV-2 RNA is generally detectable in upper and lower respiratory specimens during the acute phase of infection. The lowest concentration of SARS-CoV-2 viral copies this assay can detect is 250 copies / mL. A negative result does not preclude SARS-CoV-2 infection and should not be used as the sole basis for treatment or other patient management decisions.  A negative result may occur with improper specimen collection / handling, submission of specimen other than nasopharyngeal swab, presence of viral mutation(s) within the areas targeted by this assay, and inadequate number of viral copies (<250 copies / mL). A negative result must be combined with clinical observations, patient history, and epidemiological information.  Fact Sheet for Patients:   StrictlyIdeas.no  Fact Sheet for Healthcare Providers: BankingDealers.co.za  This test is not yet approved or  cleared by the [place] FDA and has been authorized for detection and/or diagnosis of SARS-CoV-2 by FDA under an Emergency Use Authorization (EUA).  This EUA will remain in effect (meaning this test can be used) for the duration of the COVID-19 declaration under Section 564(b)(1) of the Act, 21 U.S.C. section 360bbb-3(b)(1), unless the authorization is terminated or revoked sooner.  Performed at [facility], [address] [place], [place] [address]   Culture, blood (routine x 2)     Status: None   Collection Time: 04/06/20 12:27 AM   Specimen: BLOOD RIGHT HAND  Result Value Ref  Range Status   Specimen Description BLOOD RIGHT HAND  Final   Special Requests   Final    BOTTLES DRAWN AEROBIC ONLY Blood Culture adequate volume   Culture   Final    NO GROWTH 5 DAYS Performed at [facility], [address] [place], [place] [address]    Report Status 04/11/2020 FINAL  Final  SARS Coronavirus 2 by RT PCR (hospital order, performed in [facility] hospital lab) Nasopharyngeal Nasopharyngeal Swab     Status: None  Collection Time: 04/17/20  2:36 PM   Specimen: Nasopharyngeal Swab  Result Value Ref Range Status   SARS Coronavirus 2 NEGATIVE NEGATIVE Final    Comment: (NOTE) SARS-CoV-2 target nucleic acids are NOT DETECTED.  The SARS-CoV-2 RNA is generally detectable in upper and lower respiratory specimens during the acute phase of infection. The lowest concentration of SARS-CoV-2 viral copies this assay can detect is 250 copies / mL. A negative result does not preclude SARS-CoV-2 infection and should not be used as the sole basis for treatment or other patient management decisions.  A negative result may occur with improper specimen collection / handling, submission of specimen other than nasopharyngeal swab, presence of viral mutation(s) within the areas targeted by this assay, and inadequate number of viral copies (<250 copies / mL). A negative result must be combined with clinical observations, patient history, and epidemiological information.  Fact Sheet for Patients:   StrictlyIdeas.no  Fact Sheet for Healthcare Providers: BankingDealers.co.za  This test is not yet approved or  cleared by the [place] FDA and has been authorized for detection and/or diagnosis of SARS-CoV-2 by FDA under an Emergency Use Authorization (EUA).  This EUA will remain in effect (meaning this test can be used) for the duration of the COVID-19 declaration under Section 564(b)(1) of the Act, 21 U.S.C. section  360bbb-3(b)(1), unless the authorization is terminated or revoked sooner.  Performed at [facility], [address] [place], [place] [address]     Coagulation Studies: No results for input(s): LABPROT, INR in the last 72 hours.  Urinalysis: No results for input(s): COLORURINE, LABSPEC, PHURINE, GLUCOSEU, HGBUR, BILIRUBINUR, KETONESUR, PROTEINUR, UROBILINOGEN, NITRITE, LEUKOCYTESUR in the last 72 hours.  Invalid input(s): APPERANCEUR    Imaging: No results found.   Medications:    . aspirin EC  81 mg Oral Daily  . Chlorhexidine Gluconate Cloth  6 each Topical Q0600  . dextromethorphan-guaiFENesin  1 tablet Oral BID  . divalproex  250 mg Oral BID  . docusate sodium  100 mg Oral BID  . DULoxetine  20 mg Oral Daily  . epoetin (EPOGEN/PROCRIT) injection  10,000 Units Intravenous Q M,W,F-HD  . ferric citrate  420 mg Oral TID WC  . gabapentin  100 mg Oral BID  . heparin injection (subcutaneous)  5,000 Units Subcutaneous Q8H  . metoprolol tartrate  12.5 mg Oral BID  . mupirocin ointment   Nasal BID  . OLANZapine  15 mg Oral QHS  . pantoprazole  40 mg Oral Daily  . sodium zirconium cyclosilicate  5 g Oral Daily  . thiamine  50 mg Oral Daily   acetaminophen, ALPRAZolam, bisacodyl, nitroGLYCERIN, ondansetron (ZOFRAN) IV  Assessment/ Plan:  [name] is a 69 y.o. black female  with end stage renal disease on hemodialysis, schizophrenia, hypertension, bipolar disorder, who was admitted to [facility] on 04/05/2020 for NSTEMI (non-ST elevated myocardial infarction) ([place]) [I21.4] Non-traumatic rhabdomyolysis [M62.82] Altered mental status, unspecified altered mental status type [V89.38] Acute metabolic encephalopathy [B01.75] Sepsis, due to unspecified organism, unspecified whether acute organ dysfunction present ([place]) [A41.9]   [facility] (Dr. Leontine Locket) 55.5kg Left AVF  1. ESRD on HD with hyperkalemia: Routine hemodialysis to be  performed today  2. Anemia of Chronic kidney disease:   Lab Results  Component Value Date   HGB 9.2 (L) 04/18/2020   -Administer Epogen 10,000 IV with dialysis   3. Secondary hyperparathyroidism with hyperphosphatemia.  Lab Results  Component Value Date   CALCIUM 9.0 04/18/2020  PHOS 5.6 (H) 04/15/2020  Maintain the patient on Auryxia.  Continue to periodically monitor bone mineral metabolism parameters.   4.  Major depression, severe, recurrent, generalized anxiety Overdose attempt. Evaluated by psychiatrist.    LOS: 12 Azka Steger Candiss Norse 7/26/20213:08 PM

## 2020-04-19 ENCOUNTER — Other Ambulatory Visit: Payer: Self-pay | Admitting: Hospitalist

## 2020-05-04 IMAGING — MG DIGITAL SCREENING BILATERAL MAMMOGRAM WITH TOMO AND CAD
8 series · 9 of 24 positions shown · non-contrast
Comparison: None

CLINICAL DATA: Screening.

EXAM:
DIGITAL SCREENING BILATERAL MAMMOGRAM WITH TOMO AND CAD

[L CC synth-2D]
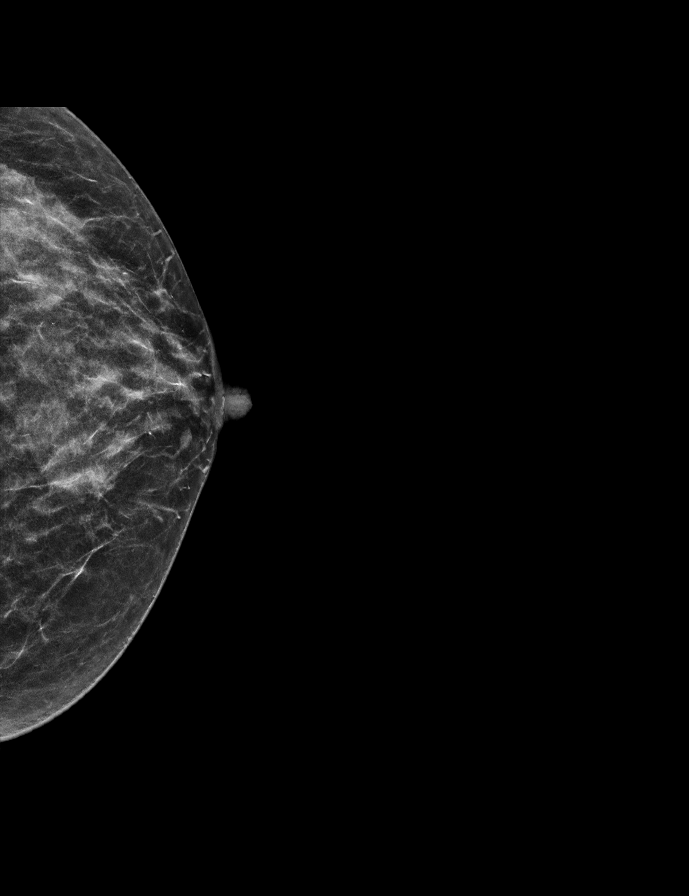

[R MLO synth-2D]
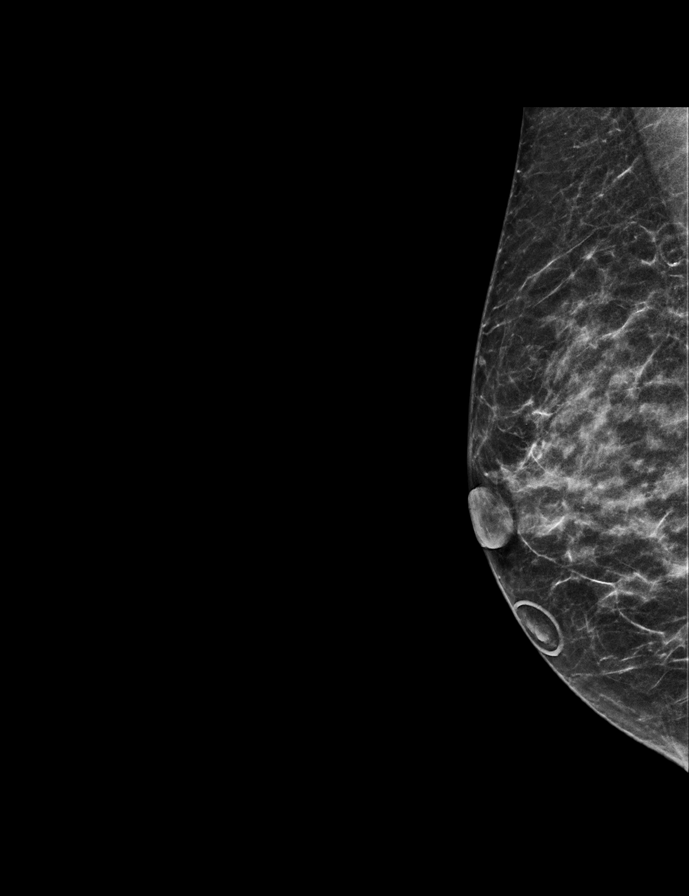

[L MLO synth-2D]
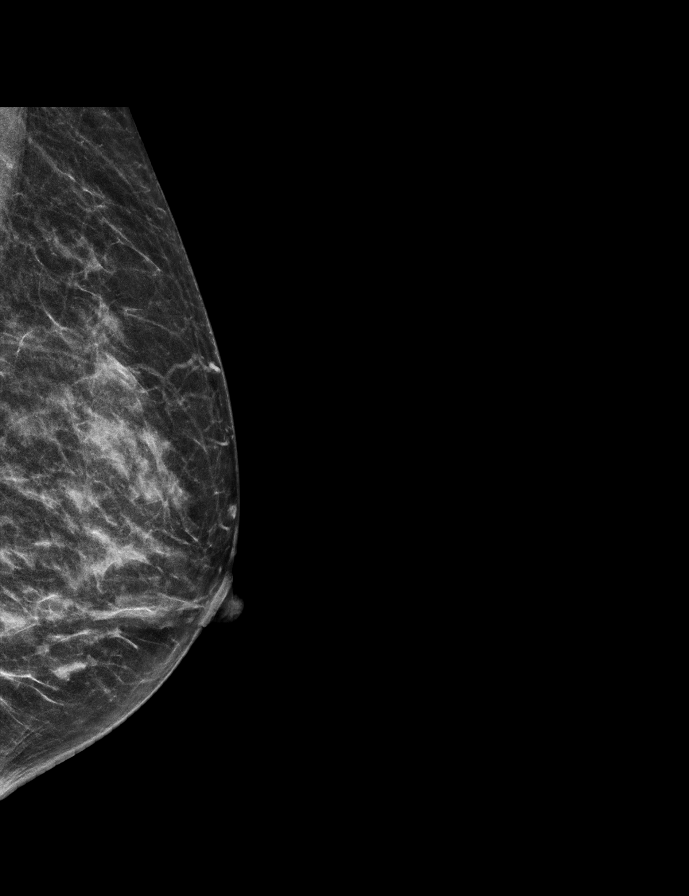

[R CC synth-2D]
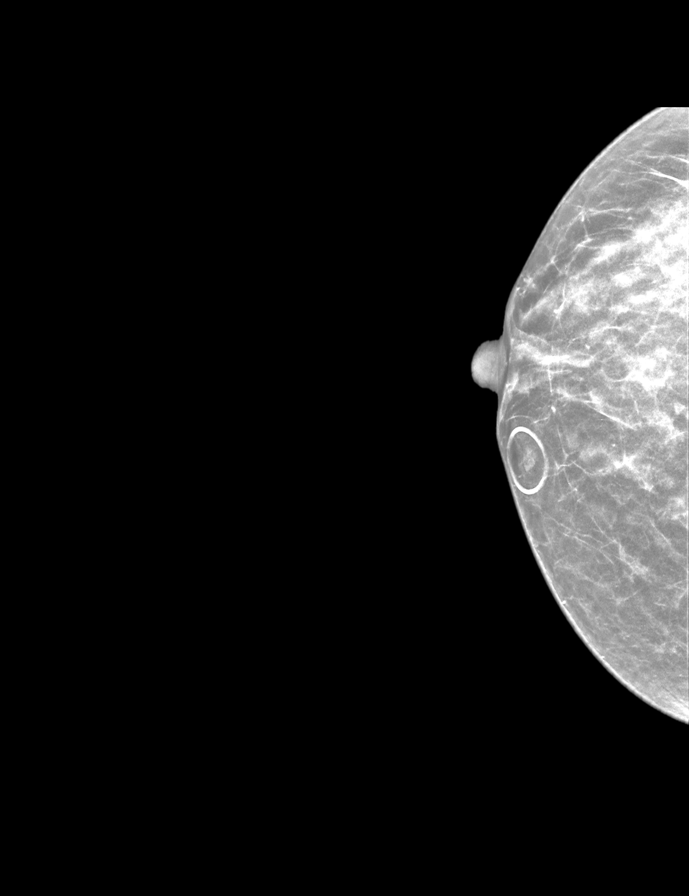

[R MLO tomo · 2 of 48 frames shown]
[frame 16/48]
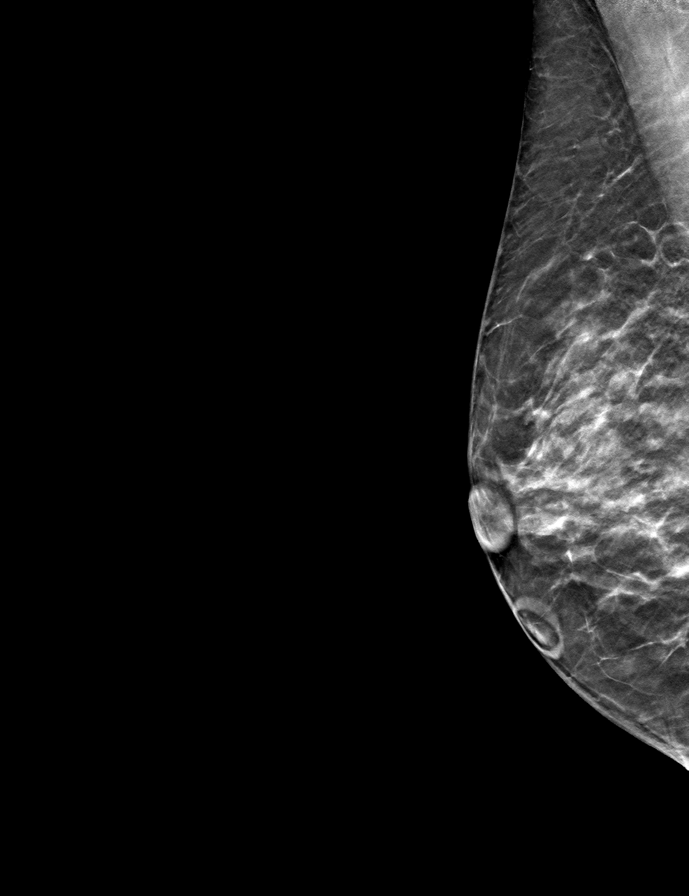
[frame 25/48]
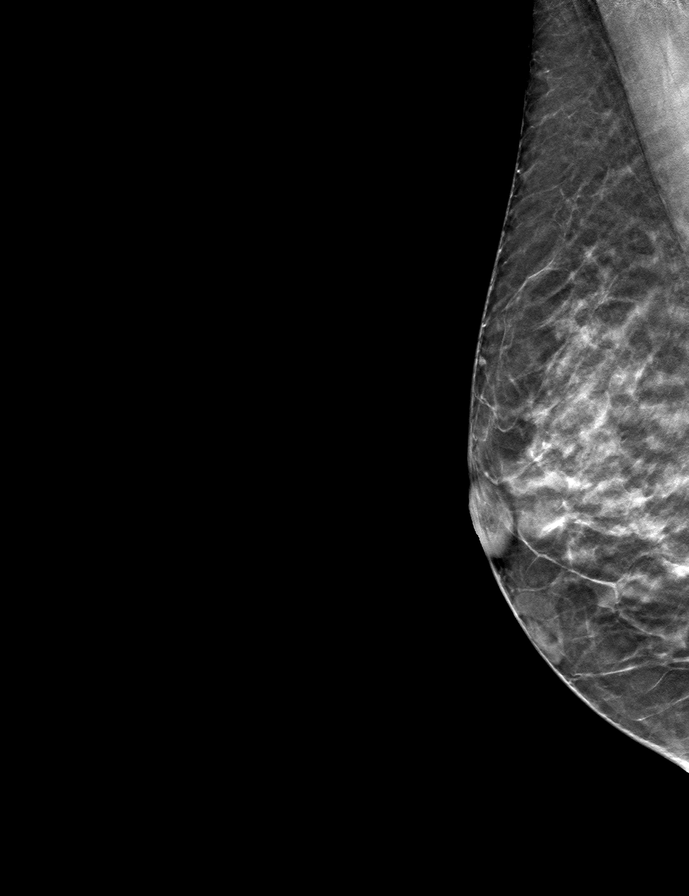

[L MLO tomo · tomo slice 27/52.0]
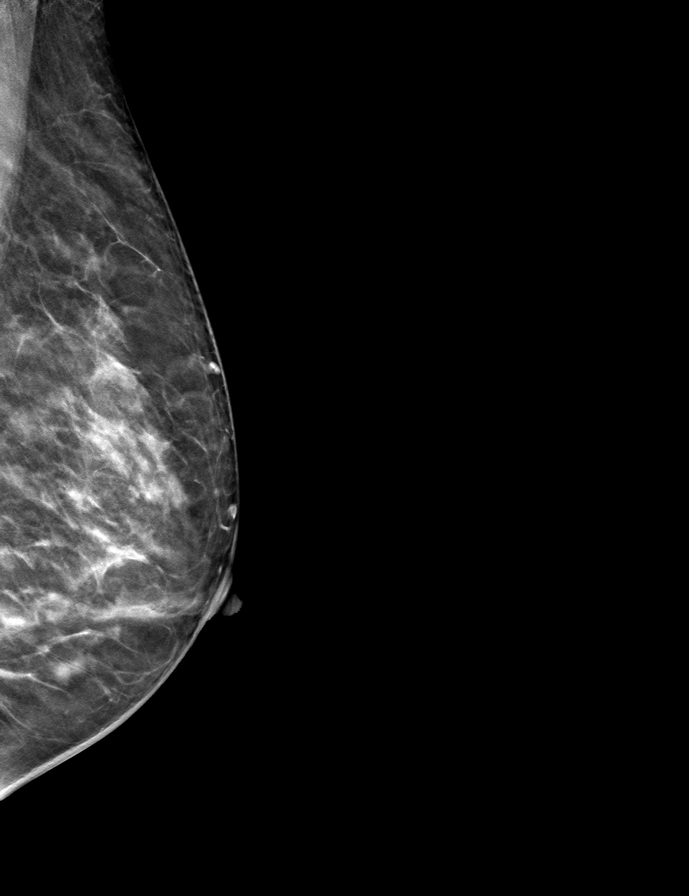

[L CC tomo · tomo slice 24/47.0]
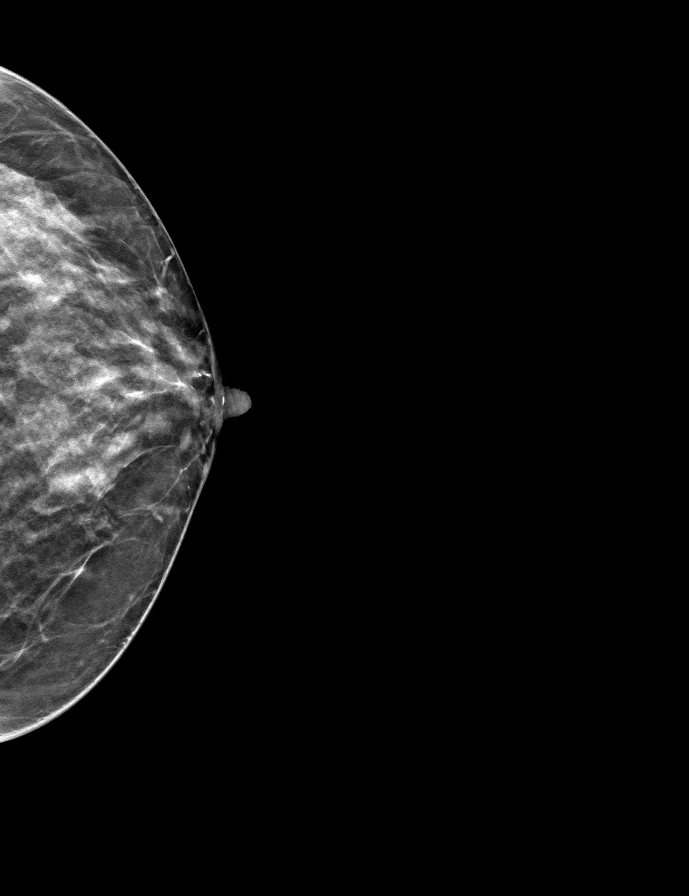

[R CC tomo · tomo slice 23/46.0]
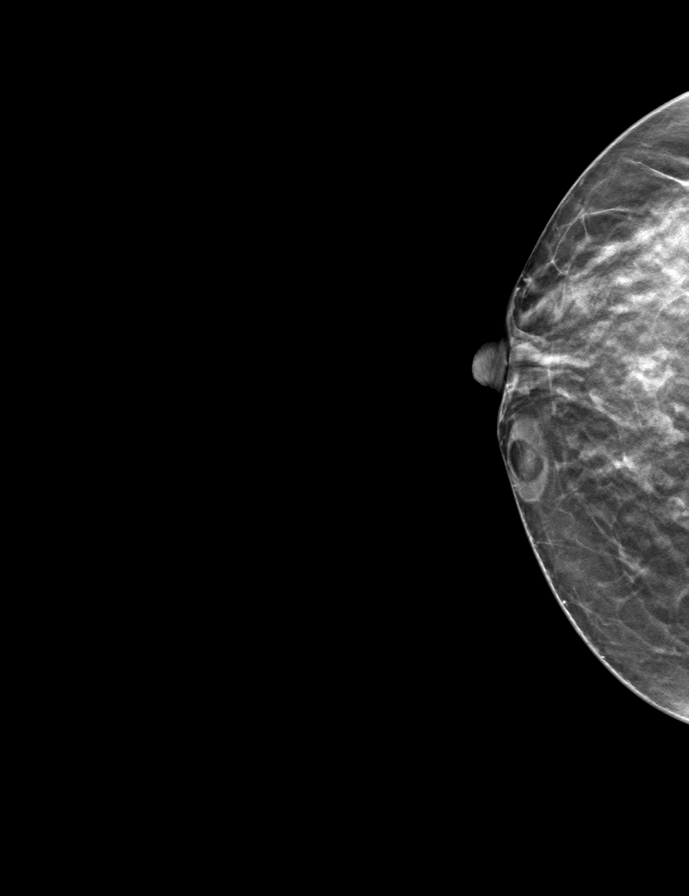

[9 of 24 positions shown; findings below may reference images not displayed]

ACR Breast Density Category c: The breast tissue is heterogeneously
dense, which may obscure small masses.
FINDINGS: In the left breast, possible masses warrant further evaluation. In
the right breast, no findings suspicious for malignancy.

Images were processed with CAD.
IMPRESSION: Further evaluation is suggested for possible masses in the breast.

RECOMMENDATION:
Diagnostic mammogram and possibly ultrasound of the left breast.
(Code:G0-D-NNJ)

The patient will be contacted regarding the findings, and additional
imaging will be scheduled.

BI-RADS CATEGORY  0: Incomplete. Need additional imaging evaluation
and/or prior mammograms for comparison.

## 2021-10-31 ENCOUNTER — Other Ambulatory Visit: Payer: Self-pay

## 2021-10-31 ENCOUNTER — Encounter: Payer: Self-pay | Admitting: Adult Health

## 2021-10-31 ENCOUNTER — Ambulatory Visit (INDEPENDENT_AMBULATORY_CARE_PROVIDER_SITE_OTHER): Payer: Medicare Other | Admitting: Adult Health

## 2021-10-31 VITALS — BP 99/61 | HR 83 | Ht 64.0 in | Wt 124.0 lb

## 2021-10-31 DIAGNOSIS — Z1211 Encounter for screening for malignant neoplasm of colon: Secondary | ICD-10-CM | POA: Diagnosis not present

## 2021-10-31 DIAGNOSIS — Z90710 Acquired absence of both cervix and uterus: Secondary | ICD-10-CM

## 2021-10-31 DIAGNOSIS — Z124 Encounter for screening for malignant neoplasm of cervix: Secondary | ICD-10-CM | POA: Diagnosis not present

## 2021-10-31 DIAGNOSIS — Z01419 Encounter for gynecological examination (general) (routine) without abnormal findings: Secondary | ICD-10-CM | POA: Insufficient documentation

## 2021-10-31 LAB — HEMOCCULT GUIAC POC 1CARD (OFFICE): Fecal Occult Blood, POC: NEGATIVE

## 2021-10-31 NOTE — Progress Notes (Signed)
Patient ID: Madison [name], female   DOB: [birth year], 71 y.o.   MRN: [id number] History of Present Illness: Madison [name] is a 71 year old black female, single, sp hysterectomy, in for pelvic exam. She resides at [facility]. She was last seen by Dr Elonda Husky for vaginal discharge and treated for trich 12/02/2018.  PCP is Dr Bartolo Darter.   Current Medications, Allergies, Past Medical History, Past Surgical History, Family History and Social History were reviewed in [employer] record.     Review of Systems: Patient denies any headaches, hearing loss, fatigue, blurred vision, shortness of breath, chest pain, abdominal pain, problems with bowel movements, urination(on dialysis) or intercourse.(No sex in over 3 years she says).  No joint pain or mood swings.  She denies any discharge or itching.    Physical Exam:BP 99/61 (BP Location: Right Arm, Patient Position: Sitting, Cuff Size: Normal)    Pulse 83    Ht 5\' 4"  (1.626 m)    Wt 124 lb (56.2 kg)    BMI 21.28 kg/m   General:  Well developed, well nourished, no acute distress Skin:  Warm and dry Neck:  Midline trachea, normal thyroid, good ROM, no lymphadenopathy,no carotid bruits heard Lungs; Clear to auscultation bilaterally Breast: deferred Cardiovascular: Regular rate and rhythm Abdomen:  Soft, non tender, no hepatosplenomegaly Pelvic:  External genitalia is normal in appearance, no lesions.  The vagina is pale and atrophic, Urethra has no lesions or masses. The cervix and uterus are absent. No adnexal masses or tenderness noted.Bladder is non tender, no masses felt. Rectal: Good sphincter tone, no polyps, or hemorrhoids felt.  Hemoccult negative. Extremities/musculoskeletal:  No swelling or varicosities noted, no clubbing or cyanosis Psych:  No mood changes, alert and cooperative,seems happy Examination chaperoned by Celene Squibb LPN  Impression and Plan: 1. Encounter for gynecological examination Physical with PCP Labs with  PCP Follow up prn   2. Encounter for screening fecal occult blood testing  3. S/P hysterectomy

## 2022-09-20 ENCOUNTER — Ambulatory Visit: Payer: Medicare Other | Attending: Internal Medicine | Admitting: Internal Medicine

## 2022-09-20 ENCOUNTER — Encounter: Payer: Self-pay | Admitting: Internal Medicine

## 2022-09-20 VITALS — BP 110/63 | HR 84 | Wt 115.8 lb

## 2022-09-20 DIAGNOSIS — R011 Cardiac murmur, unspecified: Secondary | ICD-10-CM

## 2022-09-20 NOTE — Patient Instructions (Signed)
Medication Instructions:  Your physician recommends that you continue on your current medications as directed. Please refer to the Current Medication list given to you today.  *If you need a refill on your cardiac medications before your next appointment, please call your pharmacy*   Lab Work: NONE ordered at this time of appointment   If you have labs (blood work) drawn today and your tests are completely normal, you will receive your results only by: [place] (if you have MyChart) OR A paper copy in the mail If you have any lab test that is abnormal or we need to change your treatment, we will call you to review the results.   Testing/Procedures: Your physician has requested that you have an echocardiogram. Echocardiography is a painless test that uses sound waves to create images of your heart. It provides your doctor with information about the size and shape of your heart and how well your heart's chambers and valves are working. This procedure takes approximately one hour. There are no restrictions for this procedure. Please do NOT wear cologne, perfume, aftershave, or lotions (deodorant is allowed). Please arrive 15 minutes prior to your appointment time.    Follow-Up: At [facility], you and your health needs are our priority.  As part of our continuing mission to provide you with exceptional heart care, we have created designated Provider Care Teams.  These Care Teams include your primary Cardiologist (physician) and Advanced Practice Providers (APPs -  Physician Assistants and Nurse Practitioners) who all work together to provide you with the care you need, when you need it.  We recommend signing up for the patient portal called "MyChart".  Sign up information is provided on this After Visit Summary.  MyChart is used to connect with patients for Virtual Visits (Telemedicine).  Patients are able to view lab/test results, encounter notes, upcoming appointments, etc.   Non-urgent messages can be sent to your provider as well.   To learn more about what you can do with MyChart, go to [email or web address].    Your next appointment:    As needed  The format for your next appointment:   In Person  Provider:   Claudina Lick, MD    Other Instructions   Important Information About Sugar

## 2022-09-20 NOTE — Progress Notes (Signed)
Cardiology Office Note  Date: 09/20/2022   ID: Madison [name], DOB 1950-11-24, MRN [id number]  PCP:  Madison Schwalbe, MD  Cardiologist:  None Electrophysiologist:  None   Reason for Office Visit: Evaluation of murmur at the request of Dr. Bartolo Madison [name]   History of Present Illness: Madison [name] is a 71 y.o. female known to have ESRD on dialysis, seizure affective bipolar disorder, mild dementia was referred to cardiology clinic for evaluation of murmur. Accompanied by caregiver. She lives in a facility.  Patient is alert and oriented to herself. She denied having chest pain, SOB, dizziness, syncope and keeps talking about her mother, neuropathy and lithium. Limited history obtained from the patient. I reviewed the medical records on the chart and those available with the patient which did not provide any additional information.  Past Medical History:  Diagnosis Date   Anxiety    ESRD (end stage renal disease) on dialysis ([place])    Neuropathy    Schizophrenia ([place])     Past Surgical History:  Procedure Laterality Date   CHOLECYSTECTOMY     COLONOSCOPY N/A 01/20/2016   Dr. Oneida [name]: 12 mm tubular adenoma removed from the transverse colon, 4 hyperplastic polyps removed from the rectum and sigmoid colon.  Next colonoscopy planned for April 2020.   ESOPHAGOGASTRODUODENOSCOPY N/A 12/19/2017   Procedure: ESOPHAGOGASTRODUODENOSCOPY (EGD);  Surgeon: Madison Binder, MD;  [facility];  Service: Endoscopy;  Laterality: N/A;  8:30am   fistula left arm     GIVENS CAPSULE STUDY N/A 01/14/2018   Procedure: GIVENS CAPSULE STUDY;  Surgeon: Madison Binder, MD;  [facility];  Service: Endoscopy;  Laterality: N/A;  7:30am   HEMICOLECTOMY Right    TUBAL LIGATION      Current Outpatient Medications  Medication Sig Dispense Refill   acetaminophen (TYLENOL) 325 MG tablet Take 650 mg by mouth every 6 (six) hours as needed.     ALPRAZolam (XANAX) 0.25 MG tablet Take 1 tablet (0.25 mg  total) by mouth 3 (three) times daily as needed for anxiety. 30 tablet 0   aspirin EC 81 MG tablet Take 81 mg by mouth daily. Swallow whole.     B Complex-C-Folic Acid (DIALYVITE TABLET) TABS Take 1 tablet by mouth daily.     benztropine (COGENTIN) 1 MG tablet Take 1 mg by mouth 2 (two) times daily.     docusate sodium (COLACE) 100 MG capsule Take 1 capsule (100 mg total) by mouth 2 (two) times daily. 10 capsule 0   DULoxetine (CYMBALTA) 20 MG capsule Take 1 capsule (20 mg total) by mouth daily.  3   epoetin alfa (EPOGEN,PROCRIT) 17001 UNIT/ML injection Inject 1 mL (10,000 Units total) into the vein every Monday, Wednesday, and Friday with hemodialysis. 1 mL 1   ferric citrate (AURYXIA) 1 GM 210 MG(Fe) tablet Take 210 mg by mouth 3 (three) times daily with meals.      gabapentin (NEURONTIN) 100 MG capsule Take 1 capsule (100 mg total) by mouth 2 (two) times daily. 60 capsule 1   loperamide (IMODIUM) 2 MG capsule Take by mouth as needed for diarrhea or loose stools.     LORazepam (ATIVAN) 0.5 MG tablet Take 0.5 mg by mouth every 8 (eight) hours.     Meloxicam 5 MG CAPS Take 1 capsule by mouth daily.     midodrine (PROAMATINE) 10 MG tablet Take 10 mg by mouth 3 (three) times daily.     pantoprazole (PROTONIX) 40 MG tablet TAKE  1 TABLET BY MOUTH ONCE DAILY. 90 tablet 3   risperiDONE (RISPERDAL M-TABS) 1 MG disintegrating tablet Take 1 mg by mouth daily.     senna (SENOKOT) 8.6 MG TABS tablet Take 1 tablet by mouth.     sodium zirconium cyclosilicate (LOKELMA) 10 g PACK packet Take 10 g by mouth.     thiamine (VITAMIN B-1) 100 MG tablet Take 0.5 tablets (50 mg total) by mouth daily. 30 tablet 0   valproic acid (DEPAKENE) 250 MG/5ML solution Take 250 mg by mouth 3 (three) times daily.     No current facility-administered medications for this visit.   Allergies:  Patient has no known allergies.   Social History: The patient  reports that she has never smoked. She has never used smokeless tobacco.  She reports that she does not drink alcohol and does not use drugs.   Family History: The patient's family history includes Alzheimer's disease in her mother; Cancer in her mother; Diabetes in her mother; Kidney failure in her sister; Prostate cancer in her father.   ROS:  Please see the history of present illness. Otherwise, complete review of systems is positive for none.  All other systems are reviewed and negative.   Physical Exam: VS:  BP 110/63 (BP Location: Right Arm, Patient Position: Sitting, Cuff Size: Normal)   Pulse 84   Wt 115 lb 12.8 oz (52.5 kg)   BMI 19.88 kg/m , BMI Body mass index is 19.88 kg/m.  Wt Readings from Last 3 Encounters:  09/20/22 115 lb 12.8 oz (52.5 kg)  10/31/21 124 lb (56.2 kg)  04/07/20 152 lb (68.9 kg)    General: Patient appears comfortable at rest. HEENT: Conjunctiva and lids normal, oropharynx clear with moist mucosa. Neck: Supple, no elevated JVP or carotid bruits, no thyromegaly. Lungs: Clear to auscultation, nonlabored breathing at rest. Cardiac: Continuous murmur present likely from the AV fistula on the left upper extremity Abdomen: Soft, nontender, no hepatomegaly, bowel sounds present, no guarding or rebound. Extremities: No pitting edema, distal pulses 2+. Skin: Warm and dry. Musculoskeletal: No kyphosis. Neuropsychiatric: Alert and oriented x3, affect grossly appropriate.  ECG: Normal sinus rhythm and no ST-T changes  Recent Labwork: No results found for requested labs within last 365 days.  No results found for: "CHOL", "TRIG", "HDL", "CHOLHDL", "VLDL", "LDLCALC", "LDLDIRECT"  Other Studies Reviewed Today: I personally reviewed the medical records provided by the patient Echo from 2021 LVEF normal Mild MR  Assessment and Plan: Patient is 71 year old F known to have ESRD DD, schizoaffective bipolar disorder was referred to cardiology clinic for evaluation of murmur.  # Murmur -Patient has continuous murmur present on the  left precordium likely secondary to fistula present on the left upper extremity. Nonetheless, she has mild MR per 2021 echo. I will repeat echocardiogram to monitor MR.  I have spent a total of 31 minutes with patient reviewing chart, EKGs, labs and examining patient as well as establishing an assessment and plan that was discussed with the patient.  > 50% of time was spent in direct patient care.     Medication Adjustments/Labs and Tests Ordered: Current medicines are reviewed at length with the patient today.  Concerns regarding medicines are outlined above.   Tests Ordered: Orders Placed This Encounter  Procedures   EKG 12-Lead    Medication Changes: No orders of the defined types were placed in this encounter.   Disposition:  Follow up prn  Signed, Kristinia Leavy Fidel Levy, MD, 09/20/2022 10:51 AM  [place] at [facility] [address], [place], [place] [address]

## 2022-09-27 ENCOUNTER — Ambulatory Visit (HOSPITAL_COMMUNITY): Admission: RE | Admit: 2022-09-27 | Payer: Medicare Other | Source: Ambulatory Visit

## 2022-10-04 ENCOUNTER — Ambulatory Visit (HOSPITAL_COMMUNITY)
Admission: RE | Admit: 2022-10-04 | Discharge: 2022-10-04 | Disposition: A | Discharge status: Home / Self Care | Payer: Medicare Other | Source: Ambulatory Visit | Attending: Internal Medicine | Admitting: Internal Medicine

## 2022-10-04 DIAGNOSIS — R011 Cardiac murmur, unspecified: Secondary | ICD-10-CM | POA: Diagnosis present

## 2022-10-04 LAB — ECHOCARDIOGRAM COMPLETE
Area-P 1/2: 2.6 cm2
S' Lateral: 2.1 cm

## 2022-10-04 NOTE — Progress Notes (Signed)
*  PRELIMINARY RESULTS* Echocardiogram 2D Echocardiogram has been performed.  Madison [name] 10/04/2022, 2:05 PM

## 2022-10-05 ENCOUNTER — Telehealth: Payer: Self-pay

## 2022-10-05 NOTE — Telephone Encounter (Signed)
-----  Message from Chalmers Guest, MD sent at 10/05/2022  6:43 AM EST ----- Normal Echo

## 2022-10-05 NOTE — Telephone Encounter (Signed)
Spoke to pt's nurse at [facility], [place], who verbalized understanding.

## 2022-10-24 ENCOUNTER — Emergency Department
Admission: EM | Admit: 2022-10-24 | Discharge: 2022-10-30 | Disposition: A | Discharge status: Skilled Nursing Facility | Payer: Medicare Other | Attending: Emergency Medicine | Admitting: Emergency Medicine

## 2022-10-24 ENCOUNTER — Other Ambulatory Visit: Payer: Self-pay

## 2022-10-24 DIAGNOSIS — N186 End stage renal disease: Secondary | ICD-10-CM | POA: Diagnosis not present

## 2022-10-24 DIAGNOSIS — R451 Restlessness and agitation: Secondary | ICD-10-CM | POA: Diagnosis not present

## 2022-10-24 DIAGNOSIS — R7309 Other abnormal glucose: Secondary | ICD-10-CM | POA: Insufficient documentation

## 2022-10-24 DIAGNOSIS — M79605 Pain in left leg: Secondary | ICD-10-CM | POA: Insufficient documentation

## 2022-10-24 DIAGNOSIS — R0600 Dyspnea, unspecified: Secondary | ICD-10-CM | POA: Diagnosis not present

## 2022-10-24 DIAGNOSIS — D631 Anemia in chronic kidney disease: Secondary | ICD-10-CM | POA: Insufficient documentation

## 2022-10-24 DIAGNOSIS — R410 Disorientation, unspecified: Secondary | ICD-10-CM | POA: Diagnosis not present

## 2022-10-24 DIAGNOSIS — Z992 Dependence on renal dialysis: Secondary | ICD-10-CM | POA: Insufficient documentation

## 2022-10-24 DIAGNOSIS — Z1152 Encounter for screening for COVID-19: Secondary | ICD-10-CM | POA: Diagnosis not present

## 2022-10-24 DIAGNOSIS — F250 Schizoaffective disorder, bipolar type: Secondary | ICD-10-CM | POA: Diagnosis not present

## 2022-10-24 DIAGNOSIS — R058 Other specified cough: Secondary | ICD-10-CM | POA: Diagnosis not present

## 2022-10-24 DIAGNOSIS — F989 Unspecified behavioral and emotional disorders with onset usually occurring in childhood and adolescence: Secondary | ICD-10-CM | POA: Insufficient documentation

## 2022-10-24 DIAGNOSIS — R4182 Altered mental status, unspecified: Secondary | ICD-10-CM | POA: Diagnosis present

## 2022-10-24 LAB — RESP PANEL BY RT-PCR (RSV, FLU A&B, COVID)  RVPGX2
Influenza A by PCR: NEGATIVE
Influenza B by PCR: NEGATIVE
Resp Syncytial Virus by PCR: NEGATIVE
SARS Coronavirus 2 by RT PCR: NEGATIVE

## 2022-10-24 LAB — COMPREHENSIVE METABOLIC PANEL
ALT: 10 U/L (ref 0–44)
AST: 16 U/L (ref 15–41)
Albumin: 3.6 g/dL (ref 3.5–5.0)
Alkaline Phosphatase: 75 U/L (ref 38–126)
Anion gap: 15 (ref 5–15)
BUN: 64 mg/dL — ABNORMAL HIGH (ref 8–23)
CO2: 28 mmol/L (ref 22–32)
Calcium: 9.6 mg/dL (ref 8.9–10.3)
Chloride: 96 mmol/L — ABNORMAL LOW (ref 98–111)
Creatinine, Ser: 8.69 mg/dL — ABNORMAL HIGH (ref 0.44–1.00)
GFR, Estimated: 4 mL/min — ABNORMAL LOW (ref >60)
Glucose, Bld: 111 mg/dL — ABNORMAL HIGH (ref 70–99)
Potassium: 5.2 mmol/L — ABNORMAL HIGH (ref 3.5–5.1)
Sodium: 139 mmol/L (ref 135–145)
Total Bilirubin: 0.7 mg/dL (ref 0.3–1.2)
Total Protein: 7.2 g/dL (ref 6.5–8.1)

## 2022-10-24 LAB — CBC
HCT: 32.4 % — ABNORMAL LOW (ref 36.0–46.0)
Hemoglobin: 10.3 g/dL — ABNORMAL LOW (ref 12.0–15.0)
MCH: 31.1 pg (ref 26.0–34.0)
MCHC: 31.8 g/dL (ref 30.0–36.0)
MCV: 97.9 fL (ref 80.0–100.0)
Platelets: 194 10*3/uL (ref 150–400)
RBC: 3.31 MIL/uL — ABNORMAL LOW (ref 3.87–5.11)
RDW: 15.6 % — ABNORMAL HIGH (ref 11.5–15.5)
WBC: 5.8 10*3/uL (ref 4.0–10.5)
nRBC: 0 % (ref 0.0–0.2)

## 2022-10-24 LAB — SALICYLATE LEVEL: Salicylate Lvl: <7 mg/dL — ABNORMAL LOW (ref 7.0–30.0)

## 2022-10-24 LAB — ETHANOL: Alcohol, Ethyl (B): <10 mg/dL (ref <10)

## 2022-10-24 LAB — ACETAMINOPHEN LEVEL: Acetaminophen (Tylenol), Serum: <10 ug/mL — ABNORMAL LOW (ref 10–30)

## 2022-10-24 NOTE — Consult Note (Signed)
[facility] Face-to-Face Psychiatry Consult   Reason for Consult:  Acute Psychosis Referring Physician:  Dr. Jori Moll Patient Identification: Madison [name] MRN:  [id number] Principal Diagnosis: Schizoaffective disorder, bipolar type ([place]) Diagnosis:  Principal Problem:   Schizoaffective disorder, bipolar type ([place]) Active Problems:   Altered mental status   ESRD on hemodialysis ([place])   Total Time spent with patient: 1 hour  Subjective:   Madison [name] is a 72 y.o. female patient admitted with agitation and noncompliance of psychiatric medications.  On evaluation, the patient is observed lying in the bed with sister, Madison [name] at the bedside. Patient is rambling, disorganized, restless and unable to answer assessment questions appropriately at this time. She appears to be responding to internal stimuli.   Collateral obtained from sister, Madison [name]: Madison [name] reports patient has been "manic" and has schizophrenia. Madison [name] reports "her schizophrenia flares up from time to time and "it's in the flare up phase." She reports the patient has taken medication for schizophrenia since the age of 71. Madison [name] reports the patient's psych medications are currently being managed by Huey Romans with [facility]. She reports the patient has not taken her psych medications in three months and has been refusing dialysis. Madison [name] reports the patient did go to dialysis this past Friday, accompanied by her sister, Madison [name]. Madison [name] reports the staff at the dialysis center reported she was increasingly restless and pulling at her tubing. Madison [name] reports the patient will most likely require a higher level of care at discharge.   Recommend psychiatric Inpatient admission when medically cleared. Reviewed with Dr. Charna Archer. Psych stabilization may occur before placement is found. TOC is involved in patient's care.   HPI:  Per Dr. Jori Moll, Madison [name] is a 72 y.o. female past medical history significant for schizophrenia, ESRD on HD  MWF, lives at the [facility] who presents to the emergency department altered mental status and confusion.  History is provided by the patient's sister at bedside.  States that she has not been taking her psychiatric medications over the past 3 months.  Over the past week has been refusing to go to dialysis.  Did not go to dialysis on Friday or Monday but was able to go to dialysis yesterday.  Today she was supposed to get another session of dialysis however patient was refusing.  More confused not acting her normal self.  Denies any suicidal or homicidal ideation.  Only complaints is pain to her legs and back.   Patient is unable to provide a cohesive history.  She is unable to state why she is here.  Just states that it hurts to get dialysis.  Past Psychiatric History: Sister reported patient has had a diagnosis of schizophrenia since the age of 51.  Risk to Self:   Risk to Others:   Prior Inpatient Therapy:   Prior Outpatient Therapy:    Past Medical History:  Past Medical History:  Diagnosis Date   Anxiety    ESRD (end stage renal disease) on dialysis ([place])    Neuropathy    Schizophrenia ([place])     Past Surgical History:  Procedure Laterality Date   CHOLECYSTECTOMY     COLONOSCOPY N/A 01/20/2016   Dr. Oneida Alar: 12 mm tubular adenoma removed from the transverse colon, 4 hyperplastic polyps removed from the rectum and sigmoid colon.  Next colonoscopy planned for April 2020.   ESOPHAGOGASTRODUODENOSCOPY N/A 12/19/2017   Procedure: ESOPHAGOGASTRODUODENOSCOPY (EGD);  Surgeon: [name], MD;  [facility];  Service: Endoscopy;  Laterality: N/A;  8:30am   fistula left arm     GIVENS CAPSULE STUDY N/A 01/14/2018   Procedure: GIVENS CAPSULE STUDY;  Surgeon: [name], MD;  [facility];  Service: Endoscopy;  Laterality: N/A;  7:30am   HEMICOLECTOMY Right    TUBAL LIGATION     Family History:  Family History  Problem Relation Age of Onset   Alzheimer's  disease Mother    Cancer Mother        pancreatic   Diabetes Mother    Prostate cancer Father    Kidney failure Sister    Breast cancer Neg Hx    Colon cancer Neg Hx    Family Psychiatric  History: None reported.  Social History:  Social History   Substance and Sexual Activity  Alcohol Use No     Social History   Substance and Sexual Activity  Drug Use No    Social History   Socioeconomic History   Marital status: Single    Spouse name: Not on file   Number of children: Not on file   Years of education: Not on file   Highest education level: Not on file  Occupational History   Not on file  Tobacco Use   Smoking status: Never   Smokeless tobacco: Never  Vaping Use   Vaping Use: Never used  Substance and Sexual Activity   Alcohol use: No   Drug use: No   Sexual activity: Not Currently    Birth control/protection: Surgical    Comment: hyst/tubal  Other Topics Concern   Not on file  Social History Narrative   Not on file   Social Determinants of Health   Financial Resource Strain: Not on file  Food Insecurity: Not on file  Transportation Needs: Not on file  Physical Activity: Not on file  Stress: Not on file  Social Connections: Not on file   Additional Social History:    Allergies:  No Known Allergies  Labs:  Results for orders placed or performed during the hospital encounter of 10/24/22 (from the past 48 hour(s))  Comprehensive metabolic panel     Status: Abnormal   Collection Time: 10/24/22 11:39 AM  Result Value Ref Range   Sodium 139 135 - 145 mmol/L   Potassium 5.2 (H) 3.5 - 5.1 mmol/L   Chloride 96 (L) 98 - 111 mmol/L   CO2 28 22 - 32 mmol/L   Glucose, Bld 111 (H) 70 - 99 mg/dL    Comment: Glucose reference range applies only to samples taken after fasting for at least 8 hours.   BUN 64 (H) 8 - 23 mg/dL   Creatinine, Ser 8.69 (H) 0.44 - 1.00 mg/dL   Calcium 9.6 8.9 - 10.3 mg/dL   Total Protein 7.2 6.5 - 8.1 g/dL   Albumin 3.6 3.5 - 5.0  g/dL   AST 16 15 - 41 U/L   ALT 10 0 - 44 U/L   Alkaline Phosphatase 75 38 - 126 U/L   Total Bilirubin 0.7 0.3 - 1.2 mg/dL   GFR, Estimated 4 (L) >60 mL/min    Comment: (NOTE) Calculated using the CKD-EPI Creatinine Equation (2021)    Anion gap 15 5 - 15    Comment: Performed at [facility], [address] [place], [place] [address]  Ethanol     Status: None   Collection Time: 10/24/22 11:39 AM  Result Value Ref Range   Alcohol, Ethyl (B) <10 <10 mg/dL    Comment: (NOTE) Lowest detectable  limit for serum alcohol is 10 mg/dL.  For medical purposes only. Performed at [facility], [address] [place], [place] [address]   Salicylate level     Status: Abnormal   Collection Time: 10/24/22 11:39 AM  Result Value Ref Range   Salicylate Lvl <9.2 (L) 7.0 - 30.0 mg/dL    Comment: Performed at [facility], [address] [place], [place] [address]  Acetaminophen level     Status: Abnormal   Collection Time: 10/24/22 11:39 AM  Result Value Ref Range   Acetaminophen (Tylenol), Serum <10 (L) 10 - 30 ug/mL    Comment: (NOTE) Therapeutic concentrations vary significantly. A range of 10-30 ug/mL  may be an effective concentration for many patients. However, some  are best treated at concentrations outside of this range. Acetaminophen concentrations >150 ug/mL at 4 hours after ingestion  and >50 ug/mL at 12 hours after ingestion are often associated with  toxic reactions.  Performed at [facility], [address] [place], [place] [address]   cbc     Status: Abnormal   Collection Time: 10/24/22 11:39 AM  Result Value Ref Range   WBC 5.8 4.0 - 10.5 K/uL   RBC 3.31 (L) 3.87 - 5.11 MIL/uL   Hemoglobin 10.3 (L) 12.0 - 15.0 g/dL   HCT 32.4 (L) 36.0 - 46.0 %   MCV 97.9 80.0 - 100.0 fL   MCH 31.1 26.0 - 34.0 pg   MCHC 31.8 30.0 - 36.0 g/dL   RDW 15.6 (H) 11.5 - 15.5 %   Platelets 194 150 - 400 K/uL   nRBC 0.0 0.0 - 0.2 %    Comment:  Performed at [facility], [address] [place], [place] [address]  Resp panel by RT-PCR (RSV, Flu A&B, Covid) Anterior Nasal Swab     Status: None   Collection Time: 10/24/22  3:04 PM   Specimen: Anterior Nasal Swab  Result Value Ref Range   SARS Coronavirus 2 by RT PCR NEGATIVE NEGATIVE    Comment: (NOTE) SARS-CoV-2 target nucleic acids are NOT DETECTED.  The SARS-CoV-2 RNA is generally detectable in upper respiratory specimens during the acute phase of infection. The lowest concentration of SARS-CoV-2 viral copies this assay can detect is 138 copies/mL. A negative result does not preclude SARS-Cov-2 infection and should not be used as the sole basis for treatment or other patient management decisions. A negative result may occur with  improper specimen collection/handling, submission of specimen other than nasopharyngeal swab, presence of viral mutation(s) within the areas targeted by this assay, and inadequate number of viral copies(<138 copies/mL). A negative result must be combined with clinical observations, patient history, and epidemiological information. The expected result is Negative.  Fact Sheet for Patients:  EntrepreneurPulse.com.au  Fact Sheet for Healthcare Providers:  IncredibleEmployment.be  This test is no t yet approved or cleared by the [place] FDA and  has been authorized for detection and/or diagnosis of SARS-CoV-2 by FDA under an Emergency Use Authorization (EUA). This EUA will remain  in effect (meaning this test can be used) for the duration of the COVID-19 declaration under Section 564(b)(1) of the Act, 21 U.S.C.section 360bbb-3(b)(1), unless the authorization is terminated  or revoked sooner.       Influenza A by PCR NEGATIVE NEGATIVE   Influenza B by PCR NEGATIVE NEGATIVE    Comment: (NOTE) The Xpert Xpress SARS-CoV-2/FLU/RSV plus assay is intended as an aid in the diagnosis of influenza from  Nasopharyngeal swab specimens  and should not be used as a sole basis for treatment. Nasal washings and aspirates are unacceptable for Xpert Xpress SARS-CoV-2/FLU/RSV testing.  Fact Sheet for Patients: EntrepreneurPulse.com.au  Fact Sheet for Healthcare Providers: IncredibleEmployment.be  This test is not yet approved or cleared by the [place] FDA and has been authorized for detection and/or diagnosis of SARS-CoV-2 by FDA under an Emergency Use Authorization (EUA). This EUA will remain in effect (meaning this test can be used) for the duration of the COVID-19 declaration under Section 564(b)(1) of the Act, 21 U.S.C. section 360bbb-3(b)(1), unless the authorization is terminated or revoked.     Resp Syncytial Virus by PCR NEGATIVE NEGATIVE    Comment: (NOTE) Fact Sheet for Patients: EntrepreneurPulse.com.au  Fact Sheet for Healthcare Providers: IncredibleEmployment.be  This test is not yet approved or cleared by the [place] FDA and has been authorized for detection and/or diagnosis of SARS-CoV-2 by FDA under an Emergency Use Authorization (EUA). This EUA will remain in effect (meaning this test can be used) for the duration of the COVID-19 declaration under Section 564(b)(1) of the Act, 21 U.S.C. section 360bbb-3(b)(1), unless the authorization is terminated or revoked.  Performed at [facility], [address] [place], [place] [address]     No current facility-administered medications for this encounter.   Current Outpatient Medications  Medication Sig Dispense Refill   acetaminophen (TYLENOL) 325 MG tablet Take 650 mg by mouth every 6 (six) hours as needed.     ALPRAZolam (XANAX) 0.25 MG tablet Take 1 tablet (0.25 mg total) by mouth 3 (three) times daily as needed for anxiety. 30 tablet 0   aspirin EC 81 MG tablet Take 81 mg by mouth daily. Swallow whole.     B Complex-C-Folic  Acid (DIALYVITE TABLET) TABS Take 1 tablet by mouth daily.     benztropine (COGENTIN) 1 MG tablet Take 1 mg by mouth 2 (two) times daily.     docusate sodium (COLACE) 100 MG capsule Take 1 capsule (100 mg total) by mouth 2 (two) times daily. 10 capsule 0   DULoxetine (CYMBALTA) 20 MG capsule Take 1 capsule (20 mg total) by mouth daily.  3   epoetin alfa (EPOGEN,PROCRIT) 42353 UNIT/ML injection Inject 1 mL (10,000 Units total) into the vein every Monday, Wednesday, and Friday with hemodialysis. 1 mL 1   ferric citrate (AURYXIA) 1 GM 210 MG(Fe) tablet Take 210 mg by mouth 3 (three) times daily with meals.      gabapentin (NEURONTIN) 100 MG capsule Take 1 capsule (100 mg total) by mouth 2 (two) times daily. 60 capsule 1   loperamide (IMODIUM) 2 MG capsule Take by mouth as needed for diarrhea or loose stools.     LORazepam (ATIVAN) 0.5 MG tablet Take 0.5 mg by mouth every 8 (eight) hours.     Meloxicam 5 MG CAPS Take 1 capsule by mouth daily.     midodrine (PROAMATINE) 10 MG tablet Take 10 mg by mouth 3 (three) times daily.     pantoprazole (PROTONIX) 40 MG tablet TAKE 1 TABLET BY MOUTH ONCE DAILY. 90 tablet 3   risperiDONE (RISPERDAL M-TABS) 1 MG disintegrating tablet Take 1 mg by mouth daily.     senna (SENOKOT) 8.6 MG TABS tablet Take 1 tablet by mouth.     sodium zirconium cyclosilicate (LOKELMA) 10 g PACK packet Take 10 g by mouth.     thiamine (VITAMIN B-1) 100 MG tablet Take 0.5 tablets (50 mg total) by mouth daily. 30 tablet 0  valproic acid (DEPAKENE) 250 MG/5ML solution Take 250 mg by mouth 3 (three) times daily.      Musculoskeletal: Strength & Muscle Tone: decreased Gait & Station:  Did not observe Patient leans: N/A            Psychiatric Specialty Exam:  Presentation  General Appearance: Appropriate for Environment  Eye Contact:Fair  Speech:Other (comment) (Rambling)  Speech Volume:Normal  Handedness:Right   Mood and Affect  Mood:Anxious  (Restlessness)  Affect:Labile   Thought Process  Thought Processes:Disorganized  Descriptions of Associations:Loose  Orientation:Partial  Thought Content:Scattered  History of Schizophrenia/Schizoaffective disorder:No data recorded Duration of Psychotic Symptoms:No data recorded Hallucinations:Hallucinations: Other (comment) (No evidence of hallucinations)  Ideas of Reference:None (No evidence of hallucinations)  Suicidal Thoughts:Suicidal Thoughts: -- (Unable to assess)  Homicidal Thoughts:Homicidal Thoughts: -- (Unable to assess)   Sensorium  Memory:Immediate Poor; Recent Poor  Judgment:Impaired  Insight:Lacking   Executive Functions  Concentration:Poor  Attention Span:Poor  Recall:Poor  [employer] of Knowledge:Poor  Language:Poor   Psychomotor Activity  Psychomotor Activity:Psychomotor Activity: Restlessness   Assets  Assets:Housing; Catering manager; Social Support   Sleep  Sleep:Sleep: Fair   Physical Exam: Physical Exam HENT:     Head: Normocephalic.     Nose: Nose normal.  Pulmonary:     Effort: Pulmonary effort is normal.  Musculoskeletal:     Cervical back: Normal range of motion.     Comments: Generalized weakness  Skin:    General: Skin is dry.  Neurological:     Mental Status: She is alert. She is disoriented.    Review of Systems  Constitutional: Negative.   HENT: Negative.    Respiratory: Negative.    Musculoskeletal:  Positive for falls.  Psychiatric/Behavioral:  Positive for hallucinations and memory loss.        Restlessness    Blood pressure 131/77, pulse 94, temperature 97.9 F (36.6 C), resp. rate 18, SpO2 98 %. There is no height or weight on file to calculate BMI.  Treatment Plan Summary: Daily contact with patient to assess and evaluate symptoms and progress in treatment and Medication management  Disposition: Recommend psychiatric Inpatient admission when medically cleared. Reviewed with Dr. Charna Archer.    Ronny Flurry, NP 10/24/2022 4:57 PM

## 2022-10-24 NOTE — ED Notes (Signed)
ED Provider at bedside. 

## 2022-10-24 NOTE — ED Notes (Signed)
Family continuing to sit with patient

## 2022-10-24 NOTE — ED Notes (Signed)
VOL/pt is recommended for in-pt psych admit

## 2022-10-24 NOTE — ED Triage Notes (Signed)
Pt comes with c/o bilateral leg pain, not taking her psych meds and not going to dialysis like she should. Sister with pt and states they need her to get help. Pt stays [place]. Pt has been refusing her medications.   Pt states legs and back pain. Pt denies any SI or HI.  Pt stats she has been hurting so bad she can't take her meds. Pt talking about they won't let her take her bath and change her sheets. Pt is rambling at this time.

## 2022-10-24 NOTE — BH Assessment (Signed)
Comprehensive Clinical Assessment (CCA) Note  10/24/2022 Madison [name] [id number]  Madison [name], 72 year old female who presents to [facility] ED voluntarily for treatment. Per triage note, Pt comes with c/o bilateral leg pain, not taking her psych meds and not going to dialysis like she should. Sister with pt and states they need her to get help. Pt stays [facility]. Pt has been refusing her medications. Pt states legs and back pain. Pt denies any SI or HI. Pt states she has been hurting so bad she can't take her meds. Pt talking about they won't let her take her bath and change her sheets. Pt is rambling at this time.   During TTS assessment pt presents alert and disoriented x 4, restless but cooperative, and mood-congruent with affect. The pt appears to be responding to internal stimuli. Pt is presenting with delusional thinking. Pt verified the information provided to triage RN.   Pt presents to ED with agitation and noncompliance of medications. Patient is lying in bed with sister, Madison [name]  at bedside. Patient presents with disorganized speech and thought. Patient is rambling, restless and not able to answer questions appropriately. Madison [name] reports patient has past diagnosis of schizophrenia and for 3 months patient has been off her medication. Madison [name] states patient has also been refusing dialysis for the past week; however, she did go with patient yesterday. She reports patient refused dialysis again today. Madison [name] states patient is not her "normal" self and complains of leg and back pains.    Per Christal, NP, pt is recommended for inpatient psychiatric admission.    Chief Complaint:  Chief Complaint  Patient presents with   Leg Pain   behavior evaluation   Visit Diagnosis: Schizoaffective disorder, bipolar type    CCA Screening, Triage and Referral (STR)  Patient Reported Information How did you hear about [place]? Family/Friend  Referral name: No data recorded Referral phone number: No data  recorded  Whom do you see for routine medical problems? No data recorded Practice/Facility Name: No data recorded Practice/Facility Phone Number: No data recorded Name of Contact: No data recorded Contact Number: No data recorded Contact Fax Number: No data recorded Prescriber Name: No data recorded Prescriber Address (if known): No data recorded  What Is the Reason for Your Visit/Call Today? Patient brought to ED due to non compliance of meds and complaints of leg and back pain.  How Long Has This Been Causing You Problems? 1 wk - 1 month  What Do You Feel Would Help You the Most Today? Medication(s)   Have You Recently Been in Any Inpatient Treatment (Hospital/Detox/Crisis Center/28-Day Program)? No data recorded Name/Location of Program/Hospital:No data recorded How Long Were You There? No data recorded When Were You Discharged? No data recorded  Have You Ever Received Services From [facility] Before? No data recorded Who Do You See at [facility]? No data recorded  Have You Recently Had Any Thoughts About Hurting Yourself? No  Are You Planning to Commit Suicide/Harm Yourself At This time? No   Have you Recently Had Thoughts About [place]? No  Explanation: No data recorded  Have You Used Any Alcohol or Drugs in the Past 24 Hours? No  How Long Ago Did You Use Drugs or Alcohol? No data recorded What Did You Use and How Much? No data recorded  Do You Currently Have a Therapist/Psychiatrist? No data recorded Name of Therapist/Psychiatrist: No data recorded  Have You Been Recently Discharged From Any Office Practice or Programs? No  data recorded Explanation of Discharge From Practice/Program: No data recorded    CCA Screening Triage Referral Assessment Type of Contact: Face-to-Face  Is this Initial or Reassessment? No data recorded Date Telepsych consult ordered in CH[facility]:  No data recorded Time Telepsych consult ordered in CHL:  No data  recorded  Patient Reported Information Reviewed? No data recorded Patient Left Without Being Seen? No data recorded Reason for Not Completing Assessment: No data recorded  Collateral Involvement: Patient's sisters, Lowella Dandy and [name]   Does Patient Have a Lydia? No data recorded Name and Contact of Legal Guardian: No data recorded If Minor and Not Living with Parent(s), Who has Custody? No data recorded Is CPS involved or ever been involved? No data recorded Is APS involved or ever been involved? No data recorded  Patient Determined To Be At Risk for Harm To Self or Others Based on Review of Patient Reported Information or Presenting Complaint? No data recorded Method: No data recorded Availability of Means: No data recorded Intent: No data recorded Notification Required: No data recorded Additional Information for Danger to Others Potential: No data recorded Additional Comments for Danger to Others Potential: No data recorded Are There Guns or Other Weapons in Your Home? No  Types of Guns/Weapons: No data recorded Are These Weapons Safely Secured?                            No data recorded Who Could Verify You Are Able To Have These Secured: No data recorded Do You Have any Outstanding Charges, Pending Court Dates, Parole/Probation? No data recorded Contacted To Inform of Risk of Harm To Self or Others: No data recorded  Location of Assessment: [facility] ED   Does Patient Present under Involuntary Commitment? No  IVC Papers Initial File Date: No data recorded  [place] of Residence: [place]   Patient Currently Receiving the Following Services: [place]; Medication Management   Determination of Need: Emergent (2 hours)   Options For Referral: ED Visit; [place]; Inpatient Hospitalization; ALF/SNF     CCA Biopsychosocial Intake/Chief Complaint:  No data recorded Current Symptoms/Problems: No data  recorded  Patient Reported Schizophrenia/Schizoaffective Diagnosis in Past: Yes   Strengths: Patient is able to communicate.  Preferences: No data recorded Abilities: No data recorded  Type of Services Patient Feels are Needed: No data recorded  Initial Clinical Notes/Concerns: No data recorded  Mental Health Symptoms Depression:   None   Duration of Depressive symptoms: No data recorded  Mania:   None   Anxiety:   No data recorded  Psychosis:   Grossly disorganized or catatonic behavior; Grossly disorganized speech; Delusions   Duration of Psychotic symptoms:  Less than six months   Trauma:   None   Obsessions:   None   Compulsions:   None   Inattention:   Disorganized   Hyperactivity/Impulsivity:   None   Oppositional/Defiant Behaviors:   None   Emotional Irregularity:   None   Other Mood/Personality Symptoms:  No data recorded   Mental Status Exam Appearance and self-care  Stature:   Average   Weight:   Average weight   Clothing:   Casual   Grooming:   Normal   Cosmetic use:   None   Posture/gait:   Slumped   Motor activity:   Agitated   Sensorium  Attention:   Confused   Concentration:   Scattered; Focuses on irrelevancies   Orientation:  Object; Person; Place   Recall/memory:  No data recorded  Affect and Mood  Affect:  No data recorded  Mood:   Anxious   Relating  Eye contact:   Normal   Facial expression:   Anxious   Attitude toward examiner:   Cooperative   Thought and Language  Speech flow:  Blocked; Soft   Thought content:   Appropriate to Mood and Circumstances   Preoccupation:   None   Hallucinations:   Auditory   Organization:  No data recorded  [employer] of Knowledge:   Fair   Intelligence:   Average   Abstraction:   Overly abstract   Judgement:   Impaired   Reality Testing:   Distorted   Insight:  No data recorded  Decision Making:   Confused   Social  Functioning  Social Maturity:   Irresponsible   Social Judgement:   Heedless   Stress  Stressors:   Illness   Coping Ability:   Exhausted   Skill Deficits:   Decision making   Supports:   Family     Religion:    Leisure/Recreation:    Exercise/Diet:     CCA Employment/Education Employment/Work Situation: Employment / Work Situation Employment Situation: On disability  Education:     CCA Family/Childhood History Family and Relationship History:    Childhood History:     Child/Adolescent Assessment:     CCA Substance Use Alcohol/Drug Use: Alcohol / Drug Use Pain Medications: see MAR Prescriptions: see MAR Over the Counter: see MAR History of alcohol / drug use?: No history of alcohol / drug abuse                         ASAM's:  Six Dimensions of Multidimensional Assessment  Dimension 1:  Acute Intoxication and/or Withdrawal Potential:      Dimension 2:  Biomedical Conditions and Complications:      Dimension 3:  Emotional, Behavioral, or Cognitive Conditions and Complications:     Dimension 4:  Readiness to Change:     Dimension 5:  Relapse, Continued use, or Continued Problem Potential:     Dimension 6:  Recovery/Living Environment:     ASAM Severity Score:    ASAM Recommended Level of Treatment:     Substance use Disorder (SUD)    Recommendations for Services/Supports/Treatments:    DSM5 Diagnoses: Patient Active Problem List   Diagnosis Date Noted   Murmur, cardiac 09/20/2022   Encounter for screening fecal occult blood testing 10/31/2021   Encounter for gynecological examination 50/93/2671   Acute metabolic encephalopathy 24/58/0998   Rhabdomyolysis 04/06/2020   Elevated troponin 04/06/2020   Pneumonia 04/06/2020   Lactic acidosis 04/06/2020   Suspected Sepsis ([place]) 04/06/2020   Possible NSTEMI (non-ST elevated myocardial infarction) ([place]) 04/06/2020    Suspected Overdose, undetermined intent, initial encounter  04/06/2020   Schizoaffective disorder, bipolar type ([place])    Bipolar affective disorder, current episode manic with psychotic symptoms ([place]) 12/25/2018   ESRD on hemodialysis ([place]) 12/25/2018   History of adenomatous polyp of colon 11/20/2018   Loss of weight 10/17/2017   Hypernatremia 04/17/2016   CKD (chronic kidney disease) stage 3, GFR 30-59 ml/min ([place]) 04/16/2016   Normocytic anemia 04/16/2016   Meningioma ([place]) 04/16/2016   Acute psychosis ([place]) 04/15/2016   Hyponatremia 04/15/2016   AKI (acute kidney injury) ([place]) 04/15/2016   Special screening for malignant neoplasms, colon    Thrombocytopenia ([place]) 01/05/2016   Altered mental status  04/19/2015    Patient Centered Plan: Patient is on the following Treatment Plan(s):     Referrals to Alternative Service(s): Referred to Alternative Service(s):   Place:   Date:   Time:    Referred to Alternative Service(s):   Place:   Date:   Time:    Referred to Alternative Service(s):   Place:   Date:   Time:    Referred to Alternative Service(s):   Place:   Date:   Time:      '@BHCOLLABOFCARE'$ @  [place], Counselor, LCAS-A

## 2022-10-24 NOTE — ED Notes (Signed)
Sister sitting with patient at bedside. Pt rambling while RN was at bedside

## 2022-10-24 NOTE — ED Notes (Signed)
Family continuing to sit with patient. Pt resting comfortably

## 2022-10-24 NOTE — ED Provider Notes (Signed)
[facility] Provider Note    Event Date/Time   First MD Initiated Contact with Patient 10/24/22 1425     (approximate)   History   Leg Pain and behavior evaluation   HPI  Madison [name] is a 72 y.o. female past medical history significant for schizophrenia, ESRD on HD MWF, lives at the [place] who presents to the emergency department altered mental status and confusion.  History is provided by the patient's sister at bedside.  States that she has not been taking her psychiatric medications over the past 3 months.  Over the past week has been refusing to go to dialysis.  Did not go to dialysis on Friday or Monday but was able to go to dialysis yesterday.  Today she was supposed to get another session of dialysis however patient was refusing.  More confused not acting her normal self.  Denies any suicidal or homicidal ideation.  Only complaints is pain to her legs and back.  Patient is unable to provide a cohesive history.  She is unable to state why she is here.  Just states that it hurts to get dialysis.     Physical Exam   Triage Vital Signs: ED Triage Vitals  Enc Vitals Group     BP 10/24/22 1223 131/77     Pulse Rate 10/24/22 1223 94     Resp 10/24/22 1223 18     Temp 10/24/22 1223 97.9 F (36.6 C)     Temp src --      SpO2 10/24/22 1223 98 %     Weight --      Height --      Head Circumference --      Peak Flow --      Pain Score 10/24/22 1222 10     Pain Loc --      Pain Edu? --      Excl. in House? --     Most recent vital signs: Vitals:   10/24/22 1223  BP: 131/77  Pulse: 94  Resp: 18  Temp: 97.9 F (36.6 C)  SpO2: 98%    Physical Exam Constitutional:      Appearance: She is well-developed.  HENT:     Head: Atraumatic.  Eyes:     Conjunctiva/sclera: Conjunctivae normal.  Cardiovascular:     Rate and Rhythm: Regular rhythm.  Pulmonary:     Effort: No respiratory distress.  Abdominal:     General: There is no  distension.  Musculoskeletal:        General: Normal range of motion.     Cervical back: Normal range of motion.  Skin:    General: Skin is warm.     Comments: Left AV fistula with a palpable thrill  Neurological:     Mental Status: She is alert. Mental status is at baseline.     IMPRESSION / MDM / ASSESSMENT AND PLAN / ED COURSE  I reviewed the triage vital signs and the nursing notes.  Acute psychosis secondary to medication noncompliance, electrolyte abnormality, elevated BUN, dehydration  LABS (all labs ordered are listed, but only abnormal results are displayed) Labs interpreted as -   Does not meet criteria for emergent dialysis Labs Reviewed  COMPREHENSIVE METABOLIC PANEL - Abnormal; Notable for the following components:      Result Value   Potassium 5.2 (*)    Chloride 96 (*)    Glucose, Bld 111 (*)    BUN 64 (*)  Creatinine, Ser 8.69 (*)    GFR, Estimated 4 (*)    All other components within normal limits  SALICYLATE LEVEL - Abnormal; Notable for the following components:   Salicylate Lvl <1.6 (*)    All other components within normal limits  ACETAMINOPHEN LEVEL - Abnormal; Notable for the following components:   Acetaminophen (Tylenol), Serum <10 (*)    All other components within normal limits  CBC - Abnormal; Notable for the following components:   RBC 3.31 (*)    Hemoglobin 10.3 (*)    HCT 32.4 (*)    RDW 15.6 (*)    All other components within normal limits  RESP PANEL BY RT-PCR (RSV, FLU A&B, COVID)  [id number]  ETHANOL  URINE DRUG SCREEN, QUALITATIVE ([facility] ONLY)    MDM  Another provider placed a message to the nephrologist to inform the patient would need dialysis.  Psychiatry and social work were consulted.     The patient has been placed in psychiatric observation due to the need to provide a safe environment for the patient while obtaining psychiatric consultation and evaluation, as well as ongoing medical and medication management to treat the  patient's condition.  The patient has not been placed under full IVC at this time.   PROCEDURES:  Critical Care performed: No  Procedures  Patient's presentation is most consistent with acute presentation with potential threat to life or bodily function.   MEDICATIONS ORDERED IN ED: Medications - No data to display  FINAL CLINICAL IMPRESSION(S) / ED DIAGNOSES   Final diagnoses:  Agitated  Confused     Rx / DC Orders   ED Discharge Orders     None        Note:  This document was prepared using Dragon voice recognition software and may include unintentional dictation errors.   Nathaniel Man, MD 10/24/22 [phone]

## 2022-10-24 NOTE — ED Notes (Signed)
Family continuing to sit with patient. Patient given warm blanket.

## 2022-10-25 DIAGNOSIS — F250 Schizoaffective disorder, bipolar type: Secondary | ICD-10-CM | POA: Diagnosis not present

## 2022-10-25 LAB — RENAL FUNCTION PANEL
Albumin: 2.9 g/dL — ABNORMAL LOW (ref 3.5–5.0)
Anion gap: 15 (ref 5–15)
BUN: 77 mg/dL — ABNORMAL HIGH (ref 8–23)
CO2: 27 mmol/L (ref 22–32)
Calcium: 9.3 mg/dL (ref 8.9–10.3)
Chloride: 97 mmol/L — ABNORMAL LOW (ref 98–111)
Creatinine, Ser: 10.35 mg/dL — ABNORMAL HIGH (ref 0.44–1.00)
GFR, Estimated: 4 mL/min — ABNORMAL LOW (ref >60)
Glucose, Bld: 134 mg/dL — ABNORMAL HIGH (ref 70–99)
Phosphorus: 8 mg/dL — ABNORMAL HIGH (ref 2.5–4.6)
Potassium: 6.1 mmol/L — ABNORMAL HIGH (ref 3.5–5.1)
Sodium: 139 mmol/L (ref 135–145)

## 2022-10-25 LAB — CBC
HCT: 26.9 % — ABNORMAL LOW (ref 36.0–46.0)
Hemoglobin: 8.8 g/dL — ABNORMAL LOW (ref 12.0–15.0)
MCH: 32 pg (ref 26.0–34.0)
MCHC: 32.7 g/dL (ref 30.0–36.0)
MCV: 97.8 fL (ref 80.0–100.0)
Platelets: 166 10*3/uL (ref 150–400)
RBC: 2.75 MIL/uL — ABNORMAL LOW (ref 3.87–5.11)
RDW: 15.6 % — ABNORMAL HIGH (ref 11.5–15.5)
WBC: 5.2 10*3/uL (ref 4.0–10.5)
nRBC: 0 % (ref 0.0–0.2)

## 2022-10-25 LAB — CBG MONITORING, ED: Glucose-Capillary: 141 mg/dL — ABNORMAL HIGH (ref 70–99)

## 2022-10-25 LAB — HEPATITIS B SURFACE ANTIGEN: Hepatitis B Surface Ag: NONREACTIVE

## 2022-10-25 MED ORDER — SODIUM ZIRCONIUM CYCLOSILICATE 10 G PO PACK
10.0000 g | PACK | Freq: Every day | ORAL | Status: DC
  Administered 2022-10-26 – 2022-10-30 (×5): 10 g via ORAL
  Filled 2022-10-25 (×6): qty 1

## 2022-10-25 MED ORDER — PENTAFLUOROPROP-TETRAFLUOROETH EX AERO: 1.0000 | INHALATION_SPRAY | CUTANEOUS | Status: DC | PRN

## 2022-10-25 MED ORDER — EPOETIN ALFA 4000 UNIT/ML IJ SOLN
INTRAMUSCULAR | Status: AC
  Administered 2022-10-25: 4000 [IU] via INTRAVENOUS
  Filled 2022-10-25: qty 1

## 2022-10-25 MED ORDER — MELOXICAM 7.5 MG PO TABS
7.5000 mg | ORAL_TABLET | Freq: Every day | ORAL | Status: DC
  Administered 2022-10-26 – 2022-10-27 (×2): 7.5 mg via ORAL
  Filled 2022-10-25 (×3): qty 1

## 2022-10-25 MED ORDER — LIDOCAINE-PRILOCAINE 2.5-2.5 % EX CREA: 1.0000 | TOPICAL_CREAM | CUTANEOUS | Status: DC | PRN

## 2022-10-25 MED ORDER — ARIPIPRAZOLE ER 400 MG IM SRER
300.0000 mg | INTRAMUSCULAR | Status: DC
  Filled 2022-10-25: qty 2

## 2022-10-25 MED ORDER — BENZTROPINE MESYLATE 1 MG PO TABS
1.0000 mg | ORAL_TABLET | Freq: Two times a day (BID) | ORAL | Status: DC
  Administered 2022-10-25 – 2022-10-27 (×3): 1 mg via ORAL
  Filled 2022-10-25 (×5): qty 1

## 2022-10-25 MED ORDER — EPOETIN ALFA 10000 UNIT/ML IJ SOLN
10000.0000 [IU] | INTRAMUSCULAR | Status: DC
  Filled 2022-10-25: qty 1

## 2022-10-25 MED ORDER — HEPARIN SODIUM (PORCINE) 1000 UNIT/ML DIALYSIS: 1000.0000 [IU] | INTRAMUSCULAR | Status: DC | PRN

## 2022-10-25 MED ORDER — RISPERIDONE 1 MG/ML PO SOLN
1.0000 mg | Freq: Three times a day (TID) | ORAL | Status: DC
  Administered 2022-10-25: 1 mg via ORAL
  Filled 2022-10-25 (×3): qty 1

## 2022-10-25 MED ORDER — PANTOPRAZOLE SODIUM 40 MG PO TBEC
40.0000 mg | DELAYED_RELEASE_TABLET | Freq: Every day | ORAL | Status: DC
  Administered 2022-10-26 – 2022-10-30 (×5): 40 mg via ORAL
  Filled 2022-10-25 (×6): qty 1

## 2022-10-25 MED ORDER — ACETAMINOPHEN 325 MG PO TABS
650.0000 mg | ORAL_TABLET | Freq: Three times a day (TID) | ORAL | Status: DC
  Administered 2022-10-25 – 2022-10-30 (×9): 650 mg via ORAL
  Filled 2022-10-25 (×12): qty 2

## 2022-10-25 MED ORDER — VALPROIC ACID 250 MG/5ML PO SOLN
250.0000 mg | Freq: Three times a day (TID) | ORAL | Status: DC
  Administered 2022-10-26: 250 mg via ORAL
  Filled 2022-10-25 (×3): qty 5

## 2022-10-25 MED ORDER — EPOETIN ALFA 4000 UNIT/ML IJ SOLN
4000.0000 [IU] | Freq: Once | INTRAMUSCULAR | Status: AC
  Filled 2022-10-25: qty 1

## 2022-10-25 MED ORDER — CHLORHEXIDINE GLUCONATE CLOTH 2 % EX PADS
6.0000 | MEDICATED_PAD | Freq: Every day | CUTANEOUS | Status: DC
  Administered 2022-10-26: 6 via TOPICAL
  Filled 2022-10-25 (×6): qty 6

## 2022-10-25 MED ORDER — ARIPIPRAZOLE 5 MG PO TABS
5.0000 mg | ORAL_TABLET | Freq: Every day | ORAL | Status: DC
  Administered 2022-10-26 – 2022-10-27 (×2): 5 mg via ORAL
  Filled 2022-10-25 (×3): qty 1

## 2022-10-25 MED ORDER — ALTEPLASE 2 MG IJ SOLR: 2.0000 mg | Freq: Once | INTRAMUSCULAR | Status: DC | PRN

## 2022-10-25 MED ORDER — HALOPERIDOL LACTATE 5 MG/ML IJ SOLN
5.0000 mg | Freq: Once | INTRAMUSCULAR | Status: AC
  Administered 2022-10-25: 5 mg via INTRAMUSCULAR
  Filled 2022-10-25: qty 1

## 2022-10-25 MED ORDER — LIDOCAINE HCL (PF) 1 % IJ SOLN: 5.0000 mL | INTRAMUSCULAR | Status: DC | PRN

## 2022-10-25 MED ORDER — MIDODRINE HCL 5 MG PO TABS: 15.0000 mg | ORAL_TABLET | ORAL | Status: DC

## 2022-10-25 NOTE — ED Notes (Signed)
Vol / recommend psych inpatient admission when medically cleared

## 2022-10-25 NOTE — ED Notes (Signed)
Pt given dinner tray and drink

## 2022-10-25 NOTE — Consult Note (Signed)
On assessment today, the patient is seen lying in bed with sister, [name] at the bedside. The patient continues to exhibit confusion and disorganization. Patient reports she is "upset" about a situation that took place earlier this afternoon regarding having to use the bathroom. The patient does not respond to assessment questions, but continues to ramble about being upset. [name] reports the patient has been agitated and restless this afternoon. Psychotropic medications have been reordered.    Correll Denbow H. Richardson Landry, NP

## 2022-10-25 NOTE — ED Notes (Signed)
Pt two-person assist into wheelchair and taken to the bathroom, pt two-person assist to toilet and pt states "I'm constipated". Pt sat on toilet for a few minutes without voiding or a BM. Pt continues to state that she is constipated. This Primary school teacher, Ysidro Evert. Pt assisted back into wheelchair and back into bed.

## 2022-10-25 NOTE — ED Notes (Signed)
Pt going to Dialysis with transport

## 2022-10-25 NOTE — ED Provider Notes (Signed)
Patient becoming increase with a agitated refusing to take her medications.  She is refusing her respite all.  Unable to be redirected.  Will order IM Haldol closely monitor.   Merlyn Lot, MD 10/25/22 2024

## 2022-10-25 NOTE — ED Provider Notes (Signed)
Emergency Medicine Observation Re-evaluation Note  Madison [name] is a 72 y.o. female, seen on rounds today.  Pt initially presented to the ED for complaints of Leg Pain and behavior evaluation Currently, the patient is resting, voices no medical complaints.  Physical Exam  BP 126/69   Pulse 84   Temp 98.2 F (36.8 C)   Resp 16   SpO2 95%  Physical Exam General: Resting in no acute distress Cardiac: No cyanosis Lungs: Equal rise and fall Psych: Not agitated  ED Course / MDM  EKG:   I have reviewed the labs performed to date as well as medications administered while in observation.  Recent changes in the last 24 hours include no events overnight.  Plan  Current plan is for psychiatric disposition.    Paulette Blanch, MD 10/25/22 [phone]

## 2022-10-25 NOTE — ED Notes (Signed)
Pt up to bathroom with sister. This RN assisted in getting pt on the toilet

## 2022-10-25 NOTE — ED Notes (Addendum)
This RN attempted to give pt her PO medications. Pt would not take them with water. RN attempted in applesauce and pt spit it out. Patients sister is at bedside and aware. Medications had to be thrown away due to them open

## 2022-10-25 NOTE — TOC Initial Note (Signed)
Transition of Care [facility]) - Initial/Assessment Note    Patient Details  Name: Madison [name] MRN: [id number] Date of Birth: 10/17/1950  [facility]) CM/SW Contact:    Shelbie Hutching, RN Phone Number: 10/25/2022, 9:18 AM  Clinical Narrative:                  [facility] consult acknowledged.  Current recommendation is for inpatient psychiatric admission.   TOC signed off, please re-consult if other needs arise.        Patient Goals and CMS Choice            Expected Discharge Plan and Services                                              Prior Living Arrangements/Services                       Activities of Daily Living      Permission Sought/Granted                  Emotional Assessment              Admission diagnosis:  beh med eval Patient Active Problem List   Diagnosis Date Noted   Murmur, cardiac 09/20/2022   Encounter for screening fecal occult blood testing 10/31/2021   Encounter for gynecological examination 92/33/0076   Acute metabolic encephalopathy 22/63/3354   Rhabdomyolysis 04/06/2020   Elevated troponin 04/06/2020   Pneumonia 04/06/2020   Lactic acidosis 04/06/2020   Suspected Sepsis ([place]) 04/06/2020   Possible NSTEMI (non-ST elevated myocardial infarction) ([place]) 04/06/2020    Suspected Overdose, undetermined intent, initial encounter 04/06/2020   Schizoaffective disorder, bipolar type ([place])    Bipolar affective disorder, current episode manic with psychotic symptoms ([place]) 12/25/2018   ESRD on hemodialysis ([place]) 12/25/2018   History of adenomatous polyp of colon 11/20/2018   Loss of weight 10/17/2017   Hypernatremia 04/17/2016   CKD (chronic kidney disease) stage 3, GFR 30-59 ml/min (HCC) 04/16/2016   Normocytic anemia 04/16/2016   Meningioma ([place]) 04/16/2016   Acute psychosis ([place]) 04/15/2016   Hyponatremia 04/15/2016   AKI (acute kidney injury) ([place]) 04/15/2016   Special screening for malignant  neoplasms, colon    Thrombocytopenia ([place]) 01/05/2016   Altered mental status 04/19/2015   PCP:  Vidal Schwalbe, MD Pharmacy:   [place], [place] - [address] [place] [address] Phone: [phone] Fax: [place] [address] Phone: [phone] Fax: [place], [place] [address] [place] [address] Phone: [phone] Fax: [phone]     Social Determinants of Health (SDOH) Social History: SDOH Screenings   Alcohol Screen: Low Risk  (12/25/2018)  Tobacco Use: Low Risk  (09/20/2022)   SDOH Interventions:     Readmission Risk Interventions     No data to display

## 2022-10-25 NOTE — BH Assessment (Addendum)
Per [place] Lysbeth Galas E.) and [place] AC Susanne Greenhouse.)   Referral information for Psychiatric Hospitalization faxed to;    [facility] [phone]- [phone]), beds   [facility] [phone]- [phone]),    [facility] [phone]), no beds   [facility] [phone]--- [phone]--- [phone]--- [phone])   [address] [phone]),    [facility] [phone] -or- [phone]),    [facility] [phone] phone-- [phone])   [phone]   [phone],    [facility]([phone].   [facility]([phone]

## 2022-10-25 NOTE — ED Notes (Signed)
Report given to Kate, RN

## 2022-10-25 NOTE — ED Notes (Signed)
VOL/Pt being referred out for Inpt Admit

## 2022-10-25 NOTE — ED Notes (Signed)
Dialysis stated they will be sending for pt within the next 30 minutes

## 2022-10-25 NOTE — Progress Notes (Signed)
Pt ran for 2hrs 68mn d/t clotted dialyzer.  Removed 600ML of fluid.  Pt will be back for another tx 10/26/22    10/25/22 1315  Vitals  Temp (!) 97.3 F (36.3 C)  Temp Source Oral  BP 127/63  MAP (mmHg) 80  BP Location Right Arm  BP Method Automatic  Patient Position (if appropriate) Lying  Pulse Rate (!) 103  Pulse Rate Source Monitor  ECG Heart Rate (!) 103  Resp 20  Oxygen Therapy  SpO2 98 %  O2 Device Room Air  Patient Activity (if Appropriate) In bed  Pulse Oximetry Type Continuous  During Treatment Monitoring  HD Safety Checks Performed Yes  Intra-Hemodialysis Comments Tx completed;See progress note  Post Treatment  Dialyzer Clearance Clotted  Duration of HD Treatment -hour(s) 2.06 hour(s)  Hemodialysis Intake (mL) 100 mL  Liters Processed 48.9  Fluid Removed (mL) 600 mL  Tolerated HD Treatment Yes  Post-Hemodialysis Comments  (see note)  AVG/AVF Arterial Site Held (minutes) 10 minutes  AVG/AVF Venous Site Held (minutes) 10 minutes  Fistula / Graft Left Upper arm Arteriovenous fistula  Placement Date/Time: 04/06/20 0000   Orientation: Left  Access Location: Upper arm  Access Type: Arteriovenous fistula  Site Condition No complications  Fistula / Graft Assessment Present;Thrill;Bruit  Status Deaccessed  Drainage Description None

## 2022-10-25 NOTE — Progress Notes (Signed)
[facility]  ROUNDING NOTE   Subjective:   Madison [name] is a 72 y.o. female with past medical history of schizophrenia and end-stage renal disease on hemodialysis.  Patient presents to the emergency department complaining of leg pain however caregiver state that due to patient's mental status, she has not been taking her medications or going to scheduled dialysis treatments.  Patient is known to our practice from previous admissions and receives outpatient dialysis treatments at [facility] on a MWF schedule, supervised by [facility] physicians. Patient is seen resting on bed in the hallway.  Caregiver at bedside.  Patient alert however confused.  She is unaware of her last dialysis treatment but knows she goes MWF. Patient's caregiver says she has missed one dialysis treatment this week. Patient complains of lower extremity pain, denies recent falls. States she has not felt well, so has not taken her medications. Becomes tearful at times when talking about unrelated topics. Patient does ask if she will need dialysis long term.   On ED arrival, labs are as followed: Potassium 5.2 BUN 64, creatinine 8.69 with GFR 4. Potassium this morning 6.1 with phosphorus 8.0. Hgb 10.3 on ED arrival, now 8.8.Respiratory panel negative. ECHO shows EF 55-60 with a Grade 1 diastolic dysfunction.   We have been consulted to provide dialysis.   Objective:  Vital signs in last 24 hours:  Temp:  [97.9 F (36.6 C)-98.2 F (36.8 C)] 98 F (36.7 C) (02/01 0957) Pulse Rate:  [70-95] 70 (02/01 1130) Resp:  [15-21] 21 (02/01 1130) BP: (126-140)/(60-77) 139/60 (02/01 1130) SpO2:  [95 %-100 %] 100 % (02/01 1130) Weight:  [50.9 kg] 50.9 kg (02/01 0956)  Weight change:  Filed Weights   10/25/22 0956  Weight: 50.9 kg    Intake/Output: No intake/output data recorded.   Intake/Output this shift:  No intake/output data recorded.  Physical Exam: General: anxious  Head: Normocephalic, atraumatic. Moist  oral mucosal membranes  Eyes: Anicteric  Lungs:  Clear to auscultation, normal effort, room air  Heart: Regular rate and rhythm  Abdomen:  Soft, nontender, nondistended  Extremities:  No peripheral edema.  Neurologic: Alert and oriented, moving all four extremities  Skin: No lesions  Access: Lt AVF    Basic Metabolic Panel: Recent Labs  Lab 10/24/22 1139 10/25/22 1008  NA 139 139  K 5.2* 6.1*  CL 96* 97*  CO2 28 27  GLUCOSE 111* 134*  BUN 64* 77*  CREATININE 8.69* 10.35*  CALCIUM 9.6 9.3  PHOS  --  8.0*    Liver Function Tests: Recent Labs  Lab 10/24/22 1139 10/25/22 1008  AST 16  --   ALT 10  --   ALKPHOS 75  --   BILITOT 0.7  --   PROT 7.2  --   ALBUMIN 3.6 2.9*   No results for input(s): "LIPASE", "AMYLASE" in the last 168 hours. No results for input(s): "AMMONIA" in the last 168 hours.  CBC: Recent Labs  Lab 10/24/22 1139 10/25/22 1008  WBC 5.8 5.2  HGB 10.3* 8.8*  HCT 32.4* 26.9*  MCV 97.9 97.8  PLT 194 166    Cardiac Enzymes: No results for input(s): "CKTOTAL", "CKMB", "CKMBINDEX", "TROPONINI" in the last 168 hours.  BNP: Invalid input(s): "POCBNP"  CBG: No results for input(s): "GLUCAP" in the last 168 hours.  Microbiology: Results for orders placed or performed during the hospital encounter of 10/24/22  Resp panel by RT-PCR (RSV, Flu A&B, Covid) Anterior Nasal Swab     Status:  None   Collection Time: 10/24/22  3:04 PM   Specimen: Anterior Nasal Swab  Result Value Ref Range Status   SARS Coronavirus 2 by RT PCR NEGATIVE NEGATIVE Final    Comment: (NOTE) SARS-CoV-2 target nucleic acids are NOT DETECTED.  The SARS-CoV-2 RNA is generally detectable in upper respiratory specimens during the acute phase of infection. The lowest concentration of SARS-CoV-2 viral copies this assay can detect is 138 copies/mL. A negative result does not preclude SARS-Cov-2 infection and should not be used as the sole basis for treatment or other patient  management decisions. A negative result may occur with  improper specimen collection/handling, submission of specimen other than nasopharyngeal swab, presence of viral mutation(s) within the areas targeted by this assay, and inadequate number of viral copies(<138 copies/mL). A negative result must be combined with clinical observations, patient history, and epidemiological information. The expected result is Negative.  Fact Sheet for Patients:  EntrepreneurPulse.com.au  Fact Sheet for Healthcare Providers:  IncredibleEmployment.be  This test is no t yet approved or cleared by the [place] FDA and  has been authorized for detection and/or diagnosis of SARS-CoV-2 by FDA under an Emergency Use Authorization (EUA). This EUA will remain  in effect (meaning this test can be used) for the duration of the COVID-19 declaration under Section 564(b)(1) of the Act, 21 U.S.C.section 360bbb-3(b)(1), unless the authorization is terminated  or revoked sooner.       Influenza A by PCR NEGATIVE NEGATIVE Final   Influenza B by PCR NEGATIVE NEGATIVE Final    Comment: (NOTE) The Xpert Xpress SARS-CoV-2/FLU/RSV plus assay is intended as an aid in the diagnosis of influenza from Nasopharyngeal swab specimens and should not be used as a sole basis for treatment. Nasal washings and aspirates are unacceptable for Xpert Xpress SARS-CoV-2/FLU/RSV testing.  Fact Sheet for Patients: EntrepreneurPulse.com.au  Fact Sheet for Healthcare Providers: IncredibleEmployment.be  This test is not yet approved or cleared by the [place] FDA and has been authorized for detection and/or diagnosis of SARS-CoV-2 by FDA under an Emergency Use Authorization (EUA). This EUA will remain in effect (meaning this test can be used) for the duration of the COVID-19 declaration under Section 564(b)(1) of the Act, 21 U.S.C. section 360bbb-3(b)(1),  unless the authorization is terminated or revoked.     Resp Syncytial Virus by PCR NEGATIVE NEGATIVE Final    Comment: (NOTE) Fact Sheet for Patients: EntrepreneurPulse.com.au  Fact Sheet for Healthcare Providers: IncredibleEmployment.be  This test is not yet approved or cleared by the [place] FDA and has been authorized for detection and/or diagnosis of SARS-CoV-2 by FDA under an Emergency Use Authorization (EUA). This EUA will remain in effect (meaning this test can be used) for the duration of the COVID-19 declaration under Section 564(b)(1) of the Act, 21 U.S.C. section 360bbb-3(b)(1), unless the authorization is terminated or revoked.  Performed at [facility], [address] [place], [place] [address]     Coagulation Studies: No results for input(s): "LABPROT", "INR" in the last 72 hours.  Urinalysis: No results for input(s): "COLORURINE", "LABSPEC", "PHURINE", "GLUCOSEU", "HGBUR", "BILIRUBINUR", "KETONESUR", "PROTEINUR", "UROBILINOGEN", "NITRITE", "LEUKOCYTESUR" in the last 72 hours.  Invalid input(s): "APPERANCEUR"    Imaging: No results found.   Medications:     Chlorhexidine Gluconate Cloth  6 each Topical Q0600   alteplase, heparin, lidocaine (PF), lidocaine-prilocaine, pentafluoroprop-tetrafluoroeth  Assessment/ Plan:  Madison [name] is a 73 y.o.  female with past medical history of schizophrenia and end-stage renal disease on  hemodialysis.  Patient presents to the emergency department complaining of leg pain however caregiver state that due to patient's mental status, she has not been taking her medications or going to scheduled dialysis treatments.  Hyperkalemia with end-stage renal disease on hemodialysis.  Patient has missed 1 dialysis treatment this week.  Will scheduled to receive dialysis today, UF 1.5 L as tolerated.  Will receive dialysis on 1K bath to manage elevated potassium, 6.1.  Next  treatment scheduled for Friday to maintain outpatient schedule.  2. Anemia of chronic kidney disease Lab Results  Component Value Date   HGB 8.8 (L) 10/25/2022   Received Mircera outpatient.  Hemoglobin 10.3 on admission, now 8.8. Will order low dose EPO. May need to monitor for signs of bleeding.   3. Secondary Hyperparathyroidism: with outpatient labs: PTH 408, phosphorus 5.9, calcium 9.6 on 10/03/22.    Lab Results  Component Value Date   PTH 449 (H) 04/11/2020   CALCIUM 9.3 10/25/2022   PHOS 8.0 (H) 10/25/2022  Currently prescribed Auryxia, cinacalcet and calcitriol outpatient.  Calcium stable however phosphorus elevated.  Will continue to monitor for now.    LOS: 0   2/1/202412:04 PM

## 2022-10-25 NOTE — ED Notes (Signed)
Meal tray delivered and apple juice provided.

## 2022-10-26 DIAGNOSIS — F250 Schizoaffective disorder, bipolar type: Secondary | ICD-10-CM | POA: Diagnosis not present

## 2022-10-26 LAB — RENAL FUNCTION PANEL
Albumin: 2.8 g/dL — ABNORMAL LOW (ref 3.5–5.0)
Anion gap: 14 (ref 5–15)
BUN: 54 mg/dL — ABNORMAL HIGH (ref 8–23)
CO2: 27 mmol/L (ref 22–32)
Calcium: 8.8 mg/dL — ABNORMAL LOW (ref 8.9–10.3)
Chloride: 100 mmol/L (ref 98–111)
Creatinine, Ser: 7.4 mg/dL — ABNORMAL HIGH (ref 0.44–1.00)
GFR, Estimated: 5 mL/min — ABNORMAL LOW (ref >60)
Glucose, Bld: 109 mg/dL — ABNORMAL HIGH (ref 70–99)
Phosphorus: 6.2 mg/dL — ABNORMAL HIGH (ref 2.5–4.6)
Potassium: 5.6 mmol/L — ABNORMAL HIGH (ref 3.5–5.1)
Sodium: 141 mmol/L (ref 135–145)

## 2022-10-26 LAB — CBC
HCT: 28.1 % — ABNORMAL LOW (ref 36.0–46.0)
Hemoglobin: 9.2 g/dL — ABNORMAL LOW (ref 12.0–15.0)
MCH: 31.7 pg (ref 26.0–34.0)
MCHC: 32.7 g/dL (ref 30.0–36.0)
MCV: 96.9 fL (ref 80.0–100.0)
Platelets: 167 10*3/uL (ref 150–400)
RBC: 2.9 MIL/uL — ABNORMAL LOW (ref 3.87–5.11)
RDW: 15.5 % (ref 11.5–15.5)
WBC: 6.2 10*3/uL (ref 4.0–10.5)
nRBC: 0 % (ref 0.0–0.2)

## 2022-10-26 LAB — HEPATITIS B SURFACE ANTIBODY, QUANTITATIVE: Hep B S AB Quant (Post): 15.5 m[IU]/mL (ref >9.9)

## 2022-10-26 LAB — VALPROIC ACID LEVEL: Valproic Acid Lvl: <10 ug/mL — ABNORMAL LOW (ref 50.0–100.0)

## 2022-10-26 MED ORDER — OLANZAPINE 10 MG IM SOLR
5.0000 mg | Freq: Two times a day (BID) | INTRAMUSCULAR | Status: DC
  Administered 2022-10-26: 5 mg via INTRAMUSCULAR
  Filled 2022-10-26: qty 10

## 2022-10-26 MED ORDER — OLANZAPINE 5 MG PO TABS
5.0000 mg | ORAL_TABLET | Freq: Two times a day (BID) | ORAL | Status: DC
  Filled 2022-10-26: qty 1

## 2022-10-26 MED ORDER — LORAZEPAM 0.5 MG PO TABS
0.5000 mg | ORAL_TABLET | Freq: Once | ORAL | Status: AC
  Administered 2022-10-26: 0.5 mg via ORAL
  Filled 2022-10-26: qty 1

## 2022-10-26 MED ORDER — VALPROIC ACID 250 MG/5ML PO SOLN
500.0000 mg | Freq: Three times a day (TID) | ORAL | Status: DC
  Administered 2022-10-26 – 2022-10-30 (×11): 500 mg via ORAL
  Filled 2022-10-26 (×14): qty 10

## 2022-10-26 MED ORDER — EPOETIN ALFA 10000 UNIT/ML IJ SOLN
INTRAMUSCULAR | Status: AC
  Administered 2022-10-26: 10000 [IU] via INTRAVENOUS
  Filled 2022-10-26: qty 1

## 2022-10-26 MED ORDER — OLANZAPINE 5 MG PO TBDP
10.0000 mg | ORAL_TABLET | Freq: Once | ORAL | Status: AC
  Administered 2022-10-26: 10 mg via ORAL
  Filled 2022-10-26: qty 2

## 2022-10-26 MED ORDER — OLANZAPINE 5 MG PO TABS
5.0000 mg | ORAL_TABLET | Freq: Two times a day (BID) | ORAL | Status: DC
  Administered 2022-10-26 – 2022-10-30 (×6): 5 mg via ORAL
  Filled 2022-10-26 (×7): qty 1

## 2022-10-26 NOTE — ED Notes (Signed)
Sister visiting with pt at this time. Security present.

## 2022-10-26 NOTE — Progress Notes (Signed)
Post hd rn assessment 

## 2022-10-26 NOTE — Progress Notes (Signed)
Pt completed 3 hour HD treatment w/ no complications. Alert, stable, oriented to self, hyperverbal, disorganized speech and thinking w/ flight of ideas. Report to ED RN. Start: 0926 End: 3419 1537m fluid removed Epogen 10000u given w/ HD 52kg post hd bed weight

## 2022-10-26 NOTE — BH Assessment (Addendum)
Referral checks;    [facility] [phone]), no beds   [facility] [phone]), Facility does not have a Geriatric Psyc unit   [facility] [phone]), no beds   [place] [facility] [phone]--- [phone])   [facility] [phone]), No answer   [facility] [phone] -[phone]), Patient denied due to patient being on dialysis   [facility] ([phone] phone-- [phone]) No answer, phone message said that if referral is accepted facility will contact   [phone]   [phone] [phone], No answer, voicemail was left to return phone call   [facility]([phone].   [phone]

## 2022-10-26 NOTE — Progress Notes (Signed)
Pt HD start w/ no complications. BFR 350 d/t high vp. Pt anxious, tearful, refuses to take pills (Midodrine). Hyperverbal, disorganized speech and flight of ideas. Continue to monitor. Safety maintained.

## 2022-10-26 NOTE — Discharge Planning (Signed)
ESTABLISHED HEMODIALYSIS Outpatient Facility: [facility] [address] [place], [place] [address] [phone]  Scheduled days: Monday Wednesday and Friday  Treatment time: 6:30am  Confirmed above schedule with [facility].

## 2022-10-26 NOTE — ED Notes (Signed)
vol/pending inpatient admit.Marland KitchenMarland Kitchen

## 2022-10-26 NOTE — ED Notes (Signed)
Pt given lunch tray per request.

## 2022-10-26 NOTE — Progress Notes (Signed)
Pre hd rn assessment 

## 2022-10-26 NOTE — ED Notes (Signed)
VOL/Pending Geropsych Placement

## 2022-10-26 NOTE — BH Assessment (Signed)
Sister returned writer's phone call, explained the process for inpatient and possible TOC consult when she is stable, in the event she doesn't get a bed at a psych hospital. Explain the plan for her to receive medications and adjustments in the ER. Sisters verbally their understanding and requested to contact the other sister Jamie Kato [phone]) with updates.

## 2022-10-26 NOTE — Progress Notes (Signed)
[facility]  ROUNDING NOTE   Subjective:   Madison [name] is a 72 y.o. female with past medical history of schizophrenia and end-stage renal disease on hemodialysis.  Patient presents to the emergency department complaining of leg pain however caregiver state that due to patient's mental status, she has not been taking her medications or going to scheduled dialysis treatments.  Patient is known to our practice from previous admissions and receives outpatient dialysis treatments at [facility] on a MWF schedule, supervised by [facility] physicians.   Patient seen and evaluated during dialysis   HEMODIALYSIS FLOWSHEET:  Blood Flow Rate (mL/min): 300 mL/min Arterial Pressure (mmHg): -100 mmHg Venous Pressure (mmHg): 230 mmHg TMP (mmHg): 17 mmHg Ultrafiltration Rate (mL/min): 651 mL/min Dialysate Flow Rate (mL/min): 300 ml/min  Tolerating treatment well No obvious complaints    Objective:  Vital signs in last 24 hours:  Temp:  [97.3 F (36.3 C)-98.8 F (37.1 C)] 98.8 F (37.1 C) (02/02 0906) Pulse Rate:  [87-103] 95 (02/02 1200) Resp:  [16-30] 18 (02/02 1200) BP: (120-141)/(58-99) 133/81 (02/02 1200) SpO2:  [97 %-100 %] 100 % (02/02 1200) Weight:  [50.5 kg-60.4 kg] 60.4 kg (02/02 0905)  Weight change:  Filed Weights   10/25/22 0956 10/25/22 1416 10/26/22 0905  Weight: 50.9 kg 50.5 kg 60.4 kg    Intake/Output: I/O last 3 completed shifts: In: -  Out: 600 [Other:600]   Intake/Output this shift:  No intake/output data recorded.  Physical Exam: General: NAD  Head: Normocephalic, atraumatic. Moist oral mucosal membranes  Eyes: Anicteric  Lungs:  Clear to auscultation, normal effort, room air  Heart: Regular rate and rhythm  Abdomen:  Soft, nontender, nondistended  Extremities:  No peripheral edema.  Neurologic: Alert and oriented, moving all four extremities  Skin: No lesions  Access: Lt AVF    Basic Metabolic Panel: Recent Labs  Lab 10/24/22 1139  10/25/22 1008 10/26/22 0919  NA 139 139 141  K 5.2* 6.1* 5.6*  CL 96* 97* 100  CO2 '28 27 27  '$ GLUCOSE 111* 134* 109*  BUN 64* 77* 54*  CREATININE 8.69* 10.35* 7.40*  CALCIUM 9.6 9.3 8.8*  PHOS  --  8.0* 6.2*     Liver Function Tests: Recent Labs  Lab 10/24/22 1139 10/25/22 1008 10/26/22 0919  AST 16  --   --   ALT 10  --   --   ALKPHOS 75  --   --   BILITOT 0.7  --   --   PROT 7.2  --   --   ALBUMIN 3.6 2.9* 2.8*    No results for input(s): "LIPASE", "AMYLASE" in the last 168 hours. No results for input(s): "AMMONIA" in the last 168 hours.  CBC: Recent Labs  Lab 10/24/22 1139 10/25/22 1008 10/26/22 0919  WBC 5.8 5.2 6.2  HGB 10.3* 8.8* 9.2*  HCT 32.4* 26.9* 28.1*  MCV 97.9 97.8 96.9  PLT 194 166 167     Cardiac Enzymes: No results for input(s): "CKTOTAL", "CKMB", "CKMBINDEX", "TROPONINI" in the last 168 hours.  BNP: Invalid input(s): "POCBNP"  CBG: Recent Labs  Lab 10/25/22 [place]*    Microbiology: Results for orders placed or performed during the hospital encounter of 10/24/22  Resp panel by RT-PCR (RSV, Flu A&B, Covid) Anterior Nasal Swab     Status: None   Collection Time: 10/24/22  3:04 PM   Specimen: Anterior Nasal Swab  Result Value Ref Range Status   SARS Coronavirus 2 by RT  PCR NEGATIVE NEGATIVE Final    Comment: (NOTE) SARS-CoV-2 target nucleic acids are NOT DETECTED.  The SARS-CoV-2 RNA is generally detectable in upper respiratory specimens during the acute phase of infection. The lowest concentration of SARS-CoV-2 viral copies this assay can detect is 138 copies/mL. A negative result does not preclude SARS-Cov-2 infection and should not be used as the sole basis for treatment or other patient management decisions. A negative result may occur with  improper specimen collection/handling, submission of specimen other than nasopharyngeal swab, presence of viral mutation(s) within the areas targeted by this assay, and  inadequate number of viral copies(<138 copies/mL). A negative result must be combined with clinical observations, patient history, and epidemiological information. The expected result is Negative.  Fact Sheet for Patients:  EntrepreneurPulse.com.au  Fact Sheet for Healthcare Providers:  IncredibleEmployment.be  This test is no t yet approved or cleared by the [place] FDA and  has been authorized for detection and/or diagnosis of SARS-CoV-2 by FDA under an Emergency Use Authorization (EUA). This EUA will remain  in effect (meaning this test can be used) for the duration of the COVID-19 declaration under Section 564(b)(1) of the Act, 21 U.S.C.section 360bbb-3(b)(1), unless the authorization is terminated  or revoked sooner.       Influenza A by PCR NEGATIVE NEGATIVE Final   Influenza B by PCR NEGATIVE NEGATIVE Final    Comment: (NOTE) The Xpert Xpress SARS-CoV-2/FLU/RSV plus assay is intended as an aid in the diagnosis of influenza from Nasopharyngeal swab specimens and should not be used as a sole basis for treatment. Nasal washings and aspirates are unacceptable for Xpert Xpress SARS-CoV-2/FLU/RSV testing.  Fact Sheet for Patients: EntrepreneurPulse.com.au  Fact Sheet for Healthcare Providers: IncredibleEmployment.be  This test is not yet approved or cleared by the [place] FDA and has been authorized for detection and/or diagnosis of SARS-CoV-2 by FDA under an Emergency Use Authorization (EUA). This EUA will remain in effect (meaning this test can be used) for the duration of the COVID-19 declaration under Section 564(b)(1) of the Act, 21 U.S.C. section 360bbb-3(b)(1), unless the authorization is terminated or revoked.     Resp Syncytial Virus by PCR NEGATIVE NEGATIVE Final    Comment: (NOTE) Fact Sheet for Patients: EntrepreneurPulse.com.au  Fact Sheet for Healthcare  Providers: IncredibleEmployment.be  This test is not yet approved or cleared by the [place] FDA and has been authorized for detection and/or diagnosis of SARS-CoV-2 by FDA under an Emergency Use Authorization (EUA). This EUA will remain in effect (meaning this test can be used) for the duration of the COVID-19 declaration under Section 564(b)(1) of the Act, 21 U.S.C. section 360bbb-3(b)(1), unless the authorization is terminated or revoked.  Performed at [facility], [address] [place], [place] [address]     Coagulation Studies: No results for input(s): "LABPROT", "INR" in the last 72 hours.  Urinalysis: No results for input(s): "COLORURINE", "LABSPEC", "PHURINE", "GLUCOSEU", "HGBUR", "BILIRUBINUR", "KETONESUR", "PROTEINUR", "UROBILINOGEN", "NITRITE", "LEUKOCYTESUR" in the last 72 hours.  Invalid input(s): "APPERANCEUR"    Imaging: No results found.   Medications:     acetaminophen  650 mg Oral TID   ARIPiprazole  5 mg Oral Daily   benztropine  1 mg Oral BID   Chlorhexidine Gluconate Cloth  6 each Topical Q0600   epoetin alfa  10,000 Units Intravenous Q M,W,F-HD   meloxicam  7.5 mg Oral Daily   midodrine  15 mg Oral Q M,W,F-HD   OLANZapine  5 mg Oral BID  Or   OLANZapine  5 mg Intramuscular BID   pantoprazole  40 mg Oral Daily   sodium zirconium cyclosilicate  10 g Oral Daily   valproic acid  500 mg Oral TID WC   alteplase, heparin, lidocaine (PF), lidocaine-prilocaine, pentafluoroprop-tetrafluoroeth  Assessment/ Plan:  Madison [name] is a 72 y.o.  female with past medical history of schizophrenia and end-stage renal disease on hemodialysis.  Patient presents to the emergency department complaining of leg pain however caregiver state that due to patient's mental status, she has not been taking her medications or going to scheduled dialysis treatments.  Hyperkalemia with end-stage renal disease on hemodialysis.  Patient  has missed 1 dialysis treatment this week.  Will scheduled to receive dialysis today, UF 1.5 L as tolerated.  Will receive dialysis on 1K bath to manage elevated potassium, 6.1.  Next treatment scheduled for Friday to maintain outpatient schedule.  2. Anemia of chronic kidney disease Lab Results  Component Value Date   HGB 9.2 (L) 10/26/2022   Received Mircera outpatient.  Hgb acceptable, continue low dose EPO  3. Secondary Hyperparathyroidism: with outpatient labs: PTH 408, phosphorus 5.9, calcium 9.6 on 10/03/22.    Lab Results  Component Value Date   PTH 449 (H) 04/11/2020   CALCIUM 8.8 (L) 10/26/2022   PHOS 6.2 (H) 10/26/2022  Currently prescribed Auryxia, cinacalcet and calcitriol outpatient. Phosphorus elevated, but improving with dialysis.    LOS: 0   2/2/202412:27 PM

## 2022-10-26 NOTE — ED Notes (Addendum)
Pt out of bed and walking into room one. Pt states she needs to have a bm and take a bath. RN assisted pt in bathroom with bm and cleansing self. Bath with warm wipes given to patient and clean clothes provided. Lotion applied diffusely to whole body of pt per pt request. Upon completion, pt assisted back into bed and positioned for comfort. Sheets and blankets straightened on pt bed per pt request. Pt notably agitated and speaking with flight of ideas and requiring frequent redirection. Pt currently in bed with bed low and locked, side rails raised, and bed alarm in use. Of note, pt ambulated to and from bathroom with use of walker with independent and steady gait.

## 2022-10-26 NOTE — ED Notes (Addendum)
Pt assisted to toilet and given ice chips per request. Pt is clean and dry.

## 2022-10-26 NOTE — ED Notes (Signed)
Pt refusing PO meds- EDP notified.

## 2022-10-26 NOTE — BH Assessment (Signed)
Writer called and left a HIPPA Compliant message with sister [phone]), requesting a return phone call.

## 2022-10-26 NOTE — ED Notes (Signed)
Pt in wine scrubs, remains with purple fleece jacket and cross necklace for comfort. Pt denies SI/HI. Pt alert, oriented to self only.

## 2022-10-26 NOTE — ED Notes (Signed)
Visitor departed at this time.

## 2022-10-26 NOTE — BH Assessment (Signed)
Writer called and left a HIPPA Compliant message with sister ([phone]), requesting a return phone call.

## 2022-10-26 NOTE — Consult Note (Signed)
Client was in dialysis this morning.  The psych team spoke with her sisters who had questions about "the next steps".  The team explained her psych medications were being adjusted to assist in stabilizing her as she is refusing medications.  Meanwhile, they are seeking gero psych for her with the dilemma of her dementia diagnosis and requiring dialysis.  Also explained that she may stabilize psychiatrically prior to placement and then would be a TOC placement since her care home reported she needed a higher level of care.    Waylan Boga, PMHNP

## 2022-10-26 NOTE — ED Notes (Addendum)
Pt talks and mumbles constantly, states she hasn't been given food or medicine today and complains that family members are getting in her mail and she has "too much to do, I can't stay here." Pt agitated and states staff are getting her worked up. Pt given reassurance and redirection, offered snack, and informed she will get evening meds in about and hour and a half. Pt given comb per request, Bible to read, and music played to relax pt.

## 2022-10-26 NOTE — ED Notes (Signed)
Pt to dialysis.

## 2022-10-26 NOTE — BH Assessment (Signed)
Received return phone call from patient's sister Madison [name]). Updated her on process. She advised if someone can not reach her, call the other sister Madison [name]).

## 2022-10-26 NOTE — BH Assessment (Signed)
Referral checks;    [facility] [phone]), no beds   [facility] [phone]), Facility does not have a Geriatric Psyc unit   [facility] [phone]), no beds   [place] [facility] [phone]--- [phone]), Declined due to dialysis and dementia   [facility] [phone]), Declined due to dialysis and dementia   [facility] [phone] -or- [phone]), Patient denied due to patient being on dialysis   [facility] ([phone] phone-- [phone]) No answer, phone message said that if referral is accepted facility will contact   [phone], Declined due to dialysis and dementia   [place] ([phone] [phone], No answer, voicemail was left to return phone call   [facility]([phone], Declined due to dementia   [facility][phone], Declined due to dialysis and dementia

## 2022-10-26 NOTE — ED Notes (Signed)
Purple jacket and red beaded bracelet placed with pt belonging at this time.Bag labeled and remains at nurses station. Pt given additional warm blankets and repositioned into bed.

## 2022-10-27 DIAGNOSIS — F250 Schizoaffective disorder, bipolar type: Secondary | ICD-10-CM

## 2022-10-27 LAB — CBC
HCT: 35.3 % — ABNORMAL LOW (ref 36.0–46.0)
Hemoglobin: 10.9 g/dL — ABNORMAL LOW (ref 12.0–15.0)
MCH: 31.3 pg (ref 26.0–34.0)
MCHC: 30.9 g/dL (ref 30.0–36.0)
MCV: 101.4 fL — ABNORMAL HIGH (ref 80.0–100.0)
Platelets: 212 10*3/uL (ref 150–400)
RBC: 3.48 MIL/uL — ABNORMAL LOW (ref 3.87–5.11)
RDW: 15.6 % — ABNORMAL HIGH (ref 11.5–15.5)
WBC: 6.5 10*3/uL (ref 4.0–10.5)
nRBC: 0 % (ref 0.0–0.2)

## 2022-10-27 LAB — RENAL FUNCTION PANEL
Albumin: 3.5 g/dL (ref 3.5–5.0)
Anion gap: 13 (ref 5–15)
BUN: 45 mg/dL — ABNORMAL HIGH (ref 8–23)
CO2: 28 mmol/L (ref 22–32)
Calcium: 9.6 mg/dL (ref 8.9–10.3)
Chloride: 97 mmol/L — ABNORMAL LOW (ref 98–111)
Creatinine, Ser: 6.12 mg/dL — ABNORMAL HIGH (ref 0.44–1.00)
GFR, Estimated: 7 mL/min — ABNORMAL LOW (ref >60)
Glucose, Bld: 101 mg/dL — ABNORMAL HIGH (ref 70–99)
Phosphorus: 6.1 mg/dL — ABNORMAL HIGH (ref 2.5–4.6)
Potassium: 5.1 mmol/L (ref 3.5–5.1)
Sodium: 138 mmol/L (ref 135–145)

## 2022-10-27 LAB — PARATHYROID HORMONE, INTACT (NO CA): PTH: 424 pg/mL — ABNORMAL HIGH (ref 15–65)

## 2022-10-27 LAB — TSH: TSH: 3.452 u[IU]/mL (ref 0.350–4.500)

## 2022-10-27 NOTE — BH Assessment (Addendum)
Referral information re-faxed to final 3 hospitals;   [facility] [phone]), Facility reports no female beds available  [facility] [phone] phone-- [phone])  [phone],     Facilities that have declined patient:  [facility] [phone]-- [phone], [facility] and [phone], Declined due to dialysis and dementia [phone]), Patient denied due to patient being on dialysis  [phone], Declined due to dialysis and dementia [phone]), [facility] to [phone], Declined due to dialysis and dementia

## 2022-10-27 NOTE — ED Provider Notes (Signed)
Emergency Medicine Observation Re-evaluation Note  Madison [name] is a 72 y.o. female, seen on rounds today.  Pt initially presented to the ED for complaints of Leg Pain and behavior evaluation  Currently, the patient is is no acute distress. Denies any concerns at this time.  Physical Exam  Blood pressure 135/68, pulse 88, temperature 98.4 F (36.9 C), temperature source Oral, resp. rate 16, weight 52 kg, SpO2 99 %.  Physical Exam: General: No apparent distress Pulm: Normal WOB Neuro: Moving all extremities Psych: Resting comfortably     ED Course / MDM     I have reviewed the labs performed to date as well as medications administered while in observation.  Recent changes in the last 24 hours include: No acute events overnight.  Plan   Current plan: Patient awaiting psychiatric disposition. Patient is not under full IVC at this time.    Nathaniel Man, MD 10/27/22 0330

## 2022-10-27 NOTE — ED Notes (Signed)
Patient's sister in to see Patient, supervised visit, Patient remains calm and cooperative, she appears to be happy and more content with her sister here talking to her, Staff will continue to monitor for safety.

## 2022-10-27 NOTE — ED Notes (Signed)
Family at bedside.  Family took belongings excluding 1 purple fleece jacket, 1 pair of blue shoes, and 1 red bracelet.  Belongings bag 1/1

## 2022-10-27 NOTE — Progress Notes (Signed)
[facility]  ROUNDING NOTE   Subjective:   Madison [name] is a 72 y.o. female with past medical history of schizophrenia and end-stage renal disease on hemodialysis.  Patient presents to the emergency department complaining of leg pain however caregiver state that due to patient's mental status, she has not been taking her medications or going to scheduled dialysis treatments.  Patient is known to our practice from previous admissions and receives outpatient dialysis treatments at [facility] on a MWF schedule, supervised by [facility] physicians.   Patient seen sitting up in chair in the hallway Alert and continues to ramble on various thoughts Patient does mention she has thought about discontinuing dialysis.  States "I have done this a long time you alls  way."  Objective:  Vital signs in last 24 hours:  Temp:  [98.3 F (36.8 C)-98.4 F (36.9 C)] 98.3 F (36.8 C) (02/03 0903) Pulse Rate:  [88-95] 95 (02/03 0903) Resp:  [15-19] 17 (02/03 0903) BP: (118-137)/(66-81) 118/77 (02/03 0903) SpO2:  [99 %-100 %] 100 % (02/03 0903) Weight:  [52 kg] 52 kg (02/02 1247)  Weight change: 9.5 kg Filed Weights   10/25/22 1416 10/26/22 0905 10/26/22 1247  Weight: 50.5 kg 60.4 kg 52 kg    Intake/Output: I/O last 3 completed shifts: In: -  Out: 1500 [Other:1500]   Intake/Output this shift:  No intake/output data recorded.  Physical Exam: General: NAD  Head: Normocephalic, atraumatic. Moist oral mucosal membranes  Eyes: Anicteric  Lungs:  Clear to auscultation, normal effort, room air  Heart: Regular rate and rhythm  Abdomen:  Soft, nontender, nondistended  Extremities:  No peripheral edema.  Neurologic: Alert, moving all four extremities  Skin: No lesions  Access: Lt AVF    Basic Metabolic Panel: Recent Labs  Lab 10/24/22 1139 10/25/22 1008 10/26/22 0919 10/27/22 0905  NA 139 139 141 138  K 5.2* 6.1* 5.6* 5.1  CL 96* 97* 100 97*  CO2 '28 27 27 28  '$ GLUCOSE 111*  134* 109* 101*  BUN 64* 77* 54* 45*  CREATININE 8.69* 10.35* 7.40* 6.12*  CALCIUM 9.6 9.3 8.8* 9.6  PHOS  --  8.0* 6.2* 6.1*     Liver Function Tests: Recent Labs  Lab 10/24/22 1139 10/25/22 1008 10/26/22 0919 10/27/22 0905  AST 16  --   --   --   ALT 10  --   --   --   ALKPHOS 75  --   --   --   BILITOT 0.7  --   --   --   PROT 7.2  --   --   --   ALBUMIN 3.6 2.9* 2.8* 3.5    No results for input(s): "LIPASE", "AMYLASE" in the last 168 hours. No results for input(s): "AMMONIA" in the last 168 hours.  CBC: Recent Labs  Lab 10/24/22 1139 10/25/22 1008 10/26/22 0919 10/27/22 0905  WBC 5.8 5.2 6.2 6.5  HGB 10.3* 8.8* 9.2* 10.9*  HCT 32.4* 26.9* 28.1* 35.3*  MCV 97.9 97.8 96.9 101.4*  PLT 194 166 167 212     Cardiac Enzymes: No results for input(s): "CKTOTAL", "CKMB", "CKMBINDEX", "TROPONINI" in the last 168 hours.  BNP: Invalid input(s): "POCBNP"  CBG: Recent Labs  Lab 10/25/22 [place]*     Microbiology: Results for orders placed or performed during the hospital encounter of 10/24/22  Resp panel by RT-PCR (RSV, Flu A&B, Covid) Anterior Nasal Swab     Status: None   Collection Time:  10/24/22  3:04 PM   Specimen: Anterior Nasal Swab  Result Value Ref Range Status   SARS Coronavirus 2 by RT PCR NEGATIVE NEGATIVE Final    Comment: (NOTE) SARS-CoV-2 target nucleic acids are NOT DETECTED.  The SARS-CoV-2 RNA is generally detectable in upper respiratory specimens during the acute phase of infection. The lowest concentration of SARS-CoV-2 viral copies this assay can detect is 138 copies/mL. A negative result does not preclude SARS-Cov-2 infection and should not be used as the sole basis for treatment or other patient management decisions. A negative result may occur with  improper specimen collection/handling, submission of specimen other than nasopharyngeal swab, presence of viral mutation(s) within the areas targeted by this assay, and  inadequate number of viral copies(<138 copies/mL). A negative result must be combined with clinical observations, patient history, and epidemiological information. The expected result is Negative.  Fact Sheet for Patients:  EntrepreneurPulse.com.au  Fact Sheet for Healthcare Providers:  IncredibleEmployment.be  This test is no t yet approved or cleared by the [place] FDA and  has been authorized for detection and/or diagnosis of SARS-CoV-2 by FDA under an Emergency Use Authorization (EUA). This EUA will remain  in effect (meaning this test can be used) for the duration of the COVID-19 declaration under Section 564(b)(1) of the Act, 21 U.S.C.section 360bbb-3(b)(1), unless the authorization is terminated  or revoked sooner.       Influenza A by PCR NEGATIVE NEGATIVE Final   Influenza B by PCR NEGATIVE NEGATIVE Final    Comment: (NOTE) The Xpert Xpress SARS-CoV-2/FLU/RSV plus assay is intended as an aid in the diagnosis of influenza from Nasopharyngeal swab specimens and should not be used as a sole basis for treatment. Nasal washings and aspirates are unacceptable for Xpert Xpress SARS-CoV-2/FLU/RSV testing.  Fact Sheet for Patients: EntrepreneurPulse.com.au  Fact Sheet for Healthcare Providers: IncredibleEmployment.be  This test is not yet approved or cleared by the [place] FDA and has been authorized for detection and/or diagnosis of SARS-CoV-2 by FDA under an Emergency Use Authorization (EUA). This EUA will remain in effect (meaning this test can be used) for the duration of the COVID-19 declaration under Section 564(b)(1) of the Act, 21 U.S.C. section 360bbb-3(b)(1), unless the authorization is terminated or revoked.     Resp Syncytial Virus by PCR NEGATIVE NEGATIVE Final    Comment: (NOTE) Fact Sheet for Patients: EntrepreneurPulse.com.au  Fact Sheet for Healthcare  Providers: IncredibleEmployment.be  This test is not yet approved or cleared by the [place] FDA and has been authorized for detection and/or diagnosis of SARS-CoV-2 by FDA under an Emergency Use Authorization (EUA). This EUA will remain in effect (meaning this test can be used) for the duration of the COVID-19 declaration under Section 564(b)(1) of the Act, 21 U.S.C. section 360bbb-3(b)(1), unless the authorization is terminated or revoked.  Performed at [facility], [address] [place], [place] [address]     Coagulation Studies: No results for input(s): "LABPROT", "INR" in the last 72 hours.  Urinalysis: No results for input(s): "COLORURINE", "LABSPEC", "PHURINE", "GLUCOSEU", "HGBUR", "BILIRUBINUR", "KETONESUR", "PROTEINUR", "UROBILINOGEN", "NITRITE", "LEUKOCYTESUR" in the last 72 hours.  Invalid input(s): "APPERANCEUR"    Imaging: No results found.   Medications:     acetaminophen  650 mg Oral TID   Chlorhexidine Gluconate Cloth  6 each Topical Q0600   epoetin alfa  10,000 Units Intravenous Q M,W,F-HD   meloxicam  7.5 mg Oral Daily   midodrine  15 mg Oral Q M,W,F-HD   OLANZapine  5 mg Oral BID   Or   OLANZapine  5 mg Intramuscular BID   pantoprazole  40 mg Oral Daily   sodium zirconium cyclosilicate  10 g Oral Daily   valproic acid  500 mg Oral TID WC   alteplase, heparin, lidocaine (PF), lidocaine-prilocaine, pentafluoroprop-tetrafluoroeth  Assessment/ Plan:  Ms. CHIRSTY ARMISTEAD is a 72 y.o.  female with past medical history of schizophrenia and end-stage renal disease on hemodialysis.  Patient presents to the emergency department complaining of leg pain however caregiver state that due to patient's mental status, she has not been taking her medications or going to scheduled dialysis treatments.  Hyperkalemia with end-stage renal disease on hemodialysis.  Potassium remained stable, corrected with dialysis.  Patient received  dialysis yesterday, UF 1.5 L achieved.  Next treatment scheduled for Monday.  Outpatient clinic mentions that patient had expressed thoughts of discontinuing dialysis prior to this admission.  2. Anemia of chronic kidney disease Lab Results  Component Value Date   HGB 10.9 (L) 10/27/2022   Received Mircera outpatient.  Hemoglobin acceptable at this time.  Will hold low-dose EPO for now.  3. Secondary Hyperparathyroidism: with outpatient labs: PTH 408, phosphorus 5.9, calcium 9.6 on 10/03/22.    Lab Results  Component Value Date   PTH 449 (H) 04/11/2020   CALCIUM 9.6 10/27/2022   PHOS 6.1 (H) 10/27/2022  Currently prescribed Auryxia, cinacalcet and calcitriol outpatient.  Will continue to monitor bone minerals during this admission.    LOS: 0   2/3/202411:00 AM

## 2022-10-27 NOTE — ED Notes (Signed)
Patient is vol pending placement 

## 2022-10-27 NOTE — ED Notes (Signed)
Pt given night time snack. Sandwich tray and drink

## 2022-10-27 NOTE — ED Notes (Signed)
Pt was given water and graham crackers. When pt received water, pt refused drinking stating " I only drink ice" writer encouraged trying a sip but pt still refused.

## 2022-10-27 NOTE — Consult Note (Signed)
[facility] Face-to-Face Psychiatry Consult   Reason for Consult:  Acute Psychosis Referring Physician:  Dr. Jori Moll Patient Identification: Madison [name] MRN:  [id number] Principal Diagnosis: Schizoaffective disorder, bipolar type ([place]) Diagnosis:  Principal Problem:   Schizoaffective disorder, bipolar type ([place]) Active Problems:   Altered mental status   ESRD on hemodialysis ([place])   Total Time spent with patient: 30 minutes  Subjective:   Madison [name] is a 72 y.o. female patient admitted with agitation and noncompliance of psychiatric medications.  Today, the client is pleasantly rambling about her and siblings picking tobacco and not being able to inside.  She is focused on her past childhood, consistent with a dementia presentation which her sister feels is an issue for her.  Their mother had dementia with similar presentations.  Madison [name] also has schizoaffective disorder and had stopped taking her medications and refused to go to dialysis prior to admission.  She continues to state she does not need dialysis, had dialysis yesterday.  It is obvious that she does not have capacity to make sound medical decisions at this time and this should be decided by her POA.  She is pleasantly confused, not manic, psych will sign off at this time.  According to the notes, she cannot return to her care home as they decided she needs a higher level of care,  TOC consult in place.    2/2:  Client was in dialysis this morning.  The psych team spoke with her sisters who had questions about "the next steps".  The team explained her psych medications were being adjusted to assist in stabilizing her as she is refusing medications.  Meanwhile, they are seeking gero psych for her with the dilemma of her dementia diagnosis and requiring dialysis.  Also explained that she may stabilize psychiatrically prior to placement and then would be a TOC placement since her care home reported she needed a higher level of care.      Evaluation by Dietrich Pates, PMHNP on admission:  On evaluation, the patient is observed lying in the bed with sister, Madison [name] at the bedside. Patient is rambling, disorganized, restless and unable to answer assessment questions appropriately at this time. She appears to be responding to internal stimuli.   Collateral obtained from sister, Madison [name]: Madison [name] reports patient has been "manic" and has schizophrenia. Madison [name] reports "her schizophrenia flares up from time to time and "it's in the flare up phase." She reports the patient has taken medication for schizophrenia since the age of 62. Madison [name] reports the patient's psych medications are currently being managed by Huey Romans with [facility]. She reports the patient has not taken her psych medications in three months and has been refusing dialysis. Madison [name] reports the patient did go to dialysis this past Friday, accompanied by her sister, Madison [name]. Madison [name] reports the staff at the dialysis center reported she was increasingly restless and pulling at her tubing. Madison [name] reports the patient will most likely require a higher level of care at discharge.   Recommend psychiatric Inpatient admission when medically cleared. Reviewed with Dr. Charna Archer. Psych stabilization may occur before placement is found. TOC is involved in patient's care.   HPI:  Per Dr. Jori Moll, Madison [name] is a 72 y.o. female past medical history significant for schizophrenia, ESRD on HD MWF, lives at the [facility] who presents to the emergency department altered mental status and confusion.  History is provided by the patient's sister at bedside.  States that she has not been taking her psychiatric  medications over the past 3 months.  Over the past week has been refusing to go to dialysis.  Did not go to dialysis on Friday or Monday but was able to go to dialysis yesterday.  Today she was supposed to get another session of dialysis however patient was refusing.  More confused not  acting her normal self.  Denies any suicidal or homicidal ideation.  Only complaints is pain to her legs and back.   Patient is unable to provide a cohesive history.  She is unable to state why she is here.  Just states that it hurts to get dialysis.  Past Psychiatric History: Sister reported patient has had a diagnosis of schizophrenia since the age of 64.  Risk to Self:   Risk to Others:   Prior Inpatient Therapy:   Prior Outpatient Therapy:    Past Medical History:  Past Medical History:  Diagnosis Date   Anxiety    ESRD (end stage renal disease) on dialysis ([place])    Neuropathy    Schizophrenia ([place])     Past Surgical History:  Procedure Laterality Date   CHOLECYSTECTOMY     COLONOSCOPY N/A 01/20/2016   Dr. Oneida Alar: 12 mm tubular adenoma removed from the transverse colon, 4 hyperplastic polyps removed from the rectum and sigmoid colon.  Next colonoscopy planned for April 2020.   ESOPHAGOGASTRODUODENOSCOPY N/A 12/19/2017   Procedure: ESOPHAGOGASTRODUODENOSCOPY (EGD);  Surgeon: [name], MD;  Location: [facility];  Service: Endoscopy;  Laterality: N/A;  8:30am   fistula left arm     GIVENS CAPSULE STUDY N/A 01/14/2018   Procedure: GIVENS CAPSULE STUDY;  Surgeon: [name], MD;  Location: [facility];  Service: Endoscopy;  Laterality: N/A;  7:30am   HEMICOLECTOMY Right    TUBAL LIGATION     Family History:  Family History  Problem Relation Age of Onset   Alzheimer's disease Mother    Cancer Mother        pancreatic   Diabetes Mother    Prostate cancer Father    Kidney failure Sister    Breast cancer Neg Hx    Colon cancer Neg Hx    Family Psychiatric  History: None reported.  Social History:  Social History   Substance and Sexual Activity  Alcohol Use No     Social History   Substance and Sexual Activity  Drug Use No    Social History   Socioeconomic History   Marital status: Single    Spouse name: Not on file   Number of children: Not on  file   Years of education: Not on file   Highest education level: Not on file  Occupational History   Not on file  Tobacco Use   Smoking status: Never   Smokeless tobacco: Never  Vaping Use   Vaping Use: Never used  Substance and Sexual Activity   Alcohol use: No   Drug use: No   Sexual activity: Not Currently    Birth control/protection: Surgical    Comment: hyst/tubal  Other Topics Concern   Not on file  Social History Narrative   Not on file   Social Determinants of Health   Financial Resource Strain: Not on file  Food Insecurity: Not on file  Transportation Needs: Not on file  Physical Activity: Not on file  Stress: Not on file  Social Connections: Not on file   Additional Social History:    Allergies:  No Known Allergies  Labs:  Results for orders  placed or performed during the hospital encounter of 10/24/22 (from the past 48 hour(s))  CBG monitoring, ED     Status: Abnormal   Collection Time: 10/25/22  2:25 PM  Result Value Ref Range   Glucose-Capillary 141 (H) 70 - 99 mg/dL    Comment: Glucose reference range applies only to samples taken after fasting for at least 8 hours.  CBC     Status: Abnormal   Collection Time: 10/26/22  9:19 AM  Result Value Ref Range   WBC 6.2 4.0 - 10.5 K/uL   RBC 2.90 (L) 3.87 - 5.11 MIL/uL   Hemoglobin 9.2 (L) 12.0 - 15.0 g/dL   HCT 28.1 (L) 36.0 - 46.0 %   MCV 96.9 80.0 - 100.0 fL   MCH 31.7 26.0 - 34.0 pg   MCHC 32.7 30.0 - 36.0 g/dL   RDW 15.5 11.5 - 15.5 %   Platelets 167 150 - 400 K/uL   nRBC 0.0 0.0 - 0.2 %    Comment: Performed at [facility], [address] [place], [place] [address]  Renal function panel     Status: Abnormal   Collection Time: 10/26/22  9:19 AM  Result Value Ref Range   Sodium 141 135 - 145 mmol/L   Potassium 5.6 (H) 3.5 - 5.1 mmol/L   Chloride 100 98 - 111 mmol/L   CO2 27 22 - 32 mmol/L   Glucose, Bld 109 (H) 70 - 99 mg/dL    Comment: Glucose reference range applies only to  samples taken after fasting for at least 8 hours.   BUN 54 (H) 8 - 23 mg/dL   Creatinine, Ser 7.40 (H) 0.44 - 1.00 mg/dL   Calcium 8.8 (L) 8.9 - 10.3 mg/dL   Phosphorus 6.2 (H) 2.5 - 4.6 mg/dL   Albumin 2.8 (L) 3.5 - 5.0 g/dL   GFR, Estimated 5 (L) >60 mL/min    Comment: (NOTE) Calculated using the CKD-EPI Creatinine Equation (2021)    Anion gap 14 5 - 15    Comment: Performed at [facility], [address] [place], [place] [address]  Valproic acid level     Status: Abnormal   Collection Time: 10/26/22  9:19 AM  Result Value Ref Range   Valproic Acid Lvl <10 (L) 50.0 - 100.0 ug/mL    Comment: Performed at [facility], [address], [place], [place] [address]  Renal function panel     Status: Abnormal   Collection Time: 10/27/22  9:05 AM  Result Value Ref Range   Sodium 138 135 - 145 mmol/L   Potassium 5.1 3.5 - 5.1 mmol/L   Chloride 97 (L) 98 - 111 mmol/L   CO2 28 22 - 32 mmol/L   Glucose, Bld 101 (H) 70 - 99 mg/dL    Comment: Glucose reference range applies only to samples taken after fasting for at least 8 hours.   BUN 45 (H) 8 - 23 mg/dL   Creatinine, Ser 6.12 (H) 0.44 - 1.00 mg/dL   Calcium 9.6 8.9 - 10.3 mg/dL   Phosphorus 6.1 (H) 2.5 - 4.6 mg/dL   Albumin 3.5 3.5 - 5.0 g/dL   GFR, Estimated 7 (L) >60 mL/min    Comment: (NOTE) Calculated using the CKD-EPI Creatinine Equation (2021)    Anion gap 13 5 - 15    Comment: Performed at [facility], [address], [place], [place] [address]  CBC     Status: Abnormal   Collection Time: 10/27/22  9:05 AM  Result Value Ref Range   WBC 6.5 4.0 - 10.5 K/uL   RBC 3.48 (L) 3.87 - 5.11 MIL/uL   Hemoglobin 10.9 (L) 12.0 - 15.0 g/dL   HCT 35.3 (L) 36.0 - 46.0 %   MCV 101.4 (H) 80.0 - 100.0 fL   MCH 31.3 26.0 - 34.0 pg   MCHC 30.9 30.0 - 36.0 g/dL   RDW 15.6 (H) 11.5 - 15.5 %   Platelets 212 150 - 400 K/uL   nRBC 0.0 0.0 - 0.2 %    Comment: Performed at [facility], [address], [place], [place] [address]    Current Facility-Administered Medications  Medication Dose Route Frequency Provider Last Rate Last Admin   acetaminophen (TYLENOL) tablet 650 mg  650 mg Oral TID Lucillie Garfinkel, MD   650 mg at 10/27/22 1751   alteplase (CATHFLO ACTIVASE) injection 2 mg  2 mg Intracatheter Once PRN Lateef, Munsoor, MD       Chlorhexidine Gluconate Cloth 2 % PADS 6 each  6 each Topical Q0600 Lateef, Munsoor, MD   6 each at 10/26/22 0542   epoetin alfa (EPOGEN) injection 10,000 Units  10,000 Units Intravenous Q M,W,F-HD Lucillie Garfinkel, MD   10,000 Units at 10/26/22 1149   heparin injection 1,000 Units  1,000 Units Dialysis PRN Lateef, Munsoor, MD       lidocaine (PF) (XYLOCAINE) 1 % injection 5 mL  5 mL Intradermal PRN Lateef, Munsoor, MD       lidocaine-prilocaine (EMLA) cream 1 Application  1 Application Topical PRN Lateef, Munsoor, MD       meloxicam (MOBIC) tablet 7.5 mg  7.5 mg Oral Daily Lucillie Garfinkel, MD   7.5 mg at 10/27/22 0845   midodrine (PROAMATINE) tablet 15 mg  15 mg Oral Q M,W,F-HD Lucillie Garfinkel, MD       OLANZapine [facility]) tablet 5 mg  5 mg Oral BID Patrecia Pour, NP   5 mg at 10/27/22 0831   Or   OLANZapine (ZYPREXA) injection 5 mg  5 mg Intramuscular BID Patrecia Pour, NP   5 mg at 10/26/22 2127   pantoprazole (PROTONIX) EC tablet 40 mg  40 mg Oral Daily Lucillie Garfinkel, MD   40 mg at 10/27/22 0830   pentafluoroprop-tetrafluoroeth (GEBAUERS) aerosol 1 Application  1 Application Topical PRN Lateef, Munsoor, MD       sodium zirconium cyclosilicate (LOKELMA) packet 10 g  10 g Oral Daily Lucillie Garfinkel, MD   10 g at 10/27/22 1014   valproic acid (DEPAKENE) 250 MG/5ML solution 500 mg  500 mg Oral TID WC Patrecia Pour, NP   500 mg at 10/27/22 0258   Current Outpatient Medications  Medication Sig Dispense Refill   ABILIFY MAINTENA 300 MG PRSY prefilled syringe Inject 300 mg into the muscle every 30 (thirty) days.     acetaminophen (TYLENOL) 325 MG tablet Take 650 mg by mouth 3  (three) times daily.     ARIPiprazole (ABILIFY) 5 MG tablet Take 5 mg by mouth daily.     benztropine (COGENTIN) 1 MG tablet Take 1 mg by mouth 2 (two) times daily.     Meloxicam 5 MG CAPS Take 5 mg by mouth daily.     midodrine (PROAMATINE) 10 MG tablet Take 15 mg by mouth every Monday, Wednesday, and Friday with hemodialysis.     pantoprazole (PROTONIX) 40 MG tablet TAKE 1 TABLET BY MOUTH ONCE DAILY. 90 tablet 3   risperiDONE (RISPERDAL) 1  MG/ML oral solution Take 1 mg by mouth 3 (three) times daily.     sodium zirconium cyclosilicate (LOKELMA) 10 g PACK packet Take 10 g by mouth.     valproic acid (DEPAKENE) 250 MG/5ML solution Take 250 mg by mouth 3 (three) times daily with meals.     ALPRAZolam (XANAX) 0.25 MG tablet Take 1 tablet (0.25 mg total) by mouth 3 (three) times daily as needed for anxiety. (Patient not taking: Reported on 10/25/2022) 30 tablet 0   aspirin EC 81 MG tablet Take 81 mg by mouth daily. Swallow whole. (Patient not taking: Reported on 10/25/2022)     B Complex-C-Folic Acid (DIALYVITE TABLET) TABS Take 1 tablet by mouth daily. (Patient not taking: Reported on 10/25/2022)     docusate sodium (COLACE) 100 MG capsule Take 1 capsule (100 mg total) by mouth 2 (two) times daily. (Patient not taking: Reported on 10/25/2022) 10 capsule 0   DULoxetine (CYMBALTA) 20 MG capsule Take 1 capsule (20 mg total) by mouth daily. (Patient not taking: Reported on 10/25/2022)  3   epoetin alfa (EPOGEN,PROCRIT) 82423 UNIT/ML injection Inject 1 mL (10,000 Units total) into the vein every Monday, Wednesday, and Friday with hemodialysis. 1 mL 1   ferric citrate (AURYXIA) 1 GM 210 MG(Fe) tablet Take 210 mg by mouth 3 (three) times daily with meals.  (Patient not taking: Reported on 10/25/2022)     gabapentin (NEURONTIN) 100 MG capsule Take 1 capsule (100 mg total) by mouth 2 (two) times daily. (Patient not taking: Reported on 10/25/2022) 60 capsule 1   loperamide (IMODIUM) 2 MG capsule Take by mouth as needed for  diarrhea or loose stools. (Patient not taking: Reported on 10/25/2022)     LORazepam (ATIVAN) 0.5 MG tablet Take 0.5 mg by mouth every 8 (eight) hours. (Patient not taking: Reported on 10/25/2022)     senna (SENOKOT) 8.6 MG TABS tablet Take 1 tablet by mouth. (Patient not taking: Reported on 10/25/2022)     thiamine (VITAMIN B-1) 100 MG tablet Take 0.5 tablets (50 mg total) by mouth daily. (Patient not taking: Reported on 10/25/2022) 30 tablet 0    Musculoskeletal: Strength & Muscle Tone: decreased Gait & Station:  Did not observe Patient leans: N/A  Psychiatric Specialty Exam: Physical Exam HENT:     Head: Normocephalic.     Nose: Nose normal.  Pulmonary:     Effort: Pulmonary effort is normal.  Musculoskeletal:     Cervical back: Normal range of motion.     Comments: Generalized weakness  Neurological:     Mental Status: She is alert.  Psychiatric:        Attention and Perception: She is inattentive.        Mood and Affect: Mood is anxious. Affect is blunt.        Speech: Speech normal.        Behavior: Behavior normal. Behavior is cooperative.        Thought Content: Thought content normal.        Cognition and Memory: Cognition is impaired. Memory is impaired.        Judgment: Judgment normal.     Review of Systems  Constitutional: Negative.   HENT: Negative.    Eyes: Negative.   Respiratory: Negative.    Cardiovascular: Negative.   Gastrointestinal: Negative.   Genitourinary: Negative.   Musculoskeletal: Negative.   Skin: Negative.   Neurological: Negative.   Psychiatric/Behavioral:  Positive for memory loss. The patient is nervous/anxious.  Restlessness     Blood pressure 118/77, pulse 95, temperature 98.3 F (36.8 C), temperature source Oral, resp. rate 17, weight 52 kg, SpO2 100 %.Body mass index is 19.68 kg/m.  General Appearance: Casual  Eye Contact:  Good  Speech:  Normal Rate  Volume:  Normal  Mood:  Anxious  Affect:  Blunt  Thought Process:   Irrelevant  Orientation:  Other:  person  Thought Content:  Rumination  Suicidal Thoughts:  No  Homicidal Thoughts:  No  Memory:  Immediate;   Poor Recent;   Poor Remote;   Poor  Judgement:  Impaired  Insight:  Lacking  Psychomotor Activity:  Normal  Concentration:  Concentration: Fair and Attention Span: Fair  Recall:  [employer] of Knowledge:  Fair  Language:  Good  Akathisia:  No  Handed:  Right  AIMS (if indicated):     Assets:  Leisure Time Resilience Social Support  ADL's:  Intact  Cognition:  Impaired,  Moderate  Sleep:         Physical Exam: Physical Exam HENT:     Head: Normocephalic.     Nose: Nose normal.  Pulmonary:     Effort: Pulmonary effort is normal.  Musculoskeletal:     Cervical back: Normal range of motion.     Comments: Generalized weakness  Neurological:     Mental Status: She is alert.  Psychiatric:        Attention and Perception: She is inattentive.        Mood and Affect: Mood is anxious. Affect is blunt.        Speech: Speech normal.        Behavior: Behavior normal. Behavior is cooperative.        Thought Content: Thought content normal.        Cognition and Memory: Cognition is impaired. Memory is impaired.        Judgment: Judgment normal.    Review of Systems  Constitutional: Negative.   HENT: Negative.    Eyes: Negative.   Respiratory: Negative.    Cardiovascular: Negative.   Gastrointestinal: Negative.   Genitourinary: Negative.   Musculoskeletal: Negative.   Skin: Negative.   Neurological: Negative.   Endo/Heme/Allergies: Negative.   Psychiatric/Behavioral:  Positive for memory loss. The patient is nervous/anxious.        Restlessness    Blood pressure 118/77, pulse 95, temperature 98.3 F (36.8 C), temperature source Oral, resp. rate 17, weight 52 kg, SpO2 100 %. Body mass index is 19.68 kg/m.  Treatment Plan Summary: Daily contact with patient to assess and evaluate symptoms and progress in treatment and  Medication management: Schizoaffective disorder, bipolar type: Depakote 500 mg TID Zyprexa 5 mg BID oral or IM Valproic acid level ordered TSH level ordered  Disposition: TOC for a higher level of care  Waylan Boga, NP 10/27/2022 10:41 AM

## 2022-10-27 NOTE — ED Notes (Signed)
Dinner meal tray given at this time.  

## 2022-10-27 NOTE — ED Notes (Signed)
Pt given dinner tray. This tech attempted to ask Pt what she would like to drink, however Pt is unable to answer questions and has flight of ideas. Pt is rambling about multiple topics and is not redirectable.

## 2022-10-27 NOTE — ED Notes (Signed)
Patient sitting in the recliner, talking to herself, saying " My sister should be locked up, because she keeps doing this to me" Nurse talked to Patient and she states " I can't get my thoughts together" Patient is pleasant, and cooperative, she does have to be coaxed to take medications, she stating that she has already had them earlier, Staff will continue to monitor for safety.

## 2022-10-27 NOTE — ED Notes (Signed)
Report received from [place], [place]

## 2022-10-27 NOTE — ED Notes (Addendum)
Pt ambulatory to bathroom at this time. Pt wandering at this time in the hallway but easily redirectable.

## 2022-10-28 ENCOUNTER — Other Ambulatory Visit: Payer: Self-pay | Admitting: Psychiatry

## 2022-10-28 DIAGNOSIS — F250 Schizoaffective disorder, bipolar type: Secondary | ICD-10-CM | POA: Diagnosis not present

## 2022-10-28 NOTE — ED Notes (Signed)
VOL/pending Geropsych placement

## 2022-10-28 NOTE — ED Notes (Signed)
Toothbrush, toothpaste, and cup properly disposed of by this RN.

## 2022-10-28 NOTE — ED Notes (Signed)
Pt given snack and two cups of ice, pt refuses to drink water

## 2022-10-28 NOTE — ED Notes (Signed)
Medications had to be mixed into applesauce for patient to take. Pt very hesitant to take medication, but eventually complied when provided with ice chips.

## 2022-10-28 NOTE — ED Notes (Signed)
Report given to Annie RN 

## 2022-10-28 NOTE — ED Notes (Addendum)
Pt given toothbrush,toothpaste, and cup to brush her teeth per her request.

## 2022-10-28 NOTE — ED Notes (Signed)
Pt assisted with taking all meds. Pt redirected and able to swallow all meds. Pt eating breakfast.

## 2022-10-28 NOTE — ED Notes (Signed)
Pt given dinner tray and drink at this time.

## 2022-10-28 NOTE — ED Notes (Signed)
Pt given lunch tray.

## 2022-10-28 NOTE — ED Provider Notes (Signed)
-----------------------------------------   6:11 AM on 10/28/2022 -----------------------------------------   Blood pressure 127/75, pulse 78, temperature 99.5 F (37.5 C), temperature source Oral, resp. rate 17, weight 52 kg, SpO2 98 %.  The patient is calm and cooperative at this time.  There have been no acute events since the last update.  Awaiting disposition plan from [facility] team.   Hinda Kehr, MD 10/28/22 [phone]

## 2022-10-28 NOTE — ED Notes (Signed)
Pt given toothbrush and toothpaste at this time. Pt assisted in bathroom. Pt back in bed at this time. Pt continues to remain confused but is easily redirectable.

## 2022-10-28 NOTE — ED Notes (Signed)
Attempted to give pt medication. Pt becoming agitated and more confused. Pt informed we will give it to her later so she can rest right now. Pt resting comfortably in the bed.

## 2022-10-28 NOTE — ED Notes (Signed)
Pt given breakfast tray at this time. 

## 2022-10-28 NOTE — ED Notes (Signed)
VOL pending geropsych placement

## 2022-10-29 ENCOUNTER — Emergency Department: Payer: Medicare Other

## 2022-10-29 DIAGNOSIS — F250 Schizoaffective disorder, bipolar type: Secondary | ICD-10-CM | POA: Diagnosis not present

## 2022-10-29 LAB — CBC WITH DIFFERENTIAL/PLATELET
Abs Immature Granulocytes: 0.02 10*3/uL (ref 0.00–0.07)
Basophils Absolute: 0.1 10*3/uL (ref 0.0–0.1)
Basophils Relative: 1 %
Eosinophils Absolute: 0.1 10*3/uL (ref 0.0–0.5)
Eosinophils Relative: 2 %
HCT: 34.2 % — ABNORMAL LOW (ref 36.0–46.0)
Hemoglobin: 11 g/dL — ABNORMAL LOW (ref 12.0–15.0)
Immature Granulocytes: 0 %
Lymphocytes Relative: 14 %
Lymphs Abs: 1 10*3/uL (ref 0.7–4.0)
MCH: 31.7 pg (ref 26.0–34.0)
MCHC: 32.2 g/dL (ref 30.0–36.0)
MCV: 98.6 fL (ref 80.0–100.0)
Monocytes Absolute: 0.5 10*3/uL (ref 0.1–1.0)
Monocytes Relative: 7 %
Neutro Abs: 5.4 10*3/uL (ref 1.7–7.7)
Neutrophils Relative %: 76 %
Platelets: 241 10*3/uL (ref 150–400)
RBC: 3.47 MIL/uL — ABNORMAL LOW (ref 3.87–5.11)
RDW: 15.6 % — ABNORMAL HIGH (ref 11.5–15.5)
WBC: 7.1 10*3/uL (ref 4.0–10.5)
nRBC: 0 % (ref 0.0–0.2)

## 2022-10-29 LAB — BASIC METABOLIC PANEL
Anion gap: 16 — ABNORMAL HIGH (ref 5–15)
BUN: 31 mg/dL — ABNORMAL HIGH (ref 8–23)
CO2: 26 mmol/L (ref 22–32)
Calcium: 9.4 mg/dL (ref 8.9–10.3)
Chloride: 92 mmol/L — ABNORMAL LOW (ref 98–111)
Creatinine, Ser: 4.39 mg/dL — ABNORMAL HIGH (ref 0.44–1.00)
GFR, Estimated: 10 mL/min — ABNORMAL LOW (ref >60)
Glucose, Bld: 92 mg/dL (ref 70–99)
Potassium: 3.8 mmol/L (ref 3.5–5.1)
Sodium: 134 mmol/L — ABNORMAL LOW (ref 135–145)

## 2022-10-29 LAB — VALPROIC ACID LEVEL: Valproic Acid Lvl: 23 ug/mL — ABNORMAL LOW (ref 50.0–100.0)

## 2022-10-29 MED ORDER — ACETAMINOPHEN 325 MG PO TABS
ORAL_TABLET | ORAL | Status: AC
  Filled 2022-10-29: qty 2

## 2022-10-29 MED ORDER — DICLOFENAC SODIUM 1 % EX GEL
4.0000 g | Freq: Three times a day (TID) | CUTANEOUS | Status: DC
  Administered 2022-10-29 – 2022-10-30 (×3): 4 g via TOPICAL
  Filled 2022-10-29: qty 100

## 2022-10-29 MED ORDER — OXYCODONE HCL 5 MG PO TABS
2.5000 mg | ORAL_TABLET | Freq: Four times a day (QID) | ORAL | Status: DC | PRN
  Administered 2022-10-29: 2.5 mg via ORAL
  Filled 2022-10-29: qty 1

## 2022-10-29 NOTE — ED Notes (Signed)
Pt did go to the bathroom and had a bowel movement. Is resting again.

## 2022-10-29 NOTE — TOC Progression Note (Signed)
Transition of Care [facility]) - Progression Note    Patient Details  Name: Madison [name] MRN: [id number] Date of Birth: 12-12-1950  Transition of Care [facility]) CM/SW Contact  Shelbie Hutching, RN Phone Number: 10/29/2022, 3:44 PM  Clinical Narrative:    RNCM spoke with Levada Dy [phone] at [facility].  Discussed patient returning as she has been psychiatrically cleared and functionally at baseline.  Levada Dy will need to come and assess patient tomorrow, she says she can be here in the morning around 10.   Called patient's sister [name], and left a message for return call.         Expected Discharge Plan and Services                                               Social Determinants of Health (SDOH) Interventions SDOH Screenings   Alcohol Screen: Low Risk  (12/25/2018)  Tobacco Use: Low Risk  (09/20/2022)    Readmission Risk Interventions     No data to display

## 2022-10-29 NOTE — TOC Initial Note (Signed)
Transition of Care [facility]) - Initial/Assessment Note    Patient Details  Name: LAYANNA CHARO MRN: [id number] Date of Birth: 06-Nov-1950  Transition of Care [facility]) CM/SW Contact:    Shelbie Hutching, RN Phone Number: 10/29/2022, 9:04 AM  Clinical Narrative:                 Psych cleared patient.  Asked MD to order PT and OT evals to see patient's functional ability.  If patient is at baseline mobility will ask [facility] to accept patient back.         Patient Goals and CMS Choice            Expected Discharge Plan and Services                                              Prior Living Arrangements/Services                       Activities of Daily Living      Permission Sought/Granted                  Emotional Assessment              Admission diagnosis:  beh med eval Patient Active Problem List   Diagnosis Date Noted   Murmur, cardiac 09/20/2022   Encounter for screening fecal occult blood testing 10/31/2021   Encounter for gynecological examination 92/33/0076   Acute metabolic encephalopathy 22/63/3354   Rhabdomyolysis 04/06/2020   Elevated troponin 04/06/2020   Pneumonia 04/06/2020   Lactic acidosis 04/06/2020   Suspected Sepsis ([place]) 04/06/2020   Possible NSTEMI (non-ST elevated myocardial infarction) ([place]) 04/06/2020    Suspected Overdose, undetermined intent, initial encounter 04/06/2020   Schizoaffective disorder, bipolar type ([place])    Bipolar affective disorder, current episode manic with psychotic symptoms ([place]) 12/25/2018   ESRD on hemodialysis ([place]) 12/25/2018   History of adenomatous polyp of colon 11/20/2018   Loss of weight 10/17/2017   Hypernatremia 04/17/2016   CKD (chronic kidney disease) stage 3, GFR 30-59 ml/min (HCC) 04/16/2016   Normocytic anemia 04/16/2016   Meningioma ([place]) 04/16/2016   Acute psychosis ([place]) 04/15/2016   Hyponatremia 04/15/2016   AKI (acute kidney injury) ([place]) 04/15/2016   Special  screening for malignant neoplasms, colon    Thrombocytopenia ([place]) 01/05/2016   Altered mental status 04/19/2015   PCP:  Vidal Schwalbe, MD Pharmacy:   [place], [place] - [address] [place] [address] Phone: [phone] Fax: [place] [address] Phone: [phone] Fax: [place], [place] [address] [place] [address] Phone: [phone] Fax: [phone]     Social Determinants of Health (SDOH) Social History: SDOH Screenings   Alcohol Screen: Low Risk  (12/25/2018)  Tobacco Use: Low Risk  (09/20/2022)   SDOH Interventions:     Readmission Risk Interventions     No data to display

## 2022-10-29 NOTE — Progress Notes (Signed)
Received patient in bed to unit.  Alert and oriented.  Informed consent signed and in chart.   TX duration: 3.28 hours  Patient tolerated well.  Transported back to the room  Alert, without acute distress.  Hand-off given to patient's nurse.   Access used: Left AVF Access issues:  High Venous pressure  Total UF removed: 1.8L Medication(s) given: Tylenol    [facility]

## 2022-10-29 NOTE — ED Notes (Signed)
Patient still in dialysis .

## 2022-10-29 NOTE — ED Provider Notes (Signed)
-----------------------------------------   3:27 AM on 10/29/2022 -----------------------------------------   Blood pressure (!) 142/88, pulse (!) 115, temperature 97.7 F (36.5 C), temperature source Oral, resp. rate 20, weight 52 kg, SpO2 98 %.  The patient is calm and cooperative at this time.  There have been no acute events since the last update.  Awaiting disposition plan from case management/social work.    Cloys Vera, Delice [place], DO 10/29/22 [phone]

## 2022-10-29 NOTE — ED Notes (Signed)
Patient back from dialysis with tachycardia, no fever (HR has steadily increased since yesterday). Patient c/o pain to left knee despite Tylenol given before coming back from dialysis. Left knee noted to be swollen, but not warm to touch. Patient unable to confirm or deny whether this is normal occurrence for her or if she has arthritis in that knee. Patient states they normally put something on her knee at the group home. Dr. Ellender Hose aware.

## 2022-10-29 NOTE — ED Notes (Signed)
Pt care taken, no complaints at this time, pt is resting.

## 2022-10-29 NOTE — ED Notes (Signed)
Hospital meal provided.  100% consumed, pt tolerated w/o complaints.  Waste discarded appropriately.   

## 2022-10-29 NOTE — ED Notes (Signed)
Vol /pending placement 

## 2022-10-29 NOTE — Progress Notes (Signed)
PT Cancellation Note  Patient Details Name: GREER WAINRIGHT MRN: [id number] DOB: 16-May-1951   Cancelled Treatment:    Reason Eval/Treat Not Completed: Patient at procedure or test/unavailable.  Nurse reports pt currently at dialysis.  Will re-attempt PT evaluation at a later date/time.  Leitha Bleak, PT 10/29/22, 11:41 AM

## 2022-10-29 NOTE — Progress Notes (Signed)
[facility]  ROUNDING NOTE   Subjective:   Madison [name] is a 72 y.o. female with past medical history of schizophrenia and end-stage renal disease on hemodialysis.  Patient presents to the emergency department complaining of leg pain however caregiver state that due to patient's mental status, she has not been taking her medications or going to scheduled dialysis treatments.  Patient is known to our practice from previous admissions and receives outpatient dialysis treatments at [facility] on a MWF schedule, supervised by [facility] physicians.   Patient seen and evaluated during dialysis   HEMODIALYSIS FLOWSHEET:  Blood Flow Rate (mL/min): 400 mL/min Arterial Pressure (mmHg): -150 mmHg Venous Pressure (mmHg): 260 mmHg TMP (mmHg): 13 mmHg Ultrafiltration Rate (mL/min): 686 mL/min Dialysate Flow Rate (mL/min): 300 ml/min Dialysis Fluid Bolus: Normal Saline Bolus Amount (mL): 300 mL  Patient receiving treatment today seated in chair Alert with fluctuating temperament Pleasant at times  Objective:  Vital signs in last 24 hours:  Temp:  [97.7 F (36.5 C)-98.4 F (36.9 C)] 98.4 F (36.9 C) (02/05 0948) Pulse Rate:  [85-115] 91 (02/05 1100) Resp:  [16-22] 22 (02/05 1100) BP: (122-158)/(43-98) 135/98 (02/05 1100) SpO2:  [98 %-100 %] 100 % (02/05 1100) Weight:  [64.9 kg] 64.9 kg (02/05 0948)  Weight change:  Filed Weights   10/26/22 0905 10/26/22 1247 10/29/22 0948  Weight: 60.4 kg 52 kg 64.9 kg    Intake/Output: No intake/output data recorded.   Intake/Output this shift:  No intake/output data recorded.  Physical Exam: General: NAD  Head: Normocephalic, atraumatic. Moist oral mucosal membranes  Eyes: Anicteric  Lungs:  Clear to auscultation, normal effort, room air  Heart: Regular rate and rhythm  Abdomen:  Soft, nontender, nondistended  Extremities:  No peripheral edema.  Neurologic: Alert, moving all four extremities  Skin: No lesions  Access: Lt AVF     Basic Metabolic Panel: Recent Labs  Lab 10/24/22 1139 10/25/22 1008 10/26/22 0919 10/27/22 0905  NA 139 139 141 138  K 5.2* 6.1* 5.6* 5.1  CL 96* 97* 100 97*  CO2 '28 27 27 28  '$ GLUCOSE 111* 134* 109* 101*  BUN 64* 77* 54* 45*  CREATININE 8.69* 10.35* 7.40* 6.12*  CALCIUM 9.6 9.3 8.8* 9.6  PHOS  --  8.0* 6.2* 6.1*     Liver Function Tests: Recent Labs  Lab 10/24/22 1139 10/25/22 1008 10/26/22 0919 10/27/22 0905  AST 16  --   --   --   ALT 10  --   --   --   ALKPHOS 75  --   --   --   BILITOT 0.7  --   --   --   PROT 7.2  --   --   --   ALBUMIN 3.6 2.9* 2.8* 3.5    No results for input(s): "LIPASE", "AMYLASE" in the last 168 hours. No results for input(s): "AMMONIA" in the last 168 hours.  CBC: Recent Labs  Lab 10/24/22 1139 10/25/22 1008 10/26/22 0919 10/27/22 0905  WBC 5.8 5.2 6.2 6.5  HGB 10.3* 8.8* 9.2* 10.9*  HCT 32.4* 26.9* 28.1* 35.3*  MCV 97.9 97.8 96.9 101.4*  PLT 194 166 167 212     Cardiac Enzymes: No results for input(s): "CKTOTAL", "CKMB", "CKMBINDEX", "TROPONINI" in the last 168 hours.  BNP: Invalid input(s): "POCBNP"  CBG: Recent Labs  Lab 10/25/22 [place]*     Microbiology: Results for orders placed or performed during the hospital encounter of 10/24/22  Resp  panel by RT-PCR (RSV, Flu A&B, Covid) Anterior Nasal Swab     Status: None   Collection Time: 10/24/22  3:04 PM   Specimen: Anterior Nasal Swab  Result Value Ref Range Status   SARS Coronavirus 2 by RT PCR NEGATIVE NEGATIVE Final    Comment: (NOTE) SARS-CoV-2 target nucleic acids are NOT DETECTED.  The SARS-CoV-2 RNA is generally detectable in upper respiratory specimens during the acute phase of infection. The lowest concentration of SARS-CoV-2 viral copies this assay can detect is 138 copies/mL. A negative result does not preclude SARS-Cov-2 infection and should not be used as the sole basis for treatment or other patient management decisions. A  negative result may occur with  improper specimen collection/handling, submission of specimen other than nasopharyngeal swab, presence of viral mutation(s) within the areas targeted by this assay, and inadequate number of viral copies(<138 copies/mL). A negative result must be combined with clinical observations, patient history, and epidemiological information. The expected result is Negative.  Fact Sheet for Patients:  EntrepreneurPulse.com.au  Fact Sheet for Healthcare Providers:  IncredibleEmployment.be  This test is no t yet approved or cleared by the [place] FDA and  has been authorized for detection and/or diagnosis of SARS-CoV-2 by FDA under an Emergency Use Authorization (EUA). This EUA will remain  in effect (meaning this test can be used) for the duration of the COVID-19 declaration under Section 564(b)(1) of the Act, 21 U.S.C.section 360bbb-3(b)(1), unless the authorization is terminated  or revoked sooner.       Influenza A by PCR NEGATIVE NEGATIVE Final   Influenza B by PCR NEGATIVE NEGATIVE Final    Comment: (NOTE) The Xpert Xpress SARS-CoV-2/FLU/RSV plus assay is intended as an aid in the diagnosis of influenza from Nasopharyngeal swab specimens and should not be used as a sole basis for treatment. Nasal washings and aspirates are unacceptable for Xpert Xpress SARS-CoV-2/FLU/RSV testing.  Fact Sheet for Patients: EntrepreneurPulse.com.au  Fact Sheet for Healthcare Providers: IncredibleEmployment.be  This test is not yet approved or cleared by the [place] FDA and has been authorized for detection and/or diagnosis of SARS-CoV-2 by FDA under an Emergency Use Authorization (EUA). This EUA will remain in effect (meaning this test can be used) for the duration of the COVID-19 declaration under Section 564(b)(1) of the Act, 21 U.S.C. section 360bbb-3(b)(1), unless the authorization  is terminated or revoked.     Resp Syncytial Virus by PCR NEGATIVE NEGATIVE Final    Comment: (NOTE) Fact Sheet for Patients: EntrepreneurPulse.com.au  Fact Sheet for Healthcare Providers: IncredibleEmployment.be  This test is not yet approved or cleared by the [place] FDA and has been authorized for detection and/or diagnosis of SARS-CoV-2 by FDA under an Emergency Use Authorization (EUA). This EUA will remain in effect (meaning this test can be used) for the duration of the COVID-19 declaration under Section 564(b)(1) of the Act, 21 U.S.C. section 360bbb-3(b)(1), unless the authorization is terminated or revoked.  Performed at [facility], [address] [place], [place] [address]     Coagulation Studies: No results for input(s): "LABPROT", "INR" in the last 72 hours.  Urinalysis: No results for input(s): "COLORURINE", "LABSPEC", "PHURINE", "GLUCOSEU", "HGBUR", "BILIRUBINUR", "KETONESUR", "PROTEINUR", "UROBILINOGEN", "NITRITE", "LEUKOCYTESUR" in the last 72 hours.  Invalid input(s): "APPERANCEUR"    Imaging: No results found.   Medications:     acetaminophen  650 mg Oral TID   Chlorhexidine Gluconate Cloth  6 each Topical Q0600   midodrine  15 mg Oral Q M,W,F-HD  OLANZapine  5 mg Oral BID   Or   OLANZapine  5 mg Intramuscular BID   pantoprazole  40 mg Oral Daily   sodium zirconium cyclosilicate  10 g Oral Daily   valproic acid  500 mg Oral TID WC   alteplase, heparin, lidocaine (PF), lidocaine-prilocaine, pentafluoroprop-tetrafluoroeth  Assessment/ Plan:  [name] is a 72 y.o.  female with past medical history of schizophrenia and end-stage renal disease on hemodialysis.  Patient presents to the emergency department complaining of leg pain however caregiver state that due to patient's mental status, she has not been taking her medications or going to scheduled dialysis treatments.  Hyperkalemia  with end-stage renal disease on hemodialysis.  Latest potassium 5.1.  Will correct this with dialysis.  Receiving dialysis seated in chair, UF goal 1.5 L as tolerated.  Next treatment scheduled for Wednesday.  Renal navigator currently monitoring discharge planning to determine outpatient needs.  2. Anemia of chronic kidney disease Lab Results  Component Value Date   HGB 10.9 (L) 10/27/2022   Received Mircera outpatient.  Hemoglobin remains at goal.   3. Secondary Hyperparathyroidism: with outpatient labs: PTH 408, phosphorus 5.9, calcium 9.6 on 10/03/22.    Lab Results  Component Value Date   PTH 424 (H) 10/25/2022   CALCIUM 9.6 10/27/2022   PHOS 6.1 (H) 10/27/2022  Currently prescribed Auryxia, cinacalcet and calcitriol outpatient. Phosphorus elevated today. Will monitor for now    LOS: 0   2/5/202412:01 PM

## 2022-10-29 NOTE — Progress Notes (Signed)
OT Cancellation Note  Patient Details Name: Madison [name] MRN: [id number] DOB: 1951/03/23   Cancelled Treatment:    Reason Eval/Treat Not Completed: Patient at procedure or test/ unavailable. Imminent DC order received, chart reviewed. Attempted x2 to see pt for OT evaluation this pm, however, pt OTF for HD. Will hold at this time and re-attempt at a later date/time as available and pt medically appropriate for OT services.   Shara Blazing, M.S., OTR/L 10/29/22, 2:12 PM

## 2022-10-29 NOTE — ED Notes (Signed)
Pt standing at the door saying that she had an accident on herself, provided a clean change of clothes, helped her get cleaned up and made sure the bed was dry, put patient back in bed and she went back to sleep.

## 2022-10-29 NOTE — Progress Notes (Signed)
1215: Treatment paused due to air in blue line. Cartridge changed.

## 2022-10-29 NOTE — Progress Notes (Signed)
PT Cancellation Note  Patient Details Name: RAMATA STROTHMAN MRN: [id number] DOB: Jul 17, 1951   Cancelled Treatment:    Reason Eval/Treat Not Completed: Patient at procedure or test/unavailable.  PT consult received.  Chart reviewed.  Attempted to see pt at 1400 but pt not in ED room (pt still off floor at dialysis).  Will re-attempt PT session at a later date/time.  Leitha Bleak, PT 10/29/22, 2:07 PM

## 2022-10-29 NOTE — ED Provider Notes (Signed)
I was asked to evaluate pt due to knee pain and tachycardia. Pt has known OA in this knee and exam is c/w osteo but no signs of septic or inflammatory arthritis. Given low-dose oxy for pain with improvement and will order the voltaren she normally uses. HR improved. Repeat labs obtained and are reassuring.    Duffy Bruce, MD 10/29/22 [phone]

## 2022-10-29 NOTE — ED Notes (Signed)
Patient to dialysis.

## 2022-10-30 DIAGNOSIS — F250 Schizoaffective disorder, bipolar type: Secondary | ICD-10-CM | POA: Diagnosis not present

## 2022-10-30 MED ORDER — OLANZAPINE 5 MG PO TABS: 5.0000 mg | ORAL_TABLET | Freq: Two times a day (BID) | ORAL | 0 refills | Status: DC

## 2022-10-30 NOTE — TOC Transition Note (Signed)
Transition of Care [facility]) - CM/SW Discharge Note   Patient Details  Name: Madison [name] MRN: [id number] Date of Birth: 1951-04-11  [facility] Care [facility]) CM/SW Contact:  Shelbie Hutching, RN Phone Number: 10/30/2022, 2:37 PM   Clinical Narrative:    After visit summary faxed over to [facility] [phone].  Levada Dy over at [facility] says they will send someone to pick patient up. [facility] appeal closed.  RNCM attempted to call both sisters, [name] and Jamie Kato, neither answered, messages left for both stating that patient would be discharged back to [facility] as previously discussed.     Final next level of care: Assisted Living Barriers to Discharge: Barriers Resolved   Patient Goals and CMS Choice      Discharge Placement                Patient chooses bed at:  [facility]) Patient to be transferred to facility by: [facility] coming to get patient Name of family member notified: [name] Patient and family notified of of transfer: 10/30/22  Discharge Plan and Services Additional resources added to the After Visit Summary for     Discharge Planning Services: CM Consult            DME Arranged: N/A DME Agency: NA       HH Arranged: NA HH Agency: NA        Social Determinants of Health ([place]) Interventions SDOH Screenings   Alcohol Screen: Low Risk  (12/25/2018)  Tobacco Use: Low Risk  (09/20/2022)     Readmission Risk Interventions     No data to display

## 2022-10-30 NOTE — TOC Progression Note (Signed)
Transition of Care [facility]) - Progression Note    Patient Details  Name: Madison [name] MRN: [id number] Date of Birth: 07/04/1951  Transition of Care [facility]) CM/SW Contact  Shelbie Hutching, RN Phone Number: 10/30/2022, 10:39 AM  Clinical Narrative:    Levada Dy from [facility] came to see patient this morning.  Levada Dy says that this is the Janari that they are used to and she can return.   Called to speak with patient's sisters.  They are hesitant to let the patient return, they will discuss and get back to me.  [facility] can pick the patient up.          Expected Discharge Plan and Services                                               Social Determinants of Health (SDOH) Interventions SDOH Screenings   Alcohol Screen: Low Risk  (12/25/2018)  Tobacco Use: Low Risk  (09/20/2022)    Readmission Risk Interventions     No data to display

## 2022-10-30 NOTE — ED Notes (Signed)
Pt requesting her AM meds now.

## 2022-10-30 NOTE — ED Notes (Signed)
Report to [place], RN

## 2022-10-30 NOTE — Evaluation (Signed)
Physical Therapy Evaluation Patient Details Name: Madison [name] MRN: [id number] DOB: [birth year] Today's Date: 10/30/2022  History of Present Illness  Pt is a 72 y.o. female presenting to hospital 10/24/22 with c/o AMS and confusion (pt not taking psychiatric medication past 3 months; refusing to go to dialysis last week); c/o pain to legs and back.  Clinical Impression  Prior to ED visit, pt appears to have been ambulatory; lives at a care home.  Nurse reports pt independently took a shower and got dressed this morning.  Pt perseverating on needing breakfast with her medications (nurse aware) and appearing anxious throughout session requiring redirection.  Currently pt is independent with bed mobility; SBA with transfers; and CGA progressing to SBA ambulating 300 feet (no AD use).  Pt mildly shaky during ambulation but no loss of balance noted (pt reporting B LE pain and generalized LE weakness during ambulation).  Pt would benefit from skilled PT to address noted impairments and functional limitations during hospitalization (see below for any additional details).  Upon hospital discharge, no further PT needs anticipated (TOC notified).    Recommendations for follow up therapy are one component of a multi-disciplinary discharge planning process, led by the attending physician.  Recommendations may be updated based on patient status, additional functional criteria and insurance authorization.  Follow Up Recommendations No PT follow up      Assistance Recommended at Discharge PRN  Patient can return home with the following  Assistance with cooking/housework;Direct supervision/assist for medications management;Direct supervision/assist for financial management;Assist for transportation;Help with stairs or ramp for entrance    Equipment Recommendations None recommended by PT  Recommendations for Other Services       Functional Status Assessment Patient has had a recent decline in their functional  status and/or demonstrates limited ability to make significant improvements in function in a reasonable and predictable amount of time     Precautions / Restrictions Precautions Precautions: Fall Precaution Comments: L UE AV fistula Restrictions Weight Bearing Restrictions: No      Mobility  Bed Mobility Overal bed mobility: Independent             General bed mobility comments: Supine to/from sitting without any noted difficulties    Transfers Overall transfer level: Needs assistance Equipment used: None Transfers: Sit to/from Stand Sit to Stand: Supervision           General transfer comment: steady transfer from bed    Ambulation/Gait Ambulation/Gait assistance: Min guard, Supervision Gait Distance (Feet): 300 Feet Assistive device: None Gait Pattern/deviations: Step-through pattern Gait velocity: decreased     General Gait Details: mildly shaky but steady  Financial trader Rankin (Stroke Patients Only)       Balance Overall balance assessment: Needs assistance Sitting-balance support: No upper extremity supported, Feet supported Sitting balance-Leahy Scale: Normal Sitting balance - Comments: steady sitting reaching outside BOS   Standing balance support: No upper extremity supported, During functional activity Standing balance-Leahy Scale: Normal Standing balance comment: mildly shaky but no loss of balance ambulating                             Pertinent Vitals/Pain Pain Assessment Pain Assessment: Faces Faces Pain Scale: Hurts a little bit Pain Location: B LE's Pain Descriptors / Indicators: Sore Pain Intervention(s): Limited activity within patient's tolerance, Monitored during session, Repositioned Vitals (HR and O2 on  room air) stable and WFL throughout treatment session.    Home Living Family/patient expects to be discharged to:: Group home                   Additional  Comments: Care home    Prior Function Prior Level of Function : Independent/Modified Independent             Mobility Comments: Pt reports occasionally using cane; no recent falls.       Hand Dominance        Extremity/Trunk Assessment   Upper Extremity Assessment Upper Extremity Assessment: Overall WFL for tasks assessed    Lower Extremity Assessment Lower Extremity Assessment: Generalized weakness    Cervical / Trunk Assessment Cervical / Trunk Assessment: Normal  Communication   Communication: Other (comment) (Pt perseverating on needing breakfast with her medications (nurse reports breakfast should be arriving soon); pt tangential)  Cognition Arousal/Alertness: Awake/alert Behavior During Therapy: Anxious Overall Cognitive Status: No family/caregiver present to determine baseline cognitive functioning                                 General Comments: Oriented to person and that it is January or February (pt did not know where she was and reported it was 2022).        General Comments  Nursing cleared pt for participation in physical therapy.  Pt agreeable to PT session.    Exercises     Assessment/Plan    PT Assessment Patient needs continued PT services  PT Problem List Decreased strength;Decreased mobility;Decreased balance       PT Treatment Interventions DME instruction;Gait training;Functional mobility training;Therapeutic activities;Therapeutic exercise;Balance training;Patient/family education    PT Goals (Current goals can be found in the Care Plan section)  Acute Rehab PT Goals Patient Stated Goal: pt focused on needing to eat breakfast with her medications (did not state goal) PT Goal Formulation: With patient Time For Goal Achievement: 11/13/22 Potential to Achieve Goals: Fair    Frequency Min 2X/week     Co-evaluation               AM-PAC PT "6 Clicks" Mobility  Outcome Measure Help needed turning from your back  to your side while in a flat bed without using bedrails?: None Help needed moving from lying on your back to sitting on the side of a flat bed without using bedrails?: None Help needed moving to and from a bed to a chair (including a wheelchair)?: A Little Help needed standing up from a chair using your arms (e.g., wheelchair or bedside chair)?: A Little Help needed to walk in hospital room?: A Little Help needed climbing 3-5 steps with a railing? : A Little 6 Click Score: 20    End of Session Equipment Utilized During Treatment: Gait belt Activity Tolerance: Patient tolerated treatment well Patient left: in bed (in ED with security guard outside of room) Nurse Communication: Mobility status;Precautions PT Visit Diagnosis: Muscle weakness (generalized) (M62.81)    Time: 4081-4481 PT Time Calculation (min) (ACUTE ONLY): 13 min   Charges:   PT Evaluation $PT Eval Low Complexity: 1 Low         Jene Oravec, PT 10/30/22, 11:08 AM

## 2022-10-30 NOTE — ED Notes (Signed)
Lunch provided. Pt feeding themselves.

## 2022-10-30 NOTE — NC FL2 (Signed)
[place] LEVEL OF CARE FORM     IDENTIFICATION  Patient Name: Madison [name] Birthdate: [birth year] Sex: female Admission Date (Current Location): 10/24/2022  [place] and [place] Number:  Engineering geologist and Address:  [facility], [address], [place], [place] [address]      Provider Number: [id number]  Attending Physician Name and Address:  No att. providers found  Relative Name and Phone Number:  Signe Tackitt- [phone]    Current Level of Care: Other (Comment) (ED boarder) Recommended Level of Care: Assisted Living Facility Prior Approval Number:    Date Approved/Denied:   PASRR Number:    Discharge Plan: Other (Comment) (ALF)    Current Diagnoses: Patient Active Problem List   Diagnosis Date Noted   Murmur, cardiac 09/20/2022   Encounter for screening fecal occult blood testing 10/31/2021   Encounter for gynecological examination 32/95/1884   Acute metabolic encephalopathy 16/60/6301   Rhabdomyolysis 04/06/2020   Elevated troponin 04/06/2020   Pneumonia 04/06/2020   Lactic acidosis 04/06/2020   Suspected Sepsis ([place]) 04/06/2020   Possible NSTEMI (non-ST elevated myocardial infarction) ([place]) 04/06/2020    Suspected Overdose, undetermined intent, initial encounter 04/06/2020   Schizoaffective disorder, bipolar type ([place])    Bipolar affective disorder, current episode manic with psychotic symptoms ([place]) 12/25/2018   ESRD on hemodialysis ([place]) 12/25/2018   History of adenomatous polyp of colon 11/20/2018   Loss of weight 10/17/2017   Hypernatremia 04/17/2016   CKD (chronic kidney disease) stage 3, GFR 30-59 ml/min (HCC) 04/16/2016   Normocytic anemia 04/16/2016   Meningioma ([place]) 04/16/2016   Acute psychosis ([place]) 04/15/2016   Hyponatremia 04/15/2016   AKI (acute kidney injury) ([place]) 04/15/2016   Special screening for malignant neoplasms, colon    Thrombocytopenia ([place]) 01/05/2016   Altered mental status  04/19/2015    Orientation RESPIRATION BLADDER Height & Weight     Self, Place  Normal Continent Weight: 52.2 kg Height:     BEHAVIORAL SYMPTOMS/MOOD NEUROLOGICAL BOWEL NUTRITION STATUS        Diet (Regular)  AMBULATORY STATUS COMMUNICATION OF NEEDS Skin   Independent Verbally Normal                       Personal Care Assistance Level of Assistance  Bathing, Feeding, Dressing Bathing Assistance: Independent Feeding assistance: Independent Dressing Assistance: Independent     Functional Limitations Info  Sight, Hearing, Speech Sight Info: Adequate Hearing Info: Adequate Speech Info: Adequate    SPECIAL CARE FACTORS FREQUENCY                       Contractures Contractures Info: Not present    Additional Factors Info  Code Status, Allergies, Psychotropic Code Status Info: Full Allergies Info: NKA Psychotropic Info: Schizoaffective disorder bipolar type-  Depakote, zyprexa         Current Medications (10/30/2022):  This is the current hospital active medication list Current Facility-Administered Medications  Medication Dose Route Frequency Provider Last Rate Last Admin   acetaminophen (TYLENOL) tablet 650 mg  650 mg Oral TID Lucillie Garfinkel, MD   650 mg at 10/30/22 0811   alteplase (CATHFLO ACTIVASE) injection 2 mg  2 mg Intracatheter Once PRN Lateef, Munsoor, MD       Chlorhexidine Gluconate Cloth 2 % PADS 6 each  6 each Topical Q0600 Holley Raring, Munsoor, MD   6 each at 10/26/22 0542   diclofenac Sodium (VOLTAREN) 1 % topical gel 4  g  4 g Topical TID Duffy Bruce, MD   4 g at 10/29/22 2312   heparin injection 1,000 Units  1,000 Units Dialysis PRN Lateef, Munsoor, MD       lidocaine (PF) (XYLOCAINE) 1 % injection 5 mL  5 mL Intradermal PRN Lateef, Munsoor, MD       lidocaine-prilocaine (EMLA) cream 1 Application  1 Application Topical PRN Lateef, Munsoor, MD       midodrine (PROAMATINE) tablet 15 mg  15 mg Oral Q M,W,F-HD Lucillie Garfinkel, MD       OLANZapine  [facility]) tablet 5 mg  5 mg Oral BID Patrecia Pour, NP   5 mg at 10/30/22 6546   Or   OLANZapine (ZYPREXA) injection 5 mg  5 mg Intramuscular BID Patrecia Pour, NP   5 mg at 10/26/22 2127   oxyCODONE (Oxy IR/ROXICODONE) immediate release tablet 2.5 mg  2.5 mg Oral Q6H PRN Duffy Bruce, MD   2.5 mg at 10/29/22 1618   pantoprazole (PROTONIX) EC tablet 40 mg  40 mg Oral Daily Lucillie Garfinkel, MD   40 mg at 10/30/22 5035   pentafluoroprop-tetrafluoroeth (GEBAUERS) aerosol 1 Application  1 Application Topical PRN Lateef, Munsoor, MD       sodium zirconium cyclosilicate (LOKELMA) packet 10 g  10 g Oral Daily Lucillie Garfinkel, MD   10 g at 10/29/22 1019   valproic acid (DEPAKENE) 250 MG/5ML solution 500 mg  500 mg Oral TID WC Patrecia Pour, NP   500 mg at 10/30/22 4656   Current Outpatient Medications  Medication Sig Dispense Refill   ABILIFY MAINTENA 300 MG PRSY prefilled syringe Inject 300 mg into the muscle every 30 (thirty) days.     acetaminophen (TYLENOL) 325 MG tablet Take 650 mg by mouth 3 (three) times daily.     ARIPiprazole (ABILIFY) 5 MG tablet Take 5 mg by mouth daily.     benztropine (COGENTIN) 1 MG tablet Take 1 mg by mouth 2 (two) times daily.     Meloxicam 5 MG CAPS Take 5 mg by mouth daily.     midodrine (PROAMATINE) 10 MG tablet Take 15 mg by mouth every Monday, Wednesday, and Friday with hemodialysis.     pantoprazole (PROTONIX) 40 MG tablet TAKE 1 TABLET BY MOUTH ONCE DAILY. 90 tablet 3   risperiDONE (RISPERDAL) 1 MG/ML oral solution Take 1 mg by mouth 3 (three) times daily.     sodium zirconium cyclosilicate (LOKELMA) 10 g PACK packet Take 10 g by mouth.     valproic acid (DEPAKENE) 250 MG/5ML solution Take 250 mg by mouth 3 (three) times daily with meals.     ALPRAZolam (XANAX) 0.25 MG tablet Take 1 tablet (0.25 mg total) by mouth 3 (three) times daily as needed for anxiety. (Patient not taking: Reported on 10/25/2022) 30 tablet 0   aspirin EC 81 MG tablet Take 81 mg by mouth  daily. Swallow whole. (Patient not taking: Reported on 10/25/2022)     B Complex-C-Folic Acid (DIALYVITE TABLET) TABS Take 1 tablet by mouth daily. (Patient not taking: Reported on 10/25/2022)     docusate sodium (COLACE) 100 MG capsule Take 1 capsule (100 mg total) by mouth 2 (two) times daily. (Patient not taking: Reported on 10/25/2022) 10 capsule 0   DULoxetine (CYMBALTA) 20 MG capsule Take 1 capsule (20 mg total) by mouth daily. (Patient not taking: Reported on 10/25/2022)  3   epoetin alfa (EPOGEN,PROCRIT) 81275 UNIT/ML injection Inject 1 mL (10,000 Units total)  into the vein every Monday, Wednesday, and Friday with hemodialysis. 1 mL 1   ferric citrate (AURYXIA) 1 GM 210 MG(Fe) tablet Take 210 mg by mouth 3 (three) times daily with meals.  (Patient not taking: Reported on 10/25/2022)     gabapentin (NEURONTIN) 100 MG capsule Take 1 capsule (100 mg total) by mouth 2 (two) times daily. (Patient not taking: Reported on 10/25/2022) 60 capsule 1   loperamide (IMODIUM) 2 MG capsule Take by mouth as needed for diarrhea or loose stools. (Patient not taking: Reported on 10/25/2022)     LORazepam (ATIVAN) 0.5 MG tablet Take 0.5 mg by mouth every 8 (eight) hours. (Patient not taking: Reported on 10/25/2022)     senna (SENOKOT) 8.6 MG TABS tablet Take 1 tablet by mouth. (Patient not taking: Reported on 10/25/2022)     thiamine (VITAMIN B-1) 100 MG tablet Take 0.5 tablets (50 mg total) by mouth daily. (Patient not taking: Reported on 10/25/2022) 30 tablet 0     Discharge Medications: Please see discharge summary for a list of discharge medications.  Relevant Imaging Results:  Relevant Lab Results:   Additional Information ss# [id number]  Patient has Diayl[facility] MWF at [facility] [place]  Shelbie Hutching, RN

## 2022-10-30 NOTE — TOC Progression Note (Signed)
Transition of Care [facility]) - Progression Note    Patient Details  Name: Madison [name] MRN: [id number] Date of Birth: 03-04-1951  Transition of Care [facility]) CM/SW Contact  Shelbie Hutching, RN Phone Number: 10/30/2022, 1:58 PM  Clinical Narrative:    Judithann Graves has closed the appeal, it can not be completed as patient is not admitted to the hospital.  Left a message for sister [name].    Expected Discharge Plan: Assisted Living Barriers to Discharge: Family Issues  Expected Discharge Plan and Services   Discharge Planning Services: CM Consult   Living arrangements for the past 2 months: Assisted Living Facility                 DME Arranged: N/A DME Agency: NA       HH Arranged: NA HH Agency: NA         Social Determinants of Health (SDOH) Interventions SDOH Screenings   Alcohol Screen: Low Risk  (12/25/2018)  Tobacco Use: Low Risk  (09/20/2022)    Readmission Risk Interventions     No data to display

## 2022-10-30 NOTE — ED Provider Notes (Signed)
-----------------------------------------   4:24 AM on 10/30/2022 -----------------------------------------   Blood pressure (!) 127/54, pulse 96, temperature 98.3 F (36.8 C), temperature source Oral, resp. rate 18, weight 52.2 kg, SpO2 97 %.  The patient is calm and cooperative at this time.  There have been no acute events since the last update.  Awaiting disposition plan from case management/social work.    Cecilio Ohlrich, Delice Bison, DO 10/30/22 [phone]

## 2022-10-30 NOTE — ED Notes (Signed)
vol/pending placement.. 

## 2022-10-30 NOTE — Progress Notes (Signed)
[facility]  ROUNDING NOTE   Subjective:   Madison [name] is a 72 y.o. female with past medical history of schizophrenia and end-stage renal disease on hemodialysis.  Patient presents to the emergency department complaining of leg pain however caregiver state that due to patient's mental status, she has not been taking her medications or going to scheduled dialysis treatments.  Hemodialysis treatment yesterday. UF 1.8 liters.   Objective:  Vital signs in last 24 hours:  Temp:  [98.2 F (36.8 C)-98.3 F (36.8 C)] 98.3 F (36.8 C) (02/06 0717) Pulse Rate:  [77-117] 82 (02/06 0717) Resp:  [17-22] 18 (02/06 0717) BP: (109-160)/(54-93) 133/68 (02/06 0717) SpO2:  [97 %-100 %] 97 % (02/06 0717) Weight:  [52.2 kg] 52.2 kg (02/05 1520)  Weight change:  Filed Weights   10/26/22 1247 10/29/22 0948 10/29/22 1520  Weight: 52 kg 64.9 kg 52.2 kg    Intake/Output: I/O last 3 completed shifts: In: -  Out: 1800 [Other:1800]   Intake/Output this shift:  Total I/O In: 360 [P.O.:360] Out: -   Physical Exam: General: NAD, eating lunch when examined  Head: Normocephalic, atraumatic. Moist oral mucosal membranes  Eyes: Anicteric  Lungs:  Clear to auscultation   Heart: Regular rate and rhythm  Abdomen:  Soft, nontender, nondistended  Extremities:  No peripheral edema.  Neurologic: Alert to self and time. Not to place or situation  Skin: No lesions  Access: Lt AVF    Basic Metabolic Panel: Recent Labs  Lab 10/24/22 1139 10/25/22 1008 10/26/22 0919 10/27/22 0905 10/29/22 1602  NA 139 139 141 138 134*  K 5.2* 6.1* 5.6* 5.1 3.8  CL 96* 97* 100 97* 92*  CO2 '28 27 27 28 26  '$ GLUCOSE 111* 134* 109* 101* 92  BUN 64* 77* 54* 45* 31*  CREATININE 8.69* 10.35* 7.40* 6.12* 4.39*  CALCIUM 9.6 9.3 8.8* 9.6 9.4  PHOS  --  8.0* 6.2* 6.1*  --      Liver Function Tests: Recent Labs  Lab 10/24/22 1139 10/25/22 1008 10/26/22 0919 10/27/22 0905  AST 16  --   --   --   ALT  10  --   --   --   ALKPHOS 75  --   --   --   BILITOT 0.7  --   --   --   PROT 7.2  --   --   --   ALBUMIN 3.6 2.9* 2.8* 3.5    No results for input(s): "LIPASE", "AMYLASE" in the last 168 hours. No results for input(s): "AMMONIA" in the last 168 hours.  CBC: Recent Labs  Lab 10/24/22 1139 10/25/22 1008 10/26/22 0919 10/27/22 0905 10/29/22 1602  WBC 5.8 5.2 6.2 6.5 7.1  NEUTROABS  --   --   --   --  5.4  HGB 10.3* 8.8* 9.2* 10.9* 11.0*  HCT 32.4* 26.9* 28.1* 35.3* 34.2*  MCV 97.9 97.8 96.9 101.4* 98.6  PLT 194 166 167 212 241     Cardiac Enzymes: No results for input(s): "CKTOTAL", "CKMB", "CKMBINDEX", "TROPONINI" in the last 168 hours.  BNP: Invalid input(s): "POCBNP"  CBG: Recent Labs  Lab 10/25/22 [place]*     Microbiology: Results for orders placed or performed during the hospital encounter of 10/24/22  Resp panel by RT-PCR (RSV, Flu A&B, Covid) Anterior Nasal Swab     Status: None   Collection Time: 10/24/22  3:04 PM   Specimen: Anterior Nasal Swab  Result Value Ref Range  Status   SARS Coronavirus 2 by RT PCR NEGATIVE NEGATIVE Final    Comment: (NOTE) SARS-CoV-2 target nucleic acids are NOT DETECTED.  The SARS-CoV-2 RNA is generally detectable in upper respiratory specimens during the acute phase of infection. The lowest concentration of SARS-CoV-2 viral copies this assay can detect is 138 copies/mL. A negative result does not preclude SARS-Cov-2 infection and should not be used as the sole basis for treatment or other patient management decisions. A negative result may occur with  improper specimen collection/handling, submission of specimen other than nasopharyngeal swab, presence of viral mutation(s) within the areas targeted by this assay, and inadequate number of viral copies(<138 copies/mL). A negative result must be combined with clinical observations, patient history, and epidemiological information. The expected result is  Negative.  Fact Sheet for Patients:  EntrepreneurPulse.com.au  Fact Sheet for Healthcare Providers:  IncredibleEmployment.be  This test is no t yet approved or cleared by the [place] FDA and  has been authorized for detection and/or diagnosis of SARS-CoV-2 by FDA under an Emergency Use Authorization (EUA). This EUA will remain  in effect (meaning this test can be used) for the duration of the COVID-19 declaration under Section 564(b)(1) of the Act, 21 U.S.C.section 360bbb-3(b)(1), unless the authorization is terminated  or revoked sooner.       Influenza A by PCR NEGATIVE NEGATIVE Final   Influenza B by PCR NEGATIVE NEGATIVE Final    Comment: (NOTE) The Xpert Xpress SARS-CoV-2/FLU/RSV plus assay is intended as an aid in the diagnosis of influenza from Nasopharyngeal swab specimens and should not be used as a sole basis for treatment. Nasal washings and aspirates are unacceptable for Xpert Xpress SARS-CoV-2/FLU/RSV testing.  Fact Sheet for Patients: EntrepreneurPulse.com.au  Fact Sheet for Healthcare Providers: IncredibleEmployment.be  This test is not yet approved or cleared by the [place] FDA and has been authorized for detection and/or diagnosis of SARS-CoV-2 by FDA under an Emergency Use Authorization (EUA). This EUA will remain in effect (meaning this test can be used) for the duration of the COVID-19 declaration under Section 564(b)(1) of the Act, 21 U.S.C. section 360bbb-3(b)(1), unless the authorization is terminated or revoked.     Resp Syncytial Virus by PCR NEGATIVE NEGATIVE Final    Comment: (NOTE) Fact Sheet for Patients: EntrepreneurPulse.com.au  Fact Sheet for Healthcare Providers: IncredibleEmployment.be  This test is not yet approved or cleared by the [place] FDA and has been authorized for detection and/or diagnosis of  SARS-CoV-2 by FDA under an Emergency Use Authorization (EUA). This EUA will remain in effect (meaning this test can be used) for the duration of the COVID-19 declaration under Section 564(b)(1) of the Act, 21 U.S.C. section 360bbb-3(b)(1), unless the authorization is terminated or revoked.  Performed at [facility] [address] [place],  [address]     Coagulation Studies: No results for input(s): "LABPROT", "INR" in the last 72 hours.  Urinalysis: No results for input(s): "COLORURINE", "LABSPEC", "PHURINE", "GLUCOSEU", "HGBUR", "BILIRUBINUR", "KETONESUR", "PROTEINUR", "UROBILINOGEN", "NITRITE", "LEUKOCYTESUR" in the last 72 hours.  Invalid input(s): "APPERANCEUR"    Imaging: DG Knee Complete 4 Views Left  Result Date: 10/29/2022 CLINICAL DATA:  Left knee pain. EXAM: LEFT KNEE - COMPLETE 4+ VIEW COMPARISON:  None Available. FINDINGS: There is diffuse decreased bone mineralization. Moderate lateral compartment joint space narrowing, subchondral sclerosis and cystic change, and peripheral osteophytosis. Mild peripheral medial compartment degenerative osteophytosis without significant joint space narrowing. At least moderate patellofemoral joint space narrowing with mild peripheral degenerative osteophytes.  No joint effusion. No acute fracture or dislocation. IMPRESSION: Moderate lateral and at least mild-to-moderate patellofemoral compartment osteoarthritis. Electronically Signed   By: Yvonne Kendall M.D.   On: 10/29/2022 16:48     Medications:     acetaminophen  650 mg Oral TID   Chlorhexidine Gluconate Cloth  6 each Topical Q0600   diclofenac Sodium  4 g Topical TID   midodrine  15 mg Oral Q M,W,F-HD   OLANZapine  5 mg Oral BID   Or   OLANZapine  5 mg Intramuscular BID   pantoprazole  40 mg Oral Daily   sodium zirconium cyclosilicate  10 g Oral Daily   valproic acid  500 mg Oral TID WC   alteplase, heparin, lidocaine (PF), lidocaine-prilocaine, oxyCODONE,  pentafluoroprop-tetrafluoroeth  Assessment/ Plan:  Ms. AMONI SCALLAN is a 72 y.o. black female with schizophrenia and end-stage renal disease on hemodialysis.    Hyperkalemia with end-stage renal disease on hemodialysis: status post hemodialysis. K 3.8 yesterday. Continue MWF dialysis. Continue Lokelma  2. Anemia of chronic kidney disease Lab Results  Component Value Date   HGB 11.0 (L) 10/29/2022  Received Mircera outpatient.  Hemoglobin remains at goal.   3. Secondary Hyperparathyroidism: with hyperphosphatemia   Lab Results  Component Value Date   PTH 424 (H) 10/25/2022   CALCIUM 9.4 10/29/2022   PHOS 6.1 (H) 10/27/2022  Continue Auryxia, cinacalcet and calcitriol outpatient.      LOS: 0 [place] Knee 2/6/20241:15 PM

## 2022-10-30 NOTE — ED Provider Notes (Signed)
-----------------------------------------   1:59 PM on 10/30/2022 ----------------------------------------- Patient has been seen by social work they have arranged for the patient to go home with her sisters.  I will write for the patient's discharge including a prescription for Zyprexa.  No other concerning findings on medical workup.   Harvest Dark, MD 10/30/22 1404

## 2022-10-30 NOTE — ED Notes (Signed)
Pastor had supervised visit with patient.

## 2022-10-30 NOTE — Evaluation (Signed)
Occupational Therapy Evaluation Patient Details Name: Madison [name] MRN: [id number] DOB: Jan 27, 1951 Today's Date: 10/30/2022   History of Present Illness Pt is a 72 y.o. female presenting to hospital 10/24/22 with c/o AMS and confusion (pt not taking psychiatric medication past 3 months; refusing to go to dialysis last week); c/o pain to legs and back.   Clinical Impression   Madison [name] was seen for OT evaluation this date. Prior to hospital admission, pt was resident at [facility]. Upon arrival pt standing in room, perseverative on lunch and where her family is. Pt currently IND don/doff socks seated and functional mobility ~200 ft. Cues t/o for attention to task and redirection. Pt appears near functional baseline, will sign off. Upon hospital discharge, recommend no OT follow up.   Recommendations for follow up therapy are one component of a multi-disciplinary discharge planning process, led by the attending physician.  Recommendations may be updated based on patient status, additional functional criteria and insurance authorization.   Follow Up Recommendations  No OT follow up     Assistance Recommended at Discharge Intermittent Supervision/Assistance  Patient can return home with the following Help with stairs or ramp for entrance    Functional Status Assessment  Patient has had a recent decline in their functional status and demonstrates the ability to make significant improvements in function in a reasonable and predictable amount of time.  Equipment Recommendations  None recommended by OT    Recommendations for Other Services       Precautions / Restrictions Precautions Precautions: Fall Precaution Comments: L UE AV fistula Restrictions Weight Bearing Restrictions: No      Mobility Bed Mobility Overal bed mobility: Independent                  Transfers Overall transfer level: Independent                        Balance Overall balance assessment:  Independent                                         ADL either performed or assessed with clinical judgement   ADL Overall ADL's : Independent                                       General ADL Comments: IND don/doff socks seated and functional mobility ~200 ft      Pertinent Vitals/Pain Pain Assessment Pain Assessment: Faces Faces Pain Scale: Hurts a little bit Pain Location: B knees Pain Descriptors / Indicators: Sore Pain Intervention(s): Limited activity within patient's tolerance, Repositioned     Hand Dominance     Extremity/Trunk Assessment Upper Extremity Assessment Upper Extremity Assessment: Overall WFL for tasks assessed   Lower Extremity Assessment Lower Extremity Assessment: Overall WFL for tasks assessed   Cervical / Trunk Assessment Cervical / Trunk Assessment: Normal   Communication Communication Communication: Other (comment) (Pt perseverating on needing breakfast with her medications (nurse reports breakfast should be arriving soon); pt tangential)   Cognition Arousal/Alertness: Awake/alert Behavior During Therapy: Anxious Overall Cognitive Status: No family/caregiver present to determine baseline cognitive functioning  General Comments: perseverative on items being taken and being locked in her room      Home Living Family/patient expects to be discharged to:: Group home                                 Additional Comments: Care home      Prior Functioning/Environment Prior Level of Function : Independent/Modified Independent             Mobility Comments: Pt reports occasionally using cane; no recent falls.          OT Problem List: Decreased safety awareness         OT Goals(Current goals can be found in the care plan section) Acute Rehab OT Goals Patient Stated Goal: to go home OT Goal Formulation: With patient Time For Goal  Achievement: 11/13/22 Potential to Achieve Goals: Good   AM-PAC OT "6 Clicks" Daily Activity     Outcome Measure Help from another person eating meals?: None Help from another person taking care of personal grooming?: None Help from another person toileting, which includes using toliet, bedpan, or urinal?: None Help from another person bathing (including washing, rinsing, drying)?: A Little Help from another person to put on and taking off regular upper body clothing?: None Help from another person to put on and taking off regular lower body clothing?: None 6 Click Score: 23   End of Session    Activity Tolerance: Patient tolerated treatment well Patient left: in bed;with call bell/phone within reach;with nursing/sitter in room  OT Visit Diagnosis: Unsteadiness on feet (R26.81);Muscle weakness (generalized) (M62.81)                Time: 9597-4718 OT Time Calculation (min): 10 min Charges:  OT General Charges $OT Visit: 1 Visit OT Evaluation $OT Eval Low Complexity: 1 Low  Madison [name], M.S. OTR/L  10/30/22, 1:16 PM  ascom [phone]

## 2022-10-30 NOTE — TOC Progression Note (Signed)
Transition of Care [facility]) - Progression Note    Patient Details  Name: Madison [name] MRN: [id number] Date of Birth: 1951-08-14  [facility]) CM/SW Contact  Shelbie Hutching, RN Phone Number: 10/30/2022, 1:26 PM  Clinical Narrative:    Judithann Graves appeal status checked, currently under clinical review.   Expected Discharge Plan: Assisted Living Barriers to Discharge: Family Issues  Expected Discharge Plan and Services   Discharge Planning Services: CM Consult   Living arrangements for the past 2 months: Assisted Living Facility                 DME Arranged: N/A DME Agency: NA       HH Arranged: NA HH Agency: NA         Social Determinants of Health (SDOH) Interventions SDOH Screenings   Alcohol Screen: Low Risk  (12/25/2018)  Tobacco Use: Low Risk  (09/20/2022)    Readmission Risk Interventions     No data to display

## 2022-10-30 NOTE — TOC Progression Note (Signed)
Transition of Care [facility]) - Progression Note    Patient Details  Name: Madison [name] MRN: [id number] Date of Birth: [birth year]  [facility]) CM/SW Contact  Shelbie Hutching, RN Phone Number: 10/30/2022, 11:45 AM  Clinical Narrative:    Family has reached out to Medicare to appeal the patient being discharged from the emergency department.  RNCM tried to explain to patient's sister [name] that you cannot appeal when you have never been admitted.  Under review with [employer] once the see the documents sent in they will reach out to the family to explain why they appeal cannot be processed.   Discussed at length why the patient needed to be discharged from the emergency room back to [facility]:  finding placement can be a lengthy process, patient will also need a new dialysis center, which will take additional time.  Patient cannot stay in the hospital for the days to weeks that this could take.  Sisters did agree that they would not want to keep her here for that.  They can look for alternative placement from her current ALF.     Expected Discharge Plan: Assisted Living Barriers to Discharge: Family Issues  Expected Discharge Plan and Services   Discharge Planning Services: CM Consult   Living arrangements for the past 2 months: Assisted Living Facility                 DME Arranged: N/A DME Agency: NA       HH Arranged: NA HH Agency: NA         Social Determinants of Health (SDOH) Interventions SDOH Screenings   Alcohol Screen: Low Risk  (12/25/2018)  Tobacco Use: Low Risk  (09/20/2022)    Readmission Risk Interventions     No data to display

## 2022-10-30 NOTE — TOC Progression Note (Signed)
Transition of Care [facility]) - Progression Note    Patient Details  Name: Madison [name] MRN: [id number] Date of Birth: 1951/08/24  Transition of Care [facility]) CM/SW Contact  Shelbie Hutching, RN Phone Number: 10/30/2022, 9:43 AM  Clinical Narrative:    Reached out to Tammy at [facility], she reports that they do have a female bed available.  Referral secure emailed to [facility] for review.         Expected Discharge Plan and Services                                               Social Determinants of Health (SDOH) Interventions SDOH Screenings   Alcohol Screen: Low Risk  (12/25/2018)  Tobacco Use: Low Risk  (09/20/2022)    Readmission Risk Interventions     No data to display

## 2022-11-06 ENCOUNTER — Emergency Department (HOSPITAL_COMMUNITY): Payer: Medicare Other

## 2022-11-06 ENCOUNTER — Other Ambulatory Visit: Payer: Self-pay

## 2022-11-06 ENCOUNTER — Encounter (HOSPITAL_COMMUNITY): Payer: Self-pay

## 2022-11-06 ENCOUNTER — Emergency Department (HOSPITAL_COMMUNITY)
Admission: EM | Admit: 2022-11-06 | Discharge: 2022-11-06 | Disposition: A | Discharge status: Skilled Nursing Facility | Payer: Medicare Other | Attending: Emergency Medicine | Admitting: Emergency Medicine

## 2022-11-06 DIAGNOSIS — N186 End stage renal disease: Secondary | ICD-10-CM | POA: Insufficient documentation

## 2022-11-06 DIAGNOSIS — Z992 Dependence on renal dialysis: Secondary | ICD-10-CM | POA: Diagnosis not present

## 2022-11-06 DIAGNOSIS — M25511 Pain in right shoulder: Secondary | ICD-10-CM | POA: Insufficient documentation

## 2022-11-06 DIAGNOSIS — W19XXXA Unspecified fall, initial encounter: Secondary | ICD-10-CM

## 2022-11-06 DIAGNOSIS — Z7982 Long term (current) use of aspirin: Secondary | ICD-10-CM | POA: Insufficient documentation

## 2022-11-06 NOTE — ED Notes (Signed)
Notified [facility] of patient needing transportation back to [facility] in [place].

## 2022-11-06 NOTE — ED Triage Notes (Signed)
EMS reports pt sister witness pt slide out of bed and hit her right shoulder and was concerned she may have bumped her head as well.  Pt denies falling and has no complaints of pain or injury.

## 2022-11-06 NOTE — Discharge Instructions (Signed)
Xrays are normal No broken bones ER for worsening symptoms.

## 2022-11-06 NOTE — ED Provider Notes (Signed)
[facility] Provider Note   CSN: [id number] Arrival date & time: 11/06/22  1749     History  Chief Complaint  Patient presents with   Christyna Fenderson is a 72 y.o. female.  HPI   Is a 72 year old female, she presents to the hospital from her nursing facility where she had a witnessed fall by her sister, the report from paramedics was that the sister saw her slide out of bed and possibly injure her right shoulder, she had initially complained of shoulder pain but no longer complains of that.  The sister was worried she might about her head because of a small red spot at the hairline, the patient has no pain in that area.  Patient has a history of schizophrenia and is on dialysis for end-stage renal disease.  She is currently living at the [facility] Medications Prior to Admission medications   Medication Sig Start Date End Date Taking? Authorizing Provider  ABILIFY MAINTENA 300 MG PRSY prefilled syringe Inject 300 mg into the muscle every 30 (thirty) days. 10/22/22   [provider]  acetaminophen (TYLENOL) 325 MG tablet Take 650 mg by mouth 3 (three) times daily.    [provider]  ALPRAZolam Duanne Moron) 0.25 MG tablet Take 1 tablet (0.25 mg total) by mouth 3 (three) times daily as needed for anxiety. Patient not taking: Reported on 10/25/2022 04/18/20   Enzo Bi, MD  ARIPiprazole (ABILIFY) 5 MG tablet Take 5 mg by mouth daily. 10/22/22   [provider]  aspirin EC 81 MG tablet Take 81 mg by mouth daily. Swallow whole. Patient not taking: Reported on 10/25/2022    [provider]  B Complex-C-Folic Acid (DIALYVITE TABLET) TABS Take 1 tablet by mouth daily. Patient not taking: Reported on 10/25/2022 03/31/20   [provider]  benztropine (COGENTIN) 1 MG tablet Take 1 mg by mouth 2 (two) times daily. 07/24/22   [provider]  docusate sodium (COLACE) 100 MG capsule Take 1  capsule (100 mg total) by mouth 2 (two) times daily. Patient not taking: Reported on 10/25/2022 04/18/20   Enzo Bi, MD  DULoxetine (CYMBALTA) 20 MG capsule Take 1 capsule (20 mg total) by mouth daily. Patient not taking: Reported on 10/25/2022 04/18/20   Enzo Bi, MD  epoetin alfa (EPOGEN,PROCRIT) 60454 UNIT/ML injection Inject 1 mL (10,000 Units total) into the vein every Monday, Wednesday, and Friday with hemodialysis. 01/09/19   Clapacs, Madie Reno, MD  ferric citrate (AURYXIA) 1 GM 210 MG(Fe) tablet Take 210 mg by mouth 3 (three) times daily with meals.  Patient not taking: Reported on 10/25/2022    [provider]  gabapentin (NEURONTIN) 100 MG capsule Take 1 capsule (100 mg total) by mouth 2 (two) times daily. Patient not taking: Reported on 10/25/2022 01/08/19   Clapacs, Madie Reno, MD  loperamide (IMODIUM) 2 MG capsule Take by mouth as needed for diarrhea or loose stools. Patient not taking: Reported on 10/25/2022    [provider]  LORazepam (ATIVAN) 0.5 MG tablet Take 0.5 mg by mouth every 8 (eight) hours. Patient not taking: Reported on 10/25/2022    [provider]  Meloxicam 5 MG CAPS Take 5 mg by mouth daily. 09/04/22   [provider]  midodrine (PROAMATINE) 10 MG tablet Take 15 mg by mouth every Monday, Wednesday, and Friday with hemodialysis. 07/24/22   [provider]  OLANZapine (ZYPREXA) 5 MG tablet Take  1 tablet (5 mg total) by mouth 2 (two) times daily. 10/30/22 10/30/23  Harvest Dark, MD  pantoprazole (PROTONIX) 40 MG tablet TAKE 1 TABLET BY MOUTH ONCE DAILY. 05/26/19   Annitta Needs, NP  risperiDONE (RISPERDAL) 1 MG/ML oral solution Take 1 mg by mouth 3 (three) times daily.    [provider]  senna (SENOKOT) 8.6 MG TABS tablet Take 1 tablet by mouth. Patient not taking: Reported on 10/25/2022    [provider]  sodium zirconium cyclosilicate (LOKELMA) 10 g PACK packet Take 10 g by mouth.    [provider]  thiamine  (VITAMIN B-1) 100 MG tablet Take 0.5 tablets (50 mg total) by mouth daily. Patient not taking: Reported on 10/25/2022 01/08/19   Clapacs, Madie Reno, MD  valproic acid (DEPAKENE) 250 MG/5ML solution Take 250 mg by mouth 3 (three) times daily with meals. 08/14/22   [provider]      Allergies    Patient has no known allergies.    Review of Systems   Review of Systems  Unable to perform ROS: Psychiatric disorder    Physical Exam Updated Vital Signs BP (!) 165/76 (BP Location: Right Arm)   Pulse 85   Temp 98.9 F (37.2 C) (Oral)   Resp 16   Ht 1.626 m (5' 4"$ )   Wt 52.2 kg   SpO2 96%   BMI 19.75 kg/m  Physical Exam Vitals and nursing note reviewed.  Constitutional:      General: She is not in acute distress.    Appearance: She is well-developed.  HENT:     Head: Normocephalic and atraumatic.     Mouth/Throat:     Pharynx: No oropharyngeal exudate.  Eyes:     General: No scleral icterus.       Right eye: No discharge.        Left eye: No discharge.     Conjunctiva/sclera: Conjunctivae normal.     Pupils: Pupils are equal, round, and reactive to light.  Neck:     Thyroid: No thyromegaly.     Vascular: No JVD.  Cardiovascular:     Rate and Rhythm: Normal rate and regular rhythm.     Heart sounds: Normal heart sounds. No murmur heard.    No friction rub. No gallop.  Pulmonary:     Effort: Pulmonary effort is normal. No respiratory distress.     Breath sounds: Normal breath sounds. No wheezing or rales.  Abdominal:     General: Bowel sounds are normal. There is no distension.     Palpations: Abdomen is soft. There is no mass.     Tenderness: There is no abdominal tenderness.  Musculoskeletal:        General: No tenderness. Normal range of motion.     Cervical back: Normal range of motion and neck supple.     Right lower leg: No edema.     Left lower leg: No edema.  Lymphadenopathy:     Cervical: No cervical adenopathy.  Skin:    General: Skin is warm and dry.      Findings: No erythema or rash.  Neurological:     Mental Status: She is alert.     Coordination: Coordination normal.  Psychiatric:        Behavior: Behavior normal.     ED Results / Procedures / Treatments   Labs (all labs ordered are listed, but only abnormal results are displayed) Labs Reviewed - No data to display  EKG None  Radiology DG Shoulder Right  Result Date: 11/06/2022 CLINICAL DATA:  Right shoulder pain after fall. EXAM: RIGHT SHOULDER - 2+ VIEW COMPARISON:  None Available. FINDINGS: There is no evidence of fracture or dislocation. There is no evidence of arthropathy or other focal bone abnormality. Soft tissues are unremarkable. IMPRESSION: Negative. Electronically Signed   By: Marijo Conception M.D.   On: 11/06/2022 18:50    Procedures Procedures    Medications Ordered in ED Medications - No data to display  ED Course/ Medical Decision Making/ A&P                             Medical Decision Making Amount and/or Complexity of Data Reviewed Radiology: ordered.   The patient has no signs of trauma on my exam, after examining her head there is no tenderness or significant signs of redness or swelling or hematoma.  She has no tenderness over her neck and is completely neurologically intact with regards to her arms and legs.  Tried to discuss with family but no one available by phone, no family members at the bedside  X-ray negative for fracture of the arm, vital signs unremarkable, patient appears stable        Final Clinical Impression(s) / ED Diagnoses Final diagnoses:  Fall, initial encounter    Rx / DC Orders ED Discharge Orders     None         Noemi Chapel, MD 11/06/22 1859

## 2022-11-06 NOTE — ED Notes (Signed)
Patient transported to X-ray 

## 2022-12-18 ENCOUNTER — Ambulatory Visit: Payer: Medicare Other | Admitting: Podiatry

## 2022-12-24 NOTE — Progress Notes (Signed)
Cough, dyspnea, symptoms concerning for viral illness - including covid/flu/rsv

## 2022-12-26 ENCOUNTER — Emergency Department (HOSPITAL_COMMUNITY): Payer: Medicare Other

## 2022-12-26 ENCOUNTER — Inpatient Hospital Stay (HOSPITAL_COMMUNITY)
Admission: EM | Admit: 2022-12-26 | Discharge: 2022-12-31 | DRG: 391 | Disposition: A | Discharge status: Skilled Nursing Facility | Payer: Medicare Other | Source: Other Acute Inpatient Hospital | Attending: Internal Medicine | Admitting: Internal Medicine

## 2022-12-26 ENCOUNTER — Other Ambulatory Visit: Payer: Self-pay

## 2022-12-26 ENCOUNTER — Encounter (HOSPITAL_COMMUNITY): Payer: Self-pay | Admitting: *Deleted

## 2022-12-26 DIAGNOSIS — Z791 Long term (current) use of non-steroidal anti-inflammatories (NSAID): Secondary | ICD-10-CM | POA: Diagnosis not present

## 2022-12-26 DIAGNOSIS — E871 Hypo-osmolality and hyponatremia: Secondary | ICD-10-CM | POA: Diagnosis not present

## 2022-12-26 DIAGNOSIS — Z820 Family history of epilepsy and other diseases of the nervous system: Secondary | ICD-10-CM | POA: Diagnosis not present

## 2022-12-26 DIAGNOSIS — D631 Anemia in chronic kidney disease: Secondary | ICD-10-CM | POA: Diagnosis not present

## 2022-12-26 DIAGNOSIS — Z9049 Acquired absence of other specified parts of digestive tract: Secondary | ICD-10-CM

## 2022-12-26 DIAGNOSIS — Z992 Dependence on renal dialysis: Secondary | ICD-10-CM

## 2022-12-26 DIAGNOSIS — F312 Bipolar disorder, current episode manic severe with psychotic features: Secondary | ICD-10-CM

## 2022-12-26 DIAGNOSIS — R112 Nausea with vomiting, unspecified: Secondary | ICD-10-CM

## 2022-12-26 DIAGNOSIS — R4189 Other symptoms and signs involving cognitive functions and awareness: Secondary | ICD-10-CM | POA: Diagnosis present

## 2022-12-26 DIAGNOSIS — E861 Hypovolemia: Secondary | ICD-10-CM | POA: Diagnosis not present

## 2022-12-26 DIAGNOSIS — N2581 Secondary hyperparathyroidism of renal origin: Secondary | ICD-10-CM | POA: Diagnosis not present

## 2022-12-26 DIAGNOSIS — I441 Atrioventricular block, second degree: Secondary | ICD-10-CM | POA: Diagnosis present

## 2022-12-26 DIAGNOSIS — I501 Left ventricular failure: Secondary | ICD-10-CM | POA: Diagnosis not present

## 2022-12-26 DIAGNOSIS — Z841 Family history of disorders of kidney and ureter: Secondary | ICD-10-CM | POA: Diagnosis not present

## 2022-12-26 DIAGNOSIS — Z79899 Other long term (current) drug therapy: Secondary | ICD-10-CM | POA: Diagnosis not present

## 2022-12-26 DIAGNOSIS — A080 Rotaviral enteritis: Principal | ICD-10-CM | POA: Diagnosis present

## 2022-12-26 DIAGNOSIS — I9589 Other hypotension: Secondary | ICD-10-CM | POA: Diagnosis not present

## 2022-12-26 DIAGNOSIS — R6510 Systemic inflammatory response syndrome (SIRS) of non-infectious origin without acute organ dysfunction: Secondary | ICD-10-CM

## 2022-12-26 DIAGNOSIS — R001 Bradycardia, unspecified: Secondary | ICD-10-CM | POA: Diagnosis present

## 2022-12-26 DIAGNOSIS — Z833 Family history of diabetes mellitus: Secondary | ICD-10-CM

## 2022-12-26 DIAGNOSIS — N186 End stage renal disease: Secondary | ICD-10-CM

## 2022-12-26 DIAGNOSIS — I472 Ventricular tachycardia, unspecified: Secondary | ICD-10-CM | POA: Diagnosis not present

## 2022-12-26 DIAGNOSIS — N250 Renal osteodystrophy: Secondary | ICD-10-CM | POA: Diagnosis present

## 2022-12-26 DIAGNOSIS — Z8042 Family history of malignant neoplasm of prostate: Secondary | ICD-10-CM

## 2022-12-26 DIAGNOSIS — I120 Hypertensive chronic kidney disease with stage 5 chronic kidney disease or end stage renal disease: Secondary | ICD-10-CM | POA: Diagnosis not present

## 2022-12-26 DIAGNOSIS — A419 Sepsis, unspecified organism: Principal | ICD-10-CM | POA: Diagnosis present

## 2022-12-26 HISTORY — DX: Bipolar disorder, unspecified: F31.9

## 2022-12-26 LAB — CBC
HCT: 43.5 % (ref 36.0–46.0)
Hemoglobin: 14.1 g/dL (ref 12.0–15.0)
MCH: 32.4 pg (ref 26.0–34.0)
MCHC: 32.4 g/dL (ref 30.0–36.0)
MCV: 100 fL (ref 80.0–100.0)
Platelets: 225 10*3/uL (ref 150–400)
RBC: 4.35 MIL/uL (ref 3.87–5.11)
RDW: 19.5 % — ABNORMAL HIGH (ref 11.5–15.5)
WBC: 9.9 10*3/uL (ref 4.0–10.5)
nRBC: 0 % (ref 0.0–0.2)

## 2022-12-26 LAB — LACTIC ACID, PLASMA: Lactic Acid, Venous: 1.7 mmol/L (ref 0.5–1.9)

## 2022-12-26 LAB — COMPREHENSIVE METABOLIC PANEL
ALT: 14 U/L (ref 0–44)
AST: 19 U/L (ref 15–41)
Albumin: 3.9 g/dL (ref 3.5–5.0)
Alkaline Phosphatase: 81 U/L (ref 38–126)
Anion gap: 18 — ABNORMAL HIGH (ref 5–15)
BUN: 41 mg/dL — ABNORMAL HIGH (ref 8–23)
CO2: 28 mmol/L (ref 22–32)
Calcium: 10.1 mg/dL (ref 8.9–10.3)
Chloride: 92 mmol/L — ABNORMAL LOW (ref 98–111)
Creatinine, Ser: 6.03 mg/dL — ABNORMAL HIGH (ref 0.44–1.00)
GFR, Estimated: 7 mL/min — ABNORMAL LOW (ref >60)
Glucose, Bld: 102 mg/dL — ABNORMAL HIGH (ref 70–99)
Potassium: 3.7 mmol/L (ref 3.5–5.1)
Sodium: 138 mmol/L (ref 135–145)
Total Bilirubin: 0.7 mg/dL (ref 0.3–1.2)
Total Protein: 8.2 g/dL — ABNORMAL HIGH (ref 6.5–8.1)

## 2022-12-26 LAB — TROPONIN I (HIGH SENSITIVITY): Troponin I (High Sensitivity): 9 ng/L (ref <18)

## 2022-12-26 LAB — C DIFFICILE QUICK SCREEN W PCR REFLEX
C Diff antigen: NEGATIVE
C Diff interpretation: NOT DETECTED
C Diff toxin: NEGATIVE

## 2022-12-26 MED ORDER — ACETAMINOPHEN 650 MG RE SUPP: 650.0000 mg | Freq: Four times a day (QID) | RECTAL | Status: DC | PRN

## 2022-12-26 MED ORDER — SODIUM CHLORIDE 0.9 % IV SOLN
2.0000 g | Freq: Once | INTRAVENOUS | Status: AC
  Administered 2022-12-26: 2 g via INTRAVENOUS
  Filled 2022-12-26: qty 12.5

## 2022-12-26 MED ORDER — SODIUM CHLORIDE 0.9 % IV SOLN
1.0000 g | Freq: Once | INTRAVENOUS | Status: AC
  Administered 2022-12-26: 1 g via INTRAVENOUS
  Filled 2022-12-26: qty 10

## 2022-12-26 MED ORDER — POLYETHYLENE GLYCOL 3350 17 G PO PACK: 17.0000 g | PACK | Freq: Every day | ORAL | Status: DC | PRN

## 2022-12-26 MED ORDER — IOHEXOL 300 MG/ML  SOLN
75.0000 mL | Freq: Once | INTRAMUSCULAR | Status: AC | PRN
  Administered 2022-12-26: 75 mL via INTRAVENOUS

## 2022-12-26 MED ORDER — CHLORHEXIDINE GLUCONATE CLOTH 2 % EX PADS
6.0000 | MEDICATED_PAD | Freq: Every day | CUTANEOUS | Status: DC
  Administered 2022-12-26 – 2022-12-30 (×4): 6 via TOPICAL

## 2022-12-26 MED ORDER — SODIUM CHLORIDE 0.9 % IV BOLUS
250.0000 mL | Freq: Once | INTRAVENOUS | Status: AC
  Administered 2022-12-26: 250 mL via INTRAVENOUS

## 2022-12-26 MED ORDER — METRONIDAZOLE 500 MG/100ML IV SOLN
500.0000 mg | Freq: Two times a day (BID) | INTRAVENOUS | Status: DC
  Administered 2022-12-26 – 2022-12-28 (×4): 500 mg via INTRAVENOUS
  Filled 2022-12-26 (×4): qty 100

## 2022-12-26 MED ORDER — ONDANSETRON HCL 4 MG PO TABS: 4.0000 mg | ORAL_TABLET | Freq: Four times a day (QID) | ORAL | Status: DC | PRN

## 2022-12-26 MED ORDER — ONDANSETRON HCL 4 MG/2ML IJ SOLN
4.0000 mg | Freq: Once | INTRAMUSCULAR | Status: AC
  Administered 2022-12-26: 4 mg via INTRAVENOUS
  Filled 2022-12-26: qty 2

## 2022-12-26 MED ORDER — PANTOPRAZOLE SODIUM 40 MG PO TBEC
40.0000 mg | DELAYED_RELEASE_TABLET | Freq: Every day | ORAL | Status: DC
  Administered 2022-12-27 – 2022-12-31 (×5): 40 mg via ORAL
  Filled 2022-12-26 (×5): qty 1

## 2022-12-26 MED ORDER — HEPARIN SODIUM (PORCINE) 5000 UNIT/ML IJ SOLN
5000.0000 [IU] | Freq: Three times a day (TID) | INTRAMUSCULAR | Status: DC
  Administered 2022-12-27 – 2022-12-30 (×12): 5000 [IU] via SUBCUTANEOUS
  Filled 2022-12-26 (×12): qty 1

## 2022-12-26 MED ORDER — VANCOMYCIN HCL IN DEXTROSE 1-5 GM/200ML-% IV SOLN
1000.0000 mg | Freq: Once | INTRAVENOUS | Status: AC
  Administered 2022-12-26: 1000 mg via INTRAVENOUS
  Filled 2022-12-26: qty 200

## 2022-12-26 MED ORDER — ACETAMINOPHEN 325 MG PO TABS
650.0000 mg | ORAL_TABLET | Freq: Four times a day (QID) | ORAL | Status: DC | PRN
  Administered 2022-12-27 – 2022-12-29 (×3): 650 mg via ORAL
  Filled 2022-12-26 (×3): qty 2

## 2022-12-26 MED ORDER — VALPROIC ACID 250 MG/5ML PO SOLN
250.0000 mg | Freq: Three times a day (TID) | ORAL | Status: DC
  Administered 2022-12-27 – 2022-12-31 (×13): 250 mg via ORAL
  Filled 2022-12-26 (×21): qty 5

## 2022-12-26 MED ORDER — SODIUM CHLORIDE 0.9 % IV BOLUS
500.0000 mL | Freq: Once | INTRAVENOUS | Status: AC
  Administered 2022-12-26: 500 mL via INTRAVENOUS

## 2022-12-26 MED ORDER — ONDANSETRON HCL 4 MG/2ML IJ SOLN
4.0000 mg | Freq: Four times a day (QID) | INTRAMUSCULAR | Status: DC | PRN
  Administered 2022-12-28 – 2022-12-29 (×2): 4 mg via INTRAVENOUS
  Filled 2022-12-26 (×2): qty 2

## 2022-12-26 MED ORDER — MIDODRINE HCL 5 MG PO TABS
15.0000 mg | ORAL_TABLET | ORAL | Status: DC
  Administered 2022-12-26: 15 mg via ORAL
  Filled 2022-12-26: qty 3

## 2022-12-26 MED ORDER — ARIPIPRAZOLE 5 MG PO TABS
5.0000 mg | ORAL_TABLET | Freq: Every day | ORAL | Status: DC
  Administered 2022-12-27: 5 mg via ORAL
  Filled 2022-12-26: qty 1

## 2022-12-26 MED ORDER — VANCOMYCIN HCL 500 MG/100ML IV SOLN: 500.0000 mg | INTRAVENOUS | Status: DC

## 2022-12-26 NOTE — Discharge Instructions (Addendum)
It was our pleasure to provide your ER care today - we hope that you feel better.  Drink adequate fluids/stay well hydrated.   Follow up closely with primary care doctor/nephrologist in the coming week.  Return to ER right away if worse, new symptoms, fevers, chest pain, trouble breathing, persistent vomiting, or other emergency concern.

## 2022-12-26 NOTE — Assessment & Plan Note (Addendum)
Presenting with vomiting and hypotension.  Blood pressure systolic initially [id number], improved to low 100s with fluids at the time of my evaluation, now 80s again.  Also febrile to 100.6.  WBC 9.9.  Lactic acid 1.7.  Per nursing home- She still makes urine.   - Chest x-ray-unremarkable.   - CT abdomen and pelvis with contrast-  nonspecific mild intrahepatic biliary duct dilatation, status postcholecystectomy, normal common bile duct.   -ESRD-  need to rule out bacteremia -Now total of 1 L bolus given. -Continue broad-spectrum antibiotics IV Vanco cefepime and metronidazole -Follow-up blood culture results -If persistent hypotension, patient can receive another 250 bolus, and consider starting peripheral norepinephrine  -For now resume home midodrine 15 mg daily with HD - Stool C. difficile - negative - pending GI pathogen panel sent in ED - Check UA - Pro-calcitonin

## 2022-12-26 NOTE — Assessment & Plan Note (Addendum)
Resume Depakote, Abilify

## 2022-12-26 NOTE — ED Notes (Signed)
Pt given water and a snack

## 2022-12-26 NOTE — ED Notes (Signed)
Pt finished oral contrast. CT made aware.

## 2022-12-26 NOTE — ED Notes (Signed)
Patient is able tolerate fluids and food. Patient no longer feels nauseous.

## 2022-12-26 NOTE — ED Notes (Signed)
Pt ambulated to the BR without assistance. 

## 2022-12-26 NOTE — H&P (Signed)
History and Physical    Madison [name] [id number] DOB: [birth year] DOA: 12/26/2022  PCP: System, Provider Not In   Patient coming from: [place] have personally briefly reviewed patient's old medical records in [place]  Chief Complaint:   HPI: Madison [name] is a 72 y.o. female with medical history significant for  ESRD, anemia, schizoaffective disorder.  Patient was sent to the ED from dialysis via EMS with reports of nausea vomiting and low blood pressure.  She received 2 hours of her planned 3hr 45 minutes session.  Blood pressure was 70/40.  Heart rate 98. Monitor my evaluation patient is awake alert able to answer simple questions, she can tell me her name, denies abdominal pain chest pain cough or difficulty breathing, but she appears to have cognitive deficits or confusion and unable to tell me why she was brought here or where she is or her sister's name.   I talked to staff at the nursing home, she has cognitive deficits, mental status is at baseline, she has had good appetite over the past several days, no vomiting no loose stools no pain.  She still makes urine.   ED Course: Febrile to 100.6.  Heart rate 70s to 91.  Respiratory rate 17-24.  Blood pressure systolic down to [id number], was given 750 mill bolus with some improvement in blood pressure, blood pressure now fluctuating from high 80s to low 100s. Lactic acid 1.7. X-ray chest and abdomen negative for acute abnormality. CT abdomen and pelvis with contrast also negative for acute abnormality, shows distal colonic diverticulosis without diverticulitis, nonspecific mild intrahepatic biliary dilation, status post cystectomy.  Normal CBD Blood cultures obtained. IV Vanco and ceftriaxone started.  Review of Systems: As per HPI all other systems reviewed and negative.  Past Medical History:  Diagnosis Date   Anxiety    Bipolar disorder    ESRD (end stage renal disease) on dialysis    Neuropathy     Schizophrenia     Past Surgical History:  Procedure Laterality Date   CHOLECYSTECTOMY     COLONOSCOPY N/A 01/20/2016   Dr. Oneida Alar: 12 mm tubular adenoma removed from the transverse colon, 4 hyperplastic polyps removed from the rectum and sigmoid colon.  Next colonoscopy planned for April 2020.   ESOPHAGOGASTRODUODENOSCOPY N/A 12/19/2017   Procedure: ESOPHAGOGASTRODUODENOSCOPY (EGD);  Surgeon: [name], MD;  Location: [facility];  Service: Endoscopy;  Laterality: N/A;  8:30am   fistula left arm     GIVENS CAPSULE STUDY N/A 01/14/2018   Procedure: GIVENS CAPSULE STUDY;  Surgeon: [name], MD;  Location: [facility];  Service: Endoscopy;  Laterality: N/A;  7:30am   HEMICOLECTOMY Right    TUBAL LIGATION       reports that she has never smoked. She has never used smokeless tobacco. She reports that she does not drink alcohol and does not use drugs.  No Known Allergies  Family History  Problem Relation Age of Onset   Alzheimer's disease Mother    Cancer Mother        pancreatic   Diabetes Mother    Prostate cancer Father    Kidney failure Sister    Breast cancer Neg Hx    Colon cancer Neg Hx    Prior to Admission medications   Medication Sig Start Date End Date Taking? Authorizing Provider  acetaminophen (TYLENOL) 325 MG tablet Take 650 mg by mouth 3 (three) times daily.   Yes [provider]  ARIPiprazole (ABILIFY) 5 MG tablet Take 5 mg by mouth daily. 10/22/22  Yes [provider]  Meloxicam 5 MG CAPS Take 5 mg by mouth daily. 09/04/22  Yes [provider]  midodrine (PROAMATINE) 10 MG tablet Take 15 mg by mouth every Monday, Wednesday, and Friday with hemodialysis. 07/24/22  Yes [provider]  pantoprazole (PROTONIX) 40 MG tablet TAKE 1 TABLET BY MOUTH ONCE DAILY. 05/26/19  Yes Annitta Needs, NP  valproic acid (DEPAKENE) 250 MG/5ML solution Take 250 mg by mouth 3 (three) times daily with meals. 08/14/22  Yes [provider]  ABILIFY MAINTENA 300 MG PRSY prefilled syringe Inject 300 mg into the muscle every 30 (thirty) days. Patient not taking: Reported on 12/26/2022 10/22/22   [provider]  ALPRAZolam Duanne Moron) 0.25 MG tablet Take 1 tablet (0.25 mg total) by mouth 3 (three) times daily as needed for anxiety. Patient not taking: Reported on 10/25/2022 04/18/20   Enzo Bi, MD  aspirin EC 81 MG tablet Take 81 mg by mouth daily. Swallow whole. Patient not taking: Reported on 10/25/2022    [provider]  B Complex-C-Folic Acid (DIALYVITE TABLET) TABS Take 1 tablet by mouth daily. Patient not taking: Reported on 10/25/2022 03/31/20   [provider]  benztropine (COGENTIN) 1 MG tablet Take 1 mg by mouth 2 (two) times daily. Patient not taking: Reported on 12/26/2022 07/24/22   [provider]  docusate sodium (COLACE) 100 MG capsule Take 1 capsule (100 mg total) by mouth 2 (two) times daily. Patient not taking: Reported on 10/25/2022 04/18/20   Enzo Bi, MD  DULoxetine (CYMBALTA) 20 MG capsule Take 1 capsule (20 mg total) by mouth daily. Patient not taking: Reported on 10/25/2022 04/18/20   Enzo Bi, MD  epoetin alfa (EPOGEN,PROCRIT) 91478 UNIT/ML injection Inject 1 mL (10,000 Units total) into the vein every Monday, Wednesday, and Friday with hemodialysis. Patient not taking: Reported on 12/26/2022 01/09/19   Clapacs, Madie Reno, MD  ferric citrate (AURYXIA) 1 GM 210 MG(Fe) tablet Take 210 mg by mouth 3 (three) times daily with meals.  Patient not taking: Reported on 10/25/2022    [provider]  gabapentin (NEURONTIN) 100 MG capsule Take 1 capsule (100 mg total) by mouth 2 (two) times daily. Patient not taking: Reported on 10/25/2022 01/08/19   Clapacs, Madie Reno, MD  loperamide (IMODIUM) 2 MG capsule Take by mouth as needed for diarrhea or loose stools. Patient not taking: Reported on 10/25/2022    [provider]  LORazepam (ATIVAN) 0.5 MG tablet Take 0.5 mg by mouth every 8 (eight)  hours. Patient not taking: Reported on 10/25/2022    [provider]  OLANZapine (ZYPREXA) 5 MG tablet Take 1 tablet (5 mg total) by mouth 2 (two) times daily. Patient not taking: Reported on 12/26/2022 10/30/22 10/30/23  Harvest Dark, MD  risperiDONE (RISPERDAL) 1 MG/ML oral solution Take 1 mg by mouth 3 (three) times daily. Patient not taking: Reported on 12/26/2022    [provider]  senna (SENOKOT) 8.6 MG TABS tablet Take 1 tablet by mouth. Patient not taking: Reported on 10/25/2022    [provider]  sodium zirconium cyclosilicate (LOKELMA) 10 g PACK packet Take 10 g by mouth. Patient not taking: Reported on 12/26/2022    [provider]  thiamine (VITAMIN B-1) 100 MG tablet Take 0.5 tablets (50 mg total) by mouth daily. Patient not taking: Reported on 10/25/2022 01/08/19   Clapacs, Madie Reno, MD    Physical  Exam: Vitals:   12/26/22 2115 12/26/22 2130 12/26/22 2145 12/26/22 2150  BP: (!) 91/52 (!) 94/49 (!) 86/49   Pulse: 83 77 80   Resp: 14 16 17    Temp:    98.8 F (37.1 C)  TempSrc:    Oral  SpO2: 97% 98% 95%   Weight:        Constitutional: NAD, calm, comfortable Vitals:   12/26/22 2115 12/26/22 2130 12/26/22 2145 12/26/22 2150  BP: (!) 91/52 (!) 94/49 (!) 86/49   Pulse: 83 77 80   Resp: 14 16 17    Temp:    98.8 F (37.1 C)  TempSrc:    Oral  SpO2: 97% 98% 95%   Weight:       Eyes: PERRL, lids and conjunctivae normal ENMT: Mucous membranes are mildly dry Neck: normal, supple, no masses, no thyromegaly Respiratory: clear to auscultation bilaterally, no wheezing, no crackles. Normal respiratory effort. No accessory muscle use.  Cardiovascular: Regular rate and rhythm, no murmurs / rubs / gallops. No extremity edema.  Abdomen: no tenderness, no masses palpated. No hepatosplenomegaly. Bowel sounds positive.  Musculoskeletal: no clubbing / cyanosis. No joint deformity upper and lower extremities.  Skin: no rashes, lesions, ulcers. No  induration Neurologic: No apparent cranial nerve abnormality, moving extremities spontaneously. Psychiatric: Normal judgment and insight. Alert and oriented to person only.. Normal mood.   Labs on Admission: I have personally reviewed following labs and imaging studies  CBC: Recent Labs  Lab 12/26/22 1153  WBC 9.9  HGB 14.1  HCT 43.5  MCV 100.0  PLT 123456   Basic Metabolic Panel: Recent Labs  Lab 12/26/22 1153  NA 138  K 3.7  CL 92*  CO2 28  GLUCOSE 102*  BUN 41*  CREATININE 6.03*  CALCIUM 10.1   GFR: Estimated Creatinine Clearance: 6.9 mL/min (A) (by C-G formula based on SCr of 6.03 mg/dL (H)). Liver Function Tests: Recent Labs  Lab 12/26/22 1153  AST 19  ALT 14  ALKPHOS 81  BILITOT 0.7  PROT 8.2*  ALBUMIN 3.9    Radiological Exams on Admission: CT Abdomen Pelvis W Contrast  Result Date: 12/26/2022 CLINICAL DATA:  Abdominal pain, nausea and vomiting EXAM: CT ABDOMEN AND PELVIS WITH CONTRAST TECHNIQUE: Multidetector CT imaging of the abdomen and pelvis was performed using the standard protocol following bolus administration of intravenous contrast. RADIATION DOSE REDUCTION: This exam was performed according to the departmental dose-optimization program which includes automated exposure control, adjustment of the mA and/or kV according to patient size and/or use of iterative reconstruction technique. CONTRAST:  76mL OMNIPAQUE IOHEXOL 300 MG/ML  SOLN COMPARISON:  12/26/2022 FINDINGS: Lower chest: No acute pleural or parenchymal lung disease. Hepatobiliary: Cholecystectomy. There is mild intrahepatic biliary duct dilation, which is nonspecific. The common bile duct is normal in caliber measuring 6 mm. No evidence of choledocholithiasis. The liver is otherwise unremarkable. Pancreas: Unremarkable. No pancreatic ductal dilatation or surrounding inflammatory changes. Spleen: Normal in size without focal abnormality. Adrenals/Urinary Tract: Bilateral renal cortical atrophy, right  greater than left, consistent with end-stage renal disease. There are multiple bilateral renal hypodensities, with characteristics suggesting cysts. No specific imaging follow-up is recommended. No urinary tract calculi. The adrenals are normal. Bladder is decompressed, which limits its evaluation. Stomach/Bowel: No bowel obstruction or ileus. Administered oral contrast has progressed throughout the colon to the level of the rectum. Postsurgical changes from partial right hemicolectomy and reanastomosis. No evidence of bowel wall thickening or inflammatory change. Diverticulosis of the distal colon without evidence  of diverticulitis. Vascular/Lymphatic: Aortic atherosclerosis. No enlarged abdominal or pelvic lymph nodes. Reproductive: Uterus and bilateral adnexa are unremarkable. Other: No free fluid or free intraperitoneal gas. No abdominal wall hernia. Musculoskeletal: There are no acute or destructive bony lesions. Severe multilevel lumbar spondylosis. Reconstructed images demonstrate no additional findings. IMPRESSION: 1. No evidence of bowel obstruction or ileus. 2. Distal colonic diverticulosis without diverticulitis. 3. Nonspecific mild intrahepatic biliary duct dilation in this patient status post cholecystectomy. The common bile duct is normal in caliber. 4.  Aortic Atherosclerosis (ICD10-I70.0). Electronically Signed   By: Randa Ngo M.D.   On: 12/26/2022 19:31   DG ABD ACUTE 2+V W 1V CHEST  Result Date: 12/26/2022 CLINICAL DATA:  Vomiting EXAM: DG ABDOMEN ACUTE WITH 1 VIEW CHEST COMPARISON:  04/05/2020 FINDINGS: There is no evidence of dilated bowel loops or free intraperitoneal air. Cholecystectomy clips. No radiopaque calculi or other significant radiographic abnormality is seen. Heart size and mediastinal contours are within normal limits. Both lungs are clear. Lumbar levocurvature. IMPRESSION: 1. Nonobstructive bowel gas pattern. 2. No acute cardiopulmonary disease. Electronically Signed   By:  Davina Poke D.O.   On: 12/26/2022 12:40    EKG: Independently reviewed.  Sinus rhythm, rate 92, QTc 456.  No significant ST or T wave abnormalities  Assessment/Plan Principal Problem:   SIRS (systemic inflammatory response syndrome) Active Problems:   Bipolar affective disorder, current episode manic with psychotic symptoms   ESRD on hemodialysis    Assessment and Plan: * SIRS (systemic inflammatory response syndrome) Presenting with vomiting and hypotension.  Blood pressure systolic initially [id number], improved to low 100s with fluids at the time of my evaluation, now 80s again.  Also febrile to 100.6.  WBC 9.9.  Lactic acid 1.7.  Per nursing home- She still makes urine.   - Chest x-ray-unremarkable.   - CT abdomen and pelvis with contrast-  nonspecific mild intrahepatic biliary duct dilatation, status postcholecystectomy, normal common bile duct.   -ESRD-  need to rule out bacteremia -Now total of 1 L bolus given. -Continue broad-spectrum antibiotics IV Vanco cefepime and metronidazole -Follow-up blood culture results -If persistent hypotension, patient can receive another 250 bolus, and consider starting peripheral norepinephrine  -For now resume home midodrine 15 mg daily with HD - Stool C. difficile - negative - pending GI pathogen panel sent in ED - Check UA - Pro-calcitonin  ESRD on hemodialysis Completed 2 hours of initial plan 3 hours 45 minutes of dialysis. -Consult nephrology in a.m. for HD -She is on midodrine 15 mg with dialysis, will resume  Bipolar affective disorder, current episode manic with psychotic symptoms Resume Depakote, Abilify   DVT prophylaxis: Heparin Code Status:  Full code, no documentation from nursing home stating otherwise.  Staff believes she is full code. Family Communication: None at bedside. I talked to staff at nursing home. I also called sisterRise Paganini, on the phone, no response. Disposition Plan: ~/> 2 days Consults called: Please  consult nephrology in the morning Admission status: INpt stepdown I certify that at the point of admission it is my clinical judgment that the patient will require inpatient hospital care spanning beyond 2 midnights from the point of admission due to high intensity of service, high risk for further deterioration and high frequency of surveillance required.   Author: Bethena Roys, MD 12/26/2022 10:36 PM  For on call review [email or web address].

## 2022-12-26 NOTE — Progress Notes (Signed)
Pharmacy Antibiotic Note  Madison [name] is a 72 y.o. female admitted on 12/26/2022 with  vomiting and hypotension at HD today .  Pharmacy has been consulted for vancomycin dosing for r/o bacteremia; ceftriaxone x1 dose ordered. Normal HD on MWF. Tm 100.6, WBC are normal. Blood cultures have been collected.   Plan: Vancomycin 1000 mg IV load Vancomycin 500 mg IV after HD MWF Monitor HD schedule, clinical progress, cultures/sensitivities F/U LOT and de-escalate as able Vancomycin levels as clinically indicated   Weight: 52.2 kg (115 lb 1.3 oz)  Temp (24hrs), Avg:98.9 F (37.2 C), Min:98.3 F (36.8 C), Max:100.6 F (38.1 C)  Recent Labs  Lab 12/26/22 1153 12/26/22 1525  WBC 9.9  --   CREATININE 6.03*  --   LATICACIDVEN  --  1.7    Estimated Creatinine Clearance: 6.9 mL/min (A) (by C-G formula based on SCr of 6.03 mg/dL (H)).    No Known Allergies  Thank you for involving pharmacy in this patient's care.  Renold Genta, PharmD, BCPS Clinical Pharmacist 12/26/2022 8:27 PM

## 2022-12-26 NOTE — ED Triage Notes (Signed)
Pt brought in by [facility] from dialysis. Pt from [facility]. Pt was at dialysis and received 2 hours of her 3 hr and 45 min treatment. She started vomiting earlier this morning. BP 70/40, HR 98. EMS unable to obtain IV en route.

## 2022-12-26 NOTE — ED Notes (Signed)
Patient was given some thing to eat for dinner. Sister is at bedside.

## 2022-12-26 NOTE — ED Notes (Signed)
Second RN attempting IV access on pt, first two attempts unsuccessful

## 2022-12-26 NOTE — ED Notes (Signed)
Report given. Patient is going to ICU 2. Patient is ready for transport.

## 2022-12-26 NOTE — ED Provider Notes (Signed)
[facility] Provider Note   CSN: [id number] Arrival date & time: 12/26/22  1022     History  Chief Complaint  Patient presents with   Vomiting   Hypotension    Madison [name] is a 72 y.o. female.  Pt with hx esrd/hd mwf, presents from hd with nausea/vomiting, and low blood pressure. Pt had ~ 2/3 of her normal dialysis session when acute onset symptoms. No diarrhea. Pt limited historian - level 5 caveat. No report of fevers. No trauma/fall. No known bad food ingestion or ill contacts. Family indicates does not make urine at baseline. No known change in meds. Pt denies any pain. No headache. No chest pain. No abd pain.  The history is provided by the patient, medical records, the EMS personnel and a relative. The history is limited by the condition of the patient.       Home Medications Prior to Admission medications   Medication Sig Start Date End Date Taking? Authorizing Provider  acetaminophen (TYLENOL) 325 MG tablet Take 650 mg by mouth 3 (three) times daily.   Yes [provider]  ARIPiprazole (ABILIFY) 5 MG tablet Take 5 mg by mouth daily. 10/22/22  Yes [provider]  Meloxicam 5 MG CAPS Take 5 mg by mouth daily. 09/04/22  Yes [provider]  midodrine (PROAMATINE) 10 MG tablet Take 15 mg by mouth every Monday, Wednesday, and Friday with hemodialysis. 07/24/22  Yes [provider]  pantoprazole (PROTONIX) 40 MG tablet TAKE 1 TABLET BY MOUTH ONCE DAILY. 05/26/19  Yes Annitta Needs, NP  valproic acid (DEPAKENE) 250 MG/5ML solution Take 250 mg by mouth 3 (three) times daily with meals. 08/14/22  Yes [provider]  ABILIFY MAINTENA 300 MG PRSY prefilled syringe Inject 300 mg into the muscle every 30 (thirty) days. Patient not taking: Reported on 12/26/2022 10/22/22   [provider]  ALPRAZolam Duanne Moron) 0.25 MG tablet Take 1 tablet (0.25 mg total) by mouth 3 (three) times daily as  needed for anxiety. Patient not taking: Reported on 10/25/2022 04/18/20   Enzo Bi, MD  aspirin EC 81 MG tablet Take 81 mg by mouth daily. Swallow whole. Patient not taking: Reported on 10/25/2022    [provider]  B Complex-C-Folic Acid (DIALYVITE TABLET) TABS Take 1 tablet by mouth daily. Patient not taking: Reported on 10/25/2022 03/31/20   [provider]  benztropine (COGENTIN) 1 MG tablet Take 1 mg by mouth 2 (two) times daily. Patient not taking: Reported on 12/26/2022 07/24/22   [provider]  docusate sodium (COLACE) 100 MG capsule Take 1 capsule (100 mg total) by mouth 2 (two) times daily. Patient not taking: Reported on 10/25/2022 04/18/20   Enzo Bi, MD  DULoxetine (CYMBALTA) 20 MG capsule Take 1 capsule (20 mg total) by mouth daily. Patient not taking: Reported on 10/25/2022 04/18/20   Enzo Bi, MD  epoetin alfa (EPOGEN,PROCRIT) 09811 UNIT/ML injection Inject 1 mL (10,000 Units total) into the vein every Monday, Wednesday, and Friday with hemodialysis. Patient not taking: Reported on 12/26/2022 01/09/19   Clapacs, Madie Reno, MD  ferric citrate (AURYXIA) 1 GM 210 MG(Fe) tablet Take 210 mg by mouth 3 (three) times daily with meals.  Patient not taking: Reported on 10/25/2022    [provider]  gabapentin (NEURONTIN) 100 MG capsule Take 1 capsule (100 mg total) by mouth 2 (two) times daily. Patient not taking: Reported on 10/25/2022 01/08/19   Clapacs, Madie Reno, MD  loperamide (IMODIUM) 2 MG capsule Take by mouth as needed for diarrhea or loose stools. Patient not taking: Reported on 10/25/2022    [provider]  LORazepam (ATIVAN) 0.5 MG tablet Take 0.5 mg by mouth every 8 (eight) hours. Patient not taking: Reported on 10/25/2022    [provider]  OLANZapine (ZYPREXA) 5 MG tablet Take 1 tablet (5 mg total) by mouth 2 (two) times daily. Patient not taking: Reported on 12/26/2022 10/30/22 10/30/23  Harvest Dark, MD  risperiDONE (RISPERDAL) 1 MG/ML oral  solution Take 1 mg by mouth 3 (three) times daily. Patient not taking: Reported on 12/26/2022    [provider]  senna (SENOKOT) 8.6 MG TABS tablet Take 1 tablet by mouth. Patient not taking: Reported on 10/25/2022    [provider]  sodium zirconium cyclosilicate (LOKELMA) 10 g PACK packet Take 10 g by mouth. Patient not taking: Reported on 12/26/2022    [provider]  thiamine (VITAMIN B-1) 100 MG tablet Take 0.5 tablets (50 mg total) by mouth daily. Patient not taking: Reported on 10/25/2022 01/08/19   Clapacs, Madie Reno, MD      Allergies    Patient has no known allergies.    Review of Systems   Review of Systems  Constitutional:  Negative for fever.  HENT:  Negative for sore throat.   Respiratory:  Negative for cough and shortness of breath.   Cardiovascular:  Negative for chest pain and leg swelling.  Gastrointestinal:  Positive for nausea and vomiting. Negative for abdominal pain.  Genitourinary:  Negative for dysuria and flank pain.  Musculoskeletal:  Negative for back pain and neck pain.  Skin:  Negative for rash.  Neurological:  Negative for headaches.  Hematological:  Does not bruise/bleed easily.    Physical Exam Updated Vital Signs BP 91/75 (BP Location: Right Arm)   Pulse 85   Temp 98.3 F (36.8 C)   Resp 19   Wt 52.2 kg   SpO2 98%   BMI 19.75 kg/m  Physical Exam Vitals and nursing note reviewed.  Constitutional:      Appearance: Normal appearance. She is well-developed.  HENT:     Head: Atraumatic.     Nose: Nose normal.     Mouth/Throat:     Mouth: Mucous membranes are moist.  Eyes:     General: No scleral icterus.    Conjunctiva/sclera: Conjunctivae normal.     Pupils: Pupils are equal, round, and reactive to light.  Neck:     Trachea: No tracheal deviation.     Comments: No stiffness or rigidity Cardiovascular:     Rate and Rhythm: Normal rate and regular rhythm.     Pulses: Normal pulses.     Heart sounds: Normal heart  sounds. No murmur heard.    No friction rub. No gallop.  Pulmonary:     Effort: Pulmonary effort is normal. No respiratory distress.     Breath sounds: Normal breath sounds.  Abdominal:     General: Bowel sounds are normal. There is no distension.     Palpations: Abdomen is soft. There is no mass.     Tenderness: There is no abdominal tenderness. There is no guarding or rebound.     Hernia: No hernia is present.  Genitourinary:    Comments: No cva tenderness.  Musculoskeletal:        General: No swelling or tenderness.     Cervical back: Normal range of motion and neck supple. No rigidity. No muscular tenderness.  Skin:    General: Skin is warm and dry.     Findings: No rash.  Neurological:     Mental Status: She is alert.     Comments: Alert, speech normal. Motor/sens grossly intact bil.   Psychiatric:        Mood and Affect: Mood normal.     ED Results / Procedures / Treatments   Labs (all labs ordered are listed, but only abnormal results are displayed) Results for orders placed or performed during the hospital encounter of 12/26/22  CBC  Result Value Ref Range   WBC 9.9 4.0 - 10.5 K/uL   RBC 4.35 3.87 - 5.11 MIL/uL   Hemoglobin 14.1 12.0 - 15.0 g/dL   HCT 43.5 36.0 - 46.0 %   MCV 100.0 80.0 - 100.0 fL   MCH 32.4 26.0 - 34.0 pg   MCHC 32.4 30.0 - 36.0 g/dL   RDW 19.5 (H) 11.5 - 15.5 %   Platelets 225 150 - 400 K/uL   nRBC 0.0 0.0 - 0.2 %  Comprehensive metabolic panel  Result Value Ref Range   Sodium 138 135 - 145 mmol/L   Potassium 3.7 3.5 - 5.1 mmol/L   Chloride 92 (L) 98 - 111 mmol/L   CO2 28 22 - 32 mmol/L   Glucose, Bld 102 (H) 70 - 99 mg/dL   BUN 41 (H) 8 - 23 mg/dL   Creatinine, Ser 6.03 (H) 0.44 - 1.00 mg/dL   Calcium 10.1 8.9 - 10.3 mg/dL   Total Protein 8.2 (H) 6.5 - 8.1 g/dL   Albumin 3.9 3.5 - 5.0 g/dL   AST 19 15 - 41 U/L   ALT 14 0 - 44 U/L   Alkaline Phosphatase 81 38 - 126 U/L   Total Bilirubin 0.7 0.3 - 1.2 mg/dL   GFR, Estimated 7 (L) >60  mL/min   Anion gap 18 (H) 5 - 15  Lactic acid, plasma  Result Value Ref Range   Lactic Acid, Venous 1.7 0.5 - 1.9 mmol/L      EKG EKG Interpretation  Date/Time:  Wednesday December 26 2022 11:46:26 EDT Ventricular Rate:  92 PR Interval:  155 QRS Duration: 114 QT Interval:  368 QTC Calculation: 456 R Axis:   -37 Text Interpretation: Sinus rhythm Incomplete left bundle branch block Confirmed by Lajean Saver [phone]) on 12/26/2022 12:09:27 PM  Radiology DG ABD ACUTE 2+V W 1V CHEST  Result Date: 12/26/2022 CLINICAL DATA:  Vomiting EXAM: DG ABDOMEN ACUTE WITH 1 VIEW CHEST COMPARISON:  04/05/2020 FINDINGS: There is no evidence of dilated bowel loops or free intraperitoneal air. Cholecystectomy clips. No radiopaque calculi or other significant radiographic abnormality is seen. Heart size and mediastinal contours are within normal limits. Both lungs are clear. Lumbar levocurvature. IMPRESSION: 1. Nonobstructive bowel gas pattern. 2. No acute cardiopulmonary disease. Electronically Signed   By: Davina Poke D.O.   On: 12/26/2022 12:40    Procedures Procedures    Medications Ordered in ED Medications  sodium chloride 0.9 % bolus 500 mL (0 mLs Intravenous Stopped 12/26/22 1325)  ondansetron (ZOFRAN) injection 4 mg (4 mg Intravenous Given 12/26/22 1155)  sodium chloride 0.9 % bolus 250 mL (250 mLs Intravenous Bolus 12/26/22 1535)    ED Course/ Medical Decision Making/ A&P Clinical Course as of 12/26/22 1615  Wed Dec 26, 2022  1605 Received sign out from Dr. Ashok Cordia, presenting with low blood pressure and vomiting from dialysis. Received fluids. No fevers or leukocytosis. Pending lactate, blood cultures and CT  scan. Blood pressure improved but still low.  [WS]    Clinical Course User Index [WS] Cristie Hem, MD                             Medical Decision Making Problems Addressed: ESRD on dialysis: chronic illness or injury with exacerbation, progression, or side effects of  treatment that poses a threat to life or bodily functions Hypotension due to hypovolemia: acute illness or injury with systemic symptoms that poses a threat to life or bodily functions Nausea and vomiting in adult: acute illness or injury with systemic symptoms that poses a threat to life or bodily functions  Amount and/or Complexity of Data Reviewed Independent Historian: EMS    Details: hx External Data Reviewed: labs and notes. Labs: ordered. Decision-making details documented in ED Course. Radiology: ordered and independent interpretation performed. Decision-making details documented in ED Course. ECG/medicine tests: ordered and independent interpretation performed. Decision-making details documented in ED Course.  Risk Prescription drug management. Decision regarding hospitalization.   Iv ns. Continuous pulse ox and cardiac monitoring. Labs ordered/sent. Imaging ordered.   Differential diagnosis includes gastroenteritis, dehydration, sbo, etc . Dispo decision including potential need for admission considered - will get labs and imaging and reassess.   Reviewed nursing notes and prior charts for additional history. External reports reviewed. Additional history from: family, ems.   Cardiac monitor: sinus rhythm, rate 90.  Iv ns bolus. Zofran iv.   Labs reviewed/interpreted by me - wbc and hgb normal. K normal.   Xrays reviewed/interpreted by me - no sbo.  After fluids, bp remains soft. Pt is awake and alert, does respond to questions, family indicates pts mental status/function appears c/w baseline. Pt denies headache, chest pain or other specific pain.   As bp remains low, will add additional workup, cultures, lactate, small additional fluid bolus, ct imaging.   1610, labs/ct pending. Signed out to Dr Truett Mainland to check labs, recheck pt, and dispo appropriately.  CRITICAL CARE RE: acute hypotension, esrd/hd, nausea/vomiting w volume depletion/hypotension Performed by: Mirna Mires Total critical care time: 40 minutes Critical care time was exclusive of separately billable procedures and treating other patients. Critical care was necessary to treat or prevent imminent or life-threatening deterioration. Critical care was time spent personally by me on the following activities: development of treatment plan with patient and/or surrogate as well as nursing, discussions with consultants, evaluation of patient's response to treatment, examination of patient, obtaining history from patient or surrogate, ordering and performing treatments and interventions, ordering and review of laboratory studies, ordering and review of radiographic studies, pulse oximetry and re-evaluation of patient's condition.            Final Clinical Impression(s) / ED Diagnoses Final diagnoses:  None    Rx / DC Orders ED Discharge Orders     None         Lajean Saver, MD 12/26/22 [phone]

## 2022-12-26 NOTE — Assessment & Plan Note (Signed)
Systolic blood pressure fluctuating from 80s to low 100s.  Currently now 34s.

## 2022-12-26 NOTE — Assessment & Plan Note (Addendum)
Completed 2 hours of initial plan 3 hours 45 minutes of dialysis. -Consult nephrology in a.m. for HD -She is on midodrine 15 mg with dialysis, will resume

## 2022-12-27 ENCOUNTER — Other Ambulatory Visit (HOSPITAL_COMMUNITY): Payer: Self-pay | Admitting: *Deleted

## 2022-12-27 ENCOUNTER — Inpatient Hospital Stay (HOSPITAL_COMMUNITY): Payer: Medicare Other

## 2022-12-27 DIAGNOSIS — I472 Ventricular tachycardia, unspecified: Secondary | ICD-10-CM

## 2022-12-27 DIAGNOSIS — I501 Left ventricular failure: Secondary | ICD-10-CM

## 2022-12-27 DIAGNOSIS — I441 Atrioventricular block, second degree: Secondary | ICD-10-CM

## 2022-12-27 DIAGNOSIS — R6510 Systemic inflammatory response syndrome (SIRS) of non-infectious origin without acute organ dysfunction: Secondary | ICD-10-CM | POA: Diagnosis not present

## 2022-12-27 LAB — PROCALCITONIN: Procalcitonin: 1.66 ng/mL

## 2022-12-27 LAB — MRSA NEXT GEN BY PCR, NASAL: MRSA by PCR Next Gen: DETECTED — AB

## 2022-12-27 LAB — CBC
HCT: 34.9 % — ABNORMAL LOW (ref 36.0–46.0)
Hemoglobin: 11.3 g/dL — ABNORMAL LOW (ref 12.0–15.0)
MCH: 32.5 pg (ref 26.0–34.0)
MCHC: 32.4 g/dL (ref 30.0–36.0)
MCV: 100.3 fL — ABNORMAL HIGH (ref 80.0–100.0)
Platelets: 201 10*3/uL (ref 150–400)
RBC: 3.48 MIL/uL — ABNORMAL LOW (ref 3.87–5.11)
RDW: 19 % — ABNORMAL HIGH (ref 11.5–15.5)
WBC: 5.1 10*3/uL (ref 4.0–10.5)
nRBC: 0 % (ref 0.0–0.2)

## 2022-12-27 LAB — BASIC METABOLIC PANEL
Anion gap: 15 (ref 5–15)
BUN: 52 mg/dL — ABNORMAL HIGH (ref 8–23)
CO2: 22 mmol/L (ref 22–32)
Calcium: 9.1 mg/dL (ref 8.9–10.3)
Chloride: 94 mmol/L — ABNORMAL LOW (ref 98–111)
Creatinine, Ser: 7.25 mg/dL — ABNORMAL HIGH (ref 0.44–1.00)
GFR, Estimated: 6 mL/min — ABNORMAL LOW (ref >60)
Glucose, Bld: 88 mg/dL (ref 70–99)
Potassium: 3.8 mmol/L (ref 3.5–5.1)
Sodium: 131 mmol/L — ABNORMAL LOW (ref 135–145)

## 2022-12-27 LAB — ECHOCARDIOGRAM LIMITED
Height: 64 in
S' Lateral: 2.5 cm
Weight: 1809.54 oz

## 2022-12-27 LAB — MAGNESIUM: Magnesium: 1.8 mg/dL (ref 1.7–2.4)

## 2022-12-27 MED ORDER — NOREPINEPHRINE 4 MG/250ML-% IV SOLN
2.0000 ug/min | INTRAVENOUS | Status: DC
  Administered 2022-12-27: 2 ug/min via INTRAVENOUS
  Filled 2022-12-27: qty 250

## 2022-12-27 MED ORDER — SODIUM CHLORIDE 0.9 % IV SOLN
250.0000 mL | INTRAVENOUS | Status: DC
  Administered 2022-12-27: 250 mL via INTRAVENOUS

## 2022-12-27 MED ORDER — CHLORHEXIDINE GLUCONATE CLOTH 2 % EX PADS
6.0000 | MEDICATED_PAD | Freq: Every day | CUTANEOUS | Status: DC
  Administered 2022-12-27 – 2022-12-31 (×4): 6 via TOPICAL

## 2022-12-27 MED ORDER — MUPIROCIN 2 % EX OINT
1.0000 | TOPICAL_OINTMENT | Freq: Two times a day (BID) | CUTANEOUS | Status: DC
  Administered 2022-12-27 – 2022-12-31 (×8): 1 via NASAL
  Filled 2022-12-27 (×2): qty 22

## 2022-12-27 MED ORDER — MIDODRINE HCL 5 MG PO TABS
15.0000 mg | ORAL_TABLET | Freq: Two times a day (BID) | ORAL | Status: DC
  Administered 2022-12-27 – 2022-12-31 (×9): 15 mg via ORAL
  Filled 2022-12-27 (×9): qty 3

## 2022-12-27 MED ORDER — SODIUM CHLORIDE 0.9 % IV SOLN
1.0000 g | INTRAVENOUS | Status: DC
  Administered 2022-12-27: 1 g via INTRAVENOUS
  Filled 2022-12-27 (×2): qty 10

## 2022-12-27 NOTE — Consult Note (Addendum)
[facility] Nephrology Consult Note: Reason for Consult: To manage dialysis and dialysis related needs Referring Physician: Dr. Manuella Ghazi, Pratik  HPI:  Madison [name] is an 72 y.o. female with past medical history significant for hypertension, ESRD, anemia, secondary hyperparathyroidism, schizoaffective disorder, who presented with nausea vomiting and hypotension, seen as a consultation for the management of ESRD. Patient is currently confused therefore unable to obtain detailed history from her.  Most of the information obtained from the chart.  It seems like patient had about 2 hours of dialysis yesterday when she developed hypotension with change in her mental status.  Blood pressure was 70/80.  In the ER she was found to be hypotensive and high SIRS criteria.  She was given a liter of fluid and started empiric antibiotics and admitted for further evaluation. She received cefepime, vancomycin and Flagyl. The labs showed sodium of 131, potassium 3.8, hemoglobin 11.3, WBC 5.1 Chest x-ray and abdominal x-ray unremarkable. CT abdomen pelvis with no evidence of bowel obstruction or ileus.  From the chart and care everywhere, the patient receives dialysis at [facility] in [address], MWF, 225 minutes, 2K 2.5 calcium, left upper extremity AV fistula for the access, estimated dry weight was 50.5 kg.  The patient is lying comfortably, in room air, SBP 108/42.  She is confused.  Past Medical History:  Diagnosis Date   Anxiety    Bipolar disorder    ESRD (end stage renal disease) on dialysis    Neuropathy    Schizophrenia     Past Surgical History:  Procedure Laterality Date   CHOLECYSTECTOMY     COLONOSCOPY N/A 01/20/2016   Dr. Oneida Alar: 12 mm tubular adenoma removed from the transverse colon, 4 hyperplastic polyps removed from the rectum and sigmoid colon.  Next colonoscopy planned for April 2020.   ESOPHAGOGASTRODUODENOSCOPY N/A 12/19/2017   Procedure: ESOPHAGOGASTRODUODENOSCOPY (EGD);   Surgeon: [name], MD;  Location: [facility];  Service: Endoscopy;  Laterality: N/A;  8:30am   fistula left arm     GIVENS CAPSULE STUDY N/A 01/14/2018   Procedure: GIVENS CAPSULE STUDY;  Surgeon: [name], MD;  Location: [facility];  Service: Endoscopy;  Laterality: N/A;  7:30am   HEMICOLECTOMY Right    TUBAL LIGATION      Family History  Problem Relation Age of Onset   Alzheimer's disease Mother    Cancer Mother        pancreatic   Diabetes Mother    Prostate cancer Father    Kidney failure Sister    Breast cancer Neg Hx    Colon cancer Neg Hx     Social History:  reports that she has never smoked. She has never used smokeless tobacco. She reports that she does not drink alcohol and does not use drugs.  Allergies: No Known Allergies  Medications: I have reviewed the patient's current medications.   Results for orders placed or performed during the hospital encounter of 12/26/22 (from the past 48 hour(s))  CBC     Status: Abnormal   Collection Time: 12/26/22 11:53 AM  Result Value Ref Range   WBC 9.9 4.0 - 10.5 K/uL   RBC 4.35 3.87 - 5.11 MIL/uL   Hemoglobin 14.1 12.0 - 15.0 g/dL   HCT 43.5 36.0 - 46.0 %   MCV 100.0 80.0 - 100.0 fL   MCH 32.4 26.0 - 34.0 pg   MCHC 32.4 30.0 - 36.0 g/dL   RDW 19.5 (H) 11.5 - 15.5 %  Platelets 225 150 - 400 K/uL   nRBC 0.0 0.0 - 0.2 %    Comment: Performed at [facility], [address], [place], [place] [address]  Comprehensive metabolic panel     Status: Abnormal   Collection Time: 12/26/22 11:53 AM  Result Value Ref Range   Sodium 138 135 - 145 mmol/L   Potassium 3.7 3.5 - 5.1 mmol/L   Chloride 92 (L) 98 - 111 mmol/L   CO2 28 22 - 32 mmol/L   Glucose, Bld 102 (H) 70 - 99 mg/dL    Comment: Glucose reference range applies only to samples taken after fasting for at least 8 hours.   BUN 41 (H) 8 - 23 mg/dL   Creatinine, Ser 6.03 (H) 0.44 - 1.00 mg/dL   Calcium 10.1 8.9 - 10.3 mg/dL   Total Protein 8.2 (H) 6.5 -  8.1 g/dL   Albumin 3.9 3.5 - 5.0 g/dL   AST 19 15 - 41 U/L   ALT 14 0 - 44 U/L   Alkaline Phosphatase 81 38 - 126 U/L   Total Bilirubin 0.7 0.3 - 1.2 mg/dL   GFR, Estimated 7 (L) >60 mL/min    Comment: (NOTE) Calculated using the CKD-EPI Creatinine Equation (2021)    Anion gap 18 (H) 5 - 15    Comment: Performed at [facility], [address] [place], [place] [address]  Lactic acid, plasma     Status: None   Collection Time: 12/26/22  3:25 PM  Result Value Ref Range   Lactic Acid, Venous 1.7 0.5 - 1.9 mmol/L    Comment: Performed at [facility], [address] [place], [place] [address]  Blood culture (routine x 2)     Status: None (Preliminary result)   Collection Time: 12/26/22  3:25 PM   Specimen: BLOOD RIGHT ARM  Result Value Ref Range   Specimen Description BLOOD RIGHT ARM    Special Requests      BOTTLES DRAWN AEROBIC AND ANAEROBIC Blood Culture adequate volume   Culture      NO GROWTH < 24 HOURS Performed at [facility], [address], [place], [place] [address]    Report Status PENDING   Troponin I (High Sensitivity)     Status: None   Collection Time: 12/26/22  3:25 PM  Result Value Ref Range   Troponin I (High Sensitivity) 9 <18 ng/L    Comment: (NOTE) Elevated high sensitivity troponin I (hsTnI) values and significant  changes across serial measurements may suggest ACS but many other  chronic and acute conditions are known to elevate hsTnI results.  Refer to the "Links" section for chest pain algorithms and additional  guidance. Performed at [facility], [address] [place] [address] [place], [place] [address]   Blood culture (routine x 2)     Status: None (Preliminary result)   Collection Time: 12/26/22  3:50 PM   Specimen: BLOOD RIGHT ARM  Result Value Ref Range   Specimen Description BLOOD RIGHT ARM    Special Requests      BOTTLES DRAWN AEROBIC AND ANAEROBIC Blood Culture adequate volume   Culture      NO GROWTH < 24 HOURS Performed at [facility], [address] [place],  [address]    Report Status PENDING   C Difficile Quick Screen w PCR reflex     Status: None   Collection Time: 12/26/22  9:14 PM   Specimen: Stool  Result Value Ref Range   C Diff antigen NEGATIVE NEGATIVE  C Diff toxin NEGATIVE NEGATIVE   C Diff interpretation No C. difficile detected.     Comment: Performed at [facility], [address] [place], [place] [address]  MRSA Next Gen by PCR, Nasal     Status: Abnormal   Collection Time: 12/26/22 10:40 PM   Specimen: Nasal Mucosa; Nasal Swab  Result Value Ref Range   MRSA by PCR Next Gen DETECTED (A) NOT DETECTED    Comment: RESULT CALLED TO, READ BACK BY AND VERIFIED WITH: S.BHATTARAI AT 0305 ON 04.04.24 BY ADGER J         The GeneXpert MRSA Assay (FDA approved for NASAL specimens only), is one component of a comprehensive MRSA colonization surveillance program. It is not intended to diagnose MRSA infection nor to guide or monitor treatment for MRSA infections. Performed at [facility], [address], [place], [place] [address]   Procalcitonin     Status: None   Collection Time: 12/26/22 11:01 PM  Result Value Ref Range   Procalcitonin 1.66 ng/mL    Comment:        Interpretation: PCT > 0.5 ng/mL and <= 2 ng/mL: Systemic infection (sepsis) is possible, but other conditions are known to elevate PCT as well. (NOTE)       Sepsis PCT Algorithm           Lower Respiratory Tract                                      Infection PCT Algorithm    ----------------------------     ----------------------------         PCT < 0.25 ng/mL                PCT < 0.10 ng/mL          Strongly encourage             Strongly discourage   discontinuation of antibiotics    initiation of antibiotics    ----------------------------     -----------------------------       PCT 0.25 - 0.50 ng/mL            PCT 0.10 - 0.25 ng/mL               OR       >80% decrease in PCT            Discourage initiation of                                             antibiotics      Encourage discontinuation           of antibiotics    ----------------------------     -----------------------------         PCT >= 0.50 ng/mL              PCT 0.26 - 0.50 ng/mL                AND       <80% decrease in PCT             Encourage initiation of  antibiotics       Encourage continuation           of antibiotics    ----------------------------     -----------------------------        PCT >= 0.50 ng/mL                  PCT > 0.50 ng/mL               AND         increase in PCT                  Strongly encourage                                      initiation of antibiotics    Strongly encourage escalation           of antibiotics                                     -----------------------------                                           PCT <= 0.25 ng/mL                                                 OR                                        > 80% decrease in PCT                                      Discontinue / Do not initiate                                             antibiotics  Performed at [facility], [address], [place], [place] [id number]   Basic metabolic panel     Status: Abnormal   Collection Time: 12/27/22  5:12 AM  Result Value Ref Range   Sodium 131 (L) 135 - 145 mmol/L    Comment: DELTA CHECK NOTED   Potassium 3.8 3.5 - 5.1 mmol/L   Chloride 94 (L) 98 - 111 mmol/L   CO2 22 22 - 32 mmol/L   Glucose, Bld 88 70 - 99 mg/dL    Comment: Glucose reference range applies only to samples taken after fasting for at least 8 hours.   BUN 52 (H) 8 - 23 mg/dL   Creatinine, Ser 7.25 (H) 0.44 - 1.00 mg/dL   Calcium 9.1 8.9 - 10.3 mg/dL   GFR, Estimated 6 (L) >60 mL/min    Comment: (NOTE) Calculated using the CKD-EPI Creatinine Equation (2021)    Anion gap 15 5 - 15    Comment: Performed at [facility], [place]  [address], [place], [place] [address]  CBC     Status: Abnormal    Collection Time: 12/27/22  5:12 AM  Result Value Ref Range   WBC 5.1 4.0 - 10.5 K/uL   RBC 3.48 (L) 3.87 - 5.11 MIL/uL   Hemoglobin 11.3 (L) 12.0 - 15.0 g/dL   HCT 34.9 (L) 36.0 - 46.0 %   MCV 100.3 (H) 80.0 - 100.0 fL   MCH 32.5 26.0 - 34.0 pg   MCHC 32.4 30.0 - 36.0 g/dL   RDW 19.0 (H) 11.5 - 15.5 %   Platelets 201 150 - 400 K/uL   nRBC 0.0 0.0 - 0.2 %    Comment: Performed at [facility], [address] [place], [place] [address]    CT Abdomen Pelvis W Contrast  Result Date: 12/26/2022 CLINICAL DATA:  Abdominal pain, nausea and vomiting EXAM: CT ABDOMEN AND PELVIS WITH CONTRAST TECHNIQUE: Multidetector CT imaging of the abdomen and pelvis was performed using the standard protocol following bolus administration of intravenous contrast. RADIATION DOSE REDUCTION: This exam was performed according to the departmental dose-optimization program which includes automated exposure control, adjustment of the mA and/or kV according to patient size and/or use of iterative reconstruction technique. CONTRAST:  15mL OMNIPAQUE IOHEXOL 300 MG/ML  SOLN COMPARISON:  12/26/2022 FINDINGS: Lower chest: No acute pleural or parenchymal lung disease. Hepatobiliary: Cholecystectomy. There is mild intrahepatic biliary duct dilation, which is nonspecific. The common bile duct is normal in caliber measuring 6 mm. No evidence of choledocholithiasis. The liver is otherwise unremarkable. Pancreas: Unremarkable. No pancreatic ductal dilatation or surrounding inflammatory changes. Spleen: Normal in size without focal abnormality. Adrenals/Urinary Tract: Bilateral renal cortical atrophy, right greater than left, consistent with end-stage renal disease. There are multiple bilateral renal hypodensities, with characteristics suggesting cysts. No specific imaging follow-up is recommended. No urinary tract calculi. The adrenals are normal. Bladder is decompressed, which limits its evaluation. Stomach/Bowel: No bowel obstruction or  ileus. Administered oral contrast has progressed throughout the colon to the level of the rectum. Postsurgical changes from partial right hemicolectomy and reanastomosis. No evidence of bowel wall thickening or inflammatory change. Diverticulosis of the distal colon without evidence of diverticulitis. Vascular/Lymphatic: Aortic atherosclerosis. No enlarged abdominal or pelvic lymph nodes. Reproductive: Uterus and bilateral adnexa are unremarkable. Other: No free fluid or free intraperitoneal gas. No abdominal wall hernia. Musculoskeletal: There are no acute or destructive bony lesions. Severe multilevel lumbar spondylosis. Reconstructed images demonstrate no additional findings. IMPRESSION: 1. No evidence of bowel obstruction or ileus. 2. Distal colonic diverticulosis without diverticulitis. 3. Nonspecific mild intrahepatic biliary duct dilation in this patient status post cholecystectomy. The common bile duct is normal in caliber. 4.  Aortic Atherosclerosis (ICD10-I70.0). Electronically Signed   By: Randa Ngo M.D.   On: 12/26/2022 19:31   DG ABD ACUTE 2+V W 1V CHEST  Result Date: 12/26/2022 CLINICAL DATA:  Vomiting EXAM: DG ABDOMEN ACUTE WITH 1 VIEW CHEST COMPARISON:  04/05/2020 FINDINGS: There is no evidence of dilated bowel loops or free intraperitoneal air. Cholecystectomy clips. No radiopaque calculi or other significant radiographic abnormality is seen. Heart size and mediastinal contours are within normal limits. Both lungs are clear. Lumbar levocurvature. IMPRESSION: 1. Nonobstructive bowel gas pattern. 2. No acute cardiopulmonary disease. Electronically Signed   By: Davina Poke D.O.   On: 12/26/2022 12:40    ROS: Unable to obtain review of system because of her confusion. Blood pressure (!) 108/42, pulse (!) 57, temperature 98.4 F (36.9 C), temperature source Oral, resp. rate 18,  height 5\' 4"  (1.626 m), weight 51.3 kg, SpO2 98 %. Gen: NAD, comfortable Respiratory: Clear bilateral, no  wheezing or crackle Cardiovascular: Regular rate rhythm S1-S2 normal, no rubs GI: Abdomen soft, nontender, nondistended Extremities, no cyanosis or clubbing, no edema Skin: No rash or ulcer Neurology: Alert, awake but confused Dialysis Access: Aneurysmal dilatation of AV fistula in the left upper extremity.  Patient has good bruit.  Assessment/Plan:  # SIRS/hypotension: Apparently the patient was febrile on admission.  Workup including chest x-ray, CT abdomen is unremarkable.  Currently on empiric antibiotics per primary team.  Agree with midodrine twice a day.  Blood pressure is improving.  # ESRD: MWF at [facility].  Reportedly, she received around 2 hours of treatment yesterday when she developed hypotension.  She is currently euvolemic and no urgent need for dialysis today.  We will plan for regular HD tomorrow. We'll try to obtain OP records.  # Hypotension: It seems like this is her chronic baseline.  Continue midodrine twice a day and lower dialysate temperature to mitigate intradialytic hypotension. May need albumin prn.  # Anemia of ESRD: Hemoglobin at goal, no need for ESA.  # Metabolic Bone Disease: Monitor calcium and phosphorus,  # Hyponatremia: Managed with dialysis, UF as tolerated.  Thank you for the consult.  Lynita Groseclose Tanna Furry 12/27/2022, 10:44 AM

## 2022-12-27 NOTE — Progress Notes (Signed)
Patient HR jumped upto more than 200 bpm, V Tach, while the RN was in the room, giving her medicine, it didn't sustain for even a minute and lasted few seconds and came back to 60s, NSR ,  tried to print the rhythm, but the printer isn't working, endorsed to the Public house manager, will continue to monitor.

## 2022-12-27 NOTE — Progress Notes (Signed)
PROGRESS NOTE    Madison [name]  [id number] DOB: 30-Jun-1951 DOA: 12/26/2022 PCP: System, Provider Not In   Brief Narrative:    Madison [name] is a 72 y.o. female with medical history significant for  ESRD, anemia, schizoaffective disorder.  Patient was sent to the ED from dialysis via EMS with reports of nausea vomiting and low blood pressure.  She received 2 hours of her planned 3hr 45 minutes session.  Blood pressure was 70/40.  Heart rate 98.  She was admitted with SIRS criteria and no active signs of infection currently noted.  She was empirically started on IV antibiotics as noted below and given 1 L IV fluid bolus.  Assessment & Plan:   Principal Problem:   SIRS (systemic inflammatory response syndrome) Active Problems:   Bipolar affective disorder, current episode manic with psychotic symptoms   ESRD on hemodialysis  Assessment and Plan:  SIRS (systemic inflammatory response syndrome) Presenting with vomiting and hypotension.  Blood pressure systolic initially [id number], improved to low 100s with fluids at the time of my evaluation, now 80s again.  Also febrile to 100.6.  WBC 9.9.  Lactic acid 1.7.  Per nursing home- She still makes urine.   - Chest x-ray-unremarkable.   - CT abdomen and pelvis with contrast-  nonspecific mild intrahepatic biliary duct dilatation, status postcholecystectomy, normal common bile duct.   -ESRD-  need to rule out bacteremia -Now total of 1 L bolus given. -Continue broad-spectrum antibiotics IV Vanco cefepime and metronidazole -Follow-up blood culture results, pending -If persistent hypotension, patient can receive another 250 bolus, and consider starting peripheral norepinephrine  -For now resume home midodrine 15 mg daily with HD - Stool C. difficile - negative - pending GI pathogen panel sent in ED - Check UA-pending - Pro-calcitonin 1.66  Hypotension -Possibly related to septic shock, but no infectious source currently identified -Currently  on norepinephrine   ESRD on hemodialysis Completed 2 hours of initial plan 3 hours 45 minutes of dialysis. -Consult nephrology in a.m. for HD -She is on midodrine 15 mg with dialysis, will change to daily  NSVT Check magnesium level Obtain 2D echocardiogram   Bipolar affective disorder, current episode manic with psychotic symptoms Resume Depakote Discontinue oral Abilify as patient gets monthly shots    DVT prophylaxis:Heparin Code Status: Full Family Communication: Discussed with sister on phone 4/4 Disposition Plan:  Status is: Inpatient Remains inpatient appropriate because: Need for IV medications   Consultants:  Nephrology  Procedures:  None  Antimicrobials:  Anti-infectives (From admission, onward)    Start     Dose/Rate Route Frequency Ordered Stop   12/28/22 1200  vancomycin (VANCOREADY) IVPB 500 mg/100 mL        500 mg 100 mL/hr over 60 Minutes Intravenous Every M-W-F (Hemodialysis) 12/26/22 2039     12/27/22 2200  ceFEPIme (MAXIPIME) 1 g in sodium chloride 0.9 % 100 mL IVPB        1 g 200 mL/hr over 30 Minutes Intravenous Every 24 hours 12/27/22 0106     12/26/22 2330  ceFEPIme (MAXIPIME) 2 g in sodium chloride 0.9 % 100 mL IVPB        2 g 200 mL/hr over 30 Minutes Intravenous  Once 12/26/22 2234 12/26/22 2327   12/26/22 2215  metroNIDAZOLE (FLAGYL) IVPB 500 mg        500 mg 100 mL/hr over 60 Minutes Intravenous Every 12 hours 12/26/22 2208     12/26/22 2045  vancomycin (VANCOCIN) IVPB 1000 mg/200  mL premix        1,000 mg 200 mL/hr over 60 Minutes Intravenous  Once 12/26/22 2036 12/26/22 2256   12/26/22 2030  cefTRIAXone (ROCEPHIN) 1 g in sodium chloride 0.9 % 100 mL IVPB        1 g 200 mL/hr over 30 Minutes Intravenous  Once 12/26/22 2021 12/26/22 2139       Subjective: Patient seen and evaluated today with no new acute complaints or concerns. No acute concerns or events noted overnight.  She appears quite confused this a.m.  Objective: Vitals:    12/27/22 0630 12/27/22 0645 12/27/22 0700 12/27/22 0715  BP:    (!) 104/45  Pulse: 70 (!) 58 (!) 53 (!) 57  Resp: 20 18 17 16   Temp:      TempSrc:      SpO2: 95% 99% 99% 98%  Weight:      Height:       No intake or output data in the 24 hours ending 12/27/22 0733 Filed Weights   12/26/22 1039 12/26/22 2300  Weight: 52.2 kg 51.3 kg    Examination:  General exam: Appears calm and comfortable, confused Respiratory system: Clear to auscultation. Respiratory effort normal. Cardiovascular system: S1 & S2 heard, RRR.  Gastrointestinal system: Abdomen is soft Central nervous system: Alert and awake Extremities: No edema Skin: No significant lesions noted Psychiatry: Flat affect.    Data Reviewed: I have personally reviewed following labs and imaging studies  CBC: Recent Labs  Lab 12/26/22 1153 12/27/22 0512  WBC 9.9 5.1  HGB 14.1 11.3*  HCT 43.5 34.9*  MCV 100.0 100.3*  PLT 225 [id number]   Basic Metabolic Panel: Recent Labs  Lab 12/26/22 1153 12/27/22 0512  NA 138 131*  K 3.7 3.8  CL 92* 94*  CO2 28 22  GLUCOSE 102* 88  BUN 41* 52*  CREATININE 6.03* 7.25*  CALCIUM 10.1 9.1   GFR: Estimated Creatinine Clearance: 5.7 mL/min (A) (by C-G formula based on SCr of 7.25 mg/dL (H)). Liver Function Tests: Recent Labs  Lab 12/26/22 1153  AST 19  ALT 14  ALKPHOS 81  BILITOT 0.7  PROT 8.2*  ALBUMIN 3.9   No results for input(s): "LIPASE", "AMYLASE" in the last 168 hours. No results for input(s): "AMMONIA" in the last 168 hours. Coagulation Profile: No results for input(s): "INR", "PROTIME" in the last 168 hours. Cardiac Enzymes: No results for input(s): "CKTOTAL", "CKMB", "CKMBINDEX", "TROPONINI" in the last 168 hours. BNP (last 3 results) No results for input(s): "PROBNP" in the last 8760 hours. HbA1C: No results for input(s): "HGBA1C" in the last 72 hours. CBG: No results for input(s): "GLUCAP" in the last 168 hours. Lipid Profile: No results for input(s):  "CHOL", "HDL", "LDLCALC", "TRIG", "CHOLHDL", "LDLDIRECT" in the last 72 hours. Thyroid Function Tests: No results for input(s): "TSH", "T4TOTAL", "FREET4", "T3FREE", "THYROIDAB" in the last 72 hours. Anemia Panel: No results for input(s): "VITAMINB12", "FOLATE", "FERRITIN", "TIBC", "IRON", "RETICCTPCT" in the last 72 hours. Sepsis Labs: Recent Labs  Lab 12/26/22 1525 12/26/22 2301  PROCALCITON  --  1.66  LATICACIDVEN 1.7  --     Recent Results (from the past 240 hour(s))  Blood culture (routine x 2)     Status: None (Preliminary result)   Collection Time: 12/26/22  3:25 PM   Specimen: Blood  Result Value Ref Range Status   Specimen Description BLOOD RIGHT ARM  Final   Special Requests   Final    BOTTLES DRAWN AEROBIC AND ANAEROBIC  Blood Culture adequate volume Performed at [facility], [address], [place], [place] [address]    Culture PENDING  Incomplete   Report Status PENDING  Incomplete  Blood culture (routine x 2)     Status: None (Preliminary result)   Collection Time: 12/26/22  3:50 PM   Specimen: Blood  Result Value Ref Range Status   Specimen Description BLOOD RIGHT ARM  Final   Special Requests   Final    BOTTLES DRAWN AEROBIC AND ANAEROBIC Blood Culture adequate volume Performed at [facility], [address], [place], [place] [address]    Culture PENDING  Incomplete   Report Status PENDING  Incomplete  C Difficile Quick Screen w PCR reflex     Status: None   Collection Time: 12/26/22  9:14 PM   Specimen: Stool  Result Value Ref Range Status   C Diff antigen NEGATIVE NEGATIVE Final   C Diff toxin NEGATIVE NEGATIVE Final   C Diff interpretation No C. difficile detected.  Final    Comment: Performed at [facility], [address] [place], [place] [address]  MRSA Next Gen by PCR, Nasal     Status: Abnormal   Collection Time: 12/26/22 10:40 PM   Specimen: Nasal Mucosa; Nasal Swab  Result Value Ref Range Status   MRSA by PCR Next Gen DETECTED (A) NOT  DETECTED Final    Comment: RESULT CALLED TO, READ BACK BY AND VERIFIED WITH: S.[email or web address] AT 0305 ON 04.04.24 BY ADGER J         The GeneXpert MRSA Assay (FDA approved for NASAL specimens only), is one component of a comprehensive MRSA colonization surveillance program. It is not intended to diagnose MRSA infection nor to guide or monitor treatment for MRSA infections. Performed at [facility], [address] [place], [place] [address]          Radiology Studies: CT Abdomen Pelvis W Contrast  Result Date: 12/26/2022 CLINICAL DATA:  Abdominal pain, nausea and vomiting EXAM: CT ABDOMEN AND PELVIS WITH CONTRAST TECHNIQUE: Multidetector CT imaging of the abdomen and pelvis was performed using the standard protocol following bolus administration of intravenous contrast. RADIATION DOSE REDUCTION: This exam was performed according to the departmental dose-optimization program which includes automated exposure control, adjustment of the mA and/or kV according to patient size and/or use of iterative reconstruction technique. CONTRAST:  42mL OMNIPAQUE IOHEXOL 300 MG/ML  SOLN COMPARISON:  12/26/2022 FINDINGS: Lower chest: No acute pleural or parenchymal lung disease. Hepatobiliary: Cholecystectomy. There is mild intrahepatic biliary duct dilation, which is nonspecific. The common bile duct is normal in caliber measuring 6 mm. No evidence of choledocholithiasis. The liver is otherwise unremarkable. Pancreas: Unremarkable. No pancreatic ductal dilatation or surrounding inflammatory changes. Spleen: Normal in size without focal abnormality. Adrenals/Urinary Tract: Bilateral renal cortical atrophy, right greater than left, consistent with end-stage renal disease. There are multiple bilateral renal hypodensities, with characteristics suggesting cysts. No specific imaging follow-up is recommended. No urinary tract calculi. The adrenals are normal. Bladder is decompressed, which limits its evaluation.  Stomach/Bowel: No bowel obstruction or ileus. Administered oral contrast has progressed throughout the colon to the level of the rectum. Postsurgical changes from partial right hemicolectomy and reanastomosis. No evidence of bowel wall thickening or inflammatory change. Diverticulosis of the distal colon without evidence of diverticulitis. Vascular/Lymphatic: Aortic atherosclerosis. No enlarged abdominal or pelvic lymph nodes. Reproductive: Uterus and bilateral adnexa are unremarkable. Other: No free fluid or free intraperitoneal gas. No abdominal wall hernia. Musculoskeletal: There are no acute or  destructive bony lesions. Severe multilevel lumbar spondylosis. Reconstructed images demonstrate no additional findings. IMPRESSION: 1. No evidence of bowel obstruction or ileus. 2. Distal colonic diverticulosis without diverticulitis. 3. Nonspecific mild intrahepatic biliary duct dilation in this patient status post cholecystectomy. The common bile duct is normal in caliber. 4.  Aortic Atherosclerosis (ICD10-I70.0). Electronically Signed   By: Randa Ngo M.D.   On: 12/26/2022 19:31   DG ABD ACUTE 2+V W 1V CHEST  Result Date: 12/26/2022 CLINICAL DATA:  Vomiting EXAM: DG ABDOMEN ACUTE WITH 1 VIEW CHEST COMPARISON:  04/05/2020 FINDINGS: There is no evidence of dilated bowel loops or free intraperitoneal air. Cholecystectomy clips. No radiopaque calculi or other significant radiographic abnormality is seen. Heart size and mediastinal contours are within normal limits. Both lungs are clear. Lumbar levocurvature. IMPRESSION: 1. Nonobstructive bowel gas pattern. 2. No acute cardiopulmonary disease. Electronically Signed   By: Davina Poke D.O.   On: 12/26/2022 12:40        Scheduled Meds:  Madison [name]  5 mg Oral Daily   Chlorhexidine Gluconate Cloth  6 each Topical Q0600   heparin  5,000 Units Subcutaneous Q8H   midodrine  15 mg Oral Q M,W,F-HD   mupirocin ointment  1 Application Nasal BID   pantoprazole   40 mg Oral Daily   valproic acid  250 mg Oral TID WC   Continuous Infusions:  sodium chloride 250 mL (12/27/22 0116)   ceFEPime (MAXIPIME) IV     metronidazole 500 mg (12/26/22 2256)   norepinephrine (LEVOPHED) Adult infusion 2 mcg/min (12/27/22 0116)   [START ON 12/28/2022] vancomycin       LOS: 1 day    Time spent: 35 minutes    Joselito Fieldhouse Darleen Crocker, DO [facility]  If 7PM-7AM, please contact night-coverage [email or web address] 12/27/2022, 7:33 AM

## 2022-12-27 NOTE — TOC Progression Note (Signed)
Transition of Care [facility]) - Progression Note    Patient Details  Name: Madison [name] MRN: [id number] Date of Birth: [birth year]  Transition of Care [facility]) CM/SW Contact  Shade Flood, LCSW Phone Number: 12/27/2022, 4:31 PM  Clinical Narrative:     TOC received call from HD SW and also pt's sister regarding dc planning. Apparently pt is on a 30 day notice from [facility] as needing a higher level of care. Family asking for referral to [facility].   Referral started. Will follow.  Expected Discharge Plan: [place] Barriers to Discharge: Continued Medical Work up  Expected Discharge Plan and Services In-house Referral: Clinical Social Work     Living arrangements for the past 2 months: [place]                                       Social Determinants of Health (SDOH) Interventions SDOH Screenings   Alcohol Screen: Low Risk  (12/25/2018)  Tobacco Use: Low Risk  (12/26/2022)    Readmission Risk Interventions    12/27/2022    8:27 AM  Readmission Risk Prevention Plan  Transportation Screening Complete  HRI or Home Care Consult Complete  Social Work Consult for [place] Planning/Counseling Complete  Palliative Care Screening Not Applicable  Medication Review [employer] photographer) Complete

## 2022-12-27 NOTE — Consult Note (Signed)
CARDIOLOGY CONSULT NOTE    Patient ID: Madison [name]; [id number]; October 31, 1950   Admit date: 12/26/2022 Date of Consult: 12/27/2022  Primary Care Provider: System, Provider Not In Primary Cardiologist:  Primary Electrophysiologist:     Patient Profile:   Madison [name] is a 72 y.o. female with a hx HTN, ESRD DD, schizo affective disorder of who is being seen today for the evaluation of  at the request of .  History of Present Illness:   Madison [name] is a 72 year old F known to have HTN, ESRD DD, schizo affective disorder is currently admitted to the hospitalist team for the management of symptomatic hypotension requiring pressors. Cardiology is consulted for the telemetry evidence of V. tach and second-degree heart block. Family is at the bedside, 2 sisters and 1 niece.  Patient is currently at baseline, walks around only for activities. She can feed herself and take shower. But usually she is lethargic all the time per family. No other symptoms, currently on midodrine and norepinephrine for management of symptomatic hypotension.  Past Medical History:  Diagnosis Date   Anxiety    Bipolar disorder    ESRD (end stage renal disease) on dialysis    Neuropathy    Schizophrenia     Past Surgical History:  Procedure Laterality Date   CHOLECYSTECTOMY     COLONOSCOPY N/A 01/20/2016   Dr. Oneida Alar: 12 mm tubular adenoma removed from the transverse colon, 4 hyperplastic polyps removed from the rectum and sigmoid colon.  Next colonoscopy planned for April 2020.   ESOPHAGOGASTRODUODENOSCOPY N/A 12/19/2017   Procedure: ESOPHAGOGASTRODUODENOSCOPY (EGD);  Surgeon: [name], MD;  [facility];  Service: Endoscopy;  Laterality: N/A;  8:30am   fistula left arm     GIVENS CAPSULE STUDY N/A 01/14/2018   Procedure: GIVENS CAPSULE STUDY;  Surgeon: [name], MD;  Location: [facility];  Service: Endoscopy;  Laterality: N/A;  7:30am   HEMICOLECTOMY Right    TUBAL LIGATION          Inpatient Medications: Scheduled Meds:  Chlorhexidine Gluconate Cloth  6 each Topical Q0600   Chlorhexidine Gluconate Cloth  6 each Topical Q0600   heparin  5,000 Units Subcutaneous Q8H   midodrine  15 mg Oral BID   mupirocin ointment  1 Application Nasal BID   pantoprazole  40 mg Oral Daily   valproic acid  250 mg Oral TID WC   Continuous Infusions:  sodium chloride 5 mL/hr at 12/27/22 1454   ceFEPime (MAXIPIME) IV     metronidazole Stopped (12/27/22 1005)   norepinephrine (LEVOPHED) Adult infusion 1 mcg/min (12/27/22 1454)   [START ON 12/28/2022] vancomycin     PRN Meds: acetaminophen **OR** acetaminophen, ondansetron **OR** ondansetron (ZOFRAN) IV, polyethylene glycol  Allergies:   No Known Allergies  Social History:   Social History   Socioeconomic History   Marital status: Single    Spouse name: Not on file   Number of children: Not on file   Years of education: Not on file   Highest education level: Not on file  Occupational History   Not on file  Tobacco Use   Smoking status: Never   Smokeless tobacco: Never  Vaping Use   Vaping Use: Never used  Substance and Sexual Activity   Alcohol use: No   Drug use: No   Sexual activity: Not Currently    Birth control/protection: Surgical    Comment: hyst/tubal  Other Topics Concern   Not on file  Social History Narrative   Not on file   Social Determinants of Health   Financial Resource Strain: Not on file  Food Insecurity: Not on file  Transportation Needs: Not on file  Physical Activity: Not on file  Stress: Not on file  Social Connections: Not on file  Intimate Partner Violence: Not on file    Family History:    Family History  Problem Relation Age of Onset   Alzheimer's disease Mother    Cancer Mother        pancreatic   Diabetes Mother    Prostate cancer Father    Kidney failure Sister    Breast cancer Neg Hx    Colon cancer Neg Hx      ROS:  Please see the history of present illness.   ROS  All other ROS reviewed and negative.     Physical Exam/Data:   Vitals:   12/27/22 1500 12/27/22 1515 12/27/22 1530 12/27/22 1603  BP: (!) 108/49 (!) 91/42 (!) 93/35   Pulse: 65 64 60   Resp: 20 19 (!) 21   Temp:    98.8 F (37.1 C)  TempSrc:    Oral  SpO2: 97% 96% 96%   Weight:      Height:        Intake/Output Summary (Last 24 hours) at 12/27/2022 1611 Last data filed at 12/27/2022 1454 Gross per 24 hour  Intake 1110.75 ml  Output --  Net 1110.75 ml   Filed Weights   12/26/22 1039 12/26/22 2300  Weight: 52.2 kg 51.3 kg   Body mass index is 19.41 kg/[name].  General:  Well nourished, well developed, in no acute distress HEENT: normal Lymph: no adenopathy Neck: no JVD Endocrine:  No thryomegaly Vascular: No carotid bruits; FA pulses 2+ bilaterally without bruits  Cardiac:  normal S1, S2; RRR; no murmur  Lungs:  clear to auscultation bilaterally, no wheezing, rhonchi or rales  Abd: soft, nontender, no hepatomegaly  Ext: no edema Musculoskeletal:  No deformities, BUE and BLE strength normal and equal Skin: warm and dry  Neuro:  CNs 2-12 intact, no focal abnormalities noted Psych:  Normal affect   EKG:  The EKG was personally reviewed and demonstrates:   Telemetry:  Telemetry was personally reviewed and demonstrates:    Relevant CV Studies:   Laboratory Data:  Chemistry Recent Labs  Lab 12/26/22 1153 12/27/22 0512  NA 138 131*  K 3.7 3.8  CL 92* 94*  CO2 28 22  GLUCOSE 102* 88  BUN 41* 52*  CREATININE 6.03* 7.25*  CALCIUM 10.1 9.1  GFRNONAA 7* 6*  ANIONGAP 18* 15    Recent Labs  Lab 12/26/22 1153  PROT 8.2*  ALBUMIN 3.9  AST 19  ALT 14  ALKPHOS 81  BILITOT 0.7   Hematology Recent Labs  Lab 12/26/22 1153 12/27/22 0512  WBC 9.9 5.1  RBC 4.35 3.48*  HGB 14.1 11.3*  HCT 43.5 34.9*  MCV 100.0 100.3*  MCH 32.4 32.5  MCHC 32.4 32.4  RDW 19.5* 19.0*  PLT 225 201   Cardiac EnzymesNo results for input(s): "TROPONINI" in the last 168  hours. No results for input(s): "TROPIPOC" in the last 168 hours.  BNPNo results for input(s): "BNP", "PROBNP" in the last 168 hours.  DDimer No results for input(s): "DDIMER" in the last 168 hours.  Radiology/Studies:  ECHOCARDIOGRAM LIMITED  Result Date: 12/27/2022    ECHOCARDIOGRAM LIMITED REPORT   Patient Name:   Madison SADE [facility] Date of Exam: 12/27/2022  Medical Rec #:  [id number]       Height:       64.0 in Accession #:    [id number]      Weight:       113.1 lb Date of Birth:  04/19/1951        BSA:          1.535 [name] Patient Age:    21 years        BP:           109/42 mmHg Patient Gender: F               HR:           67 bpm. Exam Location:  [facility] Procedure: Limited Echo Indications:    Ventricular Tachycardia I47.2  History:        Patient has prior history of Echocardiogram examinations, most                 recent 10/04/2022. Previous Myocardial Infarction and CAD. ESRD                 on hemodialysis, Schizophrenia ([place]) (From Hx).  Sonographer:    Alvino Chapel RCS Referring Phys: [id number] [place] D Omar  1. Limited Echo for LVEF assessment.  2. Left ventricular ejection fraction, by estimation, is 60 to 65%. The left ventricle has normal function. The left ventricle has no regional wall motion abnormalities. Left ventricular diastolic function not assessed.  3. Right ventricular systolic function is normal. The right ventricular size is normal. Tricuspid regurgitation signal is inadequate for assessing PA pressure.  4. The inferior vena cava is normal in size with greater than 50% respiratory variability, suggesting right atrial pressure of 3 mmHg. Comparison(s): No significant change from prior study. FINDINGS  Left Ventricle: Left ventricular ejection fraction, by estimation, is 60 to 65%. The left ventricle has normal function. The left ventricle has no regional wall motion abnormalities. The left ventricular internal cavity size was normal in size. There is  no left ventricular  hypertrophy. Left ventricular diastolic function could not be evaluated. Right Ventricle: The right ventricular size is normal. Right vetricular wall thickness was not assessed. Right ventricular systolic function is normal. Tricuspid regurgitation signal is inadequate for assessing PA pressure. Left Atrium: Left atrial size was not assessed. Right Atrium: Right atrial size was not assessed. Pericardium: There is no evidence of pericardial effusion. Mitral Valve: The mitral valve is grossly normal. Tricuspid Valve: The tricuspid valve is not well visualized. Aortic Valve: The aortic valve was not well visualized. Pulmonic Valve: The pulmonic valve was not well visualized. Aorta: Aortic root could not be assessed. Venous: The inferior vena cava is normal in size with greater than 50% respiratory variability, suggesting right atrial pressure of 3 mmHg. IAS/Shunts: No atrial level shunt detected by color flow Doppler. LEFT VENTRICLE PLAX 2D LVIDd:         4.00 cm LVIDs:         2.50 cm LV PW:         1.10 cm LV IVS:        0.90 cm LVOT diam:     2.00 cm LVOT Area:     3.14 cm  RIGHT VENTRICLE TAPSE ([name]-mode): 2.4 cm LEFT ATRIUM         Index LA diam:    2.80 cm 1.82 cm/[name]   AORTA Ao Root diam: 2.90 cm  SHUNTS Systemic Diam: 2.00 cm [place] Markey Priya Derrick Tiegs Electronically  signed by Vangie Bicker Signature Date/Time: 12/27/2022/12:56:50 PM    Final    CT Abdomen Pelvis W Contrast  Result Date: 12/26/2022 CLINICAL DATA:  Abdominal pain, nausea and vomiting EXAM: CT ABDOMEN AND PELVIS WITH CONTRAST TECHNIQUE: Multidetector CT imaging of the abdomen and pelvis was performed using the standard protocol following bolus administration of intravenous contrast. RADIATION DOSE REDUCTION: This exam was performed according to the departmental dose-optimization program which includes automated exposure control, adjustment of the mA and/or kV according to patient size and/or use of iterative reconstruction technique.  CONTRAST:  70mL OMNIPAQUE IOHEXOL 300 MG/ML  SOLN COMPARISON:  12/26/2022 FINDINGS: Lower chest: No acute pleural or parenchymal lung disease. Hepatobiliary: Cholecystectomy. There is mild intrahepatic biliary duct dilation, which is nonspecific. The common bile duct is normal in caliber measuring 6 mm. No evidence of choledocholithiasis. The liver is otherwise unremarkable. Pancreas: Unremarkable. No pancreatic ductal dilatation or surrounding inflammatory changes. Spleen: Normal in size without focal abnormality. Adrenals/Urinary Tract: Bilateral renal cortical atrophy, right greater than left, consistent with end-stage renal disease. There are multiple bilateral renal hypodensities, with characteristics suggesting cysts. No specific imaging follow-up is recommended. No urinary tract calculi. The adrenals are normal. Bladder is decompressed, which limits its evaluation. Stomach/Bowel: No bowel obstruction or ileus. Administered oral contrast has progressed throughout the colon to the level of the rectum. Postsurgical changes from partial right hemicolectomy and reanastomosis. No evidence of bowel wall thickening or inflammatory change. Diverticulosis of the distal colon without evidence of diverticulitis. Vascular/Lymphatic: Aortic atherosclerosis. No enlarged abdominal or pelvic lymph nodes. Reproductive: Uterus and bilateral adnexa are unremarkable. Other: No free fluid or free intraperitoneal gas. No abdominal wall hernia. Musculoskeletal: There are no acute or destructive bony lesions. Severe multilevel lumbar spondylosis. Reconstructed images demonstrate no additional findings. IMPRESSION: 1. No evidence of bowel obstruction or ileus. 2. Distal colonic diverticulosis without diverticulitis. 3. Nonspecific mild intrahepatic biliary duct dilation in this patient status post cholecystectomy. The common bile duct is normal in caliber. 4.  Aortic Atherosclerosis (ICD10-I70.0). Electronically Signed   By: Randa Ngo [name].D.   On: 12/26/2022 19:31   DG ABD ACUTE 2+V W 1V CHEST  Result Date: 12/26/2022 CLINICAL DATA:  Vomiting EXAM: DG ABDOMEN ACUTE WITH 1 VIEW CHEST COMPARISON:  04/05/2020 FINDINGS: There is no evidence of dilated bowel loops or free intraperitoneal air. Cholecystectomy clips. No radiopaque calculi or other significant radiographic abnormality is seen. Heart size and mediastinal contours are within normal limits. Both lungs are clear. Lumbar levocurvature. IMPRESSION: 1. Nonobstructive bowel gas pattern. 2. No acute cardiopulmonary disease. Electronically Signed   By: Davina Poke D.O.   On: 12/26/2022 12:40     Telemetry strips            Assessment and Plan:   Patient is a 72 year old F known to have HTN, ESRD DD, schizo affective disorder is currently admitted to the hospitalist team for the management of symptomatic hypotension requiring pressors. Cardiology is consulted for the telemetry evidence of V. tach and second-degree heart block.  # Pseudo ventricular tachycardia -I reviewed all the strips on the telemetry and attached the strips above. Patient noted to have artifactual VT only in 1-lead and the other lead showed normal sinus rhythm as well as QRS clipping consistent with pseudo ventricular tachycardia. This artifact is likely secondary to tremors, body movements and agitation.  # Second-degree Mobitz type I AV block -I reviewed all the strips on the telemetry and attached the strips above.  Patient  is noted to have second-degree Mobitz type I AV block. No evidence of second-degree Mobitz type II AV block or high-grade AV block including complete heart block noted on the telemetry. Avoid AV nodal agents. Strongly recommend outpatient OSA evaluation.  # Symptomatic hypotension on pressors: Management per primary team  I have spent a total of 70 minutes with patient reviewing chart , telemetry, EKGs, labs and examining patient as well as establishing an assessment  and plan that was discussed with the patient.  > 50% of time was spent in direct patient care.    CRITICAL CARE Performed by: Quenten Raven Reino Lybbert   Total critical care time: 40 minutes  Critical care time was exclusive of separately billable procedures and treating other patients.  Critical care was necessary to treat or prevent imminent or life-threatening deterioration.  Critical care was time spent personally by me on the following activities: development of treatment plan with patient and/or surrogate as well as nursing, discussions with consultants, evaluation of patient's response to treatment, examination of patient, obtaining history from patient or surrogate, ordering and performing treatments and interventions, ordering and review of laboratory studies, ordering and review of radiographic studies, pulse oximetry and re-evaluation of patient's condition.    For questions or updates, please contact [place] Please consult [email or web address] for contact info under Cardiology/STEMI.   Signed, Vangie Bicker, MD 12/27/2022 4:11 PM

## 2022-12-27 NOTE — Progress Notes (Signed)
*  PRELIMINARY RESULTS* Echocardiogram Limited 2-D  has been performed.  Madison [name] 12/27/2022, 10:26 AM

## 2022-12-27 NOTE — TOC Initial Note (Signed)
Transition of Care [facility]) - Initial/Assessment Note    Patient Details  Name: Madison [name] MRN: [id number] Date of Birth: 1950/11/16  Transition of Care [facility]) CM/SW Contact:    Salome Arnt, LCSW Phone Number: 12/27/2022, 8:30 AM  Clinical Narrative: Pt from [facility]. Assessment completed with pt's sister, Madison [name] as pt oriented to self only per chart. Madison [name] reports pt has been a resident at [facility] for about 3 years. Madison [name] lives nearby and takes pt to her home for the weekend occasionally. She requests return to [facility] when medically stable. LCSW spoke with [place] at facility who reports pt is fairly independent with ADLs. Her dialysis is MWF at [facility] in [place] and [place] provides transportation. TOC will continue to follow.                   Expected Discharge Plan: Assisted Living Barriers to Discharge: Continued Medical Work up   Patient Goals and CMS Choice Patient states their goals for this hospitalization and ongoing recovery are:: return to ALF   Choice offered to / list presented to : Sibling [place] ownership interest in [facility].provided to::  (n/a)    Expected Discharge Plan and Services In-house Referral: Clinical Social Work     Living arrangements for the past 2 months: [place]                                      Prior Living Arrangements/Services Living arrangements for the past 2 months: [place] Lives with:: Facility Resident Patient language and need for interpreter reviewed:: Yes Do you feel safe going back to the place where you live?: Yes      Need for Family Participation in Patient Care: Yes (Comment) Care giver support system in place?: Yes (comment)   Criminal Activity/Legal Involvement Pertinent to Current Situation/Hospitalization: No - Comment as needed  Activities of Daily Living Home Assistive Devices/Equipment: Cane (specify quad or  straight)    Permission Sought/Granted                  Emotional Assessment   Attitude/Demeanor/Rapport: Unable to Assess Affect (typically observed): Unable to Assess Orientation: : Oriented to Self Alcohol / Substance Use: Not Applicable Psych Involvement: No (comment)  Admission diagnosis:  ESRD on dialysis [N18.6, Z99.2] Nausea and vomiting in adult [R11.2] Hypotension [I95.9] Hypotension due to hypovolemia [E86.1] Patient Active Problem List   Diagnosis Date Noted   SIRS (systemic inflammatory response syndrome) 12/26/2022   Murmur, cardiac 09/20/2022   Encounter for screening fecal occult blood testing 10/31/2021   Encounter for gynecological examination [id number]   Acute metabolic encephalopathy [id number]   Rhabdomyolysis 04/06/2020   Elevated troponin 04/06/2020   Pneumonia 04/06/2020   Lactic acidosis 04/06/2020   Suspected Sepsis ([place]) 04/06/2020   Possible NSTEMI (non-ST elevated myocardial infarction) ([place]) 04/06/2020    Suspected Overdose, undetermined intent, initial encounter 04/06/2020   Schizoaffective disorder, bipolar type    Bipolar affective disorder, current episode manic with psychotic symptoms 12/25/2018   ESRD on hemodialysis 12/25/2018   History of adenomatous polyp of colon 11/20/2018   Loss of weight 10/17/2017   Hypernatremia 04/17/2016   CKD (chronic kidney disease) stage 3, GFR 30-59 ml/min 04/16/2016   Normocytic anemia 04/16/2016   Meningioma 04/16/2016   Acute psychosis 04/15/2016   Hyponatremia 04/15/2016   AKI (acute kidney injury) 04/15/2016  Special screening for malignant neoplasms, colon    Thrombocytopenia 01/05/2016   Altered mental status 04/19/2015   PCP:  System, Provider Not In Pharmacy:   [place], [place] - [address] [place] [address] Phone: [phone] Fax: [place] [address] Phone: [phone] Fax: [place], [place] [id number] [place] [id number] Phone: [phone] Fax: [phone]     Social Determinants of Health (SDOH) Social History: SDOH Screenings   Alcohol Screen: Low Risk  (12/25/2018)  Tobacco Use: Low Risk  (12/26/2022)   SDOH Interventions:     Readmission Risk Interventions    12/27/2022    8:27 AM  Readmission Risk Prevention Plan  Transportation Screening Complete  HRI or [place] Complete  Social Work Consult for [place] Planning/Counseling Complete  Palliative Care Screening Not Applicable  Medication Review Press photographer) Complete

## 2022-12-27 NOTE — NC FL2 (Signed)
[place] LEVEL OF CARE FORM     IDENTIFICATION  Patient Name: Madison [name] Birthdate: November 03, 1950 Sex: female Admission Date (Current Location): 12/26/2022  [place] and [place] Number:  [place] LI:[id number] [place] and Address:  [facility] [address] [place]      Provider Number: [id number]  Attending Physician Name and Address:  Rodena Goldmann, DO  Relative Name and Phone Number:       Current Level of Care: Hospital Recommended Level of Care: Nursing Facility Prior Approval Number:    Date Approved/Denied:   PASRR Number:    Discharge Plan: SNF    Current Diagnoses: Patient Active Problem List   Diagnosis Date Noted   SIRS (systemic inflammatory response syndrome) 12/26/2022   Murmur, cardiac 09/20/2022   Encounter for screening fecal occult blood testing 10/31/2021   Encounter for gynecological examination [id number]   Acute metabolic encephalopathy [id number]   Rhabdomyolysis 04/06/2020   Elevated troponin 04/06/2020   Pneumonia 04/06/2020   Lactic acidosis 04/06/2020   Suspected Sepsis ([place]) 04/06/2020   Possible NSTEMI (non-ST elevated myocardial infarction) ([place]) 04/06/2020    Suspected Overdose, undetermined intent, initial encounter 04/06/2020   Schizoaffective disorder, bipolar type    Bipolar affective disorder, current episode manic with psychotic symptoms 12/25/2018   ESRD on hemodialysis 12/25/2018   History of adenomatous polyp of colon 11/20/2018   Loss of weight 10/17/2017   Hypernatremia 04/17/2016   CKD (chronic kidney disease) stage 3, GFR 30-59 ml/min 04/16/2016   Normocytic anemia 04/16/2016   Meningioma 04/16/2016   Acute psychosis 04/15/2016   Hyponatremia 04/15/2016   AKI (acute kidney injury) 04/15/2016   Special screening for malignant neoplasms, colon    Thrombocytopenia 01/05/2016   Altered mental status 04/19/2015    Orientation RESPIRATION BLADDER Height & Weight     Self   Normal Incontinent Weight: 113 lb 1.5 oz (51.3 kg) Height:  5\' 4"  (162.6 cm)  BEHAVIORAL SYMPTOMS/MOOD NEUROLOGICAL BOWEL NUTRITION STATUS      Incontinent Diet (Renal with 1200 mL fluid restriction)  AMBULATORY STATUS COMMUNICATION OF NEEDS Skin   Limited Assist Verbally Normal                       Personal Care Assistance Level of Assistance  Bathing, Feeding, Dressing Bathing Assistance: Limited assistance Feeding assistance: Limited assistance Dressing Assistance: Limited assistance     Functional Limitations Info  Sight, Hearing, Speech Sight Info: Adequate Hearing Info: Adequate Speech Info: Adequate    SPECIAL CARE FACTORS FREQUENCY  PT (By licensed PT), OT (By licensed OT)     PT Frequency: 5x week OT Frequency: 3x week            Contractures      Additional Factors Info  Code Status, Allergies, Psychotropic Code Status Info: Full code Allergies Info: No known allergies Psychotropic Info: Abilify, Xanax, Cymbalta, Ativan, Zyprexa, Risperdal         Current Medications (12/27/2022):  This is the current hospital active medication list Current Facility-Administered Medications  Medication Dose Route Frequency Provider Last Rate Last Admin   0.9 %  sodium chloride infusion  250 mL Intravenous Continuous Adefeso, Oladapo, DO 5 mL/hr at 12/27/22 1454 Infusion Verify at 12/27/22 1454   acetaminophen (TYLENOL) tablet 650 mg  650 mg Oral Q6H PRN Emokpae, Ejiroghene E, MD       Or   acetaminophen (TYLENOL) suppository 650 mg  650 mg Rectal Q6H PRN Emokpae,  Ejiroghene E, MD       ceFEPIme (MAXIPIME) 1 g in sodium chloride 0.9 % 100 mL IVPB  1 g Intravenous Q24H Erenest Blank, RPH       Chlorhexidine Gluconate Cloth 2 % PADS 6 each  6 each Topical Q0600 Emokpae, Ejiroghene E, MD   6 each at 12/27/22 0555   Chlorhexidine Gluconate Cloth 2 % PADS 6 each  6 each Topical Q0600 Rosita Fire, MD   6 each at 12/27/22 1208   heparin injection 5,000 Units   5,000 Units Subcutaneous Q8H Emokpae, Ejiroghene E, MD   5,000 Units at 12/27/22 1444   metroNIDAZOLE (FLAGYL) IVPB 500 mg  500 mg Intravenous Q12H Emokpae, Ejiroghene E, MD   Stopped at 12/27/22 1005   midodrine (PROAMATINE) tablet 15 mg  15 mg Oral BID Manuella Ghazi, Pratik D, DO   15 mg at 12/27/22 1101   mupirocin ointment (BACTROBAN) 2 % 1 Application  1 Application Nasal BID Adefeso, Oladapo, DO   1 Application at [id number]   norepinephrine (LEVOPHED) 4mg  in 262mL (0.016 mg/mL) premix infusion  2-10 mcg/min Intravenous Titrated Adefeso, Oladapo, DO 3.75 mL/hr at 12/27/22 1454 1 mcg/min at 12/27/22 1454   ondansetron (ZOFRAN) tablet 4 mg  4 mg Oral Q6H PRN Emokpae, Ejiroghene E, MD       Or   ondansetron (ZOFRAN) injection 4 mg  4 mg Intravenous Q6H PRN Emokpae, Ejiroghene E, MD       pantoprazole (PROTONIX) EC tablet 40 mg  40 mg Oral Daily Emokpae, Ejiroghene E, MD   40 mg at 12/27/22 0901   polyethylene glycol (MIRALAX / GLYCOLAX) packet 17 g  17 g Oral Daily PRN Emokpae, Ejiroghene E, MD       valproic acid (DEPAKENE) 250 MG/5ML solution 250 mg  250 mg Oral TID WC Emokpae, Ejiroghene E, MD   250 mg at 12/27/22 1104   [START ON 12/28/2022] vancomycin (VANCOREADY) IVPB 500 mg/100 mL  500 mg Intravenous Q M,W,F-HD Emokpae, Ejiroghene E, MD         Discharge Medications: Please see discharge summary for a list of discharge medications.  Relevant Imaging Results:  Relevant Lab Results:   Additional Information    Shade Flood, LCSW

## 2022-12-27 NOTE — Progress Notes (Signed)
Pharmacy Antibiotic Note  Madison [name] is a 72 y.o. female admitted on 12/26/2022 with sepsis.  Pharmacy has been consulted for Cefepime dosing. Already on Vancomycin. WBC WNL. ESRD on HD.   Plan: Already on vancomycin Cefepime 1g IV q24h  Height: 5\' 4"  (162.6 cm) Weight: 51.3 kg (113 lb 1.5 oz) IBW/kg (Calculated) : 54.7  Temp (24hrs), Avg:98.6 F (37 C), Min:97.8 F (36.6 C), Max:100.6 F (38.1 C)  Recent Labs  Lab 12/26/22 1153 12/26/22 1525  WBC 9.9  --   CREATININE 6.03*  --   LATICACIDVEN  --  1.7    Estimated Creatinine Clearance: 6.8 mL/min (A) (by C-G formula based on SCr of 6.03 mg/dL (H)).    No Known Allergies  Narda Bonds, PharmD, BCPS Clinical Pharmacist Phone: [phone]

## 2022-12-28 DIAGNOSIS — R6510 Systemic inflammatory response syndrome (SIRS) of non-infectious origin without acute organ dysfunction: Secondary | ICD-10-CM | POA: Diagnosis not present

## 2022-12-28 LAB — GASTROINTESTINAL PANEL BY PCR, STOOL (REPLACES STOOL CULTURE)

## 2022-12-28 MED ORDER — LIDOCAINE-PRILOCAINE 2.5-2.5 % EX CREA: 1.0000 | TOPICAL_CREAM | CUTANEOUS | Status: DC | PRN

## 2022-12-28 MED ORDER — PENTAFLUOROPROP-TETRAFLUOROETH EX AERO: 1.0000 | INHALATION_SPRAY | CUTANEOUS | Status: DC | PRN

## 2022-12-28 MED ORDER — LIDOCAINE HCL (PF) 1 % IJ SOLN: 5.0000 mL | INTRAMUSCULAR | Status: DC | PRN

## 2022-12-28 MED ORDER — CINACALCET HCL 30 MG PO TABS
150.0000 mg | ORAL_TABLET | ORAL | Status: DC
  Administered 2022-12-28: 150 mg via ORAL
  Filled 2022-12-28: qty 5

## 2022-12-28 MED ORDER — DOXERCALCIFEROL 4 MCG/2ML IV SOLN
7.0000 ug | INTRAVENOUS | Status: DC
  Administered 2022-12-28 – 2022-12-31 (×2): 7 ug via INTRAVENOUS
  Filled 2022-12-28: qty 4

## 2022-12-28 NOTE — TOC Progression Note (Signed)
Transition of Care [facility]) - Progression Note    Patient Details  Name: Madison [name] MRN: [id number] Date of Birth: [birth year]  [facility] CM/SW Contact  Villa Herb, [place] Phone Number: 12/28/2022, 10:56 AM  Clinical Narrative:    CSW confirmed pts PASRR has been completed. CSW updated Revonda Standard with [facility], they will be able to take pt over the weekend if she is ready. TOC to follow.   Expected Discharge Plan: Skilled Nursing Facility Barriers to Discharge: Continued Medical Work up  Expected Discharge Plan and Services In-house Referral: Clinical Social Work     Living arrangements for the past 2 months: Assisted Living Facility                                       Social Determinants of Health (SDOH) Interventions SDOH Screenings   Alcohol Screen: Low Risk  (12/25/2018)  Tobacco Use: Low Risk  (12/26/2022)    Readmission Risk Interventions    12/27/2022    8:27 AM  Readmission Risk Prevention Plan  Transportation Screening Complete  HRI or Home Care Consult Complete  Social Work Consult for Recovery Care Planning/Counseling Complete  Palliative Care Screening Not Applicable  Medication Review Oceanographer) Complete

## 2022-12-28 NOTE — Progress Notes (Signed)
RE: Madison [name] Date Of Birth: March 21, 1951 Date: 12/28/2022  MUST ID: [id number]  To Whom It May Concern:   Please be advised that the above name patient will require a short-term nursing home stay - anticipated 30 days or less rehabilitation and strengthening. The plan is for return home.

## 2022-12-28 NOTE — Procedures (Signed)
   Nephrology Nursing Note:   Hep B serologies obtained from CareEverywhere: IMMUNO CHEMISTRY (12/05/2022) Only the most recent of 3 results within the time period is included.   Results - IMMUNO CHEMISTRY (12/05/2022) Component Value Ref Range Test Method Analysis Time Performed At Pathologist Signature  Hep B Surface Ag Negative Negative     APS SPECTRA DUC     Results - IMMUNO CHEMISTRY (12/05/2022) Specimen (Source) Anatomical Location / Laterality Collection Method / Volume Collection Time Received Time        12/05/2022 12/06/2022 10:18 AM EDT      Arman Filter, RN

## 2022-12-28 NOTE — Plan of Care (Signed)
  Problem: Acute Rehab PT Goals(only PT should resolve) Goal: Pt Will Go Supine/Side To Sit Outcome: Progressing Flowsheets (Taken 12/28/2022 1203) Pt will go Supine/Side to Sit:  with min guard assist  with supervision Goal: Patient Will Transfer Sit To/From Stand Outcome: Progressing Flowsheets (Taken 12/28/2022 1203) Patient will transfer sit to/from stand:  with supervision  with min guard assist Goal: Pt Will Transfer Bed To Chair/Chair To Bed Outcome: Progressing Flowsheets (Taken 12/28/2022 1203) Pt will Transfer Bed to Chair/Chair to Bed:  with supervision  min guard assist Goal: Pt Will Ambulate Outcome: Progressing Flowsheets (Taken 12/28/2022 1203) Pt will Ambulate:  50 feet  with min guard assist  with minimal assist  with rolling walker   12:04 PM, 12/28/22 Ocie Bob, MPT Physical Therapist with [facility] [phone] office [phone] mobile phone

## 2022-12-28 NOTE — Evaluation (Signed)
Physical Therapy Evaluation Patient Details Name: Madison [name] MRN: [id number] DOB: 09/10/1951 Today's Date: 12/28/2022  History of Present Illness  Madison [name] is a 72 y.o. female with medical history significant for  ESRD, anemia, schizoaffective disorder.  Patient was sent to the ED from dialysis via EMS with reports of nausea vomiting and low blood pressure.  She received 2 hours of her planned 3hr 45 minutes session.  Blood pressure was 70/40.  Heart rate 98.  Monitor my evaluation patient is awake alert able to answer simple questions, she can tell me her name, denies abdominal pain chest pain cough or difficulty breathing, but she appears to have cognitive deficits or confusion and unable to tell me why she was brought here or where she is or her sister's name.      I talked to staff at the nursing home, she has cognitive deficits, mental status is at baseline, she has had good appetite over the past several days, no vomiting no loose stools no pain.  She still makes urine.   Clinical Impression  Patient demonstrates slow labored movement for sitting up at bedside, unsteady on feet when attempting transfers without AD, required use of RW for safety due BLE weakness and limited to walking in room due to c/o fatigue.  Patient tolerated sitting up on commode to have a bowel movement with sister present in room after therapy - nursing staff notified.  Patient will benefit from continued skilled physical therapy in hospital and recommended venue below to increase strength, balance, endurance for safe ADLs and gait.          Recommendations for follow up therapy are one component of a multi-disciplinary discharge planning process, led by the attending physician.  Recommendations may be updated based on patient status, additional functional criteria and insurance authorization.  Follow Up Recommendations Can patient physically be transported by private vehicle: Yes     Assistance Recommended  at Discharge Intermittent Supervision/Assistance  Patient can return home with the following  A little help with walking and/or transfers;A little help with bathing/dressing/bathroom;Help with stairs or ramp for entrance;Assistance with cooking/housework    Equipment Recommendations None recommended by PT  Recommendations for Other Services       Functional Status Assessment Patient has had a recent decline in their functional status and demonstrates the ability to make significant improvements in function in a reasonable and predictable amount of time.     Precautions / Restrictions Precautions Precautions: Fall Restrictions Weight Bearing Restrictions: No      Mobility  Bed Mobility Overal bed mobility: Needs Assistance Bed Mobility: Supine to Sit     Supine to sit: Min guard, Min assist     General bed mobility comments: increased time, labored movement    Transfers Overall transfer level: Needs assistance Equipment used: Rolling walker (2 wheels) Transfers: Sit to/from Stand, Bed to chair/wheelchair/BSC Sit to Stand: Min assist   Step pivot transfers: Min assist       General transfer comment: unsteady labored movement, required use of RW for safty    Ambulation/Gait Ambulation/Gait assistance: Min assist Gait Distance (Feet): 12 Feet Assistive device: Rolling walker (2 wheels) Gait Pattern/deviations: Decreased step length - right, Decreased step length - left, Decreased stride length Gait velocity: slow     General Gait Details: limited to ambulating in room with slow labored movement, limited mostly due to c/o fatigue and generalized weakness  Stairs  Wheelchair Mobility    Modified Rankin (Stroke Patients Only)       Balance Overall balance assessment: Needs assistance Sitting-balance support: Feet supported, No upper extremity supported Sitting balance-Leahy Scale: Fair Sitting balance - Comments: fair/good seated at EOB    Standing balance support: During functional activity, No upper extremity supported Standing balance-Leahy Scale: Poor Standing balance comment: fair using RW                             Pertinent Vitals/Pain Pain Assessment Pain Assessment: No/denies pain    Home Living Family/patient expects to be discharged to:: Assisted living                 Home Equipment: [employer] - single point      Prior Function Prior Level of Function : Needs assist       Physical Assist : Mobility (physical);ADLs (physical) Mobility (physical): Bed mobility;Transfers;Gait;Stairs   Mobility Comments: household ambulator with use of SPC PRN ADLs Comments: assisted by ALF staff     Hand Dominance   Dominant Hand: Right    Extremity/Trunk Assessment   Upper Extremity Assessment Upper Extremity Assessment: Generalized weakness    Lower Extremity Assessment Lower Extremity Assessment: Generalized weakness    Cervical / Trunk Assessment Cervical / Trunk Assessment: Normal  Communication   Communication: Expressive difficulties (possibly due to mild confusion)  Cognition Arousal/Alertness: Awake/alert Behavior During Therapy: WFL for tasks assessed/performed Overall Cognitive Status: History of cognitive impairments - at baseline                                          General Comments      Exercises     Assessment/Plan    PT Assessment Patient needs continued PT services  PT Problem List Decreased strength;Decreased activity tolerance;Decreased balance;Decreased mobility       PT Treatment Interventions DME instruction;Gait training;Stair training;Functional mobility training;Therapeutic activities;Therapeutic exercise;Patient/family education;Balance training    PT Goals (Current goals can be found in the Care Plan section)  Acute Rehab PT Goals Patient Stated Goal: return home PT Goal Formulation: With patient/family Time For Goal  Achievement: 01/11/23 Potential to Achieve Goals: Good    Frequency Min 3X/week     Co-evaluation               AM-PAC PT "6 Clicks" Mobility  Outcome Measure Help needed turning from your back to your side while in a flat bed without using bedrails?: A Little Help needed moving from lying on your back to sitting on the side of a flat bed without using bedrails?: A Little Help needed moving to and from a bed to a chair (including a wheelchair)?: A Little Help needed standing up from a chair using your arms (e.g., wheelchair or bedside chair)?: A Little Help needed to walk in hospital room?: A Lot Help needed climbing 3-5 steps with a railing? : A Lot 6 Click Score: 16    End of Session   Activity Tolerance: Patient tolerated treatment well;Patient limited by fatigue Patient left: with call bell/phone within reach;Other (comment);with family/visitor present (left sitting on commode in room) Nurse Communication: Mobility status PT Visit Diagnosis: Unsteadiness on feet (R26.81);Other abnormalities of gait and mobility (R26.89);Muscle weakness (generalized) (M62.81)    Time: [id number] PT Time Calculation (min) (ACUTE ONLY): 20 min   Charges:  PT Evaluation $PT Eval Moderate Complexity: 1 Mod PT Treatments $Therapeutic Activity: 8-22 mins        12:00 PM, 12/28/22 Ocie Bob, MPT Physical Therapist with [facility] [phone] office [phone] mobile phone

## 2022-12-28 NOTE — Progress Notes (Signed)
Subjective:  is sitting up in bed eating breakfast -  repeating "you guys are running me this am"  " I was not expecting this"  unable to tell me that she is in the hospital with prompting   Objective Vital signs in last 24 hours: Vitals:   12/28/22 0600 12/28/22 0615 12/28/22 0630 12/28/22 0645  BP: (!) 111/59 (!) 111/57 (!) 104/54 (!) 108/47  Pulse: 63 (!) 59 (!) 54 (!) 52  Resp: (!) 24 17 15 17   Temp:      TempSrc:      SpO2: 97% 97% 98% 98%  Weight:      Height:       Weight change: 2.8 kg  Intake/Output Summary (Last 24 hours) at 12/28/2022 0737 Last data filed at 12/28/2022 0558 Gross per 24 hour  Intake 1486.95 ml  Output --  Net 1486.95 ml    dialysis at [facility] in [place], MWF, 225 minutes, 2K /2.5 calcium, left upper extremity AV fistula for the access-  15 gauge- 500 BFR ? , estimated dry weight was 50.5 kg-  has been under. Hep  4000 bolus hect 7, 150 sensipar  Assessment/ Plan: Pt is a 72 y.o. yo female with ESRD who was admitted on 12/26/2022 with N/V and low BP req transient pressors    Assessment/Plan: 1. N/V/low BP -  being treated like sepsis of a GI source- given maxepime/vanc and flagyl-  transient pressors but now off 2. ESRD - normally MWF at [facility]-  planning for HD today on schedule via AVF-  does not appear to need volume removal  3. Anemia- hgb still at goal-  no ESA needed at this time 4. Secondary hyperparathyroidism-  will continue home hectorol and sensipar -- also has [place] on med list-  will resume if phos is high ( will check tomorrow) 5. HTN/volume- does not appear too overloaded even though weight is above EDW- will run even today -  is noted to be on midodrine as OP -  has been added here   Pt will not be physically seen over the weekend-  HD today and Monday if still here.  Call with questions   Cecille Aver    Labs: Basic Metabolic Panel: Recent Labs  Lab 12/26/22 1153 12/27/22 0512  NA 138 131*  K 3.7 3.8  CL  92* 94*  CO2 28 22  GLUCOSE 102* 88  BUN 41* 52*  CREATININE 6.03* 7.25*  CALCIUM 10.1 9.1   Liver Function Tests: Recent Labs  Lab 12/26/22 1153  AST 19  ALT 14  ALKPHOS 81  BILITOT 0.7  PROT 8.2*  ALBUMIN 3.9   No results for input(s): "LIPASE", "AMYLASE" in the last 168 hours. No results for input(s): "AMMONIA" in the last 168 hours. CBC: Recent Labs  Lab 12/26/22 1153 12/27/22 0512  WBC 9.9 5.1  HGB 14.1 11.3*  HCT 43.5 34.9*  MCV 100.0 100.3*  PLT 225 201   Cardiac Enzymes: No results for input(s): "CKTOTAL", "CKMB", "CKMBINDEX", "TROPONINI" in the last 168 hours. CBG: No results for input(s): "GLUCAP" in the last 168 hours.  Iron Studies: No results for input(s): "IRON", "TIBC", "TRANSFERRIN", "FERRITIN" in the last 72 hours. Studies/Results: ECHOCARDIOGRAM LIMITED  Result Date: 12/27/2022    ECHOCARDIOGRAM LIMITED REPORT   Patient Name:   SIBLEY GOTTSCHALL [facility] Date of Exam: 12/27/2022 Medical Rec #:  [id number]       Height:       64.0 in Accession #:  [id number]      Weight:       113.1 lb Date of Birth:  1951/07/16        BSA:          1.535 m Patient Age:    72 years        BP:           109/42 mmHg Patient Gender: F               HR:           67 bpm. [facility] Procedure: Limited Echo Indications:    Ventricular Tachycardia I47.2  History:        Patient has prior history of Echocardiogram examinations, most                 recent 10/04/2022. Previous Myocardial Infarction and CAD. ESRD                 on hemodialysis, Schizophrenia (HCC) (From Hx).  Sonographer:    Celesta Gentile RCS Referring Phys: [id number] PRATIK D [facility] IMPRESSIONS  1. Limited Echo for LVEF assessment.  2. Left ventricular ejection fraction, by estimation, is 60 to 65%. The left ventricle has normal function. The left ventricle has no regional wall motion abnormalities. Left ventricular diastolic function not assessed.  3. Right ventricular systolic function is normal. The right ventricular  size is normal. Tricuspid regurgitation signal is inadequate for assessing PA pressure.  4. The inferior vena cava is normal in size with greater than 50% respiratory variability, suggesting right atrial pressure of 3 mmHg. Comparison(s): No significant change from prior study. FINDINGS  Left Ventricle: Left ventricular ejection fraction, by estimation, is 60 to 65%. The left ventricle has normal function. The left ventricle has no regional wall motion abnormalities. The left ventricular internal cavity size was normal in size. There is  no left ventricular hypertrophy. Left ventricular diastolic function could not be evaluated. Right Ventricle: The right ventricular size is normal. Right vetricular wall thickness was not assessed. Right ventricular systolic function is normal. Tricuspid regurgitation signal is inadequate for assessing PA pressure. Left Atrium: Left atrial size was not assessed. Right Atrium: Right atrial size was not assessed. Pericardium: There is no evidence of pericardial effusion. Mitral Valve: The mitral valve is grossly normal. Tricuspid Valve: The tricuspid valve is not well visualized. Aortic Valve: The aortic valve was not well visualized. Pulmonic Valve: The pulmonic valve was not well visualized. Aorta: Aortic root could not be assessed. Venous: The inferior vena cava is normal in size with greater than 50% respiratory variability, suggesting right atrial pressure of 3 mmHg. IAS/Shunts: No atrial level shunt detected by color flow Doppler. LEFT VENTRICLE PLAX 2D LVIDd:         4.00 cm LVIDs:         2.50 cm LV PW:         1.10 cm LV IVS:        0.90 cm LVOT diam:     2.00 cm LVOT Area:     3.14 cm  RIGHT VENTRICLE TAPSE (M-mode): 2.4 cm LEFT ATRIUM         Index LA diam:    2.80 cm 1.82 cm/m   AORTA Ao Root diam: 2.90 cm  SHUNTS Systemic Diam: 2.00 cm Vishnu Priya Mallipeddi Electronically signed by Winfield Rast Mallipeddi Signature Date/Time: 12/27/2022/12:56:50 PM    Final    CT  Abdomen Pelvis W Contrast  Result Date:  12/26/2022 CLINICAL DATA:  Abdominal pain, nausea and vomiting EXAM: CT ABDOMEN AND PELVIS WITH CONTRAST TECHNIQUE: Multidetector CT imaging of the abdomen and pelvis was performed using the standard protocol following bolus administration of intravenous contrast. RADIATION DOSE REDUCTION: This exam was performed according to the departmental dose-optimization program which includes automated exposure control, adjustment of the mA and/or kV according to patient size and/or use of iterative reconstruction technique. CONTRAST:  59mL OMNIPAQUE IOHEXOL 300 MG/ML  SOLN COMPARISON:  12/26/2022 FINDINGS: Lower chest: No acute pleural or parenchymal lung disease. Hepatobiliary: Cholecystectomy. There is mild intrahepatic biliary duct dilation, which is nonspecific. The common bile duct is normal in caliber measuring 6 mm. No evidence of choledocholithiasis. The liver is otherwise unremarkable. Pancreas: Unremarkable. No pancreatic ductal dilatation or surrounding inflammatory changes. Spleen: Normal in size without focal abnormality. Adrenals/Urinary Tract: Bilateral renal cortical atrophy, right greater than left, consistent with end-stage renal disease. There are multiple bilateral renal hypodensities, with characteristics suggesting cysts. No specific imaging follow-up is recommended. No urinary tract calculi. The adrenals are normal. Bladder is decompressed, which limits its evaluation. Stomach/Bowel: No bowel obstruction or ileus. Administered oral contrast has progressed throughout the colon to the level of the rectum. Postsurgical changes from partial right hemicolectomy and reanastomosis. No evidence of bowel wall thickening or inflammatory change. Diverticulosis of the distal colon without evidence of diverticulitis. Vascular/Lymphatic: Aortic atherosclerosis. No enlarged abdominal or pelvic lymph nodes. Reproductive: Uterus and bilateral adnexa are unremarkable. Other: No  free fluid or free intraperitoneal gas. No abdominal wall hernia. Musculoskeletal: There are no acute or destructive bony lesions. Severe multilevel lumbar spondylosis. Reconstructed images demonstrate no additional findings. IMPRESSION: 1. No evidence of bowel obstruction or ileus. 2. Distal colonic diverticulosis without diverticulitis. 3. Nonspecific mild intrahepatic biliary duct dilation in this patient status post cholecystectomy. The common bile duct is normal in caliber. 4.  Aortic Atherosclerosis (ICD10-I70.0). Electronically Signed   By: Sharlet Salina M.D.   On: 12/26/2022 19:31   DG ABD ACUTE 2+V W 1V CHEST  Result Date: 12/26/2022 CLINICAL DATA:  Vomiting EXAM: DG ABDOMEN ACUTE WITH 1 VIEW CHEST COMPARISON:  04/05/2020 FINDINGS: There is no evidence of dilated bowel loops or free intraperitoneal air. Cholecystectomy clips. No radiopaque calculi or other significant radiographic abnormality is seen. Heart size and mediastinal contours are within normal limits. Both lungs are clear. Lumbar levocurvature. IMPRESSION: 1. Nonobstructive bowel gas pattern. 2. No acute cardiopulmonary disease. Electronically Signed   By: Duanne Guess D.O.   On: 12/26/2022 12:40   Medications: Infusions:  sodium chloride Stopped (12/28/22 0547)   ceFEPime (MAXIPIME) IV Stopped (12/27/22 2208)   metronidazole Stopped (12/27/22 2346)   norepinephrine (LEVOPHED) Adult infusion Stopped (12/28/22 0557)   vancomycin      Scheduled Medications:  Chlorhexidine Gluconate Cloth  6 each Topical Q0600   Chlorhexidine Gluconate Cloth  6 each Topical Q0600   heparin  5,000 Units Subcutaneous Q8H   midodrine  15 mg Oral BID   mupirocin ointment  1 Application Nasal BID   pantoprazole  40 mg Oral Daily   valproic acid  250 mg Oral TID WC    have reviewed scheduled and prn medications.  Physical Exam: General: NAD- confused-  according to my conversation with her OP HD unit this is her baseline -  she is oriented to  person only -  occasionally place Heart: RRR Lungs: mostly clear Abdomen: soft, non tender Extremities: no peripheral edema  Dialysis Access: left upper arm AVF-  good  thrill and bruit     12/28/2022,7:37 AM  LOS: 2 days

## 2022-12-28 NOTE — Progress Notes (Signed)
   Please refer to the full Cardiology consult note from 12/27/2022. Had episodes of Wenckebach yesterday afternoon. Telemetry overnight and this morning shows sinus bradycardia, HR in 50's to 60's and HR into the 90's at times. No evidence of CHB or ventricular arrhythmias. Continue to avoid AV nodal blocking agents. Norepinephrine was stopped this morning and she remains on Midodrine. Limited echo yesterday showed a normal EF with no significant changes from prior imaging. No additional Cardiology recommendations at this time. Please contact us if we can be of further assistance this admission.   [facility] will sign off.   Follow up as an outpatient: She has previously scheduled follow-up with Dr. Jenene Slicker later this month.   Signed, Ellsworth Lennox, PA-C 12/28/2022, 8:47 AM

## 2022-12-28 NOTE — Progress Notes (Signed)
PROGRESS NOTE    Madison [name]  DXA:[id number] DOB: [birth year] DOA: 12/26/2022 PCP: System, Provider Not In   Brief Narrative:    Madison [name] is a 72 y.o. female with medical history significant for  ESRD, anemia, schizoaffective disorder.  Patient was sent to the ED from dialysis via EMS with reports of nausea vomiting and low blood pressure.  She received 2 hours of her planned 3hr 45 minutes session.  Blood pressure was 70/40.  Heart rate 98.  She was admitted with SIRS criteria and no active signs of infection currently noted.  She was empirically started on IV antibiotics as noted below and given 1 L IV fluid bolus.  Assessment & Plan:   Principal Problem:   SIRS (systemic inflammatory response syndrome) Active Problems:   Bipolar affective disorder, current episode manic with psychotic symptoms   ESRD on hemodialysis  Assessment and Plan:  SIRS (systemic inflammatory response syndrome) likely secondary to rotavirus gastroenteritis Presenting with vomiting and hypotension.  Blood pressure systolic initially 80s, improved to low 100s with fluids at the time of my evaluation, now 80s again.  Also febrile to 100.6.  WBC 9.9.  Lactic acid 1.7.  Per nursing home- She still makes urine.   - Chest x-ray-unremarkable.   - CT abdomen and pelvis with contrast-  nonspecific mild intrahepatic biliary duct dilatation, status postcholecystectomy, normal common bile duct.   -ESRD-  need to rule out bacteremia -Currently with intractable nausea and vomiting -Blood cultures with no growth to date -Patient weaned off norepinephrine, continue midodrine  Hypotension -Improved with use of midodrine twice daily, currently weaned off norepinephrine -Monitor closely after hemodialysis   ESRD on hemodialysis Completed 2 hours of initial plan 3 hours 45 minutes of dialysis. -Plans for hemodialysis today -She is on midodrine 15 mg with dialysis, will change to twice daily  Second-degree Mobitz  type I AV block Avoid AV nodal agents Cardiology recommending outpatient OSA evaluation Bradycardia appears improved this a.m. and cardiology has signed off Follow-up with cardiology outpatient later this month   Bipolar affective disorder, current episode manic with psychotic symptoms Resume Depakote Discontinue oral Abilify as patient gets monthly shots    DVT prophylaxis:Heparin Code Status: Full Family Communication: Discussed with sister on phone 4/4 Disposition Plan:  Status is: Inpatient Remains inpatient appropriate because: Need for IV medications   Consultants:  Nephrology Cardiology  Procedures:  None  Antimicrobials:  Anti-infectives (From admission, onward)    Start     Dose/Rate Route Frequency Ordered Stop   12/28/22 1200  vancomycin (VANCOREADY) IVPB 500 mg/100 mL  Status:  Discontinued        500 mg 100 mL/hr over 60 Minutes Intravenous Every M-W-F (Hemodialysis) 12/26/22 2039 12/28/22 0909   12/27/22 2200  ceFEPIme (MAXIPIME) 1 g in sodium chloride 0.9 % 100 mL IVPB  Status:  Discontinued        1 g 200 mL/hr over 30 Minutes Intravenous Every 24 hours 12/27/22 0106 12/28/22 0909   12/26/22 2330  ceFEPIme (MAXIPIME) 2 g in sodium chloride 0.9 % 100 mL IVPB        2 g 200 mL/hr over 30 Minutes Intravenous  Once 12/26/22 2234 12/26/22 2327   12/26/22 2215  metroNIDAZOLE (FLAGYL) IVPB 500 mg  Status:  Discontinued        500 mg 100 mL/hr over 60 Minutes Intravenous Every 12 hours 12/26/22 2208 12/28/22 0909   12/26/22 2045  vancomycin (VANCOCIN) IVPB 1000 mg/200 mL premix  1,000 mg 200 mL/hr over 60 Minutes Intravenous  Once 12/26/22 2036 12/26/22 2256   12/26/22 2030  cefTRIAXone (ROCEPHIN) 1 g in sodium chloride 0.9 % 100 mL IVPB        1 g 200 mL/hr over 30 Minutes Intravenous  Once 12/26/22 2021 12/26/22 2139       Subjective: Patient seen and evaluated today with no new acute complaints or concerns. No acute concerns or events noted  overnight.  She is quite nauseous this morning and having ongoing vomiting.  She appears to have confusion at baseline.  Objective: Vitals:   12/28/22 0812 12/28/22 0830 12/28/22 0900 12/28/22 0930  BP:  (!) 103/38 (!) 106/46 (!) 104/38  Pulse:  (!) 59 (!) 56 (!) 50  Resp:  14 18 20   Temp: 97.8 F (36.6 C)     TempSrc: Oral     SpO2:  100% 97% 99%  Weight:      Height:        Intake/Output Summary (Last 24 hours) at 12/28/2022 1001 Last data filed at 12/28/2022 0835 Gross per 24 hour  Intake 1232.5 ml  Output 400 ml  Net 832.5 ml   Filed Weights   12/26/22 1039 12/26/22 2300 12/28/22 0530  Weight: 52.2 kg 51.3 kg 55 kg    Examination:  General exam: Appears calm and comfortable, confused Respiratory system: Clear to auscultation. Respiratory effort normal. Cardiovascular system: S1 & S2 heard, RRR.  Gastrointestinal system: Abdomen is soft Central nervous system: Alert and awake Extremities: No edema Skin: No significant lesions noted Psychiatry: Flat affect.    Data Reviewed: I have personally reviewed following labs and imaging studies  CBC: Recent Labs  Lab 12/26/22 1153 12/27/22 0512  WBC 9.9 5.1  HGB 14.1 11.3*  HCT 43.5 34.9*  MCV 100.0 100.3*  PLT 225 201   Basic Metabolic Panel: Recent Labs  Lab 12/26/22 1153 12/27/22 0512  NA 138 131*  K 3.7 3.8  CL 92* 94*  CO2 28 22  GLUCOSE 102* 88  BUN 41* 52*  CREATININE 6.03* 7.25*  CALCIUM 10.1 9.1  MG  --  1.8   GFR: Estimated Creatinine Clearance: 6.1 mL/min (A) (by C-G formula based on SCr of 7.25 mg/dL (H)). Liver Function Tests: Recent Labs  Lab 12/26/22 1153  AST 19  ALT 14  ALKPHOS 81  BILITOT 0.7  PROT 8.2*  ALBUMIN 3.9   No results for input(s): "LIPASE", "AMYLASE" in the last 168 hours. No results for input(s): "AMMONIA" in the last 168 hours. Coagulation Profile: No results for input(s): "INR", "PROTIME" in the last 168 hours. Cardiac Enzymes: No results for input(s):  "CKTOTAL", "CKMB", "CKMBINDEX", "TROPONINI" in the last 168 hours. BNP (last 3 results) No results for input(s): "PROBNP" in the last 8760 hours. HbA1C: No results for input(s): "HGBA1C" in the last 72 hours. CBG: No results for input(s): "GLUCAP" in the last 168 hours. Lipid Profile: No results for input(s): "CHOL", "HDL", "LDLCALC", "TRIG", "CHOLHDL", "LDLDIRECT" in the last 72 hours. Thyroid Function Tests: No results for input(s): "TSH", "T4TOTAL", "FREET4", "T3FREE", "THYROIDAB" in the last 72 hours. Anemia Panel: No results for input(s): "VITAMINB12", "FOLATE", "FERRITIN", "TIBC", "IRON", "RETICCTPCT" in the last 72 hours. Sepsis Labs: Recent Labs  Lab 12/26/22 1525 12/26/22 2301  PROCALCITON  --  1.66  LATICACIDVEN 1.7  --     Recent Results (from the past 240 hour(s))  Blood culture (routine x 2)     Status: None (Preliminary result)   Collection  Time: 12/26/22  3:25 PM   Specimen: BLOOD RIGHT ARM  Result Value Ref Range Status   Specimen Description BLOOD RIGHT ARM  Final   Special Requests   Final    BOTTLES DRAWN AEROBIC AND ANAEROBIC Blood Culture adequate volume   Culture   Final    NO GROWTH 2 DAYS Performed at [facility], [address] [place], [place] [address]    Report Status PENDING  Incomplete  Blood culture (routine x 2)     Status: None (Preliminary result)   Collection Time: 12/26/22  3:50 PM   Specimen: BLOOD RIGHT ARM  Result Value Ref Range Status   Specimen Description BLOOD RIGHT ARM  Final   Special Requests   Final    BOTTLES DRAWN AEROBIC AND ANAEROBIC Blood Culture adequate volume   Culture   Final    NO GROWTH 2 DAYS Performed at [facility], [address] [place], [place] [address]    Report Status PENDING  Incomplete  Gastrointestinal Panel by PCR , Stool     Status: Abnormal   Collection Time: 12/26/22  9:14 PM   Specimen: Stool  Result Value Ref Range Status   Campylobacter species NOT DETECTED NOT DETECTED Final    Plesimonas shigelloides NOT DETECTED NOT DETECTED Final   Salmonella species NOT DETECTED NOT DETECTED Final   Yersinia enterocolitica NOT DETECTED NOT DETECTED Final   Vibrio species NOT DETECTED NOT DETECTED Final   Vibrio cholerae NOT DETECTED NOT DETECTED Final   Enteroaggregative E coli (EAEC) NOT DETECTED NOT DETECTED Final   Enteropathogenic E coli (EPEC) NOT DETECTED NOT DETECTED Final   Enterotoxigenic E coli (ETEC) NOT DETECTED NOT DETECTED Final   Shiga like toxin producing E coli (STEC) NOT DETECTED NOT DETECTED Final   Shigella/Enteroinvasive E coli (EIEC) NOT DETECTED NOT DETECTED Final   Cryptosporidium NOT DETECTED NOT DETECTED Final   Cyclospora cayetanensis NOT DETECTED NOT DETECTED Final   Entamoeba histolytica NOT DETECTED NOT DETECTED Final   Giardia lamblia NOT DETECTED NOT DETECTED Final   Adenovirus F40/41 NOT DETECTED NOT DETECTED Final   Astrovirus NOT DETECTED NOT DETECTED Final   Norovirus GI/GII NOT DETECTED NOT DETECTED Final   Rotavirus A DETECTED (A) NOT DETECTED Final   Sapovirus (I, II, IV, and V) NOT DETECTED NOT DETECTED Final    Comment: Performed at [facility], [address] [place], [place] [address]  C Difficile Quick Screen w PCR reflex     Status: None   Collection Time: 12/26/22  9:14 PM   Specimen: Stool  Result Value Ref Range Status   C Diff antigen NEGATIVE NEGATIVE Final   C Diff toxin NEGATIVE NEGATIVE Final   C Diff interpretation No C. difficile detected.  Final    Comment: Performed at [facility], [address] [place], [place] [address]  MRSA Next Gen by PCR, Nasal     Status: Abnormal   Collection Time: 12/26/22 10:40 PM   Specimen: Nasal Mucosa; Nasal Swab  Result Value Ref Range Status   MRSA by PCR Next Gen DETECTED (A) NOT DETECTED Final    Comment: RESULT CALLED TO, READ BACK BY AND VERIFIED WITH: S.BHATTARAI AT 0305 ON 04.04.24 BY ADGER J         The GeneXpert MRSA Assay (FDA approved for NASAL  specimens only), is one component of a comprehensive MRSA colonization surveillance program. It is not intended to diagnose MRSA infection nor to guide or monitor treatment for MRSA infections.  Performed at [facility], [address] [place], [place] [address]          Radiology Studies: ECHOCARDIOGRAM LIMITED  Result Date: 12/27/2022    ECHOCARDIOGRAM LIMITED REPORT   Patient Name:   JYL CHICO [facility] Date of Exam: 12/27/2022 Medical Rec #:  [id number]       Height:       64.0 in Accession #:    [id number]      Weight:       113.1 lb Date of Birth:  03-10-1951        BSA:          1.535 m Patient Age:    71 years        BP:           109/42 mmHg Patient Gender: F               HR:           67 bpm. [facility]am [facility] Procedure: Limited Echo Indications:    Ventricular Tachycardia I47.2  History:        Patient has prior history of Echocardiogram examinations, most                 recent 10/04/2022. Previous Myocardial Infarction and CAD. ESRD                 on hemodialysis, Schizophrenia (HCC) (From Hx).  Sonographer:    Celesta Gentile RCS Referring Phys: [id number] Finnian Husted D [facility] IMPRESSIONS  1. Limited Echo for LVEF assessment.  2. Left ventricular ejection fraction, by estimation, is 60 to 65%. The left ventricle has normal function. The left ventricle has no regional wall motion abnormalities. Left ventricular diastolic function not assessed.  3. Right ventricular systolic function is normal. The right ventricular size is normal. Tricuspid regurgitation signal is inadequate for assessing PA pressure.  4. The inferior vena cava is normal in size with greater than 50% respiratory variability, suggesting right atrial pressure of 3 mmHg. Comparison(s): No significant change from prior study. FINDINGS  Left Ventricle: Left ventricular ejection fraction, by estimation, is 60 to 65%. The left ventricle has normal function. The left ventricle has no regional wall motion abnormalities. The left  ventricular internal cavity size was normal in size. There is  no left ventricular hypertrophy. Left ventricular diastolic function could not be evaluated. Right Ventricle: The right ventricular size is normal. Right vetricular wall thickness was not assessed. Right ventricular systolic function is normal. Tricuspid regurgitation signal is inadequate for assessing PA pressure. Left Atrium: Left atrial size was not assessed. Right Atrium: Right atrial size was not assessed. Pericardium: There is no evidence of pericardial effusion. Mitral Valve: The mitral valve is grossly normal. Tricuspid Valve: The tricuspid valve is not well visualized. Aortic Valve: The aortic valve was not well visualized. Pulmonic Valve: The pulmonic valve was not well visualized. Aorta: Aortic root could not be assessed. Venous: The inferior vena cava is normal in size with greater than 50% respiratory variability, suggesting right atrial pressure of 3 mmHg. IAS/Shunts: No atrial level shunt detected by color flow Doppler. LEFT VENTRICLE PLAX 2D LVIDd:         4.00 cm LVIDs:         2.50 cm LV PW:         1.10 cm LV IVS:        0.90 cm LVOT diam:     2.00 cm LVOT Area:  3.14 cm  RIGHT VENTRICLE TAPSE (M-mode): 2.4 cm LEFT ATRIUM         Index LA diam:    2.80 cm 1.82 cm/m   AORTA Ao Root diam: 2.90 cm  SHUNTS Systemic Diam: 2.00 cm Vishnu Priya Mallipeddi Electronically signed by Winfield Rast Mallipeddi Signature Date/Time: 12/27/2022/12:56:50 PM    Final    CT Abdomen Pelvis W Contrast  Result Date: 12/26/2022 CLINICAL DATA:  Abdominal pain, nausea and vomiting EXAM: CT ABDOMEN AND PELVIS WITH CONTRAST TECHNIQUE: Multidetector CT imaging of the abdomen and pelvis was performed using the standard protocol following bolus administration of intravenous contrast. RADIATION DOSE REDUCTION: This exam was performed according to the departmental dose-optimization program which includes automated exposure control, adjustment of the mA and/or kV  according to patient size and/or use of iterative reconstruction technique. CONTRAST:  75mL OMNIPAQUE IOHEXOL 300 MG/ML  SOLN COMPARISON:  12/26/2022 FINDINGS: Lower chest: No acute pleural or parenchymal lung disease. Hepatobiliary: Cholecystectomy. There is mild intrahepatic biliary duct dilation, which is nonspecific. The common bile duct is normal in caliber measuring 6 mm. No evidence of choledocholithiasis. The liver is otherwise unremarkable. Pancreas: Unremarkable. No pancreatic ductal dilatation or surrounding inflammatory changes. Spleen: Normal in size without focal abnormality. Adrenals/Urinary Tract: Bilateral renal cortical atrophy, right greater than left, consistent with end-stage renal disease. There are multiple bilateral renal hypodensities, with characteristics suggesting cysts. No specific imaging follow-up is recommended. No urinary tract calculi. The adrenals are normal. Bladder is decompressed, which limits its evaluation. Stomach/Bowel: No bowel obstruction or ileus. Administered oral contrast has progressed throughout the colon to the level of the rectum. Postsurgical changes from partial right hemicolectomy and reanastomosis. No evidence of bowel wall thickening or inflammatory change. Diverticulosis of the distal colon without evidence of diverticulitis. Vascular/Lymphatic: Aortic atherosclerosis. No enlarged abdominal or pelvic lymph nodes. Reproductive: Uterus and bilateral adnexa are unremarkable. Other: No free fluid or free intraperitoneal gas. No abdominal wall hernia. Musculoskeletal: There are no acute or destructive bony lesions. Severe multilevel lumbar spondylosis. Reconstructed images demonstrate no additional findings. IMPRESSION: 1. No evidence of bowel obstruction or ileus. 2. Distal colonic diverticulosis without diverticulitis. 3. Nonspecific mild intrahepatic biliary duct dilation in this patient status post cholecystectomy. The common bile duct is normal in caliber. 4.   Aortic Atherosclerosis (ICD10-I70.0). Electronically Signed   By: Sharlet Salina M.D.   On: 12/26/2022 19:31   DG ABD ACUTE 2+V W 1V CHEST  Result Date: 12/26/2022 CLINICAL DATA:  Vomiting EXAM: DG ABDOMEN ACUTE WITH 1 VIEW CHEST COMPARISON:  04/05/2020 FINDINGS: There is no evidence of dilated bowel loops or free intraperitoneal air. Cholecystectomy clips. No radiopaque calculi or other significant radiographic abnormality is seen. Heart size and mediastinal contours are within normal limits. Both lungs are clear. Lumbar levocurvature. IMPRESSION: 1. Nonobstructive bowel gas pattern. 2. No acute cardiopulmonary disease. Electronically Signed   By: Duanne Guess D.O.   On: 12/26/2022 12:40        Scheduled Meds:  Chlorhexidine Gluconate Cloth  6 each Topical Q0600   Chlorhexidine Gluconate Cloth  6 each Topical Q0600   cinacalcet  150 mg Oral Q M,W,F-HD   doxercalciferol  7 mcg Intravenous Q M,W,F-HD   heparin  5,000 Units Subcutaneous Q8H   midodrine  15 mg Oral BID   mupirocin ointment  1 Application Nasal BID   pantoprazole  40 mg Oral Daily   valproic acid  250 mg Oral TID WC   Continuous Infusions:  sodium chloride Stopped (  12/28/22 0547)   norepinephrine (LEVOPHED) Adult infusion Stopped (12/28/22 0557)     LOS: 2 days    Time spent: 35 minutes    Tam Savoia D Sherryll Burger, DO [facility]  If 7PM-7AM, please contact night-coverage [email or web address] 12/28/2022, 10:01 AM

## 2022-12-28 NOTE — Progress Notes (Signed)
   HEMODIALYSIS TREATMENT NOTE:  3.5 hour heparin-free treatment completed using left upper arm AVF (15g/antegrade). Goal met: 1 liter removed without interruption in UF.  Hemodynamically stable throughout session.  All blood was returned.  Prolonged bleeding after needle removal; hemostasis was achieved in 35 minutes.  Post-dialysis:  12/28/22 2243  Vitals  Temp 97.9 F (36.6 C)  Temp Source Oral  BP (!) 112/59  MAP (mmHg) 76  BP Location Right Wrist  BP Method Automatic  Patient Position (if appropriate) Lying  Pulse Rate 91  Pulse Rate Source Monitor  ECG Heart Rate 90  Resp 18  Oxygen Therapy  SpO2 97 %  O2 Device Room Air  Post Treatment  Dialyzer Clearance Lightly streaked  Duration of HD Treatment -hour(s) 3.5 hour(s)  Hemodialysis Intake (mL) 0 mL  Liters Processed 77  Fluid Removed (mL) 1000 mL  Tolerated HD Treatment Yes  Post-Hemodialysis Comments Goal met  AVG/AVF Arterial Site Held (minutes) 20 minutes  AVG/AVF Venous Site Held (minutes) 15 minutes  Fistula / Graft Left Upper arm Arteriovenous fistula  Placement Date/Time: 04/06/20 0000   Orientation: Left  Access Location: Upper arm  Access Type: Arteriovenous fistula  Fistula / Graft Assessment Thrill;Bruit  Status Patent    Arman Filter, RN AP KDU

## 2022-12-29 DIAGNOSIS — R6510 Systemic inflammatory response syndrome (SIRS) of non-infectious origin without acute organ dysfunction: Secondary | ICD-10-CM | POA: Diagnosis not present

## 2022-12-29 LAB — RENAL FUNCTION PANEL
Albumin: 3 g/dL — ABNORMAL LOW (ref 3.5–5.0)
Anion gap: 12 (ref 5–15)
BUN: 25 mg/dL — ABNORMAL HIGH (ref 8–23)
CO2: 24 mmol/L (ref 22–32)
Calcium: 9 mg/dL (ref 8.9–10.3)
Chloride: 95 mmol/L — ABNORMAL LOW (ref 98–111)
Creatinine, Ser: 5.21 mg/dL — ABNORMAL HIGH (ref 0.44–1.00)
GFR, Estimated: 8 mL/min — ABNORMAL LOW (ref >60)
Glucose, Bld: 83 mg/dL (ref 70–99)
Phosphorus: 5.1 mg/dL — ABNORMAL HIGH (ref 2.5–4.6)
Potassium: 3.5 mmol/L (ref 3.5–5.1)
Sodium: 131 mmol/L — ABNORMAL LOW (ref 135–145)

## 2022-12-29 LAB — CBC
HCT: 39 % (ref 36.0–46.0)
Hemoglobin: 12.9 g/dL (ref 12.0–15.0)
MCH: 32.8 pg (ref 26.0–34.0)
MCHC: 33.1 g/dL (ref 30.0–36.0)
MCV: 99.2 fL (ref 80.0–100.0)
Platelets: ADEQUATE 10*3/uL (ref 150–400)
RBC: 3.93 MIL/uL (ref 3.87–5.11)
RDW: 18.3 % — ABNORMAL HIGH (ref 11.5–15.5)
WBC: 4.2 10*3/uL (ref 4.0–10.5)
nRBC: 0 % (ref 0.0–0.2)

## 2022-12-29 LAB — MAGNESIUM: Magnesium: 1.6 mg/dL — ABNORMAL LOW (ref 1.7–2.4)

## 2022-12-29 LAB — HEPATITIS B SURFACE ANTIGEN: Hepatitis B Surface Ag: NONREACTIVE

## 2022-12-29 MED ORDER — MAGNESIUM SULFATE IN D5W 1-5 GM/100ML-% IV SOLN
1.0000 g | Freq: Once | INTRAVENOUS | Status: AC
  Administered 2022-12-29: 1 g via INTRAVENOUS
  Filled 2022-12-29: qty 100

## 2022-12-29 NOTE — Progress Notes (Signed)
PROGRESS NOTE    Madison [name]  EXH:[id number] DOB: December 03, 1950 DOA: 12/26/2022 PCP: System, Provider Not In   Brief Narrative:    Madison [name] is a 72 y.o. female with medical history significant for  ESRD, anemia, schizoaffective disorder.  Patient was sent to the ED from dialysis via EMS with reports of nausea vomiting and low blood pressure.  She received 2 hours of her planned 3hr 45 minutes session.  Blood pressure was 70/40.  Heart rate 98.  She was admitted with SIRS criteria and no active signs of infection currently noted.  She was empirically started on IV antibiotics as noted below and given 1 L IV fluid bolus.  Assessment & Plan:   Principal Problem:   SIRS (systemic inflammatory response syndrome) Active Problems:   Bipolar affective disorder, current episode manic with psychotic symptoms   ESRD on hemodialysis  Assessment and Plan:  SIRS (systemic inflammatory response syndrome) likely secondary to rotavirus gastroenteritis Presenting with vomiting and hypotension.  Blood pressure systolic initially 80s, improved to low 100s with fluids at the time of my evaluation, now 80s again.  Also febrile to 100.6.  WBC 9.9.  Lactic acid 1.7.  Per nursing home- She still makes urine.   - Chest x-ray-unremarkable.   - CT abdomen and pelvis with contrast-  nonspecific mild intrahepatic biliary duct dilatation, status postcholecystectomy, normal common bile duct.   -ESRD-  need to rule out bacteremia -Currently with intractable nausea and vomiting -Blood cultures with no growth to date -Patient weaned off norepinephrine, continue midodrine  Hypotension-resolved -Improved with use of midodrine twice daily, currently weaned off norepinephrine -Monitor closely after hemodialysis   ESRD on hemodialysis Completed 2 hours of initial plan 3 hours 45 minutes of dialysis. -Plans for hemodialysis today -Continue midodrine 15 mg twice daily  Second-degree Mobitz type I AV block Avoid  AV nodal agents Cardiology recommending outpatient OSA evaluation Bradycardia appears stable and resolved-okay to transfer to telemetry Follow-up with cardiology outpatient later this month   Bipolar affective disorder, current episode manic with psychotic symptoms Resume Depakote Discontinue oral Abilify as patient gets monthly shots    DVT prophylaxis:Heparin Code Status: Full Family Communication: Discussed with sisters at bedside 4/6 Disposition Plan:  Status is: Inpatient Remains inpatient appropriate because: Need for IV medications   Consultants:  Nephrology Cardiology  Procedures:  None  Antimicrobials:  Anti-infectives (From admission, onward)    Start     Dose/Rate Route Frequency Ordered Stop   12/28/22 1200  vancomycin (VANCOREADY) IVPB 500 mg/100 mL  Status:  Discontinued        500 mg 100 mL/hr over 60 Minutes Intravenous Every M-W-F (Hemodialysis) 12/26/22 2039 12/28/22 0909   12/27/22 2200  ceFEPIme (MAXIPIME) 1 g in sodium chloride 0.9 % 100 mL IVPB  Status:  Discontinued        1 g 200 mL/hr over 30 Minutes Intravenous Every 24 hours 12/27/22 0106 12/28/22 0909   12/26/22 2330  ceFEPIme (MAXIPIME) 2 g in sodium chloride 0.9 % 100 mL IVPB        2 g 200 mL/hr over 30 Minutes Intravenous  Once 12/26/22 2234 12/26/22 2327   12/26/22 2215  metroNIDAZOLE (FLAGYL) IVPB 500 mg  Status:  Discontinued        500 mg 100 mL/hr over 60 Minutes Intravenous Every 12 hours 12/26/22 2208 12/28/22 0909   12/26/22 2045  vancomycin (VANCOCIN) IVPB 1000 mg/200 mL premix        1,000 mg  200 mL/hr over 60 Minutes Intravenous  Once 12/26/22 2036 12/26/22 2256   12/26/22 2030  cefTRIAXone (ROCEPHIN) 1 g in sodium chloride 0.9 % 100 mL IVPB        1 g 200 mL/hr over 30 Minutes Intravenous  Once 12/26/22 2021 12/26/22 2139       Subjective: Patient seen and evaluated today with no new acute complaints or concerns. No acute concerns or events noted overnight.  She is quite  nauseous this morning and having ongoing vomiting.  She appears to have confusion at baseline.  Objective: Vitals:   12/29/22 0700 12/29/22 0800 12/29/22 0804 12/29/22 0900  BP: (!) 140/74 124/60  (!) 127/59  Pulse: 85 70  62  Resp: (!) 21 15  18   Temp:   (!) 97 F (36.1 C)   TempSrc:   Axillary   SpO2: 99% 98%  99%  Weight:      Height:        Intake/Output Summary (Last 24 hours) at 12/29/2022 1026 Last data filed at 12/29/2022 0816 Gross per 24 hour  Intake 157.83 ml  Output 1000 ml  Net -842.17 ml   Filed Weights   12/26/22 2300 12/28/22 0530 12/28/22 1820  Weight: 51.3 kg 55 kg 55.2 kg    Examination:  General exam: Appears calm and comfortable, confused Respiratory system: Clear to auscultation. Respiratory effort normal. Cardiovascular system: S1 & S2 heard, RRR.  Gastrointestinal system: Abdomen is soft Central nervous system: Alert and awake Extremities: No edema Skin: No significant lesions noted Psychiatry: Flat affect.    Data Reviewed: I have personally reviewed following labs and imaging studies  CBC: Recent Labs  Lab 12/26/22 1153 12/27/22 0512 12/29/22 0416  WBC 9.9 5.1 4.2  HGB 14.1 11.3* 12.9  HCT 43.5 34.9* 39.0  MCV 100.0 100.3* 99.2  PLT 225 201 PLATELETS APPEAR ADEQUATE   Basic Metabolic Panel: Recent Labs  Lab 12/26/22 1153 12/27/22 0512 12/29/22 0416  NA 138 131* 131*  K 3.7 3.8 3.5  CL 92* 94* 95*  CO2 28 22 24   GLUCOSE 102* 88 83  BUN 41* 52* 25*  CREATININE 6.03* 7.25* 5.21*  CALCIUM 10.1 9.1 9.0  MG  --  1.8 1.6*  PHOS  --   --  5.1*   GFR: Estimated Creatinine Clearance: 8.4 mL/min (A) (by C-G formula based on SCr of 5.21 mg/dL (H)). Liver Function Tests: Recent Labs  Lab 12/26/22 1153 12/29/22 0416  AST 19  --   ALT 14  --   ALKPHOS 81  --   BILITOT 0.7  --   PROT 8.2*  --   ALBUMIN 3.9 3.0*   No results for input(s): "LIPASE", "AMYLASE" in the last 168 hours. No results for input(s): "AMMONIA" in the  last 168 hours. Coagulation Profile: No results for input(s): "INR", "PROTIME" in the last 168 hours. Cardiac Enzymes: No results for input(s): "CKTOTAL", "CKMB", "CKMBINDEX", "TROPONINI" in the last 168 hours. BNP (last 3 results) No results for input(s): "PROBNP" in the last 8760 hours. HbA1C: No results for input(s): "HGBA1C" in the last 72 hours. CBG: No results for input(s): "GLUCAP" in the last 168 hours. Lipid Profile: No results for input(s): "CHOL", "HDL", "LDLCALC", "TRIG", "CHOLHDL", "LDLDIRECT" in the last 72 hours. Thyroid Function Tests: No results for input(s): "TSH", "T4TOTAL", "FREET4", "T3FREE", "THYROIDAB" in the last 72 hours. Anemia Panel: No results for input(s): "VITAMINB12", "FOLATE", "FERRITIN", "TIBC", "IRON", "RETICCTPCT" in the last 72 hours. Sepsis Labs: Recent Labs  Lab  12/26/22 1525 12/26/22 2301  PROCALCITON  --  1.66  LATICACIDVEN 1.7  --     Recent Results (from the past 240 hour(s))  Blood culture (routine x 2)     Status: None (Preliminary result)   Collection Time: 12/26/22  3:25 PM   Specimen: BLOOD RIGHT ARM  Result Value Ref Range Status   Specimen Description BLOOD RIGHT ARM  Final   Special Requests   Final    BOTTLES DRAWN AEROBIC AND ANAEROBIC Blood Culture adequate volume   Culture   Final    NO GROWTH 3 DAYS Performed at [facility], [address], [place], [place] [address]    Report Status PENDING  Incomplete  Blood culture (routine x 2)     Status: None (Preliminary result)   Collection Time: 12/26/22  3:50 PM   Specimen: BLOOD RIGHT ARM  Result Value Ref Range Status   Specimen Description BLOOD RIGHT ARM  Final   Special Requests   Final    BOTTLES DRAWN AEROBIC AND ANAEROBIC Blood Culture adequate volume   Culture   Final    NO GROWTH 3 DAYS Performed at [facility], [address], [place], [place] [address]    Report Status PENDING  Incomplete  Gastrointestinal Panel by PCR , Stool     Status: Abnormal    Collection Time: 12/26/22  9:14 PM   Specimen: Stool  Result Value Ref Range Status   Campylobacter species NOT DETECTED NOT DETECTED Final   Plesimonas shigelloides NOT DETECTED NOT DETECTED Final   Salmonella species NOT DETECTED NOT DETECTED Final   Yersinia enterocolitica NOT DETECTED NOT DETECTED Final   Vibrio species NOT DETECTED NOT DETECTED Final   Vibrio cholerae NOT DETECTED NOT DETECTED Final   Enteroaggregative E coli (EAEC) NOT DETECTED NOT DETECTED Final   Enteropathogenic E coli (EPEC) NOT DETECTED NOT DETECTED Final   Enterotoxigenic E coli (ETEC) NOT DETECTED NOT DETECTED Final   Shiga like toxin producing E coli (STEC) NOT DETECTED NOT DETECTED Final   Shigella/Enteroinvasive E coli (EIEC) NOT DETECTED NOT DETECTED Final   Cryptosporidium NOT DETECTED NOT DETECTED Final   Cyclospora cayetanensis NOT DETECTED NOT DETECTED Final   Entamoeba histolytica NOT DETECTED NOT DETECTED Final   Giardia lamblia NOT DETECTED NOT DETECTED Final   Adenovirus F40/41 NOT DETECTED NOT DETECTED Final   Astrovirus NOT DETECTED NOT DETECTED Final   Norovirus GI/GII NOT DETECTED NOT DETECTED Final   Rotavirus A DETECTED (A) NOT DETECTED Final   Sapovirus (I, II, IV, and V) NOT DETECTED NOT DETECTED Final    Comment: Performed at [facility], [address] [place], [place] [address]  C Difficile Quick Screen w PCR reflex     Status: None   Collection Time: 12/26/22  9:14 PM   Specimen: Stool  Result Value Ref Range Status   C Diff antigen NEGATIVE NEGATIVE Final   C Diff toxin NEGATIVE NEGATIVE Final   C Diff interpretation No C. difficile detected.  Final    Comment: Performed at [facility], [address], [place], [place] [address]  MRSA Next Gen by PCR, Nasal     Status: Abnormal   Collection Time: 12/26/22 10:40 PM   Specimen: Nasal Mucosa; Nasal Swab  Result Value Ref Range Status   MRSA by PCR Next Gen DETECTED (A) NOT DETECTED Final    Comment: RESULT CALLED TO,  READ BACK BY AND VERIFIED WITH: S.BHATTARAI AT 0305 ON 04.04.24 BY ADGER J  The GeneXpert MRSA Assay (FDA approved for NASAL specimens only), is one component of a comprehensive MRSA colonization surveillance program. It is not intended to diagnose MRSA infection nor to guide or monitor treatment for MRSA infections. Performed at [facility], [address], [place], [place] [address]          Radiology Studies: ECHOCARDIOGRAM LIMITED  Result Date: 12/27/2022    ECHOCARDIOGRAM LIMITED REPORT   Patient Name:   Madison [name] [facility] Date of Exam: 12/27/2022 Medical Rec #:  [id number]       Height:       64.0 in Accession #:    [id number]      Weight:       113.1 lb Date of Birth:  02-14-1951        BSA:          1.535 m Patient Age:    71 years        BP:           109/42 mmHg Patient Gender: F               HR:           67 bpm. [facility] Procedure: Limited Echo Indications:    Ventricular Tachycardia I47.2  History:        Patient has prior history of Echocardiogram examinations, most                 recent 10/04/2022. Previous Myocardial Infarction and CAD. ESRD                 on hemodialysis, Schizophrenia (HCC) (From Hx).  Sonographer:    Celesta Gentile RCS Referring Phys: [id number] Stirling Orton D [facility] IMPRESSIONS  1. Limited Echo for LVEF assessment.  2. Left ventricular ejection fraction, by estimation, is 60 to 65%. The left ventricle has normal function. The left ventricle has no regional wall motion abnormalities. Left ventricular diastolic function not assessed.  3. Right ventricular systolic function is normal. The right ventricular size is normal. Tricuspid regurgitation signal is inadequate for assessing PA pressure.  4. The inferior vena cava is normal in size with greater than 50% respiratory variability, suggesting right atrial pressure of 3 mmHg. Comparison(s): No significant change from prior study. FINDINGS  Left Ventricle: Left ventricular ejection fraction, by  estimation, is 60 to 65%. The left ventricle has normal function. The left ventricle has no regional wall motion abnormalities. The left ventricular internal cavity size was normal in size. There is  no left ventricular hypertrophy. Left ventricular diastolic function could not be evaluated. Right Ventricle: The right ventricular size is normal. Right vetricular wall thickness was not assessed. Right ventricular systolic function is normal. Tricuspid regurgitation signal is inadequate for assessing PA pressure. Left Atrium: Left atrial size was not assessed. Right Atrium: Right atrial size was not assessed. Pericardium: There is no evidence of pericardial effusion. Mitral Valve: The mitral valve is grossly normal. Tricuspid Valve: The tricuspid valve is not well visualized. Aortic Valve: The aortic valve was not well visualized. Pulmonic Valve: The pulmonic valve was not well visualized. Aorta: Aortic root could not be assessed. Venous: The inferior vena cava is normal in size with greater than 50% respiratory variability, suggesting right atrial pressure of 3 mmHg. IAS/Shunts: No atrial level shunt detected by color flow Doppler. LEFT VENTRICLE PLAX 2D LVIDd:         4.00 cm LVIDs:         2.50 cm  LV PW:         1.10 cm LV IVS:        0.90 cm LVOT diam:     2.00 cm LVOT Area:     3.14 cm  RIGHT VENTRICLE TAPSE (M-mode): 2.4 cm LEFT ATRIUM         Index LA diam:    2.80 cm 1.82 cm/m   AORTA Ao Root diam: 2.90 cm  SHUNTS Systemic Diam: 2.00 cm Vishnu Priya Mallipeddi Electronically signed by Winfield Rast Mallipeddi Signature Date/Time: 12/27/2022/12:56:50 PM    Final         Scheduled Meds:  Chlorhexidine Gluconate Cloth  6 each Topical Q0600   Chlorhexidine Gluconate Cloth  6 each Topical Q0600   cinacalcet  150 mg Oral Q M,W,F-HD   doxercalciferol  7 mcg Intravenous Q M,W,F-HD   heparin  5,000 Units Subcutaneous Q8H   midodrine  15 mg Oral BID   mupirocin ointment  1 Application Nasal BID    pantoprazole  40 mg Oral Daily   valproic acid  250 mg Oral TID WC   Continuous Infusions:  sodium chloride Stopped (12/28/22 1306)   magnesium sulfate bolus IVPB 1 g (12/29/22 0934)   norepinephrine (LEVOPHED) Adult infusion Stopped (12/28/22 0557)     LOS: 3 days    Time spent: 35 minutes    Capone Schwinn D Sherryll Burger, DO [facility]  If 7PM-7AM, please contact night-coverage [email or web address] 12/29/2022, 10:26 AM

## 2022-12-29 NOTE — Progress Notes (Signed)
Patient didn't sleep good during the night tonight, vitals stable throughout the night, her mentation seems to be downgrading more, as she was disoriented *4, she was talking to herself and tearful and withdrawn at times, sometimes agitated, tried getting out of bed unassisted, had type 7 liquid stools *3, reoriented her, call bell within reach, bed height lowered, bed alarms on, endorsed to oncoming RN.

## 2022-12-30 DIAGNOSIS — R6510 Systemic inflammatory response syndrome (SIRS) of non-infectious origin without acute organ dysfunction: Secondary | ICD-10-CM | POA: Diagnosis not present

## 2022-12-30 LAB — BASIC METABOLIC PANEL
Anion gap: 14 (ref 5–15)
BUN: 33 mg/dL — ABNORMAL HIGH (ref 8–23)
CO2: 22 mmol/L (ref 22–32)
Calcium: 7.9 mg/dL — ABNORMAL LOW (ref 8.9–10.3)
Chloride: 95 mmol/L — ABNORMAL LOW (ref 98–111)
Creatinine, Ser: 7.45 mg/dL — ABNORMAL HIGH (ref 0.44–1.00)
GFR, Estimated: 5 mL/min — ABNORMAL LOW (ref >60)
Glucose, Bld: 73 mg/dL (ref 70–99)
Potassium: 3.8 mmol/L (ref 3.5–5.1)
Sodium: 131 mmol/L — ABNORMAL LOW (ref 135–145)

## 2022-12-30 LAB — CBC
HCT: 43 % (ref 36.0–46.0)
Hemoglobin: 13.1 g/dL (ref 12.0–15.0)
MCH: 31.8 pg (ref 26.0–34.0)
MCHC: 30.5 g/dL (ref 30.0–36.0)
MCV: 104.4 fL — ABNORMAL HIGH (ref 80.0–100.0)
Platelets: 189 10*3/uL (ref 150–400)
RBC: 4.12 MIL/uL (ref 3.87–5.11)
RDW: 18.6 % — ABNORMAL HIGH (ref 11.5–15.5)
WBC: 3.7 10*3/uL — ABNORMAL LOW (ref 4.0–10.5)
nRBC: 0 % (ref 0.0–0.2)

## 2022-12-30 LAB — MAGNESIUM: Magnesium: 2.2 mg/dL (ref 1.7–2.4)

## 2022-12-30 MED ORDER — MIDODRINE HCL 5 MG PO TABS: 15.0000 mg | ORAL_TABLET | Freq: Two times a day (BID) | ORAL | 0 refills | Status: AC

## 2022-12-30 MED ORDER — LOPERAMIDE HCL 2 MG PO CAPS: 2.0000 mg | ORAL_CAPSULE | ORAL | 0 refills | Status: DC | PRN

## 2022-12-30 MED ORDER — ONDANSETRON HCL 4 MG PO TABS: 4.0000 mg | ORAL_TABLET | Freq: Every day | ORAL | 1 refills | Status: DC | PRN

## 2022-12-30 NOTE — TOC Transition Note (Signed)
Transition of Care [facility]) - CM/SW Discharge Note   Patient Details  Name: Madison [name] MRN: [id number] Date of Birth: [birth year]  Transition of Care [facility]) CM/SW Contact:  Catalina Gravel, LCSW Phone Number: 12/30/2022, 10:46 AM   Clinical Narrative:    CSW completed Med Necc form and placed pt on EMS list. Christiana Fuchs at facility, shared room/report info with nurse staff. Provided HD schedule to SNF- MWF 6AM. Contacted sister B Abad to advise of planned DC/transport today.  Secure chat to HD nurse that if pt here Monday- will need early HD so available for EMS transport to her SNF.   Final next level of care: Skilled Nursing Facility Barriers to Discharge: Continued Medical Work up, No Barriers Identified   Patient Goals and CMS Choice   Choice offered to / list presented to : Sibling  Discharge Placement                Patient chooses bed at: Other - please specify in the comment section below: [facility]) Patient to be transferred to facility by: EMS/[place] Name of family member notified: Basilisa Bozarth Patient and family notified of of transfer: 12/30/22  Discharge Plan and Services Additional resources added to the After Visit Summary for   In-house Referral: Clinical Social Work                                   Social Determinants of Health (SDOH) Interventions SDOH Screenings   Alcohol Screen: Low Risk  (12/25/2018)  Tobacco Use: Low Risk  (12/26/2022)     Readmission Risk Interventions    12/27/2022    8:27 AM  Readmission Risk Prevention Plan  Transportation Screening Complete  HRI or Home Care Consult Complete  Social Work Consult for Recovery Care Planning/Counseling Complete  Palliative Care Screening Not Applicable  Medication Review Oceanographer) Complete

## 2022-12-30 NOTE — Progress Notes (Signed)
Patient is ambulatory, but confused. Charge nurse states patient cannot go [facility] transportation if confused due to safety. Sister is at bedside and this nurse offered for patient to be taken to [place] by her. Sister refused. Patient and sister are aware that if ems does not get to the hospital to transport patient by 6 this evening ems will have to be canceled due to [place] stating that patient has to be at their facility by 7pm. Patient will then stay the night here to have dialysis tomorrow before d/c.

## 2022-12-30 NOTE — Progress Notes (Signed)
Report was given to nurse Cala Bradford at [facility].

## 2022-12-30 NOTE — Discharge Summary (Signed)
Physician Discharge Summary  Madison HornDorothy R Adamczak ZOX:[id number] DOB: 05/10/1951 DOA: 12/26/2022  PCP: System, Provider Not In  Admit date: 12/26/2022  Discharge date: 12/30/2022  Admitted From:SNF  Disposition:  SNF  Recommendations for Outpatient Follow-up:  Follow up with PCP in 1-2 weeks Follow-up with cardiology as scheduled 4/30 at 3 PM Continue on midodrine twice daily as prescribed Continue on Imodium and Zofran as prescribed as needed for symptomatic diarrhea as well as N/V respectively in the setting of rotavirus gastroenteritis Continue hemodialysis as routinely scheduled Continue medications as noted below  Home Health: None  Equipment/Devices: None  Discharge Condition:Stable  CODE STATUS: Full  Diet recommendation: Heart Healthy  Brief/Interim Summary:  Madison [name] is a 72 y.o. female with medical history significant for  ESRD, anemia, schizoaffective disorder.  Patient was sent to the ED from dialysis via EMS with reports of nausea vomiting and low blood pressure.  She received 2 hours of her planned 3hr 45 minutes session.  Blood pressure was 70/40.  Heart rate 98.  She was admitted with SIRS criteria and no active signs of infection currently noted.  She was empirically started on IV antibiotics as noted below and given 1 L IV fluid bolus.  Antibiotics were eventually discontinued as it was discovered that she had rotavirus gastroenteritis and required symptomatic management.  Her blood pressure and heart rates improved over time and she was started on midodrine twice daily.  She was seen by cardiology due to new onset second-degree Mobitz type I AV block with recommendations to remain off of AV nodal blockade agents and follow-up will be arranged outpatient.  2D echocardiogram was also performed without any significant findings noted.  She is now in stable condition for discharge and tolerating diet.  Discharge Diagnoses:  Principal Problem:   SIRS (systemic inflammatory  response syndrome) Active Problems:   Bipolar affective disorder, current episode manic with psychotic symptoms   ESRD on hemodialysis  Principal discharge diagnosis: Rotavirus gastroenteritis and new onset second-degree Mobitz type I AV block with hypotension.  Discharge Instructions  Discharge Instructions     Diet - low sodium heart healthy   Complete by: As directed    Increase activity slowly   Complete by: As directed       Allergies as of 12/30/2022   No Known Allergies      Medication List     STOP taking these medications    ALPRAZolam 0.25 MG tablet Commonly known as: XANAX   benztropine 1 MG tablet Commonly known as: COGENTIN   docusate sodium 100 MG capsule Commonly known as: COLACE   DULoxetine 20 MG capsule Commonly known as: CYMBALTA   epoetin alfa [id number] UNIT/ML injection Commonly known as: EPOGEN   ferric citrate 1 GM 210 MG(Fe) tablet Commonly known as: AURYXIA   gabapentin 100 MG capsule Commonly known as: NEURONTIN   Lokelma 10 g Pack packet Generic drug: sodium zirconium cyclosilicate   LORazepam 0.5 MG tablet Commonly known as: ATIVAN   OLANZapine 5 MG tablet Commonly known as: ZyPREXA   risperiDONE 1 MG/ML oral solution Commonly known as: RISPERDAL   senna 8.6 MG Tabs tablet Commonly known as: SENOKOT       TAKE these medications    Abilify Maintena 300 MG Prsy prefilled syringe Generic drug: ARIPiprazole ER Inject 300 mg into the muscle every 30 (thirty) days. What changed: Another medication with the same name was removed. Continue taking this medication, and follow the directions you see here.  acetaminophen 325 MG tablet Commonly known as: TYLENOL Take 650 mg by mouth 3 (three) times daily.   aspirin EC 81 MG tablet Take 81 mg by mouth daily. Swallow whole.   DIALYVITE TABLET Tabs Take 1 tablet by mouth daily.   loperamide 2 MG capsule Commonly known as: IMODIUM Take 1 capsule (2 mg total) by mouth as  needed for diarrhea or loose stools. What changed: how much to take   Meloxicam 5 MG Caps Take 5 mg by mouth daily.   midodrine 5 MG tablet Commonly known as: PROAMATINE Take 3 tablets (15 mg total) by mouth 2 (two) times daily. What changed:  medication strength when to take this   ondansetron 4 MG tablet Commonly known as: Zofran Take 1 tablet (4 mg total) by mouth daily as needed for nausea or vomiting.   pantoprazole 40 MG tablet Commonly known as: PROTONIX TAKE 1 TABLET BY MOUTH ONCE DAILY.   thiamine 100 MG tablet Commonly known as: Vitamin B-1 Take 0.5 tablets (50 mg total) by mouth daily.   valproic acid 250 MG/5ML solution Commonly known as: DEPAKENE Take 250 mg by mouth 3 (three) times daily with meals.        Contact information for follow-up providers     Mallipeddi, Orion Modest, MD Follow up on 01/22/2023.   Specialties: Cardiology, Internal Medicine Why: Keep scheduled Cardiology follow-up for 01/22/2023 at 3:00 PM. Contact information: [address] [place] [address] [phone]              Contact information for after-discharge care     Destination     [facility] .   Service: Skilled Nursing Contact information: [address] [place] [address] [phone]                    No Known Allergies  Consultations: Nephrology Cardiology   Procedures/Studies: ECHOCARDIOGRAM LIMITED  Result Date: 12/27/2022    ECHOCARDIOGRAM LIMITED REPORT   Patient Name:   Madison [name] [facility] Date of Exam: 12/27/2022 Medical Rec #:  [id number]       Height:       64.0 in Accession #:    [id number]      Weight:       113.1 lb Date of Birth:  [birth year]        BSA:          1.535 m Patient Age:    71 years        BP:           109/42 mmHg Patient Gender: F               HR:           67 bpm. [facility] Procedure: Limited Echo Indications:    Ventricular Tachycardia I47.2  History:         Patient has prior history of Echocardiogram examinations, most                 recent 10/04/2022. Previous Myocardial Infarction and CAD. ESRD                 on hemodialysis, Schizophrenia (HCC) (From Hx).  Sonographer:    Celesta Gentile RCS Referring Phys: [id number] Maribel Luis D [facility] IMPRESSIONS  1. Limited Echo for LVEF assessment.  2. Left ventricular ejection fraction, by estimation, is 60 to 65%. The left ventricle has normal function. The left ventricle has no regional wall  motion abnormalities. Left ventricular diastolic function not assessed.  3. Right ventricular systolic function is normal. The right ventricular size is normal. Tricuspid regurgitation signal is inadequate for assessing PA pressure.  4. The inferior vena cava is normal in size with greater than 50% respiratory variability, suggesting right atrial pressure of 3 mmHg. Comparison(s): No significant change from prior study. FINDINGS  Left Ventricle: Left ventricular ejection fraction, by estimation, is 60 to 65%. The left ventricle has normal function. The left ventricle has no regional wall motion abnormalities. The left ventricular internal cavity size was normal in size. There is  no left ventricular hypertrophy. Left ventricular diastolic function could not be evaluated. Right Ventricle: The right ventricular size is normal. Right vetricular wall thickness was not assessed. Right ventricular systolic function is normal. Tricuspid regurgitation signal is inadequate for assessing PA pressure. Left Atrium: Left atrial size was not assessed. Right Atrium: Right atrial size was not assessed. Pericardium: There is no evidence of pericardial effusion. Mitral Valve: The mitral valve is grossly normal. Tricuspid Valve: The tricuspid valve is not well visualized. Aortic Valve: The aortic valve was not well visualized. Pulmonic Valve: The pulmonic valve was not well visualized. Aorta: Aortic root could not be assessed. Venous: The inferior vena cava is  normal in size with greater than 50% respiratory variability, suggesting right atrial pressure of 3 mmHg. IAS/Shunts: No atrial level shunt detected by color flow Doppler. LEFT VENTRICLE PLAX 2D LVIDd:         4.00 cm LVIDs:         2.50 cm LV PW:         1.10 cm LV IVS:        0.90 cm LVOT diam:     2.00 cm LVOT Area:     3.14 cm  RIGHT VENTRICLE TAPSE (M-mode): 2.4 cm LEFT ATRIUM         Index LA diam:    2.80 cm 1.82 cm/m   AORTA Ao Root diam: 2.90 cm  SHUNTS Systemic Diam: 2.00 cm Vishnu Priya Mallipeddi Electronically signed by Winfield Rast Mallipeddi Signature Date/Time: 12/27/2022/12:56:50 PM    Final    CT Abdomen Pelvis W Contrast  Result Date: 12/26/2022 CLINICAL DATA:  Abdominal pain, nausea and vomiting EXAM: CT ABDOMEN AND PELVIS WITH CONTRAST TECHNIQUE: Multidetector CT imaging of the abdomen and pelvis was performed using the standard protocol following bolus administration of intravenous contrast. RADIATION DOSE REDUCTION: This exam was performed according to the departmental dose-optimization program which includes automated exposure control, adjustment of the mA and/or kV according to patient size and/or use of iterative reconstruction technique. CONTRAST:  75mL OMNIPAQUE IOHEXOL 300 MG/ML  SOLN COMPARISON:  12/26/2022 FINDINGS: Lower chest: No acute pleural or parenchymal lung disease. Hepatobiliary: Cholecystectomy. There is mild intrahepatic biliary duct dilation, which is nonspecific. The common bile duct is normal in caliber measuring 6 mm. No evidence of choledocholithiasis. The liver is otherwise unremarkable. Pancreas: Unremarkable. No pancreatic ductal dilatation or surrounding inflammatory changes. Spleen: Normal in size without focal abnormality. Adrenals/Urinary Tract: Bilateral renal cortical atrophy, right greater than left, consistent with end-stage renal disease. There are multiple bilateral renal hypodensities, with characteristics suggesting cysts. No specific imaging follow-up  is recommended. No urinary tract calculi. The adrenals are normal. Bladder is decompressed, which limits its evaluation. Stomach/Bowel: No bowel obstruction or ileus. Administered oral contrast has progressed throughout the colon to the level of the rectum. Postsurgical changes from partial right hemicolectomy and reanastomosis. No evidence of bowel wall  thickening or inflammatory change. Diverticulosis of the distal colon without evidence of diverticulitis. Vascular/Lymphatic: Aortic atherosclerosis. No enlarged abdominal or pelvic lymph nodes. Reproductive: Uterus and bilateral adnexa are unremarkable. Other: No free fluid or free intraperitoneal gas. No abdominal wall hernia. Musculoskeletal: There are no acute or destructive bony lesions. Severe multilevel lumbar spondylosis. Reconstructed images demonstrate no additional findings. IMPRESSION: 1. No evidence of bowel obstruction or ileus. 2. Distal colonic diverticulosis without diverticulitis. 3. Nonspecific mild intrahepatic biliary duct dilation in this patient status post cholecystectomy. The common bile duct is normal in caliber. 4.  Aortic Atherosclerosis (ICD10-I70.0). Electronically Signed   By: Sharlet Salina M.D.   On: 12/26/2022 19:31   DG ABD ACUTE 2+V W 1V CHEST  Result Date: 12/26/2022 CLINICAL DATA:  Vomiting EXAM: DG ABDOMEN ACUTE WITH 1 VIEW CHEST COMPARISON:  04/05/2020 FINDINGS: There is no evidence of dilated bowel loops or free intraperitoneal air. Cholecystectomy clips. No radiopaque calculi or other significant radiographic abnormality is seen. Heart size and mediastinal contours are within normal limits. Both lungs are clear. Lumbar levocurvature. IMPRESSION: 1. Nonobstructive bowel gas pattern. 2. No acute cardiopulmonary disease. Electronically Signed   By: Duanne Guess D.O.   On: 12/26/2022 12:40     Discharge Exam: Vitals:   12/30/22 0346 12/30/22 0857  BP: 121/69 98/61  Pulse: 64 79  Resp: 20 18  Temp: 98.3 F (36.8  C)   SpO2: 100% 100%   Vitals:   12/29/22 1837 12/29/22 2127 12/30/22 0346 12/30/22 0857  BP: 107/68 121/63 121/69 98/61  Pulse: 65 66 64 79  Resp: 17 19 20 18   Temp: 98.4 F (36.9 C) 98.4 F (36.9 C) 98.3 F (36.8 C)   TempSrc: Oral Oral Oral   SpO2: 100% 100% 100% 100%  Weight:   50.8 kg   Height:        General: Pt is alert, awake, not in acute distress Cardiovascular: RRR, S1/S2 +, no rubs, no gallops Respiratory: CTA bilaterally, no wheezing, no rhonchi Abdominal: Soft, NT, ND, bowel sounds + Extremities: no edema, no cyanosis    The results of significant diagnostics from this hospitalization (including imaging, microbiology, ancillary and laboratory) are listed below for reference.     Microbiology: Recent Results (from the past 240 hour(s))  Blood culture (routine x 2)     Status: None (Preliminary result)   Collection Time: 12/26/22  3:25 PM   Specimen: BLOOD RIGHT ARM  Result Value Ref Range Status   Specimen Description BLOOD RIGHT ARM  Final   Special Requests   Final    BOTTLES DRAWN AEROBIC AND ANAEROBIC Blood Culture adequate volume   Culture   Final    NO GROWTH 4 DAYS Performed at [facility], [address] [place], [place] [address]    Report Status PENDING  Incomplete  Blood culture (routine x 2)     Status: None (Preliminary result)   Collection Time: 12/26/22  3:50 PM   Specimen: BLOOD RIGHT ARM  Result Value Ref Range Status   Specimen Description BLOOD RIGHT ARM  Final   Special Requests   Final    BOTTLES DRAWN AEROBIC AND ANAEROBIC Blood Culture adequate volume   Culture   Final    NO GROWTH 4 DAYS Performed at [facility], [address], [place], [place] [address]    Report Status PENDING  Incomplete  Gastrointestinal Panel by PCR , Stool     Status: Abnormal   Collection Time: 12/26/22  9:14 PM  Specimen: Stool  Result Value Ref Range Status   Campylobacter species NOT DETECTED NOT DETECTED Final   Plesimonas shigelloides NOT  DETECTED NOT DETECTED Final   Salmonella species NOT DETECTED NOT DETECTED Final   Yersinia enterocolitica NOT DETECTED NOT DETECTED Final   Vibrio species NOT DETECTED NOT DETECTED Final   Vibrio cholerae NOT DETECTED NOT DETECTED Final   Enteroaggregative E coli (EAEC) NOT DETECTED NOT DETECTED Final   Enteropathogenic E coli (EPEC) NOT DETECTED NOT DETECTED Final   Enterotoxigenic E coli (ETEC) NOT DETECTED NOT DETECTED Final   Shiga like toxin producing E coli (STEC) NOT DETECTED NOT DETECTED Final   Shigella/Enteroinvasive E coli (EIEC) NOT DETECTED NOT DETECTED Final   Cryptosporidium NOT DETECTED NOT DETECTED Final   Cyclospora cayetanensis NOT DETECTED NOT DETECTED Final   Entamoeba histolytica NOT DETECTED NOT DETECTED Final   Giardia lamblia NOT DETECTED NOT DETECTED Final   Adenovirus F40/41 NOT DETECTED NOT DETECTED Final   Astrovirus NOT DETECTED NOT DETECTED Final   Norovirus GI/GII NOT DETECTED NOT DETECTED Final   Rotavirus A DETECTED (A) NOT DETECTED Final   Sapovirus (I, II, IV, and V) NOT DETECTED NOT DETECTED Final    Comment: Performed at [facility], [address] [place], [place] [address]  C Difficile Quick Screen w PCR reflex     Status: None   Collection Time: 12/26/22  9:14 PM   Specimen: Stool  Result Value Ref Range Status   C Diff antigen NEGATIVE NEGATIVE Final   C Diff toxin NEGATIVE NEGATIVE Final   C Diff interpretation No C. difficile detected.  Final    Comment: Performed at [facility], [address] [place], [place] [address]  MRSA Next Gen by PCR, Nasal     Status: Abnormal   Collection Time: 12/26/22 10:40 PM   Specimen: Nasal Mucosa; Nasal Swab  Result Value Ref Range Status   MRSA by PCR Next Gen DETECTED (A) NOT DETECTED Final    Comment: RESULT CALLED TO, READ BACK BY AND VERIFIED WITH: S.BHATTARAI AT 0305 ON 04.04.24 BY ADGER J         The GeneXpert MRSA Assay (FDA approved for NASAL specimens only), is one component of  a comprehensive MRSA colonization surveillance program. It is not intended to diagnose MRSA infection nor to guide or monitor treatment for MRSA infections. Performed at [facility], [address], [place], [place] [address]      Labs: BNP (last 3 results) No results for input(s): "BNP" in the last 8760 hours. Basic Metabolic Panel: Recent Labs  Lab 12/26/22 1153 12/27/22 0512 12/29/22 0416 12/30/22 0500  NA 138 131* 131* 131*  K 3.7 3.8 3.5 3.8  CL 92* 94* 95* 95*  CO2 GLUCOSE 102* 88 83 73  BUN 41* 52* 25* 33*  CREATININE 6.03* 7.25* 5.21* 7.45*  CALCIUM 10.1 9.1 9.0 7.9*  MG  --  1.8 1.6* 2.2  PHOS  --   --  5.1*  --    Liver Function Tests: Recent Labs  Lab 12/26/22 1153 12/29/22 0416  AST 19  --   ALT 14  --   ALKPHOS 81  --   BILITOT 0.7  --   PROT 8.2*  --   ALBUMIN 3.9 3.0*   No results for input(s): "LIPASE", "AMYLASE" in the last 168 hours. No results for input(s): "AMMONIA" in the last 168 hours. CBC: Recent Labs  Lab 12/26/22 1153 12/27/22 0512 12/29/22  0416 12/30/22 0500  WBC 9.9 5.1 4.2 3.7*  HGB 14.1 11.3* 12.9 13.1  HCT 43.5 34.9* 39.0 43.0  MCV 100.0 100.3* 99.2 104.4*  PLT 225 201 PLATELETS APPEAR ADEQUATE 189   Cardiac Enzymes: No results for input(s): "CKTOTAL", "CKMB", "CKMBINDEX", "TROPONINI" in the last 168 hours. BNP: Invalid input(s): "POCBNP" CBG: No results for input(s): "GLUCAP" in the last 168 hours. D-Dimer No results for input(s): "DDIMER" in the last 72 hours. Hgb A1c No results for input(s): "HGBA1C" in the last 72 hours. Lipid Profile No results for input(s): "CHOL", "HDL", "LDLCALC", "TRIG", "CHOLHDL", "LDLDIRECT" in the last 72 hours. Thyroid function studies No results for input(s): "TSH", "T4TOTAL", "T3FREE", "THYROIDAB" in the last 72 hours.  Invalid input(s): "FREET3" Anemia work up No results for input(s): "VITAMINB12", "FOLATE", "FERRITIN", "TIBC", "IRON", "RETICCTPCT" in the last 72  hours. Urinalysis    Component Value Date/Time   COLORURINE YELLOW (A) 08/15/2017 1155   APPEARANCEUR HAZY (A) 08/15/2017 1155   LABSPEC 1.007 08/15/2017 1155   PHURINE 8.0 08/15/2017 1155   GLUCOSEU NEGATIVE 08/15/2017 1155   HGBUR NEGATIVE 08/15/2017 1155   BILIRUBINUR NEGATIVE 08/15/2017 1155   KETONESUR NEGATIVE 08/15/2017 1155   PROTEINUR >=300 (A) 08/15/2017 1155   NITRITE NEGATIVE 08/15/2017 1155   LEUKOCYTESUR SMALL (A) 08/15/2017 1155   Sepsis Labs Recent Labs  Lab 12/26/22 1153 12/27/22 0512 12/29/22 0416 12/30/22 0500  WBC 9.9 5.1 4.2 3.7*   Microbiology Recent Results (from the past 240 hour(s))  Blood culture (routine x 2)     Status: None (Preliminary result)   Collection Time: 12/26/22  3:25 PM   Specimen: BLOOD RIGHT ARM  Result Value Ref Range Status   Specimen Description BLOOD RIGHT ARM  Final   Special Requests   Final    BOTTLES DRAWN AEROBIC AND ANAEROBIC Blood Culture adequate volume   Culture   Final    NO GROWTH 4 DAYS Performed at [facility], [address], [place], [place] [address]    Report Status PENDING  Incomplete  Blood culture (routine x 2)     Status: None (Preliminary result)   Collection Time: 12/26/22  3:50 PM   Specimen: BLOOD RIGHT ARM  Result Value Ref Range Status   Specimen Description BLOOD RIGHT ARM  Final   Special Requests   Final    BOTTLES DRAWN AEROBIC AND ANAEROBIC Blood Culture adequate volume   Culture   Final    NO GROWTH 4 DAYS Performed at [facility], [address], [place], [place] [address]    Report Status PENDING  Incomplete  Gastrointestinal Panel by PCR , Stool     Status: Abnormal   Collection Time: 12/26/22  9:14 PM   Specimen: Stool  Result Value Ref Range Status   Campylobacter species NOT DETECTED NOT DETECTED Final   Plesimonas shigelloides NOT DETECTED NOT DETECTED Final   Salmonella species NOT DETECTED NOT DETECTED Final   Yersinia enterocolitica NOT DETECTED NOT DETECTED Final    Vibrio species NOT DETECTED NOT DETECTED Final   Vibrio cholerae NOT DETECTED NOT DETECTED Final   Enteroaggregative E coli (EAEC) NOT DETECTED NOT DETECTED Final   Enteropathogenic E coli (EPEC) NOT DETECTED NOT DETECTED Final   Enterotoxigenic E coli (ETEC) NOT DETECTED NOT DETECTED Final   Shiga like toxin producing E coli (STEC) NOT DETECTED NOT DETECTED Final   Shigella/Enteroinvasive E coli (EIEC) NOT DETECTED NOT DETECTED Final   Cryptosporidium NOT DETECTED NOT DETECTED Final   Cyclospora cayetanensis NOT  DETECTED NOT DETECTED Final   Entamoeba histolytica NOT DETECTED NOT DETECTED Final   Giardia lamblia NOT DETECTED NOT DETECTED Final   Adenovirus F40/41 NOT DETECTED NOT DETECTED Final   Astrovirus NOT DETECTED NOT DETECTED Final   Norovirus GI/GII NOT DETECTED NOT DETECTED Final   Rotavirus A DETECTED (A) NOT DETECTED Final   Sapovirus (I, II, IV, and V) NOT DETECTED NOT DETECTED Final    Comment: Performed at [facility], [address], [place], [place] [address]  C Difficile Quick Screen w PCR reflex     Status: None   Collection Time: 12/26/22  9:14 PM   Specimen: Stool  Result Value Ref Range Status   C Diff antigen NEGATIVE NEGATIVE Final   C Diff toxin NEGATIVE NEGATIVE Final   C Diff interpretation No C. difficile detected.  Final    Comment: Performed at [facility], [address] [place], [place] [address]  MRSA Next Gen by PCR, Nasal     Status: Abnormal   Collection Time: 12/26/22 10:40 PM   Specimen: Nasal Mucosa; Nasal Swab  Result Value Ref Range Status   MRSA by PCR Next Gen DETECTED (A) NOT DETECTED Final    Comment: RESULT CALLED TO, READ BACK BY AND VERIFIED WITH: S.BHATTARAI AT 0305 ON 04.04.24 BY ADGER J         The GeneXpert MRSA Assay (FDA approved for NASAL specimens only), is one component of a comprehensive MRSA colonization surveillance program. It is not intended to diagnose MRSA infection nor to guide or monitor treatment  for MRSA infections. Performed at [facility], [address] [place], [place] [address]      Time coordinating discharge: 35 minutes  SIGNED:   Erick Blinks, DO [facility] 12/30/2022, 9:27 AM  If 7PM-7AM, please contact night-coverage [email or web address]

## 2022-12-30 NOTE — Progress Notes (Signed)
Tele called stating patient's hr was in the 40s and 50s. Patient was sleeping when this nurse went to assess. Patient's vitals were 79 hr, 98/61 bp, 18 rr, and 100 os on ra. MD shah notified.

## 2022-12-31 LAB — CULTURE, BLOOD (ROUTINE X 2)
Culture: NO GROWTH
Culture: NO GROWTH
Special Requests: ADEQUATE
Special Requests: ADEQUATE

## 2022-12-31 MED ORDER — HEPARIN SODIUM (PORCINE) 1000 UNIT/ML DIALYSIS
20.0000 [IU]/kg | INTRAMUSCULAR | Status: DC | PRN
  Administered 2022-12-31 (×2): 1000 [IU] via INTRAVENOUS_CENTRAL

## 2022-12-31 MED ORDER — CHLORHEXIDINE GLUCONATE CLOTH 2 % EX PADS: 6.0000 | MEDICATED_PAD | Freq: Every day | CUTANEOUS | Status: DC

## 2022-12-31 MED ORDER — DOXERCALCIFEROL 4 MCG/2ML IV SOLN
INTRAVENOUS | Status: AC
  Filled 2022-12-31: qty 4

## 2022-12-31 MED ORDER — HEPARIN SODIUM (PORCINE) 1000 UNIT/ML IJ SOLN
INTRAMUSCULAR | Status: AC
  Filled 2022-12-31: qty 1

## 2022-12-31 NOTE — Progress Notes (Signed)
Patient has been confused this shift, alert and oriented to self only. Patient did not sleep much throughout the night. Denies pain.

## 2022-12-31 NOTE — Procedures (Signed)
Hemodialysis Note:  Tolerated treatment. Removed 1.3L. Required new setup due to clotted dialyzer (see previous HD note). Per Tablo, completed 3 hours 17 minutes of 3.5 hour treatment. With 13 minutes remaining, Tablo paused clock and VP started increasing. RB due to potential clotted of second dialyzer. Nephrologist aware. Upon rinseback, dialyzer heavily streaked. Hemostasis achieved at 15 minutes for arterial and venous needle sites. This RN transported patient back to floor and gave report to primary nurse.

## 2022-12-31 NOTE — Procedures (Signed)
Hemodialysis Note:  Delay in treatment start due to Tablo requiring new system setup in [facility], and patient requiring ADL care upon this RN's arrival to floor to transport patient. Treatment initiated at 1245.

## 2022-12-31 NOTE — TOC Transition Note (Signed)
Transition of Care [facility]) - CM/SW Discharge Note   Patient Details  Name: Madison [name] MRN: [id number] Date of Birth: 15-May-1951  Transition of Care [facility]) CM/SW Contact:  Karn Cassis, LCSW Phone Number: 12/31/2022, 3:45 PM   Clinical Narrative: Pt will d/c today after dialysis to [facility]. D/C summary sent to SNF. Pt's sister, Madison [name] and facility aware and agreeable to d/c. RN given number to call report. Discussed transport and due to confusion, RN feels pt will need [facility]. Floor to call when pt is ready after dialysis. Medical necessity printed to floor.    Final next level of care: Skilled Nursing Facility Barriers to Discharge: Barriers Resolved   Patient Goals and CMS Choice   Choice offered to / list presented to : Sibling  Discharge Placement                Patient chooses bed at: Other - please specify in the comment section below: [facility]) Patient to be transferred to facility by: EMS/[place] Name of family member notified: Catheryne Melcher Patient and family notified of of transfer: 12/31/22  Discharge Plan and Services Additional resources added to the After Visit Summary for   In-house Referral: Clinical Social Work                                   Social Determinants of Health (SDOH) Interventions SDOH Screenings   Alcohol Screen: Low Risk  (12/25/2018)  Tobacco Use: Low Risk  (12/26/2022)     Readmission Risk Interventions    12/27/2022    8:27 AM  Readmission Risk Prevention Plan  Transportation Screening Complete  HRI or Home Care Consult Complete  Social Work Consult for Recovery Care Planning/Counseling Complete  Palliative Care Screening Not Applicable  Medication Review Ocean[facility]) Complete

## 2022-12-31 NOTE — Progress Notes (Signed)
Subjective:  in bed-  trying to get her wig on-  needing much assistance setting up breakfast -  discharge summary in chart-  plan is for her to go later today but needs HD first   Objective Vital signs in last 24 hours: Vitals:   12/30/22 0857 12/30/22 1313 12/30/22 2059 12/31/22 0620  BP: 98/61 105/62 (!) 105/55 (!) 123/57  Pulse: 79 66 (!) 56 60  Resp: 18 19 18 20   Temp:  98.5 F (36.9 C) 97.6 F (36.4 C) (!) 97.5 F (36.4 C)  TempSrc:  Oral  Oral  SpO2: 100% 100% 100% 100%  Weight:      Height:       Weight change:   Intake/Output Summary (Last 24 hours) at 12/31/2022 0831 Last data filed at 12/30/2022 1853 Gross per 24 hour  Intake 480 ml  Output --  Net 480 ml    dialysis at [facility] in [place], MWF, 225 minutes, 2K /2.5 calcium, left upper extremity AV fistula for the access-  15 gauge- 500 BFR ? , estimated dry weight was 50.5 kg-  has been under. Hep  4000 bolus hect 7, 150 sensipar  Assessment/ Plan: Pt is a 72 y.o. yo female with ESRD who was admitted on 12/26/2022 with N/V and low BP req transient pressors    Assessment/Plan: 1. N/V/low BP -  being treated like sepsis of a GI source- given maxepime/vanc and flagyl-  transient pressors but now off  is off abx as well  2. ESRD - normally MWF at [facility]-  planning for HD today on schedule via AVF-  does not appear to need much volume removal  3. Anemia- hgb still at goal-  no ESA needed at this time 4. Secondary hyperparathyroidism-  will continue home hectorol and sensipar -- also has [place] on med list-  will resume if phos is high - was not so did not 5. HTN/volume- does not appear too overloaded even though weight is above EDW-  -  is noted to be on midodrine as OP -  has been added here  6. Dispo- plan is for discharge to SNF today    Madison [name]    Labs: Basic Metabolic Panel: Recent Labs  Lab 12/27/22 0512 12/29/22 0416 12/30/22 0500  NA 131* 131* 131*  K 3.8 3.5 3.8  CL 94* 95* 95*   CO2 22 24 22   GLUCOSE 88 83 73  BUN 52* 25* 33*  CREATININE 7.25* 5.21* 7.45*  CALCIUM 9.1 9.0 7.9*  PHOS  --  5.1*  --    Liver Function Tests: Recent Labs  Lab 12/26/22 1153 12/29/22 0416  AST 19  --   ALT 14  --   ALKPHOS 81  --   BILITOT 0.7  --   PROT 8.2*  --   ALBUMIN 3.9 3.0*   No results for input(s): "LIPASE", "AMYLASE" in the last 168 hours. No results for input(s): "AMMONIA" in the last 168 hours. CBC: Recent Labs  Lab 12/26/22 1153 12/27/22 0512 12/29/22 0416 12/30/22 0500  WBC 9.9 5.1 4.2 3.7*  HGB 14.1 11.3* 12.9 13.1  HCT 43.5 34.9* 39.0 43.0  MCV 100.0 100.3* 99.2 104.4*  PLT 225 201 PLATELETS APPEAR ADEQUATE 189   Cardiac Enzymes: No results for input(s): "CKTOTAL", "CKMB", "CKMBINDEX", "TROPONINI" in the last 168 hours. CBG: No results for input(s): "GLUCAP" in the last 168 hours.  Iron Studies: No results for input(s): "IRON", "TIBC", "TRANSFERRIN", "FERRITIN" in the last 72  hours. Studies/Results: No results found. Medications: Infusions:  sodium chloride Stopped (12/28/22 1306)    Scheduled Medications:  Chlorhexidine Gluconate Cloth  6 each Topical Q0600   Chlorhexidine Gluconate Cloth  6 each Topical Q0600   cinacalcet  150 mg Oral Q M,W,F-HD   doxercalciferol  7 mcg Intravenous Q M,W,F-HD   heparin  5,000 Units Subcutaneous Q8H   midodrine  15 mg Oral BID   mupirocin ointment  1 Application Nasal BID   pantoprazole  40 mg Oral Daily   valproic acid  250 mg Oral TID WC    have reviewed scheduled and prn medications.  Physical Exam: General: NAD- confused-  according to my conversation with her OP HD unit this is her baseline -  she is oriented to person only -  occasionally place Heart: RRR Lungs: mostly clear Abdomen: soft, non tender Extremities: no peripheral edema  Dialysis Access: left upper arm AVF-  good thrill and bruit     12/31/2022,8:31 AM  LOS: 5 days

## 2022-12-31 NOTE — Progress Notes (Signed)
Isolation/contact precautions initiated due to enteric precautions. KDU 1:1.

## 2022-12-31 NOTE — Progress Notes (Signed)
Mobility Specialist Progress Note:   12/31/22 1200  Mobility  Activity Ambulated with assistance in room  Level of Assistance Contact guard assist, steadying assist  Assistive Device Front wheel walker  Distance Ambulated (ft) 25 ft  Activity Response Tolerated well  Mobility Referral Yes  $Mobility charge 1 Mobility   Pt agreeable to mobility session. Ambulated in room with RW, CGA required for safety.Tolerated well. Pt incontinent of stool, assisted to bathroom. Nurse arrived to room to assist with peri care. Left pt in care of nurses for dialysis, all needs met.    Feliciana Rossetti Mobility Specialist Please contact via Special educational needs teacher or  Rehab office at [phone]

## 2022-12-31 NOTE — Progress Notes (Signed)
Patient seen and evaluated this a.m. with no new concerns or events noted overnight.  She is tolerating diet without further nausea or vomiting or diarrhea.  She continues to remain confused, but this is at her baseline.  Please refer to discharge summary dictated 4/7 for full details.  She will receive hemodialysis here today prior to discharge.  Total care time: 20 minutes.

## 2022-12-31 NOTE — Progress Notes (Signed)
Report given to [place] at [facility]. Patient to be transported there via EMS.

## 2022-12-31 NOTE — Procedures (Signed)
Hemodialysis Note:  Treatment paused and new catridge/lines set up due to clotted dialyzer; VP out of acceptable range with BFR decreased to 200. Was able to RB. Treatment restarted. Nephrologist notified and order obtained for additional heparin bolus 1000 units.

## 2023-01-01 LAB — HEPATITIS B SURFACE ANTIBODY, QUANTITATIVE: Hep B S AB Quant (Post): 17 m[IU]/mL (ref >9.9)

## 2023-01-09 ENCOUNTER — Ambulatory Visit: Payer: Medicare Other | Admitting: Podiatry

## 2023-01-11 ENCOUNTER — Ambulatory Visit: Payer: Medicare Other | Admitting: Podiatry

## 2023-01-15 ENCOUNTER — Ambulatory Visit: Payer: Medicare Other | Admitting: Podiatry

## 2023-01-22 ENCOUNTER — Ambulatory Visit: Payer: Medicare Other | Admitting: Internal Medicine

## 2023-02-21 ENCOUNTER — Ambulatory Visit: Payer: Medicare Other | Admitting: Podiatry

## 2023-02-25 ENCOUNTER — Encounter (HOSPITAL_COMMUNITY): Payer: Self-pay

## 2023-02-25 ENCOUNTER — Telehealth (HOSPITAL_BASED_OUTPATIENT_CLINIC_OR_DEPARTMENT_OTHER): Payer: Self-pay | Admitting: *Deleted

## 2023-02-26 ENCOUNTER — Inpatient Hospital Stay (HOSPITAL_COMMUNITY): Payer: Medicare Other

## 2023-02-26 ENCOUNTER — Encounter (HOSPITAL_COMMUNITY): Payer: Self-pay | Admitting: Internal Medicine

## 2023-02-26 ENCOUNTER — Inpatient Hospital Stay (HOSPITAL_COMMUNITY)
Admission: AD | Admit: 2023-02-26 | Discharge: 2023-04-25 | DRG: 981 | Disposition: E | Payer: Medicare Other | Source: Other Acute Inpatient Hospital | Attending: Internal Medicine | Admitting: Internal Medicine

## 2023-02-26 DIAGNOSIS — F259 Schizoaffective disorder, unspecified: Secondary | ICD-10-CM | POA: Diagnosis not present

## 2023-02-26 DIAGNOSIS — J189 Pneumonia, unspecified organism: Secondary | ICD-10-CM | POA: Diagnosis not present

## 2023-02-26 DIAGNOSIS — K6289 Other specified diseases of anus and rectum: Secondary | ICD-10-CM | POA: Diagnosis not present

## 2023-02-26 DIAGNOSIS — S72009A Fracture of unspecified part of neck of unspecified femur, initial encounter for closed fracture: Secondary | ICD-10-CM | POA: Insufficient documentation

## 2023-02-26 DIAGNOSIS — K5289 Other specified noninfective gastroenteritis and colitis: Secondary | ICD-10-CM | POA: Diagnosis present

## 2023-02-26 DIAGNOSIS — Z515 Encounter for palliative care: Secondary | ICD-10-CM

## 2023-02-26 DIAGNOSIS — J9690 Respiratory failure, unspecified, unspecified whether with hypoxia or hypercapnia: Secondary | ICD-10-CM | POA: Diagnosis not present

## 2023-02-26 DIAGNOSIS — E440 Moderate protein-calorie malnutrition: Secondary | ICD-10-CM | POA: Diagnosis present

## 2023-02-26 DIAGNOSIS — R34 Anuria and oliguria: Secondary | ICD-10-CM | POA: Diagnosis not present

## 2023-02-26 DIAGNOSIS — K219 Gastro-esophageal reflux disease without esophagitis: Secondary | ICD-10-CM | POA: Diagnosis not present

## 2023-02-26 DIAGNOSIS — D62 Acute posthemorrhagic anemia: Secondary | ICD-10-CM | POA: Diagnosis not present

## 2023-02-26 DIAGNOSIS — K5120 Ulcerative (chronic) proctitis without complications: Secondary | ICD-10-CM | POA: Diagnosis not present

## 2023-02-26 DIAGNOSIS — F250 Schizoaffective disorder, bipolar type: Secondary | ICD-10-CM | POA: Diagnosis not present

## 2023-02-26 DIAGNOSIS — Z7189 Other specified counseling: Secondary | ICD-10-CM | POA: Diagnosis not present

## 2023-02-26 DIAGNOSIS — R627 Adult failure to thrive: Secondary | ICD-10-CM | POA: Diagnosis present

## 2023-02-26 DIAGNOSIS — R4586 Emotional lability: Secondary | ICD-10-CM | POA: Diagnosis not present

## 2023-02-26 DIAGNOSIS — R451 Restlessness and agitation: Secondary | ICD-10-CM | POA: Diagnosis present

## 2023-02-26 DIAGNOSIS — Z841 Family history of disorders of kidney and ureter: Secondary | ICD-10-CM

## 2023-02-26 DIAGNOSIS — K5900 Constipation, unspecified: Secondary | ICD-10-CM

## 2023-02-26 DIAGNOSIS — D128 Benign neoplasm of rectum: Secondary | ICD-10-CM | POA: Diagnosis not present

## 2023-02-26 DIAGNOSIS — E871 Hypo-osmolality and hyponatremia: Secondary | ICD-10-CM | POA: Diagnosis present

## 2023-02-26 DIAGNOSIS — I871 Compression of vein: Secondary | ICD-10-CM | POA: Diagnosis not present

## 2023-02-26 DIAGNOSIS — F0390 Unspecified dementia without behavioral disturbance: Secondary | ICD-10-CM | POA: Diagnosis present

## 2023-02-26 DIAGNOSIS — A419 Sepsis, unspecified organism: Secondary | ICD-10-CM | POA: Diagnosis not present

## 2023-02-26 DIAGNOSIS — E8809 Other disorders of plasma-protein metabolism, not elsewhere classified: Secondary | ICD-10-CM | POA: Diagnosis present

## 2023-02-26 DIAGNOSIS — K625 Hemorrhage of anus and rectum: Secondary | ICD-10-CM | POA: Diagnosis not present

## 2023-02-26 DIAGNOSIS — R000 Tachycardia, unspecified: Secondary | ICD-10-CM | POA: Diagnosis not present

## 2023-02-26 DIAGNOSIS — I495 Sick sinus syndrome: Secondary | ICD-10-CM | POA: Diagnosis not present

## 2023-02-26 DIAGNOSIS — Z66 Do not resuscitate: Secondary | ICD-10-CM | POA: Diagnosis present

## 2023-02-26 DIAGNOSIS — E875 Hyperkalemia: Secondary | ICD-10-CM | POA: Diagnosis present

## 2023-02-26 DIAGNOSIS — F209 Schizophrenia, unspecified: Secondary | ICD-10-CM | POA: Diagnosis not present

## 2023-02-26 DIAGNOSIS — K626 Ulcer of anus and rectum: Secondary | ICD-10-CM | POA: Diagnosis present

## 2023-02-26 DIAGNOSIS — Z681 Body mass index (BMI) 19 or less, adult: Secondary | ICD-10-CM | POA: Diagnosis not present

## 2023-02-26 DIAGNOSIS — N186 End stage renal disease: Secondary | ICD-10-CM

## 2023-02-26 DIAGNOSIS — W1830XA Fall on same level, unspecified, initial encounter: Secondary | ICD-10-CM | POA: Diagnosis present

## 2023-02-26 DIAGNOSIS — Z9049 Acquired absence of other specified parts of digestive tract: Secondary | ICD-10-CM

## 2023-02-26 DIAGNOSIS — K621 Rectal polyp: Secondary | ICD-10-CM | POA: Diagnosis present

## 2023-02-26 DIAGNOSIS — E43 Unspecified severe protein-calorie malnutrition: Secondary | ICD-10-CM | POA: Diagnosis not present

## 2023-02-26 DIAGNOSIS — I120 Hypertensive chronic kidney disease with stage 5 chronic kidney disease or end stage renal disease: Principal | ICD-10-CM | POA: Diagnosis present

## 2023-02-26 DIAGNOSIS — K922 Gastrointestinal hemorrhage, unspecified: Secondary | ICD-10-CM | POA: Diagnosis not present

## 2023-02-26 DIAGNOSIS — I252 Old myocardial infarction: Secondary | ICD-10-CM | POA: Diagnosis not present

## 2023-02-26 DIAGNOSIS — I2510 Atherosclerotic heart disease of native coronary artery without angina pectoris: Secondary | ICD-10-CM | POA: Diagnosis present

## 2023-02-26 DIAGNOSIS — F419 Anxiety disorder, unspecified: Secondary | ICD-10-CM | POA: Diagnosis present

## 2023-02-26 DIAGNOSIS — S72002A Fracture of unspecified part of neck of left femur, initial encounter for closed fracture: Secondary | ICD-10-CM

## 2023-02-26 DIAGNOSIS — D72829 Elevated white blood cell count, unspecified: Secondary | ICD-10-CM | POA: Diagnosis not present

## 2023-02-26 DIAGNOSIS — J90 Pleural effusion, not elsewhere classified: Secondary | ICD-10-CM | POA: Diagnosis not present

## 2023-02-26 DIAGNOSIS — D631 Anemia in chronic kidney disease: Secondary | ICD-10-CM | POA: Diagnosis present

## 2023-02-26 DIAGNOSIS — Z992 Dependence on renal dialysis: Secondary | ICD-10-CM

## 2023-02-26 DIAGNOSIS — I953 Hypotension of hemodialysis: Secondary | ICD-10-CM | POA: Diagnosis not present

## 2023-02-26 DIAGNOSIS — S72002D Fracture of unspecified part of neck of left femur, subsequent encounter for closed fracture with routine healing: Secondary | ICD-10-CM | POA: Diagnosis not present

## 2023-02-26 DIAGNOSIS — F319 Bipolar disorder, unspecified: Secondary | ICD-10-CM | POA: Diagnosis not present

## 2023-02-26 DIAGNOSIS — D126 Benign neoplasm of colon, unspecified: Secondary | ICD-10-CM | POA: Diagnosis not present

## 2023-02-26 DIAGNOSIS — R64 Cachexia: Secondary | ICD-10-CM | POA: Diagnosis present

## 2023-02-26 DIAGNOSIS — I3139 Other pericardial effusion (noninflammatory): Secondary | ICD-10-CM | POA: Diagnosis not present

## 2023-02-26 DIAGNOSIS — Z79899 Other long term (current) drug therapy: Secondary | ICD-10-CM

## 2023-02-26 DIAGNOSIS — M25561 Pain in right knee: Secondary | ICD-10-CM | POA: Diagnosis present

## 2023-02-26 DIAGNOSIS — D638 Anemia in other chronic diseases classified elsewhere: Secondary | ICD-10-CM | POA: Diagnosis not present

## 2023-02-26 DIAGNOSIS — K921 Melena: Secondary | ICD-10-CM | POA: Diagnosis not present

## 2023-02-26 DIAGNOSIS — K5641 Fecal impaction: Secondary | ICD-10-CM | POA: Diagnosis not present

## 2023-02-26 DIAGNOSIS — M898X9 Other specified disorders of bone, unspecified site: Secondary | ICD-10-CM | POA: Diagnosis present

## 2023-02-26 DIAGNOSIS — Z9851 Tubal ligation status: Secondary | ICD-10-CM

## 2023-02-26 LAB — TYPE AND SCREEN
ABO/RH(D): B POS
Antibody Screen: NEGATIVE

## 2023-02-26 LAB — CBC
HCT: 33.9 % — ABNORMAL LOW (ref 36.0–46.0)
Hemoglobin: 10.8 g/dL — ABNORMAL LOW (ref 12.0–15.0)
MCH: 30.5 pg (ref 26.0–34.0)
MCHC: 31.9 g/dL (ref 30.0–36.0)
MCV: 95.8 fL (ref 80.0–100.0)
Platelets: 299 10*3/uL (ref 150–400)
RBC: 3.54 MIL/uL — ABNORMAL LOW (ref 3.87–5.11)
RDW: 20 % — ABNORMAL HIGH (ref 11.5–15.5)
WBC: 7.1 10*3/uL (ref 4.0–10.5)
nRBC: 0 % (ref 0.0–0.2)

## 2023-02-26 MED ORDER — MELOXICAM 5 MG PO CAPS: 5.0000 mg | ORAL_CAPSULE | Freq: Every day | ORAL | Status: DC

## 2023-02-26 MED ORDER — MORPHINE SULFATE (PF) 2 MG/ML IV SOLN
0.5000 mg | INTRAVENOUS | Status: DC | PRN
  Administered 2023-02-27: 0.5 mg via INTRAVENOUS
  Filled 2023-02-26: qty 1

## 2023-02-26 MED ORDER — ONDANSETRON HCL 4 MG PO TABS: 4.0000 mg | ORAL_TABLET | Freq: Every day | ORAL | Status: DC | PRN

## 2023-02-26 MED ORDER — HYDROCODONE-ACETAMINOPHEN 5-325 MG PO TABS
1.0000 | ORAL_TABLET | Freq: Four times a day (QID) | ORAL | Status: DC | PRN
  Administered 2023-02-27: 1 via ORAL
  Filled 2023-02-26: qty 1

## 2023-02-26 MED ORDER — SEVELAMER CARBONATE 800 MG PO TABS
800.0000 mg | ORAL_TABLET | Freq: Three times a day (TID) | ORAL | Status: DC
  Administered 2023-02-27 – 2023-03-04 (×6): 800 mg via ORAL
  Filled 2023-02-26 (×10): qty 1

## 2023-02-26 MED ORDER — MIRTAZAPINE 15 MG PO TABS
15.0000 mg | ORAL_TABLET | Freq: Every day | ORAL | Status: DC
  Administered 2023-02-26 – 2023-03-23 (×22): 15 mg via ORAL
  Filled 2023-02-26 (×24): qty 1

## 2023-02-26 MED ORDER — ARIPIPRAZOLE 5 MG PO TABS: 15.0000 mg | ORAL_TABLET | ORAL | Status: DC | PRN

## 2023-02-26 MED ORDER — ACETAMINOPHEN 325 MG PO TABS
650.0000 mg | ORAL_TABLET | Freq: Three times a day (TID) | ORAL | Status: DC
  Administered 2023-02-26 – 2023-03-21 (×50): 650 mg via ORAL
  Filled 2023-02-26 (×56): qty 2

## 2023-02-26 MED ORDER — HEPARIN SODIUM (PORCINE) 5000 UNIT/ML IJ SOLN
5000.0000 [IU] | Freq: Three times a day (TID) | INTRAMUSCULAR | Status: DC
  Administered 2023-02-26 – 2023-02-27 (×3): 5000 [IU] via SUBCUTANEOUS
  Filled 2023-02-26 (×3): qty 1

## 2023-02-26 MED ORDER — PANTOPRAZOLE SODIUM 40 MG PO TBEC
40.0000 mg | DELAYED_RELEASE_TABLET | Freq: Every day | ORAL | Status: DC
  Administered 2023-02-26 – 2023-03-09 (×8): 40 mg via ORAL
  Filled 2023-02-26 (×10): qty 1

## 2023-02-26 MED ORDER — SENNA 8.6 MG PO TABS
1.0000 | ORAL_TABLET | Freq: Two times a day (BID) | ORAL | Status: DC
  Administered 2023-02-26 – 2023-03-12 (×24): 8.6 mg via ORAL
  Filled 2023-02-26 (×25): qty 1

## 2023-02-26 MED ORDER — MIDODRINE HCL 5 MG PO TABS
15.0000 mg | ORAL_TABLET | Freq: Two times a day (BID) | ORAL | Status: DC
  Administered 2023-02-27 – 2023-03-01 (×3): 15 mg via ORAL
  Filled 2023-02-26 (×3): qty 3

## 2023-02-26 MED ORDER — CHLORHEXIDINE GLUCONATE CLOTH 2 % EX PADS
6.0000 | MEDICATED_PAD | Freq: Every day | CUTANEOUS | Status: DC
  Administered 2023-02-28 – 2023-03-13 (×14): 6 via TOPICAL

## 2023-02-26 MED ORDER — BENZTROPINE MESYLATE 0.5 MG PO TABS
0.5000 mg | ORAL_TABLET | Freq: Two times a day (BID) | ORAL | Status: DC
  Administered 2023-02-26 – 2023-03-14 (×24): 0.5 mg via ORAL
  Filled 2023-02-26 (×35): qty 1

## 2023-02-26 MED ORDER — SODIUM CHLORIDE 0.9 % IV SOLN: INTRAVENOUS | Status: DC

## 2023-02-26 MED ORDER — VALPROIC ACID 250 MG/5ML PO SOLN
250.0000 mg | Freq: Three times a day (TID) | ORAL | Status: DC
  Administered 2023-02-27 – 2023-03-17 (×33): 250 mg via ORAL
  Filled 2023-02-26 (×60): qty 5

## 2023-02-26 NOTE — Assessment & Plan Note (Signed)
Last HD date not known. AT [facility] K 5.8, Cr 10.75 on 02/25/23  Plan Stat Bmet  Dr. Ronalee Belts on board and will determine timing of HD

## 2023-02-26 NOTE — Progress Notes (Signed)
Brief Nephrology note: ESRD patient who is admitted for hip fracture being contact for ESRD.  Plan for possible OR tomorrow. Dialysis ordered for AM. Labs pending. AVF for the access. Discussed with primary team and dialysis nurse as well. No heparin. Full consult to follow.   DRonalee Belts, CKA.

## 2023-02-26 NOTE — Assessment & Plan Note (Addendum)
Patient with left femoral neck fracture.   Plan Ortho, Mr. Madison [name], [place], on board and ortho team will determine timing of ORIF  Routine hip fracture protocols

## 2023-02-26 NOTE — Assessment & Plan Note (Signed)
Patient very somnolent on exam and when aroused disoriented and agitated.  Plan Continue home medication regimen.

## 2023-02-26 NOTE — H&P (Addendum)
History and Physical    Madison [name] ZOX:[id number] DOB: November 18, 1950 DOA: 02/26/2023  DOS: the patient was seen and examined on 02/26/2023  PCP: System, Provider Not In   Patient coming from:  [facility] transfer  I have personally briefly reviewed patient's old medical records in [facility]  Madison [name] is a 72 y.o. female with a medical h/o ESRD, schizoaffective disorder, bipolar type,  who presented today to the [facility] emergency department complaining of a fall that occurred on Saturday, the  Circumstances of which are unclear. EMS reports that they were told by the nursing facility that patient fell but gave little additional information. Patient has been nonambulatory ever since then. Patient's family apparently came and visited today and that prompted the facility to send patient to the ED. Patient initially complained of left hip pain but it did not complain of bilateral hip pain. No other injuries were report or found. In the [facility] ED xray revealed a left femoral hip fracture. CXR w/ NAD, K 5.8, BUN 64, Cr 10.75, Glucose 116, Albumin 2.8, T. Protein 7.2, LFTs nl, PT/INR nl, CBC diff nl. HD services unavailable at [facility] and the patient subsequently accepted in transfer to [facility] service at [facility]. For Ortho Mr. Tinnie Gens, [place] and ortho service will see patient in consultation for ORIF probably 02/27/23.     ED Course: patient not seen in [facility] ED  Review of Systems:  Review of Systems  Unable to perform ROS: Psychiatric disorder    Past Medical History:  Diagnosis Date   Anxiety    Bipolar disorder (HCC)    ESRD (end stage renal disease) on dialysis (HCC)    Neuropathy    Schizophrenia (HCC)     Past Surgical History:  Procedure Laterality Date   CHOLECYSTECTOMY     COLONOSCOPY N/A 01/20/2016   Dr. Darrick Penna: 12 mm tubular adenoma removed from the transverse colon, 4 hyperplastic polyps removed from the rectum and sigmoid colon.  Next colonoscopy planned for April 2020.    ESOPHAGOGASTRODUODENOSCOPY N/A 12/19/2017   Procedure: ESOPHAGOGASTRODUODENOSCOPY (EGD);  Surgeon: West Bali, MD;  Location: [facility];  Service: Endoscopy;  Laterality: N/A;  8:30am   fistula left arm     GIVENS CAPSULE STUDY N/A 01/14/2018   Procedure: GIVENS CAPSULE STUDY;  Surgeon: West Bali, MD;  Location: [facility] SUITE;  Service: Endoscopy;  Laterality: N/A;  7:30am   HEMICOLECTOMY Right    TUBAL LIGATION     Soc Hx - Resides in long term care facility in [place]. Family is attentive   reports that she has never smoked. She has never used smokeless tobacco. She reports that she does not drink alcohol and does not use drugs.  No Known Allergies  Family History  Problem Relation Age of Onset   Alzheimer's disease Mother    Cancer Mother        pancreatic   Diabetes Mother    Prostate cancer Father    Kidney failure Sister    Breast cancer Neg Hx    Colon cancer Neg Hx     Prior to Admission medications   Medication Sig Start Date End Date Taking? Authorizing Provider  ARIPiprazole (ABILIFY) 15 MG tablet Take 15 mg by mouth daily.   Yes [provider]  B Complex-C-Folic Acid (DIALYVITE TABLET) TABS Take 1 tablet by mouth daily. 03/31/20  Yes [provider]  benztropine (COGENTIN) 0.5 MG tablet Take 0.5 mg by mouth daily.   Yes  [provider]  midodrine (PROAMATINE) 5 MG tablet Take 5 mg by mouth 3 (three) times daily with meals.   Yes [provider]  mirtazapine (REMERON) 15 MG tablet Take 15 mg by mouth at bedtime.   Yes [provider]  sevelamer (RENAGEL) 800 MG tablet Take 800 mg by mouth 3 (three) times daily with meals.   Yes [provider]  ABILIFY MAINTENA 300 MG PRSY prefilled syringe Inject 300 mg into the muscle every 30 (thirty) days. Patient not taking: Reported on 12/26/2022 10/22/22   [provider]  acetaminophen (TYLENOL) 325 MG tablet Take 650 mg by mouth 3 (three) times daily.     [provider]  aspirin EC 81 MG tablet Take 81 mg by mouth daily. Swallow whole. Patient not taking: Reported on 10/25/2022    [provider]  loperamide (IMODIUM) 2 MG capsule Take 1 capsule (2 mg total) by mouth as needed for diarrhea or loose stools. 12/30/22   Sherryll Burger, Pratik D, DO  Meloxicam 5 MG CAPS Take 5 mg by mouth daily. 09/04/22   [provider]  ondansetron (ZOFRAN) 4 MG tablet Take 1 tablet (4 mg total) by mouth daily as needed for nausea or vomiting. 12/30/22 12/30/23  Sherryll Burger, Pratik D, DO  pantoprazole (PROTONIX) 40 MG tablet TAKE 1 TABLET BY MOUTH ONCE DAILY. 05/26/19   Gelene Mink, NP  thiamine (VITAMIN B-1) 100 MG tablet Take 0.5 tablets (50 mg total) by mouth daily. Patient not taking: Reported on 10/25/2022 01/08/19   Clapacs, Jackquline Denmark, MD  valproic acid (DEPAKENE) 250 MG/5ML solution Take 250 mg by mouth 3 (three) times daily with meals. 08/14/22   [provider]    Physical Exam: Vitals:   02/26/23 1930  BP: (!) 149/70  Pulse: 91  Resp: 16  Temp: 98.1 F (36.7 C)  TempSrc: Oral  SpO2: 96%    Physical Exam Vitals and nursing note reviewed.  Constitutional:      General: She is not in acute distress.    Appearance: Normal appearance. She is normal weight.     Comments: Very somnolent, but when aroused is agitated and combative  HENT:     Head: Normocephalic and atraumatic.  Eyes:     Extraocular Movements: Extraocular movements intact.     Pupils: Pupils are equal, round, and reactive to light.     Comments: Disconjugate gaze - chronic - OD deviated laterally. ARCUS senilis R>L. PERRL  Cardiovascular:     Rate and Rhythm: Normal rate and regular rhythm.     Pulses: Normal pulses.     Heart sounds: Normal heart sounds.     Comments: Loud rushing sound from HD fistula Left upper extremity Pulmonary:     Effort: Pulmonary effort is normal.     Breath sounds: Normal breath sounds.  Abdominal:     Palpations: Abdomen is soft.      Tenderness: There is no abdominal tenderness. There is no guarding.  Musculoskeletal:        General: Tenderness and deformity present. No swelling.     Cervical back: No rigidity.     Right lower leg: No edema.     Left lower leg: No edema.     Comments: Mild outward rotation left leg. Very tender to any movement. No open wound at left hip or bruising.  Skin:    General: Skin is warm and dry.  Neurological:     Comments: No facial asymmetry, EOMI, MAE. Per  RN does follow some commands.   Psychiatric:     Comments: Unresponsive without verbal stimulation or touch.  When aroused - agitated.       Labs on Admission: I have personally reviewed following labs and imaging studies  CBC: No results for input(s): "WBC", "NEUTROABS", "HGB", "HCT", "MCV", "PLT" in the last 168 hours. Basic Metabolic Panel: No results for input(s): "NA", "K", "CL", "CO2", "GLUCOSE", "BUN", "CREATININE", "CALCIUM", "MG", "PHOS" in the last 168 hours. GFR: CrCl cannot be calculated (Patient's most recent lab result is older than the maximum 21 days allowed.). Liver Function Tests: No results for input(s): "AST", "ALT", "ALKPHOS", "BILITOT", "PROT", "ALBUMIN" in the last 168 hours. No results for input(s): "LIPASE", "AMYLASE" in the last 168 hours. No results for input(s): "AMMONIA" in the last 168 hours. Coagulation Profile: No results for input(s): "INR", "PROTIME" in the last 168 hours. Cardiac Enzymes: No results for input(s): "CKTOTAL", "CKMB", "CKMBINDEX", "TROPONINI" in the last 168 hours. BNP (last 3 results) No results for input(s): "PROBNP" in the last 8760 hours. HbA1C: No results for input(s): "HGBA1C" in the last 72 hours. CBG: No results for input(s): "GLUCAP" in the last 168 hours. Lipid Profile: No results for input(s): "CHOL", "HDL", "LDLCALC", "TRIG", "CHOLHDL", "LDLDIRECT" in the last 72 hours. Thyroid Function Tests: No results for input(s): "TSH", "T4TOTAL", "FREET4", "T3FREE",  "THYROIDAB" in the last 72 hours. Anemia Panel: No results for input(s): "VITAMINB12", "FOLATE", "FERRITIN", "TIBC", "IRON", "RETICCTPCT" in the last 72 hours. Urine analysis:    Component Value Date/Time   COLORURINE YELLOW (A) 08/15/2017 1155   APPEARANCEUR HAZY (A) 08/15/2017 1155   LABSPEC 1.007 08/15/2017 1155   PHURINE 8.0 08/15/2017 1155   GLUCOSEU NEGATIVE 08/15/2017 1155   HGBUR NEGATIVE 08/15/2017 1155   BILIRUBINUR NEGATIVE 08/15/2017 1155   KETONESUR NEGATIVE 08/15/2017 1155   PROTEINUR >=300 (A) 08/15/2017 1155   NITRITE NEGATIVE 08/15/2017 1155   LEUKOCYTESUR SMALL (A) 08/15/2017 1155    Radiological Exams on Admission: I have personally reviewed images Chest Portable 1 View  Result Date: 02/26/2023 CLINICAL DATA:  Preop hip fracture EXAM: PORTABLE CHEST 1 VIEW COMPARISON:  Chest x-ray 02/25/2023 FINDINGS: Heart is enlarged. There central pulmonary vascular congestion. There is no focal lung infiltrate, pleural effusion or pneumothorax. Vascular stent is seen in the left arm. There surgical clips in the right abdomen. No acute fractures. IMPRESSION: Cardiomegaly with central pulmonary vascular congestion. Electronically Signed   By: Darliss Cheney M.D.   On: 02/26/2023 21:11    EKG: I have personally reviewed EKG: EKG pending  Assessment/Plan Active Problems:   ESRD on hemodialysis (HCC)   Schizoaffective disorder, bipolar type (HCC)   Hip fracture, left (HCC)    Assessment and Plan: Hip fracture, left [facility]) Patient with left femoral neck fracture.   Plan Ortho, Mr. Tinnie Gens, [place], on board and ortho team will determine timing of ORIF  Routine hip fracture protocols  Schizoaffective disorder, bipolar type [facility]) Patient very somnolent on exam and when aroused disoriented and agitated.  Plan Continue home medication regimen.   ESRD on hemodialysis (HCC) Last HD date not known. AT [facility] K 5.8, Cr 10.75 on 02/25/23  Plan Stat Bmet  Dr. Ronalee Belts on board and will  determine timing of HD       DVT prophylaxis: SQ Heparin Code Status: Full Code based on old records. Patient unable to make determination. Immediate family not available Family Communication: spoke with Edwyna Perfect, cousin. Sister Guila Homola not available. Reported plan of treatment  Disposition Plan: TBD  Consults called: orther, Mr. Tamela Gammon, PA has been consulted by [facility] EDP  Admission status: Inpatient, Med-Surg   Illene Regulus, MD [facility] 02/26/2023, 9:16 PM

## 2023-02-26 NOTE — Subjective & Objective (Addendum)
Madison [name] is a 72 y.o. female with a medical h/o ESRD, schizoaffective disorder, bipolar type,  who presented today to the [facility] emergency department complaining of a fall that occurred on Saturday, the  Circumstances of which are unclear. EMS reports that they were told by the nursing facility that patient fell but gave little additional information. Patient has been nonambulatory ever since then. Patient's family apparently came and visited today and that prompted the facility to send patient to the ED. Patient initially complained of left hip pain but it did not complain of bilateral hip pain. No other injuries were report or found. In the [facility] ED xray revealed a left femoral hip fracture. CXR w/ NAD, K 5.8, BUN 64, Cr 10.75, Glucose 116, Albumin 2.8, T. Protein 7.2, LFTs nl, PT/INR nl, CBC diff nl. HD services unavailable at [facility] and the patient subsequently accepted in transfer to [facility] service at [facility]. For Ortho Mr. Tinnie Gens, [place] and ortho service will see patient in consultation for ORIF probably 02/27/23.

## 2023-02-27 ENCOUNTER — Encounter (HOSPITAL_COMMUNITY): Payer: Self-pay | Admitting: Anesthesiology

## 2023-02-27 DIAGNOSIS — Z992 Dependence on renal dialysis: Secondary | ICD-10-CM | POA: Diagnosis not present

## 2023-02-27 DIAGNOSIS — S72002D Fracture of unspecified part of neck of left femur, subsequent encounter for closed fracture with routine healing: Secondary | ICD-10-CM | POA: Diagnosis not present

## 2023-02-27 DIAGNOSIS — F250 Schizoaffective disorder, bipolar type: Secondary | ICD-10-CM | POA: Diagnosis not present

## 2023-02-27 DIAGNOSIS — N186 End stage renal disease: Secondary | ICD-10-CM | POA: Diagnosis not present

## 2023-02-27 LAB — RENAL FUNCTION PANEL
Albumin: 2.5 g/dL — ABNORMAL LOW (ref 3.5–5.0)
Anion gap: 20 — ABNORMAL HIGH (ref 5–15)
BUN: 83 mg/dL — ABNORMAL HIGH (ref 8–23)
CO2: 18 mmol/L — ABNORMAL LOW (ref 22–32)
Calcium: 8.6 mg/dL — ABNORMAL LOW (ref 8.9–10.3)
Chloride: 98 mmol/L (ref 98–111)
Creatinine, Ser: 11.9 mg/dL — ABNORMAL HIGH (ref 0.44–1.00)
GFR, Estimated: 3 mL/min — ABNORMAL LOW (ref >60)
Glucose, Bld: 68 mg/dL — ABNORMAL LOW (ref 70–99)
Phosphorus: 6.6 mg/dL — ABNORMAL HIGH (ref 2.5–4.6)
Potassium: 5.4 mmol/L — ABNORMAL HIGH (ref 3.5–5.1)
Sodium: 136 mmol/L (ref 135–145)

## 2023-02-27 LAB — BASIC METABOLIC PANEL
Anion gap: 18 — ABNORMAL HIGH (ref 5–15)
BUN: 83 mg/dL — ABNORMAL HIGH (ref 8–23)
CO2: 22 mmol/L (ref 22–32)
Calcium: 9.7 mg/dL (ref 8.9–10.3)
Chloride: 98 mmol/L (ref 98–111)
Creatinine, Ser: 12.35 mg/dL — ABNORMAL HIGH (ref 0.44–1.00)
GFR, Estimated: 3 mL/min — ABNORMAL LOW (ref >60)
Glucose, Bld: 76 mg/dL (ref 70–99)
Potassium: 6.4 mmol/L (ref 3.5–5.1)
Sodium: 138 mmol/L (ref 135–145)

## 2023-02-27 LAB — CBC
HCT: 30.9 % — ABNORMAL LOW (ref 36.0–46.0)
Hemoglobin: 9.7 g/dL — ABNORMAL LOW (ref 12.0–15.0)
MCH: 29.8 pg (ref 26.0–34.0)
MCHC: 31.4 g/dL (ref 30.0–36.0)
MCV: 94.8 fL (ref 80.0–100.0)
Platelets: 279 10*3/uL (ref 150–400)
RBC: 3.26 MIL/uL — ABNORMAL LOW (ref 3.87–5.11)
RDW: 19.9 % — ABNORMAL HIGH (ref 11.5–15.5)
WBC: 5.8 10*3/uL (ref 4.0–10.5)
nRBC: 0.3 % — ABNORMAL HIGH (ref 0.0–0.2)

## 2023-02-27 LAB — HEPATITIS B SURFACE ANTIGEN: Hepatitis B Surface Ag: NONREACTIVE

## 2023-02-27 MED ORDER — LIDOCAINE HCL (PF) 1 % IJ SOLN
5.0000 mL | INTRAMUSCULAR | Status: DC | PRN
Start: 2023-02-27 — End: 2023-02-27

## 2023-02-27 MED ORDER — HEPARIN SODIUM (PORCINE) 1000 UNIT/ML DIALYSIS: 1000.0000 [IU] | INTRAMUSCULAR | Status: DC | PRN

## 2023-02-27 MED ORDER — LIDOCAINE-PRILOCAINE 2.5-2.5 % EX CREA: 1.0000 | TOPICAL_CREAM | CUTANEOUS | Status: DC | PRN

## 2023-02-27 MED ORDER — ANTICOAGULANT SODIUM CITRATE 4% (200MG/5ML) IV SOLN: 5.0000 mL | Status: DC | PRN

## 2023-02-27 MED ORDER — OXYCODONE HCL 5 MG PO TABS
5.0000 mg | ORAL_TABLET | ORAL | Status: DC | PRN
  Administered 2023-02-28 – 2023-03-01 (×3): 5 mg via ORAL
  Administered 2023-03-02 – 2023-03-09 (×11): 10 mg via ORAL
  Administered 2023-03-09: 5 mg via ORAL
  Administered 2023-03-10: 10 mg via ORAL
  Administered 2023-03-12 (×2): 5 mg via ORAL
  Administered 2023-03-13: 10 mg via ORAL
  Administered 2023-03-14 – 2023-03-15 (×2): 5 mg via ORAL
  Administered 2023-03-15: 10 mg via ORAL
  Administered 2023-03-19 – 2023-03-20 (×2): 5 mg via ORAL
  Filled 2023-02-27 (×3): qty 2
  Filled 2023-02-27 (×2): qty 1
  Filled 2023-02-27 (×2): qty 2
  Filled 2023-02-27: qty 1
  Filled 2023-02-27 (×2): qty 2
  Filled 2023-02-27 (×2): qty 1
  Filled 2023-02-27 (×2): qty 2
  Filled 2023-02-27: qty 1
  Filled 2023-02-27: qty 2
  Filled 2023-02-27: qty 1
  Filled 2023-02-27: qty 2
  Filled 2023-02-27: qty 1
  Filled 2023-02-27 (×3): qty 2
  Filled 2023-02-27: qty 1
  Filled 2023-02-27 (×2): qty 2

## 2023-02-27 MED ORDER — ALTEPLASE 2 MG IJ SOLR
2.0000 mg | Freq: Once | INTRAMUSCULAR | Status: DC | PRN
Start: 2023-02-27 — End: 2023-02-27

## 2023-02-27 MED ORDER — PENTAFLUOROPROP-TETRAFLUOROETH EX AERO: 1.0000 | INHALATION_SPRAY | CUTANEOUS | Status: DC | PRN

## 2023-02-27 MED ORDER — FENTANYL CITRATE PF 50 MCG/ML IJ SOSY
12.5000 ug | PREFILLED_SYRINGE | INTRAMUSCULAR | Status: DC | PRN
  Administered 2023-03-02 – 2023-03-12 (×8): 12.5 ug via INTRAVENOUS
  Filled 2023-02-27 (×9): qty 1

## 2023-02-27 NOTE — Progress Notes (Signed)
   Madison [name]  ZOX:[id number] DOB: 1951/06/22 DOA: 02/26/2023 PCP: System, Provider Not In    Brief Narrative:  72 year old SNF resident with a history of ESRD, HTN, and schizophrenia versus bipolar disorder who presented to [facility] ER after a fall which occurred multiple days prior.  X-rays at the [facility] ER revealed a left femoral fracture.  The patient was transferred to [facility] as she needed a combination of an orthopedist as well as access to inpatient dialysis.  Consultants:  Orthopedic Surgery  Goals of Care:  Code Status: Full Code   DVT prophylaxis: Subcutaneous heparin  Interim Hx: Afebrile.  Vital signs stable.  Resting comfortably in bed.  Not agitated.  Assessment & Plan:  Left femoral neck fracture Care per orthopedic surgery  Schizoaffective disorder, bipolar type Continue usual home medical regimen  ESRD on HD MWF Nephrology consulted and patient underwent dialysis today   Family Communication: Spoke with 2 sisters at the bedside Disposition: Anticipate return to SNF after cleared from orthopedic standpoint   Objective: Blood pressure (!) 147/83, pulse 89, temperature 98.1 F (36.7 C), temperature source Oral, resp. rate 14, height 5\' 4"  (1.626 m), weight 49.4 kg, SpO2 100 %. No intake or output data in the 24 hours ending 02/27/23 1133 Filed Weights   02/27/23 0300  Weight: 49.4 kg    Examination: General: No acute respiratory distress Lungs: Clear to auscultation bilaterally  Cardiovascular: Regular rate and rhythm without murmur Abdomen: Nontender, nondistended, soft, bowel sounds positive Extremities: No significant edema bilateral lower extremities  CBC: Recent Labs  Lab 02/26/23 2044 02/27/23 0650  WBC 7.1 5.8  HGB 10.8* 9.7*  HCT 33.9* 30.9*  MCV 95.8 94.8  PLT 299 279   Basic Metabolic Panel: Recent Labs  Lab 02/26/23 2044 02/27/23 0650  NA 138 136  K 6.4* 5.4*  CL 98 98  CO2 22 18*  GLUCOSE 76 68*  BUN 83* 83*   CREATININE 12.35* 11.90*  CALCIUM 9.7 8.6*  PHOS  --  6.6*   GFR: Estimated Creatinine Clearance: 3.3 mL/min (A) (by C-G formula based on SCr of 11.9 mg/dL (H)).   Scheduled Meds:  acetaminophen  650 mg Oral TID   benztropine  0.5 mg Oral BID   Chlorhexidine Gluconate Cloth  6 each Topical Q0600   heparin  5,000 Units Subcutaneous Q8H   midodrine  15 mg Oral BID WC   mirtazapine  15 mg Oral QHS   pantoprazole  40 mg Oral Daily   senna  1 tablet Oral BID   sevelamer carbonate  800 mg Oral TID WC   valproic acid  250 mg Oral TID WC     LOS: 1 day   Lonia Blood, MD [facility] Office  [phone] Pager - Text Page per Loretha Stapler  If 7PM-7AM, please contact night-coverage per Amion 02/27/2023, 11:33 AM

## 2023-02-27 NOTE — TOC CAGE-AID Note (Signed)
Transition of Care [facility] [place]) - CAGE-AID Screening   Patient Details  Name: Madison [name] MRN: [id number] Date of Birth: 07-23-1951  Transition of Care [facility]) CM/SW Contact:    Leota Sauers, RN Phone Number: 02/27/2023, 9:57 PM   Clinical Narrative:  Patient denied use of alcohol and illicit drugs. Education not offered at this time.  CAGE-AID Screening:    Have You Ever Felt You Ought to Cut Down on Your Drinking or Drug Use?: No Have People Annoyed You By Critizing Your Drinking Or Drug Use?: No Have You Felt Bad Or Guilty About Your Drinking Or Drug Use?: No Have You Ever Had a Drink or Used Drugs First Thing In The Morning to Steady Your Nerves or to Get Rid of a Hangover?: No CAGE-AID Score: 0  Substance Abuse Education Offered: No

## 2023-02-27 NOTE — Progress Notes (Signed)
Received patient in bed to unit.  Alert and oriented.  Informed consent signed and in chart.   TX duration:2:45, tx terminated early due to system wont run  Patient tolerated well.  Transported back to the room  Alert, without acute distress.  Hand-off given to patient's nurse.   Access used: left AVF Access issues: VP kept alarming   Total UF removed: 1.8L Medication(s) given: diluadid   02/27/23 1249  Vitals  Temp 98.3 F (36.8 C)  Temp Source Oral  BP 121/71  MAP (mmHg) 87  BP Location Right Arm  BP Method Automatic  Patient Position (if appropriate) Lying  Pulse Rate 100  Pulse Rate Source Monitor  ECG Heart Rate 100  Resp 16  Oxygen Therapy  SpO2 100 %  O2 Device Nasal Cannula  O2 Flow Rate (L/min) 2 L/min  During Treatment Monitoring  HD Safety Checks Performed Yes  Intra-Hemodialysis Comments Tx completed (during the whole tx VP alarms, tx terminated with 43 minutes remaing due to machine not running due to VP, Dr. Ronalee Belts aware.)  Dialysis Fluid Bolus Normal Saline  Bolus Amount (mL) 300 mL    [name]

## 2023-02-27 NOTE — Progress Notes (Signed)
Pt receives out-pt HD at [facility] on MWF. Appears pt may have been admitted from snf per medical record. Will assist as needed.   Olivia Canter Renal Navigator [phone]

## 2023-02-27 NOTE — Anesthesia Preprocedure Evaluation (Signed)
Anesthesia Evaluation    Reviewed: Allergy & Precautions, Patient's Chart, lab work & pertinent test results, Unable to perform ROS - Chart review only  Airway        Dental   Pulmonary pneumonia          Cardiovascular + Past MI  + Valvular Problems/Murmurs   Echo 12/2022  1. Limited Echo for LVEF assessment.   2. Left ventricular ejection fraction, by estimation, is 60 to 65%. The left ventricle has normal function. The left ventricle has no regional wall motion abnormalities. Left ventricular diastolic function not assessed.   3. Right ventricular systolic function is normal. The right ventricular size is normal. Tricuspid regurgitation signal is inadequate for assessing PA pressure.   4. The inferior vena cava is normal in size with greater than 50% respiratory variability, suggesting right atrial pressure of 3 mmHg.   Comparison(s): No significant change from prior study.     Neuro/Psych  PSYCHIATRIC DISORDERS Anxiety  Bipolar Disorder Schizophrenia  negative neurological ROS     GI/Hepatic negative GI ROS, Neg liver ROS,,,  Endo/Other  negative endocrine ROS    Renal/GU ESRF and DialysisRenal disease     Musculoskeletal negative musculoskeletal ROS (+)    Abdominal   Peds  Hematology  (+) Blood dyscrasia, anemia   Anesthesia Other Findings   Reproductive/Obstetrics                             Anesthesia Physical Anesthesia Plan  ASA: 3  Anesthesia Plan: Regional   Post-op Pain Management:    Induction:   PONV Risk Score and Plan:   Airway Management Planned:   Additional Equipment:   Intra-op Plan:   Post-operative Plan:   Informed Consent: I have reviewed the patients History and Physical, chart, labs and discussed the procedure including the risks, benefits and alternatives for the proposed anesthesia with the patient or authorized representative who has indicated his/her  understanding and acceptance.       Plan Discussed with:   Anesthesia Plan Comments:        Anesthesia Quick Evaluation

## 2023-02-27 NOTE — Anesthesia Preprocedure Evaluation (Addendum)
Anesthesia Evaluation  Patient identified by MRN, date of birth, ID band Patient confused    Reviewed: Allergy & Precautions, NPO status , Patient's Chart, lab work & pertinent test results, Unable to perform ROS - Chart review only  History of Anesthesia Complications Negative for: history of anesthetic complications  Airway Mallampati: Unable to assess       Dental  (+) Dental Advisory Given   Pulmonary neg pulmonary ROS   Pulmonary exam normal breath sounds clear to auscultation       Cardiovascular + Past MI  (-) dysrhythmias (Mobitz II second degree AV block) + Valvular Problems/Murmurs  Rhythm:Regular Rate:Normal  TTE 12/27/2022: IMPRESSIONS     1. Limited Echo for LVEF assessment.   2. Left ventricular ejection fraction, by estimation, is 60 to 65%. The  left ventricle has normal function. The left ventricle has no regional  wall motion abnormalities. Left ventricular diastolic function not  assessed.   3. Right ventricular systolic function is normal. The right ventricular  size is normal. Tricuspid regurgitation signal is inadequate for assessing  PA pressure.   4. The inferior vena cava is normal in size with greater than 50%  respiratory variability, suggesting right atrial pressure of 3 mmHg.     Neuro/Psych  PSYCHIATRIC DISORDERS Anxiety  Bipolar Disorder Schizophrenia  meningioma  Neuromuscular disease (neuropathy)    GI/Hepatic ,GERD  Medicated,,  Endo/Other    Renal/GU ESRF and DialysisRenal disease: last HD 02/27/2023.     Musculoskeletal   Abdominal   Peds  Hematology  (+) Blood dyscrasia, anemia   Anesthesia Other Findings   Reproductive/Obstetrics                             Anesthesia Physical Anesthesia Plan  ASA: 3  Anesthesia Plan: General   Post-op Pain Management:    Induction: Intravenous  PONV Risk Score and Plan: 3 and Ondansetron, Dexamethasone  and Treatment may vary due to age or medical condition  Airway Management Planned: Oral ETT  Additional Equipment:   Intra-op Plan:   Post-operative Plan: Extubation in OR  Informed Consent: I have reviewed the patients History and Physical, chart, labs and discussed the procedure including the risks, benefits and alternatives for the proposed anesthesia with the patient or authorized representative who has indicated his/her understanding and acceptance.     Dental advisory given  Plan Discussed with: CRNA and Anesthesiologist  Anesthesia Plan Comments: (Patient with altered mental status and cussing. Not cooperative with questions and not consentable. History obtained from chart review. Discussed anesthesia plan and risks with patient's sister, Tashiba Rawlins via phone at [phone].  Risks of general anesthesia discussed including, but not limited to, sore throat, hoarse voice, chipped/damaged teeth, injury to vocal cords, nausea and vomiting, allergic reactions, lung infection, heart attack, stroke, and death. All questions answered. )       Anesthesia Quick Evaluation

## 2023-02-27 NOTE — Consult Note (Signed)
Reason for Consult:Left hip fx Referring Physician: Jetty Duhamel Time called: 1610 Time at bedside: 0911   Madison [name] is an 72 y.o. female.  HPI: Madison [name] fell at the SNF where she resides. She was taken to the ED at [facility] where x-rays showed a left hip fx. Due to her need for HD transfer was requested to [facility]. It took a couple of days for her to arrive. She is clearly confused today but unsure if this is baseline.  Past Medical History:  Diagnosis Date   Anxiety    Bipolar disorder (HCC)    ESRD (end stage renal disease) on dialysis (HCC)    Neuropathy    Schizophrenia (HCC)     Past Surgical History:  Procedure Laterality Date   CHOLECYSTECTOMY     COLONOSCOPY N/A 01/20/2016   Dr. Darrick Penna: 12 mm tubular adenoma removed from the transverse colon, 4 hyperplastic polyps removed from the rectum and sigmoid colon.  Next colonoscopy planned for April 2020.   ESOPHAGOGASTRODUODENOSCOPY N/A 12/19/2017   Procedure: ESOPHAGOGASTRODUODENOSCOPY (EGD);  Surgeon: West Bali, MD;  [facility];  Service: Endoscopy;  Laterality: N/A;  8:30am   fistula left arm     GIVENS CAPSULE STUDY N/A 01/14/2018   Procedure: GIVENS CAPSULE STUDY;  Surgeon: West Bali, MD;  Location: [facility];  Service: Endoscopy;  Laterality: N/A;  7:30am   HEMICOLECTOMY Right    TUBAL LIGATION      Family History  Problem Relation Age of Onset   Alzheimer's disease Mother    Cancer Mother        pancreatic   Diabetes Mother    Prostate cancer Father    Kidney failure Sister    Breast cancer Neg Hx    Colon cancer Neg Hx     Social History:  reports that she has never smoked. She has never used smokeless tobacco. She reports that she does not drink alcohol and does not use drugs.  Allergies: No Known Allergies  Medications: I have reviewed the patient's current medications.  Results for orders placed or performed during the hospital encounter of 02/26/23 (from the past 48  hour(s))  CBC     Status: Abnormal   Collection Time: 02/26/23  8:44 PM  Result Value Ref Range   WBC 7.1 4.0 - 10.5 K/uL   RBC 3.54 (L) 3.87 - 5.11 MIL/uL   Hemoglobin 10.8 (L) 12.0 - 15.0 g/dL   HCT 96.0 (L) 45.4 - 09.8 %   MCV 95.8 80.0 - 100.0 fL   MCH 30.5 26.0 - 34.0 pg   MCHC 31.9 30.0 - 36.0 g/dL   RDW 11.9 (H) 14.7 - 82.9 %   Platelets 299 150 - 400 K/uL   nRBC 0.0 0.0 - 0.2 %    Comment: Performed at [facility], [address] [place], [place] [address]  Basic metabolic panel     Status: Abnormal   Collection Time: 02/26/23  8:44 PM  Result Value Ref Range   Sodium 138 135 - 145 mmol/L   Potassium 6.4 (HH) 3.5 - 5.1 mmol/L    Comment: HEMOLYSIS AT THIS LEVEL MAY AFFECT RESULT CRITICAL RESULT CALLED TO, READ BACK BY AND VERIFIED WITH Whitman Hero, RN. [phone] 02/27/23. LPAIT    Chloride 98 98 - 111 mmol/L   CO2 22 22 - 32 mmol/L   Glucose, Bld 76 70 - 99 mg/dL    Comment: Glucose reference range applies only to samples taken  after fasting for at least 8 hours.   BUN 83 (H) 8 - 23 mg/dL   Creatinine, Ser 16.10 (H) 0.44 - 1.00 mg/dL   Calcium 9.7 8.9 - 96.0 mg/dL   GFR, Estimated 3 (L) >60 mL/min    Comment: (NOTE) Calculated using the CKD-EPI Creatinine Equation (2021)    Anion gap 18 (H) 5 - 15    Comment: Performed at [facility], [address], [place], [place] [address]  Type and screen [facility]     Status: None   Collection Time: 02/26/23 10:02 PM  Result Value Ref Range   ABO/RH(D) B POS    Antibody Screen NEG    Sample Expiration      03/01/2023,2359 Performed at [facility], [address] [place], [place] [address]   Renal function panel     Status: Abnormal   Collection Time: 02/27/23  6:50 AM  Result Value Ref Range   Sodium 136 135 - 145 mmol/L   Potassium 5.4 (H) 3.5 - 5.1 mmol/L   Chloride 98 98 - 111 mmol/L   CO2 18 (L) 22 - 32 mmol/L   Glucose, Bld 68 (L) 70 - 99 mg/dL    Comment: Glucose reference range  applies only to samples taken after fasting for at least 8 hours.   BUN 83 (H) 8 - 23 mg/dL   Creatinine, Ser 47.82 (H) 0.44 - 1.00 mg/dL   Calcium 8.6 (L) 8.9 - 10.3 mg/dL   Phosphorus 6.6 (H) 2.5 - 4.6 mg/dL   Albumin 2.5 (L) 3.5 - 5.0 g/dL   GFR, Estimated 3 (L) >60 mL/min    Comment: (NOTE) Calculated using the CKD-EPI Creatinine Equation (2021)    Anion gap 20 (H) 5 - 15    Comment: Performed at [facility], [address], [place], [place] [address]  CBC     Status: Abnormal   Collection Time: 02/27/23  6:50 AM  Result Value Ref Range   WBC 5.8 4.0 - 10.5 K/uL   RBC 3.26 (L) 3.87 - 5.11 MIL/uL   Hemoglobin 9.7 (L) 12.0 - 15.0 g/dL   HCT 30.8 (L) 65.7 - 84.6 %   MCV 94.8 80.0 - 100.0 fL   MCH 29.8 26.0 - 34.0 pg   MCHC 31.4 30.0 - 36.0 g/dL   RDW 96.2 (H) 95.2 - 84.1 %   Platelets 279 150 - 400 K/uL   nRBC 0.3 (H) 0.0 - 0.2 %    Comment: Performed at [facility], [address], [place], [place] [address]    Chest Portable 1 View  Result Date: 02/26/2023 CLINICAL DATA:  Preop hip fracture EXAM: PORTABLE CHEST 1 VIEW COMPARISON:  Chest x-ray 02/25/2023 FINDINGS: Heart is enlarged. There central pulmonary vascular congestion. There is no focal lung infiltrate, pleural effusion or pneumothorax. Vascular stent is seen in the left arm. There surgical clips in the right abdomen. No acute fractures. IMPRESSION: Cardiomegaly with central pulmonary vascular congestion. Electronically Signed   By: Darliss Cheney M.D.   On: 02/26/2023 21:11    Review of Systems  Unable to perform ROS: Mental status change   Blood pressure (!) 146/69, pulse 91, temperature 98.1 F (36.7 C), temperature source Oral, resp. rate 16, height 5\' 4"  (1.626 m), weight 49.4 kg, SpO2 100 %. Physical Exam Constitutional:      General: She is not in acute distress.    Appearance: She is well-developed. She is not  diaphoretic.  HENT:     Head: Normocephalic and atraumatic.  Eyes:     General: No  scleral icterus.       Right eye: No discharge.        Left eye: No discharge.     Conjunctiva/sclera: Conjunctivae normal.  Cardiovascular:     Rate and Rhythm: Normal rate and regular rhythm.  Pulmonary:     Effort: Pulmonary effort is normal. No respiratory distress.  Musculoskeletal:     Cervical back: Normal range of motion.     Comments: LLE No traumatic wounds, ecchymosis, or rash  Mod TTP hip  No knee or ankle effusion  Knee stable to varus/ valgus and anterior/posterior stress  Sens DPN, SPN, TN intact  Motor EHL, ext, flex, evers 5/5  DP 2+, PT 2+, No significant edema  Skin:    General: Skin is warm and dry.  Neurological:     Mental Status: She is alert.  Psychiatric:        Mood and Affect: Mood normal.        Behavior: Behavior normal.     Assessment/Plan: Left hip fx -- Plan hip hemi tomorrow with Dr. Carola Frost. Please keep NPO after MN. Multiple medical problems including ESRD on HD and schizoaffective disorder, bipolar type -- per primary service    Freeman Caldron, PA-C Orthopedic Surgery [phone] 02/27/2023, 9:13 AM

## 2023-02-27 NOTE — Consult Note (Addendum)
[facility] Nephrology Consult Note: Reason for Consult: To manage dialysis and dialysis related needs Referring Physician: Dr. Sharon Seller, J  HPI:  Madison [name] is an 72 y.o. female with past medical history significant for hypertension, schizoaffective disorder, anemia, bipolar mood disorder, ESRD on HD MWF who was presented after a fall sustaining hip fracture seen as a consultation for the management of ESRD. She lives in nursing home and initially presented to [facility] ER after a fall.  The detail about the incident is unknown.  In the ER she was complaining of left hip pain and x-ray revealed left femoral hip fracture.  Apparently the potassium was elevated 5.8.  She was transferred to [facility] for Ortho as well as managing dialysis. Her blood pressure is acceptable and using around 2 L of oxygen.  The labs showed potassium of 6.4, BUN 83, hemoglobin 10.8.  She is currently receiving dialysis.  We do not know when was her last treatment.  From the previous note the patient receives dialysis at [facility] in [place], MWF, 225 minutes, 2K, 2.5 calcium, left upper extremity AV fistula for the access. The patient is currently pleasantly confused.  Review of system limited.  Denies nausea, vomiting, chest pain, shortness of breath.  Past Medical History:  Diagnosis Date   Anxiety    Bipolar disorder (HCC)    ESRD (end stage renal disease) on dialysis (HCC)    Neuropathy    Schizophrenia (HCC)     Past Surgical History:  Procedure Laterality Date   CHOLECYSTECTOMY     COLONOSCOPY N/A 01/20/2016   Dr. Darrick Penna: 12 mm tubular adenoma removed from the transverse colon, 4 hyperplastic polyps removed from the rectum and sigmoid colon.  Next colonoscopy planned for April 2020.   ESOPHAGOGASTRODUODENOSCOPY N/A 12/19/2017   Procedure: ESOPHAGOGASTRODUODENOSCOPY (EGD);  Surgeon: West Bali, MD;  [facility];  Service: Endoscopy;  Laterality: N/A;  8:30am   fistula left arm      GIVENS CAPSULE STUDY N/A 01/14/2018   Procedure: GIVENS CAPSULE STUDY;  Surgeon: West Bali, MD;  [facility];  Service: Endoscopy;  Laterality: N/A;  7:30am   HEMICOLECTOMY Right    TUBAL LIGATION      Family History  Problem Relation Age of Onset   Alzheimer's disease Mother    Cancer Mother        pancreatic   Diabetes Mother    Prostate cancer Father    Kidney failure Sister    Breast cancer Neg Hx    Colon cancer Neg Hx     Social History:  reports that she has never smoked. She has never used smokeless tobacco. She reports that she does not drink alcohol and does not use drugs.  Allergies: No Known Allergies  Medications: I have reviewed the patient's current medications.   Results for orders placed or performed during the hospital encounter of 02/26/23 (from the past 48 hour(s))  CBC     Status: Abnormal   Collection Time: 02/26/23  8:44 PM  Result Value Ref Range   WBC 7.1 4.0 - 10.5 K/uL   RBC 3.54 (L) 3.87 - 5.11 MIL/uL   Hemoglobin 10.8 (L) 12.0 - 15.0 g/dL   HCT 40.9 (L) 81.1 - 91.4 %   MCV 95.8 80.0 - 100.0 fL   MCH 30.5 26.0 - 34.0 pg   MCHC 31.9 30.0 - 36.0 g/dL   RDW 78.2 (H) 95.6 - 21.3 %   Platelets 299 150 - 400  K/uL   nRBC 0.0 0.0 - 0.2 %    Comment: Performed at [facility], [address] [place], [place] [address]  Basic metabolic panel     Status: Abnormal   Collection Time: 02/26/23  8:44 PM  Result Value Ref Range   Sodium 138 135 - 145 mmol/L   Potassium 6.4 (HH) 3.5 - 5.1 mmol/L    Comment: HEMOLYSIS AT THIS LEVEL MAY AFFECT RESULT CRITICAL RESULT CALLED TO, READ BACK BY AND VERIFIED WITH Whitman Hero, RN. [phone] 02/27/23. LPAIT    Chloride 98 98 - 111 mmol/L   CO2 22 22 - 32 mmol/L   Glucose, Bld 76 70 - 99 mg/dL    Comment: Glucose reference range applies only to samples taken after fasting for at least 8 hours.   BUN 83 (H) 8 - 23 mg/dL   Creatinine, Ser 40.98 (H) 0.44 - 1.00 mg/dL   Calcium 9.7 8.9 - 11.9 mg/dL    GFR, Estimated 3 (L) >60 mL/min    Comment: (NOTE) Calculated using the CKD-EPI Creatinine Equation (2021)    Anion gap 18 (H) 5 - 15    Comment: Performed at [facility], [address], [place], [place] [address]  Type and screen [facility]     Status: None   Collection Time: 02/26/23 10:02 PM  Result Value Ref Range   ABO/RH(D) B POS    Antibody Screen NEG    Sample Expiration      03/01/2023,2359 Performed at [facility], [address], [place], [place] [address]     Chest Portable 1 View  Result Date: 02/26/2023 CLINICAL DATA:  Preop hip fracture EXAM: PORTABLE CHEST 1 VIEW COMPARISON:  Chest x-ray 02/25/2023 FINDINGS: Heart is enlarged. There central pulmonary vascular congestion. There is no focal lung infiltrate, pleural effusion or pneumothorax. Vascular stent is seen in the left arm. There surgical clips in the right abdomen. No acute fractures. IMPRESSION: Cardiomegaly with central pulmonary vascular congestion. Electronically Signed   By: Darliss Cheney M.D.   On: 02/26/2023 21:11    ROS: Review of system limited because of her confusion.  Patient looks clinically stable and comfortable. Blood pressure (!) 141/76, pulse 87, temperature 98.1 F (36.7 C), temperature source Oral, resp. rate 16, height 5\' 4"  (1.626 m), weight 49.4 kg, SpO2 96 %. Gen: NAD, comfortable Respiratory: Clear bilateral, no wheezing or crackle Cardiovascular: Regular rate rhythm S1-S2 normal, no rubs GI: Abdomen soft, nontender, nondistended Extremities, no cyanosis or clubbing, no edema Skin: No rash or ulcer Neurology: Alert, awake, confused female Dialysis Access: AV fistula with good thrill and bruit.  Assessment/Plan:  # Left hip fracture after the fall.  Ortho team is already following and plan for surgical intervention.  # ESRD: MWF.  Last HD unknown.  Receiving dialysis this morning.  AV fistula for the access.  She is tolerating dialysis well without any  issues.  # Hypertension: Mildly elevated BP before starting dialysis.  Volume acceptable.  Blood pressure expect to improve after HD.  I noted midodrine is ordered which may not need if her blood pressure remains elevated.  # Anemia of ESRD: Hemoglobin at goal.  Unknown last ESA.  # Metabolic Bone Disease: Currently on sevelamer when able to take orally.  Monitor labs.  # Hyperkalemia: Managing with HD.  Thank you for the consult.  Discussed with HD nurses.  Tressa Maldonado Jaynie Collins 02/27/2023, 6:52 AM

## 2023-02-28 ENCOUNTER — Encounter (HOSPITAL_COMMUNITY): Admission: AD | Disposition: E | Payer: Self-pay | Source: Other Acute Inpatient Hospital | Attending: Internal Medicine

## 2023-02-28 ENCOUNTER — Inpatient Hospital Stay (HOSPITAL_COMMUNITY): Payer: Medicare Other | Admitting: Anesthesiology

## 2023-02-28 ENCOUNTER — Encounter (HOSPITAL_COMMUNITY): Payer: Self-pay | Admitting: Internal Medicine

## 2023-02-28 ENCOUNTER — Inpatient Hospital Stay (HOSPITAL_COMMUNITY): Payer: Medicare Other

## 2023-02-28 ENCOUNTER — Ambulatory Visit: Payer: Medicare Other | Admitting: Internal Medicine

## 2023-02-28 DIAGNOSIS — N186 End stage renal disease: Secondary | ICD-10-CM

## 2023-02-28 DIAGNOSIS — E440 Moderate protein-calorie malnutrition: Secondary | ICD-10-CM | POA: Diagnosis not present

## 2023-02-28 DIAGNOSIS — D631 Anemia in chronic kidney disease: Secondary | ICD-10-CM | POA: Diagnosis not present

## 2023-02-28 DIAGNOSIS — F250 Schizoaffective disorder, bipolar type: Secondary | ICD-10-CM | POA: Diagnosis not present

## 2023-02-28 DIAGNOSIS — Z992 Dependence on renal dialysis: Secondary | ICD-10-CM

## 2023-02-28 DIAGNOSIS — S72002D Fracture of unspecified part of neck of left femur, subsequent encounter for closed fracture with routine healing: Secondary | ICD-10-CM | POA: Diagnosis not present

## 2023-02-28 DIAGNOSIS — S72002A Fracture of unspecified part of neck of left femur, initial encounter for closed fracture: Secondary | ICD-10-CM

## 2023-02-28 DIAGNOSIS — I252 Old myocardial infarction: Secondary | ICD-10-CM | POA: Diagnosis not present

## 2023-02-28 HISTORY — PX: HIP ARTHROPLASTY: SHX981

## 2023-02-28 LAB — RENAL FUNCTION PANEL
Albumin: 3 g/dL — ABNORMAL LOW (ref 3.5–5.0)
Anion gap: 17 — ABNORMAL HIGH (ref 5–15)
BUN: 46 mg/dL — ABNORMAL HIGH (ref 8–23)
CO2: 23 mmol/L (ref 22–32)
Calcium: 9.3 mg/dL (ref 8.9–10.3)
Chloride: 93 mmol/L — ABNORMAL LOW (ref 98–111)
Creatinine, Ser: 7.79 mg/dL — ABNORMAL HIGH (ref 0.44–1.00)
GFR, Estimated: 5 mL/min — ABNORMAL LOW (ref >60)
Glucose, Bld: 150 mg/dL — ABNORMAL HIGH (ref 70–99)
Phosphorus: 7.5 mg/dL — ABNORMAL HIGH (ref 2.5–4.6)
Potassium: 5.6 mmol/L — ABNORMAL HIGH (ref 3.5–5.1)
Sodium: 133 mmol/L — ABNORMAL LOW (ref 135–145)

## 2023-02-28 LAB — POCT I-STAT, CHEM 8
BUN: 43 mg/dL — ABNORMAL HIGH (ref 8–23)
Calcium, Ion: 1.17 mmol/L (ref 1.15–1.40)
Chloride: 96 mmol/L — ABNORMAL LOW (ref 98–111)
Creatinine, Ser: 7.4 mg/dL — ABNORMAL HIGH (ref 0.44–1.00)
Glucose, Bld: 96 mg/dL (ref 70–99)
HCT: 34 % — ABNORMAL LOW (ref 36.0–46.0)
Hemoglobin: 11.6 g/dL — ABNORMAL LOW (ref 12.0–15.0)
Potassium: 5 mmol/L (ref 3.5–5.1)
Sodium: 133 mmol/L — ABNORMAL LOW (ref 135–145)
TCO2: 28 mmol/L (ref 22–32)

## 2023-02-28 LAB — SURGICAL PCR SCREEN
MRSA, PCR: NEGATIVE
Staphylococcus aureus: NEGATIVE

## 2023-02-28 LAB — HEPATITIS B SURFACE ANTIBODY, QUANTITATIVE: Hep B S AB Quant (Post): 23 m[IU]/mL (ref >9.9)

## 2023-02-28 LAB — ABO/RH: ABO/RH(D): B POS

## 2023-02-28 SURGERY — HEMIARTHROPLASTY, HIP, DIRECT ANTERIOR APPROACH, FOR FRACTURE
Anesthesia: General | Site: Hip | Laterality: Left

## 2023-02-28 MED ORDER — CHLORHEXIDINE GLUCONATE 0.12 % MT SOLN
OROMUCOSAL | Status: AC
  Filled 2023-02-28: qty 15

## 2023-02-28 MED ORDER — LIDOCAINE 2% (20 MG/ML) 5 ML SYRINGE
INTRAMUSCULAR | Status: DC | PRN
  Administered 2023-02-28: 40 mg via INTRAVENOUS

## 2023-02-28 MED ORDER — CEFAZOLIN SODIUM-DEXTROSE 2-4 GM/100ML-% IV SOLN
2.0000 g | INTRAVENOUS | Status: AC
  Administered 2023-02-28: 2 g via INTRAVENOUS
  Filled 2023-02-28: qty 100

## 2023-02-28 MED ORDER — CHLORHEXIDINE GLUCONATE 0.12 % MT SOLN: 15.0000 mL | Freq: Once | OROMUCOSAL | Status: DC

## 2023-02-28 MED ORDER — FENTANYL CITRATE (PF) 100 MCG/2ML IJ SOLN: 25.0000 ug | INTRAMUSCULAR | Status: DC | PRN

## 2023-02-28 MED ORDER — HEPARIN SODIUM (PORCINE) 5000 UNIT/ML IJ SOLN
5000.0000 [IU] | Freq: Three times a day (TID) | INTRAMUSCULAR | Status: DC
  Administered 2023-02-28 – 2023-03-10 (×29): 5000 [IU] via SUBCUTANEOUS
  Filled 2023-02-28 (×29): qty 1

## 2023-02-28 MED ORDER — ORAL CARE MOUTH RINSE: 15.0000 mL | Freq: Once | OROMUCOSAL | Status: DC

## 2023-02-28 MED ORDER — VANCOMYCIN HCL 1000 MG IV SOLR
INTRAVENOUS | Status: AC
  Filled 2023-02-28: qty 20

## 2023-02-28 MED ORDER — NEPRO/CARBSTEADY PO LIQD
237.0000 mL | Freq: Two times a day (BID) | ORAL | Status: DC
  Administered 2023-02-28 – 2023-03-06 (×8): 237 mL via ORAL

## 2023-02-28 MED ORDER — ONDANSETRON HCL 4 MG/2ML IJ SOLN
INTRAMUSCULAR | Status: DC | PRN
  Administered 2023-02-28: 4 mg via INTRAVENOUS

## 2023-02-28 MED ORDER — SODIUM CHLORIDE 0.9 % IR SOLN
Status: DC | PRN
  Administered 2023-02-28: 1000 mL

## 2023-02-28 MED ORDER — ROCURONIUM BROMIDE 10 MG/ML (PF) SYRINGE
PREFILLED_SYRINGE | INTRAVENOUS | Status: DC | PRN
  Administered 2023-02-28: 40 mg via INTRAVENOUS
  Administered 2023-02-28: 20 mg via INTRAVENOUS

## 2023-02-28 MED ORDER — PHENYLEPHRINE HCL-NACL 20-0.9 MG/250ML-% IV SOLN
INTRAVENOUS | Status: DC | PRN
  Administered 2023-02-28: 50 ug/min via INTRAVENOUS

## 2023-02-28 MED ORDER — ACETAMINOPHEN 10 MG/ML IV SOLN: 1000.0000 mg | Freq: Once | INTRAVENOUS | Status: DC | PRN

## 2023-02-28 MED ORDER — MUPIROCIN 2 % EX OINT
1.0000 | TOPICAL_OINTMENT | Freq: Two times a day (BID) | CUTANEOUS | 0 refills | Status: DC
Start: 2023-02-28 — End: 2023-03-26

## 2023-02-28 MED ORDER — CEFAZOLIN SODIUM-DEXTROSE 2-4 GM/100ML-% IV SOLN: 2.0000 g | Freq: Four times a day (QID) | INTRAVENOUS | Status: DC

## 2023-02-28 MED ORDER — ONDANSETRON HCL 4 MG PO TABS: 4.0000 mg | ORAL_TABLET | Freq: Four times a day (QID) | ORAL | Status: DC | PRN

## 2023-02-28 MED ORDER — VANCOMYCIN HCL IN DEXTROSE 1-5 GM/200ML-% IV SOLN
1000.0000 mg | INTRAVENOUS | Status: AC
  Administered 2023-02-28: 1000 mg via INTRAVENOUS
  Filled 2023-02-28: qty 200

## 2023-02-28 MED ORDER — POVIDONE-IODINE 10 % EX SWAB
2.0000 | Freq: Once | CUTANEOUS | Status: AC
  Administered 2023-02-28: 2 via TOPICAL

## 2023-02-28 MED ORDER — CHLORHEXIDINE GLUCONATE 4 % EX SOLN: 1.0000 | CUTANEOUS | 1 refills | Status: DC

## 2023-02-28 MED ORDER — RENA-VITE PO TABS
1.0000 | ORAL_TABLET | Freq: Every day | ORAL | Status: DC
  Administered 2023-02-28 – 2023-03-23 (×20): 1 via ORAL
  Filled 2023-02-28 (×21): qty 1

## 2023-02-28 MED ORDER — ONDANSETRON HCL 4 MG/2ML IJ SOLN: 4.0000 mg | Freq: Four times a day (QID) | INTRAMUSCULAR | Status: DC | PRN

## 2023-02-28 MED ORDER — CHLORHEXIDINE GLUCONATE 4 % EX SOLN: 60.0000 mL | Freq: Once | CUTANEOUS | Status: DC

## 2023-02-28 MED ORDER — VANCOMYCIN HCL 1000 MG IV SOLR
INTRAVENOUS | Status: DC | PRN
  Administered 2023-02-28: 1000 mg via TOPICAL

## 2023-02-28 MED ORDER — METOCLOPRAMIDE HCL 5 MG/ML IJ SOLN: 5.0000 mg | Freq: Three times a day (TID) | INTRAMUSCULAR | Status: DC | PRN

## 2023-02-28 MED ORDER — METOCLOPRAMIDE HCL 5 MG PO TABS: 5.0000 mg | ORAL_TABLET | Freq: Three times a day (TID) | ORAL | Status: DC | PRN

## 2023-02-28 MED ORDER — PROPOFOL 10 MG/ML IV BOLUS
INTRAVENOUS | Status: AC
  Filled 2023-02-28: qty 20

## 2023-02-28 MED ORDER — TRANEXAMIC ACID-NACL 1000-0.7 MG/100ML-% IV SOLN
1000.0000 mg | INTRAVENOUS | Status: AC
  Administered 2023-02-28: 1000 mg via INTRAVENOUS

## 2023-02-28 MED ORDER — FENTANYL CITRATE (PF) 250 MCG/5ML IJ SOLN
INTRAMUSCULAR | Status: DC | PRN
  Administered 2023-02-28: 25 ug via INTRAVENOUS
  Administered 2023-02-28 (×2): 50 ug via INTRAVENOUS
  Administered 2023-02-28: 25 ug via INTRAVENOUS
  Administered 2023-02-28: 50 ug via INTRAVENOUS

## 2023-02-28 MED ORDER — ACETAMINOPHEN 500 MG PO TABS
ORAL_TABLET | ORAL | Status: AC
  Filled 2023-02-28: qty 2

## 2023-02-28 MED ORDER — TRANEXAMIC ACID-NACL 1000-0.7 MG/100ML-% IV SOLN
INTRAVENOUS | Status: AC
  Filled 2023-02-28: qty 100

## 2023-02-28 MED ORDER — SODIUM CHLORIDE 0.9 % IV SOLN: INTRAVENOUS | Status: DC

## 2023-02-28 MED ORDER — OXYCODONE HCL 5 MG/5ML PO SOLN: 5.0000 mg | Freq: Once | ORAL | Status: DC | PRN

## 2023-02-28 MED ORDER — CHLORHEXIDINE GLUCONATE CLOTH 2 % EX PADS
6.0000 | MEDICATED_PAD | Freq: Every day | CUTANEOUS | Status: DC
  Administered 2023-03-01: 6 via TOPICAL

## 2023-02-28 MED ORDER — PROPOFOL 10 MG/ML IV BOLUS
INTRAVENOUS | Status: DC | PRN
  Administered 2023-02-28: 40 mg via INTRAVENOUS
  Administered 2023-02-28: 60 mg via INTRAVENOUS
  Administered 2023-02-28: 40 mg via INTRAVENOUS

## 2023-02-28 MED ORDER — DEXAMETHASONE SODIUM PHOSPHATE 10 MG/ML IJ SOLN
INTRAMUSCULAR | Status: DC | PRN
  Administered 2023-02-28: 5 mg via INTRAVENOUS

## 2023-02-28 MED ORDER — OXYCODONE HCL 5 MG PO TABS: 5.0000 mg | ORAL_TABLET | Freq: Once | ORAL | Status: DC | PRN

## 2023-02-28 MED ORDER — MENTHOL 3 MG MT LOZG: 1.0000 | LOZENGE | OROMUCOSAL | Status: DC | PRN

## 2023-02-28 MED ORDER — AMISULPRIDE (ANTIEMETIC) 5 MG/2ML IV SOLN: 10.0000 mg | Freq: Once | INTRAVENOUS | Status: DC | PRN

## 2023-02-28 MED ORDER — FENTANYL CITRATE (PF) 250 MCG/5ML IJ SOLN
INTRAMUSCULAR | Status: AC
  Filled 2023-02-28: qty 5

## 2023-02-28 MED ORDER — SUGAMMADEX SODIUM 200 MG/2ML IV SOLN
INTRAVENOUS | Status: DC | PRN
  Administered 2023-02-28: 100 mg via INTRAVENOUS

## 2023-02-28 MED ORDER — DOCUSATE SODIUM 100 MG PO CAPS
100.0000 mg | ORAL_CAPSULE | Freq: Two times a day (BID) | ORAL | Status: DC
  Administered 2023-03-01 – 2023-03-10 (×17): 100 mg via ORAL
  Filled 2023-02-28 (×21): qty 1

## 2023-02-28 MED ORDER — PHENOL 1.4 % MT LIQD: 1.0000 | OROMUCOSAL | Status: DC | PRN

## 2023-02-28 MED ORDER — 0.9 % SODIUM CHLORIDE (POUR BTL) OPTIME
TOPICAL | Status: DC | PRN
  Administered 2023-02-28: 1000 mL

## 2023-02-28 MED ORDER — PHENYLEPHRINE 80 MCG/ML (10ML) SYRINGE FOR IV PUSH (FOR BLOOD PRESSURE SUPPORT)
PREFILLED_SYRINGE | INTRAVENOUS | Status: DC | PRN
  Administered 2023-02-28: 160 ug via INTRAVENOUS

## 2023-02-28 SURGICAL SUPPLY — 53 items
BAG COUNTER SPONGE SURGICOUNT (BAG) ×1 IMPLANT
BAG SPNG CNTER NS LX DISP (BAG) ×1
BLADE SAW SGTL 73X25 THK (BLADE) ×1 IMPLANT
BRUSH SCRUB EZ PLAIN DRY (MISCELLANEOUS) ×2 IMPLANT
COVER SURGICAL LIGHT HANDLE (MISCELLANEOUS) ×1 IMPLANT
DRAPE INCISE IOBAN 85X60 (DRAPES) ×1 IMPLANT
DRAPE ORTHO SPLIT 77X108 STRL (DRAPES) ×2
DRAPE SURG ORHT 6 SPLT 77X108 (DRAPES) ×2 IMPLANT
DRAPE U-SHAPE 47X51 STRL (DRAPES) ×1 IMPLANT
DRSG MEPILEX POST OP 4X8 (GAUZE/BANDAGES/DRESSINGS) ×1 IMPLANT
ELECT BLADE 6.5 EXT (BLADE) IMPLANT
ELECT CAUTERY BLADE 6.4 (BLADE) IMPLANT
ELECT REM PT RETURN 9FT ADLT (ELECTROSURGICAL) ×1
ELECTRODE REM PT RTRN 9FT ADLT (ELECTROSURGICAL) ×1 IMPLANT
GLOVE BIO SURGEON STRL SZ7.5 (GLOVE) ×1 IMPLANT
GLOVE BIO SURGEON STRL SZ8 (GLOVE) ×1 IMPLANT
GLOVE BIOGEL PI IND STRL 8 (GLOVE) ×2 IMPLANT
GLOVE SURG ORTHO LTX SZ7.5 (GLOVE) ×2 IMPLANT
GOWN STRL REUS W/ TWL LRG LVL3 (GOWN DISPOSABLE) ×1 IMPLANT
GOWN STRL REUS W/ TWL XL LVL3 (GOWN DISPOSABLE) ×1 IMPLANT
GOWN STRL REUS W/TWL LRG LVL3 (GOWN DISPOSABLE) ×1
GOWN STRL REUS W/TWL XL LVL3 (GOWN DISPOSABLE) ×1
HANDPIECE INTERPULSE COAX TIP (DISPOSABLE) ×1
HEAD FEM UNIPOLAR 44 OD STRL (Hips) IMPLANT
IMMOBILIZER KNEE 22 UNIV (SOFTGOODS) IMPLANT
KIT BASIN OR (CUSTOM PROCEDURE TRAY) ×1 IMPLANT
KIT TURNOVER KIT B (KITS) ×1 IMPLANT
MANIFOLD NEPTUNE II (INSTRUMENTS) ×1 IMPLANT
NDL 1/2 CIR MAYO (NEEDLE) IMPLANT
NEEDLE 1/2 CIR MAYO (NEEDLE) IMPLANT
NS IRRIG 1000ML POUR BTL (IV SOLUTION) ×1 IMPLANT
PACK TOTAL JOINT (CUSTOM PROCEDURE TRAY) ×1 IMPLANT
PAD ARMBOARD 7.5X6 YLW CONV (MISCELLANEOUS) ×2 IMPLANT
PILLOW ABDUCTION MEDIUM (MISCELLANEOUS) IMPLANT
RETRIEVER SUT HEWSON (MISCELLANEOUS) ×1 IMPLANT
SET HNDPC FAN SPRY TIP SCT (DISPOSABLE) IMPLANT
SPACER FEM TAPERED +0 12/14 (Hips) IMPLANT
STAPLER VISISTAT 35W (STAPLE) ×1 IMPLANT
STEM DUOFIX STD SZ 3 (Stem) IMPLANT
STEM SUMMIT BASIC PRESSFIT SZ3 (Hips) IMPLANT
SUMMIT BASIC PRESSFIT SZ3 (Hips) IMPLANT
SUT ETHILON 2 0 PSLX (SUTURE) ×2 IMPLANT
SUT FIBERWIRE #2 38 T-5 BLUE (SUTURE) ×2
SUT VIC AB 1 CT1 18XCR BRD 8 (SUTURE) ×1 IMPLANT
SUT VIC AB 1 CT1 27 (SUTURE) ×2
SUT VIC AB 1 CT1 27XBRD ANBCTR (SUTURE) ×2 IMPLANT
SUT VIC AB 1 CT1 8-18 (SUTURE) ×1
SUT VIC AB 2-0 CT1 27 (SUTURE) ×2
SUT VIC AB 2-0 CT1 TAPERPNT 27 (SUTURE) ×2 IMPLANT
SUTURE FIBERWR #2 38 T-5 BLUE (SUTURE) ×2 IMPLANT
TOWEL GREEN STERILE (TOWEL DISPOSABLE) ×1 IMPLANT
TOWEL GREEN STERILE FF (TOWEL DISPOSABLE) ×1 IMPLANT
WATER STERILE IRR 1000ML POUR (IV SOLUTION) ×1 IMPLANT

## 2023-02-28 NOTE — Progress Notes (Signed)
Pt. Is off floor to O.R

## 2023-02-28 NOTE — Op Note (Signed)
02/28/2023  12:32 PM  PATIENT:  Madison [name]  [birth year] female   MEDICAL RECORD NUMBER: [id number]  PRE-OPERATIVE DIAGNOSIS:  DISPLACED LEFT FEMORAL NECK FRACTURE  POST-OPERATIVE DIAGNOSIS:  DISPLACED LEFT FEMORAL NECK FRACTURE  PROCEDURE:  Procedure(s): UNIPOLAR HEMIARTHROPLASTY OF THE LEFT HIP with [place] [employer] #3 femoral stem, standard neck, 44 mm head  SURGEON:  Surgeon(s) and Role:    Myrene Galas, MD - Primary  PHYSICIAN ASSISTANT: Montez Morita, PA-C  ANESTHESIA:   general  EBL:  100 mL   BLOOD ADMINISTERED:none  DRAINS: none   LOCAL MEDICATIONS USED:  NONE  SPECIMEN:  No Specimen  DISPOSITION OF SPECIMEN:  N/A  COUNTS:  YES  TOURNIQUET:  * No tourniquets in log *  DICTATION: Note written in EPIC  PLAN OF CARE: Admit to inpatient   PATIENT DISPOSITION:  PACU - hemodynamically stable.   Delay start of Pharmacological VTE agent (>24hrs) due to surgical blood loss or risk of bleeding: no  BRIEF SUMMARY OF INDICATION FOR PROCEDURE:  Madison [name] is a very pleasant 72 y.o. with ESRD and schizophrenia, bipolar disease, who sustained an unwitnessed fall producing inability to bear weight, shortening, and external rotation of the extremity.  She was seen and evaluated with the recommendation for hemiarthroplasty. I discussed with the patient and sister the risks and benefits, inclding the potential for leg length inequality, dislocation or instability, arthritis, loss of motion, DVT, PE, heart attack, stroke, and death.  Consent was given to proceed.  BRIEF SUMMARY OF PROCEDURE:  The patient was taken to the operating room where general anesthesia was induced and after administration of preoperative antibiotics consisting of 2 g of Ancef.  She was positioned with the left side up and all prominences were padded appropriately.  We made a 10 cm incision after the time-out, carrying dissection down to the IT band, was split in line with the skin.   Cerebellar retractor was placed and we were able to then flex and internally rotate the hip releasing the piriformis and short rotators at their insertions.  The capsule was then T'd, tagging the corners with #1 Vicryl.  The neck cut was refined using a cutting guide and then this was followed by removal of the head, which sized perfectly to 44 mm. Acetabular trials were placed, confirming this size as the best fit. Mueller and Cobra retractors were placed along the proximal femur, which was then prepared with the canal finder,  then lateralizer, followed by reamers up to #3, and the broaches, achieving  outstanding fit and fill with the #3 broach.  The calcar reamer was used to refine the cut.  The canal was irrigated thoroughly and the acetabulum once again searched multiple times for fragments and irrigated thoroughly.  Trial components were placed and the patient had outstanding stability in combine 90 degrees of flexion, adduction, and internal rotation as well as in external rotation and extension.  Consequently, actual components were placed.  My assistant Montez Morita, was necessary for delivery and control of the proximal femur during preparation, also during relocation and dislocation of the trial components as well as relocation of the actual components.  He assisted me with wound closure as well.  I did repair the capsule with #1 Vicryl and then used #2 FiberWire through bone tunnels to repair the short rotators and piriformis.  This was followed by a #1 Vicryl for the IT band and lastly 2-0 Vicryl and nylon for the subcutaneous and skin.  Sterile gently compressive dressing was applied.  The patient was awakened from anesthesia and transported to the PACU in stable condition.  PROGNOSIS:  The patient will be weightbearing as tolerated with posterior hip precautions.  Patient has an elevated risk of complications related to declining overall health and mobility.  Madison [name]  remains on the Medical Service and will be on DVT prophylaxis mechanically and with Lovenox while in the hospital, but may not be best candidate for long-term prophylaxis given dialysis and the psychological concerns/ potential difficulty following examination, which has been discussed family as well. Final recs to follow.     Madison [name]. Madison [name], M.D.

## 2023-02-28 NOTE — Transfer of Care (Signed)
Immediate Anesthesia Transfer of Care Note  Patient: Madison [name]  Procedure(s) Performed: ARTHROPLASTY BIPOLAR HIP (HEMIARTHROPLASTY) (Left: Hip)  Patient Location: PACU  Anesthesia Type:General  Level of Consciousness: sedated  Airway & Oxygen Therapy: Patient Spontanous Breathing and Patient connected to face mask oxygen  Post-op Assessment: Report given to RN and Post -op Vital signs reviewed and stable  Post vital signs: Reviewed and stable  Last Vitals:  Vitals Value Taken Time  BP 150/76 02/28/23 1230  Temp    Pulse 82 02/28/23 1231  Resp 14 02/28/23 1231  SpO2 100 % 02/28/23 1231  Vitals shown include unvalidated device data.  Last Pain:  Vitals:   02/28/23 0750  TempSrc: Oral  PainSc:          Complications: No notable events documented.

## 2023-02-28 NOTE — Progress Notes (Signed)
Initial Nutrition Assessment  DOCUMENTATION CODES:   Non-severe (moderate) malnutrition in context of chronic illness, Underweight  INTERVENTION:  Liberalize diet to Regular for widest variety of menu options  Nepro Shake po BID, each supplement provides 425 kcal and 19 grams protein Renal MVI with minerals daily  NUTRITION DIAGNOSIS:   Moderate Malnutrition related to chronic illness (ESRD, schizoaffective disorder) as evidenced by moderate fat depletion, severe fat depletion, moderate muscle depletion.  GOAL:   Patient will meet greater than or equal to 90% of their needs  MONITOR:   PO intake, Supplement acceptance, Diet advancement, Labs, Weight trends, I & O's, Skin  REASON FOR ASSESSMENT:   Consult Hip fracture protocol  ASSESSMENT:   Pt admitted from SNF after multiple falls leading to L femoral fracture. PMH significant for ESRD on HD, HTN and schizoaffective disorder, bipolar type.   Pt was off unit yesterday for HD and is now s/p unipolar hemiarthoplasty of L hip today.   Flowsheet documentation reflects pt to be oriented x1.   Pt's sister, Meriam Sprague, present at bedside. Pt has been living at [facility]. She is provided 3 meals but often only eats small portion sizes. She tries to maintain a low sodium diet. She loves desserts. She had been consuming nutrition supplements at one time but not recently. Meriam Sprague denies any noticeable recent difficulties with chewing or swallowing.   No documented meal completions on file to review.   Last HD session (06/05): Net UF 1.8L Post HD weight 48.6 kg  Pt's sister reports that she feels that she weighs about 100 lbs. By observation, she feels that she has been losing weight.   Medications: remeron, protonix, senna, renvela  Labs: sodium 133, potassium 5.0 (wdl), BUN 43, Cr 7.40, phos 6.6 (06/05)  NUTRITION - FOCUSED PHYSICAL EXAM:  Flowsheet Row Most Recent Value  Orbital Region Moderate depletion  Upper Arm Region  Severe depletion  Thoracic and Lumbar Region Severe depletion  Buccal Region Moderate depletion  Temple Region Mild depletion  Clavicle Bone Region Severe depletion  Clavicle and Acromion Bone Region Moderate depletion  Scapular Bone Region Moderate depletion  Dorsal Hand Moderate depletion  Patellar Region Moderate depletion  [unable to assess L leg]  Anterior Thigh Region Moderate depletion  Posterior Calf Region Moderate depletion  Edema (RD Assessment) None  Hair Reviewed  Eyes Unable to assess  Mouth Unable to assess  Skin Reviewed  Nails Reviewed       Diet Order:   Diet Order             Diet regular Room service appropriate? Yes; Fluid consistency: Thin; Fluid restriction: 1200 mL Fluid  Diet effective now                   EDUCATION NEEDS:   Education needs have been addressed  Skin:  Skin Assessment: Reviewed RN Assessment  Last BM:  6/4  Height:   Ht Readings from Last 1 Encounters:  02/28/23 5\' 4"  (1.626 m)    Weight:   Wt Readings from Last 1 Encounters:  02/28/23 48.6 kg    Ideal Body Weight:  54.5 kg  BMI:  Body mass index is 18.39 kg/m.  Estimated Nutritional Needs:   Kcal:  1500-1700  Protein:  75-85g  Fluid:  1L + UOP  Drusilla Kanner, RDN, LDN Clinical Nutrition

## 2023-02-28 NOTE — Anesthesia Postprocedure Evaluation (Signed)
Anesthesia Post Note  Patient: Madison [name]  Procedure(s) Performed: ARTHROPLASTY BIPOLAR HIP (HEMIARTHROPLASTY) (Left: Hip)     Patient location during evaluation: PACU Anesthesia Type: General Level of consciousness: awake Pain management: pain level controlled Vital Signs Assessment: post-procedure vital signs reviewed and stable Respiratory status: spontaneous breathing, nonlabored ventilation and respiratory function stable Cardiovascular status: blood pressure returned to baseline and stable Postop Assessment: no apparent nausea or vomiting Anesthetic complications: no   No notable events documented.  Last Vitals:  Vitals:   02/28/23 1315 02/28/23 1339  BP: (!) 142/66 130/67  Pulse: 93 86  Resp: 15 16  Temp: 36.6 C 36.4 C  SpO2: 97% 95%    Last Pain:  Vitals:   02/28/23 1339  TempSrc: Axillary  PainSc:    Pain Goal:                   Linton Rump

## 2023-02-28 NOTE — Plan of Care (Signed)
  Problem: Education: Goal: Knowledge of General Education information will improve Description: Including pain rating scale, medication(s)/side effects and non-pharmacologic comfort measures Outcome: Progressing   Problem: Health Behavior/Discharge Planning: Goal: Ability to manage health-related needs will improve Outcome: Progressing   Problem: Clinical Measurements: Goal: Ability to maintain clinical measurements within normal limits will improve Outcome: Progressing   Problem: Coping: Goal: Level of anxiety will decrease Outcome: Progressing   Problem: Skin Integrity: Goal: Risk for impaired skin integrity will decrease Outcome: Progressing
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
## 2023-02-28 NOTE — Progress Notes (Signed)
Madison [name]  MVH:[id number] DOB: [birth year] DOA: 02/26/2023 PCP: Charlynne Pander, MD    Brief Narrative:  72 year old SNF resident with a history of ESRD, HTN, and schizophrenia versus bipolar disorder who presented to [facility] ER after a fall which occurred multiple days prior.  X-rays at the [facility] ER ER revealed a left femoral fracture.  The patient was transferred to [facility] as she needed a combination of an orthopedist as well as access to inpatient dialysis.  Consultants:  Orthopedic Surgery  Goals of Care:  Code Status: Full Code   DVT prophylaxis: Subcutaneous heparin  Interim Hx: No acute events recorded overnight.  Afebrile.  Some mild sinus tachycardia.  Blood pressure stable.  The patient is resting comfortably in bed status post Orthopedic Surgery today.  She is pleasant and does not appear to be uncomfortable.  She is eating lunch with assistance from her sister.  Assessment & Plan:  Left femoral neck fracture Care per Orthopedic Surgery - s/p unipolar hemiarthroplasty of the left hip 02/28/2023  Schizoaffective disorder, bipolar type Continue usual home medical regimen -appears well compensated at present  ESRD on HD MWF Nephrology attending to dialysis  Malnutrition of moderate degree  related to chronic illness (ESRD, schizoaffective disorder) as evidenced by moderate fat depletion, severe fat depletion, moderate muscle depletion   Family Communication: Spoke with sister at bedside Disposition: Anticipate return to SNF after cleared from orthopedic standpoint   Objective: Blood pressure 130/67, pulse 86, temperature 97.6 F (36.4 C), temperature source Axillary, resp. rate 16, height 5\' 4"  (1.626 m), weight 48.6 kg, SpO2 95 %.  Intake/Output Summary (Last 24 hours) at 02/28/2023 1638 Last data filed at 02/28/2023 1500 Gross per 24 hour  Intake 600 ml  Output 200 ml  Net 400 ml   Filed Weights   02/27/23 0300 02/27/23 1336 02/28/23 0750  Weight: 49.4 kg  48.6 kg 48.6 kg    Examination: General: No acute respiratory distress Lungs: Clear to auscultation bilaterally  Cardiovascular: Regular rate and rhythm without murmur Abdomen: Nontender, nondistended, soft, bowel sounds positive Extremities: No significant edema bilateral LE  CBC: Recent Labs  Lab 02/26/23 2044 02/27/23 0650 02/28/23 0758  WBC 7.1 5.8  --   HGB 10.8* 9.7* 11.6*  HCT 33.9* 30.9* 34.0*  MCV 95.8 94.8  --   PLT 299 279  --    Basic Metabolic Panel: Recent Labs  Lab 02/26/23 2044 02/27/23 0650 02/28/23 0758  NA 138 136 133*  K 6.4* 5.4* 5.0  CL 98 98 96*  CO2 22 18*  --   GLUCOSE 76 68* 96  BUN 83* 83* 43*  CREATININE 12.35* 11.90* 7.40*  CALCIUM 9.7 8.6*  --   PHOS  --  6.6*  --    GFR: Estimated Creatinine Clearance: 5.2 mL/min (A) (by C-G formula based on SCr of 7.4 mg/dL (H)).   Scheduled Meds:  acetaminophen  650 mg Oral TID   benztropine  0.5 mg Oral BID   Chlorhexidine Gluconate Cloth  6 each Topical Q0600   Chlorhexidine Gluconate Cloth  6 each Topical Q0600   docusate sodium  100 mg Oral BID   feeding supplement (NEPRO CARB STEADY)  237 mL Oral BID BM   heparin  5,000 Units Subcutaneous Q8H   midodrine  15 mg Oral BID WC   mirtazapine  15 mg Oral QHS   multivitamin  1 tablet Oral QHS   pantoprazole  40 mg Oral Daily   senna  1  tablet Oral BID   sevelamer carbonate  800 mg Oral TID WC   valproic acid  250 mg Oral TID WC     LOS: 2 days   Lonia Blood, MD [facility] Office  [phone] Pager - Text Page per Loretha Stapler  If 7PM-7AM, please contact night-coverage per Amion 02/28/2023, 4:38 PM

## 2023-02-28 NOTE — Anesthesia Procedure Notes (Signed)
Procedure Name: Intubation Date/Time: 02/28/2023 10:00 AM  Performed by: Loleta Apollo Timothy, CRNAPre-anesthesia Checklist: Patient identified, Patient being monitored, Timeout performed, Emergency Drugs available and Suction available Patient Re-evaluated:Patient Re-evaluated prior to induction Oxygen Delivery Method: Circle system utilized Preoxygenation: Pre-oxygenation with 100% oxygen Induction Type: IV induction Ventilation: Mask ventilation without difficulty Laryngoscope Size: Mac, Hyacinth Meeker and 2 Grade View: Grade II Tube type: Oral Tube size: 6.5 mm Number of attempts: 1 Airway Equipment and Method: Stylet Placement Confirmation: ETT inserted through vocal cords under direct vision, positive ETCO2 and breath sounds checked- equal and bilateral Secured at: 21 cm Tube secured with: Tape Dental Injury: Teeth and Oropharynx as per pre-operative assessment

## 2023-02-28 NOTE — Progress Notes (Signed)
Was not successful in giving evening medication, Pt. Would hold pills in mouth, clinch teeth and refuse to swallow. MD notified

## 2023-02-28 NOTE — Progress Notes (Signed)
Brief nephrology note:  Patient is in OR this morning therefore unable to seen and examined.  I have reviewed the chart and lab results. She received dialysis yesterday with 1.8 L UF.  Hyperkalemia improved.  We will plan for dialysis tomorrow as per MWF schedule.  No heparin.  Eddie North,  [facility].

## 2023-03-01 DIAGNOSIS — E440 Moderate protein-calorie malnutrition: Secondary | ICD-10-CM | POA: Diagnosis not present

## 2023-03-01 DIAGNOSIS — N186 End stage renal disease: Secondary | ICD-10-CM | POA: Diagnosis not present

## 2023-03-01 DIAGNOSIS — S72002D Fracture of unspecified part of neck of left femur, subsequent encounter for closed fracture with routine healing: Secondary | ICD-10-CM | POA: Diagnosis not present

## 2023-03-01 DIAGNOSIS — F250 Schizoaffective disorder, bipolar type: Secondary | ICD-10-CM | POA: Diagnosis not present

## 2023-03-01 LAB — RENAL FUNCTION PANEL
Albumin: 2.7 g/dL — ABNORMAL LOW (ref 3.5–5.0)
Anion gap: 19 — ABNORMAL HIGH (ref 5–15)
BUN: 61 mg/dL — ABNORMAL HIGH (ref 8–23)
CO2: 22 mmol/L (ref 22–32)
Calcium: 9.5 mg/dL (ref 8.9–10.3)
Chloride: 93 mmol/L — ABNORMAL LOW (ref 98–111)
Creatinine, Ser: 9.11 mg/dL — ABNORMAL HIGH (ref 0.44–1.00)
GFR, Estimated: 4 mL/min — ABNORMAL LOW (ref >60)
Glucose, Bld: 107 mg/dL — ABNORMAL HIGH (ref 70–99)
Phosphorus: 7.8 mg/dL — ABNORMAL HIGH (ref 2.5–4.6)
Potassium: 4.9 mmol/L (ref 3.5–5.1)
Sodium: 134 mmol/L — ABNORMAL LOW (ref 135–145)

## 2023-03-01 LAB — CBC
HCT: 28 % — ABNORMAL LOW (ref 36.0–46.0)
Hemoglobin: 9 g/dL — ABNORMAL LOW (ref 12.0–15.0)
MCH: 29.8 pg (ref 26.0–34.0)
MCHC: 32.1 g/dL (ref 30.0–36.0)
MCV: 92.7 fL (ref 80.0–100.0)
Platelets: 281 10*3/uL (ref 150–400)
RBC: 3.02 MIL/uL — ABNORMAL LOW (ref 3.87–5.11)
RDW: 19.4 % — ABNORMAL HIGH (ref 11.5–15.5)
WBC: 8.1 10*3/uL (ref 4.0–10.5)
nRBC: 0 % (ref 0.0–0.2)

## 2023-03-01 MED ORDER — HEPARIN SODIUM (PORCINE) 1000 UNIT/ML DIALYSIS: 1000.0000 [IU] | INTRAMUSCULAR | Status: DC | PRN

## 2023-03-01 MED ORDER — ANTICOAGULANT SODIUM CITRATE 4% (200MG/5ML) IV SOLN: 5.0000 mL | Status: DC | PRN

## 2023-03-01 MED ORDER — ALTEPLASE 2 MG IJ SOLR: 2.0000 mg | Freq: Once | INTRAMUSCULAR | Status: DC | PRN

## 2023-03-01 MED ORDER — LIDOCAINE HCL (PF) 1 % IJ SOLN: 5.0000 mL | INTRAMUSCULAR | Status: DC | PRN

## 2023-03-01 MED ORDER — LIDOCAINE-PRILOCAINE 2.5-2.5 % EX CREA: 1.0000 | TOPICAL_CREAM | CUTANEOUS | Status: DC | PRN

## 2023-03-01 MED ORDER — PENTAFLUOROPROP-TETRAFLUOROETH EX AERO: 1.0000 | INHALATION_SPRAY | CUTANEOUS | Status: DC | PRN

## 2023-03-01 NOTE — Progress Notes (Signed)
Attempted to reach staff at [facility] to provide an update on pt, but there was no answer. Will assist as needed.   Olivia Canter Renal Navigator [phone]

## 2023-03-01 NOTE — Progress Notes (Addendum)
ANTICOAGULATION CONSULT NOTE - Initial Consult  Pharmacy Consult for [employer] recommendations for discharge Indication: VTE prophylaxis s/p THR  No Known Allergies  Patient Measurements: Height: 5\' 4"  (162.6 cm) Weight: 49.1 kg (108 lb 3.9 oz) IBW/kg (Calculated) : 54.7  Vital Signs: Temp: 98.8 F (37.1 C) (06/07 2036) Temp Source: Oral (06/07 1426) BP: 92/57 (06/07 2036) Pulse Rate: 107 (06/07 1426)  Labs: Recent Labs    02/27/23 0650 02/28/23 0758 02/28/23 1639 03/01/23 0809  HGB 9.7* 11.6*  --  9.0*  HCT 30.9* 34.0*  --  28.0*  PLT 279  --   --  281  CREATININE 11.90* 7.40* 7.79* 9.11*    Estimated Creatinine Clearance: 4.3 mL/min (A) (by C-G formula based on SCr of 9.11 mg/dL (H)).   Medical History: Past Medical History:  Diagnosis Date   Anxiety    Bipolar disorder (HCC)    ESRD (end stage renal disease) on dialysis (HCC)    Neuropathy    Schizophrenia (HCC)     Assessment/Recommendations: 72 y.o. F s/p L hip hemiarthroplasty for hip fracture on 6/6. Currently on SQ heparin for VTE prophylaxis. PA consulting pharmacy for [facility] recommendations for d/c. Most guidelines recommends LMWH (but no in ESRD pts) or SQ heparin -> warfarin as the DOACs have not been studied extensively in this patient population. If going to SNF, she could probably stay on SQ heparin especially if only planning for 2 weeks but if planning for 4 weeks, could consider transition to warfarin vs using apixaban (although DOACs not studied extensively in this pt population). If DOAC desired, consider apixaban 2.5mg  po BID x 14-28 days. More data in ESRD patients for apixaban than rivaroxaban.   Christoper Fabian, PharmD, BCPS Please see amion for complete clinical pharmacist phone list 03/01/2023,10:17 PM

## 2023-03-01 NOTE — Progress Notes (Addendum)
[facility] Progress Note   72 y.o. female with hypertension, schizoaffective disorder, anemia, bipolar mood disorder, ESRD on HD MWF p/w fall sustaining hip fracture seen as a consultation for the management of ESRD.  She lives in nursing home and initially presented to [facility] ER after a fall.   Left hip hemiarthroplasty on 6/6.  Receives dialysis at [facility] in [place], MWF, 225 minutes, 2K, 2.5 calcium, left upper extremity AV fistula for the access.  Assessment/ Plan:   # Left hip fracture after the fall.  Ortho team is already following and plan for surgical intervention.   # ESRD: MWF.    Seen on HD, tolerating (3 sticks) and has 2hr 45 left  2K bath 2L net UF as tolerated 132/72  Continuing to have issues with dialysis cannulation and also requiring additional cannulation attempt mid treatment for high venous pressures. Will ask VIR for angiogram given this is ongoing and not just one dialysis treatment.   # Hypertension: Mildly elevated BP before starting dialysis.  Volume acceptable.  Blood pressure expect to improve after HD.  I noted midodrine is ordered which may not need if her blood pressure remains elevated.   # Anemia of ESRD: Hemoglobin at goal.  Unknown last ESA.   # Metabolic Bone Disease: Currently on sevelamer when able to take orally.  Monitor labs.   # Hyperkalemia: Managing with HD.     Subjective:   C/o that she's not being treated right but not able to be more specific. Denies f/c/n/v/sob   Objective:   BP 132/72 (BP Location: Right Arm)   Pulse (!) 116   Temp 98.4 F (36.9 C) (Oral)   Resp (!) 32   Ht 5\' 4"  (1.626 m)   Wt 50.8 kg Comment: bed  SpO2 100%   BMI 19.22 kg/m   Intake/Output Summary (Last 24 hours) at 03/01/2023 0951 Last data filed at 02/28/2023 1500 Gross per 24 hour  Intake 600 ml  Output 200 ml  Net 400 ml   Weight change: 0 kg  Physical Exam: Gen: NAD, comfortable Respiratory: Clear bilateral, no wheezing  or crackle Cardiovascular: Regular rate rhythm S1-S2 normal, no rubs GI: Abdomen soft, nontender, nondistended Extremities, no cyanosis or clubbing, no edema Skin: No rash or ulcer Neurology: Alert, awake, confused female Dialysis Access: AV fistula with good thrill and bruit, needles in place.  Imaging: DG Hip Port Unilat With Pelvis 1V Left  Result Date: 02/28/2023 CLINICAL DATA:  Postop left hip replacement. EXAM: DG HIP (WITH OR WITHOUT PELVIS) 1V PORT LEFT COMPARISON:  02/25/2023 FINDINGS: Status post left hip hemiarthroplasty. No evidence for immediate hardware complication. Gas in the overlying soft tissues is compatible with the immediate postoperative state. IMPRESSION: Status post left hip hemiarthroplasty without evidence for immediate hardware complication. Electronically Signed   By: Kennith Center M.D.   On: 02/28/2023 13:25    Labs: BMET Recent Labs  Lab 02/26/23 2044 02/27/23 0650 02/28/23 0758 02/28/23 1639 03/01/23 0809  NA 138 136 133* 133* 134*  K 6.4* 5.4* 5.0 5.6* 4.9  CL 98 98 96* 93* 93*  CO2 22 18*  --  23 22  GLUCOSE 76 68* 96 150* 107*  BUN 83* 83* 43* 46* 61*  CREATININE 12.35* 11.90* 7.40* 7.79* 9.11*  CALCIUM 9.7 8.6*  --  9.3 9.5  PHOS  --  6.6*  --  7.5* 7.8*   CBC Recent Labs  Lab 02/26/23 2044 02/27/23 0650 02/28/23 0758 03/01/23 0809  WBC 7.1  5.8  --  8.1  HGB 10.8* 9.7* 11.6* 9.0*  HCT 33.9* 30.9* 34.0* 28.0*  MCV 95.8 94.8  --  92.7  PLT 299 279  --  281    Medications:     acetaminophen  650 mg Oral TID   benztropine  0.5 mg Oral BID   Chlorhexidine Gluconate Cloth  6 each Topical Q0600   Chlorhexidine Gluconate Cloth  6 each Topical Q0600   docusate sodium  100 mg Oral BID   feeding supplement (NEPRO CARB STEADY)  237 mL Oral BID BM   heparin  5,000 Units Subcutaneous Q8H   midodrine  15 mg Oral BID WC   mirtazapine  15 mg Oral QHS   multivitamin  1 tablet Oral QHS   pantoprazole  40 mg Oral Daily   senna  1 tablet Oral  BID   sevelamer carbonate  800 mg Oral TID WC   valproic acid  250 mg Oral TID WC      Paulene Floor, MD 03/01/2023, 9:51 AM

## 2023-03-01 NOTE — Progress Notes (Signed)
PT Cancellation Note  Patient Details Name: Madison [name] MRN: [id number] DOB: 1950/12/15   Cancelled Treatment:    Reason Eval/Treat Not Completed: Patient at procedure or test/unavailable  Currently off unit for HD, will check back this afternoon for formal PT evaluation.  Berton Mount 03/01/2023, 8:04 AM

## 2023-03-01 NOTE — Evaluation (Signed)
Physical Therapy Evaluation Patient Details Name: Madison [name] MRN: [id number] DOB: [birth year] Today's Date: 03/01/2023  History of Present Illness  72 y.o. female admitted 6/4 for fall and hip pain, s/p UNIPOLAR HEMIARTHROPLASTY OF THE LEFT HIP on 6/6. PMHx: history of ESRD, HTN, and schizophrenia versus bipolar disorder.  Clinical Impression  Patient is s/p above surgery resulting in functional limitations due to the deficits listed below (see PT Problem List). Limited assessment due to confusion. Very anxious with all mobility. Cries out in pain with any movement (even without movement of LEs.) Pt able to sit EOB but required max assist to rise to edge, and total to return to supine. Tolerated EOB nearly 10 minutes and more comfortable once square with EOB. Assisted with scooting along EOB (total assist). Able to reach for rails with heavy assistance to facilitate. Repositioned with pillow block for knees. Reviewed posterior hip precautions with pt, family, and NT - handout hanging in room for reference. May need to use KI to prevent internal rotation during pericare. Patient will benefit from acute skilled PT to increase their independence and safety with mobility to facilitate discharge.        Recommendations for follow up therapy are one component of a multi-disciplinary discharge planning process, led by the attending physician.  Recommendations may be updated based on patient status, additional functional criteria and insurance authorization.  Follow Up Recommendations Can patient physically be transported by private vehicle: No     Assistance Recommended at Discharge Frequent or constant Supervision/Assistance  Patient can return home with the following  Two people to help with walking and/or transfers;Two people to help with bathing/dressing/bathroom;Assistance with cooking/housework;Assist for transportation;Help with stairs or ramp for entrance    Equipment Recommendations None  recommended by PT  Recommendations for Other Services       Functional Status Assessment Patient has had a recent decline in their functional status and demonstrates the ability to make significant improvements in function in a reasonable and predictable amount of time.     Precautions / Restrictions Precautions Precautions: Posterior Hip (Left) Precaution Booklet Issued: Yes (comment) Precaution Comments: Reviewed with pt and family Restrictions Weight Bearing Restrictions: Yes LLE Weight Bearing: Weight bearing as tolerated      Mobility  Bed Mobility Overal bed mobility: Needs Assistance Bed Mobility: Supine to Sit, Sit to Supine     Supine to sit: HOB elevated, Max assist Sit to supine: Total assist   General bed mobility comments: Pt able to move RLE to EOB with max VC and facilitatory techniques. Needs max assist to rise to EOB for trunk and LLE support. Teaful throughout, very anxious with movement, used bed pad to assist with scoot. Leans towards Rt side. Totatl assist to return to supine.    Transfers                   General transfer comment: unable    Ambulation/Gait                  Stairs            Wheelchair Mobility    Modified Rankin (Stroke Patients Only)       Balance Overall balance assessment: Needs assistance Sitting-balance support: Single extremity supported, Feet supported Sitting balance-Leahy Scale: Poor Sitting balance - Comments: Leaning heavy towards Rt initially, Progressed to single UE support braced on bed and min guard assist to sit EOB Postural control: Right lateral lean  Pertinent Vitals/Pain Pain Assessment Pain Assessment: PAINAD Breathing: normal Negative Vocalization: occasional moan/groan, low speech, negative/disapproving quality Facial Expression: smiling or inexpressive Body Language: relaxed Consolability: distracted or reassured by  voice/touch PAINAD Score: 2 Pain Intervention(s): Monitored during session, Repositioned    Home Living Family/patient expects to be discharged to:: Skilled nursing facility                   Additional Comments: Has been in SNF since April, family reports gradual decline in function since then, has not been very mobile in SNF, using w/c for mobility.    Prior Function Prior Level of Function : Needs assist             Mobility Comments: Using w/c at SNF, ind prior per family ADLs Comments: Needs assist with ADLs at SNF since admission in april     Hand Dominance   Dominant Hand: Right    Extremity/Trunk Assessment   Upper Extremity Assessment Upper Extremity Assessment: Defer to OT evaluation    Lower Extremity Assessment Lower Extremity Assessment: LLE deficits/detail;Overall [facility] for tasks assessed;Difficult to assess due to impaired cognition LLE Deficits / Details: very guarded, able to move with significant cues and assist to faciliate from therapist.       Communication   Communication: Expressive difficulties  Cognition Arousal/Alertness: Lethargic Behavior During Therapy: Flat affect Overall Cognitive Status: Difficult to assess                                 General Comments: Family feels cognition has declined past couple of months.        General Comments General comments (skin integrity, edema, etc.): Family present and supportive. Needs assist frequently to maintain hip precautions and avoid internal rotation. Pillow placed between knees in bed at end of session for enhanced positioning.    Exercises     Assessment/Plan    PT Assessment Patient needs continued PT services  PT Problem List Decreased strength;Decreased range of motion;Decreased activity tolerance;Decreased mobility;Decreased balance;Decreased coordination;Decreased knowledge of use of DME;Decreased cognition;Decreased safety awareness;Decreased knowledge of  precautions;Pain       PT Treatment Interventions DME instruction;Gait training;Functional mobility training;Therapeutic activities;Therapeutic exercise;Balance training;Neuromuscular re-education;Cognitive remediation;Patient/family education;Wheelchair mobility training;Modalities    PT Goals (Current goals can be found in the Care Plan section)  Acute Rehab PT Goals Patient Stated Goal: none stated PT Goal Formulation: With family Time For Goal Achievement: 03/15/23 Potential to Achieve Goals: Fair    Frequency Min 2X/week     Co-evaluation               AM-PAC PT "6 Clicks" Mobility  Outcome Measure Help needed turning from your back to your side while in a flat bed without using bedrails?: A Lot Help needed moving from lying on your back to sitting on the side of a flat bed without using bedrails?: Total Help needed moving to and from a bed to a chair (including a wheelchair)?: Total Help needed standing up from a chair using your arms (e.g., wheelchair or bedside chair)?: Total Help needed to walk in hospital room?: Total Help needed climbing 3-5 steps with a railing? : Total 6 Click Score: 7    End of Session Equipment Utilized During Treatment: Gait belt Activity Tolerance: Patient limited by pain;Other (comment) (confusion) Patient left: with call bell/phone within reach;in bed;with bed alarm set;with family/visitor present Nurse Communication: Need for lift equipment;Precautions;Mobility status (Discussed  bed mobility techniques and precautions with NT during pericare) PT Visit Diagnosis: Muscle weakness (generalized) (M62.81);History of falling (Z91.81);Difficulty in walking, not elsewhere classified (R26.2);Pain Pain - Right/Left: Left Pain - part of body: Hip    Time: 1415-1450 PT Time Calculation (min) (ACUTE ONLY): 35 min   Charges:   PT Evaluation $PT Eval Moderate Complexity: 1 Mod PT Treatments $Therapeutic Activity: 8-22 mins        Kathlyn Sacramento, PT, DPT [facility]  Rehabilitation Services Physical Therapist Office: [phone] Website: [email or web address]  Berton Mount 03/01/2023, 3:14 PM

## 2023-03-01 NOTE — Progress Notes (Signed)
Madison [name]  RJJ:[id number] DOB: 1950/11/23 DOA: 02/26/2023 PCP: Charlynne Pander, MD    Brief Narrative:  72 year old SNF resident with a history of ESRD, HTN, and schizophrenia versus bipolar disorder who presented to [facility] ER after a fall which occurred multiple days prior.  X-rays at the [facility] ER revealed a left femoral fracture.  The patient was transferred to [facility] as she needed a combination of an Orthopedist as well as access to inpatient dialysis.  Consultants:  Orthopedic Surgery  Goals of Care:  Code Status: Full Code   DVT prophylaxis: Subcutaneous heparin  Interim Hx: No acute events recorded overnight.  Afebrile.  Mild sinus tachycardia appreciated.  Resting comfortably in bed at the time of visit.  Extended discussion with the patient's sister at bedside.  Assessment & Plan:  Left femoral neck fracture Care per Orthopedic Surgery - s/p unipolar hemiarthroplasty of the left hip 02/28/2023 -appears to be stabilizing from an orthopedic standpoint  Schizoaffective disorder, bipolar type Continue usual home medical regimen -appears well compensated at present  ESRD on HD MWF Nephrology attending to dialysis -ongoing issues with dialysis cannulation with nephrology asking for angiogram  Anemia of chronic kidney disease compounded by postoperative anemia Hemoglobin only a slight downward trend -no indication for transfusion presently -continue to monitor  Malnutrition of moderate degree  related to chronic illness (ESRD, schizoaffective disorder) as evidenced by moderate fat depletion, severe fat depletion, moderate muscle depletion   Family Communication:  Disposition: Anticipate return to SNF after cleared from orthopedic standpoint   Objective: Blood pressure 132/72, pulse (!) 116, temperature 98.4 F (36.9 C), temperature source Oral, resp. rate (!) 32, height 5\' 4"  (1.626 m), weight 50.8 kg, SpO2 100 %.  Intake/Output Summary (Last 24 hours) at  03/01/2023 0938 Last data filed at 02/28/2023 1500 Gross per 24 hour  Intake 600 ml  Output 200 ml  Net 400 ml    Filed Weights   02/27/23 1336 02/28/23 0750 03/01/23 0758  Weight: 48.6 kg 48.6 kg 50.8 kg    Examination: General: No acute respiratory distress Lungs: Clear to auscultation bilaterally  Cardiovascular: Regular rate and rhythm without murmur Abdomen: Nontender, nondistended, soft, bowel sounds positive Extremities: No significant edema BLE  CBC: Recent Labs  Lab 02/26/23 2044 02/27/23 0650 02/28/23 0758 03/01/23 0809  WBC 7.1 5.8  --  8.1  HGB 10.8* 9.7* 11.6* 9.0*  HCT 33.9* 30.9* 34.0* 28.0*  MCV 95.8 94.8  --  92.7  PLT 299 279  --  281    Basic Metabolic Panel: Recent Labs  Lab 02/27/23 0650 02/28/23 0758 02/28/23 1639 03/01/23 0809  NA 136 133* 133* 134*  K 5.4* 5.0 5.6* 4.9  CL 98 96* 93* 93*  CO2 18*  --  23 22  GLUCOSE 68* 96 150* 107*  BUN 83* 43* 46* 61*  CREATININE 11.90* 7.40* 7.79* 9.11*  CALCIUM 8.6*  --  9.3 9.5  PHOS 6.6*  --  7.5* 7.8*    GFR: Estimated Creatinine Clearance: 4.4 mL/min (A) (by C-G formula based on SCr of 9.11 mg/dL (H)).   Scheduled Meds:  acetaminophen  650 mg Oral TID   benztropine  0.5 mg Oral BID   Chlorhexidine Gluconate Cloth  6 each Topical Q0600   Chlorhexidine Gluconate Cloth  6 each Topical Q0600   docusate sodium  100 mg Oral BID   feeding supplement (NEPRO CARB STEADY)  237 mL Oral BID BM   heparin  5,000 Units Subcutaneous Q8H  midodrine  15 mg Oral BID WC   mirtazapine  15 mg Oral QHS   multivitamin  1 tablet Oral QHS   pantoprazole  40 mg Oral Daily   senna  1 tablet Oral BID   sevelamer carbonate  800 mg Oral TID WC   valproic acid  250 mg Oral TID WC     LOS: 3 days   Lonia Blood, MD [facility] Office  [phone] Pager - Text Page per Loretha Stapler  If 7PM-7AM, please contact night-coverage per Amion 03/01/2023, 9:38 AM

## 2023-03-01 NOTE — Progress Notes (Signed)
OT Cancellation Note  Patient Details Name: Madison [name] MRN: [id number] DOB: 10/29/1950   Cancelled Treatment:    Reason Eval/Treat Not Completed: Pain limiting ability to participate;Patient's level of consciousness (Attempted to f/u with patient for therapy. Pt in intense pain and very confused, emotional during entire time OT was present in room. Pt repeatedly saying "I don't know what y'all giving me" in a tearful manner. Reassured pt she was getting pain meds) OT will follow-up with patient at later date as able.  03/01/2023  AB, OTR/L  Acute Rehabilitation Services  Office: [phone]   Tristan Schroeder 03/01/2023, 4:04 PM

## 2023-03-01 NOTE — Progress Notes (Addendum)
Orthopaedic Trauma Service Progress Note  Patient ID: VAEDA WESTALL MRN: [id number] DOB/AGE: 1951-07-26 72 y.o.  Subjective:  C/o pain left leg including ankle Just got back from dialysis   Live at snf at baseline   ROS As above  Objective:   VITALS:   Vitals:   03/01/23 1230 03/01/23 1300 03/01/23 1330 03/01/23 1345  BP: 136/80 (!) 156/77 102/66 112/63  Pulse: (!) 126 (!) 117 (!) 110 (!) 106  Resp: (!) 29 (!) 25 17 17   Temp:    97.9 F (36.6 C)  TempSrc:    Oral  SpO2: 94% 99% 100% 100%  Weight:    49.1 kg  Height:        Estimated body mass index is 18.58 kg/m as calculated from the following:   Height as of this encounter: 5\' 4"  (1.626 m).   Weight as of this encounter: 49.1 kg.   Intake/Output      06/06 0701 06/07 0700 06/07 0701 06/08 0700   P.O. 0    I.V. (mL/kg) 400 (8.2)    IV Piggyback 200    Total Intake(mL/kg) 600 (12.3)    Urine (mL/kg/hr) 0 (0)    Other  400   Blood 200    Total Output 200 400   Net +400 -400          LABS  Results for orders placed or performed during the hospital encounter of 02/26/23 (from the past 24 hour(s))  Renal function panel     Status: Abnormal   Collection Time: 02/28/23  4:39 PM  Result Value Ref Range   Sodium 133 (L) 135 - 145 mmol/L   Potassium 5.6 (H) 3.5 - 5.1 mmol/L   Chloride 93 (L) 98 - 111 mmol/L   CO2 23 22 - 32 mmol/L   Glucose, Bld 150 (H) 70 - 99 mg/dL   BUN 46 (H) 8 - 23 mg/dL   Creatinine, Ser 1.30 (H) 0.44 - 1.00 mg/dL   Calcium 9.3 8.9 - 86.5 mg/dL   Phosphorus 7.5 (H) 2.5 - 4.6 mg/dL   Albumin 3.0 (L) 3.5 - 5.0 g/dL   GFR, Estimated 5 (L) >60 mL/min   Anion gap 17 (H) 5 - 15  CBC     Status: Abnormal   Collection Time: 03/01/23  8:09 AM  Result Value Ref Range   WBC 8.1 4.0 - 10.5 K/uL   RBC 3.02 (L) 3.87 - 5.11 MIL/uL   Hemoglobin 9.0 (L) 12.0 - 15.0 g/dL   HCT 78.4 (L) 69.6 - 29.5 %   MCV 92.7 80.0 -  100.0 fL   MCH 29.8 26.0 - 34.0 pg   MCHC 32.1 30.0 - 36.0 g/dL   RDW 28.4 (H) 13.2 - 44.0 %   Platelets 281 150 - 400 K/uL   nRBC 0.0 0.0 - 0.2 %  Renal function panel     Status: Abnormal   Collection Time: 03/01/23  8:09 AM  Result Value Ref Range   Sodium 134 (L) 135 - 145 mmol/L   Potassium 4.9 3.5 - 5.1 mmol/L   Chloride 93 (L) 98 - 111 mmol/L   CO2 22 22 - 32 mmol/L   Glucose, Bld 107 (H) 70 - 99 mg/dL   BUN 61 (H) 8 - 23 mg/dL   Creatinine, Ser 1.02 (  H) 0.44 - 1.00 mg/dL   Calcium 9.5 8.9 - 16.1 mg/dL   Phosphorus 7.8 (H) 2.5 - 4.6 mg/dL   Albumin 2.7 (L) 3.5 - 5.0 g/dL   GFR, Estimated 4 (L) >60 mL/min   Anion gap 19 (H) 5 - 15     PHYSICAL EXAM:  Gen: awake, in bed, uncomfortable appearing  Ext:       Left Lower extremity   Dressing L hip clean, dry and intact  Knee immobilizer in place and was removed by me   Chronic appearing L knee effusion  Diffuse tenderness L ankle but no obvious swelling of concern  Distal motor and sensory functions grossly intact  No DCT   Compartments are soft   Assessment/Plan: 1 Day Post-Op   Active Problems:   ESRD on hemodialysis (HCC)   Schizoaffective disorder, bipolar type (HCC)   Hip fracture, left (HCC)   Malnutrition of moderate degree   Anti-infectives (From admission, onward)    Start     Dose/Rate Route Frequency Ordered Stop   02/28/23 1430  ceFAZolin (ANCEF) IVPB 2g/100 mL premix  Status:  Discontinued        2 g 200 mL/hr over 30 Minutes Intravenous Every 6 hours 02/28/23 1334 02/28/23 1344   02/28/23 1147  vancomycin (VANCOCIN) powder  Status:  Discontinued          As needed 02/28/23 1147 02/28/23 1216   02/28/23 0830  ceFAZolin (ANCEF) IVPB 2g/100 mL premix        2 g 200 mL/hr over 30 Minutes Intravenous On call to O.R. 02/28/23 0731 02/28/23 1033   02/28/23 0830  vancomycin (VANCOCIN) IVPB 1000 mg/200 mL premix        1,000 mg 200 mL/hr over 60 Minutes Intravenous On call to O.R. 02/28/23 0960  02/28/23 0858     .  POD/HD#: 1  72 y/o female s/p fall with Left femoral neck fracture s/p Left hip hemiarthroplasty   -L femoral neck fracture s/p left hip hemiarthroplasty  Weightbearing WBAT L leg with assistance   ROM/Activity   Posterior hip precautions x 12 weeks    Wound care   Daily wound care as needed starting 03/03/2023  PT and OT Ice prn  - L ankle pain  Xrays  - Pain management:  Multimodal  Minimize narcotics  - ABL anemia/Hemodynamics  Monitor  - Medical issues   Per primary   - DVT/PE prophylaxis:  Currently on sq heparin  Will consult pharmacy to verify options at [place], think xarelto should  be ok - ID:   Periop abx completed  - Metabolic Bone Disease:  Due to renal dz  - Dispo:  Therapies  Snf once cleared by therapies  Follow up with ortho 2 weeks    Mearl Latin, PA-C [phone] (C) 03/01/2023, 1:59 PM  [facility] [address] [place] [address] [phone] Collier Bullock (F)    After 5pm and on the weekends please log on to Amion, go to orthopaedics and the look under the [facility] Call for the provider(s) on call. You can also call our office at [phone] and then follow the prompts to be connected to the call team.  Patient ID: Hurman Horn, female   DOB: Feb 21, 1951, 72 y.o.   MRN: [id number]

## 2023-03-01 NOTE — Progress Notes (Signed)
OT Cancellation Note  Patient Details Name: Madison [name] MRN: [id number] DOB: 06/16/1951   Cancelled Treatment:    Reason Eval/Treat Not Completed: Patient at procedure or test/ unavailable (Pt currently at HD, OT will continue to follow-up with patient as able.)  03/01/2023  AB, OTR/L  Acute Rehabilitation Services  Office: [phone]  Madison [name] 03/01/2023, 7:47 AM

## 2023-03-02 ENCOUNTER — Inpatient Hospital Stay (HOSPITAL_COMMUNITY): Payer: Medicare Other

## 2023-03-02 ENCOUNTER — Other Ambulatory Visit: Payer: Self-pay

## 2023-03-02 ENCOUNTER — Encounter (HOSPITAL_COMMUNITY): Payer: Self-pay | Admitting: Internal Medicine

## 2023-03-02 DIAGNOSIS — F250 Schizoaffective disorder, bipolar type: Secondary | ICD-10-CM | POA: Diagnosis not present

## 2023-03-02 DIAGNOSIS — E440 Moderate protein-calorie malnutrition: Secondary | ICD-10-CM | POA: Diagnosis not present

## 2023-03-02 DIAGNOSIS — N186 End stage renal disease: Secondary | ICD-10-CM | POA: Diagnosis not present

## 2023-03-02 DIAGNOSIS — S72002D Fracture of unspecified part of neck of left femur, subsequent encounter for closed fracture with routine healing: Secondary | ICD-10-CM | POA: Diagnosis not present

## 2023-03-02 LAB — CBC
HCT: 28.1 % — ABNORMAL LOW (ref 36.0–46.0)
Hemoglobin: 9.1 g/dL — ABNORMAL LOW (ref 12.0–15.0)
MCH: 31 pg (ref 26.0–34.0)
MCHC: 32.4 g/dL (ref 30.0–36.0)
MCV: 95.6 fL (ref 80.0–100.0)
Platelets: 247 10*3/uL (ref 150–400)
RBC: 2.94 MIL/uL — ABNORMAL LOW (ref 3.87–5.11)
RDW: 19.7 % — ABNORMAL HIGH (ref 11.5–15.5)
WBC: 8.9 10*3/uL (ref 4.0–10.5)
nRBC: 0 % (ref 0.0–0.2)

## 2023-03-02 MED ORDER — HALOPERIDOL LACTATE 5 MG/ML IJ SOLN
1.0000 mg | Freq: Four times a day (QID) | INTRAMUSCULAR | Status: DC | PRN
  Administered 2023-03-02: 1 mg via INTRAVENOUS
  Filled 2023-03-02: qty 1

## 2023-03-02 NOTE — Progress Notes (Signed)
Madison [name]  FAO:[id number] DOB: 11/05/1950 DOA: 02/26/2023 PCP: Charlynne Pander, MD    Brief Narrative:  72 year old SNF resident with a history of ESRD, HTN, and schizophrenia versus bipolar disorder who presented to [facility] ER after a fall which occurred multiple days prior.  X-rays at the [facility] ER revealed a left femoral fracture.  The patient was transferred to [facility] as she needed a combination of an Orthopedist as well as access to inpatient dialysis.  Consultants:  Orthopedic Surgery  Goals of Care:  Code Status: Full Code   DVT prophylaxis: Subcutaneous heparin  Interim Hx: Somewhat anxious and agitated this morning.  Refusing medications.  Afebrile.  Vital signs stable.  In no apparent pain at the time my visit.  Assessment & Plan:  Left femoral neck fracture Care per Orthopedic Surgery - s/p unipolar hemiarthroplasty of the left hip 02/28/2023 -appears to be stabilizing from an orthopedic standpoint -cleared for discharge by orthopedics  Schizoaffective disorder, bipolar type Continue usual home medical regimen -appears well compensated at present - utilize as needed Haldol for agitation  ESRD on HD MWF Nephrology attending to dialysis -ongoing issues with dialysis cannulation with Nephrology asking for IR angiogram  Anemia of chronic kidney disease compounded by postoperative anemia Hemoglobin appears to have stabilized at approximately 9 - no evidence of gross blood loss  Malnutrition of moderate degree  related to chronic illness (ESRD, schizoaffective disorder) as evidenced by moderate fat depletion, severe fat depletion, moderate muscle depletion   Family Communication: No family present at time of exam today Disposition: Anticipate return to SNF after cleared from orthopedic standpoint   Objective: Blood pressure 126/60, pulse 100, temperature 99.2 F (37.3 C), temperature source Oral, resp. rate 18, height 5\' 4"  (1.626 m), weight 49.1 kg, SpO2 97  %.  Intake/Output Summary (Last 24 hours) at 03/02/2023 0907 Last data filed at 03/01/2023 2148 Gross per 24 hour  Intake 100 ml  Output 400 ml  Net -300 ml    Filed Weights   02/28/23 0750 03/01/23 0758 03/01/23 1345  Weight: 48.6 kg 50.8 kg 49.1 kg    Examination: General: No acute respiratory distress Lungs: Clear to auscultation bilaterally  Cardiovascular: Regular rate and rhythm without murmur Abdomen: Nontender, nondistended, soft, bowel sounds positive Extremities: No significant edema B lower extremities  CBC: Recent Labs  Lab 02/27/23 0650 02/28/23 0758 03/01/23 0809 03/02/23 0109  WBC 5.8  --  8.1 8.9  HGB 9.7* 11.6* 9.0* 9.1*  HCT 30.9* 34.0* 28.0* 28.1*  MCV 94.8  --  92.7 95.6  PLT 279  --  281 247    Basic Metabolic Panel: Recent Labs  Lab 02/27/23 0650 02/28/23 0758 02/28/23 1639 03/01/23 0809  NA 136 133* 133* 134*  K 5.4* 5.0 5.6* 4.9  CL 98 96* 93* 93*  CO2 18*  --  23 22  GLUCOSE 68* 96 150* 107*  BUN 83* 43* 46* 61*  CREATININE 11.90* 7.40* 7.79* 9.11*  CALCIUM 8.6*  --  9.3 9.5  PHOS 6.6*  --  7.5* 7.8*    GFR: Estimated Creatinine Clearance: 4.3 mL/min (A) (by C-G formula based on SCr of 9.11 mg/dL (H)).   Scheduled Meds:  acetaminophen  650 mg Oral TID   benztropine  0.5 mg Oral BID   Chlorhexidine Gluconate Cloth  6 each Topical Q0600   docusate sodium  100 mg Oral BID   feeding supplement (NEPRO CARB STEADY)  237 mL Oral BID BM   heparin  5,000 Units Subcutaneous Q8H   mirtazapine  15 mg Oral QHS   multivitamin  1 tablet Oral QHS   pantoprazole  40 mg Oral Daily   senna  1 tablet Oral BID   sevelamer carbonate  800 mg Oral TID WC   valproic acid  250 mg Oral TID WC     LOS: 4 days   Lonia Blood, MD [facility] Office  [phone] Pager - Text Page per Loretha Stapler  If 7PM-7AM, please contact night-coverage per Amion 03/02/2023, 9:07 AM

## 2023-03-02 NOTE — Consult Note (Cosign Needed Addendum)
Chief Complaint: Patient was seen in consultation today for problem with AVF   Referring Physician(s): Fote,Bertrand  Supervising Physician: Gilmer Mor  Patient Status: [facility] - In-pt  History of Present Illness: Madison [name] is a 72 y.o. female admitted after fall with left hip fracture requiring left hip hemiarthroplasty on 6/6. She is recovering from that. She also has hx of ESRD on HD.  Has a left UE AVF originally placed in 2018. Care everywhere records finds: Lt BVT fistula constructed at [facility] by Dr. Marcial Pacas on 05.31.2018. It initially had 2 clotting episodes, declotted on 04.18.2019, then within a month on 05.14.2019, with placement of a 9 mm x 8 cm Covera covered stent in the transposition swing segment. Subsequently got a 10 mm x 4 cm Fluency Plus covered stent distal extension on 1.14.2021. Subsequently has had a couple or so other angiogram/angioplasty procedures, Last done on 12.14.2023 and 01/22/2023 in [place].  Due to persistent high flow pressures, nephrology team would like for repeat fistulogram while pt is here.  PMHx, meds, labs, imaging, allergies reviewed.    Past Medical History:  Diagnosis Date   Anxiety    Bipolar disorder (HCC)    ESRD (end stage renal disease) on dialysis (HCC)    Neuropathy    Schizophrenia (HCC)     Past Surgical History:  Procedure Laterality Date   CHOLECYSTECTOMY     COLONOSCOPY N/A 01/20/2016   Dr. Darrick Penna: 12 mm tubular adenoma removed from the transverse colon, 4 hyperplastic polyps removed from the rectum and sigmoid colon.  Next colonoscopy planned for April 2020.   ESOPHAGOGASTRODUODENOSCOPY N/A 12/19/2017   Procedure: ESOPHAGOGASTRODUODENOSCOPY (EGD);  Surgeon: West Bali, MD;  Location: [facility];  Service: Endoscopy;  Laterality: N/A;  8:30am   fistula left arm     GIVENS CAPSULE STUDY N/A 01/14/2018   Procedure: GIVENS CAPSULE STUDY;  Surgeon: West Bali, MD;  Location: [facility];   Service: Endoscopy;  Laterality: N/A;  7:30am   HEMICOLECTOMY Right    TUBAL LIGATION      Allergies: Patient has no known allergies.  Medications:  Current Facility-Administered Medications:    acetaminophen (TYLENOL) tablet 650 mg, 650 mg, Oral, TID, Montez Morita, PA-C, 650 mg at 03/01/23 2148   ARIPiprazole (ABILIFY) tablet 15 mg, 15 mg, Oral, Q dialysis, Montez Morita, PA-C   benztropine (COGENTIN) tablet 0.5 mg, 0.5 mg, Oral, BID, Montez Morita, PA-C, 0.5 mg at 03/01/23 2148   Chlorhexidine Gluconate Cloth 2 % PADS 6 each, 6 each, Topical, Q0600, Montez Morita, PA-C, 6 each at 03/02/23 4098   docusate sodium (COLACE) capsule 100 mg, 100 mg, Oral, BID, Montez Morita, PA-C, 100 mg at 03/01/23 2148   feeding supplement (NEPRO CARB STEADY) liquid 237 mL, 237 mL, Oral, BID BM, Lonia Blood, MD, 237 mL at 03/02/23 0803   fentaNYL (SUBLIMAZE) injection 12.5 mcg, 12.5 mcg, Intravenous, Q2H PRN, Montez Morita, PA-C, 12.5 mcg at 03/02/23 0816   haloperidol lactate (HALDOL) injection 1-2 mg, 1-2 mg, Intravenous, Q6H PRN, Jetty Duhamel T, MD, 1 mg at 03/02/23 1022   heparin injection 5,000 Units, 5,000 Units, Subcutaneous, Q8H, Montez Morita, PA-C, 5,000 Units at 03/02/23 1191   menthol-cetylpyridinium (CEPACOL) lozenge 3 mg, 1 lozenge, Oral, PRN **OR** phenol (CHLORASEPTIC) mouth spray 1 spray, 1 spray, Mouth/Throat, PRN, Montez Morita, PA-C   mirtazapine (REMERON) tablet 15 mg, 15 mg, Oral, QHS, Montez Morita, PA-C, 15 mg at 03/01/23 2148   multivitamin (RENA-VIT) tablet 1 tablet,  1 tablet, Oral, QHS, Lonia Blood, MD, 1 tablet at 03/01/23 2148   ondansetron (ZOFRAN) tablet 4 mg, 4 mg, Oral, Q6H PRN **OR** ondansetron (ZOFRAN) injection 4 mg, 4 mg, Intravenous, Q6H PRN, Montez Morita, PA-C   oxyCODONE (Oxy IR/ROXICODONE) immediate release tablet 5-10 mg, 5-10 mg, Oral, Q4H PRN, Montez Morita, PA-C, 5 mg at 03/01/23 1744   pantoprazole (PROTONIX) EC tablet 40 mg, 40 mg, Oral, Daily, Montez Morita, PA-C,  40 mg at 02/27/23 1408   senna (SENOKOT) tablet 8.6 mg, 1 tablet, Oral, BID, Montez Morita, PA-C, 8.6 mg at 03/01/23 2148   sevelamer carbonate (RENVELA) tablet 800 mg, 800 mg, Oral, TID WC, Montez Morita, PA-C, 800 mg at 03/01/23 1618   valproic acid (DEPAKENE) 250 MG/5ML solution 250 mg, 250 mg, Oral, TID WC, Montez Morita, PA-C, 250 mg at 03/01/23 1619    Family History  Problem Relation Age of Onset   Alzheimer's disease Mother    Cancer Mother        pancreatic   Diabetes Mother    Prostate cancer Father    Kidney failure Sister    Breast cancer Neg Hx    Colon cancer Neg Hx     Social History   Socioeconomic History   Marital status: Single    Spouse name: Not on file   Number of children: Not on file   Years of education: Not on file   Highest education level: Not on file  Occupational History   Not on file  Tobacco Use   Smoking status: Never   Smokeless tobacco: Never  Vaping Use   Vaping Use: Never used  Substance and Sexual Activity   Alcohol use: No   Drug use: No   Sexual activity: Not Currently    Birth control/protection: Surgical    Comment: hyst/tubal  Other Topics Concern   Not on file  Social History Narrative   Not on file   Social Determinants of Health   Financial Resource Strain: Not on file  Food Insecurity: Not on file  Transportation Needs: Not on file  Physical Activity: Not on file  Stress: Not on file  Social Connections: Not on file    Review of Systems: A 12 point ROS discussed and pertinent positives are indicated in the HPI above.  All other systems are negative.  Review of Systems  Vital Signs: BP 126/60 (BP Location: Left Arm)   Pulse 100   Temp 99.2 F (37.3 C) (Oral)   Resp 18   Ht 5\' 4"  (1.626 m)   Wt 108 lb 3.9 oz (49.1 kg)   SpO2 97%   BMI 18.58 kg/m   Physical Exam Constitutional:      Appearance: She is not ill-appearing.  HENT:     Mouth/Throat:     Mouth: Mucous membranes are moist.     Pharynx:  Oropharynx is clear.  Cardiovascular:     Rate and Rhythm: Normal rate and regular rhythm.     Heart sounds: Normal heart sounds.  Pulmonary:     Effort: Pulmonary effort is normal. No respiratory distress.     Breath sounds: Normal breath sounds.  Musculoskeletal:     Comments: (L)UE AVF palpable, good pulse/thrill. Aneurysmal changes. Hand warm, good radial pulse  Neurological:     Mental Status: She is alert.     Comments: Mumbles, difficult to hear. Answers to questions not appropriate     Imaging: DG Hip Port Unilat With Pelvis 1V Left  Result  Date: 02/28/2023 CLINICAL DATA:  Postop left hip replacement. EXAM: DG HIP (WITH OR WITHOUT PELVIS) 1V PORT LEFT COMPARISON:  02/25/2023 FINDINGS: Status post left hip hemiarthroplasty. No evidence for immediate hardware complication. Gas in the overlying soft tissues is compatible with the immediate postoperative state. IMPRESSION: Status post left hip hemiarthroplasty without evidence for immediate hardware complication. Electronically Signed   By: Kennith Center M.D.   On: 02/28/2023 13:25   Chest Portable 1 View  Result Date: 02/26/2023 CLINICAL DATA:  Preop hip fracture EXAM: PORTABLE CHEST 1 VIEW COMPARISON:  Chest x-ray 02/25/2023 FINDINGS: Heart is enlarged. There central pulmonary vascular congestion. There is no focal lung infiltrate, pleural effusion or pneumothorax. Vascular stent is seen in the left arm. There surgical clips in the right abdomen. No acute fractures. IMPRESSION: Cardiomegaly with central pulmonary vascular congestion. Electronically Signed   By: Darliss Cheney M.D.   On: 02/26/2023 21:11    Labs:  CBC: Recent Labs    02/26/23 2044 02/27/23 0650 02/28/23 0758 03/01/23 0809 03/02/23 0109  WBC 7.1 5.8  --  8.1 8.9  HGB 10.8* 9.7* 11.6* 9.0* 9.1*  HCT 33.9* 30.9* 34.0* 28.0* 28.1*  PLT 299 279  --  281 247    COAGS: No results for input(s): "INR", "APTT" in the last 8760 hours.  BMP: Recent Labs     02/26/23 2044 02/27/23 0650 02/28/23 0758 02/28/23 1639 03/01/23 0809  NA 138 136 133* 133* 134*  K 6.4* 5.4* 5.0 5.6* 4.9  CL 98 98 96* 93* 93*  CO2 22 18*  --  23 22  GLUCOSE 76 68* 96 150* 107*  BUN 83* 83* 43* 46* 61*  CALCIUM 9.7 8.6*  --  9.3 9.5  CREATININE 12.35* 11.90* 7.40* 7.79* 9.11*  GFRNONAA 3* 3*  --  5* 4*    LIVER FUNCTION TESTS: Recent Labs    10/24/22 1139 10/25/22 1008 12/26/22 1153 12/29/22 0416 02/27/23 0650 02/28/23 1639 03/01/23 0809  BILITOT 0.7  --  0.7  --   --   --   --   AST 16  --  19  --   --   --   --   ALT 10  --  14  --   --   --   --   ALKPHOS 75  --  81  --   --   --   --   PROT 7.2  --  8.2*  --   --   --   --   ALBUMIN 3.6   < > 3.9 3.0* 2.5* 3.0* 2.7*   < > = values in this interval not displayed.     Assessment and Plan: ESRD on HD Left UE AV fistula with increased pressures. Plan for fistulogram, possible angioplasty on Mon 6/10. Risks and benefits discussed with the patient and her sister including, but not limited to bleeding, infection, vascular injury, pulmonary embolism, need for tunneled HD catheter placement or even death.  All questions were answered, Consent obtained.    Electronically Signed: Brayton El, PA-C 03/02/2023, 12:21 PM   I spent a total of 20 minutes in face to face in clinical consultation, greater than 50% of which was counseling/coordinating care for AV fistulogram

## 2023-03-02 NOTE — Progress Notes (Signed)
[facility] Progress Note   72 y.o. female with hypertension, schizoaffective disorder, anemia, bipolar mood disorder, ESRD on HD MWF p/w fall sustaining hip fracture seen as a consultation for the management of ESRD.  She lives in nursing home and initially presented to [facility] ER after a fall.   Left hip hemiarthroplasty on 6/6.  Receives dialysis at [facility] in [place], MWF, 225 minutes, 2K, 2.5 calcium, left upper extremity AV fistula for the access.  Assessment/ Plan:   # Left hip fracture after the fall.  Ortho team is already following and left hip hemiarthroplasty on 6/6.    # ESRD: MWF.    Has had issues with cannulation always requiring at least 3 sticks every treatment.  Given continuing to have issues with dialysis cannulation and also requiring additional cannulation attempt mid treatment for high venous pressures requested angiogram from [facility]; not just one dialysis treatment.   Appreciate [facility] seeing the pt in consultation so quickly. Will plan dialysis around intervention Monday.  # Hypertension: Mildly elevated BP before starting dialysis.  Volume acceptable.  Blood pressure expect to improve after HD.  I noted midodrine is ordered which may not need if her blood pressure remains elevated.   # Anemia of ESRD: Hemoglobin at goal.  Unknown last ESA.   # Metabolic Bone Disease: Currently on sevelamer when able to take orally.  Monitor labs.   # Hyperkalemia: Managing with HD.     Subjective:   Denies f/c/n/v/sob but c/o leg pain.   Objective:   BP 126/60 (BP Location: Left Arm)   Pulse 100   Temp 99.2 F (37.3 C) (Oral)   Resp 18   Ht 5\' 4"  (1.626 m)   Wt 49.1 kg   SpO2 97%   BMI 18.58 kg/m   Intake/Output Summary (Last 24 hours) at 03/02/2023 1232 Last data filed at 03/01/2023 2148 Gross per 24 hour  Intake 100 ml  Output 400 ml  Net -300 ml   Weight change: 2.2 kg  Physical Exam: Gen: NAD Respiratory: Clear bilateral, no wheezing or  crackle Cardiovascular: Regular rate rhythm S1-S2 normal, no rubs GI: Abdomen soft, nontender, nondistended Extremities, no cyanosis or clubbing, no edema Skin: No rash or ulcer Neurology: Alert, awake, confused female Dialysis Access: AV fistula pulsatile  Imaging: DG Hip Port Unilat With Pelvis 1V Left  Result Date: 02/28/2023 CLINICAL DATA:  Postop left hip replacement. EXAM: DG HIP (WITH OR WITHOUT PELVIS) 1V PORT LEFT COMPARISON:  02/25/2023 FINDINGS: Status post left hip hemiarthroplasty. No evidence for immediate hardware complication. Gas in the overlying soft tissues is compatible with the immediate postoperative state. IMPRESSION: Status post left hip hemiarthroplasty without evidence for immediate hardware complication. Electronically Signed   By: Kennith Center M.D.   On: 02/28/2023 13:25    Labs: BMET Recent Labs  Lab 02/26/23 2044 02/27/23 0650 02/28/23 0758 02/28/23 1639 03/01/23 0809  NA 138 136 133* 133* 134*  K 6.4* 5.4* 5.0 5.6* 4.9  CL 98 98 96* 93* 93*  CO2 22 18*  --  23 22  GLUCOSE 76 68* 96 150* 107*  BUN 83* 83* 43* 46* 61*  CREATININE 12.35* 11.90* 7.40* 7.79* 9.11*  CALCIUM 9.7 8.6*  --  9.3 9.5  PHOS  --  6.6*  --  7.5* 7.8*   CBC Recent Labs  Lab 02/26/23 2044 02/27/23 0650 02/28/23 0758 03/01/23 0809 03/02/23 0109  WBC 7.1 5.8  --  8.1 8.9  HGB 10.8* 9.7* 11.6* 9.0* 9.1*  HCT 33.9* 30.9* 34.0* 28.0* 28.1*  MCV 95.8 94.8  --  92.7 95.6  PLT 299 279  --  281 247    Medications:     acetaminophen  650 mg Oral TID   benztropine  0.5 mg Oral BID   Chlorhexidine Gluconate Cloth  6 each Topical Q0600   docusate sodium  100 mg Oral BID   feeding supplement (NEPRO CARB STEADY)  237 mL Oral BID BM   heparin  5,000 Units Subcutaneous Q8H   mirtazapine  15 mg Oral QHS   multivitamin  1 tablet Oral QHS   pantoprazole  40 mg Oral Daily   senna  1 tablet Oral BID   sevelamer carbonate  800 mg Oral TID WC   valproic acid  250 mg Oral TID WC       Paulene Floor, MD 03/02/2023, 12:32 PM

## 2023-03-02 NOTE — TOC Progression Note (Signed)
Transition of Care [facility]) - Progression Note    Patient Details  Name: QUINNLEY COLASURDO MRN: [id number] Date of Birth: 19-Nov-1950  Transition of [facility] CM/SW Contact  Helene Kelp, [place] Phone Number: 03/02/2023, 1:35 PM  Clinical Narrative:     CSW met with the patient at the bedside to review her disposition and identify her current SNF prior to the hospital admission. Pt noted [facility] was the SNF where she came from prior to the hospital admission.   This Clinical research associate met with the patient's bedside RN to review her disposition. The RN noted the patient or family may have some concerns regarding returning to the [facility] based on possible fall at the facility.   Follow-up  Pt has a hip fracture and recommendations for post care will be determined in the following days. Unit weekday CSW will continue to follow and provide TOC support.     Expected Discharge Plan and Services    Social Determinants of Health (SDOH) Interventions SDOH Screenings   Alcohol Screen: Low Risk  (12/25/2018)  Tobacco Use: Low Risk  (03/02/2023)    Readmission Risk Interventions    12/27/2022    8:27 AM  Readmission Risk Prevention Plan  Transportation Screening Complete  HRI or Home Care Consult Complete  Social Work Consult for Recovery Care Planning/Counseling Complete  Palliative Care Screening Not Applicable  Medication Review [employer]) Complete

## 2023-03-02 NOTE — Progress Notes (Signed)
Orthopaedic Trauma Progress Note  SUBJECTIVE: Complaining of left leg pain.  Wants to be left alone.  OBJECTIVE:  Vitals:   03/02/23 0446 03/02/23 0713  BP: (!) 121/55 126/60  Pulse:    Resp: 20 18  Temp: 98.6 F (37 C) 99.2 F (37.3 C)  SpO2: 99% 97%    General: Resting in bed, no acute distress Respiratory: No increased work of breathing.  Left lower extremity: Knee immobilizer in place.  Dressing to left hip is clean, dry, intact.  Diffuse tenderness about the left ankle but no obvious swelling of concern.  Distal motor and sensory function grossly intact.  Compartment soft compressible. + DP pulse  IMAGING: Stable post op imaging.  X-rays left ankle ordered for this a.m.  LABS:  Results for orders placed or performed during the hospital encounter of 02/26/23 (from the past 24 hour(s))  CBC     Status: Abnormal   Collection Time: 03/01/23  8:09 AM  Result Value Ref Range   WBC 8.1 4.0 - 10.5 K/uL   RBC 3.02 (L) 3.87 - 5.11 MIL/uL   Hemoglobin 9.0 (L) 12.0 - 15.0 g/dL   HCT 16.1 (L) 09.6 - 04.5 %   MCV 92.7 80.0 - 100.0 fL   MCH 29.8 26.0 - 34.0 pg   MCHC 32.1 30.0 - 36.0 g/dL   RDW 40.9 (H) 81.1 - 91.4 %   Platelets 281 150 - 400 K/uL   nRBC 0.0 0.0 - 0.2 %  Renal function panel     Status: Abnormal   Collection Time: 03/01/23  8:09 AM  Result Value Ref Range   Sodium 134 (L) 135 - 145 mmol/L   Potassium 4.9 3.5 - 5.1 mmol/L   Chloride 93 (L) 98 - 111 mmol/L   CO2 22 22 - 32 mmol/L   Glucose, Bld 107 (H) 70 - 99 mg/dL   BUN 61 (H) 8 - 23 mg/dL   Creatinine, Ser 7.82 (H) 0.44 - 1.00 mg/dL   Calcium 9.5 8.9 - 95.6 mg/dL   Phosphorus 7.8 (H) 2.5 - 4.6 mg/dL   Albumin 2.7 (L) 3.5 - 5.0 g/dL   GFR, Estimated 4 (L) >60 mL/min   Anion gap 19 (H) 5 - 15  CBC     Status: Abnormal   Collection Time: 03/02/23  1:09 AM  Result Value Ref Range   WBC 8.9 4.0 - 10.5 K/uL   RBC 2.94 (L) 3.87 - 5.11 MIL/uL   Hemoglobin 9.1 (L) 12.0 - 15.0 g/dL   HCT 21.3 (L) 08.6 - 57.8 %    MCV 95.6 80.0 - 100.0 fL   MCH 31.0 26.0 - 34.0 pg   MCHC 32.4 30.0 - 36.0 g/dL   RDW 46.9 (H) 62.9 - 52.8 %   Platelets 247 150 - 400 K/uL   nRBC 0.0 0.0 - 0.2 %    ASSESSMENT: Madison [name] is a 72 y.o. female, 2 Days Post-Op s/p HEMIARTHROPLASTY LEFT HIP  CV/Blood loss: Acute blood loss anemia, Hgb 9.1 this morning.  Stable from 03/01/2023. Hemodynamically stable  PLAN: Weightbearing: WBAT LLE ROM: Posterior hip precautions x 12 weeks Incisional and dressing care: Daily wound care as needed starting 03/03/2023 Showering: Okay to being getting incisions wet 03/03/2023 Orthopedic device(s): Knee immobilizer LLE Pain management: Continue multimodal pain control.  Minimize narcotics. VTE prophylaxis:  Heparin , SCDs ID: Perioperative ABX completed  Foley/Lines:  No foley, KVO IVFs Dispo: PT/OT evaluation ongoing.  Okay for discharge back to SNF from ortho  standpoint once cleared by medicine team and therapies  Follow - up plan: 2 weeks with Dr. Apolinar Junes information:  Truitt Merle MD, Thyra Breed PA-C. After hours and holidays please check [email or web address] for group call information for [facility]   Thompson Caul, PA-C [phone] (office) [email or web address]

## 2023-03-02 NOTE — Evaluation (Signed)
Occupational Therapy Evaluation Patient Details Name: Madison [name] MRN: [id number] DOB: Jan 28, 1951 Today's Date: 03/02/2023   History of Present Illness 72 y.o. female admitted 6/4 for fall and hip pain, s/p UNIPOLAR HEMIARTHROPLASTY OF THE LEFT HIP on 6/6. PMHx: history of ESRD, HTN, and schizophrenia versus bipolar disorder.   Clinical Impression   Pt admitted for above dx, PTA patient was at SNF and had assist with bADLs and used wheelchair at baseline, has a hx of cognitive impairments. Pt currently presenting with impaired balance just sitting EOB and is unable to recall any precautions despite simplified cues. Pt remains confused and limited by pain as well. Max A for bed mobility. Pt would benefit from continued acute skilled OT services to address above deficits and help transition to next level of care. Patient would benefit from post acute skilled rehab facility with <3 hours of therapy and 24/7 support       Recommendations for follow up therapy are one component of a multi-disciplinary discharge planning process, led by the attending physician.  Recommendations may be updated based on patient status, additional functional criteria and insurance authorization.   Assistance Recommended at Discharge Frequent or constant Supervision/Assistance  Patient can return home with the following Two people to help with walking and/or transfers;Direct supervision/assist for financial management;Two people to help with bathing/dressing/bathroom;Direct supervision/assist for medications management;Assistance with cooking/housework;Assist for transportation;Help with stairs or ramp for entrance;Assistance with feeding    Functional Status Assessment  Patient has had a recent decline in their functional status and demonstrates the ability to make significant improvements in function in a reasonable and predictable amount of time.  Equipment Recommendations  Other (comment) (defer to next level of  care)    Recommendations for Other Services       Precautions / Restrictions Precautions Precautions: Posterior Hip (left) Precaution Booklet Issued: Yes (comment) Required Braces or Orthoses: Knee Immobilizer - Left Knee Immobilizer - Left: On at all times;Other (comment) (until pt demonstrates compliance with hip precautions) Restrictions Weight Bearing Restrictions: Yes LLE Weight Bearing: Weight bearing as tolerated      Mobility Bed Mobility Overal bed mobility: Needs Assistance Bed Mobility: Rolling, Sidelying to Sit Rolling: Max assist Sidelying to sit: Total assist       General bed mobility comments: pt continuously moaning in pain with L hip movement. Upon entry pt was noted to have ankles inverted, placed pillow against ankles to reduce risk of adduction and internal hip rotation    Transfers                   General transfer comment: deferred due to impaired cognition and poor balance      Balance Overall balance assessment: Needs assistance Sitting-balance support: Single extremity supported, Feet supported Sitting balance-Leahy Scale: Poor Sitting balance - Comments: pt unable to support balance EOB, brief moments of min A before falling backwards Postural control: Posterior lean     Standing balance comment: NT                           ADL either performed or assessed with clinical judgement   ADL Overall ADL's : Needs assistance/impaired Eating/Feeding: Bed level;Moderate assistance   Grooming: Bed level;Minimal assistance   Upper Body Bathing: Minimal assistance;Bed level   Lower Body Bathing: Total assistance;Bed level   Upper Body Dressing : Moderate assistance;Bed level   Lower Body Dressing: Total assistance;Bed level     Toilet Transfer Details (  indicate cue type and reason): NT, rec bed pan   Toileting - Clothing Manipulation Details (indicate cue type and reason): NT   Tub/Shower Transfer Details (indicate  cue type and reason): NT   General ADL Comments: Deferred OOB mobility due to poor EOB balance     Vision         Perception     Praxis      Pertinent Vitals/Pain Pain Assessment Pain Assessment: Faces Faces Pain Scale: Hurts whole lot Pain Location: L hip Pain Descriptors / Indicators: Aching, Discomfort, Grimacing, Guarding, Moaning Pain Intervention(s): Monitored during session, Repositioned     Hand Dominance Right   Extremity/Trunk Assessment Upper Extremity Assessment Upper Extremity Assessment: Generalized weakness   Lower Extremity Assessment Lower Extremity Assessment: Defer to PT evaluation       Communication Communication Communication: Expressive difficulties   Cognition Arousal/Alertness: Lethargic Behavior During Therapy: Anxious, Flat affect Overall Cognitive Status: History of cognitive impairments - at baseline                                 General Comments: Family feels cognition has declined past couple of months. Pt continuously saying "I'm older than you think I am."     General Comments       Exercises     Shoulder Instructions      Home Living Family/patient expects to be discharged to:: Skilled nursing facility                                 Additional Comments: Has been in SNF since April, family reports gradual decline in function since then, has not been very mobile in SNF, using w/c for mobility.      Prior Functioning/Environment Prior Level of Function : Needs assist             Mobility Comments: Using w/c at SNF, ind prior per family ADLs Comments: Needs assist with ADLs at SNF since admission in april        OT Problem List: Decreased strength;Decreased activity tolerance;Impaired balance (sitting and/or standing);Decreased cognition      OT Treatment/Interventions: Self-care/ADL training;Therapeutic activities;Therapeutic exercise;Patient/family education;Balance training;DME  and/or AE instruction    OT Goals(Current goals can be found in the care plan section) Acute Rehab OT Goals Patient Stated Goal: none stated OT Goal Formulation: With patient Time For Goal Achievement: 03/16/23 Potential to Achieve Goals: Good ADL Goals Additional ADL Goal #1: Pt will demonstrate 5 mins EOB sitting tolerance with min guard assist in preparation for OOB mobility and bedside bADLS Additional ADL Goal #2: Pt/family will verbalize and demonstrate understanding of 3/3 hip precautions Additional ADL Goal #3: Pt will complete rolling with Min A  OT Frequency: Min 1X/week    Co-evaluation              AM-PAC OT "6 Clicks" Daily Activity     Outcome Measure Help from another person eating meals?: A Lot Help from another person taking care of personal grooming?: A Little Help from another person toileting, which includes using toliet, bedpan, or urinal?: Total Help from another person bathing (including washing, rinsing, drying)?: Total Help from another person to put on and taking off regular upper body clothing?: A Lot Help from another person to put on and taking off regular lower body clothing?: Total 6 Click Score: 10   End  of Session Nurse Communication: Need for lift equipment;Patient requests pain meds (pt likely needing pain meds)  Activity Tolerance: Patient limited by pain Patient left: in bed;with call bell/phone within reach;with bed alarm set  OT Visit Diagnosis: Muscle weakness (generalized) (M62.81);Other symptoms and signs involving cognitive function;Pain Pain - Right/Left: Left Pain - part of body: Hip                Time: 7829-5621 OT Time Calculation (min): 21 min Charges:  OT General Charges $OT Visit: 1 Visit OT Evaluation $OT Eval Moderate Complexity: 1 Mod  03/02/2023  AB, OTR/L  Acute Rehabilitation Services  Office: [phone]   Tristan Schroeder 03/02/2023, 6:10 PM

## 2023-03-03 DIAGNOSIS — N186 End stage renal disease: Secondary | ICD-10-CM | POA: Diagnosis not present

## 2023-03-03 DIAGNOSIS — E440 Moderate protein-calorie malnutrition: Secondary | ICD-10-CM | POA: Diagnosis not present

## 2023-03-03 DIAGNOSIS — F250 Schizoaffective disorder, bipolar type: Secondary | ICD-10-CM | POA: Diagnosis not present

## 2023-03-03 DIAGNOSIS — Z992 Dependence on renal dialysis: Secondary | ICD-10-CM | POA: Diagnosis not present

## 2023-03-03 LAB — RENAL FUNCTION PANEL
Albumin: 2.4 g/dL — ABNORMAL LOW (ref 3.5–5.0)
Anion gap: 19 — ABNORMAL HIGH (ref 5–15)
BUN: 47 mg/dL — ABNORMAL HIGH (ref 8–23)
CO2: 25 mmol/L (ref 22–32)
Calcium: 10 mg/dL (ref 8.9–10.3)
Chloride: 93 mmol/L — ABNORMAL LOW (ref 98–111)
Creatinine, Ser: 7.36 mg/dL — ABNORMAL HIGH (ref 0.44–1.00)
GFR, Estimated: 5 mL/min — ABNORMAL LOW (ref >60)
Glucose, Bld: 85 mg/dL (ref 70–99)
Phosphorus: 6.1 mg/dL — ABNORMAL HIGH (ref 2.5–4.6)
Potassium: 5 mmol/L (ref 3.5–5.1)
Sodium: 137 mmol/L (ref 135–145)

## 2023-03-03 LAB — CBC
HCT: 29.3 % — ABNORMAL LOW (ref 36.0–46.0)
Hemoglobin: 9.6 g/dL — ABNORMAL LOW (ref 12.0–15.0)
MCH: 30.7 pg (ref 26.0–34.0)
MCHC: 32.8 g/dL (ref 30.0–36.0)
MCV: 93.6 fL (ref 80.0–100.0)
Platelets: 242 10*3/uL (ref 150–400)
RBC: 3.13 MIL/uL — ABNORMAL LOW (ref 3.87–5.11)
RDW: 19.5 % — ABNORMAL HIGH (ref 11.5–15.5)
WBC: 7.1 10*3/uL (ref 4.0–10.5)
nRBC: 0 % (ref 0.0–0.2)

## 2023-03-03 NOTE — Progress Notes (Signed)
[facility] Progress Note   72 y.o. female with hypertension, schizoaffective disorder, anemia, bipolar mood disorder, ESRD on HD MWF p/w fall sustaining hip fracture seen as a consultation for the management of ESRD.  She lives in nursing home and initially presented to [facility] ER after a fall.   Left hip hemiarthroplasty on 6/6.  Receives dialysis at [facility] in [place], MWF, 225 minutes, 2K, 2.5 calcium, left upper extremity AV fistula for the access.  Assessment/ Plan:   # Left hip fracture after the fall.  Ortho team is already following and left hip hemiarthroplasty on 6/6.    # ESRD: MWF.    Has had issues with cannulation always requiring at least 3 sticks every treatment.  Given continuing to have issues with dialysis cannulation and also requiring additional cannulation attempt mid treatment for high venous pressures requested angiogram from [facility]; not just one dialysis treatment.   Appreciate [facility] seeing the pt in consultation so quickly and will plan dialysis around intervention Monday. If we have to hold HD till Tuesday, then will do that as well.  # Hypertension: Mildly elevated BP before starting dialysis.  Volume acceptable.  Blood pressure expect to improve after HD.  I noted midodrine is ordered which may not need if her blood pressure remains elevated.   # Anemia of ESRD: Hemoglobin at goal.  Unknown last ESA.   # Metabolic Bone Disease: Currently on sevelamer when able to take orally.  Monitor labs.   # Hyperkalemia: Managing with HD.     Subjective:   Denies f/c/n/v/sob but main issue now is c/o leg pain. Flexing right hip helps only a little.    Objective:   BP 132/68 (BP Location: Right Arm)   Pulse 99   Temp 98.4 F (36.9 C)   Resp 16   Ht 5\' 4"  (1.626 m)   Wt 49.1 kg   SpO2 97%   BMI 18.58 kg/m   Intake/Output Summary (Last 24 hours) at 03/03/2023 0844 Last data filed at 03/02/2023 2100 Gross per 24 hour  Intake 150 ml  Output --   Net 150 ml   Weight change:   Physical Exam: Gen: NAD Respiratory: Clear bilateral, no wheezing or crackle Cardiovascular: Regular rate rhythm S1-S2 normal, no rubs GI: Abdomen soft, nontender, nondistended Extremities, no cyanosis or clubbing, no edema Skin: No rash or ulcer Neurology: Alert, awake, confused female Dialysis Access: AV fistula pulsatile but + bruit  Imaging: DG Ankle Complete Left  Result Date: 03/02/2023 CLINICAL DATA:  Left ankle pain. EXAM: LEFT ANKLE COMPLETE - 3+ VIEW COMPARISON:  None Available. FINDINGS: There is diffuse decreased bone mineralization. The ankle mortise is symmetric and intact. There is subchondral lucency within the medial aspect of the talar dome measuring up to 8 mm in transverse dimension and 4 mm in craniocaudal depth. Mild posterior tibiotalar joint space narrowing. No acute fracture or dislocation, although evaluation is limited by the patient's overlying (nonslip) socks with dense overlying patterns. IMPRESSION: 1. Subchondral lucency within the medial aspect of the talar dome consistent with a degenerative subchondral cyst. 2. No acute fracture is seen. Electronically Signed   By: Neita Garnet M.D.   On: 03/02/2023 13:10    Labs: BMET Recent Labs  Lab 02/26/23 2044 02/27/23 0650 02/28/23 0758 02/28/23 1639 03/01/23 0809 03/03/23 0135  NA 138 136 133* 133* 134* 137  K 6.4* 5.4* 5.0 5.6* 4.9 5.0  CL 98 98 96* 93* 93* 93*  CO2 22 18*  --  23 22 25   GLUCOSE 76 68* 96 150* 107* 85  BUN 83* 83* 43* 46* 61* 47*  CREATININE 12.35* 11.90* 7.40* 7.79* 9.11* 7.36*  CALCIUM 9.7 8.6*  --  9.3 9.5 10.0  PHOS  --  6.6*  --  7.5* 7.8* 6.1*   CBC Recent Labs  Lab 02/27/23 0650 02/28/23 0758 03/01/23 0809 03/02/23 0109 03/03/23 0135  WBC 5.8  --  8.1 8.9 7.1  HGB 9.7* 11.6* 9.0* 9.1* 9.6*  HCT 30.9* 34.0* 28.0* 28.1* 29.3*  MCV 94.8  --  92.7 95.6 93.6  PLT 279  --  281 247 242    Medications:     acetaminophen  650 mg Oral TID    benztropine  0.5 mg Oral BID   Chlorhexidine Gluconate Cloth  6 each Topical Q0600   docusate sodium  100 mg Oral BID   feeding supplement (NEPRO CARB STEADY)  237 mL Oral BID BM   heparin  5,000 Units Subcutaneous Q8H   mirtazapine  15 mg Oral QHS   multivitamin  1 tablet Oral QHS   pantoprazole  40 mg Oral Daily   senna  1 tablet Oral BID   sevelamer carbonate  800 mg Oral TID WC   valproic acid  250 mg Oral TID WC      Paulene Floor, MD 03/03/2023, 8:44 AM

## 2023-03-03 NOTE — Progress Notes (Signed)
Patient agreeable to taking meds overnight, crushed in applesauce. Refuses q2h repositioning. Played music in room overnight to help relax, patient little to no verbal outburst overnight.

## 2023-03-03 NOTE — Progress Notes (Signed)
Madison [name]  ZHY:[id number] DOB: 04/30/1951 DOA: 02/26/2023 PCP: Charlynne Pander, MD    Brief Narrative:  72 year old SNF resident with a history of ESRD, HTN, and schizophrenia versus bipolar disorder who presented to [facility] ER after a fall which occurred multiple days prior.  X-rays at the [facility] ER revealed a left femoral fracture.  The patient was transferred to [facility] as she needed a combination of an Orthopedist as well as access to inpatient dialysis.  Consultants:  Orthopedic Surgery  Goals of Care:  Code Status: Full Code   DVT prophylaxis: Subcutaneous heparin  Interim Hx: No new events recorded overnight.  Afebrile.  Vital signs stable.  Assessment & Plan:  Left femoral neck fracture Care per Orthopedic Surgery - s/p unipolar hemiarthroplasty of the left hip 02/28/2023 -appears to be stable from an orthopedic standpoint -cleared for discharge by Orthopedics  Schizoaffective disorder, bipolar type Continue usual home medical regimen -appears well compensated at present - utilize as needed Haldol for agitation  ESRD on HD MWF Nephrology attending to dialysis -ongoing issues with dialysis cannulation with Nephrology asking for IR angiogram  Anemia of chronic kidney disease compounded by postoperative anemia Hemoglobin has stabilized at approximately 9 - no evidence of gross blood loss  Malnutrition of moderate degree  related to chronic illness (ESRD, schizoaffective disorder) as evidenced by moderate fat depletion, severe fat depletion, moderate muscle depletion   Family Communication: No family present at time of exam today Disposition: Anticipate return to SNF after cleared from orthopedic standpoint   Objective: Blood pressure 132/68, pulse 99, temperature 98.4 F (36.9 C), resp. rate 16, height 5\' 4"  (1.626 m), weight 49.1 kg, SpO2 97 %.  Intake/Output Summary (Last 24 hours) at 03/03/2023 0949 Last data filed at 03/02/2023 2100 Gross per 24 hour  Intake  150 ml  Output --  Net 150 ml    Filed Weights   02/28/23 0750 03/01/23 0758 03/01/23 1345  Weight: 48.6 kg 50.8 kg 49.1 kg    Examination: General: No acute respiratory distress Lungs: Clear to auscultation bilaterally  Cardiovascular: Regular rate and rhythm without murmur Abdomen: Nontender, nondistended, soft, bowel sounds positive Extremities: No significant edema B LE  CBC: Recent Labs  Lab 03/01/23 0809 03/02/23 0109 03/03/23 0135  WBC 8.1 8.9 7.1  HGB 9.0* 9.1* 9.6*  HCT 28.0* 28.1* 29.3*  MCV 92.7 95.6 93.6  PLT 281 247 242    Basic Metabolic Panel: Recent Labs  Lab 02/28/23 1639 03/01/23 0809 03/03/23 0135  NA 133* 134* 137  K 5.6* 4.9 5.0  CL 93* 93* 93*  CO2 23 22 25   GLUCOSE 150* 107* 85  BUN 46* 61* 47*  CREATININE 7.79* 9.11* 7.36*  CALCIUM 9.3 9.5 10.0  PHOS 7.5* 7.8* 6.1*    GFR: Estimated Creatinine Clearance: 5.3 mL/min (A) (by C-G formula based on SCr of 7.36 mg/dL (H)).   Scheduled Meds:  acetaminophen  650 mg Oral TID   benztropine  0.5 mg Oral BID   Chlorhexidine Gluconate Cloth  6 each Topical Q0600   docusate sodium  100 mg Oral BID   feeding supplement (NEPRO CARB STEADY)  237 mL Oral BID BM   heparin  5,000 Units Subcutaneous Q8H   mirtazapine  15 mg Oral QHS   multivitamin  1 tablet Oral QHS   pantoprazole  40 mg Oral Daily   senna  1 tablet Oral BID   sevelamer carbonate  800 mg Oral TID WC   valproic acid  250 mg Oral TID WC     LOS: 5 days   Lonia Blood, MD [facility] Office  [phone] Pager - Text Page per Amion  If 7PM-7AM, please contact night-coverage per Amion 03/03/2023, 9:49 AM

## 2023-03-04 ENCOUNTER — Inpatient Hospital Stay (HOSPITAL_COMMUNITY): Payer: Medicare Other

## 2023-03-04 DIAGNOSIS — N186 End stage renal disease: Secondary | ICD-10-CM | POA: Diagnosis not present

## 2023-03-04 DIAGNOSIS — E440 Moderate protein-calorie malnutrition: Secondary | ICD-10-CM | POA: Diagnosis not present

## 2023-03-04 DIAGNOSIS — F250 Schizoaffective disorder, bipolar type: Secondary | ICD-10-CM | POA: Diagnosis not present

## 2023-03-04 DIAGNOSIS — Z992 Dependence on renal dialysis: Secondary | ICD-10-CM | POA: Diagnosis not present

## 2023-03-04 HISTORY — PX: IR US GUIDE VASC ACCESS LEFT: IMG2389

## 2023-03-04 HISTORY — PX: IR AV DIALY SHUNT INTRO NEEDLE/INTRACATH INITIAL W/PTA/IMG LEFT: IMG6103

## 2023-03-04 MED ORDER — MIDAZOLAM HCL 2 MG/2ML IJ SOLN
INTRAMUSCULAR | Status: AC
  Filled 2023-03-04: qty 2

## 2023-03-04 MED ORDER — SEVELAMER CARBONATE 800 MG PO TABS
1600.0000 mg | ORAL_TABLET | Freq: Three times a day (TID) | ORAL | Status: DC
  Administered 2023-03-04 – 2023-03-22 (×24): 1600 mg via ORAL
  Filled 2023-03-04 (×31): qty 2

## 2023-03-04 MED ORDER — LIDOCAINE-EPINEPHRINE 1 %-1:100000 IJ SOLN
INTRAMUSCULAR | Status: AC
  Filled 2023-03-04: qty 1

## 2023-03-04 MED ORDER — LIDOCAINE HCL (PF) 1 % IJ SOLN
10.0000 mL | Freq: Once | INTRAMUSCULAR | Status: AC
  Administered 2023-03-04: 10 mL

## 2023-03-04 MED ORDER — FENTANYL CITRATE (PF) 100 MCG/2ML IJ SOLN
INTRAMUSCULAR | Status: AC | PRN
  Administered 2023-03-04: 25 ug via INTRAVENOUS

## 2023-03-04 MED ORDER — FENTANYL CITRATE (PF) 100 MCG/2ML IJ SOLN
INTRAMUSCULAR | Status: AC
  Filled 2023-03-04: qty 2

## 2023-03-04 MED ORDER — DARBEPOETIN ALFA 60 MCG/0.3ML IJ SOSY
60.0000 ug | PREFILLED_SYRINGE | INTRAMUSCULAR | Status: DC
  Administered 2023-03-04 – 2023-03-12 (×2): 60 ug via SUBCUTANEOUS
  Filled 2023-03-04 (×2): qty 0.3

## 2023-03-04 MED ORDER — IOHEXOL 300 MG/ML  SOLN
100.0000 mL | Freq: Once | INTRAMUSCULAR | Status: AC | PRN
  Administered 2023-03-04: 50 mL via INTRAVENOUS

## 2023-03-04 NOTE — Progress Notes (Addendum)
Received patient in bed to unit.  Alert and oriented.  Informed consent signed and in chart.   TX duration: 3.14  Patient tolerated well.  Alert, without acute distress.  Hand-off given to patient's nurse.   Access used: L AVF Access issues: BFR reduced to 300 d/t high VP.  Total UF removed: Medication(s) given: Oxycodone 10mg  PO  Post HD weight: 48.3kg  03/04/23 1748  Vitals  Temp 98 F (36.7 C)  Temp Source Oral  BP (!) 95/53  MAP (mmHg) (!) 64  BP Location Right Arm  BP Method Automatic  Patient Position (if appropriate) Lying  Pulse Rate (!) 111  Pulse Rate Source Monitor  ECG Heart Rate (!) 112  Resp (!) 21  Oxygen Therapy  SpO2 98 %  O2 Device Room Air  Patient Activity (if Appropriate) In bed  Pulse Oximetry Type Continuous  During Treatment Monitoring  Intra-Hemodialysis Comments Tolerated well  Post Treatment  Dialyzer Clearance Heavily streaked  Duration of HD Treatment -hour(s) 3.14 hour(s) (circuit clotted with left on the tx.)  Liters Processed 69  Fluid Removed (mL) 1100 mL  Tolerated HD Treatment Yes  AVG/AVF Arterial Site Held (minutes) 7 minutes  AVG/AVF Venous Site Held (minutes) 7 minutes  Fistula / Graft Left Upper arm Arteriovenous fistula  Placement Date/Time: 04/06/20 0000   Orientation: Left  Access Location: Upper arm  Access Type: Arteriovenous fistula  Site Condition No complications  Fistula / Graft Assessment Present;Thrill;Bruit  Status Deaccessed     [facility]

## 2023-03-04 NOTE — Progress Notes (Signed)
   03/04/23 1641  Spiritual Encounters  Type of Visit Follow up  Care provided to: Family  Referral source Chaplain assessment  Reason for visit Routine spiritual support  OnCall Visit No  Spiritual Framework  Presenting Themes Values and beliefs;Significant life change;Caregiving needs;Impactful experiences and emotions  Community/Connection Family;Faith community;Spiritual leader  Patient Stress Factors Health changes;Major life changes  Family Stress Factors Major life changes;Loss of control  Interventions  Spiritual Care Interventions Made Established relationship of care and support;Compassionate presence;Reflective listening;Narrative/life review  Intervention Outcomes  Outcomes Reduced isolation;Awareness of support;Awareness around self/spiritual resourses;Connection to spiritual care  Spiritual Care Plan  Spiritual Care Issues Still Outstanding Referral made to (name) Raelene Bott)  Follow up plan  referring further care to Chaplain Raelene Bott as this is his assigned unit   Chaplain met with the patient's sister for a second time. Chaplain provided spiritual and emotional support for the patient's sister Meriam Sprague. Meriam Sprague has shared writings about the patient's life as well as her own. Chaplain is referring continued care to the unit's chaplain Raelene Bott.   Arlyce Dice, Chaplain Resident [phone]

## 2023-03-04 NOTE — Progress Notes (Signed)
Orthopaedic Trauma Service Progress Note  Patient ID: Madison [name] MRN: [id number] DOB/AGE: 1951-09-13 72 y.o.  Subjective:  Ortho issue stable Somehow her knee immobilizer go put back on. I cut it off and in half and threw in trash.  She does not need it anymore  R ankle xrays are negative Still c/o R knee pain  Admission xrays show no injury to right knee.  Will check dedicated knee xrays   ROS As above  Objective:   VITALS:   Vitals:   03/04/23 0850 03/04/23 0855 03/04/23 0900 03/04/23 0905  BP: (!) 128/58 134/62 130/70 139/65  Pulse: 89 95 92 96  Resp: 14 14 14 16   Temp:      TempSrc:      SpO2: 98% 97% 98% 94%  Weight:      Height:        Estimated body mass index is 18.58 kg/m as calculated from the following:   Height as of this encounter: 5\' 4"  (1.626 m).   Weight as of this encounter: 49.1 kg.   Intake/Output      06/09 0701 06/10 0700 06/10 0701 06/11 0700   P.O. 200    Total Intake(mL/kg) 200 (4.1)    Net +200         Stool Occurrence 1 x      LABS  No results found for this or any previous visit (from the past 24 hour(s)).   PHYSICAL EXAM:   Gen: awake, in bed, sister at bedside  Ext:       Left Lower extremity              Dressing L hip clean, dry and intact             Knee immobilizer in place and was removed by me again             Chronic appearing L knee effusion             Distal motor and sensory functions grossly intact             No DCT              Compartments are soft   Somewhat histrionic with manipulation of L leg   Assessment/Plan: 4 Days Post-Op   Active Problems:   ESRD on hemodialysis (HCC)   Schizoaffective disorder, bipolar type (HCC)   Hip fracture, left (HCC)   Malnutrition of moderate degree   Anti-infectives (From admission, onward)    Start     Dose/Rate Route Frequency Ordered Stop   02/28/23 1430  ceFAZolin (ANCEF) IVPB  2g/100 mL premix  Status:  Discontinued        2 g 200 mL/hr over 30 Minutes Intravenous Every 6 hours 02/28/23 1334 02/28/23 1344   02/28/23 1147  vancomycin (VANCOCIN) powder  Status:  Discontinued          As needed 02/28/23 1147 02/28/23 1216   02/28/23 0830  ceFAZolin (ANCEF) IVPB 2g/100 mL premix        2 g 200 mL/hr over 30 Minutes Intravenous On call to O.R. 02/28/23 0731 02/28/23 1033   02/28/23 0830  vancomycin (VANCOCIN) IVPB 1000 mg/200 mL premix        1,000 mg 200 mL/hr over 60 Minutes Intravenous On  call to O.R. 02/28/23 1610 02/28/23 9604     .  POD/HD#: 80  72 y/o female s/p fall with Left femoral neck fracture s/p Left hip hemiarthroplasty    -L femoral neck fracture s/p left hip hemiarthroplasty  Weightbearing WBAT L leg with assistance               ROM/Activity                         Posterior hip precautions x 12 weeks                Wound care                         Daily wound care as needed starting    PT and OT Ice prn   - L ankle pain             Xrays negative  - L knee pain/chronic swelling  Will check new knee films    - Pain management:             Multimodal             Minimize narcotics   - ABL anemia/Hemodynamics             Monitor   - Medical issues              Per primary              - DVT/PE prophylaxis:             Currently on sq heparin  Would be inclined to do sq heparin for total of 28 days               - ID:              Periop abx completed   - Metabolic Bone Disease:             Due to renal dz   - Dispo:             Therapies             Snf once cleared by therapies             Follow up with ortho 2 weeks   Madison Latin, PA-C [phone] (C) 03/04/2023, 11:25 AM  [facility] [address] [place] [address] [phone] [phone] (F)    After 5pm and on the weekends please log on to Amion, go to orthopaedics and the look under the [facility]  Call for the provider(s) on call. You can also call our office at [phone] and then follow the prompts to be connected to the call team.  Patient ID: Madison [name], female   DOB: [birth year], 72 y.o.   MRN: [id number]

## 2023-03-04 NOTE — TOC Initial Note (Signed)
Transition of Care [facility]) - Initial/Assessment Note    Patient Details  Name: Madison [name] MRN: [id number] Date of Birth: November 06, 1950  Transition of Care [facility]) CM/SW Contact:    Lorri Frederick, LCSW Phone Number: 03/04/2023, 2:26 PM  Clinical Narrative:   Pt oriented x1, CSW spoke with pt sister Meriam Sprague, who informed CSW that she is legal guardian of pt.  Copy of guardianship order obtained and placed on chart.  Pt currently in HD, [place] in room alone.  Pt is from [facility], where she has been in long term care.  Pt receives HD at [facility] MWF.  Meriam Sprague concerned about pt returning to [place] as she was not informed of pt potential injury when it occurred and it then took too long for pt to receive medical care.  [facility] requesting new placement.  We discussed that LTC and HD can make new placement difficult.     Referral sent out in hub.                 Expected Discharge Plan: Skilled Nursing Facility Barriers to Discharge: Continued Medical Work up, SNF Pending bed offer   Patient Goals and CMS Choice     Choice offered to / list presented to : [facility] POA / Guardian (sister/legal guardian Meriam Sprague)      Expected Discharge Plan and Services In-house Referral: Clinical Social Work   Post Acute Care Choice: Skilled Nursing Facility Living arrangements for the past 2 months: Skilled Nursing Facility ([facility] Long term care)                                      Prior Living Arrangements/Services Living arrangements for the past 2 months: Skilled Nursing Facility ([facility] Long term care) Lives with:: Facility Resident Patient language and need for interpreter reviewed:: No        Need for Family Participation in Patient Care: Yes (Comment) Care giver support system in place?: Yes (comment) Current home services: Other (comment) (na) Criminal Activity/Legal Involvement Pertinent to Current Situation/Hospitalization: No - Comment as  needed  Activities of Daily Living      Permission Sought/Granted                  Emotional Assessment       Orientation: : Oriented to Self      Admission diagnosis:  Femoral neck fracture (HCC) [S72.009A] Patient Active Problem List   Diagnosis Date Noted   Malnutrition of moderate degree 02/28/2023   Hip fracture, left (HCC) 02/26/2023   Femoral neck fracture (HCC) 02/26/2023   SIRS (systemic inflammatory response syndrome) (HCC) 12/26/2022   Murmur, cardiac 09/20/2022   Encounter for screening fecal occult blood testing 10/31/2021   Encounter for gynecological examination 10/31/2021   Acute metabolic encephalopathy 04/06/2020   Rhabdomyolysis 04/06/2020   Elevated troponin 04/06/2020   Pneumonia 04/06/2020   Lactic acidosis 04/06/2020   Suspected Sepsis (HCC) 04/06/2020   Possible NSTEMI (non-ST elevated myocardial infarction) (HCC) 04/06/2020    Suspected Overdose, undetermined intent, initial encounter 04/06/2020   Schizoaffective disorder, bipolar type (HCC)    Bipolar affective disorder, current episode manic with psychotic symptoms (HCC) 12/25/2018   ESRD on hemodialysis (HCC) 12/25/2018   History of adenomatous polyp of colon 11/20/2018   Loss of weight 10/17/2017   Hypernatremia 04/17/2016   CKD (chronic kidney disease) stage 3, GFR 30-59 ml/min (HCC) 04/16/2016  Normocytic anemia 04/16/2016   Meningioma (HCC) 04/16/2016   Acute psychosis (HCC) 04/15/2016   Hyponatremia 04/15/2016   AKI (acute kidney injury) (HCC) 04/15/2016   Special screening for malignant neoplasms, colon    Thrombocytopenia (HCC) 01/05/2016   Altered mental status 04/19/2015   PCP:  Charlynne Pander, MD Pharmacy:   [facility] - [place], [place] - [address] [place] [address] Phone: [phone] Fax: [phone]  [facility] [address] [place] [address] Phone: [phone] Fax:  [phone]  [facility] [place], [place] - [address] [place] [address] Phone: [phone] Fax: [phone]     Social Determinants of Health (SDOH) Social History: SDOH Screenings   Alcohol Screen: Low Risk  (12/25/2018)  Tobacco Use: Low Risk  (03/04/2023)   SDOH Interventions:     Readmission Risk Interventions    12/27/2022    8:27 AM  Readmission Risk Prevention Plan  Transportation Screening Complete  HRI or Home Care Consult Complete  Social Work Consult for Recovery Care Planning/Counseling Complete  Palliative Care Screening Not Applicable  Medication Review Ocean[facility]) Complete

## 2023-03-04 NOTE — Progress Notes (Signed)
   Scheduled for IR procedure today  FULL Code status per chart

## 2023-03-04 NOTE — Care Management Important Message (Signed)
Important Message  Patient Details  Name: Madison [name] MRN: [id number] Date of Birth: 1950/12/21   Medicare Important Message Given:  Yes     Sherilyn Banker 03/04/2023, 2:51 PM

## 2023-03-04 NOTE — Progress Notes (Signed)
PT Cancellation Note  Patient Details Name: Madison [name] MRN: [id number] DOB: 09/23/1951   Cancelled Treatment:    Reason Eval/Treat Not Completed: Patient at procedure or test/unavailable  Underwent procedure with IR this morning for fistula. Off unit again this afternoon, now at HD. Will continue to progress with further acute PT, most likely tomorrow.  Kathlyn Sacramento, PT, DPT [facility]  Rehabilitation Services Physical Therapist Office: [phone] Website: [email or web address]  Berton Mount 03/04/2023, 3:31 PM

## 2023-03-04 NOTE — NC FL2 (Addendum)
[place] MEDICAID FL2 LEVEL OF CARE FORM     IDENTIFICATION  Patient Name: Madison [name] Birthdate: September 07, 1951 Sex: female Admission Date (Current Location): 02/26/2023  [place] and [place] Number:  Haynes Bast [id number] T Facility and Address:  [facility], [address], [place], [place] [address]      Provider Number: [id number]  Attending Physician Name and Address:  Lonia Blood, MD  Relative Name and Phone Number:  [place],Beverly Legal Guardian   [phone]    Current Level of Care: Hospital Recommended Level of Care: Skilled Nursing Facility Prior Approval Number:    Date Approved/Denied:   PASRR Number: [id number]  Discharge Plan: SNF    Current Diagnoses: Patient Active Problem List   Diagnosis Date Noted   Malnutrition of moderate degree 02/28/2023   Hip fracture, left (HCC) 02/26/2023   Femoral neck fracture (HCC) 02/26/2023   SIRS (systemic inflammatory response syndrome) (HCC) 12/26/2022   Murmur, cardiac 09/20/2022   Encounter for screening fecal occult blood testing 10/31/2021   Encounter for gynecological examination 10/31/2021   Acute metabolic encephalopathy 04/06/2020   Rhabdomyolysis 04/06/2020   Elevated troponin 04/06/2020   Pneumonia 04/06/2020   Lactic acidosis 04/06/2020   Suspected Sepsis (HCC) 04/06/2020   Possible NSTEMI (non-ST elevated myocardial infarction) (HCC) 04/06/2020    Suspected Overdose, undetermined intent, initial encounter 04/06/2020   Schizoaffective disorder, bipolar type (HCC)    Bipolar affective disorder, current episode manic with psychotic symptoms (HCC) 12/25/2018   ESRD on hemodialysis (HCC) 12/25/2018   History of adenomatous polyp of colon 11/20/2018   Loss of weight 10/17/2017   Hypernatremia 04/17/2016   CKD (chronic kidney disease) stage 3, GFR 30-59 ml/min (HCC) 04/16/2016   Normocytic anemia 04/16/2016   Meningioma (HCC) 04/16/2016   Acute psychosis (HCC) 04/15/2016    Hyponatremia 04/15/2016   AKI (acute kidney injury) (HCC) 04/15/2016   Special screening for malignant neoplasms, colon    Thrombocytopenia (HCC) 01/05/2016   Altered mental status 04/19/2015    Orientation RESPIRATION BLADDER Height & Weight     Self  Normal Continent Weight: 108 lb 14.5 oz (49.4 kg) (bed) Height:  5\' 4"  (162.6 cm)  BEHAVIORAL SYMPTOMS/MOOD NEUROLOGICAL BOWEL NUTRITION STATUS      Incontinent Diet (see discharge summary)  AMBULATORY STATUS COMMUNICATION OF NEEDS Skin   Total Care Verbally Surgical wounds                       Personal Care Assistance Level of Assistance  Bathing, Feeding, Dressing, Total care Bathing Assistance: Maximum assistance Feeding assistance: Limited assistance Dressing Assistance: Maximum assistance Total Care Assistance: Maximum assistance   Functional Limitations Info  Sight, Hearing, Speech Sight Info: Adequate Hearing Info: Adequate Speech Info: Adequate    SPECIAL CARE FACTORS FREQUENCY  PT (By licensed PT), OT (By licensed OT)     PT Frequency: 5x week OT Frequency: 5x week            Contractures Contractures Info: Not present    Additional Factors Info  Code Status, Allergies Code Status Info: full Allergies Info: NKA           Current Medications (03/04/2023):  This is the current hospital active medication list Current Facility-Administered Medications  Medication Dose Route Frequency Provider Last Rate Last Admin   acetaminophen (TYLENOL) tablet 650 mg  650 mg Oral TID Montez Morita, PA-C   650 mg at 03/04/23 0942   ARIPiprazole (ABILIFY) tablet 15 mg  15  mg Oral Q dialysis Montez Morita, PA-C       benztropine (COGENTIN) tablet 0.5 mg  0.5 mg Oral BID Montez Morita, PA-C   0.5 mg at 03/04/23 1610   Chlorhexidine Gluconate Cloth 2 % PADS 6 each  6 each Topical Q0600 Montez Morita, PA-C   6 each at 03/04/23 9604   Darbepoetin Alfa (ARANESP) injection 60 mcg  60 mcg Subcutaneous Q Mon-1800 Dagoberto Ligas, MD        docusate sodium (COLACE) capsule 100 mg  100 mg Oral BID Montez Morita, PA-C   100 mg at 03/04/23 5409   feeding supplement (NEPRO CARB STEADY) liquid 237 mL  237 mL Oral BID BM Lonia Blood, MD   237 mL at 03/04/23 0952   fentaNYL (SUBLIMAZE) injection 12.5 mcg  12.5 mcg Intravenous Q2H PRN Montez Morita, PA-C   12.5 mcg at 03/02/23 1757   haloperidol lactate (HALDOL) injection 1-2 mg  1-2 mg Intravenous Q6H PRN Lonia Blood, MD   1 mg at 03/02/23 1022   heparin injection 5,000 Units  5,000 Units Subcutaneous Q8H Montez Morita, PA-C   5,000 Units at 03/04/23 8119   menthol-cetylpyridinium (CEPACOL) lozenge 3 mg  1 lozenge Oral PRN Montez Morita, PA-C       Or   phenol (CHLORASEPTIC) mouth spray 1 spray  1 spray Mouth/Throat PRN Montez Morita, PA-C       mirtazapine (REMERON) tablet 15 mg  15 mg Oral QHS Montez Morita, PA-C   15 mg at 03/03/23 2100   multivitamin (RENA-VIT) tablet 1 tablet  1 tablet Oral QHS Lonia Blood, MD   1 tablet at 03/03/23 2101   ondansetron (ZOFRAN) tablet 4 mg  4 mg Oral Q6H PRN Montez Morita, PA-C       Or   ondansetron [facility]) injection 4 mg  4 mg Intravenous Q6H PRN Montez Morita, PA-C       oxyCODONE (Oxy IR/ROXICODONE) immediate release tablet 5-10 mg  5-10 mg Oral Q4H PRN Montez Morita, PA-C   10 mg at 03/03/23 2100   pantoprazole (PROTONIX) EC tablet 40 mg  40 mg Oral Daily Montez Morita, PA-C   40 mg at 03/04/23 1478   senna (SENOKOT) tablet 8.6 mg  1 tablet Oral BID Montez Morita, PA-C   8.6 mg at 03/04/23 2956   sevelamer carbonate (RENVELA) tablet 1,600 mg  1,600 mg Oral TID WC Dagoberto Ligas, MD       valproic acid (DEPAKENE) 250 MG/5ML solution 250 mg  250 mg Oral TID WC Montez Morita, PA-C   250 mg at 03/04/23 2130     Discharge Medications: Please see discharge summary for a list of discharge medications.  Relevant Imaging Results:  Relevant Lab Results:   Additional Information ss# [id number]  Patient has Diaylsis MWF at [facility].   This is referral for both short term rehab and long term care afterwards.  Lorri Frederick, LCSW

## 2023-03-04 NOTE — Progress Notes (Signed)
Pt receives out-pt HD at [facility] on MWF. Pt arrives at 6:10 am for 6:30 am chair time. Will assist as needed.   Olivia Canter Renal Navigator [phone]

## 2023-03-04 NOTE — Progress Notes (Signed)
   03/04/23 1825  Assess: MEWS Score  Temp 98.1 F (36.7 C)  BP (!) 94/51  MAP (mmHg) (!) 64  Pulse Rate (!) 109  Resp 18  SpO2 99 %  Assess: MEWS Score  MEWS Temp 0  MEWS Systolic 1  MEWS Pulse 1  MEWS RR 0  MEWS LOC 0  MEWS Score 2  MEWS Score Color Yellow  Assess: if the MEWS score is Yellow or Red  Were vital signs taken at a resting state? Yes  Focused Assessment No change from prior assessment  Does the patient meet 2 or more of the SIRS criteria? No  MEWS guidelines implemented  Yes, yellow  Treat  MEWS Interventions Considered administering scheduled or prn medications/treatments as ordered  Take Vital Signs  Increase Vital Sign Frequency  Yellow: Q2hr x1, continue Q4hrs until patient remains green for 12hrs  Escalate  MEWS: Escalate Yellow: Discuss with charge nurse and consider notifying provider and/or RRT  Notify: Charge Nurse/RN  Name of Charge Nurse/RN Notified Lauren, RN  Assess: SIRS CRITERIA  SIRS Temperature  0  SIRS Pulse 1  SIRS Respirations  0  SIRS WBC 0  SIRS Score Sum  1   Patient coming back from HD and in pain due to being moved

## 2023-03-04 NOTE — Progress Notes (Signed)
Madison [name]  WUJ:[id number] DOB: 09-15-1951 DOA: 02/26/2023 PCP: Charlynne Pander, MD    Brief Narrative:  72 year old SNF resident with a history of ESRD, HTN, and schizophrenia versus bipolar disorder who presented to [facility] ER after a fall which occurred multiple days prior.  X-rays at the [facility] ER revealed a left femoral fracture.  The patient was transferred to [facility] as she needed a combination of an Orthopedist as well as access to inpatient dialysis.  Consultants:  Orthopedic Surgery  Goals of Care:  Code Status: Full Code   DVT prophylaxis: Subcutaneous heparin  Interim Hx: Underwent successful PTA of high-grade brachial vein stenosis this morning.  No other acute events recorded overnight.  Afebrile.  Vital signs stable.  Patient was examined in the dialysis unit.  She appears stable at time of my exam.   Assessment & Plan:  Left femoral neck fracture Care per Orthopedic Surgery - s/p unipolar hemiarthroplasty of the left hip 02/28/2023 -appears to be stable from an orthopedic standpoint -cleared for discharge by Orthopedics  Schizoaffective disorder, bipolar type Continue usual home medical regimen -appears well compensated at present - utilize as needed Haldol for agitation  ESRD on HD MWF Nephrology attending to dialysis -ongoing issues with dialysis cannulation now corrected status post PTA to the left brachial vein in IR 03/04/2023  Anemia of chronic kidney disease compounded by postoperative anemia Hemoglobin has stabilized at approximately 9 - no evidence of gross blood loss  Malnutrition of moderate degree  related to chronic illness (ESRD, schizoaffective disorder) as evidenced by moderate fat depletion, severe fat depletion, moderate muscle depletion   Family Communication: No family present at time of exam today in the HD unit  Disposition: medically stable for discharge to SNF for rehab stay   Objective: Blood pressure 139/65, pulse 96, temperature  99.2 F (37.3 C), resp. rate 16, height 5\' 4"  (1.626 m), weight 49.1 kg, SpO2 94 %.  Intake/Output Summary (Last 24 hours) at 03/04/2023 1023 Last data filed at 03/03/2023 1500 Gross per 24 hour  Intake 100 ml  Output --  Net 100 ml    Filed Weights   02/28/23 0750 03/01/23 0758 03/01/23 1345  Weight: 48.6 kg 50.8 kg 49.1 kg    Examination: General: No acute respiratory distress Lungs: Clear to auscultation bilaterally  Cardiovascular: Regular rate and rhythm without murmur Abdomen: Nontender, nondistended, soft, bowel sounds positive Extremities: No significant edema B LE  CBC: Recent Labs  Lab 03/01/23 0809 03/02/23 0109 03/03/23 0135  WBC 8.1 8.9 7.1  HGB 9.0* 9.1* 9.6*  HCT 28.0* 28.1* 29.3*  MCV 92.7 95.6 93.6  PLT 281 247 242    Basic Metabolic Panel: Recent Labs  Lab 02/28/23 1639 03/01/23 0809 03/03/23 0135  NA 133* 134* 137  K 5.6* 4.9 5.0  CL 93* 93* 93*  CO2 23 22 25   GLUCOSE 150* 107* 85  BUN 46* 61* 47*  CREATININE 7.79* 9.11* 7.36*  CALCIUM 9.3 9.5 10.0  PHOS 7.5* 7.8* 6.1*    GFR: Estimated Creatinine Clearance: 5.3 mL/min (A) (by C-G formula based on SCr of 7.36 mg/dL (H)).   Scheduled Meds:  acetaminophen  650 mg Oral TID   benztropine  0.5 mg Oral BID   Chlorhexidine Gluconate Cloth  6 each Topical Q0600   docusate sodium  100 mg Oral BID   feeding supplement (NEPRO CARB STEADY)  237 mL Oral BID BM   heparin  5,000 Units Subcutaneous Q8H   mirtazapine  15 mg Oral QHS   multivitamin  1 tablet Oral QHS   pantoprazole  40 mg Oral Daily   senna  1 tablet Oral BID   sevelamer carbonate  800 mg Oral TID WC   valproic acid  250 mg Oral TID WC     LOS: 6 days   Lonia Blood, MD [facility] Office  [phone] Pager - Text Page per Loretha Stapler  If 7PM-7AM, please contact night-coverage per Amion 03/04/2023, 10:23 AM

## 2023-03-04 NOTE — Procedures (Signed)
Interventional Radiology Procedure Note  Procedure:   US guided access left UE fistula. Treatment of brachial vein outflow stenosis to 14mm.   Findings: High grade stenosis of brachial vein. Complete resolution after 14mm PTA treatment.  Excellent thrill on completion.  .  Complications: None  Recommendations:  - Ok to use - Do not submerge for 7 days - Routine wound care - OK to advance diet per primary order   Signed,  Yvone Neu. Loreta Ave, DO

## 2023-03-04 NOTE — Progress Notes (Signed)
Patient ID: Madison [name], female   DOB: March 07, 1951, 72 y.o.   MRN: [id number] [facility] Progress Note   Assessment/ Plan:   1.  Status post hemiarthroplasty for left hip fracture: Postoperative day #4 and awaiting outpatient placement to skilled nursing facility along with recommendations by orthopedic surgery. 2. ESRD: Continue hemodialysis on a Monday/Wednesday/Friday schedule with dialysis planned for today.  She underwent fistulogram earlier with angioplasty of outflow brachial vein. 3. Anemia: Hemoglobin and hematocrit are low as expected postoperatively, continue to monitor with ESA. 4. CKD-MBD: Elevated phosphorus level noted, will increase sevelamer dose.  Calcium level acceptable. 5. Nutrition: Continue renal diet/nutritional supplementation. 6. Hypertension: Blood pressure marginally elevated, monitor with hemodialysis/UF.  Subjective:   Underwent fistulogram earlier today with 14 mm angioplasty of outflow brachial vein stenosis.  Without acute events overnight.   Objective:   BP 139/65   Pulse 96   Temp 99.2 F (37.3 C)   Resp 16   Ht 5\' 4"  (1.626 m)   Wt 49.1 kg   SpO2 94%   BMI 18.58 kg/m   Physical Exam: Gen: Appears comfortable resting in bed and cries out in anticipation of pain on physical exam CVS: Pulse regular rhythm, normal rate, S1 and S2 normal Resp: Clear to auscultation bilaterally, no distinct rales or rhonchi Abd: Soft, flat, nontender, bowel sounds normal Ext: No lower extremity edema, left upper arm fistula with intact dressing  Labs: BMET Recent Labs  Lab 02/26/23 2044 02/27/23 0650 02/28/23 0758 02/28/23 1639 03/01/23 0809 03/03/23 0135  NA 138 136 133* 133* 134* 137  K 6.4* 5.4* 5.0 5.6* 4.9 5.0  CL 98 98 96* 93* 93* 93*  CO2 22 18*  --  23 22 25   GLUCOSE 76 68* 96 150* 107* 85  BUN 83* 83* 43* 46* 61* 47*  CREATININE 12.35* 11.90* 7.40* 7.79* 9.11* 7.36*  CALCIUM 9.7 8.6*  --  9.3 9.5 10.0  PHOS  --  6.6*  --  7.5*  7.8* 6.1*   CBC Recent Labs  Lab 02/27/23 0650 02/28/23 0758 03/01/23 0809 03/02/23 0109 03/03/23 0135  WBC 5.8  --  8.1 8.9 7.1  HGB 9.7* 11.6* 9.0* 9.1* 9.6*  HCT 30.9* 34.0* 28.0* 28.1* 29.3*  MCV 94.8  --  92.7 95.6 93.6  PLT 279  --  281 247 242      Medications:     acetaminophen  650 mg Oral TID   benztropine  0.5 mg Oral BID   Chlorhexidine Gluconate Cloth  6 each Topical Q0600   docusate sodium  100 mg Oral BID   feeding supplement (NEPRO CARB STEADY)  237 mL Oral BID BM   heparin  5,000 Units Subcutaneous Q8H   mirtazapine  15 mg Oral QHS   multivitamin  1 tablet Oral QHS   pantoprazole  40 mg Oral Daily   senna  1 tablet Oral BID   sevelamer carbonate  800 mg Oral TID WC   valproic acid  250 mg Oral TID WC   Zetta Bills, MD 03/04/2023, 11:27 AM

## 2023-03-04 NOTE — Progress Notes (Signed)
   03/04/23 1500  During Treatment Monitoring  Intra-Hemodialysis Comments  (tx paused cartridge clotted blood returned new cartidge hung flush increased to every 15)

## 2023-03-05 DIAGNOSIS — F250 Schizoaffective disorder, bipolar type: Secondary | ICD-10-CM | POA: Diagnosis not present

## 2023-03-05 DIAGNOSIS — Z992 Dependence on renal dialysis: Secondary | ICD-10-CM | POA: Diagnosis not present

## 2023-03-05 DIAGNOSIS — E440 Moderate protein-calorie malnutrition: Secondary | ICD-10-CM | POA: Diagnosis not present

## 2023-03-05 DIAGNOSIS — N186 End stage renal disease: Secondary | ICD-10-CM | POA: Diagnosis not present

## 2023-03-05 NOTE — Plan of Care (Signed)
  Problem: Clinical Measurements: Goal: Respiratory complications will improve Outcome: Progressing   Problem: Activity: Goal: Risk for activity intolerance will decrease Outcome: Not Progressing   Problem: Nutrition: Goal: Adequate nutrition will be maintained Outcome: Not Progressing   

## 2023-03-05 NOTE — Plan of Care (Signed)
  Problem: Clinical Measurements: Goal: Ability to maintain clinical measurements within normal limits will improve Outcome: Progressing Goal: Will remain free from infection Outcome: Progressing Goal: Diagnostic test results will improve Outcome: Progressing   Problem: Activity: Goal: Risk for activity intolerance will decrease Outcome: Progressing   Problem: Nutrition: Goal: Adequate nutrition will be maintained Outcome: Progressing   

## 2023-03-05 NOTE — Progress Notes (Signed)
Patient ID: Madison [name], female   DOB: 11-06-1950, 72 y.o.   MRN: [id number] [facility] Progress Note   Assessment/ Plan:   1.  Status post hemiarthroplasty for left hip fracture: Postoperative day #5 with ongoing orthopedic follow-up, getting physical therapy and awaiting discharge to SNF. 2. ESRD: Continue hemodialysis on a Monday/Wednesday/Friday schedule with hemodialysis again for tomorrow.  She had a fistulogram on 6/10 with angioplasty of outflow brachial vein. 3. Anemia: Hemoglobin and hematocrit are low as expected postoperatively, continue to monitor with ESA. 4. CKD-MBD: Elevated phosphorus level noted, will increase sevelamer dose.  Calcium level acceptable. 5. Nutrition: Continue renal diet/nutritional supplementation. 6. Hypertension: Blood pressure marginally elevated, monitor with hemodialysis/UF.  Subjective:   Tearful when seen in her room, easily distracted and unable to voice any complaints.   Objective:   BP (!) 152/81 (BP Location: Right Arm)   Pulse (!) 116   Temp 97.9 F (36.6 C) (Axillary)   Resp 19   Ht 5\' 4"  (1.626 m)   Wt 48.3 kg   SpO2 95%   BMI 18.28 kg/m   Physical Exam: Gen: Resting in bed and intermittently crying CVS: Pulse regular rhythm, normal rate, S1 and S2 normal Resp: Clear to auscultation bilaterally, no distinct rales or rhonchi Abd: Soft, flat, nontender, bowel sounds normal Ext: No lower extremity edema, left upper arm fistula with intact dressings  Labs: BMET Recent Labs  Lab 02/26/23 2044 02/27/23 0650 02/28/23 0758 02/28/23 1639 03/01/23 0809 03/03/23 0135  NA 138 136 133* 133* 134* 137  K 6.4* 5.4* 5.0 5.6* 4.9 5.0  CL 98 98 96* 93* 93* 93*  CO2 22 18*  --  23 22 25   GLUCOSE 76 68* 96 150* 107* 85  BUN 83* 83* 43* 46* 61* 47*  CREATININE 12.35* 11.90* 7.40* 7.79* 9.11* 7.36*  CALCIUM 9.7 8.6*  --  9.3 9.5 10.0  PHOS  --  6.6*  --  7.5* 7.8* 6.1*   CBC Recent Labs  Lab 02/27/23 0650  02/28/23 0758 03/01/23 0809 03/02/23 0109 03/03/23 0135  WBC 5.8  --  8.1 8.9 7.1  HGB 9.7* 11.6* 9.0* 9.1* 9.6*  HCT 30.9* 34.0* 28.0* 28.1* 29.3*  MCV 94.8  --  92.7 95.6 93.6  PLT 279  --  281 247 242      Medications:     acetaminophen  650 mg Oral TID   benztropine  0.5 mg Oral BID   Chlorhexidine Gluconate Cloth  6 each Topical Q0600   darbepoetin (ARANESP) injection - NON-DIALYSIS  60 mcg Subcutaneous Q Mon-1800   docusate sodium  100 mg Oral BID   feeding supplement (NEPRO CARB STEADY)  237 mL Oral BID BM   heparin  5,000 Units Subcutaneous Q8H   mirtazapine  15 mg Oral QHS   multivitamin  1 tablet Oral QHS   pantoprazole  40 mg Oral Daily   senna  1 tablet Oral BID   sevelamer carbonate  1,600 mg Oral TID WC   valproic acid  250 mg Oral TID WC   Zetta Bills, MD 03/05/2023, 10:17 AM

## 2023-03-05 NOTE — Progress Notes (Signed)
Madison [name]  ZOX:[id number] DOB: 07-Apr-1951 DOA: 02/26/2023 PCP: Charlynne Pander, MD    Brief Narrative:  72 year old SNF resident with a history of ESRD, HTN, and schizophrenia versus bipolar disorder who presented to [facility] ER after a fall which occurred multiple days prior.  X-rays at the [facility] ER revealed a left femoral fracture.  The patient was transferred to [facility] as she needed a combination of an Orthopedist as well as access to inpatient dialysis.  Consultants:  Orthopedic Surgery  Goals of Care:  Code Status: Full Code   DVT prophylaxis: Subcutaneous heparin  Interim Hx: Afebrile.  Mild tachycardia.  Blood pressure modestly elevated.  Saturations 95% on room air.  Patient is medically stable for discharge, but family wishes to pursue placement in a different SNF.  Assessment & Plan:  Left femoral neck fracture Care per Orthopedic Surgery - s/p unipolar hemiarthroplasty of the left hip 02/28/2023 -appears to be stable from an orthopedic standpoint - cleared for discharge by Orthopedics, with recs as follows:  Weightbearing WBAT L leg with assistance ROM/Activity Posterior hip precautions x 12 weeks  Wound care Daily wound care as needed starting  DVT/PE prophylaxis SQ heparin for total of 28 days               Schizoaffective disorder, bipolar type Continue usual home medical regimen - appears well compensated at present - utilize as needed Haldol for agitation  ESRD on HD MWF Nephrology attending to dialysis -ongoing issues with dialysis cannulation now corrected status post PTA to the left brachial vein in IR 03/04/2023  Anemia of chronic kidney disease compounded by postoperative anemia Hemoglobin has stabilized at approximately 9 - no evidence of gross blood loss  Malnutrition of moderate degree  related to chronic illness (ESRD, schizoaffective disorder) as evidenced by moderate fat depletion, severe fat depletion, moderate muscle depletion  Family  Communication: No family present at time of exam today Disposition: medically stable for discharge to SNF for rehab stay   Objective: Blood pressure (!) 152/81, pulse (!) 116, temperature 97.9 F (36.6 C), temperature source Axillary, resp. rate 19, height 5\' 4"  (1.626 m), weight 48.3 kg, SpO2 95 %.  Intake/Output Summary (Last 24 hours) at 03/05/2023 1025 Last data filed at 03/04/2023 1748 Gross per 24 hour  Intake 100 ml  Output 1100 ml  Net -1000 ml    Filed Weights   03/04/23 1213 03/04/23 1726 03/05/23 0426  Weight: 49.4 kg 48.3 kg 48.3 kg    Examination: General: No acute respiratory distress Lungs: Clear to auscultation bilaterally  Cardiovascular: Regular rate and rhythm  Abdomen: Nontender, nondistended, soft, bowel sounds positive Extremities: No significant edema B LE  CBC: Recent Labs  Lab 03/01/23 0809 03/02/23 0109 03/03/23 0135  WBC 8.1 8.9 7.1  HGB 9.0* 9.1* 9.6*  HCT 28.0* 28.1* 29.3*  MCV 92.7 95.6 93.6  PLT 281 247 242    Basic Metabolic Panel: Recent Labs  Lab 02/28/23 1639 03/01/23 0809 03/03/23 0135  NA 133* 134* 137  K 5.6* 4.9 5.0  CL 93* 93* 93*  CO2 23 22 25   GLUCOSE 150* 107* 85  BUN 46* 61* 47*  CREATININE 7.79* 9.11* 7.36*  CALCIUM 9.3 9.5 10.0  PHOS 7.5* 7.8* 6.1*    GFR: Estimated Creatinine Clearance: 5.2 mL/min (A) (by C-G formula based on SCr of 7.36 mg/dL (H)).   Scheduled Meds:  acetaminophen  650 mg Oral TID   benztropine  0.5 mg Oral BID  Chlorhexidine Gluconate Cloth  6 each Topical Q0600   darbepoetin (ARANESP) injection - NON-DIALYSIS  60 mcg Subcutaneous Q Mon-1800   docusate sodium  100 mg Oral BID   feeding supplement (NEPRO CARB STEADY)  237 mL Oral BID BM   heparin  5,000 Units Subcutaneous Q8H   mirtazapine  15 mg Oral QHS   multivitamin  1 tablet Oral QHS   pantoprazole  40 mg Oral Daily   senna  1 tablet Oral BID   sevelamer carbonate  1,600 mg Oral TID WC   valproic acid  250 mg Oral TID WC      LOS: 7 days   Lonia Blood, MD [facility] Office  [phone] Pager - Text Page per Loretha Stapler  If 7PM-7AM, please contact night-coverage per Amion 03/05/2023, 10:25 AM

## 2023-03-05 NOTE — TOC Progression Note (Addendum)
Transition of Care [facility]) - Progression Note    Patient Details  Name: Madison [name] MRN: [id number] Date of Birth: Apr 11, 1951  Transition of Care [facility] CM/SW Contact  Lorri Frederick, LCSW Phone Number: 03/05/2023, 12:05 PM  Clinical Narrative:   Pt has several SNF bed offers, legal guardian/sister not in room.  CSW spoke with pt.  Attempted to call sister, left message.  1315: TC [place]/sister/guardian.  Bed offers provided.  She is not coming to the hospital today, does not want to access medicare.gov online.  Medicare choice document placed in room and CSW gave her ratings info verbally.  She will talk with siblings about these options.  Discussed [facility]--she never received call back from administrator with information about what happened and why care was delayed, she would like to talk with them about this, stated she has not closed the door on a possible return there if that is best facility.    CSW reached out to [place] at the facility and she will follow up with Product/process development scientist.      Expected Discharge Plan: Skilled Nursing Facility Barriers to Discharge: Continued Medical Work up, SNF Pending bed offer  Expected Discharge Plan and Services In-house Referral: Clinical Social Work   Post Acute Care Choice: Skilled Nursing Facility Living arrangements for the past 2 months: Skilled Nursing Facility ([facility])                                       Social Determinants of Health (SDOH) Interventions SDOH Screenings   Alcohol Screen: Low Risk  (12/25/2018)  Tobacco Use: Low Risk  (03/04/2023)    Readmission Risk Interventions    12/27/2022    8:27 AM  Readmission Risk Prevention Plan  Transportation Screening Complete  HRI or Home Care Consult Complete  Social Work Consult for Recovery Care Planning/Counseling Complete  Palliative Care Screening Not Applicable  Medication Review Oceanographer) Complete

## 2023-03-05 NOTE — Progress Notes (Signed)
Physical Therapy Treatment Patient Details Name: Madison [name] MRN: [id number] DOB: [birth year] Today's Date: 03/05/2023   History of Present Illness 72 y.o. female admitted 6/4 for fall and hip pain, s/p UNIPOLAR HEMIARTHROPLASTY OF THE LEFT HIP on 6/6. PMHx: history of ESRD, HTN, and schizophrenia versus bipolar disorder.    PT Comments    Limited participation, more agitated today but tolerated sitting EOB for approx 10 min. Progressed with attempt to stand using stedy however poorly tolerated. Great difficulty following simple commands. Needs frequent redirection and to be consoled. Perseverating on how she was treated as a child and asking to be treated as a lady. Efforts to console very limited and she was periodically tearful during session, anxious to any movement but stating she wants help to get out of the [hospital]. Max-total assist +2 with all mobility. No recall of precautions. Used pillow between knees to block internal rotation and adduction which her LLE seems to have the natural tendency to draw into. Patient will continue to benefit from skilled physical therapy services to further improve independence with functional mobility.   Recommendations for follow up therapy are one component of a multi-disciplinary discharge planning process, led by the attending physician.  Recommendations may be updated based on patient status, additional functional criteria and insurance authorization.  Follow Up Recommendations  Can patient physically be transported by private vehicle: No    Assistance Recommended at Discharge Frequent or constant Supervision/Assistance  Patient can return home with the following Two people to help with walking and/or transfers;Two people to help with bathing/dressing/bathroom;Assistance with cooking/housework;Assist for transportation;Help with stairs or ramp for entrance   Equipment Recommendations  None recommended by PT    Recommendations for Other  Services       Precautions / Restrictions Precautions Precautions: Posterior Hip (left) Precaution Booklet Issued: Yes (comment) Precaution Comments: Reviewed with pt Required Braces or Orthoses: Knee Immobilizer - Left Knee Immobilizer - Left: Other (comment) (Order for KI until pt demonstrates compliance with hip precautions per order - however note from Montez Morita, PA indicates no longer supposed to wear KI and was discarded.) Restrictions Weight Bearing Restrictions: Yes LLE Weight Bearing: Weight bearing as tolerated     Mobility  Bed Mobility Overal bed mobility: Needs Assistance Bed Mobility: Rolling, Sidelying to Sit, Sit to Sidelying Rolling: Max assist, +2 for physical assistance Sidelying to sit: Total assist, +2 for physical assistance, HOB elevated     Sit to sidelying: Total assist, +2 for physical assistance General bed mobility comments: Requires constant redirection to participate. Pt agreeable to get EOB but then screams with any movement of either LE or trunk. Max-total assist +2 to achieve EOB. Consoled once seated. leaning slightly to Rt requires min assist to stabilize. Cues throughout for sequencing.    Transfers Overall transfer level: Needs assistance Equipment used: Ambulation equipment used Transfers: Sit to/from Stand Sit to Stand: Total assist, +2 physical assistance, From elevated surface           General transfer comment: Attempted sit<>stand with good LE placement while maintaining hip precautions. Pt screams during attempt and could not stand fully upright. She did engage her legs as we assisted back to seated position but not in accordance with instructions. Relaxes quickly with distraction and sitting back on EOB. Seems fearful with all movement.    Ambulation/Gait                   Stairs  Wheelchair Mobility    Modified Rankin (Stroke Patients Only)       Balance Overall balance assessment: Needs  assistance Sitting-balance support: Single extremity supported, Feet supported Sitting balance-Leahy Scale: Poor   Postural control: Right lateral lean     Standing balance comment: NT                            Cognition Arousal/Alertness: Awake/alert Behavior During Therapy: Restless, Agitated, Anxious Overall Cognitive Status: History of cognitive impairments - at baseline                                 General Comments: Family feels cognition has declined past couple of months. Perseverating on being treated like a lady. Stating parents did not treat her like a lady.        Exercises      General Comments General comments (skin integrity, edema, etc.): Repositioned in bed with pillow between knees to prevent IR and adduction. KI was d/c by PA; will use other devices to maintain precautions as able.      Pertinent Vitals/Pain Pain Assessment Pain Assessment: PAINAD Breathing: occasional labored breathing, short period of hyperventilation Negative Vocalization: occasional moan/groan, low speech, negative/disapproving quality Facial Expression: sad, frightened, frown Body Language: rigid, fists clenched, knees up, pushing/pulling away, strikes out Consolability: distracted or reassured by voice/touch PAINAD Score: 6 Pain Location: L hip Pain Descriptors / Indicators: Grimacing, Guarding, Moaning Pain Intervention(s): Limited activity within patient's tolerance, Monitored during session, Repositioned, Utilized relaxation techniques    Home Living                          Prior Function            PT Goals (current goals can now be found in the care plan section) Acute Rehab PT Goals Patient Stated Goal: none stated PT Goal Formulation: With family Time For Goal Achievement: 03/15/23 Potential to Achieve Goals: Fair Progress towards PT goals: Progressing toward goals    Frequency    Min 2X/week      PT Plan Current  plan remains appropriate    Co-evaluation              AM-PAC PT "6 Clicks" Mobility   Outcome Measure  Help needed turning from your back to your side while in a flat bed without using bedrails?: Total Help needed moving from lying on your back to sitting on the side of a flat bed without using bedrails?: Total Help needed moving to and from a bed to a chair (including a wheelchair)?: Total Help needed standing up from a chair using your arms (e.g., wheelchair or bedside chair)?: Total Help needed to walk in hospital room?: Total Help needed climbing 3-5 steps with a railing? : Total 6 Click Score: 6    End of Session Equipment Utilized During Treatment: Gait belt Activity Tolerance: Patient limited by pain;Other (comment) (confusion) Patient left: with call bell/phone within reach;in bed;with bed alarm set;with nursing/sitter in room;with SCD's reapplied Nurse Communication: Need for lift equipment;Precautions;Mobility status PT Visit Diagnosis: Muscle weakness (generalized) (M62.81);History of falling (Z91.81);Difficulty in walking, not elsewhere classified (R26.2);Pain Pain - Right/Left: Left Pain - part of body: Hip     Time: 6578-4696 PT Time Calculation (min) (ACUTE ONLY): 21 min  Charges:  $Therapeutic Activity: 8-22 mins  Kathlyn Sacramento, PT, DPT [facility]  Rehabilitation Services Physical Therapist Office: [phone] Website: [email or web address]    Berton Mount 03/05/2023, 3:25 PM

## 2023-03-06 DIAGNOSIS — F250 Schizoaffective disorder, bipolar type: Secondary | ICD-10-CM | POA: Diagnosis not present

## 2023-03-06 DIAGNOSIS — E440 Moderate protein-calorie malnutrition: Secondary | ICD-10-CM | POA: Diagnosis not present

## 2023-03-06 DIAGNOSIS — N186 End stage renal disease: Secondary | ICD-10-CM | POA: Diagnosis not present

## 2023-03-06 DIAGNOSIS — S72002D Fracture of unspecified part of neck of left femur, subsequent encounter for closed fracture with routine healing: Secondary | ICD-10-CM | POA: Diagnosis not present

## 2023-03-06 LAB — RENAL FUNCTION PANEL
Albumin: 2.4 g/dL — ABNORMAL LOW (ref 3.5–5.0)
Anion gap: 19 — ABNORMAL HIGH (ref 5–15)
BUN: 53 mg/dL — ABNORMAL HIGH (ref 8–23)
CO2: 24 mmol/L (ref 22–32)
Calcium: 9.9 mg/dL (ref 8.9–10.3)
Chloride: 92 mmol/L — ABNORMAL LOW (ref 98–111)
Creatinine, Ser: 7.31 mg/dL — ABNORMAL HIGH (ref 0.44–1.00)
GFR, Estimated: 5 mL/min — ABNORMAL LOW (ref >60)
Glucose, Bld: 83 mg/dL (ref 70–99)
Phosphorus: 7 mg/dL — ABNORMAL HIGH (ref 2.5–4.6)
Potassium: 4.7 mmol/L (ref 3.5–5.1)
Sodium: 135 mmol/L (ref 135–145)

## 2023-03-06 LAB — CBC
HCT: 24.7 % — ABNORMAL LOW (ref 36.0–46.0)
Hemoglobin: 7.9 g/dL — ABNORMAL LOW (ref 12.0–15.0)
MCH: 30.2 pg (ref 26.0–34.0)
MCHC: 32 g/dL (ref 30.0–36.0)
MCV: 94.3 fL (ref 80.0–100.0)
Platelets: 355 10*3/uL (ref 150–400)
RBC: 2.62 MIL/uL — ABNORMAL LOW (ref 3.87–5.11)
RDW: 19.7 % — ABNORMAL HIGH (ref 11.5–15.5)
WBC: 9.2 10*3/uL (ref 4.0–10.5)
nRBC: 0 % (ref 0.0–0.2)

## 2023-03-06 MED ORDER — LORAZEPAM 2 MG/ML IJ SOLN
1.0000 mg | Freq: Once | INTRAMUSCULAR | Status: AC
  Administered 2023-03-06: 1 mg via INTRAVENOUS
  Filled 2023-03-06: qty 1

## 2023-03-06 MED ORDER — HALOPERIDOL LACTATE 5 MG/ML IJ SOLN
1.0000 mg | Freq: Four times a day (QID) | INTRAMUSCULAR | Status: DC | PRN
  Administered 2023-03-06: 2 mg via INTRAVENOUS
  Administered 2023-03-12: 1 mg via INTRAVENOUS
  Filled 2023-03-06 (×2): qty 1

## 2023-03-06 MED ORDER — HEPARIN SODIUM (PORCINE) 1000 UNIT/ML DIALYSIS: 40.0000 [IU]/kg | INTRAMUSCULAR | Status: DC | PRN

## 2023-03-06 NOTE — Progress Notes (Signed)
   03/06/23 1041  Vitals  Temp 98.1 F (36.7 C)  Pulse Rate (!) 111  Resp (!) 31  BP (!) 121/59  SpO2 99 %  O2 Device Room Air  Post Treatment  Dialyzer Clearance Lightly streaked  Duration of HD Treatment -hour(s) 3.25 hour(s) (due to high vp alarms)  Hemodialysis Intake (mL) 0 mL  Liters Processed 61.9  Fluid Removed (mL) 700 mL  Tolerated HD Treatment Yes  AVG/AVF Arterial Site Held (minutes) 8 minutes  AVG/AVF Venous Site Held (minutes) 8 minutes   Received patient in bed to unit.  Alert and oriented.  Informed consent signed and in chart.   TX duration:3.25hrs  Patient tolerated well.  Transported back to the room  Alert, without acute distress.  Hand-off given to patient's nurse.   Access used: LUA AVF Access issues: none  Total UF removed: Medication(s) given: none   [facility]amin[facility]

## 2023-03-06 NOTE — Procedures (Signed)
Patient seen on Hemodialysis. BP 129/78   Pulse 96   Temp 98.6 F (37 C) (Oral)   Resp 18   Ht 5\' 4"  (1.626 m)   Wt 48.3 kg   SpO2 97%   BMI 18.28 kg/m   QB 300, UF goal 1.2L Tolerating treatment without complaints at this time (intermittently tearful but easily distracted).   Zetta Bills MD [facility]. Office # [phone] Pager # [phone] 8:34 AM

## 2023-03-06 NOTE — Plan of Care (Signed)
  Problem: Clinical Measurements: Goal: Will remain free from infection Outcome: Progressing   Problem: Activity: Goal: Risk for activity intolerance will decrease Outcome: Progressing   Problem: Coping: Goal: Level of anxiety will decrease Outcome: Progressing   Problem: Pain Managment: Goal: General experience of comfort will improve Outcome: Progressing   Problem: Safety: Goal: Ability to remain free from injury will improve Outcome: Progressing   

## 2023-03-06 NOTE — Hospital Course (Addendum)
The patient is a 72 year old SNF resident with past medical history significant for but limited to ESRD on hemodialysis Monday Wednesday Friday, hypertension, history of schizophrenia versus bipolar disorder as well as other comorbidities who presented to the [facility] ER after a fall which occurred multiple days prior.  X-rays at [facility] ER revealed a left femoral fracture and she was transferred to [facility] given that she needed a combination of the orthopedist as well as inpatient dialysis.  She underwent unipolar Hemi arthroplasty of the left hip on 02/28/2023 and has not been deemed stable from a orthopedic standpoint.  She had issues with her dialysis catheter which has not been fixed and she is getting dialysis.  She was extremely agitated and hysterically crying yesterday and remains emotionally labile and tearful however was calmer.  We were anticipating discharging back to SNF in next 24 to 48 hours with resumption of her normal dialysis session on Friday, 03/08/2023 however a barrier to discharge was that the patient will need to be able to tolerate sitting in a chair for 4 to 5 hours to be appropriate for outpatient HD for discharge.  She was able to dialyze in the chair today appropriately and had no issues with that but heart rate remained elevated in the setting of pain and agitation so she was initiated on a beta-blocker and given some pain medication.   Heart rate was improved however she now has a slight leukocytosis and after repeat is trending down.  TOC attempted to call the facility and got an response so unfortunately could not discharge yesterday given that no one at the bedside picked up.  Today the case worker is informed that the patient's legal guardian was unhappy with the SNF choice and requested to change her SNF placement to [facility].  [facility] is able to take the patient at their facility as early as tomorrow however because of the symptoms changed her dialysis center  will need to be moved.  Will continue to monitor and follow-up for infectious symptoms.  Assessment and Plan:  Left Femoral Neck Fracture -Further Care per Orthopedic Surgery - s/p unipolar hemiarthroplasty of the left hip 02/28/2023  -Appears to be stable from an orthopedic standpoint they are recommending weightbearing as tolerated on the left lower extremity with assistance and posterior hip precautions for 12 weeks with daily wound care as needed and VTE prophylaxis with subcu heparin for 28 days -C/w with pain control and she is on oxycodone 5 to 10 mg every 4 as needed for moderate pain and acetaminophen 660 mg p.o. 3 times daily, and on Fentanyl 12.5 mcg every 2 hours as needed for severe pain -Continue with bowel regimen with Senokot 8.6 mg p.o. twice daily, docusate 100 mL p.o. twice daily  -DG Knee done and showed "No evidence of acute osseous abnormality. Stable degenerative change." -Given hip rotation orthopedic surgery is obtaining an x-ray and it showed "Left hip arthroplasty in expected alignment. No periprosthetic lucency or fracture. Decreasing soft tissue edema in the subcutaneous tissues. Bony pelvis is intact. Right hip is normally located." -Orthopedic surgery recommending following up in 10 days after discharge              Schizoaffective Disorder, Bipolar Type with uncontrollable crying and agitation Intermittently  -Continue usual home medical regimen and continue with aripiprazole 50 mg p.o. hemodialysis center history, benztropine 0.5 mg p.o. twice daily and continue Valproic Acid 250 mg po TID -Patient was hysterically crying and extremely agitated  and anxious today -Continue Mirtazapine 15 mg p.o. nightly -Utilize as needed Haldol for agitation and resumed yesterday   ESRD on HD MWF Hyperphosphatemia, improved with Dialysis  Elevated Anion Gap -Ongoing issues with dialysis cannulation now corrected status post PTA to the left brachial vein outflow stenosis to 14 mm  by the interventional radiology team Dr. Loreta Ave on 03/04/2023 -BUN/Cr Trend: Recent Labs  Lab 03/01/23 0809 03/03/23 0135 03/06/23 0630 03/07/23 0251 03/08/23 0523 03/09/23 0158 03/10/23 0110  BUN 61* 47* 53* 36* 58* 34* 56*  CREATININE 9.11* 7.36* 7.31* 4.73* 6.81* 4.34* 6.11*  -Phos Level Trend: Recent Labs  Lab 03/01/23 0809 03/03/23 0135 03/06/23 0630 03/07/23 0251 03/08/23 0523 03/09/23 0158 03/10/23 0110  PHOS 7.8* 6.1* 7.0* 4.3 7.5* 4.7* 5.7*  -Patient has an elevated anion gap of 16, CO2 27, chloride level 92 -Further Care per Nephrology and she needs to dialyze in the chair prior to being discharged and was dialyzed in the bed so we will attempt to set her up in the chair tomorrow for her to resume her outpatient dialysis Severe -C/w Sevelamer Carbonate 1600 mg po TID -Avoid Nephrotoxic Medications, Contrast Dyes, Hypotension and Dehydration to Ensure Adequate Renal Perfusion and will need to Renally Adjust Meds -Continue to Monitor and Trend Renal Function carefully and repeat CMP in the AM   Sinus Tachycardia, improving but persists -Likely Do to Pain and Agitation however if persist so we will do an infectious workup and obtain blood cultures and chest x-ray and urinalysis to further evaluate but she has no infectious symptoms noted -Will give a small 250 mL bolus  -C/w Supportive Measures -Give BB with Metoprolol Tartrate IV 5 mg x1 and then start po Metoprolol Tartrate 12.5 mg po BID -Continue to Monitor on Telemetry and repeat EKG in the AM  Leukocytosis, slightly elevated -Mild and likely Reactive and ? Due to Pain now since persisting will do an infectious workup and get blood cultures x 2, urinalysis and chest x-ray -WBC Trend: Recent Labs  Lab 03/03/23 0135 03/06/23 0630 03/07/23 0251 03/08/23 0523 03/09/23 0158 03/09/23 0836 03/10/23 0110  WBC 7.1 9.2 10.5 9.5 12.3* 11.8* 13.4*  -Continue to Monitor for S/Sx of Infection -Repeat CBC in the  AM  Hyponatremia -Na+ Trend: Recent Labs  Lab 03/01/23 0809 03/03/23 0135 03/06/23 0630 03/07/23 0251 03/08/23 0523 03/09/23 0158 03/10/23 0110  NA 134* 137 135 132* 129* 135 132*  -Continue to Monitor and Trend and Repeat CMP within 1 week  Anemia of Chronic Kidney Disease compounded by Postoperative Anemia Hemoglobin has stabilized at approximately 9 the last few days but dropped a little today -Receives Darbepoetin Alfa 60 mcg sq qMon -Hgb/Hct Trend: Recent Labs  Lab 03/03/23 0135 03/06/23 0630 03/07/23 0251 03/08/23 0523 03/09/23 0158 03/09/23 0836 03/10/23 0110  HGB 9.6* 7.9* 7.6* 9.4* 9.3* 9.6* 9.0*  HCT 29.3* 24.7* 23.7* 29.3* 28.3* 29.6* 28.2*  MCV 93.6 94.3 93.3 89.1 90.1 90.8 89.5  -Orthopedic surgery is typed and screened and transfused 1 unit of PRBCs given her drop in hemoglobin hematocrit and tachycardia on 03/07/23 -Continue to monitor for signs and symptoms of bleeding; no overt bleeding noted -Repeat CBC in the AM   Malnutrition of Moderate Degree  -Related to chronic illness (ESRD, schizoaffective disorder) as evidenced by moderate fat depletion, severe fat depletion, moderate muscle depletion -Nutrition Status: Nutrition Problem: Moderate Malnutrition Etiology: chronic illness (ESRD, schizoaffective disorder) Signs/Symptoms: moderate fat depletion, severe fat depletion, moderate muscle depletion Interventions: Ensure Enlive (each  supplement provides 350kcal and 20 grams of protein)  GERD/GI Prophylaxis -C/w Pantoprazole 40 mg po Daily  Hypoalbuminemia -Patient's Albumin Trend: Recent Labs  Lab 03/01/23 0809 03/03/23 0135 03/06/23 0630 03/07/23 0251 03/08/23 0523 03/09/23 0158 03/10/23 0110  ALBUMIN 2.7* 2.4* 2.4* 2.3* 2.5* 2.4* 2.3*  -Continue to Monitor and Trend and repeat CMP in the AM

## 2023-03-06 NOTE — Progress Notes (Signed)
Advised by CSW that pt may d/c to snf later today or tomorrow. Contacted [facility] and spoke to clinic Production designer, theatre/television/film. Clinic advised of pt's possible d/c and that pt should resume on Friday. Clinic advised pt will likely require a hoyer lift from w/c to HD chair. Clinic confirms hoyer lift is available. Contacted CSW to please remind snf to please send pt to HD with hoyer pad under pt so staff can lift pt. Clinic also asking if pt has been out of bed to chair much in order to make sure pt can sit for HD. Contacted CSW and pt's RN to ask that question. Will assist as needed.   Olivia Canter Renal Navigator [phone]

## 2023-03-06 NOTE — Progress Notes (Signed)
PROGRESS NOTE    Madison [name]  WUJ:[id number] DOB: July 17, 1951 DOA: 02/26/2023 PCP: Charlynne Pander, MD   Brief Narrative:  The patient is a 72 year old SNF resident with past medical history significant for but limited to ESRD on hemodialysis Monday Wednesday Friday, hypertension, history of schizophrenia versus bipolar disorder as well as other comorbidities who presented to the [facility] ER after a fall which occurred multiple days prior.  X-rays at [facility] ER revealed a left femoral fracture and she was transferred to [facility] given that she needed a combination of the orthopedist as well as inpatient dialysis.  She underwent unipolar Hemi arthroplasty of the left hip on 02/28/2023 and has not been deemed stable from a orthopedic standpoint.  She had issues with her dialysis catheter which has not been fixed and she is getting dialysis.  She was extremely agitated and hysterically crying today.  Anticipating discharging back to SNF in next 24 to 48 hours with resumption of her normal dialysis session on Friday, 03/08/2023.  Assessment and Plan:  Left Femoral Neck Fracture -Further Care per Orthopedic Surgery - s/p unipolar hemiarthroplasty of the left hip 02/28/2023  -Appears to be stable from an orthopedic standpoint they are recommending weightbearing as tolerated on the left lower extremity with assistance and posterior hip precautions for 12 weeks with daily wound care as needed and VTE prophylaxis with subcu heparin for 28 days -C with pain control and she is on oxycodone 5 to 10 mg every 4 as needed for moderate pain and acetaminophen 660 mg p.o. 3 times daily, and on fentanyl 12.5 mcg every 2 hours as needed for severe pain -Continue with bowel regimen with Senokot 8.6 mg p.o. twice daily, docusate 100 mL p.o. twice daily  -DG Knee done and showed "No evidence of acute osseous abnormality. Stable degenerative change."              Schizoaffective Disorder, Bipolar Type with uncontrollable  crying and agitation today -Continue usual home medical regimen and continue with aripiprazole 50 mg p.o. hemodialysis center history, benztropine 0.5 mg p.o. twice daily and continue Valproic Acid 250 mg po TID -Patient was hysterically crying and extremely agitated and anxious today -Continue Mirtazapine 15 mg p.o. nightly -Utilize as needed Haldol for agitation and resume today   ESRD on HD [facility] Hyperphosphatemia Elevated Anion Gap -Ongoing issues with dialysis cannulation now corrected status post PTA to the left brachial vein outflow stenosis to 14 mm by the interventional radiology team Dr. Loreta Ave on 03/04/2023 -BUN/Cr Trend: Recent Labs  Lab 02/26/23 2044 02/27/23 0650 02/28/23 0758 02/28/23 1639 03/01/23 0809 03/03/23 0135 03/06/23 0630  BUN 83* 83* 43* 46* 61* 47* 53*  CREATININE 12.35* 11.90* 7.40* 7.79* 9.11* 7.36* 7.31*  -Phos Level Trend: Recent Labs  Lab 02/27/23 0650 02/28/23 1639 03/01/23 0809 03/03/23 0135 03/06/23 0630  PHOS 6.6* 7.5* 7.8* 6.1* 7.0*  -Patient has an elevated anion gap of 19, CO2 24, chloride level 92 -Further Care per Nephrology -C/w Sevelamer Carbonate 1600 mg po TID -Avoid Nephrotoxic Medications, Contrast Dyes, Hypotension and Dehydration to Ensure Adequate Renal Perfusion and will need to Renally Adjust Meds -Continue to Monitor and Trend Renal Function carefully and repeat CMP in the AM   Anemia of Chronic Kidney Disease compounded by Postoperative Anemia Hemoglobin has stabilized at approximately 9 the last few days but dropped a little today -Receives Darbepoetin Alfa 60 mcg sq qMon -Hgb/Hct Trend: Recent Labs  Lab 02/26/23 2044 02/27/23 9562 02/28/23 0758 03/01/23 0809  03/02/23 0109 03/03/23 0135 03/06/23 0630  HGB 10.8* 9.7* 11.6* 9.0* 9.1* 9.6* 7.9*  HCT 33.9* 30.9* 34.0* 28.0* 28.1* 29.3* 24.7*  MCV 95.8 94.8  --  92.7 95.6 93.6 94.3  -Continue to monitor for signs and symptoms of bleeding; no overt bleeding  noted -Repeat CBC in the AM   Malnutrition of Moderate Degree  -Related to chronic illness (ESRD, schizoaffective disorder) as evidenced by moderate fat depletion, severe fat depletion, moderate muscle depletion -Nutrition Status: Nutrition Problem: Moderate Malnutrition Etiology: chronic illness (ESRD, schizoaffective disorder) Signs/Symptoms: moderate fat depletion, severe fat depletion, moderate muscle depletion Interventions: Nepro shake, MVI, Liberalize Diet  GERD/GI Prophylaxis -C/w Pantoprazole 40 mg po Daily  Hypoalbuminemia -Patient's Albumin Trend: Recent Labs  Lab 02/27/23 0650 02/28/23 1639 03/01/23 0809 03/03/23 0135 03/06/23 0630  ALBUMIN 2.5* 3.0* 2.7* 2.4* 2.4*  -Continue to Monitor and Trend and repeat CMP in the AM   DVT prophylaxis: heparin injection 5,000 Units Start: 02/28/23 2200 SCDs Start: 02/28/23 1334    Code Status: Full Code Family Communication: No family currently at bedside  Disposition Plan:  Level of care: Med-Surg Status is: Inpatient Remains inpatient appropriate because: Dissipating discharging back to SNF in next 24 to 48 hours   Consultants:  Orthopedic surgery Nephrology Interventional Radiology  Procedures:  As delineated as above  Antimicrobials:  Anti-infectives (From admission, onward)    Start     Dose/Rate Route Frequency Ordered Stop   02/28/23 1430  ceFAZolin (ANCEF) IVPB 2g/100 mL premix  Status:  Discontinued        2 g 200 mL/hr over 30 Minutes Intravenous Every 6 hours 02/28/23 1334 02/28/23 1344   02/28/23 1147  vancomycin (VANCOCIN) powder  Status:  Discontinued          As needed 02/28/23 1147 02/28/23 1216   02/28/23 0830  ceFAZolin (ANCEF) IVPB 2g/100 mL premix        2 g 200 mL/hr over 30 Minutes Intravenous On call to O.R. 02/28/23 0731 02/28/23 1033   02/28/23 0830  vancomycin (VANCOCIN) IVPB 1000 mg/200 mL premix        1,000 mg 200 mL/hr over 60 Minutes Intravenous On call to O.R. 02/28/23 1610  02/28/23 0858       Subjective: Seen and examined at bedside after she just come back from dialysis that she is hysterically crying and subjective history cannot be obtained due to her current condition.  She appears very agitated and not able to be oriented given her history.  No family currently at bedside.  Anticipating discharging to SNF in next 24 to 48 hours.  Objective: Vitals:   03/06/23 1322 03/06/23 1505 03/06/23 1548 03/06/23 1805  BP:  (!) 145/85 118/69 130/62  Pulse: (!) 146 (!) 141 (!) 138 (!) 132  Resp:  (!) 26  20  Temp:  98.9 F (37.2 C)  98.9 F (37.2 C)  TempSrc:  Oral  Oral  SpO2:  96%  98%  Weight:      Height:        Intake/Output Summary (Last 24 hours) at 03/06/2023 1856 Last data filed at 03/06/2023 1721 Gross per 24 hour  Intake 840 ml  Output 700 ml  Net 140 ml   Filed Weights   03/04/23 1213 03/04/23 1726 03/05/23 0426  Weight: 49.4 kg 48.3 kg 48.3 kg   Examination: Physical Exam:  Constitutional: Thin cachectic chronically ill-appearing African-American female who is significantly agitated and hysterically crying Respiratory: Diminished to auscultation bilaterally, no wheezing,  rales, rhonchi or crackles. Normal respiratory effort and patient is not tachypenic. No accessory muscle use.  Unlabored breathing Cardiovascular: RRR, no murmurs / rubs / gallops. S1 and S2 auscultated. No extremity edema.  Abdomen: Soft, non-tender, non-distended. No masses palpated. No appreciable hepatosplenomegaly. Bowel sounds positive.  GU: Deferred. Musculoskeletal: No clubbing / cyanosis of digits/nails. No joint deformity upper and lower extremities.  Skin: No rashes, lesions, ulcers on a limited skin evaluation. No induration; Warm and dry.  Neurologic: CN 2-12 grossly intact with no focal deficits Psychiatric: She is awake but not oriented and extremely tearful and anxious  Data Reviewed: I have personally reviewed following labs and imaging  studies  CBC: Recent Labs  Lab 02/28/23 0758 03/01/23 0809 03/02/23 0109 03/03/23 0135 03/06/23 0630  WBC  --  8.1 8.9 7.1 9.2  HGB 11.6* 9.0* 9.1* 9.6* 7.9*  HCT 34.0* 28.0* 28.1* 29.3* 24.7*  MCV  --  92.7 95.6 93.6 94.3  PLT  --  281 247 242 355   Basic Metabolic Panel: Recent Labs  Lab 02/28/23 0758 02/28/23 1639 03/01/23 0809 03/03/23 0135 03/06/23 0630  NA 133* 133* 134* 137 135  K 5.0 5.6* 4.9 5.0 4.7  CL 96* 93* 93* 93* 92*  CO2  --  23 22 25 24   GLUCOSE 96 150* 107* 85 83  BUN 43* 46* 61* 47* 53*  CREATININE 7.40* 7.79* 9.11* 7.36* 7.31*  CALCIUM  --  9.3 9.5 10.0 9.9  PHOS  --  7.5* 7.8* 6.1* 7.0*   GFR: Estimated Creatinine Clearance: 5.2 mL/min (A) (by C-G formula based on SCr of 7.31 mg/dL (H)). Liver Function Tests: Recent Labs  Lab 02/28/23 1639 03/01/23 0809 03/03/23 0135 03/06/23 0630  ALBUMIN 3.0* 2.7* 2.4* 2.4*   No results for input(s): "LIPASE", "AMYLASE" in the last 168 hours. No results for input(s): "AMMONIA" in the last 168 hours. Coagulation Profile: No results for input(s): "INR", "PROTIME" in the last 168 hours. Cardiac Enzymes: No results for input(s): "CKTOTAL", "CKMB", "CKMBINDEX", "TROPONINI" in the last 168 hours. BNP (last 3 results) No results for input(s): "PROBNP" in the last 8760 hours. HbA1C: No results for input(s): "HGBA1C" in the last 72 hours. CBG: No results for input(s): "GLUCAP" in the last 168 hours. Lipid Profile: No results for input(s): "CHOL", "HDL", "LDLCALC", "TRIG", "CHOLHDL", "LDLDIRECT" in the last 72 hours. Thyroid Function Tests: No results for input(s): "TSH", "T4TOTAL", "FREET4", "T3FREE", "THYROIDAB" in the last 72 hours. Anemia Panel: No results for input(s): "VITAMINB12", "FOLATE", "FERRITIN", "TIBC", "IRON", "RETICCTPCT" in the last 72 hours. Sepsis Labs: No results for input(s): "PROCALCITON", "LATICACIDVEN" in the last 168 hours.  Recent Results (from the past 240 hour(s))  Surgical  pcr screen     Status: None   Collection Time: 02/28/23  4:51 AM   Specimen: Nasal Mucosa; Nasal Swab  Result Value Ref Range Status   MRSA, PCR NEGATIVE NEGATIVE Final   Staphylococcus aureus NEGATIVE NEGATIVE Final    Comment: (NOTE) The Xpert SA Assay (FDA approved for NASAL specimens in patients 57 years of age and older), is one component of a comprehensive surveillance program. It is not intended to diagnose infection nor to guide or monitor treatment. Performed at [facility], [address], [place], [place] [address]     Radiology Studies: No results found.  Scheduled Meds:  acetaminophen  650 mg Oral TID   benztropine  0.5 mg Oral BID   Chlorhexidine Gluconate Cloth  6 each Topical Q0600  darbepoetin (ARANESP) injection - NON-DIALYSIS  60 mcg Subcutaneous Q Mon-1800   docusate sodium  100 mg Oral BID   feeding supplement (NEPRO CARB STEADY)  237 mL Oral BID BM   heparin  5,000 Units Subcutaneous Q8H   mirtazapine  15 mg Oral QHS   multivitamin  1 tablet Oral QHS   pantoprazole  40 mg Oral Daily   senna  1 tablet Oral BID   sevelamer carbonate  1,600 mg Oral TID WC   valproic acid  250 mg Oral TID WC   Continuous Infusions:   LOS: 8 days   Marguerita Merles, DO [facility] Available via Epic secure chat 7am-7pm After these hours, please refer to coverage provider listed on [email or web address] 03/06/2023, 6:56 PM

## 2023-03-06 NOTE — TOC Progression Note (Signed)
Transition of Care [facility]) - Progression Note    Patient Details  Name: ELYSE PREVO MRN: [id number] Date of Birth: 26-Jul-1951  [facility] of [facility]) CM/SW Contact  Lorri Frederick, LCSW Phone Number: 03/06/2023, 11:31 AM  Clinical Narrative:   TC sister/guardian Meriam Sprague, asking for update.  Meriam Sprague reports she has not heard from administration at [facility] yet, did not receive calls from them.  She has thought about other SNF options and does think she is going to have pt return to [facility] rather than change to new SNF.  CSW will reach out to [employer] to request call to [facility].  CSW sent message to [facility], she will ask admin to call Meriam Sprague, confirmed phone number.  [phone]: TC [place].  She has not heard from anyone at [facility].  CSW confirmed that she does want to move forward with return to [facility].  CSW confirmed with [facility].  She will follow up so that admin there speaks with [facility].  Revonda Standard confirmed they can receive pt today.  No auth.  CSW reached out to Traci/Nephro regarding outpt HD resumption.    Expected Discharge Plan: Skilled Nursing Facility Barriers to Discharge: Continued Medical Work up, SNF Pending bed offer  Expected Discharge Plan and Services In-house Referral: Clinical Social Work   Post Acute Care Choice: Skilled Nursing Facility Living arrangements for the past 2 months: Skilled Nursing Facility ([facility])                                       Social Determinants of Health (SDOH) Interventions SDOH Screenings   Alcohol Screen: Low Risk  (12/25/2018)  Tobacco Use: Low Risk  (03/04/2023)    Readmission Risk Interventions    12/27/2022    8:27 AM  Readmission Risk Prevention Plan  Transportation Screening Complete  HRI or Home Care Consult Complete  Social Work Consult for Recovery Care Planning/Counseling Complete  Palliative Care Screening Not Applicable   Medication Review [employer]) Complete

## 2023-03-07 ENCOUNTER — Inpatient Hospital Stay (HOSPITAL_COMMUNITY): Payer: Medicare Other

## 2023-03-07 DIAGNOSIS — E440 Moderate protein-calorie malnutrition: Secondary | ICD-10-CM | POA: Diagnosis not present

## 2023-03-07 DIAGNOSIS — N186 End stage renal disease: Secondary | ICD-10-CM | POA: Diagnosis not present

## 2023-03-07 DIAGNOSIS — F250 Schizoaffective disorder, bipolar type: Secondary | ICD-10-CM | POA: Diagnosis not present

## 2023-03-07 DIAGNOSIS — S72002D Fracture of unspecified part of neck of left femur, subsequent encounter for closed fracture with routine healing: Secondary | ICD-10-CM | POA: Diagnosis not present

## 2023-03-07 LAB — CBC WITH DIFFERENTIAL/PLATELET
Abs Immature Granulocytes: 0.07 10*3/uL (ref 0.00–0.07)
Basophils Absolute: 0.1 10*3/uL (ref 0.0–0.1)
Basophils Relative: 1 %
Eosinophils Absolute: 0.3 10*3/uL (ref 0.0–0.5)
Eosinophils Relative: 3 %
HCT: 23.7 % — ABNORMAL LOW (ref 36.0–46.0)
Hemoglobin: 7.6 g/dL — ABNORMAL LOW (ref 12.0–15.0)
Immature Granulocytes: 1 %
Lymphocytes Relative: 16 %
Lymphs Abs: 1.7 10*3/uL (ref 0.7–4.0)
MCH: 29.9 pg (ref 26.0–34.0)
MCHC: 32.1 g/dL (ref 30.0–36.0)
MCV: 93.3 fL (ref 80.0–100.0)
Monocytes Absolute: 1.5 10*3/uL — ABNORMAL HIGH (ref 0.1–1.0)
Monocytes Relative: 15 %
Neutro Abs: 6.8 10*3/uL (ref 1.7–7.7)
Neutrophils Relative %: 64 %
Platelets: 305 10*3/uL (ref 150–400)
RBC: 2.54 MIL/uL — ABNORMAL LOW (ref 3.87–5.11)
RDW: 19.6 % — ABNORMAL HIGH (ref 11.5–15.5)
WBC: 10.5 10*3/uL (ref 4.0–10.5)
nRBC: 0 % (ref 0.0–0.2)

## 2023-03-07 LAB — TYPE AND SCREEN: Antibody Screen: NEGATIVE

## 2023-03-07 LAB — COMPREHENSIVE METABOLIC PANEL
ALT: 6 U/L (ref 0–44)
AST: 21 U/L (ref 15–41)
Albumin: 2.3 g/dL — ABNORMAL LOW (ref 3.5–5.0)
Alkaline Phosphatase: 50 U/L (ref 38–126)
Anion gap: 16 — ABNORMAL HIGH (ref 5–15)
BUN: 36 mg/dL — ABNORMAL HIGH (ref 8–23)
CO2: 26 mmol/L (ref 22–32)
Calcium: 9.8 mg/dL (ref 8.9–10.3)
Chloride: 90 mmol/L — ABNORMAL LOW (ref 98–111)
Creatinine, Ser: 4.73 mg/dL — ABNORMAL HIGH (ref 0.44–1.00)
GFR, Estimated: 9 mL/min — ABNORMAL LOW (ref >60)
Glucose, Bld: 103 mg/dL — ABNORMAL HIGH (ref 70–99)
Potassium: 4.5 mmol/L (ref 3.5–5.1)
Sodium: 132 mmol/L — ABNORMAL LOW (ref 135–145)
Total Bilirubin: 0.7 mg/dL (ref 0.3–1.2)
Total Protein: 6.4 g/dL — ABNORMAL LOW (ref 6.5–8.1)

## 2023-03-07 LAB — BPAM RBC

## 2023-03-07 LAB — PHOSPHORUS: Phosphorus: 4.3 mg/dL (ref 2.5–4.6)

## 2023-03-07 LAB — MAGNESIUM: Magnesium: 1.7 mg/dL (ref 1.7–2.4)

## 2023-03-07 LAB — PREPARE RBC (CROSSMATCH)

## 2023-03-07 MED ORDER — ENSURE ENLIVE PO LIQD
237.0000 mL | Freq: Two times a day (BID) | ORAL | Status: DC
  Administered 2023-03-08 – 2023-03-10 (×5): 237 mL via ORAL

## 2023-03-07 MED ORDER — ACETAMINOPHEN 325 MG PO TABS: 650.0000 mg | ORAL_TABLET | Freq: Three times a day (TID) | ORAL | 0 refills | Status: DC

## 2023-03-07 MED ORDER — DOCUSATE SODIUM 100 MG PO CAPS: 100.0000 mg | ORAL_CAPSULE | Freq: Two times a day (BID) | ORAL | 0 refills | Status: DC

## 2023-03-07 MED ORDER — HEPARIN SODIUM (PORCINE) 5000 UNIT/ML IJ SOLN: 5000.0000 [IU] | Freq: Three times a day (TID) | INTRAMUSCULAR | 0 refills | Status: DC

## 2023-03-07 MED ORDER — OXYCODONE HCL 5 MG PO TABS: 5.0000 mg | ORAL_TABLET | Freq: Three times a day (TID) | ORAL | 0 refills | Status: DC | PRN

## 2023-03-07 MED ORDER — SODIUM CHLORIDE 0.9% IV SOLUTION: Freq: Once | INTRAVENOUS | Status: DC

## 2023-03-07 NOTE — Progress Notes (Signed)
Occupational Therapy Treatment Patient Details Name: Madison [name] MRN: [id number] DOB: 1950/12/09 Today's Date: 03/07/2023   History of present illness 72 y.o. female admitted 6/4 for fall and hip pain, s/p UNIPOLAR HEMIARTHROPLASTY OF THE LEFT HIP on 6/6. PMHx: history of ESRD, HTN, and schizophrenia versus bipolar disorder.   OT comments  OT attempted to get pt to EOB today but pt far too limited by pain and screaming with LLE movement, upon room entry pt noted to have L hip internal rotated with ankles inverted. Discussed with MD and PA via epic chat the potential need for reassessing hip after pt seen in position that compromises precautions, PA schedule an x ray. Reinforced hip precautions to patient but they continue to not understand them likely due to level of cognition and anxiety. Pt repositioned with pillows to promote L hip abduction and slight external rotation of ankle. OT to continue to progress pt as able, to follow-up after x rays show that L hip is not compromised. DC plans remain appropriate for SNF at this time.    Recommendations for follow up therapy are one component of a multi-disciplinary discharge planning process, led by the attending physician.  Recommendations may be updated based on patient status, additional functional criteria and insurance authorization.    Assistance Recommended at Discharge Frequent or constant Supervision/Assistance  Patient can return home with the following  Two people to help with walking and/or transfers;Direct supervision/assist for financial management;Two people to help with bathing/dressing/bathroom;Direct supervision/assist for medications management;Assistance with cooking/housework;Assist for transportation;Help with stairs or ramp for entrance;Assistance with feeding   Equipment Recommendations  Other (comment) (defer to next level of care)    Recommendations for Other Services      Precautions / Restrictions  Precautions Precautions: Posterior Hip (Left) Precaution Booklet Issued: Yes (comment) Precaution Comments: Reviewed with pt Required Braces or Orthoses: Knee Immobilizer - Left Knee Immobilizer - Left: Other (comment) Restrictions Weight Bearing Restrictions: Yes LLE Weight Bearing: Weight bearing as tolerated       Mobility Bed Mobility Overal bed mobility: Needs Assistance Bed Mobility: Rolling Rolling: Max assist         General bed mobility comments: Pt repositioned in bed to provide better compliance with posterior hip precautions. L hip abducted and externally rotated with pillows    Transfers                   General transfer comment: Deferred due to pain with any LLE movement. MD and PA contacted to order xray on hip     Balance       Sitting balance - Comments: defer       Standing balance comment: NT                           ADL either performed or assessed with clinical judgement   ADL                                         General ADL Comments: Limited to bed level today, Pt noted to have L hip internally rotated upon OT entry.    Extremity/Trunk Assessment              Vision       Perception     Praxis      Cognition Arousal/Alertness: Awake/alert Behavior During Therapy: Restless,  Anxious Overall Cognitive Status: History of cognitive impairments - at baseline                                 General Comments: Pt repeatedly saying "I don't know no more." Pt answering more questions regarding herself and pain appropriately when prompted        Exercises      Shoulder Instructions       General Comments Discussed with MD and PA via epic chat that pt's natural resting position contraindicated her hip precautions, pt having screaming pain with any light LLE movement. PA reports he will order x ray.    Pertinent Vitals/ Pain       Pain Assessment Pain Assessment:  PAINAD Breathing: occasional labored breathing, short period of hyperventilation Negative Vocalization: repeated troubled calling out, loud moaning/groaning, crying Facial Expression: facial grimacing Body Language: tense, distressed pacing, fidgeting Consolability: unable to console, distract or reassure PAINAD Score: 8 Pain Location: L hip Pain Descriptors / Indicators: Grimacing, Guarding, Moaning Pain Intervention(s): Monitored during session, Repositioned, Limited activity within patient's tolerance  Home Living                                          Prior Functioning/Environment              Frequency  Min 1X/week        Progress Toward Goals  OT Goals(current goals can now be found in the care plan section)  Progress towards OT goals: Not progressing toward goals - comment (Limited by pain)  Acute Rehab OT Goals OT Goal Formulation: With patient Time For Goal Achievement: 03/16/23 Potential to Achieve Goals: Good  Plan Discharge plan remains appropriate;Frequency remains appropriate    Co-evaluation                 AM-PAC OT "6 Clicks" Daily Activity     Outcome Measure   Help from another person eating meals?: A Lot Help from another person taking care of personal grooming?: A Little Help from another person toileting, which includes using toliet, bedpan, or urinal?: Total Help from another person bathing (including washing, rinsing, drying)?: Total Help from another person to put on and taking off regular upper body clothing?: A Lot Help from another person to put on and taking off regular lower body clothing?: Total 6 Click Score: 10    End of Session    OT Visit Diagnosis: Muscle weakness (generalized) (M62.81);Other symptoms and signs involving cognitive function;Pain Pain - Right/Left: Left Pain - part of body: Hip   Activity Tolerance Patient limited by pain   Patient Left in bed;with call bell/phone within  reach;with bed alarm set   Nurse Communication Need for lift equipment        Time: 1500-1519 OT Time Calculation (min): 19 min  Charges: OT General Charges $OT Visit: 1 Visit OT Treatments $Therapeutic Activity: 8-22 mins  03/07/2023  AB, OTR/L  Acute Rehabilitation Services  Office: [phone]   Tristan Schroeder 03/07/2023, 4:27 PM

## 2023-03-07 NOTE — Progress Notes (Signed)
PROGRESS NOTE    Madison [name]  ZOX:[id number] DOB: 05/25/1951 DOA: 02/26/2023 PCP: Charlynne Pander, MD   Brief Narrative:  The patient is a 72 year old SNF resident with past medical history significant for but limited to ESRD on hemodialysis Monday Wednesday Friday, hypertension, history of schizophrenia versus bipolar disorder as well as other comorbidities who presented to the [facility] ER after a fall which occurred multiple days prior.  X-rays at [facility] ER revealed a left femoral fracture and she was transferred to [facility] given that she needed a combination of the orthopedist as well as inpatient dialysis.  She underwent unipolar Hemi arthroplasty of the left hip on 02/28/2023 and has not been deemed stable from a orthopedic standpoint.  She had issues with her dialysis catheter which has not been fixed and she is getting dialysis.  She was extremely agitated and hysterically crying yesterday and remains emotionally labile and tearful however was calmer.  We were anticipating discharging back to SNF in next 24 to 48 hours with resumption of her normal dialysis session on Friday, 03/08/2023 however a barrier to discharge is that the patient will need to be able to tolerate sitting in a chair for 4 to 5 hours to be appropriate for outpatient HD for discharge and the plan is for her to receive hemodialysis in the chair tomorrow to ensure that patient can tolerate  Assessment and Plan:  Left Femoral Neck Fracture -Further Care per Orthopedic Surgery - s/p unipolar hemiarthroplasty of the left hip 02/28/2023  -Appears to be stable from an orthopedic standpoint they are recommending weightbearing as tolerated on the left lower extremity with assistance and posterior hip precautions for 12 weeks with daily wound care as needed and VTE prophylaxis with subcu heparin for 28 days -C/w with pain control and she is on oxycodone 5 to 10 mg every 4 as needed for moderate pain and acetaminophen 660 mg p.o. 3  times daily, and on Fentanyl 12.5 mcg every 2 hours as needed for severe pain -Continue with bowel regimen with Senokot 8.6 mg p.o. twice daily, docusate 100 mL p.o. twice daily  -DG Knee done and showed "No evidence of acute osseous abnormality. Stable degenerative change." -Given hip rotation orthopedic surgery is obtaining an x-ray -Getting surgery recommending following up in 10 days after discharge              Schizoaffective Disorder, Bipolar Type with uncontrollable crying and agitation today -Continue usual home medical regimen and continue with aripiprazole 50 mg p.o. hemodialysis center history, benztropine 0.5 mg p.o. twice daily and continue Valproic Acid 250 mg po TID -Patient was hysterically crying and extremely agitated and anxious today -Continue Mirtazapine 15 mg p.o. nightly -Utilize as needed Haldol for agitation and resumed yesterday   ESRD on HD MWF Hyperphosphatemia, improved with Dialysis  Elevated Anion Gap -Ongoing issues with dialysis cannulation now corrected status post PTA to the left brachial vein outflow stenosis to 14 mm by the interventional radiology team Dr. Loreta Ave on 03/04/2023 -BUN/Cr Trend: Recent Labs  Lab 02/27/23 0650 02/28/23 0758 02/28/23 1639 03/01/23 0809 03/03/23 0135 03/06/23 0630 03/07/23 0251  BUN 83* 43* 46* 61* 47* 53* 36*  CREATININE 11.90* 7.40* 7.79* 9.11* 7.36* 7.31* 4.73*  -Phos Level Trend: Recent Labs  Lab 02/27/23 0650 02/28/23 1639 03/01/23 0809 03/03/23 0135 03/06/23 0630 03/07/23 0251  PHOS 6.6* 7.5* 7.8* 6.1* 7.0* 4.3  -Patient has an elevated anion gap of 16, CO2 26, chloride level 90 -Further Care per  Nephrology and she needs to dialyze in the chair prior to being discharged and was dialyzed in the bed so we will attempt to set her up in the chair tomorrow for her to resume her outpatient dialysis Severe -C/w Sevelamer Carbonate 1600 mg po TID -Avoid Nephrotoxic Medications, Contrast Dyes, Hypotension and  Dehydration to Ensure Adequate Renal Perfusion and will need to Renally Adjust Meds -Continue to Monitor and Trend Renal Function carefully and repeat CMP in the AM   Hyponatremia -Na+ Trend: Recent Labs  Lab 02/27/23 0650 02/28/23 0758 02/28/23 1639 03/01/23 0809 03/03/23 0135 03/06/23 0630 03/07/23 0251  NA 136 133* 133* 134* 137 135 132*  -Continue to Monitor and Trend and Repeat CMP within 1 week  Anemia of Chronic Kidney Disease compounded by Postoperative Anemia Hemoglobin has stabilized at approximately 9 the last few days but dropped a little today -Receives Darbepoetin Alfa 60 mcg sq qMon -Hgb/Hct Trend: Recent Labs  Lab 02/27/23 0650 02/28/23 0758 03/01/23 0809 03/02/23 0109 03/03/23 0135 03/06/23 0630 03/07/23 0251  HGB 9.7* 11.6* 9.0* 9.1* 9.6* 7.9* 7.6*  HCT 30.9* 34.0* 28.0* 28.1* 29.3* 24.7* 23.7*  MCV 94.8  --  92.7 95.6 93.6 94.3 93.3  -Orthopedic surgery is typed and screened and transfusing 1 unit of PRBCs given her drop in hemoglobin hematocrit and tachycardia -Continue to monitor for signs and symptoms of bleeding; no overt bleeding noted -Repeat CBC in the AM   Malnutrition of Moderate Degree  -Related to chronic illness (ESRD, schizoaffective disorder) as evidenced by moderate fat depletion, severe fat depletion, moderate muscle depletion -Nutrition Status: Nutrition Problem: Moderate Malnutrition Etiology: chronic illness (ESRD, schizoaffective disorder) Signs/Symptoms: moderate fat depletion, severe fat depletion, moderate muscle depletion Interventions: Nepro shake, MVI, Liberalize Diet  GERD/GI Prophylaxis -C/w Pantoprazole 40 mg po Daily  Hypoalbuminemia -Patient's Albumin Trend: Recent Labs  Lab 02/27/23 0650 02/28/23 1639 03/01/23 0809 03/03/23 0135 03/06/23 0630 03/07/23 0251  ALBUMIN 2.5* 3.0* 2.7* 2.4* 2.4* 2.3*  -Continue to Monitor and Trend and repeat CMP in the AM   DVT prophylaxis: heparin injection 5,000 Units Start:  02/28/23 2200 SCDs Start: 02/28/23 1334    Code Status: Full Code Family Communication: No family present at bedside  Disposition Plan:  Level of care: Med-Surg Status is: Inpatient Remains inpatient appropriate because: The patient surgery is transfusing 1 unit of PRBCs and she needs to be able to be sitting in dialysis chair for 4 to 5 hours prior to be discharging to resume outpatient dialysis   Consultants:  Orthopedic Surgery Nephrology Interventional Radiology  Procedures:  As delineated as above  Antimicrobials:  Anti-infectives (From admission, onward)    Start     Dose/Rate Route Frequency Ordered Stop   02/28/23 1430  ceFAZolin (ANCEF) IVPB 2g/100 mL premix  Status:  Discontinued        2 g 200 mL/hr over 30 Minutes Intravenous Every 6 hours 02/28/23 1334 02/28/23 1344   02/28/23 1147  vancomycin (VANCOCIN) powder  Status:  Discontinued          As needed 02/28/23 1147 02/28/23 1216   02/28/23 0830  ceFAZolin (ANCEF) IVPB 2g/100 mL premix        2 g 200 mL/hr over 30 Minutes Intravenous On call to O.R. 02/28/23 0731 02/28/23 1033   02/28/23 0830  vancomycin (VANCOCIN) IVPB 1000 mg/200 mL premix        1,000 mg 200 mL/hr over 60 Minutes Intravenous On call to O.R. 02/28/23 1610 02/28/23 9604  Subjective: Seen and examined at bedside and appeared a little bit calmer but was still very emotionally labile and started crying because she is not able to feed herself and wanted help eating.  She is not as agitated heart rate is not is elevated.  Was anticipating discharging her to SNF however she was dialyzed in the bed and needs to be dialyzed sitting up in a chair for 4 to 5 hours told to try to dialyze her chair tomorrow.  No other concerns or complaints at this time.  Objective: Vitals:   03/06/23 2202 03/07/23 0512 03/07/23 0901 03/07/23 1245  BP: 120/70 138/71 118/67 108/64  Pulse: (!) 117 (!) 116 99   Resp: 16 15 18 18   Temp: 98.9 F (37.2 C) 98.5 F (36.9  C) 98 F (36.7 C) 98 F (36.7 C)  TempSrc: Oral   Oral  SpO2: 97% 98% 100% 100%  Weight:      Height:        Intake/Output Summary (Last 24 hours) at 03/07/2023 1600 Last data filed at 03/07/2023 1400 Gross per 24 hour  Intake 690 ml  Output --  Net 690 ml   Filed Weights   03/04/23 1213 03/04/23 1726 03/05/23 0426  Weight: 49.4 kg 48.3 kg 48.3 kg   Examination: Physical Exam:  Constitutional: Thin cachectic chronically ill-appearing AAF who is not as agitated but still very tearful and emotionally labile Respiratory: Diminished to auscultation bilaterally, no wheezing, rales, rhonchi or crackles. Normal respiratory effort and patient is not tachypenic. No accessory muscle use.  Cardiovascular: Slightly tachycardic but regular rhythm, no murmurs / rubs / gallops. S1 and S2 auscultated. No extremity edema.  Abdomen: Soft, non-tender, non-distended. Bowel sounds positive.  GU: Deferred. Musculoskeletal: No clubbing / cyanosis of digits/nails. No joint deformity upper and lower extremities. Skin: No rashes, lesions, ulcers on a limited skin evaluation. No induration; Warm and dry.  Neurologic: CN 2-12 grossly intact with no focal deficits. Romberg sign and cerebellar reflexes not assessed.  Psychiatric: Impaired judgement and insight and she is extremely emotionally labile.   Data Reviewed: I have personally reviewed following labs and imaging studies  CBC: Recent Labs  Lab 03/01/23 0809 03/02/23 0109 03/03/23 0135 03/06/23 0630 03/07/23 0251  WBC 8.1 8.9 7.1 9.2 10.5  NEUTROABS  --   --   --   --  6.8  HGB 9.0* 9.1* 9.6* 7.9* 7.6*  HCT 28.0* 28.1* 29.3* 24.7* 23.7*  MCV 92.7 95.6 93.6 94.3 93.3  PLT 281 247 242 355 305   Basic Metabolic Panel: Recent Labs  Lab 02/28/23 1639 03/01/23 0809 03/03/23 0135 03/06/23 0630 03/07/23 0251  NA 133* 134* 137 135 132*  K 5.6* 4.9 5.0 4.7 4.5  CL 93* 93* 93* 92* 90*  CO2 23 22 25 24 26   GLUCOSE 150* 107* 85 83 103*  BUN  46* 61* 47* 53* 36*  CREATININE 7.79* 9.11* 7.36* 7.31* 4.73*  CALCIUM 9.3 9.5 10.0 9.9 9.8  MG  --   --   --   --  1.7  PHOS 7.5* 7.8* 6.1* 7.0* 4.3   GFR: Estimated Creatinine Clearance: 8.1 mL/min (A) (by C-G formula based on SCr of 4.73 mg/dL (H)). Liver Function Tests: Recent Labs  Lab 02/28/23 1639 03/01/23 0809 03/03/23 0135 03/06/23 0630 03/07/23 0251  AST  --   --   --   --  21  ALT  --   --   --   --  6  ALKPHOS  --   --   --   --  50  BILITOT  --   --   --   --  0.7  PROT  --   --   --   --  6.4*  ALBUMIN 3.0* 2.7* 2.4* 2.4* 2.3*   No results for input(s): "LIPASE", "AMYLASE" in the last 168 hours. No results for input(s): "AMMONIA" in the last 168 hours. Coagulation Profile: No results for input(s): "INR", "PROTIME" in the last 168 hours. Cardiac Enzymes: No results for input(s): "CKTOTAL", "CKMB", "CKMBINDEX", "TROPONINI" in the last 168 hours. BNP (last 3 results) No results for input(s): "PROBNP" in the last 8760 hours. HbA1C: No results for input(s): "HGBA1C" in the last 72 hours. CBG: No results for input(s): "GLUCAP" in the last 168 hours. Lipid Profile: No results for input(s): "CHOL", "HDL", "LDLCALC", "TRIG", "CHOLHDL", "LDLDIRECT" in the last 72 hours. Thyroid Function Tests: No results for input(s): "TSH", "T4TOTAL", "FREET4", "T3FREE", "THYROIDAB" in the last 72 hours. Anemia Panel: No results for input(s): "VITAMINB12", "FOLATE", "FERRITIN", "TIBC", "IRON", "RETICCTPCT" in the last 72 hours. Sepsis Labs: No results for input(s): "PROCALCITON", "LATICACIDVEN" in the last 168 hours.  Recent Results (from the past 240 hour(s))  Surgical pcr screen     Status: None   Collection Time: 02/28/23  4:51 AM   Specimen: Nasal Mucosa; Nasal Swab  Result Value Ref Range Status   MRSA, PCR NEGATIVE NEGATIVE Final   Staphylococcus aureus NEGATIVE NEGATIVE Final    Comment: (NOTE) The Xpert SA Assay (FDA approved for NASAL specimens in patients 22 years  of age and older), is one component of a comprehensive surveillance program. It is not intended to diagnose infection nor to guide or monitor treatment. Performed at [facility], [address], [place], [place] [address]     Radiology Studies: No results found.  Scheduled Meds:  sodium chloride   Intravenous Once   acetaminophen  650 mg Oral TID   benztropine  0.5 mg Oral BID   Chlorhexidine Gluconate Cloth  6 each Topical Q0600   darbepoetin (ARANESP) injection - NON-DIALYSIS  60 mcg Subcutaneous Q Mon-1800   docusate sodium  100 mg Oral BID   feeding supplement (NEPRO CARB STEADY)  237 mL Oral BID BM   heparin  5,000 Units Subcutaneous Q8H   mirtazapine  15 mg Oral QHS   multivitamin  1 tablet Oral QHS   pantoprazole  40 mg Oral Daily   senna  1 tablet Oral BID   sevelamer carbonate  1,600 mg Oral TID WC   valproic acid  250 mg Oral TID WC   Continuous Infusions:   LOS: 9 days   Marguerita Merles, DO [facility] Available via Epic secure chat 7am-7pm After these hours, please refer to coverage provider listed on [email or web address] 03/07/2023, 4:00 PM

## 2023-03-07 NOTE — Plan of Care (Signed)
  Problem: Activity: Goal: Risk for activity intolerance will decrease Outcome: Not Progressing   Problem: Nutrition: Goal: Adequate nutrition will be maintained Outcome: Not Progressing   

## 2023-03-07 NOTE — TOC Progression Note (Signed)
Transition of Care [facility]) - Initial/Assessment Note    Patient Details  Name: Madison [name] MRN: [id number] Date of Birth: March 02, 1951  [facility] CM/SW Contact:    Ralene Bathe, LCSW Phone Number: 03/07/2023, 11:00 AM  Clinical Narrative:                 LCSW informed that patient will need to dialyze in a chair prior to discharge.  Plan is for patient to dialyze in recliner tomorrow.   TOC following.    Expected Discharge Plan: Skilled Nursing Facility Barriers to Discharge: Continued Medical Work up, SNF Pending bed offer   Patient Goals and CMS Choice     Choice offered to / list presented to : [facility] POA / Guardian (sister/legal guardian Meriam Sprague)      Expected Discharge Plan and Services In-house Referral: Clinical Social Work   Post Acute Care Choice: Skilled Nursing Facility Living arrangements for the past 2 months: Skilled Nursing Facility ([facility] term [facility])                                      Prior Living Arrangements/Services Living arrangements for the past 2 months: Skilled Nursing Facility ([facility]) Lives with:: Facility Resident Patient language and need for interpreter reviewed:: No        Need for Family Participation in Patient Care: Yes (Comment) Care giver support system in place?: Yes (comment) Current home services: Other (comment) (na) Criminal Activity/Legal Involvement Pertinent to Current Situation/Hospitalization: No - Comment as needed  Activities of Daily Living      Permission Sought/Granted                  Emotional Assessment       Orientation: : Oriented to Self      Admission diagnosis:  Femoral neck fracture (HCC) [S72.009A] Patient Active Problem List   Diagnosis Date Noted   Malnutrition of moderate degree 02/28/2023   Hip fracture, left (HCC) 02/26/2023   Femoral neck fracture (HCC) 02/26/2023   SIRS (systemic inflammatory response syndrome)  (HCC) 12/26/2022   Murmur, cardiac 09/20/2022   Encounter for screening fecal occult blood testing 10/31/2021   Encounter for gynecological examination 10/31/2021   Acute metabolic encephalopathy 04/06/2020   Rhabdomyolysis 04/06/2020   Elevated troponin 04/06/2020   Pneumonia 04/06/2020   Lactic acidosis 04/06/2020   Suspected Sepsis (HCC) 04/06/2020   Possible NSTEMI (non-ST elevated myocardial infarction) (HCC) 04/06/2020    Suspected Overdose, undetermined intent, initial encounter 04/06/2020   Schizoaffective disorder, bipolar type (HCC)    Bipolar affective disorder, current episode manic with psychotic symptoms (HCC) 12/25/2018   ESRD on hemodialysis (HCC) 12/25/2018   History of adenomatous polyp of colon 11/20/2018   Loss of weight 10/17/2017   Hypernatremia 04/17/2016   CKD (chronic kidney disease) stage 3, GFR 30-59 ml/min (HCC) 04/16/2016   Normocytic anemia 04/16/2016   Meningioma (HCC) 04/16/2016   Acute psychosis (HCC) 04/15/2016   Hyponatremia 04/15/2016   AKI (acute kidney injury) (HCC) 04/15/2016   Special screening for malignant neoplasms, colon    Thrombocytopenia (HCC) 01/05/2016   Altered mental status 04/19/2015   PCP:  Charlynne Pander, MD Pharmacy:   [facility] - [place], [place] - [address] [place] [address] Phone: [phone] Fax: [phone]  [facility] [address]  [address] [place] [address] Phone: [phone] Fax: [phone]  [facility] [place], [place] - [address] [place] [address] Phone: [phone] Fax: [phone]     Social Determinants of Health (SDOH) Social History: SDOH Screenings   Alcohol Screen: Low Risk  (12/25/2018)  Tobacco Use: Low Risk  (03/04/2023)   SDOH Interventions:     Readmission Risk Interventions    12/27/2022    8:27 AM  Readmission Risk Prevention Plan  Transportation  Screening Complete  HRI or Home Care Consult Complete  Social Work Consult for Recovery Care Planning/Counseling Complete  Palliative Care Screening Not Applicable  Medication Review Oceanographer) Complete

## 2023-03-07 NOTE — Discharge Instructions (Signed)
Orthopaedic Trauma Service Discharge Instructions   General Discharge Instructions  Orthopaedic Injuries:  Left femoral neck fracture treated with left hip hemiarthroplasty  WEIGHT BEARING STATUS: Weightbearing as tolerated with assistance such as a walker  RANGE OF MOTION/ACTIVITY: Posterior precautions for 12 weeks.  Slowly increase activity  Bone health  Review the following resource for additional information regarding bone health  [email or web address]  Wound Care: Daily wound care as needed.  Can change dressing daily and reapply a silicone foam dressing such as a Mepilex.  Alternatively you can use 4 x 4 gauze and tape.  Okay to leave incision open to the air once there is no drainage.  Okay to clean wounds with soap and water only.  Do not apply any lotions or ointments or solutions  Diet: as you were eating previously.  Can use over the counter stool softeners and bowel preparations, such as Miralax, to help with bowel movements.  Narcotics can be constipating.  Be sure to drink plenty of fluids  PAIN MEDICATION USE AND EXPECTATIONS  You have likely been given narcotic medications to help control your pain.  After a traumatic event that results in an fracture (broken bone) with or without surgery, it is ok to use narcotic pain medications to help control one's pain.  We understand that everyone responds to pain differently and each individual patient will be evaluated on a regular basis for the continued need for narcotic medications. Ideally, narcotic medication use should last no more than 6-8 weeks (coinciding with fracture healing).   As a patient it is your responsibility as well to monitor narcotic medication use and report the amount and frequency you use these medications when you come to your office visit.   We would also advise that if you are using narcotic medications, you should take a dose prior to therapy to maximize you participation.  IF  YOU ARE ON NARCOTIC MEDICATIONS IT IS NOT PERMISSIBLE TO OPERATE A MOTOR VEHICLE (MOTORCYCLE/CAR/TRUCK/MOPED) OR HEAVY MACHINERY DO NOT MIX NARCOTICS WITH OTHER CNS (CENTRAL NERVOUS SYSTEM) DEPRESSANTS SUCH AS ALCOHOL   POST-OPERATIVE OPIOID TAPER INSTRUCTIONS: It is important to wean off of your opioid medication as soon as possible. If you do not need pain medication after your surgery it is ok to stop day one. Opioids include: Codeine, Hydrocodone(Norco, Vicodin), Oxycodone(Percocet, oxycontin) and hydromorphone amongst others.  Long term and even short term use of opiods can cause: Increased pain response Dependence Constipation Depression Respiratory depression And more.  Withdrawal symptoms can include Flu like symptoms Nausea, vomiting And more Techniques to manage these symptoms Hydrate well Eat regular healthy meals Stay active Use relaxation techniques(deep breathing, meditating, yoga) Do Not substitute Alcohol to help with tapering If you have been on opioids for less than two weeks and do not have pain than it is ok to stop all together.  Plan to wean off of opioids This plan should start within one week post op of your fracture surgery  Maintain the same interval or time between taking each dose and first decrease the dose.  Cut the total daily intake of opioids by one tablet each day Next start to increase the time between doses. The last dose that should be eliminated is the evening dose.    STOP SMOKING OR USING NICOTINE PRODUCTS!!!!  As discussed nicotine severely impairs your body's ability to heal surgical and traumatic wounds but also impairs bone healing.  Wounds and bone heal by forming microscopic blood vessels (angiogenesis) and nicotine is  a vasoconstrictor (essentially, shrinks blood vessels).  Therefore, if vasoconstriction occurs to these microscopic blood vessels they essentially disappear and are unable to deliver necessary nutrients to the healing  tissue.  This is one modifiable factor that you can do to dramatically increase your chances of healing your injury.    (This means no smoking, no nicotine gum, patches, etc)  DO NOT USE NONSTEROIDAL ANTI-INFLAMMATORY DRUGS (NSAID'S)  Using products such as Advil (ibuprofen), Aleve (naproxen), Motrin (ibuprofen) for additional pain control during fracture healing can delay and/or prevent the healing response.  If you would like to take over the counter (OTC) medication, Tylenol (acetaminophen) is ok.  However, some narcotic medications that are given for pain control contain acetaminophen as well. Therefore, you should not exceed more than 4000 mg of tylenol in a day if you do not have liver disease.  Also note that there are may OTC medicines, such as cold medicines and allergy medicines that my contain tylenol as well.  If you have any questions about medications and/or interactions please ask your doctor/PA or your pharmacist.      ICE AND ELEVATE INJURED/OPERATIVE EXTREMITY  Using ice and elevating the injured extremity above your heart can help with swelling and pain control.  Icing in a pulsatile fashion, such as 20 minutes on and 20 minutes off, can be followed.    Do not place ice directly on skin. Make sure there is a barrier between to skin and the ice pack.    Using frozen items such as frozen peas works well as the conform nicely to the are that needs to be iced.  USE AN ACE WRAP OR TED HOSE FOR SWELLING CONTROL  In addition to icing and elevation, Ace wraps or TED hose are used to help limit and resolve swelling.  It is recommended to use Ace wraps or TED hose until you are informed to stop.    When using Ace Wraps start the wrapping distally (farthest away from the body) and wrap proximally (closer to the body)   Example: If you had surgery on your leg or thing and you do not have a splint on, start the ace wrap at the toes and work your way up to the thigh        If you had surgery on  your upper extremity and do not have a splint on, start the ace wrap at your fingers and work your way up to the upper arm  IF YOU ARE IN A SPLINT OR CAST DO NOT REMOVE IT FOR ANY REASON   If your splint gets wet for any reason please contact the office immediately. You may shower in your splint or cast as long as you keep it dry.  This can be done by wrapping in a cast cover or garbage back (or similar)  Do Not stick any thing down your splint or cast such as pencils, money, or hangers to try and scratch yourself with.  If you feel itchy take benadryl as prescribed on the bottle for itching  IF YOU ARE IN A CAM BOOT (BLACK BOOT)  You may remove boot periodically. Perform daily dressing changes as noted below.  Wash the liner of the boot regularly and wear a sock when wearing the boot. It is recommended that you sleep in the boot until told otherwise    Call office for the following: Temperature greater than 101F Persistent nausea and vomiting Severe uncontrolled pain Redness, tenderness, or signs of infection (pain,  swelling, redness, odor or green/yellow discharge around the site) Difficulty breathing, headache or visual disturbances Hives Persistent dizziness or light-headedness Extreme fatigue Any other questions or concerns you may have after discharge  In an emergency, call 911 or go to an Emergency Department at a nearby hospital  HELPFUL INFORMATION  If you had a block, it will wear off between 8-24 hrs postop typically.  This is period when your pain may go from nearly zero to the pain you would have had postop without the block.  This is an abrupt transition but nothing dangerous is happening.  You may take an extra dose of narcotic when this happens.  You should wean off your narcotic medicines as soon as you are able.  Most patients will be off or using minimal narcotics before their first postop appointment.   We suggest you use the pain medication the first night prior to  going to bed, in order to ease any pain when the anesthesia wears off. You should avoid taking pain medications on an empty stomach as it will make you nauseous.  Do not drink alcoholic beverages or take illicit drugs when taking pain medications.  In most states it is against the law to drive while you are in a splint or sling.  And certainly against the law to drive while taking narcotics.  You may return to work/school in the next couple of days when you feel up to it.   Pain medication may make you constipated.  Below are a few solutions to try in this order: Decrease the amount of pain medication if you aren't having pain. Drink lots of decaffeinated fluids. Drink prune juice and/or each dried prunes  If the first 3 don't work start with additional solutions Take Colace - an over-the-counter stool softener Take Senokot - an over-the-counter laxative Take Miralax - a stronger over-the-counter laxative     CALL THE OFFICE WITH ANY QUESTIONS OR CONCERNS: [phone]   VISIT OUR WEBSITE FOR ADDITIONAL INFORMATION: [email or web address]      Discharge Pin Site Instructions  Dress pins daily with Kerlix roll starting on POD 2. Wrap the Kerlix so that it tamps the skin down around the pin-skin interface to prevent/limit motion of the skin relative to the pin.  (Pin-skin motion is the primary cause of pain and infection related to external fixator pin sites).  Remove any crust or coagulum that may obstruct drainage with soap and water.  After POD 3, if there is no discernable drainage on the pin site dressing, the interval for change can by increased to every other day.  You may shower with the fixator, cleaning all pin sites gently with soap and water.  If you have a surgical wound this needs to be completely dry and without drainage before showering. Alternatively you can use a washcloth with soap and water and gently clean the injured extremity and external fixator, including all  pinsites and surgical wounds   The extremity can be lifted by the fixator to facilitate wound care and transfers.  Notify the office/Doctor if you experience increasing drainage, redness, or pain from a pin site, or if you notice purulent (thick, snot-like) drainage. As we discussed pin tract infections are common in this is most likely as a result of mechanical irritation from the skin pin interface. Primary treatment is hygiene and cleaning with soap and water. If this does not resolve with regular cleaning contact the office  Discharge Wound Care Instructions  Do NOT apply any ointments,  solutions or lotions to pin sites or surgical wounds.  These prevent needed drainage and even though solutions like hydrogen peroxide kill bacteria, they also damage cells lining the pin sites that help fight infection.  Applying lotions or ointments can keep the wounds moist and can cause them to breakdown and open up as well. This can increase the risk for infection. When in doubt call the office.  Surgical incisions should be dressed daily.  If any drainage is noted, use one layer of adaptic or Mepitel, then gauze, Kerlix, and an ace wrap.  NetCamper.cz [email or web address]-[email or web address]/?pd_rd_[email or web address]&[email or web address]  [email or web address]  These dressing supplies should be available at local medical supply stores ([employer], [employer], etc). They are not usually carried at places like [facility], [facility], [facility], etc  Once the incision is completely dry and without drainage, it may be left open to air out.  Showering may begin 36-48 hours later.  Cleaning gently with soap and water.  Traumatic wounds should be dressed daily as well.    One layer of adaptic, gauze, Kerlix, then ace wrap.  The adaptic can be discontinued once  the draining has ceased    If you have a wet to dry dressing: wet the gauze with saline the squeeze as much saline out so the gauze is moist (not soaking wet), place moistened gauze over wound, then place a dry gauze over the moist one, followed by Kerlix wrap, then ace wrap.

## 2023-03-07 NOTE — Progress Notes (Signed)
Orthopaedic Trauma Service Progress Note  Patient ID: Madison [name] MRN: [id number] DOB/AGE: 1951/03/22 72 y.o.  Subjective:  Ortho issues stable  Discussed transfusion 1 unit of PRBCs with medicine and renal team this am given drop in h/h and tachycardia  1 unit ordered    ROS As above  Objective:   VITALS:   Vitals:   03/06/23 1941 03/06/23 2202 03/07/23 0512 03/07/23 0901  BP: 119/64 120/70 138/71 118/67  Pulse: (!) 120 (!) 117 (!) 116   Resp: 18 16 15 18   Temp: 99.4 F (37.4 C) 98.9 F (37.2 C) 98.5 F (36.9 C) 98 F (36.7 C)  TempSrc:  Oral    SpO2: 96% 97% 98% 100%  Weight:      Height:        Estimated body mass index is 18.28 kg/m as calculated from the following:   Height as of this encounter: 5\' 4"  (1.626 m).   Weight as of this encounter: 48.3 kg.   Intake/Output      06/12 0701 06/13 0700 06/13 0701 06/14 0700   P.O. 890    Total Intake(mL/kg) 890 (18.4)    Other 700    Total Output 700    Net +190           LABS  Results for orders placed or performed during the hospital encounter of 02/26/23 (from the past 24 hour(s))  CBC with Differential/Platelet     Status: Abnormal   Collection Time: 03/07/23  2:51 AM  Result Value Ref Range   WBC 10.5 4.0 - 10.5 K/uL   RBC 2.54 (L) 3.87 - 5.11 MIL/uL   Hemoglobin 7.6 (L) 12.0 - 15.0 g/dL   HCT 78.2 (L) 95.6 - 21.3 %   MCV 93.3 80.0 - 100.0 fL   MCH 29.9 26.0 - 34.0 pg   MCHC 32.1 30.0 - 36.0 g/dL   RDW 08.6 (H) 57.8 - 46.9 %   Platelets 305 150 - 400 K/uL   nRBC 0.0 0.0 - 0.2 %   Neutrophils Relative % 64 %   Neutro Abs 6.8 1.7 - 7.7 K/uL   Lymphocytes Relative 16 %   Lymphs Abs 1.7 0.7 - 4.0 K/uL   Monocytes Relative 15 %   Monocytes Absolute 1.5 (H) 0.1 - 1.0 K/uL   Eosinophils Relative 3 %   Eosinophils Absolute 0.3 0.0 - 0.5 K/uL   Basophils Relative 1 %   Basophils Absolute 0.1 0.0 - 0.1 K/uL   Immature  Granulocytes 1 %   Abs Immature Granulocytes 0.07 0.00 - 0.07 K/uL  Comprehensive metabolic panel     Status: Abnormal   Collection Time: 03/07/23  2:51 AM  Result Value Ref Range   Sodium 132 (L) 135 - 145 mmol/L   Potassium 4.5 3.5 - 5.1 mmol/L   Chloride 90 (L) 98 - 111 mmol/L   CO2 26 22 - 32 mmol/L   Glucose, Bld 103 (H) 70 - 99 mg/dL   BUN 36 (H) 8 - 23 mg/dL   Creatinine, Ser 6.29 (H) 0.44 - 1.00 mg/dL   Calcium 9.8 8.9 - 52.8 mg/dL   Total Protein 6.4 (L) 6.5 - 8.1 g/dL   Albumin 2.3 (L) 3.5 - 5.0 g/dL   AST 21 15 - 41 U/L   ALT 6 0 -  44 U/L   Alkaline Phosphatase 50 38 - 126 U/L   Total Bilirubin 0.7 0.3 - 1.2 mg/dL   GFR, Estimated 9 (L) >60 mL/min   Anion gap 16 (H) 5 - 15  Magnesium     Status: None   Collection Time: 03/07/23  2:51 AM  Result Value Ref Range   Magnesium 1.7 1.7 - 2.4 mg/dL  Phosphorus     Status: None   Collection Time: 03/07/23  2:51 AM  Result Value Ref Range   Phosphorus 4.3 2.5 - 4.6 mg/dL     PHYSICAL EXAM:   Gen: sitting up in bed, Calm  Ext:       Left Lower extremity              Dressing L hip clean, dry and intact             Distal motor and sensory functions grossly intact             No DCT              Compartments are soft              exam stable  Assessment/Plan: 7 Days Post-Op    Anti-infectives (From admission, onward)    Start     Dose/Rate Route Frequency Ordered Stop   02/28/23 1430  ceFAZolin (ANCEF) IVPB 2g/100 mL premix  Status:  Discontinued        2 g 200 mL/hr over 30 Minutes Intravenous Every 6 hours 02/28/23 1334 02/28/23 1344   02/28/23 1147  vancomycin (VANCOCIN) powder  Status:  Discontinued          As needed 02/28/23 1147 02/28/23 1216   02/28/23 0830  ceFAZolin (ANCEF) IVPB 2g/100 mL premix        2 g 200 mL/hr over 30 Minutes Intravenous On call to O.R. 02/28/23 0731 02/28/23 1033   02/28/23 0830  vancomycin (VANCOCIN) IVPB 1000 mg/200 mL premix        1,000 mg 200 mL/hr over 60 Minutes  Intravenous On call to O.R. 02/28/23 8295 02/28/23 0858     .  POD/HD#: 69  72 y/o female s/p fall with Left femoral neck fracture s/p Left hip hemiarthroplasty    -L femoral neck fracture s/p left hip hemiarthroplasty  Weightbearing WBAT L leg with assistance               ROM/Activity                         Posterior hip precautions x 12 weeks                Wound care                         Daily wound care as needed    Clean with soap and water only    PT and OT Ice prn   - L ankle pain             Xrays negative   - L knee pain/chronic swelling             knee films negative   - Pain management:             Multimodal             Minimize narcotics   - ABL anemia/Hemodynamics  Monitor  Transfuse 1 unit PRBCs    - Medical issues              Per primary              - DVT/PE prophylaxis:             Currently on sq heparin             Would be inclined to do sq heparin for total of 28 days post op               - ID:              Periop abx completed   - Metabolic Bone Disease:             Due to renal dz   - Dispo:             Therapies             Snf              Follow up with ortho 10 days  Mearl Latin, PA-C [phone] (C) 03/07/2023, 9:52 AM  [facility] [address] [place] [address] [phone] [phone] (F)    After 5pm and on the weekends please log on to Amion, go to orthopaedics and the look under the [facility] Call for the provider(s) on call. You can also call our office at [phone] and then follow the prompts to be connected to the call team.  Patient ID: Madison [name], female   DOB: 16-Oct-1950, 72 y.o.   MRN: [id number]

## 2023-03-07 NOTE — Progress Notes (Addendum)
Patient ID: Madison [name], female   DOB: Feb 19, 1951, 72 y.o.   MRN: [id number] [facility] Progress Note   Assessment/ Plan:   1.  Status post hemiarthroplasty for left hip fracture: Postoperative day #6 and awaiting outpatient placement to skilled nursing facility along with recommendations by orthopedic surgery. 2. ESRD: Continue hemodialysis on a Monday/Wednesday/Friday schedule with next hemodialysis treatment scheduled for tomorrow if she is still here, otherwise she will resume her outpatient dialysis at [facility]. 3. Anemia: Hemoglobin and hematocrit are low as expected postoperatively, continue to monitor with ESA. 1 unit PRBC ordered by orthopedic surgery. 4. CKD-MBD: Phosphorus level improving with adjustment of sevelamer.  Calcium level acceptable. 5. Nutrition: Continue renal diet/nutritional supplementation. 6. Hypertension: Blood pressure currently at goal, monitor with hemodialysis/UF.  Subjective:   Remains emotionally labile/easily tearful without any specific complaints to elaborate upon.   Objective:   BP 118/67 (BP Location: Right Arm)   Pulse (!) 116   Temp 98 F (36.7 C)   Resp 18   Ht 5\' 4"  (1.626 m)   Wt 48.3 kg   SpO2 100%   BMI 18.28 kg/m   Physical Exam: Gen: Comfortably resting in bed but immediately starts crying upon conversation CVS: Pulse regular rhythm, mildly tachycardic, S1 and S2 normal Resp: Clear to auscultation bilaterally, no distinct rales or rhonchi Abd: Soft, flat, nontender, bowel sounds normal Ext: No lower extremity edema, left upper arm fistula with intact dressing  Labs: BMET Recent Labs  Lab 02/28/23 1639 03/01/23 0809 03/03/23 0135 03/06/23 0630 03/07/23 0251  NA 133* 134* 137 135 132*  K 5.6* 4.9 5.0 4.7 4.5  CL 93* 93* 93* 92* 90*  CO2 23 22 25 24 26   GLUCOSE 150* 107* 85 83 103*  BUN 46* 61* 47* 53* 36*  CREATININE 7.79* 9.11* 7.36* 7.31* 4.73*  CALCIUM 9.3 9.5 10.0 9.9 9.8  PHOS 7.5* 7.8* 6.1*  7.0* 4.3   CBC Recent Labs  Lab 03/02/23 0109 03/03/23 0135 03/06/23 0630 03/07/23 0251  WBC 8.9 7.1 9.2 10.5  NEUTROABS  --   --   --  6.8  HGB 9.1* 9.6* 7.9* 7.6*  HCT 28.1* 29.3* 24.7* 23.7*  MCV 95.6 93.6 94.3 93.3  PLT 247 242 355 305      Medications:     acetaminophen  650 mg Oral TID   benztropine  0.5 mg Oral BID   Chlorhexidine Gluconate Cloth  6 each Topical Q0600   darbepoetin (ARANESP) injection - NON-DIALYSIS  60 mcg Subcutaneous Q Mon-1800   docusate sodium  100 mg Oral BID   feeding supplement (NEPRO CARB STEADY)  237 mL Oral BID BM   heparin  5,000 Units Subcutaneous Q8H   mirtazapine  15 mg Oral QHS   multivitamin  1 tablet Oral QHS   pantoprazole  40 mg Oral Daily   senna  1 tablet Oral BID   sevelamer carbonate  1,600 mg Oral TID WC   valproic acid  250 mg Oral TID WC   Zetta Bills, MD 03/07/2023, 10:06 AM

## 2023-03-07 NOTE — Care Management Important Message (Signed)
Important Message  Patient Details  Name: LUNDEN STIEBER MRN: [id number] Date of Birth: 1951/03/01   Medicare Important Message Given:  Yes     Sherilyn Banker 03/07/2023, 12:39 PM

## 2023-03-07 NOTE — Progress Notes (Signed)
Contacted team this morning to inquire if pt has been out of the bed to chair. Pt will need to be able to tolerate sitting in chair for 4-5 hrs to be appropriate for out-pt HD at d/c. Plan is for pt to receive HD in chair tomorrow to ensure pt can tolerate. Will assist as needed.   Olivia Canter Renal Navigator  [phone]

## 2023-03-08 DIAGNOSIS — R000 Tachycardia, unspecified: Secondary | ICD-10-CM

## 2023-03-08 DIAGNOSIS — N186 End stage renal disease: Secondary | ICD-10-CM | POA: Diagnosis not present

## 2023-03-08 DIAGNOSIS — F250 Schizoaffective disorder, bipolar type: Secondary | ICD-10-CM | POA: Diagnosis not present

## 2023-03-08 DIAGNOSIS — E440 Moderate protein-calorie malnutrition: Secondary | ICD-10-CM | POA: Diagnosis not present

## 2023-03-08 DIAGNOSIS — S72002D Fracture of unspecified part of neck of left femur, subsequent encounter for closed fracture with routine healing: Secondary | ICD-10-CM | POA: Diagnosis not present

## 2023-03-08 LAB — COMPREHENSIVE METABOLIC PANEL
ALT: <5 U/L (ref 0–44)
AST: 20 U/L (ref 15–41)
Albumin: 2.5 g/dL — ABNORMAL LOW (ref 3.5–5.0)
Alkaline Phosphatase: 62 U/L (ref 38–126)
Anion gap: 17 — ABNORMAL HIGH (ref 5–15)
BUN: 58 mg/dL — ABNORMAL HIGH (ref 8–23)
CO2: 24 mmol/L (ref 22–32)
Calcium: 10.1 mg/dL (ref 8.9–10.3)
Chloride: 88 mmol/L — ABNORMAL LOW (ref 98–111)
Creatinine, Ser: 6.81 mg/dL — ABNORMAL HIGH (ref 0.44–1.00)
GFR, Estimated: 6 mL/min — ABNORMAL LOW (ref >60)
Glucose, Bld: 82 mg/dL (ref 70–99)
Potassium: 5.3 mmol/L — ABNORMAL HIGH (ref 3.5–5.1)
Sodium: 129 mmol/L — ABNORMAL LOW (ref 135–145)
Total Bilirubin: 0.6 mg/dL (ref 0.3–1.2)
Total Protein: 6.6 g/dL (ref 6.5–8.1)

## 2023-03-08 LAB — CBC WITH DIFFERENTIAL/PLATELET
Abs Immature Granulocytes: 0.09 10*3/uL — ABNORMAL HIGH (ref 0.00–0.07)
Basophils Absolute: 0.1 10*3/uL (ref 0.0–0.1)
Basophils Relative: 1 %
Eosinophils Absolute: 0.3 10*3/uL (ref 0.0–0.5)
Eosinophils Relative: 3 %
HCT: 29.3 % — ABNORMAL LOW (ref 36.0–46.0)
Hemoglobin: 9.4 g/dL — ABNORMAL LOW (ref 12.0–15.0)
Immature Granulocytes: 1 %
Lymphocytes Relative: 14 %
Lymphs Abs: 1.4 10*3/uL (ref 0.7–4.0)
MCH: 28.6 pg (ref 26.0–34.0)
MCHC: 32.1 g/dL (ref 30.0–36.0)
MCV: 89.1 fL (ref 80.0–100.0)
Monocytes Absolute: 0.9 10*3/uL (ref 0.1–1.0)
Monocytes Relative: 10 %
Neutro Abs: 6.7 10*3/uL (ref 1.7–7.7)
Neutrophils Relative %: 71 %
Platelets: 335 10*3/uL (ref 150–400)
RBC: 3.29 MIL/uL — ABNORMAL LOW (ref 3.87–5.11)
RDW: 19.9 % — ABNORMAL HIGH (ref 11.5–15.5)
WBC: 9.5 10*3/uL (ref 4.0–10.5)
nRBC: 0 % (ref 0.0–0.2)

## 2023-03-08 LAB — BPAM RBC: Blood Product Expiration Date: 202407122359

## 2023-03-08 LAB — TYPE AND SCREEN

## 2023-03-08 LAB — MAGNESIUM: Magnesium: 1.8 mg/dL (ref 1.7–2.4)

## 2023-03-08 LAB — PHOSPHORUS: Phosphorus: 7.5 mg/dL — ABNORMAL HIGH (ref 2.5–4.6)

## 2023-03-08 MED ORDER — HEPARIN SODIUM (PORCINE) 1000 UNIT/ML DIALYSIS
40.0000 [IU]/kg | INTRAMUSCULAR | Status: DC | PRN
  Administered 2023-03-08: 1900 [IU] via INTRAVENOUS_CENTRAL
  Filled 2023-03-08: qty 2

## 2023-03-08 MED ORDER — METOPROLOL TARTRATE 12.5 MG HALF TABLET
12.5000 mg | ORAL_TABLET | Freq: Two times a day (BID) | ORAL | Status: DC
  Administered 2023-03-08 – 2023-03-12 (×6): 12.5 mg via ORAL
  Filled 2023-03-08 (×9): qty 1

## 2023-03-08 MED ORDER — ORAL CARE MOUTH RINSE: 15.0000 mL | OROMUCOSAL | Status: DC | PRN

## 2023-03-08 MED ORDER — METOPROLOL TARTRATE 5 MG/5ML IV SOLN
5.0000 mg | Freq: Once | INTRAVENOUS | Status: AC
  Administered 2023-03-08: 5 mg via INTRAVENOUS
  Filled 2023-03-08: qty 5

## 2023-03-08 MED ORDER — ORAL CARE MOUTH RINSE
15.0000 mL | OROMUCOSAL | Status: DC
  Administered 2023-03-08 – 2023-03-25 (×44): 15 mL via OROMUCOSAL

## 2023-03-08 NOTE — Progress Notes (Signed)
Orthopedic Tech Progress Note Patient Details:  Madison [name] [birth year] [id number]  Ortho Devices Type of Ortho Device: Abduction pillow Ortho Device/Splint Interventions: Ordered, Application, Adjustment   Post Interventions Patient Tolerated: Poor Abd pillow applied with help from RN. Darleen Crocker 03/08/2023, 7:13 PM

## 2023-03-08 NOTE — Progress Notes (Signed)
MEWS Progress Note  Patient Details Name: Madison [name] MRN: [id number] DOB: Apr 09, 1951 Today's Date: 03/08/2023   MEWS Flowsheet Documentation:  Assess: MEWS Score Temp: 99.7 F (37.6 C) BP: 127/69 MAP (mmHg): 86 Pulse Rate: (!) 124 ECG Heart Rate: (!) 120 Resp: (!) 36 Level of Consciousness: Alert SpO2: 98 % O2 Device: Room Air Patient Activity (if Appropriate): In chair O2 Flow Rate (L/min): 2 L/min Assess: MEWS Score MEWS Temp: 0 MEWS Systolic: 0 MEWS Pulse: 2 MEWS RR: 3 MEWS LOC: 0 MEWS Score: 5 MEWS Score Color: Red Assess: SIRS CRITERIA SIRS Temperature : 0 SIRS Respirations : 1 SIRS Pulse: 1 SIRS WBC: 0 SIRS Score Sum : 2 SIRS Temperature : 0 SIRS Pulse: 1 SIRS Respirations : 1 SIRS WBC: 0 SIRS Score Sum : 2 Assess: if the MEWS score is Yellow or Red Were vital signs taken at a resting state?: Yes Focused Assessment: No change from prior assessment Does the patient meet 2 or more of the SIRS criteria?: Yes Does the patient have a confirmed or suspected source of infection?: No MEWS guidelines implemented : Yes, red Treat MEWS Interventions: Considered administering scheduled or prn medications/treatments as ordered Take Vital Signs Increase Vital Sign Frequency : Red: Q1hr x2, continue Q4hrs until patient remains green for 12hrs Escalate MEWS: Escalate: Red: Discuss with charge nurse and notify provider. Consider notifying RRT. If remains red for 2 hours consider need for higher level of care Notify: Charge Nurse/RN Name of Charge Nurse/RN Notified: Lauren, Charity fundraiser. Provider Notification Provider Name/Title: MD Marland Mcalpine Date Provider Notified: 03/08/23 Time Provider Notified: 1425 Method of Notification:  (secure chat) Notification Reason: Other (Comment) (Tachycardia and Tachypneic post HD) Medical equipment refused/Pt. educated regarding refusal: Other (Comment) (refused type and screen for blood transfusion) Provider response: No new orders (provider  said to document occurence) Date of Provider Response: 03/08/23 Time of Provider Response: 1430      Kavin Leech A 03/08/2023, 2:52 PM

## 2023-03-08 NOTE — Procedures (Signed)
Patient seen on Hemodialysis-- she is in a recliner and appears to be tolerating it well at the 30 minute mark. BP 136/78   Pulse (!) 101   Temp 97.6 F (36.4 C) (Oral)   Resp (!) 25   Ht 5\' 4"  (1.626 m)   Wt 48.3 kg   SpO2 99%   BMI 18.28 kg/m   QB 400, UF goal 2L No changes to dialysis prescription at this time.   Zetta Bills MD [facility]. Office # [phone] Pager # [phone] 8:43 AM

## 2023-03-08 NOTE — Progress Notes (Signed)
PROGRESS NOTE    Madison [name]  ZOX:[id number] DOB: 05/25/1951 DOA: 02/26/2023 PCP: Charlynne Pander, MD   Brief Narrative:  The patient is a 72 year old SNF resident with past medical history significant for but limited to ESRD on hemodialysis Monday Wednesday Friday, hypertension, history of schizophrenia versus bipolar disorder as well as other comorbidities who presented to the [facility] ER after a fall which occurred multiple days prior.  X-rays at [facility] ER revealed a left femoral fracture and she was transferred to [facility] given that she needed a combination of the orthopedist as well as inpatient dialysis.  She underwent unipolar Hemi arthroplasty of the left hip on 02/28/2023 and has not been deemed stable from a orthopedic standpoint.  She had issues with her dialysis catheter which has not been fixed and she is getting dialysis.  She was extremely agitated and hysterically crying yesterday and remains emotionally labile and tearful however was calmer.  We were anticipating discharging back to SNF in next 24 to 48 hours with resumption of her normal dialysis session on Friday, 03/08/2023 however a barrier to discharge is that the patient will need to be able to tolerate sitting in a chair for 4 to 5 hours to be appropriate for outpatient HD for discharge and the plan is for her to receive hemodialysis in the chair tomorrow to ensure that patient can tolerate  Assessment and Plan:  Left Femoral Neck Fracture -Further Care per Orthopedic Surgery - s/p unipolar hemiarthroplasty of the left hip 02/28/2023  -Appears to be stable from an orthopedic standpoint they are recommending weightbearing as tolerated on the left lower extremity with assistance and posterior hip precautions for 12 weeks with daily wound care as needed and VTE prophylaxis with subcu heparin for 28 days -C/w with pain control and she is on oxycodone 5 to 10 mg every 4 as needed for moderate pain and acetaminophen 660 mg p.o. 3  times daily, and on Fentanyl 12.5 mcg every 2 hours as needed for severe pain -Continue with bowel regimen with Senokot 8.6 mg p.o. twice daily, docusate 100 mL p.o. twice daily  -DG Knee done and showed "No evidence of acute osseous abnormality. Stable degenerative change." -Given hip rotation orthopedic surgery is obtaining an x-ray -Getting surgery recommending following up in 10 days after discharge              Schizoaffective Disorder, Bipolar Type with uncontrollable crying and agitation today -Continue usual home medical regimen and continue with aripiprazole 50 mg p.o. hemodialysis center history, benztropine 0.5 mg p.o. twice daily and continue Valproic Acid 250 mg po TID -Patient was hysterically crying and extremely agitated and anxious today -Continue Mirtazapine 15 mg p.o. nightly -Utilize as needed Haldol for agitation and resumed yesterday   ESRD on HD MWF Hyperphosphatemia, improved with Dialysis  Elevated Anion Gap -Ongoing issues with dialysis cannulation now corrected status post PTA to the left brachial vein outflow stenosis to 14 mm by the interventional radiology team Dr. Loreta Ave on 03/04/2023 -BUN/Cr Trend: Recent Labs  Lab 02/27/23 0650 02/28/23 0758 02/28/23 1639 03/01/23 0809 03/03/23 0135 03/06/23 0630 03/07/23 0251  BUN 83* 43* 46* 61* 47* 53* 36*  CREATININE 11.90* 7.40* 7.79* 9.11* 7.36* 7.31* 4.73*  -Phos Level Trend: Recent Labs  Lab 02/27/23 0650 02/28/23 1639 03/01/23 0809 03/03/23 0135 03/06/23 0630 03/07/23 0251  PHOS 6.6* 7.5* 7.8* 6.1* 7.0* 4.3  -Patient has an elevated anion gap of 16, CO2 26, chloride level 90 -Further Care per  Nephrology and she needs to dialyze in the chair prior to being discharged and was dialyzed in the bed so we will attempt to set her up in the chair tomorrow for her to resume her outpatient dialysis Severe -C/w Sevelamer Carbonate 1600 mg po TID -Avoid Nephrotoxic Medications, Contrast Dyes, Hypotension and  Dehydration to Ensure Adequate Renal Perfusion and will need to Renally Adjust Meds -Continue to Monitor and Trend Renal Function carefully and repeat CMP in the AM   Hyponatremia -Na+ Trend: Recent Labs  Lab 02/27/23 0650 02/28/23 0758 02/28/23 1639 03/01/23 0809 03/03/23 0135 03/06/23 0630 03/07/23 0251  NA 136 133* 133* 134* 137 135 132*  -Continue to Monitor and Trend and Repeat CMP within 1 week  Anemia of Chronic Kidney Disease compounded by Postoperative Anemia Hemoglobin has stabilized at approximately 9 the last few days but dropped a little today -Receives Darbepoetin Alfa 60 mcg sq qMon -Hgb/Hct Trend: Recent Labs  Lab 02/27/23 0650 02/28/23 0758 03/01/23 0809 03/02/23 0109 03/03/23 0135 03/06/23 0630 03/07/23 0251  HGB 9.7* 11.6* 9.0* 9.1* 9.6* 7.9* 7.6*  HCT 30.9* 34.0* 28.0* 28.1* 29.3* 24.7* 23.7*  MCV 94.8  --  92.7 95.6 93.6 94.3 93.3  -Orthopedic surgery is typed and screened and transfusing 1 unit of PRBCs given her drop in hemoglobin hematocrit and tachycardia -Continue to monitor for signs and symptoms of bleeding; no overt bleeding noted -Repeat CBC in the AM   Malnutrition of Moderate Degree  -Related to chronic illness (ESRD, schizoaffective disorder) as evidenced by moderate fat depletion, severe fat depletion, moderate muscle depletion -Nutrition Status: Nutrition Problem: Moderate Malnutrition Etiology: chronic illness (ESRD, schizoaffective disorder) Signs/Symptoms: moderate fat depletion, severe fat depletion, moderate muscle depletion Interventions: Nepro shake, MVI, Liberalize Diet  GERD/GI Prophylaxis -C/w Pantoprazole 40 mg po Daily  Hypoalbuminemia -Patient's Albumin Trend: Recent Labs  Lab 02/27/23 0650 02/28/23 1639 03/01/23 0809 03/03/23 0135 03/06/23 0630 03/07/23 0251  ALBUMIN 2.5* 3.0* 2.7* 2.4* 2.4* 2.3*  -Continue to Monitor and Trend and repeat CMP in the AM   DVT prophylaxis: heparin injection 5,000 Units Start:  02/28/23 2200 SCDs Start: 02/28/23 1334    Code Status: Full Code Family Communication: No family present at bedside   Disposition Plan:  Level of care: Med-Surg Status is: Inpatient Remains inpatient appropriate because: Anticipating D/C to SNF in the Next 24-48 hours   Consultants:  Nephrology Orthopedic Surgery Interventional Radiology  Procedures:  As delineated as above  Antimicrobials:  Anti-infectives (From admission, onward)    Start     Dose/Rate Route Frequency Ordered Stop   02/28/23 1430  ceFAZolin (ANCEF) IVPB 2g/100 mL premix  Status:  Discontinued        2 g 200 mL/hr over 30 Minutes Intravenous Every 6 hours 02/28/23 1334 02/28/23 1344   02/28/23 1147  vancomycin (VANCOCIN) powder  Status:  Discontinued          As needed 02/28/23 1147 02/28/23 1216   02/28/23 0830  ceFAZolin (ANCEF) IVPB 2g/100 mL premix        2 g 200 mL/hr over 30 Minutes Intravenous On call to O.R. 02/28/23 0731 02/28/23 1033   02/28/23 0830  vancomycin (VANCOCIN) IVPB 1000 mg/200 mL premix        1,000 mg 200 mL/hr over 60 Minutes Intravenous On call to O.R. 02/28/23 9147 02/28/23 0858       Subjective: Seen and examined at bedside and she was sitting in the chair in dialysis appeared calm.  Heart rate was  still very elevated.  She is complaining some pain.  No nausea or vomiting.  No other concerns or complaints this time  Objective: Vitals:   03/08/23 1244 03/08/23 1420 03/08/23 1532 03/08/23 1630  BP: 135/73 127/69 (!) 89/60 109/63  Pulse: (!) 118 (!) 124 (!) 103 (!) 105  Resp: (!) 24 (!) 36 (!) 24 (!) 24  Temp:  99.7 F (37.6 C) 99.2 F (37.3 C) 100 F (37.8 C)  TempSrc:  Oral Oral Oral  SpO2: 100% 98% 100% 99%  Weight:      Height:        Intake/Output Summary (Last 24 hours) at 03/08/2023 1716 Last data filed at 03/08/2023 1400 Gross per 24 hour  Intake 942.5 ml  Output 1900 ml  Net -957.5 ml   Filed Weights   03/04/23 1213 03/04/23 1726 03/05/23 0426  Weight:  49.4 kg 48.3 kg 48.3 kg   Examination: Physical Exam:  Constitutional: Thin cachectic chronically ill-appearing AAF who is calmer  Respiratory: Diminished to auscultation bilaterally, no wheezing, rales, rhonchi or crackles. Normal respiratory effort and patient is not tachypenic. No accessory muscle use. Unlabored breathing  Cardiovascular: Tachycardic Rate but Sinus, no murmurs / rubs / gallops. S1 and S2 auscultated. No extremity edema.  Abdomen: Soft, non-tender, non-distended. Bowel sounds positive.  GU: Deferred. Musculoskeletal: No clubbing / cyanosis of digits/nails. No joint deformity upper and lower extremities.  Skin: No rashes, lesions, ulcers on a limited skin evaluation. No induration; Warm and dry.  Neurologic: CN 2-12 grossly intact with no focal deficits. Romberg sign and cerebellar reflexes not assessed.  Psychiatric: Impaired Judgement and Insight and appears calmer  Data Reviewed: I have personally reviewed following labs and imaging studies  CBC: Recent Labs  Lab 03/02/23 0109 03/03/23 0135 03/06/23 0630 03/07/23 0251 03/08/23 0523  WBC 8.9 7.1 9.2 10.5 9.5  NEUTROABS  --   --   --  6.8 6.7  HGB 9.1* 9.6* 7.9* 7.6* 9.4*  HCT 28.1* 29.3* 24.7* 23.7* 29.3*  MCV 95.6 93.6 94.3 93.3 89.1  PLT 247 242 355 305 335   Basic Metabolic Panel: Recent Labs  Lab 03/03/23 0135 03/06/23 0630 03/07/23 0251 03/08/23 0523  NA 137 135 132* 129*  K 5.0 4.7 4.5 5.3*  CL 93* 92* 90* 88*  CO2 25 24 26 24   GLUCOSE 85 83 103* 82  BUN 47* 53* 36* 58*  CREATININE 7.36* 7.31* 4.73* 6.81*  CALCIUM 10.0 9.9 9.8 10.1  MG  --   --  1.7 1.8  PHOS 6.1* 7.0* 4.3 7.5*   GFR: Estimated Creatinine Clearance: 5.6 mL/min (A) (by C-G formula based on SCr of 6.81 mg/dL (H)). Liver Function Tests: Recent Labs  Lab 03/03/23 0135 03/06/23 0630 03/07/23 0251 03/08/23 0523  AST  --   --  21 20  ALT  --   --  6 <5  ALKPHOS  --   --  50 62  BILITOT  --   --  0.7 0.6  PROT  --   --   6.4* 6.6  ALBUMIN 2.4* 2.4* 2.3* 2.5*   No results for input(s): "LIPASE", "AMYLASE" in the last 168 hours. No results for input(s): "AMMONIA" in the last 168 hours. Coagulation Profile: No results for input(s): "INR", "PROTIME" in the last 168 hours. Cardiac Enzymes: No results for input(s): "CKTOTAL", "CKMB", "CKMBINDEX", "TROPONINI" in the last 168 hours. BNP (last 3 results) No results for input(s): "PROBNP" in the last 8760 hours. HbA1C: No results for input(s): "  HGBA1C" in the last 72 hours. CBG: No results for input(s): "GLUCAP" in the last 168 hours. Lipid Profile: No results for input(s): "CHOL", "HDL", "LDLCALC", "TRIG", "CHOLHDL", "LDLDIRECT" in the last 72 hours. Thyroid Function Tests: No results for input(s): "TSH", "T4TOTAL", "FREET4", "T3FREE", "THYROIDAB" in the last 72 hours. Anemia Panel: No results for input(s): "VITAMINB12", "FOLATE", "FERRITIN", "TIBC", "IRON", "RETICCTPCT" in the last 72 hours. Sepsis Labs: No results for input(s): "PROCALCITON", "LATICACIDVEN" in the last 168 hours.  Recent Results (from the past 240 hour(s))  Surgical pcr screen     Status: None   Collection Time: 02/28/23  4:51 AM   Specimen: Nasal Mucosa; Nasal Swab  Result Value Ref Range Status   MRSA, PCR NEGATIVE NEGATIVE Final   Staphylococcus aureus NEGATIVE NEGATIVE Final    Comment: (NOTE) The Xpert SA Assay (FDA approved for NASAL specimens in patients 28 years of age and older), is one component of a comprehensive surveillance program. It is not intended to diagnose infection nor to guide or monitor treatment. Performed at [facility], [address], [place], [place] [address]     Radiology Studies: DG Pelvis Portable  Result Date: 03/07/2023 CLINICAL DATA:  Pain.  Left hip arthroplasty 02/28/2023. EXAM: PORTABLE PELVIS 1-2 VIEWS COMPARISON:  Radiograph 02/28/2023 FINDINGS: Left hip arthroplasty in expected alignment. No periprosthetic lucency or fracture.  Decreasing soft tissue edema in the subcutaneous tissues. Bony pelvis is intact. Right hip is normally located. IMPRESSION: No acute findings.  Left hip arthroplasty without complication. Electronically Signed   By: Narda Rutherford M.D.   On: 03/07/2023 16:08    Scheduled Meds:  sodium chloride   Intravenous Once   acetaminophen  650 mg Oral TID   benztropine  0.5 mg Oral BID   Chlorhexidine Gluconate Cloth  6 each Topical Q0600   darbepoetin (ARANESP) injection - NON-DIALYSIS  60 mcg Subcutaneous Q Mon-1800   docusate sodium  100 mg Oral BID   feeding supplement  237 mL Oral BID BM   heparin  5,000 Units Subcutaneous Q8H   metoprolol tartrate  12.5 mg Oral BID   mirtazapine  15 mg Oral QHS   multivitamin  1 tablet Oral QHS   mouth rinse  15 mL Mouth Rinse 4 times per day   pantoprazole  40 mg Oral Daily   senna  1 tablet Oral BID   sevelamer carbonate  1,600 mg Oral TID WC   valproic acid  250 mg Oral TID WC   Continuous Infusions:   LOS: 10 days   Marguerita Merles, DO [facility] Available via Epic secure chat 7am-7pm After these hours, please refer to coverage provider listed on [email or web address] 03/08/2023, 5:16 PM

## 2023-03-08 NOTE — Progress Notes (Signed)
PT Cancellation Note  Patient Details Name: Madison [name] MRN: [id number] DOB: 01-Oct-1950   Cancelled Treatment:    Reason Eval/Treat Not Completed: (P) Patient at procedure or test/unavailable (pt off unit at HD dept.) Will continue efforts per PT plan of care as schedule permits.   Dorathy Kinsman Pete Merten 03/08/2023, 10:58 AM

## 2023-03-08 NOTE — Progress Notes (Addendum)
Update provided by nephrologist that pt was able to tolerate HD in the chair today with no issues. Update provided to [facility] staff as well. Pt for likely d/c today back to snf. Contacted [facility] and spoke to clinic Production designer, theatre/television/film. Staff advised that pt was able to tolerate HD in the chair today with no issues. Clinic advised pt will likely d/c today back to snf and should resume care on Monday. Requested that CSW please advise snf to please send a hoyer pad/sling under pt to HD so pt can be tx from w/c to HD chair. Will fax d/c summary and last renal to clinic once completed for continuation of care.   Olivia Canter Renal Navigator [phone]  Addendum at 3:29 pm: Contacted [facility] to advise staff that pt will not d/c today as planned but may possibly d/c this weekend. Clinic aware pt may resume on Monday if d/c this weekend and that navigator will send d/c summary on Monday morning upon return to the office.

## 2023-03-08 NOTE — Progress Notes (Signed)
Nutrition Follow-up  DOCUMENTATION CODES:   Non-severe (moderate) malnutrition in context of chronic illness, Underweight  INTERVENTION:  Encourage adequate PO intake; feeding with assistance Continue regular diet as ordered Discontinue Nepro shakes Ensure Enlive po BID, each supplement provides 350 kcal and 20 grams of protein. Magic cup TID with meals, each supplement provides 290 kcal and 9 grams of protein Renal MVI with minerals daily Recommend consideration of alternate nutrition access if pt remains inpatient and full code to enhance nutritional adequacy  NUTRITION DIAGNOSIS:   Moderate Malnutrition related to chronic illness (ESRD, schizoaffective disorder) as evidenced by moderate fat depletion, severe fat depletion, moderate muscle depletion. - remains applicable  GOAL:   Patient will meet greater than or equal to 90% of their needs - goal unmet, addressing via meals and nutrition supplements  MONITOR:   PO intake, Supplement acceptance, Diet advancement, Labs, Weight trends, I & O's, Skin  REASON FOR ASSESSMENT:   Consult Hip fracture protocol  ASSESSMENT:   Pt admitted from SNF after multiple falls leading to L femoral fracture. PMH significant for ESRD on HD, HTN and schizoaffective disorder, bipolar type.  Pt off unit for HD. Discharge pending ability to tolerate HD in chair.   Pt intake noted to remain minimal. Per RN, has not received Nepro shakes d/t unavailable on unit.   Meal completions: 6/9: 25% breakfast, 75% lunch 6/10: 25% breakfast 6/11: 0% breakfast 6/12: 10% lunch, 50% dinner 6/13: 25% breakfast  Admit weight: 49.4 kg Post HD weight 6/10: 48.3 kg  Medications: colace, rena-vit, protonix, senna, renvela  Labs: sodium 129, potassium 5.3, BUN 58, Cr 6.81, anion gap 17, phos 7.5, GFR 6  Diet Order:   Diet Order             Diet regular Room service appropriate? Yes; Fluid consistency: Thin; Fluid restriction: 1200 mL Fluid  Diet  effective now                   EDUCATION NEEDS:   Education needs have been addressed  Skin:  Skin Assessment: Reviewed RN Assessment  Last BM:  6/9 (type 5 small)  Height:   Ht Readings from Last 1 Encounters:  02/28/23 5\' 4"  (1.626 m)    Weight:   Wt Readings from Last 1 Encounters:  03/05/23 48.3 kg    Ideal Body Weight:  54.5 kg  BMI:  Body mass index is 18.28 kg/m.  Estimated Nutritional Needs:   Kcal:  1500-1700  Protein:  75-85g  Fluid:  1L + UOP  Drusilla Kanner, RDN, LDN Clinical Nutrition

## 2023-03-08 NOTE — Progress Notes (Signed)
Patient agreeable to T&S drawn, sister Meriam Sprague) contacted to obtain verbal phone consent for blood administration.  Blood transfusing at this time, patient tolerating well, no difficulties observed up to this time.

## 2023-03-08 NOTE — Progress Notes (Addendum)
Physical Therapy Treatment Patient Details Name: Madison [name] MRN: [id number] DOB: 1951-06-16 Today's Date: 03/08/2023   History of Present Illness 72 y.o. female admitted 6/4 for fall and hip pain, s/p UNIPOLAR HEMIARTHROPLASTY OF THE LEFT HIP on 6/6. PMHx: history of ESRD, HTN, and schizophrenia versus bipolar disorder.    PT Comments    Pt received in HD recliner, lethargic so RN gave clearance for session, pt needing +2 maxA to totalA for sit<>stand and pivotal transfer from recliner chair>EOB with RW. Pt making progress toward goals, per RN report this was second stand pivot transfer this date. Pt very fatigued appearing and mumbling/difficult to understand her speech, but recently returned from HD session and had sat up in chair >4 hours. Reviewed posterior hip precs and BLE ROM, however pt confused/lethargic so she will need reinforcement. PA/RN notified pt may benefit from hip abduction pillow when resting as she is noncompliant with posterior hip precs in supine due to cognitive deficit. Pt continues to benefit from PT services to progress toward functional mobility goals.    Recommendations for follow up therapy are one component of a multi-disciplinary discharge planning process, led by the attending physician.  Recommendations may be updated based on patient status, additional functional criteria and insurance authorization.  Follow Up Recommendations  Can patient physically be transported by private vehicle: No    Assistance Recommended at Discharge Frequent or constant Supervision/Assistance  Patient can return home with the following Two people to help with walking and/or transfers;Two people to help with bathing/dressing/bathroom;Assistance with cooking/housework;Assist for transportation;Help with stairs or ramp for entrance   Equipment Recommendations  None recommended by PT    Recommendations for Other Services       Precautions / Restrictions Precautions Precautions:  Posterior Hip (Left) Precaution Booklet Issued: Yes (comment) Precaution Comments: Reviewed with pt, pt confused with need reinforcement Knee Immobilizer - Left: Other (comment) (PTA requested that ortho PA order her a hip abduction pillow if indicated given her noncompliance with posterior hip precs) Restrictions Weight Bearing Restrictions: Yes LLE Weight Bearing: Weight bearing as tolerated     Mobility  Bed Mobility Overal bed mobility: Needs Assistance Bed Mobility: Sit to Supine       Sit to supine: Total assist, +2 for physical assistance   General bed mobility comments: from R EOB, RN assist with trunk/shoulders and PTA assist with BLE to ensure pt compliance with posterior hip precs; pt lethargic/confused and unable to assist with return to supine    Transfers Overall transfer level: Needs assistance Equipment used: Rolling walker (2 wheels) Transfers: Sit to/from Stand, Bed to chair/wheelchair/BSC Sit to Stand: Max assist, +2 physical assistance   Step pivot transfers: Max assist, Total assist, +2 physical assistance       General transfer comment: from HD chair>RW with maxA, however with attempt to step toward her L side and back to EOB, pt with increased posterior lean after first 2 steps and poor RW management, pt ultimately needing +2 totalA for stand>sit and to guide hips last couple steps back to EOB.    Ambulation/Gait               General Gait Details: pt unable to safely attempt due to instability/lethargy with step pivot transfer.   Stairs             Wheelchair Mobility    Modified Rankin (Stroke Patients Only)       Balance Overall balance assessment: Needs assistance Sitting-balance support: Single extremity supported,  Feet supported Sitting balance-Leahy Scale: Poor Sitting balance - Comments: external support +1 for upright sitting in chair with BUE support Postural control: Posterior lean Standing balance support: Bilateral  upper extremity supported, Reliant on assistive device for balance Standing balance-Leahy Scale: Zero Standing balance comment: +2 maxA progressing to totalA +2 for static/dynamic standing tasks                            Cognition Arousal/Alertness: Lethargic Behavior During Therapy: Anxious, Flat affect Overall Cognitive Status: History of cognitive impairments - at baseline                                 General Comments: Pt more lethargic this date upon PTA arrival to room and unable to state her birthday or clearly state her name, pt more alert and tearful/moaning with encouragement to stand but returning to lethargy once back in supine.        Exercises Other Exercises Other Exercises: supine BLE AAROM: heel slides, ankle pumps x10 reps ea Other Exercises: seated LLE AAROM: LAQ x5 reps (nearly PROM)    General Comments General comments (skin integrity, edema, etc.): HR 124-132 bpm with pivotal transfer chair>bed. SpO2 95% on RA.      Pertinent Vitals/Pain Pain Assessment Pain Assessment: PAINAD Breathing: normal Negative Vocalization: repeated troubled calling out, loud moaning/groaning, crying Facial Expression: facial grimacing Body Language: tense, distressed pacing, fidgeting Consolability: distracted or reassured by voice/touch PAINAD Score: 6 Pain Location: L hip with movements (pt did not localize but guarding in this area) Pain Descriptors / Indicators: Grimacing, Guarding, Moaning, Crying Pain Intervention(s): Limited activity within patient's tolerance, Monitored during session, Repositioned, Ice applied, Premedicated before session    Home Living                          Prior Function            PT Goals (current goals can now be found in the care plan section) Acute Rehab PT Goals Patient Stated Goal: none stated PT Goal Formulation: With family Time For Goal Achievement: 03/15/23 Progress towards PT goals:  Progressing toward goals    Frequency    Min 2X/week      PT Plan Current plan remains appropriate       AM-PAC PT "6 Clicks" Mobility   Outcome Measure  Help needed turning from your back to your side while in a flat bed without using bedrails?: Total Help needed moving from lying on your back to sitting on the side of a flat bed without using bedrails?: Total Help needed moving to and from a bed to a chair (including a wheelchair)?: Total Help needed standing up from a chair using your arms (e.g., wheelchair or bedside chair)?: Total Help needed to walk in hospital room?: Total Help needed climbing 3-5 steps with a railing? : Total 6 Click Score: 6    End of Session Equipment Utilized During Treatment: Gait belt Activity Tolerance: Patient limited by lethargy;Patient limited by pain Patient left: in bed;with call bell/phone within reach;with bed alarm set;with SCD's reapplied;Other (comment) (pillows to prevent hip ADduction) Nurse Communication: Mobility status;Need for lift equipment;Patient requests pain meds;Precautions PT Visit Diagnosis: Muscle weakness (generalized) (M62.81);History of falling (Z91.81);Difficulty in walking, not elsewhere classified (R26.2);Pain Pain - Right/Left: Left Pain - part of body: Hip  Time: 4098-1191 PT Time Calculation (min) (ACUTE ONLY): 24 min  Charges:  $Therapeutic Exercise: 8-22 mins $Therapeutic Activity: 8-22 mins                     Ademola Vert P., PTA Acute Rehabilitation Services Secure Chat Preferred 9a-5:30pm Office: [phone]    Dorathy Kinsman [facility] 03/08/2023, 3:42 PM

## 2023-03-08 NOTE — Progress Notes (Signed)
Received patient on HD recliner to unit.  Alert and oriented to person.  Informed consent signed and in chart.   TX duration: 3.5hrs  Patient tolerated well.  Alert, without acute distress.  Hand-off given to patient's nurse.   Access used: L AVF Access issues: None  Total UF removed: Medication(s) given: None   03/08/23 1230  Vitals  Temp 97.7 F (36.5 C)  Temp Source Oral  BP (!) 111/96  MAP (mmHg) 101  BP Location Right Leg  BP Method Automatic  Patient Position (if appropriate) Lying  Pulse Rate (!) 112  Pulse Rate Source Monitor  ECG Heart Rate (!) 125  Resp (!) 22  Oxygen Therapy  SpO2 97 %  O2 Device Room Air  During Treatment Monitoring  Intra-Hemodialysis Comments Tx completed;Tolerated well  Post Treatment  Dialyzer Clearance Heavily streaked  Duration of HD Treatment -hour(s) 3.5 hour(s)  Liters Processed 82.4  Fluid Removed (mL) 1900 mL  Tolerated HD Treatment Yes  Post-Hemodialysis Comments 7  AVG/AVF Arterial Site Held (minutes) 7 minutes     [facility]

## 2023-03-09 DIAGNOSIS — E440 Moderate protein-calorie malnutrition: Secondary | ICD-10-CM | POA: Diagnosis not present

## 2023-03-09 DIAGNOSIS — N186 End stage renal disease: Secondary | ICD-10-CM | POA: Diagnosis not present

## 2023-03-09 DIAGNOSIS — F250 Schizoaffective disorder, bipolar type: Secondary | ICD-10-CM | POA: Diagnosis not present

## 2023-03-09 DIAGNOSIS — S72002D Fracture of unspecified part of neck of left femur, subsequent encounter for closed fracture with routine healing: Secondary | ICD-10-CM | POA: Diagnosis not present

## 2023-03-09 LAB — COMPREHENSIVE METABOLIC PANEL
ALT: 6 U/L (ref 0–44)
AST: 21 U/L (ref 15–41)
Albumin: 2.4 g/dL — ABNORMAL LOW (ref 3.5–5.0)
Alkaline Phosphatase: 65 U/L (ref 38–126)
Anion gap: 16 — ABNORMAL HIGH (ref 5–15)
BUN: 34 mg/dL — ABNORMAL HIGH (ref 8–23)
CO2: 27 mmol/L (ref 22–32)
Calcium: 10 mg/dL (ref 8.9–10.3)
Chloride: 92 mmol/L — ABNORMAL LOW (ref 98–111)
Creatinine, Ser: 4.34 mg/dL — ABNORMAL HIGH (ref 0.44–1.00)
GFR, Estimated: 10 mL/min — ABNORMAL LOW (ref >60)
Glucose, Bld: 105 mg/dL — ABNORMAL HIGH (ref 70–99)
Potassium: 4.3 mmol/L (ref 3.5–5.1)
Sodium: 135 mmol/L (ref 135–145)
Total Bilirubin: 0.6 mg/dL (ref 0.3–1.2)
Total Protein: 6.6 g/dL (ref 6.5–8.1)

## 2023-03-09 LAB — CBC WITH DIFFERENTIAL/PLATELET
Abs Immature Granulocytes: 0.07 10*3/uL (ref 0.00–0.07)
Abs Immature Granulocytes: 0.07 10*3/uL (ref 0.00–0.07)
Basophils Absolute: 0.1 10*3/uL (ref 0.0–0.1)
Basophils Absolute: 0.1 10*3/uL (ref 0.0–0.1)
Basophils Relative: 1 %
Basophils Relative: 1 %
Eosinophils Absolute: 0.1 10*3/uL (ref 0.0–0.5)
Eosinophils Absolute: 0.1 10*3/uL (ref 0.0–0.5)
Eosinophils Relative: 1 %
Eosinophils Relative: 1 %
HCT: 28.3 % — ABNORMAL LOW (ref 36.0–46.0)
HCT: 29.6 % — ABNORMAL LOW (ref 36.0–46.0)
Hemoglobin: 9.3 g/dL — ABNORMAL LOW (ref 12.0–15.0)
Hemoglobin: 9.6 g/dL — ABNORMAL LOW (ref 12.0–15.0)
Immature Granulocytes: 1 %
Immature Granulocytes: 1 %
Lymphocytes Relative: 11 %
Lymphocytes Relative: 11 %
Lymphs Abs: 1.3 10*3/uL (ref 0.7–4.0)
Lymphs Abs: 1.3 10*3/uL (ref 0.7–4.0)
MCH: 29.6 pg (ref 26.0–34.0)
MCH: 29.4 pg (ref 26.0–34.0)
MCHC: 32.9 g/dL (ref 30.0–36.0)
MCHC: 32.4 g/dL (ref 30.0–36.0)
MCV: 90.1 fL (ref 80.0–100.0)
MCV: 90.8 fL (ref 80.0–100.0)
Monocytes Absolute: 1.7 10*3/uL — ABNORMAL HIGH (ref 0.1–1.0)
Monocytes Absolute: 1.4 10*3/uL — ABNORMAL HIGH (ref 0.1–1.0)
Monocytes Relative: 13 %
Monocytes Relative: 12 %
Neutro Abs: 9.1 10*3/uL — ABNORMAL HIGH (ref 1.7–7.7)
Neutro Abs: 8.9 10*3/uL — ABNORMAL HIGH (ref 1.7–7.7)
Neutrophils Relative %: 73 %
Neutrophils Relative %: 74 %
Platelets: 276 10*3/uL (ref 150–400)
Platelets: 289 10*3/uL (ref 150–400)
RBC: 3.14 MIL/uL — ABNORMAL LOW (ref 3.87–5.11)
RBC: 3.26 MIL/uL — ABNORMAL LOW (ref 3.87–5.11)
RDW: 19.6 % — ABNORMAL HIGH (ref 11.5–15.5)
RDW: 19.7 % — ABNORMAL HIGH (ref 11.5–15.5)
WBC: 12.3 10*3/uL — ABNORMAL HIGH (ref 4.0–10.5)
WBC: 11.8 10*3/uL — ABNORMAL HIGH (ref 4.0–10.5)
nRBC: 0 % (ref 0.0–0.2)
nRBC: 0 % (ref 0.0–0.2)

## 2023-03-09 LAB — BPAM RBC: Unit Type and Rh: 7300

## 2023-03-09 LAB — TYPE AND SCREEN
ABO/RH(D): B POS
Unit division: 0

## 2023-03-09 LAB — PHOSPHORUS: Phosphorus: 4.7 mg/dL — ABNORMAL HIGH (ref 2.5–4.6)

## 2023-03-09 LAB — MAGNESIUM: Magnesium: 1.8 mg/dL (ref 1.7–2.4)

## 2023-03-09 MED ORDER — PANTOPRAZOLE SODIUM 40 MG IV SOLR
40.0000 mg | Freq: Two times a day (BID) | INTRAVENOUS | Status: DC
  Administered 2023-03-09 – 2023-03-23 (×26): 40 mg via INTRAVENOUS
  Filled 2023-03-09 (×27): qty 10

## 2023-03-09 NOTE — Progress Notes (Signed)
PROGRESS NOTE    Madison [name]  LKG:[id number] DOB: 01/02/1951 DOA: 02/26/2023 PCP: Charlynne Pander, MD   Brief Narrative:  The patient is a 72 year old SNF resident with past medical history significant for but limited to ESRD on hemodialysis Monday Wednesday Friday, hypertension, history of schizophrenia versus bipolar disorder as well as other comorbidities who presented to the [facility] ER after a fall which occurred multiple days prior.  X-rays at [facility] ER revealed a left femoral fracture and she was transferred to [facility] given that she needed a combination of the orthopedist as well as inpatient dialysis.  She underwent unipolar Hemi arthroplasty of the left hip on 02/28/2023 and has not been deemed stable from a orthopedic standpoint.  She had issues with her dialysis catheter which has not been fixed and she is getting dialysis.  She was extremely agitated and hysterically crying yesterday and remains emotionally labile and tearful however was calmer.  We were anticipating discharging back to SNF in next 24 to 48 hours with resumption of her normal dialysis session on Friday, 03/08/2023 however a barrier to discharge was that the patient will need to be able to tolerate sitting in a chair for 4 to 5 hours to be appropriate for outpatient HD for discharge.  She was able to dialyze in the chair today appropriately and had no issues with that but heart rate remained elevated in the setting of pain and agitation so she was initiated on a beta-blocker and given some pain medication.   Heart rate was improved however she now has a slight leukocytosis and after repeat is trending down.  TOC attempted to call the facility and got an response so unfortunately cannot discharge today given that the facility did not pick up the phone.  Assessment and Plan:  Left Femoral Neck Fracture -Further Care per Orthopedic Surgery - s/p unipolar hemiarthroplasty of the left hip 02/28/2023  -Appears to be stable from  an orthopedic standpoint they are recommending weightbearing as tolerated on the left lower extremity with assistance and posterior hip precautions for 12 weeks with daily wound care as needed and VTE prophylaxis with subcu heparin for 28 days -C/w with pain control and she is on oxycodone 5 to 10 mg every 4 as needed for moderate pain and acetaminophen 660 mg p.o. 3 times daily, and on Fentanyl 12.5 mcg every 2 hours as needed for severe pain -Continue with bowel regimen with Senokot 8.6 mg p.o. twice daily, docusate 100 mL p.o. twice daily  -DG Knee done and showed "No evidence of acute osseous abnormality. Stable degenerative change." -Given hip rotation orthopedic surgery is obtaining an x-ray and it showed "Left hip arthroplasty in expected alignment. No periprosthetic lucency or fracture. Decreasing soft tissue edema in the subcutaneous tissues. Bony pelvis is intact. Right hip is normally located." -Orthopedic surgery recommending following up in 10 days after discharge              Schizoaffective Disorder, Bipolar Type with uncontrollable crying and agitation Intermittently  -Continue usual home medical regimen and continue with aripiprazole 50 mg p.o. hemodialysis center history, benztropine 0.5 mg p.o. twice daily and continue Valproic Acid 250 mg po TID -Patient was hysterically crying and extremely agitated and anxious today -Continue Mirtazapine 15 mg p.o. nightly -Utilize as needed Haldol for agitation and resumed yesterday   ESRD on HD MWF Hyperphosphatemia, improved with Dialysis  Elevated Anion Gap -Ongoing issues with dialysis cannulation now corrected status post PTA to the left  brachial vein outflow stenosis to 14 mm by the interventional radiology team Dr. Loreta Ave on 03/04/2023 -BUN/Cr Trend: Recent Labs  Lab 02/28/23 1639 03/01/23 0809 03/03/23 0135 03/06/23 0630 03/07/23 0251 03/08/23 0523 03/09/23 0158  BUN 46* 61* 47* 53* 36* 58* 34*  CREATININE 7.79* 9.11* 7.36*  7.31* 4.73* 6.81* 4.34*  -Phos Level Trend: Recent Labs  Lab 02/28/23 1639 03/01/23 0809 03/03/23 0135 03/06/23 0630 03/07/23 0251 03/08/23 0523 03/09/23 0158  PHOS 7.5* 7.8* 6.1* 7.0* 4.3 7.5* 4.7*  -Patient has an elevated anion gap of 16, CO2 27, chloride level 92 -Further Care per Nephrology and she needs to dialyze in the chair prior to being discharged and was dialyzed in the bed so we will attempt to set her up in the chair tomorrow for her to resume her outpatient dialysis Severe -C/w Sevelamer Carbonate 1600 mg po TID -Avoid Nephrotoxic Medications, Contrast Dyes, Hypotension and Dehydration to Ensure Adequate Renal Perfusion and will need to Renally Adjust Meds -Continue to Monitor and Trend Renal Function carefully and repeat CMP in the AM   Sinus Tachycardia, improving  -Likely Do to Pain and Agiation -C/w Supportive Measures -Give BB with Metoprolol Tartrate IV 5 mg x1 and then start po Metoprolol Tartrate 12.5 mg po BID -Continue to Monitor on Telemetry and repeat EKG in the AM  Leukocytosis -Mild and likely Reactive and ? Due to Pain -WBC Trend: Recent Labs  Lab 03/02/23 0109 03/03/23 0135 03/06/23 0630 03/07/23 0251 03/08/23 0523 03/09/23 0158 03/09/23 0836  WBC 8.9 7.1 9.2 10.5 9.5 12.3* 11.8*  -Continue to Monitor for S/Sx of Infection -Repeat CBC in the AM  Hyponatremia -Na+ Trend: Recent Labs  Lab 02/28/23 1639 03/01/23 0809 03/03/23 0135 03/06/23 0630 03/07/23 0251 03/08/23 0523 03/09/23 0158  NA 133* 134* 137 135 132* 129* 135  -Continue to Monitor and Trend and Repeat CMP within 1 week  Anemia of Chronic Kidney Disease compounded by Postoperative Anemia Hemoglobin has stabilized at approximately 9 the last few days but dropped a little today -Receives Darbepoetin Alfa 60 mcg sq qMon -Hgb/Hct Trend: Recent Labs  Lab 03/02/23 0109 03/03/23 0135 03/06/23 0630 03/07/23 0251 03/08/23 0523 03/09/23 0158 03/09/23 0836  HGB 9.1* 9.6*  7.9* 7.6* 9.4* 9.3* 9.6*  HCT 28.1* 29.3* 24.7* 23.7* 29.3* 28.3* 29.6*  MCV 95.6 93.6 94.3 93.3 89.1 90.1 90.8  -Orthopedic surgery is typed and screened and transfused 1 unit of PRBCs given her drop in hemoglobin hematocrit and tachycardia on 03/07/23 -Continue to monitor for signs and symptoms of bleeding; no overt bleeding noted -Repeat CBC in the AM   Malnutrition of Moderate Degree  -Related to chronic illness (ESRD, schizoaffective disorder) as evidenced by moderate fat depletion, severe fat depletion, moderate muscle depletion -Nutrition Status: Nutrition Problem: Moderate Malnutrition Etiology: chronic illness (ESRD, schizoaffective disorder) Signs/Symptoms: moderate fat depletion, severe fat depletion, moderate muscle depletion Interventions: Ensure Enlive (each supplement provides 350kcal and 20 grams of protein)  GERD/GI Prophylaxis -C/w Pantoprazole 40 mg po Daily  Hypoalbuminemia -Patient's Albumin Trend: Recent Labs  Lab 02/28/23 1639 03/01/23 0809 03/03/23 0135 03/06/23 0630 03/07/23 0251 03/08/23 0523 03/09/23 0158  ALBUMIN 3.0* 2.7* 2.4* 2.4* 2.3* 2.5* 2.4*  -Continue to Monitor and Trend and repeat CMP in the AM   DVT prophylaxis: heparin injection 5,000 Units Start: 02/28/23 2200 SCDs Start: 02/28/23 1334    Code Status: Full Code Family Communication: No family at bedside  Disposition Plan:  Level of care: Med-Surg Status is: Inpatient Remains inpatient appropriate  because: Ready for SNF however unfortunately cannot go today given that the SNF would not pick up the phone to accept the patient back   Consultants:  Nephrology Orthopedic Surgery Interventional Radiology  Procedures:  As delineated as above  Antimicrobials:  Anti-infectives (From admission, onward)    Start     Dose/Rate Route Frequency Ordered Stop   02/28/23 1430  ceFAZolin (ANCEF) IVPB 2g/100 mL premix  Status:  Discontinued        2 g 200 mL/hr over 30 Minutes Intravenous  Every 6 hours 02/28/23 1334 02/28/23 1344   02/28/23 1147  vancomycin (VANCOCIN) powder  Status:  Discontinued          As needed 02/28/23 1147 02/28/23 1216   02/28/23 0830  ceFAZolin (ANCEF) IVPB 2g/100 mL premix        2 g 200 mL/hr over 30 Minutes Intravenous On call to O.R. 02/28/23 0731 02/28/23 1033   02/28/23 0830  vancomycin (VANCOCIN) IVPB 1000 mg/200 mL premix        1,000 mg 200 mL/hr over 60 Minutes Intravenous On call to O.R. 02/28/23 1610 02/28/23 0858       Subjective: Seen and examined at bedside and she was sitting up in the bed crying and very tearful again.  Unable to elicit if she is having any pain.  No nausea vomiting.  WBC trended upwards however now trending back down.  Appears stable to be discharged to SNF however SNF facility could not accept her back given that they did not pick up the phone.  Objective: Vitals:   03/08/23 2029 03/09/23 0025 03/09/23 0430 03/09/23 0826  BP: (!) 142/113 113/65 108/61 100/78  Pulse: (!) 105 (!) 108 98   Resp: 19 16 14 16   Temp: 98.7 F (37.1 C) 98.7 F (37.1 C) 98.4 F (36.9 C) 98 F (36.7 C)  TempSrc:  Oral Axillary Axillary  SpO2: 97% 100% 100% 98%  Weight:      Height:        Intake/Output Summary (Last 24 hours) at 03/09/2023 1654 Last data filed at 03/09/2023 1300 Gross per 24 hour  Intake 360 ml  Output --  Net 360 ml   Filed Weights   03/04/23 1213 03/04/23 1726 03/05/23 0426  Weight: 49.4 kg 48.3 kg 48.3 kg   Examination: Physical Exam:  Constitutional: Thin cachectic chronically ill-appearing AAF who is very tearful and upset Respiratory: Diminished to auscultation bilaterally, no wheezing, rales, rhonchi or crackles. Normal respiratory effort and patient is not tachypenic. No accessory muscle use. Unlabored breathing  Cardiovascular: RRR, no murmurs / rubs / gallops. S1 and S2 auscultated.  Abdomen: Soft, non-tender, non-distended. Bowel sounds positive.  GU: Deferred. Musculoskeletal: No clubbing  / cyanosis of digits/nails. No joint deformity upper and lower extremities.  Skin: No rashes, lesions, ulcers on limited skin evaluation. No induration; Warm and dry.  Neurologic: CN 2-12 grossly intact with no focal deficits. Romberg sign and cerebellar reflexes not assessed.  Psychiatric: Impaired judgment and insight and appears extremely tearful and anxious  Data Reviewed: I have personally reviewed following labs and imaging studies  CBC: Recent Labs  Lab 03/06/23 0630 03/07/23 0251 03/08/23 0523 03/09/23 0158 03/09/23 0836  WBC 9.2 10.5 9.5 12.3* 11.8*  NEUTROABS  --  6.8 6.7 9.1* 8.9*  HGB 7.9* 7.6* 9.4* 9.3* 9.6*  HCT 24.7* 23.7* 29.3* 28.3* 29.6*  MCV 94.3 93.3 89.1 90.1 90.8  PLT 355 305 335 276 289   Basic Metabolic Panel: Recent Labs  Lab 03/03/23 0135 03/06/23 0630 03/07/23 0251 03/08/23 0523 03/09/23 0158  NA 137 135 132* 129* 135  K 5.0 4.7 4.5 5.3* 4.3  CL 93* 92* 90* 88* 92*  CO2 25 24 26 24 27   GLUCOSE 85 83 103* 82 105*  BUN 47* 53* 36* 58* 34*  CREATININE 7.36* 7.31* 4.73* 6.81* 4.34*  CALCIUM 10.0 9.9 9.8 10.1 10.0  MG  --   --  1.7 1.8 1.8  PHOS 6.1* 7.0* 4.3 7.5* 4.7*   GFR: Estimated Creatinine Clearance: 8.8 mL/min (A) (by C-G formula based on SCr of 4.34 mg/dL (H)). Liver Function Tests: Recent Labs  Lab 03/03/23 0135 03/06/23 0630 03/07/23 0251 03/08/23 0523 03/09/23 0158  AST  --   --  21 20 21   ALT  --   --  6 <5 6  ALKPHOS  --   --  50 62 65  BILITOT  --   --  0.7 0.6 0.6  PROT  --   --  6.4* 6.6 6.6  ALBUMIN 2.4* 2.4* 2.3* 2.5* 2.4*   No results for input(s): "LIPASE", "AMYLASE" in the last 168 hours. No results for input(s): "AMMONIA" in the last 168 hours. Coagulation Profile: No results for input(s): "INR", "PROTIME" in the last 168 hours. Cardiac Enzymes: No results for input(s): "CKTOTAL", "CKMB", "CKMBINDEX", "TROPONINI" in the last 168 hours. BNP (last 3 results) No results for input(s): "PROBNP" in the last 8760  hours. HbA1C: No results for input(s): "HGBA1C" in the last 72 hours. CBG: No results for input(s): "GLUCAP" in the last 168 hours. Lipid Profile: No results for input(s): "CHOL", "HDL", "LDLCALC", "TRIG", "CHOLHDL", "LDLDIRECT" in the last 72 hours. Thyroid Function Tests: No results for input(s): "TSH", "T4TOTAL", "FREET4", "T3FREE", "THYROIDAB" in the last 72 hours. Anemia Panel: No results for input(s): "VITAMINB12", "FOLATE", "FERRITIN", "TIBC", "IRON", "RETICCTPCT" in the last 72 hours. Sepsis Labs: No results for input(s): "PROCALCITON", "LATICACIDVEN" in the last 168 hours.  Recent Results (from the past 240 hour(s))  Surgical pcr screen     Status: None   Collection Time: 02/28/23  4:51 AM   Specimen: Nasal Mucosa; Nasal Swab  Result Value Ref Range Status   MRSA, PCR NEGATIVE NEGATIVE Final   Staphylococcus aureus NEGATIVE NEGATIVE Final    Comment: (NOTE) The Xpert SA Assay (FDA approved for NASAL specimens in patients 4 years of age and older), is one component of a comprehensive surveillance program. It is not intended to diagnose infection nor to guide or monitor treatment. Performed at [facility], [address], [place], [place] [address]     Radiology Studies: No results found.  Scheduled Meds:  sodium chloride   Intravenous Once   acetaminophen  650 mg Oral TID   benztropine  0.5 mg Oral BID   Chlorhexidine Gluconate Cloth  6 each Topical Q0600   darbepoetin (ARANESP) injection - NON-DIALYSIS  60 mcg Subcutaneous Q Mon-1800   docusate sodium  100 mg Oral BID   feeding supplement  237 mL Oral BID BM   heparin  5,000 Units Subcutaneous Q8H   metoprolol tartrate  12.5 mg Oral BID   mirtazapine  15 mg Oral QHS   multivitamin  1 tablet Oral QHS   mouth rinse  15 mL Mouth Rinse 4 times per day   pantoprazole  40 mg Oral Daily   senna  1 tablet Oral BID   sevelamer carbonate  1,600 mg Oral TID WC   valproic acid  250 mg  Oral TID WC   Continuous  Infusions:   LOS: 11 days   Marguerita Merles, DO [facility] Available via Epic secure chat 7am-7pm After these hours, please refer to coverage provider listed on [email or web address] 03/09/2023, 4:54 PM

## 2023-03-09 NOTE — TOC Progression Note (Signed)
Transition of Care [facility] - Initial/Assessment Note    Patient Details  Name: Madison [name] MRN: [id number] Date of Birth: November 13, 1950  Transition of Care [facility]) CM/SW Contact:    Ralene Bathe, LCSW Phone Number: 03/09/2023, 2:48 PM  Clinical Narrative:                 LCSW informed that patient is medically ready to discharge.  LCSW contacted Revonda Standard in admissions at the facility. The call went directly to voicemail.  LCSW left secure message informing facility that patient is medically ready.  After not receiving a returned call, LCSW contacted [facility] [place] [facility] directly.  The phone rang multiple times and then disconnected.  There was not a prompt for LCSW to leave a voicemail.  Care team notified.   TOC following.  Expected Discharge Plan: Skilled Nursing Facility Barriers to Discharge: Continued Medical Work up, SNF Pending bed offer   Patient Goals and CMS Choice     Choice offered to / list presented to : [facility] POA / Guardian (sister/legal guardian Meriam Sprague)      Expected Discharge Plan and Services In-house Referral: Clinical Social Work   Post Acute Care Choice: Skilled Nursing Facility Living arrangements for the past 2 months: Skilled Nursing Facility ([facility])                                      Prior Living Arrangements/Services Living arrangements for the past 2 months: Skilled Nursing Facility ([facility]) Lives with:: Facility Resident Patient language and need for interpreter reviewed:: No        Need for Family Participation in Patient Care: Yes (Comment) Care giver support system in place?: Yes (comment) Current home services: Other (comment) (na) Criminal Activity/Legal Involvement Pertinent to Current Situation/Hospitalization: No - Comment as needed  Activities of Daily Living      Permission Sought/Granted                  Emotional Assessment       Orientation: :  Oriented to Self      Admission diagnosis:  Femoral neck fracture (HCC) [S72.009A] Patient Active Problem List   Diagnosis Date Noted   Malnutrition of moderate degree 02/28/2023   Hip fracture, left (HCC) 02/26/2023   Femoral neck fracture (HCC) 02/26/2023   SIRS (systemic inflammatory response syndrome) (HCC) 12/26/2022   Murmur, cardiac 09/20/2022   Encounter for screening fecal occult blood testing 10/31/2021   Encounter for gynecological examination 10/31/2021   Acute metabolic encephalopathy 04/06/2020   Rhabdomyolysis 04/06/2020   Elevated troponin 04/06/2020   Pneumonia 04/06/2020   Lactic acidosis 04/06/2020   Suspected Sepsis (HCC) 04/06/2020   Possible NSTEMI (non-ST elevated myocardial infarction) (HCC) 04/06/2020    Suspected Overdose, undetermined intent, initial encounter 04/06/2020   Schizoaffective disorder, bipolar type (HCC)    Bipolar affective disorder, current episode manic with psychotic symptoms (HCC) 12/25/2018   ESRD on hemodialysis (HCC) 12/25/2018   History of adenomatous polyp of colon 11/20/2018   Loss of weight 10/17/2017   Hypernatremia 04/17/2016   CKD (chronic kidney disease) stage 3, GFR 30-59 ml/min (HCC) 04/16/2016   Normocytic anemia 04/16/2016   Meningioma (HCC) 04/16/2016   Acute psychosis (HCC) 04/15/2016   Hyponatremia 04/15/2016   AKI (acute kidney injury) (HCC) 04/15/2016   Special screening for malignant neoplasms, colon    Thrombocytopenia (HCC)  01/05/2016   Altered mental status 04/19/2015   PCP:  Charlynne Pander, MD Pharmacy:   [facility] - [place], [place] - [address] [place] [address] Phone: [phone] Fax: [phone]  [facility] [address] [place] [address] Phone: [phone] Fax: [phone]  [facility] [place], [place] - [address] [place] [address] Phone: [phone] Fax:  [phone]     Social Determinants of Health (SDOH) Social History: SDOH Screenings   Alcohol Screen: Low Risk  (12/25/2018)  Tobacco Use: Low Risk  (03/04/2023)   SDOH Interventions:     Readmission Risk Interventions    12/27/2022    8:27 AM  Readmission Risk Prevention Plan  Transportation Screening Complete  HRI or Home Care Consult Complete  Social Work Consult for Recovery Care Planning/Counseling Complete  Palliative Care Screening Not Applicable  Medication Review Oceanographer) Complete

## 2023-03-09 NOTE — Progress Notes (Signed)
Patient ID: Madison [name], female   DOB: [birth year], 72 y.o.   MRN: [id number] [facility] Progress Note   Assessment/ Plan:   1.  Status post hemiarthroplasty for left hip fracture: Postoperative day #9 and awaiting outpatient placement to skilled nursing facility along with OP orthopedic surgery follow up. 2. ESRD: On a Monday/Wednesday/Friday schedule with last hemodialysis treatment done yesterday successfully with patient sitting in a recliner- she appears stable for OP HD. 3. Anemia: Hemoglobin and hematocrit are low as expected postoperatively, continue to monitor with ESA. 1 unit PRBC ordered by orthopedic surgery. 4. CKD-MBD: Phosphorus level improving with adjustment of sevelamer.  Calcium level acceptable. 5. Nutrition: Continue renal diet/nutritional supplementation. 6. Hypertension: Blood pressure currently at goal, monitor with hemodialysis/UF.  Subjective:   No acute events overnight and tolerated her entire HD treatment in recliner yesterday   Objective:   BP 100/78 (BP Location: Right Arm)   Pulse 98   Temp 98 F (36.7 C) (Axillary)   Resp 16   Ht 5\' 4"  (1.626 m)   Wt 48.3 kg   SpO2 98%   BMI 18.28 kg/m   Physical Exam: Gen: Comfortably sleeping in bed and awakens to conversation CVS: Pulse regular rhythm, mildly tachycardic, S1 and S2 normal Resp: Clear to auscultation bilaterally, no distinct rales or rhonchi Abd: Soft, flat, nontender, bowel sounds normal Ext: No lower extremity edema, left upper arm fistula with intact dressings  Labs: BMET Recent Labs  Lab 03/03/23 0135 03/06/23 0630 03/07/23 0251 03/08/23 0523 03/09/23 0158  NA 137 135 132* 129* 135  K 5.0 4.7 4.5 5.3* 4.3  CL 93* 92* 90* 88* 92*  CO2 25 24 26 24 27   GLUCOSE 85 83 103* 82 105*  BUN 47* 53* 36* 58* 34*  CREATININE 7.36* 7.31* 4.73* 6.81* 4.34*  CALCIUM 10.0 9.9 9.8 10.1 10.0  PHOS 6.1* 7.0* 4.3 7.5* 4.7*   CBC Recent Labs  Lab 03/07/23 0251 03/08/23 0523  03/09/23 0158 03/09/23 0836  WBC 10.5 9.5 12.3* 11.8*  NEUTROABS 6.8 6.7 9.1* 8.9*  HGB 7.6* 9.4* 9.3* 9.6*  HCT 23.7* 29.3* 28.3* 29.6*  MCV 93.3 89.1 90.1 90.8  PLT 305 335 276 289      Medications:     sodium chloride   Intravenous Once   acetaminophen  650 mg Oral TID   benztropine  0.5 mg Oral BID   Chlorhexidine Gluconate Cloth  6 each Topical Q0600   darbepoetin (ARANESP) injection - NON-DIALYSIS  60 mcg Subcutaneous Q Mon-1800   docusate sodium  100 mg Oral BID   feeding supplement  237 mL Oral BID BM   heparin  5,000 Units Subcutaneous Q8H   metoprolol tartrate  12.5 mg Oral BID   mirtazapine  15 mg Oral QHS   multivitamin  1 tablet Oral QHS   mouth rinse  15 mL Mouth Rinse 4 times per day   pantoprazole  40 mg Oral Daily   senna  1 tablet Oral BID   sevelamer carbonate  1,600 mg Oral TID WC   valproic acid  250 mg Oral TID WC   Zetta Bills, MD 03/09/2023, 9:25 AM

## 2023-03-10 ENCOUNTER — Inpatient Hospital Stay (HOSPITAL_COMMUNITY): Payer: Medicare Other

## 2023-03-10 DIAGNOSIS — F250 Schizoaffective disorder, bipolar type: Secondary | ICD-10-CM | POA: Diagnosis not present

## 2023-03-10 DIAGNOSIS — E440 Moderate protein-calorie malnutrition: Secondary | ICD-10-CM | POA: Diagnosis not present

## 2023-03-10 DIAGNOSIS — D72829 Elevated white blood cell count, unspecified: Secondary | ICD-10-CM

## 2023-03-10 DIAGNOSIS — S72002D Fracture of unspecified part of neck of left femur, subsequent encounter for closed fracture with routine healing: Secondary | ICD-10-CM | POA: Diagnosis not present

## 2023-03-10 DIAGNOSIS — N186 End stage renal disease: Secondary | ICD-10-CM | POA: Diagnosis not present

## 2023-03-10 LAB — CBC WITH DIFFERENTIAL/PLATELET
Abs Immature Granulocytes: 0.09 10*3/uL — ABNORMAL HIGH (ref 0.00–0.07)
Basophils Absolute: 0.1 10*3/uL (ref 0.0–0.1)
Basophils Relative: 0 %
Eosinophils Absolute: 0.2 10*3/uL (ref 0.0–0.5)
Eosinophils Relative: 2 %
HCT: 28.2 % — ABNORMAL LOW (ref 36.0–46.0)
Hemoglobin: 9 g/dL — ABNORMAL LOW (ref 12.0–15.0)
Immature Granulocytes: 1 %
Lymphocytes Relative: 11 %
Lymphs Abs: 1.5 10*3/uL (ref 0.7–4.0)
MCH: 28.6 pg (ref 26.0–34.0)
MCHC: 31.9 g/dL (ref 30.0–36.0)
MCV: 89.5 fL (ref 80.0–100.0)
Monocytes Absolute: 1.2 10*3/uL — ABNORMAL HIGH (ref 0.1–1.0)
Monocytes Relative: 9 %
Neutro Abs: 10.3 10*3/uL — ABNORMAL HIGH (ref 1.7–7.7)
Neutrophils Relative %: 77 %
Platelets: 297 10*3/uL (ref 150–400)
RBC: 3.15 MIL/uL — ABNORMAL LOW (ref 3.87–5.11)
RDW: 19.4 % — ABNORMAL HIGH (ref 11.5–15.5)
WBC: 13.4 10*3/uL — ABNORMAL HIGH (ref 4.0–10.5)
nRBC: 0 % (ref 0.0–0.2)

## 2023-03-10 LAB — MAGNESIUM: Magnesium: 1.8 mg/dL (ref 1.7–2.4)

## 2023-03-10 LAB — COMPREHENSIVE METABOLIC PANEL
ALT: 6 U/L (ref 0–44)
AST: 21 U/L (ref 15–41)
Albumin: 2.3 g/dL — ABNORMAL LOW (ref 3.5–5.0)
Alkaline Phosphatase: 58 U/L (ref 38–126)
Anion gap: 17 — ABNORMAL HIGH (ref 5–15)
BUN: 56 mg/dL — ABNORMAL HIGH (ref 8–23)
CO2: 26 mmol/L (ref 22–32)
Calcium: 10.1 mg/dL (ref 8.9–10.3)
Chloride: 89 mmol/L — ABNORMAL LOW (ref 98–111)
Creatinine, Ser: 6.11 mg/dL — ABNORMAL HIGH (ref 0.44–1.00)
GFR, Estimated: 7 mL/min — ABNORMAL LOW (ref >60)
Glucose, Bld: 103 mg/dL — ABNORMAL HIGH (ref 70–99)
Potassium: 4.9 mmol/L (ref 3.5–5.1)
Sodium: 132 mmol/L — ABNORMAL LOW (ref 135–145)
Total Bilirubin: 0.6 mg/dL (ref 0.3–1.2)
Total Protein: 6.5 g/dL (ref 6.5–8.1)

## 2023-03-10 LAB — PHOSPHORUS: Phosphorus: 5.7 mg/dL — ABNORMAL HIGH (ref 2.5–4.6)

## 2023-03-10 MED ORDER — SODIUM CHLORIDE 0.9 % IV BOLUS
250.0000 mL | Freq: Once | INTRAVENOUS | Status: AC
  Administered 2023-03-10: 250 mL via INTRAVENOUS

## 2023-03-10 NOTE — TOC Progression Note (Addendum)
Transition of Care [facility]) - Progression Note    Patient Details  Name: Madison [name] MRN: [id number] Date of Birth: 04/09/1951  Transition of Care [facility]) CM/SW Contact  Jimmy Picket, [place] Phone Number: 03/10/2023, 11:06 AM  Clinical Narrative:     2:51pm- CSW received call back from [place], patients legal guardian. Meriam Sprague expressed concerns about patient returning to [place], due to the broken hip. CSW encouraged member to speak with social worker at the facility. Meriam Sprague does not want member to [place] without talking to staff. CSW called admissions coordinator to relay concerns. Admissions coordinator shared that its documented that they have made multiple calls to [place]. CSW called Meriam Sprague who states she has not received any phone calls. CSW encouraged Meriam Sprague to call facility tomorrow.   Meriam Sprague requested a new facility, CSW gave [employer] of facilities that accepted her. Beverly requested CSW to call [facility]. [facility] called [facility] and they are able to take patient at their facility. Patients HD will need to be moved to [facility], preferably with a MWF schedule. Csw will inform [facility] navigator. [facility] is able to take patient as early as Advertising account executive.   Meriam Sprague requested the patient experience number, as she is unhappy with SNF bed choices. CSW gave number as requested.   11:00am-CSW spoke to admissions coordinator at [facility]. They are able to take patient today. CSW called patients legal guardian Neily Gabler at [phone]. There was no answer, CSW left voicemail requesting a call back.   Expected Discharge Plan: Skilled Nursing Facility Barriers to Discharge: Continued Medical Work up, SNF Pending bed offer  Expected Discharge Plan and Services In-house Referral: Clinical Social Work   Post Acute Care Choice: Skilled Nursing Facility Living arrangements for the past 2 months: Skilled Nursing Facility ([facility] term [facility])                                        Social Determinants of Health (SDOH) Interventions SDOH Screenings   Alcohol Screen: Low Risk  (12/25/2018)  Tobacco Use: Low Risk  (03/04/2023)    Readmission Risk Interventions    12/27/2022    8:27 AM  Readmission Risk Prevention Plan  Transportation Screening Complete  HRI or Home Care Consult Complete  Social Work Consult for Recovery Care Planning/Counseling Complete  Palliative Care Screening Not Applicable  Medication Review Oceanographer) Complete

## 2023-03-10 NOTE — Progress Notes (Addendum)
Patient ID: Madison [name], female   DOB: June 25, 1951, 72 y.o.   MRN: [id number] [facility] Progress Note   Assessment/ Plan:   1.  Status post hemiarthroplasty for left hip fracture: Postoperative day #10 and awaiting outpatient placement to skilled nursing facility along with OP orthopedic surgery follow up. 2. ESRD: On a Monday/Wednesday/Friday schedule [facility] in [place]) with last hemodialysis treatment on Friday 6/14 tolerated in recliner.  I will order for hemodialysis again tomorrow in a recliner as I anticipate that she will remain here today. 3. Anemia: Hemoglobin and hematocrit are low as expected postoperatively, continue to monitor with ESA. 1 unit PRBC ordered by orthopedic surgery. 4. CKD-MBD: Phosphorus level improving with adjustment of sevelamer.  Calcium level acceptable. 5. Nutrition: Continue renal diet/nutritional supplementation. 6. Hypertension: Blood pressure currently at goal, monitor with hemodialysis/UF. 7.  Leukocytosis: Afebrile and ongoing evaluation for possible infection source with concomitant tachycardia.  Subjective:   No acute events overnight and tolerated her entire HD treatment in recliner yesterday   Objective:   BP 102/61 (BP Location: Right Arm)   Pulse (!) 116   Temp 98.7 F (37.1 C) (Oral)   Resp 15   Ht 5\' 4"  (1.626 m)   Wt 48.3 kg   SpO2 96%   BMI 18.28 kg/m   Physical Exam: Gen: Resting comfortably in bed, minimal verbal responses to conversation CVS: Pulse regular rhythm, mildly tachycardic, S1 and S2 normal Resp: Clear to auscultation bilaterally, no distinct rales or rhonchi Abd: Soft, flat, nontender, bowel sounds normal Ext: No lower extremity edema, left upper arm fistula with intact dressings  Labs: BMET Recent Labs  Lab 03/06/23 0630 03/07/23 0251 03/08/23 0523 03/09/23 0158 03/10/23 0110  NA 135 132* 129* 135 132*  K 4.7 4.5 5.3* 4.3 4.9  CL 92* 90* 88* 92* 89*  CO2 24 26 24 27 26    GLUCOSE 83 103* 82 105* 103*  BUN 53* 36* 58* 34* 56*  CREATININE 7.31* 4.73* 6.81* 4.34* 6.11*  CALCIUM 9.9 9.8 10.1 10.0 10.1  PHOS 7.0* 4.3 7.5* 4.7* 5.7*   CBC Recent Labs  Lab 03/08/23 0523 03/09/23 0158 03/09/23 0836 03/10/23 0110  WBC 9.5 12.3* 11.8* 13.4*  NEUTROABS 6.7 9.1* 8.9* 10.3*  HGB 9.4* 9.3* 9.6* 9.0*  HCT 29.3* 28.3* 29.6* 28.2*  MCV 89.1 90.1 90.8 89.5  PLT 335 276 289 297      Medications:     sodium chloride   Intravenous Once   acetaminophen  650 mg Oral TID   benztropine  0.5 mg Oral BID   Chlorhexidine Gluconate Cloth  6 each Topical Q0600   darbepoetin (ARANESP) injection - NON-DIALYSIS  60 mcg Subcutaneous Q Mon-1800   docusate sodium  100 mg Oral BID   feeding supplement  237 mL Oral BID BM   heparin  5,000 Units Subcutaneous Q8H   metoprolol tartrate  12.5 mg Oral BID   mirtazapine  15 mg Oral QHS   multivitamin  1 tablet Oral QHS   mouth rinse  15 mL Mouth Rinse 4 times per day   pantoprazole (PROTONIX) IV  40 mg Intravenous Q12H   senna  1 tablet Oral BID   sevelamer carbonate  1,600 mg Oral TID WC   valproic acid  250 mg Oral TID WC   Zetta Bills, MD 03/10/2023, 9:31 AM

## 2023-03-10 NOTE — Progress Notes (Signed)
PROGRESS NOTE    Madison [name]  ONG:[id number] DOB: 04-Aug-1951 DOA: 02/26/2023 PCP: Charlynne Pander, MD   Brief Narrative:  The patient is a 73 year old SNF resident with past medical history significant for but limited to ESRD on hemodialysis Monday Wednesday Friday, hypertension, history of schizophrenia versus bipolar disorder as well as other comorbidities who presented to the [facility] ER after a fall which occurred multiple days prior.  X-rays at [facility] revealed a left femoral fracture and she was transferred to [facility] given that she needed a combination of the orthopedist as well as inpatient dialysis.  She underwent unipolar Hemi arthroplasty of the left hip on 02/28/2023 and has not been deemed stable from a orthopedic standpoint.  She had issues with her dialysis catheter which has not been fixed and she is getting dialysis.  She was extremely agitated and hysterically crying yesterday and remains emotionally labile and tearful however was calmer.  We were anticipating discharging back to SNF in next 24 to 48 hours with resumption of her normal dialysis session on Friday, 03/08/2023 however a barrier to discharge was that the patient will need to be able to tolerate sitting in a chair for 4 to 5 hours to be appropriate for outpatient HD for discharge.  She was able to dialyze in the chair today appropriately and had no issues with that but heart rate remained elevated in the setting of pain and agitation so she was initiated on a beta-blocker and given some pain medication.   Heart rate was improved however she now has a slight leukocytosis and after repeat is trending down.  TOC attempted to call the facility and got an response so unfortunately could not discharge yesterday given that no one at the bedside picked up.  Today the case worker is informed that the patient's legal guardian was unhappy with the SNF choice and requested to change her SNF placement to [facility].  [facility]  is able to take the patient at their facility as early as tomorrow however because of the symptoms changed her dialysis center will need to be moved.  Will continue to monitor and follow-up for infectious symptoms.  Assessment and Plan:  Left Femoral Neck Fracture -Further Care per Orthopedic Surgery - s/p unipolar hemiarthroplasty of the left hip 02/28/2023  -Appears to be stable from an orthopedic standpoint they are recommending weightbearing as tolerated on the left lower extremity with assistance and posterior hip precautions for 12 weeks with daily wound care as needed and VTE prophylaxis with subcu heparin for 28 days -C/w with pain control and she is on oxycodone 5 to 10 mg every 4 as needed for moderate pain and acetaminophen 660 mg p.o. 3 times daily, and on Fentanyl 12.5 mcg every 2 hours as needed for severe pain -Continue with bowel regimen with Senokot 8.6 mg p.o. twice daily, docusate 100 mL p.o. twice daily  -DG Knee done and showed "No evidence of acute osseous abnormality. Stable degenerative change." -Given hip rotation orthopedic surgery is obtaining an x-ray and it showed "Left hip arthroplasty in expected alignment. No periprosthetic lucency or fracture. Decreasing soft tissue edema in the subcutaneous tissues. Bony pelvis is intact. Right hip is normally located." -Orthopedic surgery recommending following up in 10 days after discharge              Schizoaffective Disorder, Bipolar Type with uncontrollable crying and agitation Intermittently  -Continue usual home medical regimen and continue with aripiprazole 50 mg p.o. hemodialysis  center history, benztropine 0.5 mg p.o. twice daily and continue Valproic Acid 250 mg po TID -Patient was hysterically crying and extremely agitated and anxious today -Continue Mirtazapine 15 mg p.o. nightly -Utilize as needed Haldol for agitation and resumed yesterday   ESRD on HD MWF Hyperphosphatemia, improved with Dialysis  Elevated Anion  Gap -Ongoing issues with dialysis cannulation now corrected status post PTA to the left brachial vein outflow stenosis to 14 mm by the interventional radiology team Dr. Loreta Ave on 03/04/2023 -BUN/Cr Trend: Recent Labs  Lab 03/01/23 0809 03/03/23 0135 03/06/23 0630 03/07/23 0251 03/08/23 0523 03/09/23 0158 03/10/23 0110  BUN 61* 47* 53* 36* 58* 34* 56*  CREATININE 9.11* 7.36* 7.31* 4.73* 6.81* 4.34* 6.11*  -Phos Level Trend: Recent Labs  Lab 03/01/23 0809 03/03/23 0135 03/06/23 0630 03/07/23 0251 03/08/23 0523 03/09/23 0158 03/10/23 0110  PHOS 7.8* 6.1* 7.0* 4.3 7.5* 4.7* 5.7*  -Patient has an elevated anion gap of 16, CO2 27, chloride level 92 -Further Care per Nephrology and she needs to dialyze in the chair prior to being discharged and was dialyzed in the bed so we will attempt to set her up in the chair tomorrow for her to resume her outpatient dialysis Severe -C/w Sevelamer Carbonate 1600 mg po TID -Avoid Nephrotoxic Medications, Contrast Dyes, Hypotension and Dehydration to Ensure Adequate Renal Perfusion and will need to Renally Adjust Meds -Continue to Monitor and Trend Renal Function carefully and repeat CMP in the AM   Sinus Tachycardia, improving but persists -Likely Do to Pain and Agitation however if persist so we will do an infectious workup and obtain blood cultures and chest x-ray and urinalysis to further evaluate but she has no infectious symptoms noted -Will give a small 250 mL bolus  -C/w Supportive Measures -Give BB with Metoprolol Tartrate IV 5 mg x1 and then start po Metoprolol Tartrate 12.5 mg po BID -Continue to Monitor on Telemetry and repeat EKG in the AM  Leukocytosis, slightly elevated -Mild and likely Reactive and ? Due to Pain now since persisting will do an infectious workup and get blood cultures x 2, urinalysis and chest x-ray -WBC Trend: Recent Labs  Lab 03/03/23 0135 03/06/23 0630 03/07/23 0251 03/08/23 0523 03/09/23 0158  03/09/23 0836 03/10/23 0110  WBC 7.1 9.2 10.5 9.5 12.3* 11.8* 13.4*  -Continue to Monitor for S/Sx of Infection -Repeat CBC in the AM  Hyponatremia -Na+ Trend: Recent Labs  Lab 03/01/23 0809 03/03/23 0135 03/06/23 0630 03/07/23 0251 03/08/23 0523 03/09/23 0158 03/10/23 0110  NA 134* 137 135 132* 129* 135 132*  -Continue to Monitor and Trend and Repeat CMP within 1 week  Anemia of Chronic Kidney Disease compounded by Postoperative Anemia Hemoglobin has stabilized at approximately 9 the last few days but dropped a little today -Receives Darbepoetin Alfa 60 mcg sq qMon -Hgb/Hct Trend: Recent Labs  Lab 03/03/23 0135 03/06/23 0630 03/07/23 0251 03/08/23 0523 03/09/23 0158 03/09/23 0836 03/10/23 0110  HGB 9.6* 7.9* 7.6* 9.4* 9.3* 9.6* 9.0*  HCT 29.3* 24.7* 23.7* 29.3* 28.3* 29.6* 28.2*  MCV 93.6 94.3 93.3 89.1 90.1 90.8 89.5  -Orthopedic surgery is typed and screened and transfused 1 unit of PRBCs given her drop in hemoglobin hematocrit and tachycardia on 03/07/23 -Continue to monitor for signs and symptoms of bleeding; no overt bleeding noted -Repeat CBC in the AM   Malnutrition of Moderate Degree  -Related to chronic illness (ESRD, schizoaffective disorder) as evidenced by moderate fat depletion, severe fat depletion, moderate muscle depletion -Nutrition Status: Nutrition  Problem: Moderate Malnutrition Etiology: chronic illness (ESRD, schizoaffective disorder) Signs/Symptoms: moderate fat depletion, severe fat depletion, moderate muscle depletion Interventions: Ensure Enlive (each supplement provides 350kcal and 20 grams of protein)  GERD/GI Prophylaxis -C/w Pantoprazole 40 mg po Daily  Hypoalbuminemia -Patient's Albumin Trend: Recent Labs  Lab 03/01/23 0809 03/03/23 0135 03/06/23 0630 03/07/23 0251 03/08/23 0523 03/09/23 0158 03/10/23 0110  ALBUMIN 2.7* 2.4* 2.4* 2.3* 2.5* 2.4* 2.3*  -Continue to Monitor and Trend and repeat CMP in the AM   DVT  prophylaxis: heparin injection 5,000 Units Start: 02/28/23 2200 SCDs Start: 02/28/23 1334    Code Status: Full Code Family Communication: No family present at bedside   Disposition Plan:  Level of care: Med-Surg Status is: Inpatient Remains inpatient appropriate because: Being worked up for Leukocytosis and Tachycardia and SNF bed is being requested to be changed by the legal guardian and will need to ensure that her dialysis center moves as well.   Consultants:  Nephrology Orthopedic Surgery Interventional Radiology  Procedures:  As delineated as above  Antimicrobials:  Anti-infectives (From admission, onward)    Start     Dose/Rate Route Frequency Ordered Stop   02/28/23 1430  ceFAZolin (ANCEF) IVPB 2g/100 mL premix  Status:  Discontinued        2 g 200 mL/hr over 30 Minutes Intravenous Every 6 hours 02/28/23 1334 02/28/23 1344   02/28/23 1147  vancomycin (VANCOCIN) powder  Status:  Discontinued          As needed 02/28/23 1147 02/28/23 1216   02/28/23 0830  ceFAZolin (ANCEF) IVPB 2g/100 mL premix        2 g 200 mL/hr over 30 Minutes Intravenous On call to O.R. 02/28/23 0731 02/28/23 1033   02/28/23 0830  vancomycin (VANCOCIN) IVPB 1000 mg/200 mL premix        1,000 mg 200 mL/hr over 60 Minutes Intravenous On call to O.R. 02/28/23 1610 02/28/23 [id number]       Subjective: Seen and examined at bedside and she was little agitated.  Not crying but was very upset still.  She denies any shortness of breath and unable to elicit if she having pain.  No nausea or vomiting.  WBC continues to trend upward and she continues to have sinus tachycardia with heart rates in the low 100s to 110s.  We will guardian has now requested to change to SNF facility again.  Objective: Vitals:   03/09/23 2110 03/10/23 0515 03/10/23 0842 03/10/23 1650  BP: 129/60 (!) 109/58 102/61 (!) 110/57  Pulse:  (!) 110 (!) 116 (!) 110  Resp: 16 20 15    Temp: 98.8 F (37.1 C) 97.8 F (36.6 C) 98.7 F (37.1 C)  99.4 F (37.4 C)  TempSrc: Oral  Oral Oral  SpO2:  100% 96% 100%  Weight:      Height:        Intake/Output Summary (Last 24 hours) at 03/10/2023 1852 Last data filed at 03/10/2023 1320 Gross per 24 hour  Intake 720 ml  Output --  Net 720 ml   Filed Weights   03/04/23 1213 03/04/23 1726 03/05/23 0426  Weight: 49.4 kg 48.3 kg 48.3 kg   Examination: Physical Exam:  Constitutional: Thin cachectic chronically ill-appearing African-American female who is agitated and Respiratory: Diminished to auscultation bilaterally, no wheezing, rales, rhonchi or crackles. Normal respiratory effort and patient is not tachypenic. No accessory muscle use.  Unlabored breathing Cardiovascular: Tachycardic rate but regular rhythm, no murmurs / rubs / gallops. S1 and S2  auscultated. No extremity edema.  Abdomen: Soft, non-tender, non-distended.  Bowel sounds positive.  GU: Deferred. Musculoskeletal: No clubbing / cyanosis of digits/nails. No joint deformity upper and lower extremities.  Skin: No rashes, lesions, ulcers on limited skin evaluation. No induration; Warm and dry.  Neurologic: CN 2-12 grossly intact with no focal deficits. Romberg sign and cerebellar reflexes not assessed.  Psychiatric: Impaired judgment and insight.  She is awake but not fully alert and oriented  Data Reviewed: I have personally reviewed following labs and imaging studies  CBC: Recent Labs  Lab 03/07/23 0251 03/08/23 0523 03/09/23 0158 03/09/23 0836 03/10/23 0110  WBC 10.5 9.5 12.3* 11.8* 13.4*  NEUTROABS 6.8 6.7 9.1* 8.9* 10.3*  HGB 7.6* 9.4* 9.3* 9.6* 9.0*  HCT 23.7* 29.3* 28.3* 29.6* 28.2*  MCV 93.3 89.1 90.1 90.8 89.5  PLT 305 335 276 289 297   Basic Metabolic Panel: Recent Labs  Lab 03/06/23 0630 03/07/23 0251 03/08/23 0523 03/09/23 0158 03/10/23 0110  NA 135 132* 129* 135 132*  K 4.7 4.5 5.3* 4.3 4.9  CL 92* 90* 88* 92* 89*  CO2 24 26 24 27 26   GLUCOSE 83 103* 82 105* 103*  BUN 53* 36* 58* 34* 56*   CREATININE 7.31* 4.73* 6.81* 4.34* 6.11*  CALCIUM 9.9 9.8 10.1 10.0 10.1  MG  --  1.7 1.8 1.8 1.8  PHOS 7.0* 4.3 7.5* 4.7* 5.7*   GFR: Estimated Creatinine Clearance: 6.3 mL/min (A) (by C-G formula based on SCr of 6.11 mg/dL (H)). Liver Function Tests: Recent Labs  Lab 03/06/23 0630 03/07/23 0251 03/08/23 0523 03/09/23 0158 03/10/23 0110  AST  --  21 20 21 21   ALT  --  6 5 6 6   ALKPHOS  --  50 62 65 58  BILITOT  --  0.7 0.6 0.6 0.6  PROT  --  6.4* 6.6 6.6 6.5  ALBUMIN 2.4* 2.3* 2.5* 2.4* 2.3*   No results for input(s): "LIPASE", "AMYLASE" in the last 168 hours. No results for input(s): "AMMONIA" in the last 168 hours. Coagulation Profile: No results for input(s): "INR", "PROTIME" in the last 168 hours. Cardiac Enzymes: No results for input(s): "CKTOTAL", "CKMB", "CKMBINDEX", "TROPONINI" in the last 168 hours. BNP (last 3 results) No results for input(s): "PROBNP" in the last 8760 hours. HbA1C: No results for input(s): "HGBA1C" in the last 72 hours. CBG: No results for input(s): "GLUCAP" in the last 168 hours. Lipid Profile: No results for input(s): "CHOL", "HDL", "LDLCALC", "TRIG", "CHOLHDL", "LDLDIRECT" in the last 72 hours. Thyroid Function Tests: No results for input(s): "TSH", "T4TOTAL", "FREET4", "T3FREE", "THYROIDAB" in the last 72 hours. Anemia Panel: No results for input(s): "VITAMINB12", "FOLATE", "FERRITIN", "TIBC", "IRON", "RETICCTPCT" in the last 72 hours. Sepsis Labs: No results for input(s): "PROCALCITON", "LATICACIDVEN" in the last 168 hours.  No results found for this or any previous visit (from the past 240 hour(s)).   Radiology Studies: DG CHEST PORT 1 VIEW  Result Date: 03/10/2023 CLINICAL DATA:  Leukocytosis EXAM: PORTABLE CHEST 1 VIEW COMPARISON:  02/26/2023 FINDINGS: Stable mild cardiomegaly. Aortic atherosclerosis. No focal airspace consolidation, pleural effusion, or pneumothorax. Skin fold overlies the left chest. Left upper extremity  vascular stent. IMPRESSION: No active disease. Electronically Signed   By: Duanne Guess D.O.   On: 03/10/2023 12:42    Scheduled Meds:  sodium chloride   Intravenous Once   acetaminophen  650 mg Oral TID   benztropine  0.5 mg Oral BID   Chlorhexidine Gluconate Cloth  6 each Topical  Q0600   darbepoetin (ARANESP) injection - NON-DIALYSIS  60 mcg Subcutaneous Q Mon-1800   docusate sodium  100 mg Oral BID   feeding supplement  237 mL Oral BID BM   heparin  5,000 Units Subcutaneous Q8H   metoprolol tartrate  12.5 mg Oral BID   mirtazapine  15 mg Oral QHS   multivitamin  1 tablet Oral QHS   mouth rinse  15 mL Mouth Rinse 4 times per day   pantoprazole (PROTONIX) IV  40 mg Intravenous Q12H   senna  1 tablet Oral BID   sevelamer carbonate  1,600 mg Oral TID WC   valproic acid  250 mg Oral TID WC   Continuous Infusions:  sodium chloride      LOS: 12 days   Marguerita Merles, DO [facility] Available via Epic secure chat 7am-7pm After these hours, please refer to coverage provider listed on [email or web address] 03/10/2023, 6:52 PM

## 2023-03-11 DIAGNOSIS — K921 Melena: Secondary | ICD-10-CM

## 2023-03-11 DIAGNOSIS — K5641 Fecal impaction: Secondary | ICD-10-CM

## 2023-03-11 DIAGNOSIS — F209 Schizophrenia, unspecified: Secondary | ICD-10-CM

## 2023-03-11 DIAGNOSIS — F250 Schizoaffective disorder, bipolar type: Secondary | ICD-10-CM | POA: Diagnosis not present

## 2023-03-11 DIAGNOSIS — K625 Hemorrhage of anus and rectum: Secondary | ICD-10-CM

## 2023-03-11 DIAGNOSIS — D638 Anemia in other chronic diseases classified elsewhere: Secondary | ICD-10-CM

## 2023-03-11 DIAGNOSIS — E43 Unspecified severe protein-calorie malnutrition: Secondary | ICD-10-CM

## 2023-03-11 DIAGNOSIS — D72829 Elevated white blood cell count, unspecified: Secondary | ICD-10-CM | POA: Diagnosis not present

## 2023-03-11 DIAGNOSIS — K922 Gastrointestinal hemorrhage, unspecified: Secondary | ICD-10-CM

## 2023-03-11 DIAGNOSIS — Z992 Dependence on renal dialysis: Secondary | ICD-10-CM | POA: Diagnosis not present

## 2023-03-11 DIAGNOSIS — S72002D Fracture of unspecified part of neck of left femur, subsequent encounter for closed fracture with routine healing: Secondary | ICD-10-CM | POA: Diagnosis not present

## 2023-03-11 DIAGNOSIS — E440 Moderate protein-calorie malnutrition: Secondary | ICD-10-CM | POA: Diagnosis not present

## 2023-03-11 DIAGNOSIS — N186 End stage renal disease: Secondary | ICD-10-CM | POA: Diagnosis not present

## 2023-03-11 LAB — CBC
HCT: 28.4 % — ABNORMAL LOW (ref 36.0–46.0)
Hemoglobin: 9.2 g/dL — ABNORMAL LOW (ref 12.0–15.0)
MCH: 29.4 pg (ref 26.0–34.0)
MCHC: 32.4 g/dL (ref 30.0–36.0)
MCV: 90.7 fL (ref 80.0–100.0)
Platelets: 323 10*3/uL (ref 150–400)
RBC: 3.13 MIL/uL — ABNORMAL LOW (ref 3.87–5.11)
RDW: 19.5 % — ABNORMAL HIGH (ref 11.5–15.5)
WBC: 21.3 10*3/uL — ABNORMAL HIGH (ref 4.0–10.5)
nRBC: 0 % (ref 0.0–0.2)

## 2023-03-11 LAB — CULTURE, BLOOD (ROUTINE X 2): Culture: NO GROWTH

## 2023-03-11 MED ORDER — POLYETHYLENE GLYCOL 3350 17 G PO PACK
17.0000 g | PACK | Freq: Two times a day (BID) | ORAL | Status: DC
  Administered 2023-03-12 – 2023-03-23 (×10): 17 g via ORAL
  Filled 2023-03-11 (×16): qty 1

## 2023-03-11 MED ORDER — ALBUMIN HUMAN 25 % IV SOLN
INTRAVENOUS | Status: AC
  Administered 2023-03-11: 25 g
  Filled 2023-03-11: qty 100

## 2023-03-11 MED ORDER — GLYCERIN (LAXATIVE) 2 G RE SUPP
1.0000 | Freq: Once | RECTAL | Status: DC
  Filled 2023-03-11: qty 1

## 2023-03-11 MED ORDER — ALBUMIN HUMAN 25 % IV SOLN
12.5000 g | Freq: Once | INTRAVENOUS | Status: AC
  Administered 2023-03-11: 12.5 g via INTRAVENOUS
  Filled 2023-03-11: qty 50

## 2023-03-11 MED ORDER — HEPARIN SODIUM (PORCINE) 1000 UNIT/ML DIALYSIS
40.0000 [IU]/kg | INTRAMUSCULAR | Status: DC | PRN
  Administered 2023-03-11: 1900 [IU] via INTRAVENOUS_CENTRAL
  Filled 2023-03-11 (×2): qty 2

## 2023-03-11 NOTE — Progress Notes (Signed)
Received patient in bed.Blood pressures were on the soft side.Renal M.D made aware.Orders done and carried out.   Medicines given : Heparin 1,600 unit pre-run dose.                               Albumin 25 g.  Access used: Left upper arm AVF that worked well.Surgical suture noted.  Duration of treatment: 3.5 hours.  Fluid removed : Even as ordered.  Hand off to the patient's HD night nurse.

## 2023-03-11 NOTE — Plan of Care (Signed)
  Problem: Education: Goal: Knowledge of General Education information will improve Description: Including pain rating scale, medication(s)/side effects and non-pharmacologic comfort measures Outcome: Progressing   Problem: Health Behavior/Discharge Planning: Goal: Ability to manage health-related needs will improve Outcome: Progressing   Problem: Clinical Measurements: Goal: Will remain free from infection Outcome: Progressing Goal: Diagnostic test results will improve Outcome: Progressing   Problem: Activity: Goal: Risk for activity intolerance will decrease Outcome: Progressing   Problem: Pain Managment: Goal: General experience of comfort will improve Outcome: Progressing   Problem: Safety: Goal: Ability to remain free from injury will improve Outcome: Progressing   

## 2023-03-11 NOTE — Progress Notes (Signed)
       Overnight   NAME: Madison [name] MRN: [id number] DOB : Jan 13, 1951  (Remote notification floor coverage)  Date of Service   03/11/2023   HPI/Events of Note    Notified by RN and RR RN for patient passing blood/clots per rectum. Patient in bed, tracks RNs and moves extremities. She squeezes hands to command  Pupils EARRL per RR RN assessment   HR-100, BP-94/56, RR-22, SpO2-100% on RA  CBC STAT ordered Initial value is 9.0   Telemetry monitoring ordered     Interventions/ Plan   Albumin ordered due to low value and concern for BP  Tele monitoring ordered CBC ordered       Update 0515 hrs.  Hgb - 9.2  BP 92/63 (73) HR 100 RR 16 SPO2 - 100   Latest Reference Range & Units 03/11/23 04:45  Hemoglobin 12.0 - 15.0 g/dL 9.2 (L)  (L): Data is abnormally low  Chinita Greenland BSN MSNA MSN ACNPC-AG Acute Care Nurse Practitioner [facility] [place]

## 2023-03-11 NOTE — Progress Notes (Signed)
RN and tech went into change patient, and noticed that patient had passed huge clots from rectum with a foul smell. Vitals taken, MD notified, RR RN called, and Charged RN notified.

## 2023-03-11 NOTE — Progress Notes (Signed)
PROGRESS NOTE    Madison [name]  ZOX:[id number] DOB: [birth year] DOA: 02/26/2023 PCP: Charlynne Pander, MD   Brief Narrative:  The patient is a 72 year old SNF resident with past medical history significant for but limited to ESRD on hemodialysis Monday Wednesday Friday, hypertension, history of schizophrenia versus bipolar disorder as well as other comorbidities who presented to the [facility] ER after a fall which occurred multiple days prior.  X-rays at [facility] ER revealed a left femoral fracture and she was transferred to [facility] given that she needed a combination of the orthopedist as well as inpatient dialysis.  She underwent unipolar Hemi arthroplasty of the left hip on 02/28/2023 and has not been deemed stable from a orthopedic standpoint.  She had issues with her dialysis catheter which has not been fixed and she is getting dialysis.  She was extremely agitated and hysterically crying yesterday and remains emotionally labile and tearful however was calmer.  We were anticipating discharging back to SNF in next 24 to 48 hours with resumption of her normal dialysis session on Friday, 03/08/2023 however a barrier to discharge was that the patient will need to be able to tolerate sitting in a chair for 4 to 5 hours to be appropriate for outpatient HD for discharge.  She was able to dialyze in the chair today appropriately and had no issues with that but heart rate remained elevated in the setting of pain and agitation so she was initiated on a beta-blocker and given some pain medication.   Heart rate was improved however she now has a slight leukocytosis and after repeat is trending down.  TOC attempted to call the facility and got an response so unfortunately could not discharge yesterday given that no one at the bedside picked up.  Today the case worker is informed that the patient's legal guardian was unhappy with the SNF choice and requested to change her SNF placement to [facility].  [facility]  is able to take the patient at their facility as early as tomorrow however because of the symptoms changed her dialysis center will need to be moved.    CBC continue to further worsen as she had bright red blood per rectum overnight and was found to be impacted today.  Having large stool ball and her bowel regimen has been escalated and obtain CT scan.  GI was consulted given her hemoglobin and bleeding and they are recommending ongoing endoscopic procedures at this time.  Will need to continue monitor carefully.  Assessment and Plan:  Left Femoral Neck Fracture -Further Care per Orthopedic Surgery - s/p unipolar hemiarthroplasty of the left hip 02/28/2023  -Appears to be stable from an orthopedic standpoint they are recommending weightbearing as tolerated on the left lower extremity with assistance and posterior hip precautions for 12 weeks with daily wound care as needed and VTE prophylaxis with subcu heparin for 28 days -C/w with pain control and she is on oxycodone 5 to 10 mg every 4 as needed for moderate pain and acetaminophen 660 mg p.o. 3 times daily, and on Fentanyl 12.5 mcg every 2 hours as needed for severe pain -Continue with bowel regimen with Senokot 8.6 mg p.o. twice daily, docusate 100 mL p.o. twice daily  -DG Knee done and showed "No evidence of acute osseous abnormality. Stable degenerative change." -Given hip rotation orthopedic surgery is obtaining an x-ray and it showed "Left hip arthroplasty in expected alignment. No periprosthetic lucency or fracture. Decreasing soft tissue edema in the subcutaneous tissues.  Bony pelvis is intact. Right hip is normally located." -Orthopedic surgery recommending following up in 10 days after discharge              Schizoaffective Disorder, Bipolar Type with uncontrollable crying and agitation Intermittently  -Continue usual home medical regimen and continue with aripiprazole 50 mg p.o. hemodialysis center history, benztropine 0.5 mg p.o. twice  daily and continue Valproic Acid 250 mg po TID -Patient was hysterically crying and extremely agitated and anxious today but today she remains not wanting to interact -Given that the nurse felt it was unsafe for her to swallow will get SLP evaluation patient is on pain medications prior to this -Continue Mirtazapine 15 mg p.o. nightly -Utilize as needed Haldol for agitation and resumed yesterday   ESRD on HD MWF Hyperphosphatemia, improved with Dialysis  Elevated Anion Gap -Ongoing issues with dialysis cannulation now corrected status post PTA to the left brachial vein outflow stenosis to 14 mm by the interventional radiology team Dr. Loreta Ave on 03/04/2023 -BUN/Cr Trend: Recent Labs  Lab 03/01/23 0809 03/03/23 0135 03/06/23 0630 03/07/23 0251 03/08/23 0523 03/09/23 0158 03/10/23 0110  BUN 61* 47* 53* 36* 58* 34* 56*  CREATININE 9.11* 7.36* 7.31* 4.73* 6.81* 4.34* 6.11*  -Phos Level Trend: Recent Labs  Lab 03/01/23 0809 03/03/23 0135 03/06/23 0630 03/07/23 0251 03/08/23 0523 03/09/23 0158 03/10/23 0110  PHOS 7.8* 6.1* 7.0* 4.3 7.5* 4.7* 5.7*  -Patient has an elevated anion gap of 17, chloride level of 89 and CO2 level 26 -Further Care per Nephrology and she needs to dialyze in the chair prior to being discharged and was dialyzed in the bed so we will attempt to set her up in the chair tomorrow for her to resume her outpatient dialysis Severe -C/w Sevelamer Carbonate 1600 mg po TID -Avoid Nephrotoxic Medications, Contrast Dyes, Hypotension and Dehydration to Ensure Adequate Renal Perfusion and will need to Renally Adjust Meds -Continue to Monitor and Trend Renal Function carefully and repeat CMP in the AM   Sinus Tachycardia, improving but persists -In setting of GI bleeding and will get a CT scan of the abdomen pelvis -Will give a small 250 mL bolus  -C/w Supportive Measures -Give BB with Metoprolol Tartrate IV 5 mg x1 and then start po Metoprolol Tartrate 12.5 mg po BID we  will discontinue the metoprolol given that heart rate is improved -CT scan being obtained and pending -Continue to Monitor on Telemetry and repeat EKG in the AM  Leukocytosis, in the setting of BRBPR -Initially thought to be reactive but then trended up and now in the setting of likely GI bleeding and constipation with concern for sterile coral colitis -Infectious etiology workup is negative so far and chest x-ray showed no acute disease -Blood cultures x 2 pending -Cannot get a urinalysis given her oliguria from her ESRD -Obtaining a CT scan of the abdomen pelvis is still pending -WBC Trend: Recent Labs  Lab 03/06/23 0630 03/07/23 0251 03/08/23 0523 03/09/23 0158 03/09/23 0836 03/10/23 0110 03/11/23 0445  WBC 9.2 10.5 9.5 12.3* 11.8* 13.4* 21.3*  -Continue to Monitor for S/Sx of Infection -Repeat CBC in the AM  Hyponatremia -Na+ Trend: Recent Labs  Lab 03/01/23 0809 03/03/23 0135 03/06/23 0630 03/07/23 0251 03/08/23 0523 03/09/23 0158 03/10/23 0110  NA 134* 137 135 132* 129* 135 132*  -Continue to Monitor and Trend and Repeat CMP within 1 week  Anemia of Chronic Kidney Disease compounded by Postoperative Anemia and now BRBPR and Concern for GI Bleed  Hemoglobin has stabilized at approximately 9 the last few days but dropped a little today -Receives Darbepoetin Alfa 60 mcg sq qMon -Hgb/Hct Trend: Recent Labs  Lab 03/06/23 0630 03/07/23 0251 03/08/23 0523 03/09/23 0158 03/09/23 0836 03/10/23 0110 03/11/23 0445  HGB 7.9* 7.6* 9.4* 9.3* 9.6* 9.0* 9.2*  HCT 24.7* 23.7* 29.3* 28.3* 29.6* 28.2* 28.4*  MCV 94.3 93.3 89.1 90.1 90.8 89.5 90.7  -Orthopedic surgery is typed and screened and transfused 1 unit of PRBCs given her drop in hemoglobin hematocrit and tachycardia on 03/07/23 -Continue to monitor for signs and symptoms of bleeding; Now passing bloody stools with clots and Impacted -Repeat CBC in the AM -GI consulted for further evaluation and hemoglobin remained  stable and they are recommending escalating her bowel regimen and continue to monitor hemoglobin -Had to be manually disimpacted today was found to have a large ball of stool by the nurse last documented bowel regimen and bowel movement on 03/03/2023   Malnutrition of Moderate Degree  -Related to chronic illness (ESRD, schizoaffective disorder) as evidenced by moderate fat depletion, severe fat depletion, moderate muscle depletion -Nutrition Status: Nutrition Problem: Moderate Malnutrition Etiology: chronic illness (ESRD, schizoaffective disorder) Signs/Symptoms: moderate fat depletion, severe fat depletion, moderate muscle depletion Interventions: Ensure Enlive (each supplement provides 350kcal and 20 grams of protein)  GERD/GI Prophylaxis -C/w Pantoprazole 40 mg po Daily  Hypoalbuminemia -Patient's Albumin Trend: Recent Labs  Lab 03/01/23 0809 03/03/23 0135 03/06/23 0630 03/07/23 0251 03/08/23 0523 03/09/23 0158 03/10/23 0110  ALBUMIN 2.7* 2.4* 2.4* 2.3* 2.5* 2.4* 2.3*  -Continue to Monitor and Trend and repeat CMP in the AM   DVT prophylaxis: SCDs Start: 02/28/23 1334    Code Status: Full Code Family Communication: No family at bedside and attempted to call the [employer] and she did not pick up the phone  Disposition Plan:  Level of care: Med-Surg Status is: Inpatient Remains inpatient appropriate because: Has an unsafe discharge plan given that the patient's SNF is being changed and she will need to be clipped a new dialysis unit and she is unsafe to discharge at this time given her medical issues given she is not having bright red blood per rectum   Consultants:  Nephrology Orthopedic surgery Radiology Gastroenterology  Procedures:  As delineated as above  Antimicrobials:  Anti-infectives (From admission, onward)    Start     Dose/Rate Route Frequency Ordered Stop   02/28/23 1430  ceFAZolin (ANCEF) IVPB 2g/100 mL premix  Status:  Discontinued         2 g 200 mL/hr over 30 Minutes Intravenous Every 6 hours 02/28/23 1334 02/28/23 1344   02/28/23 1147  vancomycin (VANCOCIN) powder  Status:  Discontinued          As needed 02/28/23 1147 02/28/23 1216   02/28/23 0830  ceFAZolin (ANCEF) IVPB 2g/100 mL premix        2 g 200 mL/hr over 30 Minutes Intravenous On call to O.R. 02/28/23 0731 02/28/23 1033   02/28/23 0830  vancomycin (VANCOCIN) IVPB 1000 mg/200 mL premix        1,000 mg 200 mL/hr over 60 Minutes Intravenous On call to O.R. 02/28/23 2956 02/28/23 0858       Subjective: Seen and examined at bedside and was little bit more lethargic and responded for painful stimuli but wanted be left alone arrest.  Was not hysterically crying but nurse was disimpacting her and she is having bloody stools noted.  Unable to provide a subjective History due  to current condition.  Objective: Vitals:   03/11/23 1632 03/11/23 1705 03/11/23 1731 03/11/23 1803  BP: (!) 92/49 96/69 (!) 102/51 90/63  Pulse: 96 99 (!) 101 (!) 103  Resp: 16 16 16 18   Temp:      TempSrc:      SpO2: 96% 96% 97% 97%  Weight:      Height:        Intake/Output Summary (Last 24 hours) at 03/11/2023 1813 Last data filed at 03/11/2023 1002 Gross per 24 hour  Intake --  Output 1 ml  Net -1 ml   Filed Weights   03/04/23 1213 03/04/23 1726 03/05/23 0426  Weight: 49.4 kg 48.3 kg 48.3 kg   Examination: Physical Exam:  Constitutional: Thin cachectic chronically ill-appearing African-American female who is unable to provide a subjective history given that she is little drowsy and somnolent Respiratory: Diminished to auscultation bilaterally, no wheezing, rales, rhonchi or crackles. Normal respiratory effort and patient is not tachypenic. No accessory muscle use.  Unlabored breathing Cardiovascular: Mildly tachycardic, no murmurs / rubs / gallops. S1 and S2 auscultated.  Abdomen: Soft, tender to palpate and slightly distended. Bowel sounds positive.  GU:  Deferred. Musculoskeletal: No clubbing / cyanosis of digits/nails. No joint deformity upper and lower extremities.  Skin: No rashes, lesions, ulcers on limited skin evaluation. No induration; Warm and dry.  Neurologic: She is is slightly somnolent and drowsy Psychiatric: Impaired judgment and disease stage is a little somnolent and drowsy  Data Reviewed: I have personally reviewed following labs and imaging studies  CBC: Recent Labs  Lab 03/07/23 0251 03/08/23 0523 03/09/23 0158 03/09/23 0836 03/10/23 0110 03/11/23 0445  WBC 10.5 9.5 12.3* 11.8* 13.4* 21.3*  NEUTROABS 6.8 6.7 9.1* 8.9* 10.3*  --   HGB 7.6* 9.4* 9.3* 9.6* 9.0* 9.2*  HCT 23.7* 29.3* 28.3* 29.6* 28.2* 28.4*  MCV 93.3 89.1 90.1 90.8 89.5 90.7  PLT 305 335 276 289 297 323   Basic Metabolic Panel: Recent Labs  Lab 03/06/23 0630 03/07/23 0251 03/08/23 0523 03/09/23 0158 03/10/23 0110  NA 135 132* 129* 135 132*  K 4.7 4.5 5.3* 4.3 4.9  CL 92* 90* 88* 92* 89*  CO2 24 26 24 27 26   GLUCOSE 83 103* 82 105* 103*  BUN 53* 36* 58* 34* 56*  CREATININE 7.31* 4.73* 6.81* 4.34* 6.11*  CALCIUM 9.9 9.8 10.1 10.0 10.1  MG  --  1.7 1.8 1.8 1.8  PHOS 7.0* 4.3 7.5* 4.7* 5.7*   GFR: Estimated Creatinine Clearance: 6.3 mL/min (A) (by C-G formula based on SCr of 6.11 mg/dL (H)). Liver Function Tests: Recent Labs  Lab 03/06/23 0630 03/07/23 0251 03/08/23 0523 03/09/23 0158 03/10/23 0110  AST  --  21 20 21 21   ALT  --  6 5 6 6   ALKPHOS  --  50 62 65 58  BILITOT  --  0.7 0.6 0.6 0.6  PROT  --  6.4* 6.6 6.6 6.5  ALBUMIN 2.4* 2.3* 2.5* 2.4* 2.3*   No results for input(s): "LIPASE", "AMYLASE" in the last 168 hours. No results for input(s): "AMMONIA" in the last 168 hours. Coagulation Profile: No results for input(s): "INR", "PROTIME" in the last 168 hours. Cardiac Enzymes: No results for input(s): "CKTOTAL", "CKMB", "CKMBINDEX", "TROPONINI" in the last 168 hours. BNP (last 3 results) No results for input(s):  "PROBNP" in the last 8760 hours. HbA1C: No results for input(s): "HGBA1C" in the last 72 hours. CBG: No results for input(s): "GLUCAP" in the last 168  hours. Lipid Profile: No results for input(s): "CHOL", "HDL", "LDLCALC", "TRIG", "CHOLHDL", "LDLDIRECT" in the last 72 hours. Thyroid Function Tests: No results for input(s): "TSH", "T4TOTAL", "FREET4", "T3FREE", "THYROIDAB" in the last 72 hours. Anemia Panel: No results for input(s): "VITAMINB12", "FOLATE", "FERRITIN", "TIBC", "IRON", "RETICCTPCT" in the last 72 hours. Sepsis Labs: No results for input(s): "PROCALCITON", "LATICACIDVEN" in the last 168 hours.  Recent Results (from the past 240 hour(s))  Culture, blood (Routine X 2) w Reflex to ID Panel     Status: None (Preliminary result)   Collection Time: 03/10/23  9:51 AM   Specimen: BLOOD  Result Value Ref Range Status   Specimen Description BLOOD SITE NOT SPECIFIED  Final   Special Requests   Final    BOTTLES DRAWN AEROBIC AND ANAEROBIC Blood Culture results may not be optimal due to an inadequate volume of blood received in culture bottles   Culture   Final    NO GROWTH < 24 HOURS Performed at [facility], [address], [place], [place] [address]    Report Status PENDING  Incomplete  Culture, blood (Routine X 2) w Reflex to ID Panel     Status: None (Preliminary result)   Collection Time: 03/10/23  9:51 AM   Specimen: BLOOD  Result Value Ref Range Status   Specimen Description BLOOD SITE NOT SPECIFIED  Final   Special Requests   Final    AEROBIC BOTTLE ONLY Blood Culture results may not be optimal due to an inadequate volume of blood received in culture bottles   Culture   Final    NO GROWTH < 24 HOURS Performed at [facility], [address], [place], [place] [address]    Report Status PENDING  Incomplete    Radiology Studies: DG CHEST PORT 1 VIEW  Result Date: 03/10/2023 CLINICAL DATA:  Leukocytosis EXAM: PORTABLE CHEST 1 VIEW COMPARISON:  02/26/2023  FINDINGS: Stable mild cardiomegaly. Aortic atherosclerosis. No focal airspace consolidation, pleural effusion, or pneumothorax. Skin fold overlies the left chest. Left upper extremity vascular stent. IMPRESSION: No active disease. Electronically Signed   By: Duanne Guess D.O.   On: 03/10/2023 12:42    Scheduled Meds:  sodium chloride   Intravenous Once   acetaminophen  650 mg Oral TID   benztropine  0.5 mg Oral BID   Chlorhexidine Gluconate Cloth  6 each Topical Q0600   darbepoetin (ARANESP) injection - NON-DIALYSIS  60 mcg Subcutaneous Q Mon-1800   docusate sodium  100 mg Oral BID   feeding supplement  237 mL Oral BID BM   Glycerin (Adult)  1 suppository Rectal Once   metoprolol tartrate  12.5 mg Oral BID   mirtazapine  15 mg Oral QHS   multivitamin  1 tablet Oral QHS   mouth rinse  15 mL Mouth Rinse 4 times per day   pantoprazole (PROTONIX) IV  40 mg Intravenous Q12H   polyethylene glycol  17 g Oral BID   senna  1 tablet Oral BID   sevelamer carbonate  1,600 mg Oral TID WC   valproic acid  250 mg Oral TID WC   Continuous Infusions:   LOS: 13 days   Marguerita Merles, DO [facility] Available via Epic secure chat 7am-7pm After these hours, please refer to coverage provider listed on [email or web address] 03/11/2023, 6:13 PM

## 2023-03-11 NOTE — Progress Notes (Signed)
Contacted by CSW this am with request to change pt's out-pt HD clinic due to snf placement. Pt currently receives out-pt HD at [facility]. Per CSW, snf is agreeable to pt being transferred to [facility] MWF if possible. Transfer request submitted to [facility] admissions for review. Also contacted [facility] to provide update and left message at [facility] to provide update to them as well. Update provided to nephrologist. Will assist as needed.   Olivia Canter Renal Navigator [phone]

## 2023-03-11 NOTE — Consult Note (Signed)
Consultation  Referring Provider: Dr. Marland Mcalpine    Primary Care Physician:  Charlynne Pander, MD Primary Gastroenterologist:   Dr. Jonette Eva- Gentry Fitz here     Reason for Consultation:   GI bleed         HPI:   Madison [name] is a 72 y.o. female with past medical history as listed below including ESRD on HD, schizoaffective disorder, bipolar type, who initially presented to the hospital on 02/26/2023 after a fall.  She was found to have a left femoral hip fracture.  Underwent left hip hemiarthroplasty on 02/28/2023.  We are consulted now in regards to a large amount of bright red blood per rectum with clots overnight.    At time of my interview patient is non-verbal so history is garnered from the nurses in the room after having just cleaned the patient.  She describes 2 bowel movements with bright red blood and some clots, apparently after cleaning the patient just recently she found a large ball of stool in the rectum which she manually disimpacted, tells me it was almost a softball sized amount, apparently last recorded stool was on 03/03/2023.  Patient's vocalizations have may be decreased slightly over the past couple of days per the nurse, but she has not been with him previously so unsure.  Most recent care notes from hospitalist team/others:    03/10/2023 hospitalist note discusses that patient underwent hemiarthroplasty left hip on 6/6 and had issues with dialysis catheter.  They were anticipating discharge back to SNF in the next 24 to 48 hours however patient was unable to tolerate sitting in chair for 4 to 5 hours to be appropriate for outpatient hemodialysis for discharge.  Yesterday noted to have slight leukocytosis.    03/11/2023 per nurses note patient had passed huge clots from the rectum with a foul smell.    03/11/2023 hemoglobin 9.2 (9.0 on 6/16)  GI history: 01/14/18 capsule endoscopy: Report is scanned in but it does not look like it shows anything 12/19/2017 EGD: Done for anemia  and weight loss with gastritis and anemia likely due to chronic disease medium sized hiatal hernia 01/20/2016 colonoscopy: With one 12 mm polyp in the transverse colon/mid transverse colon, four 3-5 mm polyps in the rectum and sigmoid colon; pathology showed tubular adenoma and hyperplastic polyps  Past Medical History:  Diagnosis Date   Anxiety    Bipolar disorder (HCC)    ESRD (end stage renal disease) on dialysis (HCC)    Neuropathy    Schizophrenia (HCC)     Past Surgical History:  Procedure Laterality Date   CHOLECYSTECTOMY     COLONOSCOPY N/A 01/20/2016   Dr. Darrick Penna: 12 mm tubular adenoma removed from the transverse colon, 4 hyperplastic polyps removed from the rectum and sigmoid colon.  Next colonoscopy planned for April 2020.   ESOPHAGOGASTRODUODENOSCOPY N/A 12/19/2017   Procedure: ESOPHAGOGASTRODUODENOSCOPY (EGD);  Surgeon: West Bali, MD;  [facility];  Service: Endoscopy;  Laterality: N/A;  8:30am   fistula left arm     GIVENS CAPSULE STUDY N/A 01/14/2018   Procedure: GIVENS CAPSULE STUDY;  Surgeon: West Bali, MD;  Location: [facility];  Service: Endoscopy;  Laterality: N/A;  7:30am   HEMICOLECTOMY Right    HIP ARTHROPLASTY Left 02/28/2023   Procedure: ARTHROPLASTY BIPOLAR HIP (HEMIARTHROPLASTY);  Surgeon: Myrene Galas, MD;  Location: [facility] OR;  Service: Orthopedics;  Laterality: Left;   IR AV DIALY SHUNT INTRO NEEDLE/INTRACATH INITIAL W/PTA/IMG LEFT  03/04/2023  IR US GUIDE VASC ACCESS LEFT  03/04/2023   TUBAL LIGATION      Family History  Problem Relation Age of Onset   Alzheimer's disease Mother    Cancer Mother        pancreatic   Diabetes Mother    Prostate cancer Father    Kidney failure Sister    Breast cancer Neg Hx    Colon cancer Neg Hx     Social History   Tobacco Use   Smoking status: Never   Smokeless tobacco: Never  Vaping Use   Vaping Use: Never used  Substance Use Topics   Alcohol use: No   Drug use: No    Prior to  Admission medications   Medication Sig Start Date End Date Taking? Authorizing Provider  ARIPiprazole (ABILIFY) 15 MG tablet Take 15 mg by mouth daily.   Yes [provider]  aspirin EC 81 MG tablet Take 81 mg by mouth daily. Swallow whole.   Yes [provider]  B Complex-C-Folic Acid (DIALYVITE TABLET) TABS Take 1 tablet by mouth at bedtime. 03/31/20  Yes [provider]  benztropine (COGENTIN) 0.5 MG tablet Take 0.5 mg by mouth 2 (two) times daily.   Yes [provider]  chlorhexidine (HIBICLENS) 4 % external liquid Apply 15 mLs (1 Application total) topically as directed for 30 doses. Use as directed daily for 5 days every other week for 6 weeks. 02/28/23  Yes Montez Morita, PA-C  loperamide (IMODIUM) 2 MG capsule Take 1 capsule (2 mg total) by mouth as needed for diarrhea or loose stools. 12/30/22  Yes Shah, Pratik D, DO  midodrine (PROAMATINE) 5 MG tablet Take 15 mg by mouth in the morning and at bedtime.   Yes [provider]  mirtazapine (REMERON) 15 MG tablet Take 15 mg by mouth at bedtime.   Yes [provider]  mupirocin ointment (BACTROBAN) 2 % Place 1 Application into the nose 2 (two) times daily for 60 doses. Use as directed 2 times daily for 5 days every other week for 6 weeks. 02/28/23 03/30/23 Yes Montez Morita, PA-C  Nutritional Supplements (NUTRITIONAL SUPPLEMENT PO) Take 1 Container by mouth 2 (two) times daily. Magic Cup   Yes [provider]  omeprazole (PRILOSEC) 20 MG capsule Take 20 mg by mouth daily.   Yes [provider]  ondansetron (ZOFRAN) 4 MG tablet Take 1 tablet (4 mg total) by mouth daily as needed for nausea or vomiting. 12/30/22 12/30/23 Yes Shah, Pratik D, DO  sevelamer (RENAGEL) 800 MG tablet Take 800 mg by mouth 3 (three) times daily with meals.   Yes [provider]  thiamine (VITAMIN B-1) 100 MG tablet Take 0.5 tablets (50 mg total) by mouth daily. 01/08/19  Yes Clapacs, Jackquline Denmark, MD  UNABLE TO FIND  Take 30 mLs by mouth 2 (two) times daily. Med Name: Liquid Protein   Yes [provider]  valproic acid (DEPAKENE) 250 MG/5ML solution Take 250 mg by mouth 3 (three) times daily with meals. 08/14/22  Yes [provider]  acetaminophen (TYLENOL) 325 MG tablet Take 2 tablets (650 mg total) by mouth 3 (three) times daily. 03/07/23   Montez Morita, PA-C  docusate sodium (COLACE) 100 MG capsule Take 1 capsule (100 mg total) by mouth 2 (two) times daily. 03/07/23   Montez Morita, PA-C  heparin 5000 UNIT/ML injection Inject 1 mL (5,000 Units total) into the skin every 8 (eight) hours for 21 days. 03/07/23 03/28/23  Montez Morita,  PA-C  oxyCODONE (OXY IR/ROXICODONE) 5 MG immediate release tablet Take 1-2 tablets (5-10 mg total) by mouth every 8 (eight) hours as needed for moderate pain or severe pain. 03/07/23   Montez Morita, PA-C    Current Facility-Administered Medications  Medication Dose Route Frequency Provider Last Rate Last Admin   0.9 %  sodium chloride infusion (Manually program via Guardrails IV Fluids)   Intravenous Once Montez Morita, PA-C       acetaminophen (TYLENOL) tablet 650 mg  650 mg Oral TID Montez Morita, PA-C   650 mg at 03/10/23 1652   ARIPiprazole (ABILIFY) tablet 15 mg  15 mg Oral Q dialysis Montez Morita, PA-C       benztropine (COGENTIN) tablet 0.5 mg  0.5 mg Oral BID Montez Morita, PA-C   0.5 mg at 03/10/23 0802   Chlorhexidine Gluconate Cloth 2 % PADS 6 each  6 each Topical Q0600 Montez Morita, PA-C   6 each at 03/11/23 0865   Darbepoetin Alfa (ARANESP) injection 60 mcg  60 mcg Subcutaneous Q Mon-1800 Dagoberto Ligas, MD   60 mcg at 03/04/23 1834   docusate sodium (COLACE) capsule 100 mg  100 mg Oral BID Montez Morita, PA-C   100 mg at 03/10/23 0802   feeding supplement (ENSURE ENLIVE / ENSURE PLUS) liquid 237 mL  237 mL Oral BID BM SheikhKateri Mc Laurel, DO   237 mL at 03/10/23 1319   fentaNYL (SUBLIMAZE) injection 12.5 mcg  12.5 mcg Intravenous Q2H PRN Montez Morita, PA-C   12.5 mcg at  03/06/23 1432   Glycerin (Adult) 2 g suppository 1 suppository  1 suppository Rectal Once Luiz Iron, NP       haloperidol lactate (HALDOL) injection 1-2 mg  1-2 mg Intravenous Q6H PRN Marguerita Merles Latif, DO   2 mg at 03/06/23 1328   menthol-cetylpyridinium (CEPACOL) lozenge 3 mg  1 lozenge Oral PRN Montez Morita, PA-C       Or   phenol (CHLORASEPTIC) mouth spray 1 spray  1 spray Mouth/Throat PRN Montez Morita, PA-C       metoprolol tartrate (LOPRESSOR) tablet 12.5 mg  12.5 mg Oral BID Sheikh, Omair Latif, DO   12.5 mg at 03/10/23 0802   mirtazapine (REMERON) tablet 15 mg  15 mg Oral QHS Montez Morita, PA-C   15 mg at 03/09/23 2112   multivitamin (RENA-VIT) tablet 1 tablet  1 tablet Oral QHS Lonia Blood, MD   1 tablet at 03/09/23 2112   ondansetron (ZOFRAN) tablet 4 mg  4 mg Oral Q6H PRN Montez Morita, PA-C       Or   ondansetron [facility] injection 4 mg  4 mg Intravenous Q6H PRN Montez Morita, PA-C       Oral care mouth rinse  15 mL Mouth Rinse 4 times per day Marguerita Merles Latif, DO   15 mL at 03/10/23 1709   Oral care mouth rinse  15 mL Mouth Rinse PRN Marguerita Merles Latif, DO       oxyCODONE (Oxy IR/ROXICODONE) immediate release tablet 5-10 mg  5-10 mg Oral Q4H PRN Montez Morita, PA-C   10 mg at 03/10/23 1206   pantoprazole (PROTONIX) injection 40 mg  40 mg Intravenous Q12H Sheikh, Kateri Mc [place], DO   40 mg at 03/10/23 0815   senna (SENOKOT) tablet 8.6 mg  1 tablet Oral BID Montez Morita, PA-C   8.6 mg at 03/10/23 0802   sevelamer carbonate (RENVELA) tablet 1,600 mg  1,600 mg Oral TID WC Allena Katz,  Hartley Barefoot, MD   1,600 mg at 03/10/23 1314   valproic acid (DEPAKENE) 250 MG/5ML solution 250 mg  250 mg Oral TID WC Montez Morita, PA-C   250 mg at 03/10/23 1652    Allergies as of 02/25/2023   (No Known Allergies)     Review of Systems:    Unable to obtain- patient non-verbal   Physical Exam:  Vital signs in last 24 hours: Temp:  [98.2 F (36.8 C)-99.4 F (37.4 C)] 99.4 F (37.4 C) (06/17  0725) Pulse Rate:  [96-132] 98 (06/17 0725) Resp:  [16-37] 16 (06/17 0725) BP: (91-110)/(54-63) 91/54 (06/17 0725) SpO2:  [79 %-100 %] 96 % (06/17 0725) Last BM Date : 03/03/23 General: Chronically ill-appearing, thin, African-American female appears to be in NAD, alert + with moaning and screaming and other vocalizations Head:  Normocephalic and atraumatic. Eyes:   PEERL, EOMI. No icterus. Conjunctiva pink. Ears:  Normal auditory acuity. Neck:  Supple Throat: Oral cavity and pharynx without inflammation, swelling or lesion.  Lungs: Respirations even and unlabored. Lungs clear to auscultation bilaterally.   No wheezes, crackles, or rhonchi.  Heart: Normal S1, S2. No MRG. Regular rate and rhythm. No peripheral edema, cyanosis or pallor.  Abdomen:  Soft, nondistended, nontender. No rebound or guarding.increased BS all four quadrants, No appreciable masses or hepatomegaly. Rectal:  Not performed.  Msk:  Symmetrical without gross deformities. Peripheral pulses intact.  Extremities:  Without edema, no deformity or joint abnormality.  Neurologic:  Alert  Skin:   Dry and intact without significant lesions or rashes. Psychiatric: alert and awake   LAB RESULTS: Recent Labs    03/09/23 0836 03/10/23 0110 03/11/23 0445  WBC 11.8* 13.4* 21.3*  HGB 9.6* 9.0* 9.2*  HCT 29.6* 28.2* 28.4*  PLT 289 297 323   BMET Recent Labs    03/09/23 0158 03/10/23 0110  NA 135 132*  K 4.3 4.9  CL 92* 89*  CO2 27 26  GLUCOSE 105* 103*  BUN 34* 56*  CREATININE 4.34* 6.11*  CALCIUM 10.0 10.1   LFT Recent Labs    03/10/23 0110  PROT 6.5  ALBUMIN 2.3*  AST 21  ALT 6  ALKPHOS 58  BILITOT 0.6   STUDIES: DG CHEST PORT 1 VIEW  Result Date: 03/10/2023 CLINICAL DATA:  Leukocytosis EXAM: PORTABLE CHEST 1 VIEW COMPARISON:  02/26/2023 FINDINGS: Stable mild cardiomegaly. Aortic atherosclerosis. No focal airspace consolidation, pleural effusion, or pneumothorax. Skin fold overlies the left chest. Left  upper extremity vascular stent. IMPRESSION: No active disease. Electronically Signed   By: Duanne Guess D.O.   On: 03/10/2023 12:42      Impression / Plan:   Impression: 1.  GI bleed: Reported bright red blood with clots x 2 overnight, nursing staff tells me they had to disimpact her this morning with a large ball of hard stool found in the rectum, CT has been ordered, hemoglobin stable 9.0--> 9.2, last colonoscopy in 2017 with polyps; most likely sterile coral colitis 2.  Left femoral neck fracture: Status post unipolar hemithyroid arthroplasty of the left hip 6/6, currently on a bowel regimen with Senokot twice daily, Docusate twice daily 3.  Schizoaffective disorder/bipolar type with uncontrollable crying and agitation intermittently 4.  ESRD on HD Monday Wednesday Friday 5.  Sinus tachycardia 6.  Leukocytosis 7.  Anemia of chronic disease compounded by postoperative anemia: hemoglobin typically around 9, was as low as 7.6 on 03/07/2023 and had rebounded to 9 yesterday 8.  Malnutrition 9.  GERD: Currently  on Pantoprazole 40 daily  Plan: 1.  At this time no plan for emergent procedures unless hemoglobin drops and patient continues with bleeding.  Most likely this is result of stercoral colitis.  Patient had not had a documented bowel movement in 8 days and was found to have a large ball of stool in her rectum by nursing which was just disimpacted. 2.  Recommend a better bowel regimen.  Will add MiraLAX twice daily as well to her Senokot twice daily and Colace twice daily. 3.  Could follow with serial x-rays to see state of stool 4.  Again if hemoglobin dropping or further bleeding will need to consider flex sig or other while inpatient 5. From GI standpoint can stay on previous diet  Thank you for your kind consultation, we will continue to follow along.  Violet Baldy [facility]  03/11/2023, 8:42 AM

## 2023-03-11 NOTE — Progress Notes (Addendum)
PT Cancellation Note  Patient Details Name: Madison [name] MRN: [id number] DOB: 10/27/1950   Cancelled Treatment:    Reason Eval/Treat Not Completed: Patient at procedure or test/unavailable  Patient is currently at HD. Will request team see tomorrow and attempt OOB as tolerated. Appears she has been able to stand pivot transfer on a couple of occasions per notes (with max/total assist.)   Kathlyn Sacramento, PT, DPT [facility]  Rehabilitation Services Physical Therapist Office: [phone] Website: [email or web address]   Berton Mount 03/11/2023, 3:20 PM

## 2023-03-11 NOTE — Progress Notes (Signed)
Patient ID: CAETLYN AY, female   DOB: 07-07-1951, 72 y.o.   MRN: [id number] [facility] Progress Note   Assessment/ Plan:   1.  Status post hemiarthroplasty for left hip fracture: Postoperative and awaiting outpatient placement to skilled nursing facility along with OP orthopedic surgery follow up.  2. ESRD: On HD per a Monday/Wednesday/Friday schedule [facility] in [place])  - tolerated HD in a recliner.   - HD today per MWF schedule (in recliner) - Renal panel today - will follow-up   3. Anemia CKD: Continue ESA - aranesp 60 mcg every Monday. Monitor for need for transfusions.  Repeat Hb  4. CKD-MBD: hyperphosphatemia - on sevelamer.  Will need a reduced ca bath for now  5. Hypertension: hypotension today.  Limited UF with HD today  6. Bright red blood per rectum - Note that GI has been consulted   7.  Leukocytosis: ongoing evaluation for possible infection source with concomitant tachycardia.  Antibiotics per primary team discretion   8.  Increased stool burden - s/p manual disimpaction this AM per nursing. Team is getting imaging today   9. Schizoaffective disorder - Her baseline is still unclear to me but per staff (HD SW) she does have a guardian   Disposition - she is changing SNF's and will need a new outpatient dialysis unit.  (She is not able to be discharged without a new outpatient HD unit confirmed)   Subjective:    Last HD on 6/14 with 1.9 kg UF.  No urine output over 6/16.  She had an in/out cath with a "drop of urine" noted per charting.  Rapid response was called overnight for BRB per rectum.  Per nursing patient was oriented to person at one point yesterday/overnight.  She was in pain so has just gotten a fentanyl bolus.  Nursing reports patient passing clots per rectum overnight. She had manual disimpaction of a large stool ball.  I was contacted by HD SW and the patient's guardian has chosen a new SNF to go to at d/c.  We are changing  her outpatient HD unit to a unit closer to the new SNF - this has not been approved yet.       Review of systems:  Unable to obtain secondary to AMS     Objective:   BP (!) 91/54 (BP Location: Right Arm)   Pulse 98   Temp 99.4 F (37.4 C) (Oral)   Resp 16   Ht 5\' 4"  (1.626 m)   Wt 48.3 kg   SpO2 96%   BMI 18.28 kg/m   Physical Exam:  General elderly female in bed in no acute distress HEENT normocephalic atraumatic extraocular movements intact sclera anicteric Neck supple trachea midline Lungs clear to auscultation bilaterally normal work of breathing at rest  Heart regular rate and rhythm no rubs or gallops appreciated Abdomen soft nontender nondistended Extremities no edema  Neuro - patient with minimal verbal response to exam which is consistent with the 6/17 exam per charting. She is not oriented to person - does not answer this question; tracks visually  Psych no anxiety or agitation  Access LUE AVF bruit and thrill     Labs: BMET Recent Labs  Lab 03/06/23 0630 03/07/23 0251 03/08/23 0523 03/09/23 0158 03/10/23 0110  NA 135 132* 129* 135 132*  K 4.7 4.5 5.3* 4.3 4.9  CL 92* 90* 88* 92* 89*  CO2 24 26 24 27 26   GLUCOSE 83 103* 82 105* 103*  BUN 53* 36* 58* 34* 56*  CREATININE 7.31* 4.73* 6.81* 4.34* 6.11*  CALCIUM 9.9 9.8 10.1 10.0 10.1  PHOS 7.0* 4.3 7.5* 4.7* 5.7*   CBC Recent Labs  Lab 03/08/23 0523 03/09/23 0158 03/09/23 0836 03/10/23 0110 03/11/23 0445  WBC 9.5 12.3* 11.8* 13.4* 21.3*  NEUTROABS 6.7 9.1* 8.9* 10.3*  --   HGB 9.4* 9.3* 9.6* 9.0* 9.2*  HCT 29.3* 28.3* 29.6* 28.2* 28.4*  MCV 89.1 90.1 90.8 89.5 90.7  PLT 335 276 289 297 323      Medications:     sodium chloride   Intravenous Once   acetaminophen  650 mg Oral TID   benztropine  0.5 mg Oral BID   Chlorhexidine Gluconate Cloth  6 each Topical Q0600   darbepoetin (ARANESP) injection - NON-DIALYSIS  60 mcg Subcutaneous Q Mon-1800   docusate sodium  100 mg Oral BID   feeding  supplement  237 mL Oral BID BM   Glycerin (Adult)  1 suppository Rectal Once   metoprolol tartrate  12.5 mg Oral BID   mirtazapine  15 mg Oral QHS   multivitamin  1 tablet Oral QHS   mouth rinse  15 mL Mouth Rinse 4 times per day   pantoprazole (PROTONIX) IV  40 mg Intravenous Q12H   senna  1 tablet Oral BID   sevelamer carbonate  1,600 mg Oral TID WC   valproic acid  250 mg Oral TID WC   Estanislado Emms, MD 03/11/2023, 10:48 AM

## 2023-03-11 NOTE — Significant Event (Addendum)
Rapid Response Event Note   Reason for Call :  GIB  Called originally at [phone] as a second set of eyes for pt with new onset BRBPR. Per RN, pt VSS, pt responsive, and MD notified with order for CBC.  Pt seen at 0429.  Initial Focused Assessment:  Pt lying in bed with eyes open. She will moan and move all extremities to pain. She will track across the room.  She appears to squeeze her hands to command but this is inconsistent. Pupils 4, equal. Lungs clear/diminished. Skin cool to touch.  She has had a large amount BRBPR with clots.  HR-100, BP-94/56, RR-22, SpO2-100% on RA. Interventions:  CBC-hgb 9.2(was 9.0 Telemetry monitoring.  Plan of Care:  Hgb currently stable. Monitor pt closely. Please call RRT if further assistance needed.   Event Summary:   MD Notified: Valli Glance NP notified PTA RRT Call [phone] Arrival [phone] End Time:0507  Terrilyn Saver, RN

## 2023-03-11 NOTE — TOC Progression Note (Signed)
Transition of Care [facility]) - Progression Note    Patient Details  Name: Madison [name] MRN: [id number] Date of Birth: 16-Apr-1951  Transition of [facility]) CM/SW Contact  Erin Sons, [place] Phone Number: 03/11/2023, 12:52 PM  Clinical Narrative:     CSW confirmed with [facility] that pt can be switched to [facility] or [facility] in [place]. Renal navigator notified; she will work on changing OPHD clinic in order for pt to admit to [facility]  Expected Discharge Plan: Skilled Nursing Facility Barriers to Discharge: Continued Medical Work up, SNF Pending bed offer  Expected Discharge Plan and Services In-house Referral: Clinical Social Work   Post Acute Care Choice: Skilled Nursing Facility Living arrangements for the past 2 months: Skilled Nursing Facility ([facility])                                       Social Determinants of Health (SDOH) Interventions SDOH Screenings   Alcohol Screen: Low Risk  (12/25/2018)  Tobacco Use: Low Risk  (03/04/2023)    Readmission Risk Interventions    12/27/2022    8:27 AM  Readmission Risk Prevention Plan  Transportation Screening Complete  HRI or Home Care Consult Complete  Social Work Consult for Recovery Care Planning/Counseling Complete  Palliative Care Screening Not Applicable  Medication Review Ocean[employer]) Complete

## 2023-03-11 NOTE — Progress Notes (Signed)
Succeeded with I and O catheter, however, only a drop of tea colored urine returned in bag.  Not enough to get sample for urinalysis per order.  Notified MD.

## 2023-03-11 NOTE — Progress Notes (Signed)
Pt had bloody clots in bed incontinent of stool.  No stool on bed.  Turned pt and found hugh amount of type 3 stool balled up at base of rectum inside with blood clots intertwining stool.  Digitally impacted pt with large amount of brown stool and clot within.  Notified Attending and GI dr face to face at [facility] after cleaning up.  Pt resting now, but unable to formulate sentences.  Disoriented x 4.  Will hold po meds for now.

## 2023-03-12 ENCOUNTER — Inpatient Hospital Stay (HOSPITAL_COMMUNITY): Payer: Medicare Other

## 2023-03-12 DIAGNOSIS — N186 End stage renal disease: Secondary | ICD-10-CM | POA: Diagnosis not present

## 2023-03-12 DIAGNOSIS — F250 Schizoaffective disorder, bipolar type: Secondary | ICD-10-CM | POA: Diagnosis not present

## 2023-03-12 DIAGNOSIS — D72829 Elevated white blood cell count, unspecified: Secondary | ICD-10-CM | POA: Diagnosis not present

## 2023-03-12 DIAGNOSIS — K922 Gastrointestinal hemorrhage, unspecified: Secondary | ICD-10-CM | POA: Diagnosis not present

## 2023-03-12 DIAGNOSIS — S72002D Fracture of unspecified part of neck of left femur, subsequent encounter for closed fracture with routine healing: Secondary | ICD-10-CM | POA: Diagnosis not present

## 2023-03-12 DIAGNOSIS — K5900 Constipation, unspecified: Secondary | ICD-10-CM

## 2023-03-12 DIAGNOSIS — E440 Moderate protein-calorie malnutrition: Secondary | ICD-10-CM | POA: Diagnosis not present

## 2023-03-12 DIAGNOSIS — Z992 Dependence on renal dialysis: Secondary | ICD-10-CM | POA: Diagnosis not present

## 2023-03-12 LAB — MAGNESIUM: Magnesium: 1.7 mg/dL (ref 1.7–2.4)

## 2023-03-12 LAB — CBC WITH DIFFERENTIAL/PLATELET
Abs Immature Granulocytes: 0.1 10*3/uL — ABNORMAL HIGH (ref 0.00–0.07)
Basophils Absolute: 0.1 10*3/uL (ref 0.0–0.1)
Basophils Relative: 0 %
Eosinophils Absolute: 0.1 10*3/uL (ref 0.0–0.5)
Eosinophils Relative: 0 %
HCT: 25.9 % — ABNORMAL LOW (ref 36.0–46.0)
Hemoglobin: 8.2 g/dL — ABNORMAL LOW (ref 12.0–15.0)
Immature Granulocytes: 1 %
Lymphocytes Relative: 8 %
Lymphs Abs: 1.1 10*3/uL (ref 0.7–4.0)
MCH: 28.3 pg (ref 26.0–34.0)
MCHC: 31.7 g/dL (ref 30.0–36.0)
MCV: 89.3 fL (ref 80.0–100.0)
Monocytes Absolute: 1.1 10*3/uL — ABNORMAL HIGH (ref 0.1–1.0)
Monocytes Relative: 7 %
Neutro Abs: 11.9 10*3/uL — ABNORMAL HIGH (ref 1.7–7.7)
Neutrophils Relative %: 84 %
Platelets: 297 10*3/uL (ref 150–400)
RBC: 2.9 MIL/uL — ABNORMAL LOW (ref 3.87–5.11)
RDW: 19.3 % — ABNORMAL HIGH (ref 11.5–15.5)
WBC: 14.3 10*3/uL — ABNORMAL HIGH (ref 4.0–10.5)
nRBC: 0 % (ref 0.0–0.2)

## 2023-03-12 LAB — CULTURE, BLOOD (ROUTINE X 2)

## 2023-03-12 LAB — COMPREHENSIVE METABOLIC PANEL
ALT: 7 U/L (ref 0–44)
AST: 19 U/L (ref 15–41)
Albumin: 2.8 g/dL — ABNORMAL LOW (ref 3.5–5.0)
Alkaline Phosphatase: 54 U/L (ref 38–126)
Anion gap: 17 — ABNORMAL HIGH (ref 5–15)
BUN: 27 mg/dL — ABNORMAL HIGH (ref 8–23)
CO2: 24 mmol/L (ref 22–32)
Calcium: 10.4 mg/dL — ABNORMAL HIGH (ref 8.9–10.3)
Chloride: 94 mmol/L — ABNORMAL LOW (ref 98–111)
Creatinine, Ser: 3.55 mg/dL — ABNORMAL HIGH (ref 0.44–1.00)
GFR, Estimated: 13 mL/min — ABNORMAL LOW (ref >60)
Glucose, Bld: 81 mg/dL (ref 70–99)
Potassium: 4.5 mmol/L (ref 3.5–5.1)
Sodium: 135 mmol/L (ref 135–145)
Total Bilirubin: 0.8 mg/dL (ref 0.3–1.2)
Total Protein: 7 g/dL (ref 6.5–8.1)

## 2023-03-12 LAB — PHOSPHORUS: Phosphorus: 4.4 mg/dL (ref 2.5–4.6)

## 2023-03-12 MED ORDER — DARBEPOETIN ALFA 100 MCG/0.5ML IJ SOSY: 100.0000 ug | PREFILLED_SYRINGE | INTRAMUSCULAR | Status: DC

## 2023-03-12 MED ORDER — CHLORHEXIDINE GLUCONATE CLOTH 2 % EX PADS
6.0000 | MEDICATED_PAD | Freq: Every day | CUTANEOUS | Status: DC
  Administered 2023-03-13 – 2023-03-17 (×4): 6 via TOPICAL

## 2023-03-12 MED ORDER — PEG-KCL-NACL-NASULF-NA ASC-C 100 G PO SOLR
0.5000 | Freq: Once | ORAL | Status: AC
  Administered 2023-03-12: 100 g via ORAL
  Filled 2023-03-12: qty 1

## 2023-03-12 MED ORDER — SORBITOL 70 % SOLN
960.0000 mL | TOPICAL_OIL | Freq: Once | ORAL | Status: AC
  Administered 2023-03-13: 960 mL via RECTAL
  Filled 2023-03-12: qty 240

## 2023-03-12 MED ORDER — DOCUSATE SODIUM 50 MG/5ML PO LIQD
100.0000 mg | Freq: Two times a day (BID) | ORAL | Status: DC
  Administered 2023-03-16 – 2023-03-23 (×12): 100 mg via ORAL
  Filled 2023-03-12 (×19): qty 10

## 2023-03-12 MED ORDER — MILK AND MOLASSES ENEMA
1.0000 | Freq: Once | RECTAL | Status: AC
  Administered 2023-03-12: 240 mL via RECTAL
  Filled 2023-03-12: qty 240

## 2023-03-12 MED ORDER — BOOST / RESOURCE BREEZE PO LIQD CUSTOM
1.0000 | Freq: Three times a day (TID) | ORAL | Status: DC
  Administered 2023-03-12 – 2023-03-19 (×12): 1 via ORAL
  Filled 2023-03-12: qty 1

## 2023-03-12 MED ORDER — PEG-KCL-NACL-NASULF-NA ASC-C 100 G PO SOLR: 0.5000 | Freq: Once | ORAL | Status: DC

## 2023-03-12 NOTE — Evaluation (Signed)
Clinical/Bedside Swallow Evaluation Patient Details  Name: Madison [name] MRN: [id number] Date of Birth: 1951-07-25  Today's Date: 03/12/2023 Time: SLP Start Time (ACUTE ONLY): 1212 SLP Stop Time (ACUTE ONLY): 1230 SLP Time Calculation (min) (ACUTE ONLY): 18 min  Past Medical History:  Past Medical History:  Diagnosis Date   Anxiety    Bipolar disorder (HCC)    ESRD (end stage renal disease) on dialysis (HCC)    Neuropathy    Schizophrenia (HCC)    Past Surgical History:  Past Surgical History:  Procedure Laterality Date   CHOLECYSTECTOMY     COLONOSCOPY N/A 01/20/2016   Dr. Darrick Penna: 12 mm tubular adenoma removed from the transverse colon, 4 hyperplastic polyps removed from the rectum and sigmoid colon.  Next colonoscopy planned for April 2020.   ESOPHAGOGASTRODUODENOSCOPY N/A 12/19/2017   Procedure: ESOPHAGOGASTRODUODENOSCOPY (EGD);  Surgeon: West Bali, MD;  Location: [facility];  Service: Endoscopy;  Laterality: N/A;  8:30am   fistula left arm     GIVENS CAPSULE STUDY N/A 01/14/2018   Procedure: GIVENS CAPSULE STUDY;  Surgeon: West Bali, MD;  Location: [facility];  Service: Endoscopy;  Laterality: N/A;  7:30am   HEMICOLECTOMY Right    HIP ARTHROPLASTY Left 02/28/2023   Procedure: ARTHROPLASTY BIPOLAR HIP (HEMIARTHROPLASTY);  Surgeon: Myrene Galas, MD;  Location: [facility] OR;  Service: Orthopedics;  Laterality: Left;   IR AV DIALY SHUNT INTRO NEEDLE/INTRACATH INITIAL W/PTA/IMG LEFT  03/04/2023   IR US GUIDE VASC ACCESS LEFT  03/04/2023   TUBAL LIGATION     HPI:  72 y.o. female admitted from SNF 6/4 for fall and hip pain, s/p UNIPOLAR HEMIARTHROPLASTY OF THE LEFT HIP on 6/6. Has had poor PO intake; chart states she is lethargic and "unable to swallow." Recent  hematochezia, GI following.  PMHx: history of ESRD, HTN, and schizophrenia versus bipolar disorder.    Assessment / Plan / Recommendation  Clinical Impression  Pt was seating in recliner, slightly weepy but  consolable and able to participate in swallowing assessment. She did not follow commands; was talking intermittently with low volume.  She did not demonstrate s/s of a mechanical dysphagia.  Per RN and NT report, her swallowing/eating has waxed and waned depending on mental status. Today she demonstrated recognition of approaching utensils/straw, consumed 2/3 of a banana and drank 6 oz of water from a straw with no s/s of aspiration. There was adequate mastication of solids with no oral residue post-swallow.  Her mentation was sufficient to eat a regular diet/thin liquids. Recommend resuming prior diet - hold POs when she is not sufficiently alert. Give meds whole in liquid; if she coughs, give whole in puree. No SLP f/u is needed. D/W RN. SLP Visit Diagnosis: Dysphagia, unspecified (R13.10)    Aspiration Risk  No limitations    Diet Recommendation   Regular solids, thin liquids  Medication Administration: Whole meds with liquid    Other  Recommendations Oral Care Recommendations: Oral care BID    Recommendations for follow up therapy are one component of a multi-disciplinary discharge planning process, led by the attending physician.  Recommendations may be updated based on patient status, additional functional criteria and insurance authorization.  Follow up Recommendations No SLP follow up        Swallow Study   General Date of Onset: 02/26/23 HPI: 72 y.o. female admitted from SNF 6/4 for fall and hip pain, s/p UNIPOLAR HEMIARTHROPLASTY OF THE LEFT HIP on 6/6. Has had poor PO intake; chart states  she is lethargic and "unable to swallow." Recent  hematochezia, GI following.  PMHx: history of ESRD, HTN, and schizophrenia versus bipolar disorder. Type of Study: Bedside Swallow Evaluation Previous Swallow Assessment: no Diet Prior to this Study: Clear liquid diet Temperature Spikes Noted: No Respiratory Status: Room air History of Recent Intubation: No Behavior/Cognition:  Alert;Confused Oral Cavity Assessment: Within Functional Limits Oral Care Completed by SLP: No Oral Cavity - Dentition: Adequate natural dentition Self-Feeding Abilities: Total assist Patient Positioning: Upright in chair Baseline Vocal Quality: Normal Volitional Cough: Cognitively unable to elicit Volitional Swallow: Unable to elicit    Oral/Motor/Sensory Function Overall Oral Motor/Sensory Function: Within functional limits   Ice Chips Ice chips: Within functional limits   Thin Liquid Thin Liquid: Within functional limits    Nectar Thick Nectar Thick Liquid: Not tested   Honey Thick Honey Thick Liquid: Not tested   Puree Puree: Within functional limits   Solid     Solid: Within functional limits      Blenda Mounts Laurice 03/12/2023,12:36 PM Marchelle Folks L. Samson Frederic, MA CCC/SLP Clinical Specialist - Acute Care SLP Acute Rehabilitation Services Office number [phone]

## 2023-03-12 NOTE — Progress Notes (Signed)
PROGRESS NOTE    Madison [name]  UJW:[id number] DOB: 09/22/1951 DOA: 02/26/2023 PCP: Charlynne Pander, MD   Brief Narrative:  The patient is a 72 year old SNF resident with past medical history significant for but limited to ESRD on hemodialysis Monday Wednesday Friday, hypertension, history of schizophrenia versus bipolar disorder as well as other comorbidities who presented to the [facility] ER after a fall which occurred multiple days prior.  X-rays at [facility] ER revealed a left femoral fracture and she was transferred to [facility] given that she needed a combination of the orthopedist as well as inpatient dialysis.  She underwent unipolar Hemi arthroplasty of the left hip on 02/28/2023 and has not been deemed stable from a orthopedic standpoint.  She had issues with her dialysis catheter which has not been fixed and she is getting dialysis.  She was extremely agitated and hysterically crying yesterday and remains emotionally labile and tearful however was calmer.  We were anticipating discharging back to SNF in next 24 to 48 hours with resumption of her normal dialysis session on Friday, 03/08/2023 however a barrier to discharge was that the patient will need to be able to tolerate sitting in a chair for 4 to 5 hours to be appropriate for outpatient HD for discharge.  She was able to dialyze in the chair today appropriately and had no issues with that but heart rate remained elevated in the setting of pain and agitation so she was initiated on a beta-blocker and given some pain medication.   Heart rate was improved however she now has a slight leukocytosis and after repeat is trending down.  TOC attempted to call the facility and got an response so unfortunately could not discharge yesterday given that no one at the bedside picked up.  Today the case worker is informed that the patient's legal guardian was unhappy with the SNF choice and requested to change her SNF placement to [facility].  [facility]  is able to take the patient at their facility as early as tomorrow however because of the symptoms changed her dialysis center will need to be moved.    CBC continue to further worsen as she had bright red blood per rectum overnight and was found to be impacted today.  Having large stool ball and her bowel regimen has been escalated and obtain CT scan.  GI was consulted given her hemoglobin and bleeding and they are recommending ongoing endoscopic procedures at this time.  Will need to continue monitor carefully.  Assessment and Plan:  Left Femoral Neck Fracture -Further Care per Orthopedic Surgery - s/p unipolar hemiarthroplasty of the left hip 02/28/2023  -Appears to be stable from an orthopedic standpoint they are recommending weightbearing as tolerated on the left lower extremity with assistance and posterior hip precautions for 12 weeks with daily wound care as needed and VTE prophylaxis with subcu heparin for 28 days -C/w with pain control and she is on oxycodone 5 to 10 mg every 4 as needed for moderate pain and acetaminophen 660 mg p.o. 3 times daily, and on Fentanyl 12.5 mcg every 2 hours as needed for severe pain -Continue with bowel regimen with Senokot 8.6 mg p.o. twice daily, docusate 100 mL p.o. twice daily  -DG Knee done and showed "No evidence of acute osseous abnormality. Stable degenerative change." -Given hip rotation orthopedic surgery is obtaining an x-ray and it showed "Left hip arthroplasty in expected alignment. No periprosthetic lucency or fracture. Decreasing soft tissue edema in the subcutaneous tissues.  Bony pelvis is intact. Right hip is normally located." -Orthopedic surgery recommending following up in 10 days after discharge              Schizoaffective Disorder, Bipolar Type with uncontrollable crying and agitation Intermittently  -Continue usual home medical regimen and continue with aripiprazole 50 mg p.o. hemodialysis center history, benztropine 0.5 mg p.o. twice  daily and continue Valproic Acid 250 mg po TID -Patient was hysterically crying and extremely agitated and anxious today but today she remains not wanting to interact -Given that the nurse felt it was unsafe for her to swallow will get SLP evaluation therapy has cleared her for diet -Continue Mirtazapine 15 mg p.o. nightly -Utilize as needed Haldol for agitation and resumed yesterday   ESRD on HD MWF Hyperphosphatemia, improved with Dialysis  Elevated Anion Gap -Ongoing issues with dialysis cannulation now corrected status post PTA to the left brachial vein outflow stenosis to 14 mm by the interventional radiology team Dr. Loreta Ave on 03/04/2023 -BUN/Cr Trend: Recent Labs  Lab 03/03/23 0135 03/06/23 0630 03/07/23 0251 03/08/23 0523 03/09/23 0158 03/10/23 0110 03/12/23 0105  BUN 47* 53* 36* 58* 34* 56* 27*  CREATININE 7.36* 7.31* 4.73* 6.81* 4.34* 6.11* 3.55*  -Phos Level Trend: Recent Labs  Lab 03/03/23 0135 03/06/23 0630 03/07/23 0251 03/08/23 0523 03/09/23 0158 03/10/23 0110 03/12/23 0105  PHOS 6.1* 7.0* 4.3 7.5* 4.7* 5.7* 4.4  -Patient has an elevated anion gap of 17, chloride level of 89 and CO2 level 26 -Further Care per Nephrology and she needs to dialyze in the chair prior to being discharged and was dialyzed in the bed so we will attempt to set her up in the chair tomorrow for her to resume her outpatient dialysis Severe -C/w Sevelamer Carbonate 1600 mg po TID -Avoid Nephrotoxic Medications, Contrast Dyes, Hypotension and Dehydration to Ensure Adequate Renal Perfusion and will need to Renally Adjust Meds -Continue to Monitor and Trend Renal Function carefully and repeat CMP in the AM   Sinus Tachycardia, improving but persists -In setting of GI bleeding and Constipation; See CT scan of the abdomen pelvis -Given small 250 mL bolus  -C/w Supportive Measures -Give BB with Metoprolol Tartrate IV 5 mg x1 and then start po Metoprolol Tartrate 12.5 mg po BID we will  discontinue the metoprolol given that heart rate is improved -CT scan being obtained and pending -Continue to Monitor on Telemetry and repeat EKG in the AM  Constipation and Fecal Impaction -CT Scan done and showed "There is large amount of stool in rectum. There is wall thickening in rectum and perirectal stranding. Findings suggest fecal impaction and possible proctitis. There is no evidence of intestinal obstruction or pneumoperitoneum. There is no hydronephrosis." -GI Consulted and starting half a movie prep and I have given her milk of molasses enema after discussion with the nephrology team  Questionable Pneumonia -CT scan done and showed "There are small new patchy infiltrates in both lower lung fields suggesting atelectasis/pneumonia. Small bilateral pleural effusions, more so on the left side. Small pericardial effusion. Coronary artery disease." -Have a cough and will need to continue monitor and she is not awake enough to do flutter valve or incentive spirometry -If develops a productive cough will need to initiate Abx -SLP recommending Regular Diet  Leukocytosis, in the setting of BRBPR -Initially thought to be reactive but then trended up and now in the setting of likely GI bleeding and constipation with concern for sterile coral colitis -Infectious etiology workup is negative so  far and chest x-ray showed no acute disease -Blood cultures x 2 pending -Cannot get a urinalysis given her oliguria from her ESRD -CT Scan done and showed "There is large amount of stool in rectum. There is wall thickening in rectum and perirectal stranding. Findings suggest fecal impaction and possible proctitis. There is no evidence of intestinal obstruction or pneumoperitoneum. There is no hydronephrosis.There are small new patchy infiltrates in both lower lung fields suggesting atelectasis/pneumonia. Small bilateral pleural effusions,more so on the left side. Small pericardial effusion. Coronary artery  disease. Possible small 2 mm right renal stone. Multiple renal cysts. Severe lumbar spondylosis. Left hip arthroplasty. Aortic arteriosclerosis." -WBC Trend: Recent Labs  Lab 03/07/23 0251 03/08/23 0523 03/09/23 0158 03/09/23 0836 03/10/23 0110 03/11/23 0445 03/12/23 0150  WBC 10.5 9.5 12.3* 11.8* 13.4* 21.3* 14.3*  -Continue to Monitor for S/Sx of Infection and slightly went up in the setting of her concern for GI bleeding stercoral colitis and now is improving -Repeat CBC in the AM  Hyponatremia -Na+ Trend: Recent Labs  Lab 03/03/23 0135 03/06/23 0630 03/07/23 0251 03/08/23 0523 03/09/23 0158 03/10/23 0110 03/12/23 0105  NA 137 135 132* 129* 135 132* 135  -Continue to Monitor and Trend and Repeat CMP within 1 week  Anemia of Chronic Kidney Disease compounded by Postoperative Anemia and now BRBPR and Concern for GI Bleed (Likely Stercoral Colitis) Hemoglobin has stabilized at approximately 9 the last few days but dropped a little today -Receives Darbepoetin Alfa 60 mcg sq qMon -Hgb/Hct Trend: Recent Labs  Lab 03/07/23 0251 03/08/23 0523 03/09/23 0158 03/09/23 0836 03/10/23 0110 03/11/23 0445 03/12/23 0150  HGB 7.6* 9.4* 9.3* 9.6* 9.0* 9.2* 8.2*  HCT 23.7* 29.3* 28.3* 29.6* 28.2* 28.4* 25.9*  MCV 93.3 89.1 90.1 90.8 89.5 90.7 89.3  -Orthopedic surgery is typed and screened and transfused 1 unit of PRBCs given her drop in hemoglobin hematocrit and tachycardia on 03/07/23 -Continue to monitor for signs and symptoms of bleeding; Now passing bloody stools with clots and Impacted -Repeat CBC in the AM -GI consulted for further evaluation and hemoglobin remained stable and they are recommending escalating her bowel regimen and continue to monitor hemoglobin -Had to be manually disimpacted today was found to have a large ball of stool by the nurse last documented bowel regimen and bowel movement on 03/03/2023" had no further bleeding and GI feels that this is likely Stercoral  Colitis    Non-Severe Malnutrition of Moderate Degree  -Related to chronic illness (ESRD, schizoaffective disorder) as evidenced by moderate fat depletion, severe fat depletion, moderate muscle depletion -Nutrition Status: Nutrition Problem: Moderate Malnutrition Etiology: chronic illness (ESRD, schizoaffective disorder) Signs/Symptoms: moderate fat depletion, severe fat depletion, moderate muscle depletion Interventions: Refer to RD note for recommendations -May need to engage in other forms of nutrition if she continues to refuse for food and may consider consulting palliative care  GERD/GI Prophylaxis -C/w Pantoprazole 40 mg po Daily  Hypoalbuminemia -Patient's Albumin Trend: Recent Labs  Lab 03/03/23 0135 03/06/23 0630 03/07/23 0251 03/08/23 0523 03/09/23 0158 03/10/23 0110 03/12/23 0105  ALBUMIN 2.4* 2.4* 2.3* 2.5* 2.4* 2.3* 2.8*  -Continue to Monitor and Trend and repeat CMP in the AM   DVT prophylaxis: SCDs Start: 02/28/23 1334    Code Status: Full Code Family Communication: No family currently at bedside  Disposition Plan:  Level of care: Med-Surg Status is: Inpatient Remains inpatient appropriate because: Has an unsafe discharge plan given that the patient's SNF is being changed and she will need to  be clipped a new dialysis unit and she is unsafe to discharge at this time given her medical issues given her significant Constipation and recent Lower GIB   Consultants:  Nephrology Orthopedic surgery Interventional Radiology Gastroenterology  Procedures:  As delineated as above  Antimicrobials:  Anti-infectives (From admission, onward)    Start     Dose/Rate Route Frequency Ordered Stop   02/28/23 1430  ceFAZolin (ANCEF) IVPB 2g/100 mL premix  Status:  Discontinued        2 g 200 mL/hr over 30 Minutes Intravenous Every 6 hours 02/28/23 1334 02/28/23 1344   02/28/23 1147  vancomycin (VANCOCIN) powder  Status:  Discontinued          As needed 02/28/23 1147  02/28/23 1216   02/28/23 0830  ceFAZolin (ANCEF) IVPB 2g/100 mL premix        2 g 200 mL/hr over 30 Minutes Intravenous On call to O.R. 02/28/23 0731 02/28/23 1033   02/28/23 0830  vancomycin (VANCOCIN) IVPB 1000 mg/200 mL premix        1,000 mg 200 mL/hr over 60 Minutes Intravenous On call to O.R. 02/28/23 1610 02/28/23 0858       Subjective: Seen and examined at bedside and was more awake and alert but continues to be confused given her current condition and history of dementia.  Subsequently stat she started becoming hysterical and started crying to me again.  Had some abdominal discomfort when I palpate her abdomen but not as bad as yesterday.  Still does not have a safe discharge disposition given that the legal guardian wants to change her SNF and outpatient dialysis center.  Objective: Vitals:   03/12/23 0400 03/12/23 0700 03/12/23 1108 03/12/23 1500  BP: 115/70 (!) 102/57 120/63 139/80  Pulse: (!) 110  (!) 108   Resp: 20 19    Temp: 98.7 F (37.1 C) 98.5 F (36.9 C)  98.3 F (36.8 C)  TempSrc:      SpO2: 95%     Weight:      Height:        Intake/Output Summary (Last 24 hours) at 03/12/2023 1758 Last data filed at 03/12/2023 1400 Gross per 24 hour  Intake 200 ml  Output 0 ml  Net 200 ml   Filed Weights   03/04/23 1213 03/04/23 1726 03/05/23 0426  Weight: 49.4 kg 48.3 kg 48.3 kg   Examination: Physical Exam:  Constitutional: Thin cachectic chronically ill-appearing African-American female with dementia and psychiatric undertones was again unable to provide a subjective history and is more awake but then subsequently becomes extremely tearful Respiratory: Diminished to auscultation bilaterally, no wheezing, rales, rhonchi or crackles. Normal respiratory effort and patient is not tachypenic. No accessory muscle use.  Unlabored breathing Cardiovascular: Tachycardic rate and regular rhythm.  No extremity edema noted Abdomen: Soft, mildly tender today., non-distended.  Bowel sounds positive.  GU: Deferred. Musculoskeletal: No clubbing / cyanosis of digits/nails. No joint deformity upper and lower extremities.  Skin: No rashes, lesions, ulcers on a limited skin evaluation. No induration; Warm and dry.  Neurologic: Did not really want to participate given her dementia but is hysterically crying and moves extremities independently Psychiatric: Impaired judgment and insight and she was awake and then when I started talking her she started crying  Data Reviewed: I have personally reviewed following labs and imaging studies  CBC: Recent Labs  Lab 03/08/23 0523 03/09/23 0158 03/09/23 0836 03/10/23 0110 03/11/23 0445 03/12/23 0150  WBC 9.5 12.3* 11.8* 13.4* 21.3* 14.3*  NEUTROABS 6.7 9.1* 8.9* 10.3*  --  11.9*  HGB 9.4* 9.3* 9.6* 9.0* 9.2* 8.2*  HCT 29.3* 28.3* 29.6* 28.2* 28.4* 25.9*  MCV 89.1 90.1 90.8 89.5 90.7 89.3  PLT 335 276 289 297 323 297   Basic Metabolic Panel: Recent Labs  Lab 03/07/23 0251 03/08/23 0523 03/09/23 0158 03/10/23 0110 03/12/23 0105  NA 132* 129* 135 132* 135  K 4.5 5.3* 4.3 4.9 4.5  CL 90* 88* 92* 89* 94*  CO2 26 24 27 26 24   GLUCOSE 103* 82 105* 103* 81  BUN 36* 58* 34* 56* 27*  CREATININE 4.73* 6.81* 4.34* 6.11* 3.55*  CALCIUM 9.8 10.1 10.0 10.1 10.4*  MG 1.7 1.8 1.8 1.8 1.7  PHOS 4.3 7.5* 4.7* 5.7* 4.4   GFR: Estimated Creatinine Clearance: 10.8 mL/min (A) (by C-G formula based on SCr of 3.55 mg/dL (H)). Liver Function Tests: Recent Labs  Lab 03/07/23 0251 03/08/23 0523 03/09/23 0158 03/10/23 0110 03/12/23 0105  AST 21 20 21 21 19   ALT 6 5 6 6 7   ALKPHOS 50 62 65 58 54  BILITOT 0.7 0.6 0.6 0.6 0.8  PROT 6.4* 6.6 6.6 6.5 7.0  ALBUMIN 2.3* 2.5* 2.4* 2.3* 2.8*   No results for input(s): "LIPASE", "AMYLASE" in the last 168 hours. No results for input(s): "AMMONIA" in the last 168 hours. Coagulation Profile: No results for input(s): "INR", "PROTIME" in the last 168 hours. Cardiac Enzymes: No results  for input(s): "CKTOTAL", "CKMB", "CKMBINDEX", "TROPONINI" in the last 168 hours. BNP (last 3 results) No results for input(s): "PROBNP" in the last 8760 hours. HbA1C: No results for input(s): "HGBA1C" in the last 72 hours. CBG: No results for input(s): "GLUCAP" in the last 168 hours. Lipid Profile: No results for input(s): "CHOL", "HDL", "LDLCALC", "TRIG", "CHOLHDL", "LDLDIRECT" in the last 72 hours. Thyroid Function Tests: No results for input(s): "TSH", "T4TOTAL", "FREET4", "T3FREE", "THYROIDAB" in the last 72 hours. Anemia Panel: No results for input(s): "VITAMINB12", "FOLATE", "FERRITIN", "TIBC", "IRON", "RETICCTPCT" in the last 72 hours. Sepsis Labs: No results for input(s): "PROCALCITON", "LATICACIDVEN" in the last 168 hours.  Recent Results (from the past 240 hour(s))  Culture, blood (Routine X 2) w Reflex to ID Panel     Status: None (Preliminary result)   Collection Time: 03/10/23  9:51 AM   Specimen: BLOOD  Result Value Ref Range Status   Specimen Description BLOOD SITE NOT SPECIFIED  Final   Special Requests   Final    BOTTLES DRAWN AEROBIC AND ANAEROBIC Blood Culture results may not be optimal due to an inadequate volume of blood received in culture bottles   Culture   Final    NO GROWTH 2 DAYS Performed at [facility], [address] [place], [place] [address]    Report Status PENDING  Incomplete  Culture, blood (Routine X 2) w Reflex to ID Panel     Status: None (Preliminary result)   Collection Time: 03/10/23  9:51 AM   Specimen: BLOOD  Result Value Ref Range Status   Specimen Description BLOOD SITE NOT SPECIFIED  Final   Special Requests   Final    AEROBIC BOTTLE ONLY Blood Culture results may not be optimal due to an inadequate volume of blood received in culture bottles   Culture   Final    NO GROWTH 2 DAYS Performed at [facility], [address], [place], [place] [address]    Report Status PENDING  Incomplete    Radiology Studies: CT  ABDOMEN PELVIS WO CONTRAST  Result Date: 03/12/2023 CLINICAL DATA:  Abdominal pain EXAM: CT ABDOMEN AND PELVIS WITHOUT CONTRAST TECHNIQUE: Multidetector CT imaging of the abdomen and pelvis was performed following the standard protocol without IV contrast. RADIATION DOSE REDUCTION: This exam was performed according to the departmental dose-optimization program which includes automated exposure control, adjustment of the mA and/or kV according to patient size and/or use of iterative reconstruction technique. COMPARISON:  12/26/2022 FINDINGS: Lower chest: Interval appearance of small bilateral pleural effusions, more so on the left side. Small patchy infiltrates are seen in the posterior lower lung fields suggesting atelectasis/pneumonia. Small pericardial effusion is present. There are scattered coronary artery calcifications. There are scattered calcifications in aorta. Hepatobiliary: Surgical clips are seen in gallbladder fossa. Pancreas: No focal abnormalities are seen. Spleen: Unremarkable. Adrenals/Urinary Tract: Adrenals are unremarkable. Right kidney is small in size. There is 2 mm calculus in the upper pole of right kidney. There are multiple smooth marginated low-density lesions in both kidneys largest measuring 4 cm in size in the left kidney suggesting renal cysts. Ureters are unremarkable. Urinary bladder is not distended. Stomach/Bowel: Stomach is unremarkable. Small bowel loops are not dilated. The appendix is not seen. There is no pericecal inflammation. There is no significant wall thickening in colon. Large amount of stool is seen in rectum. Transverse diameter of rectum measures 8 cm. Rays wall thickening in rectum. Knees perirectal stranding. Vascular/Lymphatic: Scattered arterial calcifications are seen in aorta and its major branches. Reproductive: Unremarkable. Other: There is no ascites or pneumoperitoneum. Musculoskeletal: Severe degenerative changes are noted in lumbar spine, most prominent  at L1-L2, L3-L4 and L4-L5 levels. No significant interval changes are noted. There is left hip arthroplasty. IMPRESSION: There is large amount of stool in rectum. There is wall thickening in rectum and perirectal stranding. Findings suggest fecal impaction and possible proctitis. There is no evidence of intestinal obstruction or pneumoperitoneum. There is no hydronephrosis. There are small new patchy infiltrates in both lower lung fields suggesting atelectasis/pneumonia. Small bilateral pleural effusions, more so on the left side. Small pericardial effusion. Coronary artery disease. Possible small 2 mm right renal stone. Multiple renal cysts. Severe lumbar spondylosis. Left hip arthroplasty. Aortic arteriosclerosis. Electronically Signed   By: Ernie Avena M.D.   On: 03/12/2023 11:53    Scheduled Meds:  sodium chloride   Intravenous Once   acetaminophen  650 mg Oral TID   benztropine  0.5 mg Oral BID   Chlorhexidine Gluconate Cloth  6 each Topical Q0600   [START ON 03/13/2023] Chlorhexidine Gluconate Cloth  6 each Topical Q0600   [START ON 03/19/2023] darbepoetin (ARANESP) injection - NON-DIALYSIS  100 mcg Subcutaneous Q Tue-1800   docusate sodium  100 mg Oral BID   feeding supplement  1 Container Oral TID BM   Glycerin (Adult)  1 suppository Rectal Once   metoprolol tartrate  12.5 mg Oral BID   mirtazapine  15 mg Oral QHS   multivitamin  1 tablet Oral QHS   mouth rinse  15 mL Mouth Rinse 4 times per day   pantoprazole (PROTONIX) IV  40 mg Intravenous Q12H   peg 3350 powder  0.5 kit Oral Once   polyethylene glycol  17 g Oral BID   senna  1 tablet Oral BID   sevelamer carbonate  1,600 mg Oral TID WC   sorbitol, milk of mag, mineral oil, glycerin (SMOG) enema  960 mL Rectal Once   valproic acid  250 mg Oral TID WC   Continuous  Infusions:   LOS: 14 days   Marguerita Merles, DO [facility] Available via Epic secure chat 7am-7pm After these hours, please refer to coverage provider  listed on [email or web address] 03/12/2023, 5:58 PM

## 2023-03-12 NOTE — Progress Notes (Signed)
Occupational Therapy Treatment Patient Details Name: Madison [name] MRN: [id number] DOB: 1950-10-14 Today's Date: 03/12/2023   History of present illness 72 y.o. female admitted 6/4 for fall and hip pain, s/p UNIPOLAR HEMIARTHROPLASTY OF THE LEFT HIP on 6/6. PMHx: history of ESRD, HTN, and schizophrenia versus bipolar disorder.   OT comments  Despite discomfort to hip, patient able to stand and transition to the recliner with Mod A face to face transfer.  OT can trial a few more visits to see if her status improves, but a return to her SNF is recommended.  OT at the facility can screen for any changes, and treat from there.     Recommendations for follow up therapy are one component of a multi-disciplinary discharge planning process, led by the attending physician.  Recommendations may be updated based on patient status, additional functional criteria and insurance authorization.    Assistance Recommended at Discharge Frequent or constant Supervision/Assistance  Patient can return home with the following  Two people to help with walking and/or transfers;Direct supervision/assist for financial management;Two people to help with bathing/dressing/bathroom;Direct supervision/assist for medications management;Assistance with cooking/housework;Assist for transportation;Help with stairs or ramp for entrance;Assistance with feeding   Equipment Recommendations       Recommendations for Other Services      Precautions / Restrictions Precautions Precautions: Posterior Hip Precaution Comments: Unalbe to understand due to poor cognition Required Braces or Orthoses: Other Brace Other Brace: wedge cushion Restrictions Weight Bearing Restrictions: Yes LLE Weight Bearing: Weight bearing as tolerated       Mobility Bed Mobility Overal bed mobility: Needs Assistance Bed Mobility: Sit to Supine   Sidelying to sit: Max assist, +2 for physical assistance, HOB elevated             Transfers Overall transfer level: Needs assistance   Transfers: Sit to/from Stand, Bed to chair/wheelchair/BSC Sit to Stand: Mod assist Stand pivot transfers: Mod assist Squat pivot transfers: Mod assist             Balance Overall balance assessment: Needs assistance Sitting-balance support: Feet supported, Bilateral upper extremity supported Sitting balance-Leahy Scale: Poor   Postural control: Posterior lean Standing balance support: Bilateral upper extremity supported Standing balance-Leahy Scale: Poor                             ADL either performed or assessed with clinical judgement   ADL   Eating/Feeding: Maximal assistance;Sitting   Grooming: Wash/dry face;Maximal assistance;Sitting           Upper Body Dressing : Moderate assistance;Maximal assistance;Sitting   Lower Body Dressing: Total assistance;Bed level   Toilet Transfer: Moderate assistance;Stand-pivot;BSC/3in1   Toileting- Clothing Manipulation and Hygiene: Total assistance;Sit to/from stand              Extremity/Trunk Assessment Upper Extremity Assessment Upper Extremity Assessment: LUE deficits/detail LUE Deficits / Details: tends to hold in a flexed position LUE Coordination: decreased fine motor;decreased gross motor   Lower Extremity Assessment Lower Extremity Assessment: Defer to PT evaluation   Cervical / Trunk Assessment Cervical / Trunk Assessment: Kyphotic    Vision   Vision Assessment?: No apparent visual deficits   Perception Perception Perception: Not tested   Praxis Praxis Praxis: Not tested    Cognition Arousal/Alertness: Awake/alert Behavior During Therapy: Anxious, Flat affect Overall Cognitive Status: History of cognitive impairments - at baseline  Pertinent Vitals/ Pain       Pain Assessment Pain Assessment: Faces Faces Pain Scale: Hurts whole lot Pain  Location: L hip with movements (pt did not localize but guarding in this area) Pain Descriptors / Indicators: Grimacing, Guarding, Moaning, Crying Pain Intervention(s): Monitored during session                                                          Frequency  Min 1X/week        Progress Toward Goals  OT Goals(current goals can now be found in the care plan section)  Progress towards OT goals: Progressing toward goals  Acute Rehab OT Goals OT Goal Formulation: Patient unable to participate in goal setting Time For Goal Achievement: 03/16/23 Potential to Achieve Goals: Fair  Plan Discharge plan remains appropriate;Frequency remains appropriate    Co-evaluation    PT/OT/SLP Co-Evaluation/Treatment: Yes Reason for Co-Treatment: Complexity of the patient's impairments (multi-system involvement);For patient/therapist safety   OT goals addressed during session: ADL's and self-care      AM-PAC OT "6 Clicks" Daily Activity     Outcome Measure   Help from another person eating meals?: A Lot Help from another person taking care of personal grooming?: A Lot Help from another person toileting, which includes using toliet, bedpan, or urinal?: A Lot Help from another person bathing (including washing, rinsing, drying)?: A Lot Help from another person to put on and taking off regular upper body clothing?: A Lot Help from another person to put on and taking off regular lower body clothing?: Total 6 Click Score: 11    End of Session Equipment Utilized During Treatment: Gait belt  OT Visit Diagnosis: Muscle weakness (generalized) (M62.81);Other symptoms and signs involving cognitive function;Pain Pain - Right/Left: Left Pain - part of body: Hip   Activity Tolerance Patient limited by pain   Patient Left in chair;with call bell/phone within reach;with chair alarm set   Nurse Communication Mobility status        Time: 8295-6213 OT Time Calculation  (min): 23 min  Charges: OT General Charges $OT Visit: 1 Visit OT Treatments $Self Care/Home Management : 8-22 mins  03/12/2023  RP, OTR/L  Acute Rehabilitation Services  Office:  [phone]   Suzanna Obey 03/12/2023, 12:50 PM

## 2023-03-12 NOTE — Progress Notes (Signed)
Contacted [facility] admissions to request an update on pt's referral/transfer request. Awaiting response. Case discussed with CSW as well. Will assist as needed.   Olivia Canter Renal Navigator [phone]

## 2023-03-12 NOTE — Progress Notes (Signed)
Patient ID: Madison [name], female   DOB: 1951-08-18, 72 y.o.   MRN: [id number] [facility] Progress Note   Assessment/ Plan:   1.  Status post hemiarthroplasty for left hip fracture: Postoperative and awaiting outpatient placement to skilled nursing facility along with OP orthopedic surgery follow up.  2. ESRD: On HD per a Monday/Wednesday/Friday schedule (previously [facility] in [place])  - tolerated HD in a recliner.   - HD per MWF schedule (getting in recliner)   3. Anemia CKD:  she has been on aranesp 60 mcg every week.  Her dose was retimed for today.  Will need to increase to 100 mcg every Tuesday for next dose. Monitor for need for transfusions.  With report of blood per rectum as below  4. CKD-MBD: hyperphosphatemia - on sevelamer.  Will need a reduced ca bath for now if able (unfortunately we are limited inpatient)   5. Hypertension: acceptable control   6. Bright red blood per rectum - Note that GI has been consulted.  No further bleeding in interim on today's GI note.  May have been hemorrhoids?   7.  Leukocytosis: ongoing evaluation for possible infection source with concomitant tachycardia.  Antibiotics per primary team discretion. Improved slightly    8.  Increased stool burden - s/p manual disimpaction on 6/17 per nursing.  Note CT suggests fecal impaction and possible proctitis. No obstruction.  Would continue efforts to disimpact and reduce stool burden.  Patient with some difficulty with PO meds.  Per primary team   9. Altered mental status  - Her baseline is still unclear to me - her sister states that she makes her medical decisions  Disposition - she is changing SNF's and will need a new outpatient dialysis unit.  (She is not able to be discharged without a new outpatient HD unit confirmed).  However note that at this time she is not acceptable for outpatient HD (she is altered which is hopefully reversible)   Subjective:    Last HD on  6/17 with zero mL UF.  She had no UOP over 6/17 (zero mL is charted).  Per nursing she was stuck several times for labs during the day - HD RN reports labs were sent down with HD however none were processed.  Hb did drop but thankfully Hb nadir was just 8.2.  [facility] was contacted for admission locally.  I spoke with her sisters at bedside.  One of her sisters is her Education officer, environmental.  We discussed that at this time it is difficult to accept her to a new outpatient unit - that her mental status would first need to improve before that would be a safe option.  The patient is one of 9 children; the patient's twin sister had CKD and elected not to pursue HD.  She has been on HD for several years.  Nursing has had trouble getting her meds to her today - they are in applesauce now.  Review of systems:   Unable to obtain secondary to AMS      Objective:   BP 120/63   Pulse (!) 108   Temp 98.5 F (36.9 C)   Resp 19   Ht 5\' 4"  (1.626 m)   Wt 48.3 kg   SpO2 95%   BMI 18.28 kg/m   Physical Exam:   General elderly female in bed in discomfort HEENT normocephalic atraumatic extraocular movements intact sclera anicteric Neck supple trachea midline Lungs clear to auscultation bilaterally normal work of breathing  at rest  Heart S1S2 no rub Abdomen soft nontender nondistended Extremities no edema  Neuro - patient with minimal verbal response to exam which is consistent with the 6/16 and 6/17 exams. She manages to say "I don't understand" but with dysarthria/difficult to discern Psych - agitated and at times seems fearful, in pain Access LUE AVF with thrill     Labs: BMET Recent Labs  Lab 03/06/23 0630 03/07/23 0251 03/08/23 0523 03/09/23 0158 03/10/23 0110 03/12/23 0105  NA 135 132* 129* 135 132* 135  K 4.7 4.5 5.3* 4.3 4.9 4.5  CL 92* 90* 88* 92* 89* 94*  CO2 24 26 24 27 26 24   GLUCOSE 83 103* 82 105* 103* 81  BUN 53* 36* 58* 34* 56* 27*  CREATININE 7.31* 4.73* 6.81* 4.34* 6.11* 3.55*   CALCIUM 9.9 9.8 10.1 10.0 10.1 10.4*  PHOS 7.0* 4.3 7.5* 4.7* 5.7* 4.4   CBC Recent Labs  Lab 03/09/23 0158 03/09/23 0836 03/10/23 0110 03/11/23 0445 03/12/23 0150  WBC 12.3* 11.8* 13.4* 21.3* 14.3*  NEUTROABS 9.1* 8.9* 10.3*  --  11.9*  HGB 9.3* 9.6* 9.0* 9.2* 8.2*  HCT 28.3* 29.6* 28.2* 28.4* 25.9*  MCV 90.1 90.8 89.5 90.7 89.3  PLT 276 289 297 323 297      Medications:     sodium chloride   Intravenous Once   acetaminophen  650 mg Oral TID   benztropine  0.5 mg Oral BID   Chlorhexidine Gluconate Cloth  6 each Topical Q0600   darbepoetin (ARANESP) injection - NON-DIALYSIS  60 mcg Subcutaneous Q Mon-1800   docusate sodium  100 mg Oral BID   feeding supplement  1 Container Oral TID BM   Glycerin (Adult)  1 suppository Rectal Once   metoprolol tartrate  12.5 mg Oral BID   mirtazapine  15 mg Oral QHS   multivitamin  1 tablet Oral QHS   mouth rinse  15 mL Mouth Rinse 4 times per day   pantoprazole (PROTONIX) IV  40 mg Intravenous Q12H   polyethylene glycol  17 g Oral BID   senna  1 tablet Oral BID   sevelamer carbonate  1,600 mg Oral TID WC   valproic acid  250 mg Oral TID WC   Estanislado Emms, MD 03/12/2023, 3:15 PM

## 2023-03-12 NOTE — Progress Notes (Signed)
Physical Therapy Treatment Patient Details Name: Madison [name] MRN: [id number] DOB: August 22, 1951 Today's Date: 03/12/2023   History of Present Illness 72 y.o. female admitted 6/4 for fall and hip pain, s/p UNIPOLAR HEMIARTHROPLASTY OF THE LEFT HIP on 6/6. PMHx: history of ESRD, HTN, and schizophrenia versus bipolar disorder.    PT Comments    Patient received in bed, she appears anxious. Minimal verbalizations during session. Occasionally answering simple questions. She does not attempt to assist with bed mobility. Cries out with minimal movement of L LE. She is holding the L LE internally rotated at rest. Patient required +2 max assist for bed mobility. She is able to sit with min A support to back for balance. Patient requires +2 mod assist to stand and max +2 assist to pivot to recliner. Yelling out in pain throughout movement. Patient will continue to benefit from skilled PT to improve safety and independence.     Recommendations for follow up therapy are one component of a multi-disciplinary discharge planning process, led by the attending physician.  Recommendations may be updated based on patient status, additional functional criteria and insurance authorization.  Follow Up Recommendations  Can patient physically be transported by private vehicle: No    Assistance Recommended at Discharge Intermittent Supervision/Assistance  Patient can return home with the following Two people to help with walking and/or transfers;Two people to help with bathing/dressing/bathroom;Assistance with cooking/housework;Assist for transportation;Help with stairs or ramp for entrance   Equipment Recommendations  None recommended by PT    Recommendations for Other Services       Precautions / Restrictions Precautions Precautions: Posterior Hip Precaution Comments: Unable to understand due to poor cognition Required Braces or Orthoses: Other Brace Other Brace: wedge cushion Restrictions Weight Bearing  Restrictions: Yes LLE Weight Bearing: Weight bearing as tolerated     Mobility  Bed Mobility Overal bed mobility: Needs Assistance Bed Mobility: Supine to Sit   Sidelying to sit: Max assist, HOB elevated, +2 for physical assistance Supine to sit: Max assist, +2 for physical assistance, HOB elevated          Transfers Overall transfer level: Needs assistance Equipment used: Rolling walker (2 wheels), 2 person hand held assist Transfers: Sit to/from Stand, Bed to chair/wheelchair/BSC Sit to Stand: Mod assist, +2 physical assistance Stand pivot transfers: Mod assist, +2 physical assistance Step pivot transfers: +2 physical assistance, Max assist, Total assist       General transfer comment: Patient able to stand with Mod+2 assist and step pivot with max +2 assist to recliner. Then able to stand again with RW and mod +2 assist to get cleaned up and new chair pad under her.    Ambulation/Gait               General Gait Details: pt unable to safely attempt due to instability/ crying out in pain with transfer   Stairs             Wheelchair Mobility    Modified Rankin (Stroke Patients Only)       Balance Overall balance assessment: Needs assistance Sitting-balance support: Feet supported Sitting balance-Leahy Scale: Poor Sitting balance - Comments: external support +1 for upright sitting edge of bed Postural control: Posterior lean Standing balance support: Bilateral upper extremity supported, During functional activity, Reliant on assistive device for balance Standing balance-Leahy Scale: Poor Standing balance comment: +2 modA progressing to maxA +2 for static/dynamic standing tasks  Cognition Arousal/Alertness: Awake/alert Behavior During Therapy: Anxious, Flat affect Overall Cognitive Status: History of cognitive impairments - at baseline                                          Exercises       General Comments        Pertinent Vitals/Pain Pain Assessment Pain Assessment: PAINAD Faces Pain Scale: Hurts whole lot Breathing: normal Negative Vocalization: occasional moan/groan, low speech, negative/disapproving quality Facial Expression: sad, frightened, frown Body Language: relaxed Consolability: distracted or reassured by voice/touch PAINAD Score: 3 Pain Location: L hip with movements (pt did not localize but guarding in this area) Pain Descriptors / Indicators: Grimacing, Guarding, Moaning, Crying Pain Intervention(s): Monitored during session, Repositioned, Utilized relaxation techniques    Home Living                          Prior Function            PT Goals (current goals can now be found in the care plan section) Acute Rehab PT Goals Patient Stated Goal: none stated PT Goal Formulation: With family Time For Goal Achievement: 03/15/23 Progress towards PT goals: Not progressing toward goals - comment (patient unable to assist with mobility. continues to be pain limited/fearful)    Frequency    Min 2X/week      PT Plan Current plan remains appropriate    Co-evaluation PT/OT/SLP Co-Evaluation/Treatment: Yes Reason for Co-Treatment: Necessary to address cognition/behavior during functional activity;For patient/therapist safety;To address functional/ADL transfers PT goals addressed during session: Mobility/safety with mobility;Balance;Proper use of DME OT goals addressed during session: ADL's and self-care      AM-PAC PT "6 Clicks" Mobility   Outcome Measure  Help needed turning from your back to your side while in a flat bed without using bedrails?: Total Help needed moving from lying on your back to sitting on the side of a flat bed without using bedrails?: Total Help needed moving to and from a bed to a chair (including a wheelchair)?: Total Help needed standing up from a chair using your arms (e.g., wheelchair or bedside chair)?:  Total Help needed to walk in hospital room?: Total Help needed climbing 3-5 steps with a railing? : Total 6 Click Score: 6    End of Session Equipment Utilized During Treatment: Gait belt Activity Tolerance: Patient limited by pain Patient left: in chair;with call bell/phone within reach;with chair alarm set Nurse Communication: Mobility status;Other (comment) (how to safely pivot her back to bed.) PT Visit Diagnosis: Muscle weakness (generalized) (M62.81);History of falling (Z91.81);Difficulty in walking, not elsewhere classified (R26.2);Pain Pain - Right/Left: Left Pain - part of body: Hip;Leg     Time: 4098-1191 PT Time Calculation (min) (ACUTE ONLY): 15 min  Charges:  $Therapeutic Activity: 8-22 mins                     Saahir Prude, PT, GCS 03/12/23,1:01 PM

## 2023-03-12 NOTE — TOC Progression Note (Signed)
Transition of Care [facility]) - Progression Note    Patient Details  Name: Madison [name] MRN: [id number] Date of Birth: 1951/08/19  [facility] CM/SW Contact  Lorri Frederick, LCSW Phone Number: 03/12/2023, 10:13 AM  Clinical Narrative:    No DC today, per MD.  CSW spoke with pt sister/guardian Meriam Sprague, updated her that no DC today.  Confirmed again plan for DC to [facility].      Expected Discharge Plan: Skilled Nursing Facility Barriers to Discharge: Continued Medical Work up, SNF Pending bed offer  Expected Discharge Plan and Services In-house Referral: Clinical Social Work   Post Acute Care Choice: Skilled Nursing Facility Living arrangements for the past 2 months: Skilled Nursing Facility ([facility])                                       Social Determinants of Health (SDOH) Interventions SDOH Screenings   Alcohol Screen: Low Risk  (12/25/2018)  Tobacco Use: Low Risk  (03/04/2023)    Readmission Risk Interventions    12/27/2022    8:27 AM  Readmission Risk Prevention Plan  Transportation Screening Complete  HRI or Home Care Consult Complete  Social Work Consult for Recovery Care Planning/Counseling Complete  Palliative Care Screening Not Applicable  Medication Review [employer]) Complete

## 2023-03-12 NOTE — Progress Notes (Signed)
Nutrition Follow-up  DOCUMENTATION CODES:   Non-severe (moderate) malnutrition in context of chronic illness, Underweight  INTERVENTION:  Consider SLP evaluation of swallow safety d/t noted lethargy Change Ensure to Boost Breeze po TID, each supplement provides 250 kcal and 9 grams of protein while on clear liquid diet Discussed recommendation for supplemental nutrition given malnutrition and inadequate PO intake  NUTRITION DIAGNOSIS:   Moderate Malnutrition related to chronic illness (ESRD, schizoaffective disorder) as evidenced by moderate fat depletion, severe fat depletion, moderate muscle depletion. - remains applicable  GOAL:   Patient will meet greater than or equal to 90% of their needs - goal unmet  MONITOR:   PO intake, Supplement acceptance, Diet advancement, Labs, Weight trends, I & O's, Skin  REASON FOR ASSESSMENT:   Consult Hip fracture protocol  ASSESSMENT:   Pt admitted from SNF after multiple falls leading to L femoral fracture. PMH significant for ESRD on HD, HTN and schizoaffective disorder, bipolar type.  6/6 - s/p unipolar hemiarthroplasty of L hip 6/17 - diet downgraded to clears  Pt now noted to have hematochezia. GI consulted and suspect bleeding d/t stercoral colitis/ulcer, hemorrhoidal bleed and/or diverticular bleed.  Meal completions remain poor. Pt now on clear liquid diet. Noted nursing documentation of pt being too lethargic and unable to swallow.   Reached out to MD to discuss consideration of alternate nutrition access for supplemental nutrition given ongoing inadequate PO intake.   Last HD 6/17 no fluid removal.  No post HD weights documented d/t pt in recliner. Last documented weight was 48.3 kg on 6/11.  Medications: colace, remeron, rena-vit, protonix, miralax, senna, renvela  Labs: BUN 27, Cr 3.55, Ca 10.4, anion gap 17, GFR 13, Hgb 8.2  Diet Order:   Diet Order             Diet clear liquid Room service appropriate? Yes;  Fluid consistency: Thin  Diet effective now                   EDUCATION NEEDS:   Education needs have been addressed  Skin:  Skin Assessment: Reviewed RN Assessment  Last BM:  6/17 (type 4 small)  Height:   Ht Readings from Last 1 Encounters:  02/28/23 5\' 4"  (1.626 m)    Weight:   Wt Readings from Last 1 Encounters:  12/31/22 49.7 kg    Ideal Body Weight:  54.5 kg  BMI:  Body mass index is 18.28 kg/m.  Estimated Nutritional Needs:   Kcal:  1500-1700  Protein:  75-85g  Fluid:  1L + UOP  Drusilla Kanner, RDN, LDN Clinical Nutrition

## 2023-03-12 NOTE — Progress Notes (Addendum)
    Progress Note   Subjective  Chief Complaint: Rectal bleeding, constipation  Today, patient is found resting comfortably.  Per nursing staff she passed another soft stool yesterday with no further bleeding per them.  They are nervous to feed her given that she has not had speech/swallow eval.  She has not had any MiraLAX.   Objective   Vital signs in last 24 hours: Temp:  [97.4 F (36.3 C)-98.9 F (37.2 C)] 98.5 F (36.9 C) (06/18 0700) Pulse Rate:  [95-112] 108 (06/18 1108) Resp:  [15-26] 19 (06/18 0700) BP: (85-120)/(49-70) 120/63 (06/18 1108) SpO2:  [95 %-100 %] 95 % (06/18 0400) Last BM Date : 03/12/23 General:    Chronically ill-appearing, thin, African-American female in NAD, + vocalizations Heart:  Regular rate and rhythm; no murmurs Lungs: Respirations even and unlabored, lungs CTA bilaterally Abdomen:  Soft, nontender and nondistended. Normal bowel sounds. Psych:  Cooperative. Normal mood and affect.  Intake/Output from previous day: 06/17 0701 - 06/18 0700 In: 50 [IV Piggyback:50] Out: 1 [Stool:1]   Lab Results: Recent Labs    03/10/23 0110 03/11/23 0445 03/12/23 0150  WBC 13.4* 21.3* 14.3*  HGB 9.0* 9.2* 8.2*  HCT 28.2* 28.4* 25.9*  PLT 297 323 297   BMET Recent Labs    03/10/23 0110 03/12/23 0105  NA 132* 135  K 4.9 4.5  CL 89* 94*  CO2 26 24  GLUCOSE 103* 81  BUN 56* 27*  CREATININE 6.11* 3.55*  CALCIUM 10.1 10.4*   LFT Recent Labs    03/12/23 0105  PROT 7.0  ALBUMIN 2.8*  AST 19  ALT 7  ALKPHOS 54  BILITOT 0.8    Assessment / Plan:   Assessment: 1.  Hematochezia: Initially seen overnight on 03/11/2023, the next day she was disimpacted with a large ball of hard stool removed from the rectum, CT pending, hemoglobin mostly stable; thought to likely represent stercoral colitis 2.  Left femoral neck fracture 3.  Schizoaffective disorder/bipolar type with uncontrollable agitation intermittently 4.  ESRD on HD Monday Wednesday  Friday 5.  Sinus tachycardia 6.  Leukocytosis 7.  Anemia of chronic disease compounded by postoperative anemia 8.  Malnutrition  Plan: 1.  Hemoglobin did drop slightly, but no further bleeding per nursing staff 2.  Await results from CT which are pending, but most likely this represents hemorrhoids versus stercoral colitis given ball of stool found initially 3.  If CT is normal and patient has no further episodes of bleeding we will likely sign off  Thank you for your kind consultation.    LOS: 14 days   Unk Lightning  03/12/2023, 11:21 AM   Addendum: Notified by nursing staff that CT shows a lot of stool- cleared for regular diet by speech- ordered half dose movi prep for this afternoon.  JLL

## 2023-03-12 NOTE — Progress Notes (Signed)
Milk and molasses enema administered.  Large Bowel movement evacuated.

## 2023-03-13 ENCOUNTER — Inpatient Hospital Stay (HOSPITAL_COMMUNITY): Payer: Medicare Other

## 2023-03-13 DIAGNOSIS — D72829 Elevated white blood cell count, unspecified: Secondary | ICD-10-CM

## 2023-03-13 DIAGNOSIS — K625 Hemorrhage of anus and rectum: Secondary | ICD-10-CM

## 2023-03-13 DIAGNOSIS — K219 Gastro-esophageal reflux disease without esophagitis: Secondary | ICD-10-CM

## 2023-03-13 DIAGNOSIS — J189 Pneumonia, unspecified organism: Secondary | ICD-10-CM

## 2023-03-13 DIAGNOSIS — D631 Anemia in chronic kidney disease: Secondary | ICD-10-CM

## 2023-03-13 DIAGNOSIS — K921 Melena: Secondary | ICD-10-CM

## 2023-03-13 DIAGNOSIS — N186 End stage renal disease: Secondary | ICD-10-CM | POA: Diagnosis not present

## 2023-03-13 DIAGNOSIS — K5900 Constipation, unspecified: Secondary | ICD-10-CM

## 2023-03-13 DIAGNOSIS — K922 Gastrointestinal hemorrhage, unspecified: Secondary | ICD-10-CM | POA: Diagnosis not present

## 2023-03-13 DIAGNOSIS — S72002D Fracture of unspecified part of neck of left femur, subsequent encounter for closed fracture with routine healing: Secondary | ICD-10-CM | POA: Diagnosis not present

## 2023-03-13 DIAGNOSIS — R000 Tachycardia, unspecified: Secondary | ICD-10-CM

## 2023-03-13 DIAGNOSIS — Z992 Dependence on renal dialysis: Secondary | ICD-10-CM | POA: Diagnosis not present

## 2023-03-13 LAB — CBC WITH DIFFERENTIAL/PLATELET
Abs Immature Granulocytes: 0.12 10*3/uL — ABNORMAL HIGH (ref 0.00–0.07)
Basophils Absolute: 0.1 10*3/uL (ref 0.0–0.1)
Basophils Relative: 0 %
Eosinophils Absolute: 0.2 10*3/uL (ref 0.0–0.5)
Eosinophils Relative: 1 %
HCT: 23.8 % — ABNORMAL LOW (ref 36.0–46.0)
Hemoglobin: 7.7 g/dL — ABNORMAL LOW (ref 12.0–15.0)
Immature Granulocytes: 1 %
Lymphocytes Relative: 10 %
Lymphs Abs: 1.4 10*3/uL (ref 0.7–4.0)
MCH: 29.5 pg (ref 26.0–34.0)
MCHC: 32.4 g/dL (ref 30.0–36.0)
MCV: 91.2 fL (ref 80.0–100.0)
Monocytes Absolute: 1.3 10*3/uL — ABNORMAL HIGH (ref 0.1–1.0)
Monocytes Relative: 8 %
Neutro Abs: 11.9 10*3/uL — ABNORMAL HIGH (ref 1.7–7.7)
Neutrophils Relative %: 80 %
Platelets: 367 10*3/uL (ref 150–400)
RBC: 2.61 MIL/uL — ABNORMAL LOW (ref 3.87–5.11)
RDW: 19.5 % — ABNORMAL HIGH (ref 11.5–15.5)
WBC: 14.9 10*3/uL — ABNORMAL HIGH (ref 4.0–10.5)
nRBC: 0 % (ref 0.0–0.2)

## 2023-03-13 LAB — COMPREHENSIVE METABOLIC PANEL
ALT: 10 U/L (ref 0–44)
AST: 21 U/L (ref 15–41)
Albumin: 2.5 g/dL — ABNORMAL LOW (ref 3.5–5.0)
Alkaline Phosphatase: 61 U/L (ref 38–126)
Anion gap: 15 (ref 5–15)
BUN: 50 mg/dL — ABNORMAL HIGH (ref 8–23)
CO2: 26 mmol/L (ref 22–32)
Calcium: 10.5 mg/dL — ABNORMAL HIGH (ref 8.9–10.3)
Chloride: 92 mmol/L — ABNORMAL LOW (ref 98–111)
Creatinine, Ser: 5.21 mg/dL — ABNORMAL HIGH (ref 0.44–1.00)
GFR, Estimated: 8 mL/min — ABNORMAL LOW (ref >60)
Glucose, Bld: 98 mg/dL (ref 70–99)
Potassium: 4.7 mmol/L (ref 3.5–5.1)
Sodium: 133 mmol/L — ABNORMAL LOW (ref 135–145)
Total Bilirubin: 0.5 mg/dL (ref 0.3–1.2)
Total Protein: 6.7 g/dL (ref 6.5–8.1)

## 2023-03-13 LAB — CULTURE, BLOOD (ROUTINE X 2)

## 2023-03-13 LAB — MAGNESIUM: Magnesium: 1.9 mg/dL (ref 1.7–2.4)

## 2023-03-13 LAB — PHOSPHORUS: Phosphorus: 4.4 mg/dL (ref 2.5–4.6)

## 2023-03-13 MED ORDER — ARIPIPRAZOLE 5 MG PO TABS
15.0000 mg | ORAL_TABLET | Freq: Every day | ORAL | Status: DC
  Administered 2023-03-13 – 2023-03-23 (×9): 15 mg via ORAL
  Filled 2023-03-13 (×11): qty 1

## 2023-03-13 MED ORDER — SENNOSIDES-DOCUSATE SODIUM 8.6-50 MG PO TABS
1.0000 | ORAL_TABLET | Freq: Two times a day (BID) | ORAL | Status: DC
  Administered 2023-03-16 – 2023-03-23 (×12): 1 via ORAL
  Filled 2023-03-13 (×19): qty 1

## 2023-03-13 MED ORDER — PIPERACILLIN-TAZOBACTAM 3.375 G IVPB: 3.3750 g | Freq: Three times a day (TID) | INTRAVENOUS | Status: DC

## 2023-03-13 MED ORDER — MILK AND MOLASSES ENEMA
1.0000 | Freq: Once | RECTAL | Status: AC
  Administered 2023-03-13: 240 mL via RECTAL
  Filled 2023-03-13: qty 240

## 2023-03-13 MED ORDER — MIDODRINE HCL 5 MG PO TABS
5.0000 mg | ORAL_TABLET | Freq: Three times a day (TID) | ORAL | Status: DC
  Administered 2023-03-13: 5 mg via ORAL
  Filled 2023-03-13: qty 1

## 2023-03-13 MED ORDER — PIPERACILLIN-TAZOBACTAM IN DEX 2-0.25 GM/50ML IV SOLN
2.2500 g | Freq: Four times a day (QID) | INTRAVENOUS | Status: DC
  Administered 2023-03-13 – 2023-03-14 (×3): 2.25 g via INTRAVENOUS
  Filled 2023-03-13 (×4): qty 50

## 2023-03-13 NOTE — Progress Notes (Signed)
   03/13/23 1322  Vitals  Temp (!) 96.7 F (35.9 C)  Pulse Rate (!) 105  Resp (!) 22  BP 97/61  SpO2 95 %  O2 Device Room Air  Type of Weight Post-Dialysis  Oxygen Therapy  Patient Activity (if Appropriate) In bed  Pulse Oximetry Type Continuous  Oximetry Probe Site Changed No  Post Treatment  Dialyzer Clearance Lightly streaked  Duration of HD Treatment -hour(s) 3.13 hour(s)  Hemodialysis Intake (mL) 0 mL  Liters Processed 74.6  Fluid Removed (mL) 0 mL  Tolerated HD Treatment No (Comment)  Post-Hemodialysis Comments see progress notes  AVG/AVF Arterial Site Held (minutes) 10 minutes  AVG/AVF Venous Site Held (minutes) 10 minutes   Received patient in bed to unit.  Alert and oriented.  Informed consent signed and in chart.   TX duration:3.13  Patient tolerated well.  Transported back to the room  Alert, without acute distress.  Hand-off given to patient's nurse.   Access used: LUAF Access issues:see progress notes---dr foster is aware  Total UF removed: 0 Medication(s) given: none   [facility]

## 2023-03-13 NOTE — Progress Notes (Signed)
   03/13/23 1030  During Treatment Monitoring  Intra-Hemodialysis Comments  (tx still paused catridge clotted new cartridge priming)

## 2023-03-13 NOTE — Progress Notes (Signed)
PROGRESS NOTE    Madison [name]  ZOX:[id number] DOB: [birth year] DOA: 02/26/2023 PCP: Charlynne Pander, MD    No chief complaint on file.   Brief Narrative:  The patient is a 72 year old SNF resident with past medical history significant for but limited to ESRD on hemodialysis Monday Wednesday Friday, hypertension, history of schizophrenia versus bipolar disorder as well as other comorbidities who presented to the [facility] ER after a fall which occurred multiple days prior.  X-rays at [facility] ER revealed a left femoral fracture and she was transferred to [facility] given that she needed a combination of the orthopedist as well as inpatient dialysis.  She underwent unipolar Hemi arthroplasty of the left hip on 02/28/2023 and has been deemed stable from a orthopedic standpoint.  She had issues with her dialysis catheter which has not been fixed and she is getting dialysis.  She was extremely agitated and hysterically crying early on during the hospitalization and remained emotionally labile and tearful however was calmer.   We were anticipating discharging back to SNF with resumption of her normal dialysis session on Friday, 03/08/2023 however a barrier to discharge was that the patient will need to be able to tolerate sitting in a chair for 4 to 5 hours to be appropriate for outpatient HD for discharge.  She was able to dialyze in the chair early on during the hospitalization, appropriately and had no issues with that but heart rate remained elevated in the setting of pain and agitation so she was initiated on a beta-blocker and given some pain medication.    Heart rate was improved however she now has a slight leukocytosis and after repeat is trending down.  TOC attempted to call the facility and got an response so unfortunately could not discharge given that no one at the bedside picked up.  Case worker is informed that the patient's legal guardian was unhappy with the SNF choice and requested to change her  SNF placement to [facility].  [facility] is able to take the patient at their facility  however because of the symptoms changed her dialysis center will need to be moved.     CBC continue to further worsen as she had bright red blood per rectum and was found to be impacted.  Having large stool ball and her bowel regimen has been escalated and obtained CT scan.  GI was consulted given her hemoglobin and bleeding and has bleeding noted to be subsiding, GI not recommending endoscopic procedures at this time.  GI feels patient bleeding likely secondary to inflammation in the rectum due to stercoral colitis, and the meds recommended for patient's constipation a few no further additional bleeding at this time..  Will need to continue monitor carefully.     Assessment & Plan:   Active Problems:   ESRD on hemodialysis (HCC)   Schizoaffective disorder, bipolar type (HCC)   Hip fracture, left (HCC)   Malnutrition of moderate degree   Hematochezia   Constipation   Gastroesophageal reflux disease   BRBPR (bright red blood per rectum)   Leukocytosis   Sinus tachycardia   Anemia due to chronic kidney disease, on chronic dialysis (HCC)  #1 left femoral neck fracture -Secondary to mechanical fall. -Patient seen in consultation by orthopedics underwent unipolar hemiarthroplasty of the left hip 02/28/2023 without any significant complications. -Continue current pain regimen of oxycodone 5 to 10 mg every 4 hours as needed moderate pain, acetaminophen 650 mg 3 times daily, fentanyl 12.5 mg every 2  hours as needed for severe pain. -Orthopedics recommending WBAT left lower extremity with assistance and posterior hip precautions for 12 weeks with daily wound care as needed and DVT prophylaxis with subcu heparin x 28 days. -Continue current bowel regimen. -Outpatient follow-up with orthopedic surgery 10 days postdischarge.  2.  Schizoaffective disorder/bipolar type with uncontrollable crying and agitation  intermittently -Resume home regimen Abilify.   -Continue home regimen Depakote, Cogentin. -Patient noted to have been extremely agitated and anxious a few days ago however somewhat drowsy while on hemodialysis today. -RN concerned about patient's inability to swallow, SLP evaluated patient patient cleared for regular diet. -Continue mirtazapine 15 mg nightly. -Haldol as needed. -If still remains agitated will need to consult with psychiatry for further evaluation and management.  3.  ESRD on HD Monday Wednesday Friday/hyperphosphatemia improved with dialysis/elevated anion gap -Patient noted with ongoing issues with dialysis cannulation which has been corrected post ultrasound-guided access of left upper extremity fistula for balloon angioplasty of outflow stenosis at the axillary vein with no residual on completion per IR 03/04/2023.. -Continue sevelamer 1600 mg 3 times daily, avoid nephrotoxic medications, contrast dyes, hypotension and dehydration to ensure adequate renal perfusion.  -Nephrology following and feel patient needs to be dialyzed in the chair prior to being discharged and these improved mental status prior to being set up for outpatient dialysis.  -Per nephrology.  4.  Sinus tachycardia -Noted in the setting of GI bleed constipation. -CT abdomen and pelvis with concerns for constipation, rectal wall thickening, perirectal stranding, fecal impaction. -Currently on beta-blocker which we will continue for now.  5.  Constipation and fecal impaction -CT abdomen and pelvis done with large amount of stool in the rectum, wall thickening in the rectum perirectal stranding, findings suggesting fecal impaction and possible proctitis. -No evidence of intestinal obstruction or pneumoperitoneum, no hydronephrosis. -Patient seen in consultation by GI. -Patient received milk of molasses enema with good results yesterday and another milk of molasses enema being ordered by the nephrology  team. -Continue current bowel regimen of MiraLAX twice daily, Senokot-S twice daily.  6.??  Pneumonia -CT scan done showed small new patchy infiltrates in both lower lower lung fields suggest atelectasis/pneumonia.  Small bilateral pleural effusions more on the left.  Small pericardial effusion, coronary artery disease. -Patient noted to be drowsy not awake enough to use a flutter valve incentive spirometry. -SLP assessed patient placed on a regular diet. -Patient with a leukocytosis white count of 14.9, repeat chest x-ray done 03/13/2023 with lower lung volumes with increasing patchy left basilar airspace disease, suspicious for pneumonia.  Small bilateral effusions. -Due to patient's drowsiness we will place empirically on IV Zosyn to complete a course of antibiotic treatment.  7.  Leukocytosis in the setting of BRBPR -Initially felt to be reactive but trended up and now in the setting of likely GI bleed and constipation concerns for stercoral colitis. -Repeat chest x-ray done this morning with increasing infiltrate concerning for pneumonia. -Blood cultures ordered and pending with no growth to date x 3 days. -CT abdomen and pelvis done with large amount of stool in the rectum, wall thickening in the rectum and perirectal stranding findings suggestive fecal impaction and possible proctitis.  No evidence of intestinal obstruction or pneumoperitoneum.  No hydronephrosis. -Placed empirically on IV Zosyn.  8.  Anemia of chronic kidney disease/postop anemia/BRBPR and concern for GI bleed (Likely stercoral colitis) -Patient with no significant further bleeding. -Patient seen in consultation by GI who recommended adjusting bowel regimen and continue  to monitor hemoglobin. -Patient with no known or significant bloody bowel movements. -Hemoglobin is currently at 7.7. -Patient receiving Darbepoetin alfa 60 mcg q. Mondays -Follow H&H, transfusion threshold hemoglobin < 7.   9.  Nonsevere protein  calorie malnutrition, moderate -Secondary to chronic illnesses (ESRD, schizoaffective disorder) as evidenced by moderate fat depletion depletion. -Dietitian following, may need to engage other forms of nutrition if patient continues to refuse food and may consider consultation with palliative care.  10.  GERD -PPI.  11.  Hypoalbuminemia - Follow.   DVT prophylaxis: SCDs Code Status: Full Family Communication: Updated patient.  No family at bedside. Disposition: TBD.  Likely needs SNF when medically stable, and able to tolerate outpatient hemodialysis.  Status is: Inpatient Remains inpatient appropriate because: Severity of illness   Consultants:  Nephrology: Dr. Ronalee Belts 02/27/2023 Orthopedics: Dr. Carola Frost 02/28/2023 Interventional radiology: Dr. Loreta Ave 03/04/2023 Gastroenterology: Dr. Leonides Schanz 03/11/2023  Procedures:  CT abdomen pelvis 03/12/2023 Chest x-ray 02/26/2023, 03/10/2023, 03/13/2023 Abdominal x-rays 03/13/2023 Ultrasound-guided access left upper extremity fistula for balloon angioplasty of the outflow stenosis at the axillary vein with no residual on completion Through maintained: Per IR Dr. Loreta Ave 03/04/2023 Plain films of the left hip 02/28/2023 Unipolar hemiarthroplasty on the left hip: Per orthopedics: Dr. Carola Frost 02/28/2023  Antimicrobials:  Anti-infectives (From admission, onward)    Start     Dose/Rate Route Frequency Ordered Stop   02/28/23 1430  ceFAZolin (ANCEF) IVPB 2g/100 mL premix  Status:  Discontinued        2 g 200 mL/hr over 30 Minutes Intravenous Every 6 hours 02/28/23 1334 02/28/23 1344   02/28/23 1147  vancomycin (VANCOCIN) powder  Status:  Discontinued          As needed 02/28/23 1147 02/28/23 1216   02/28/23 0830  ceFAZolin (ANCEF) IVPB 2g/100 mL premix        2 g 200 mL/hr over 30 Minutes Intravenous On call to O.R. 02/28/23 0731 02/28/23 1033   02/28/23 0830  vancomycin (VANCOCIN) IVPB 1000 mg/200 mL premix        1,000 mg 200 mL/hr over 60 Minutes  Intravenous On call to O.R. 02/28/23 4098 02/28/23 0858         Subjective: Currently HD.  Sleeping opens eyes to verbal stimuli drifts back off to sleep.  Objective: Vitals:   03/13/23 1322 03/13/23 1400 03/13/23 1414 03/13/23 1444  BP: 97/61  (!) 92/49 (!) 88/54  Pulse: (!) 105 (!) 104 (!) 102 (!) 101  Resp: (!) 22 (!) 21 19 20   Temp: (!) 96.7 F (35.9 C)     TempSrc:      SpO2: 95% 95% 96% 95%  Weight:      Height:        Intake/Output Summary (Last 24 hours) at 03/13/2023 1556 Last data filed at 03/13/2023 1322 Gross per 24 hour  Intake 25 ml  Output 0 ml  Net 25 ml   Filed Weights   03/05/23 0426 03/13/23 0820  Weight: 48.3 kg 48.3 kg    Examination:  General exam: Nausea/sleeping in HD Respiratory system: Clear to auscultation anterior lung fields.Marland Kitchen Respiratory effort normal. Cardiovascular system: Tachycardia. No JVD, murmurs, rubs, gallops or clicks. No pedal edema. Gastrointestinal system: Abdomen is nondistended, soft and nontender. No organomegaly or masses felt. Normal bowel sounds heard. Central nervous system: Drowsy.  In HD.  Moving extremities spontaneously. No focal neurological deficits. Extremities: Symmetric 5 x 5 power. Skin: No rashes, lesions or ulcers Psychiatry: Judgement and insight unable to assess.  Mood and affect unable to assess.      Data Reviewed: I have personally reviewed following labs and imaging studies  CBC: Recent Labs  Lab 03/09/23 0158 03/09/23 0836 03/10/23 0110 03/11/23 0445 03/12/23 0150 03/13/23 0127  WBC 12.3* 11.8* 13.4* 21.3* 14.3* 14.9*  NEUTROABS 9.1* 8.9* 10.3*  --  11.9* 11.9*  HGB 9.3* 9.6* 9.0* 9.2* 8.2* 7.7*  HCT 28.3* 29.6* 28.2* 28.4* 25.9* 23.8*  MCV 90.1 90.8 89.5 90.7 89.3 91.2  PLT 276 289 297 323 297 367    Basic Metabolic Panel: Recent Labs  Lab 03/08/23 0523 03/09/23 0158 03/10/23 0110 03/12/23 0105 03/13/23 0127  NA 129* 135 132* 135 133*  K 5.3* 4.3 4.9 4.5 4.7  CL 88* 92* 89*  94* 92*  CO2 24 27 26 24 26   GLUCOSE 82 105* 103* 81 98  BUN 58* 34* 56* 27* 50*  CREATININE 6.81* 4.34* 6.11* 3.55* 5.21*  CALCIUM 10.1 10.0 10.1 10.4* 10.5*  MG 1.8 1.8 1.8 1.7 1.9  PHOS 7.5* 4.7* 5.7* 4.4 4.4    GFR: Estimated Creatinine Clearance: 7.3 mL/min (A) (by C-G formula based on SCr of 5.21 mg/dL (H)).  Liver Function Tests: Recent Labs  Lab 03/08/23 0523 03/09/23 0158 03/10/23 0110 03/12/23 0105 03/13/23 0127  AST 20 21 21 19 21   ALT 5 6 6 7 10   ALKPHOS 62 65 58 54 61  BILITOT 0.6 0.6 0.6 0.8 0.5  PROT 6.6 6.6 6.5 7.0 6.7  ALBUMIN 2.5* 2.4* 2.3* 2.8* 2.5*    CBG: No results for input(s): "GLUCAP" in the last 168 hours.   Recent Results (from the past 240 hour(s))  Culture, blood (Routine X 2) w Reflex to ID Panel     Status: None (Preliminary result)   Collection Time: 03/10/23  9:51 AM   Specimen: BLOOD  Result Value Ref Range Status   Specimen Description BLOOD SITE NOT SPECIFIED  Final   Special Requests   Final    BOTTLES DRAWN AEROBIC AND ANAEROBIC Blood Culture results may not be optimal due to an inadequate volume of blood received in culture bottles   Culture   Final    NO GROWTH 3 DAYS Performed at [facility], [address] [place], [place] [address]    Report Status PENDING  Incomplete  Culture, blood (Routine X 2) w Reflex to ID Panel     Status: None (Preliminary result)   Collection Time: 03/10/23  9:51 AM   Specimen: BLOOD  Result Value Ref Range Status   Specimen Description BLOOD SITE NOT SPECIFIED  Final   Special Requests   Final    AEROBIC BOTTLE ONLY Blood Culture results may not be optimal due to an inadequate volume of blood received in culture bottles   Culture   Final    NO GROWTH 3 DAYS Performed at [facility], [address] [place], [place] [address]    Report Status PENDING  Incomplete         Radiology Studies: DG Abd 1 View  Result Date: 03/13/2023 CLINICAL DATA:  Pneumonia.   Constipation. EXAM: ABDOMEN - 1 VIEW COMPARISON:  Abdominal CT 03/12/2023. FINDINGS: 0620 hours. The bowel gas pattern is nonobstructive. There is mildly prominent stool in the distal colon including the rectum, slightly improved from recent CT. There are probable bowel anastomosis clips in the right mid abdomen as well as cholecystectomy clips. No suspicious abdominal calcifications. Thoracolumbar scoliosis and previous left hip arthroplasty noted. IMPRESSION:  Mildly prominent stool in the distal colon, slightly improved from recent CT. No evidence of bowel obstruction. Electronically Signed   By: Carey Bullocks M.D.   On: 03/13/2023 10:08   DG CHEST PORT 1 VIEW  Result Date: 03/13/2023 CLINICAL DATA:  Pneumonia. EXAM: PORTABLE CHEST 1 VIEW COMPARISON:  Radiographs 03/10/2023 and 02/26/2023. Abdominal CT 03/12/2023. FINDINGS: 0617 hours. The heart size and mediastinal contours are stable with mild cardiomegaly and aortic atherosclerosis. There are lower lung volumes with increasing patchy left basilar airspace disease. Small bilateral pleural effusions are present. No edema or pneumothorax. The bones appear unchanged. Telemetry leads overlie the chest. IMPRESSION: Lower lung volumes with increasing patchy left basilar airspace disease, suspicious for pneumonia. Small bilateral pleural effusions. Electronically Signed   By: Carey Bullocks M.D.   On: 03/13/2023 10:06   CT ABDOMEN PELVIS WO CONTRAST  Result Date: 03/12/2023 CLINICAL DATA:  Abdominal pain EXAM: CT ABDOMEN AND PELVIS WITHOUT CONTRAST TECHNIQUE: Multidetector CT imaging of the abdomen and pelvis was performed following the standard protocol without IV contrast. RADIATION DOSE REDUCTION: This exam was performed according to the departmental dose-optimization program which includes automated exposure control, adjustment of the mA and/or kV according to patient size and/or use of iterative reconstruction technique. COMPARISON:  12/26/2022  FINDINGS: Lower chest: Interval appearance of small bilateral pleural effusions, more so on the left side. Small patchy infiltrates are seen in the posterior lower lung fields suggesting atelectasis/pneumonia. Small pericardial effusion is present. There are scattered coronary artery calcifications. There are scattered calcifications in aorta. Hepatobiliary: Surgical clips are seen in gallbladder fossa. Pancreas: No focal abnormalities are seen. Spleen: Unremarkable. Adrenals/Urinary Tract: Adrenals are unremarkable. Right kidney is small in size. There is 2 mm calculus in the upper pole of right kidney. There are multiple smooth marginated low-density lesions in both kidneys largest measuring 4 cm in size in the left kidney suggesting renal cysts. Ureters are unremarkable. Urinary bladder is not distended. Stomach/Bowel: Stomach is unremarkable. Small bowel loops are not dilated. The appendix is not seen. There is no pericecal inflammation. There is no significant wall thickening in colon. Large amount of stool is seen in rectum. Transverse diameter of rectum measures 8 cm. Rays wall thickening in rectum. Knees perirectal stranding. Vascular/Lymphatic: Scattered arterial calcifications are seen in aorta and its major branches. Reproductive: Unremarkable. Other: There is no ascites or pneumoperitoneum. Musculoskeletal: Severe degenerative changes are noted in lumbar spine, most prominent at L1-L2, L3-L4 and L4-L5 levels. No significant interval changes are noted. There is left hip arthroplasty. IMPRESSION: There is large amount of stool in rectum. There is wall thickening in rectum and perirectal stranding. Findings suggest fecal impaction and possible proctitis. There is no evidence of intestinal obstruction or pneumoperitoneum. There is no hydronephrosis. There are small new patchy infiltrates in both lower lung fields suggesting atelectasis/pneumonia. Small bilateral pleural effusions, more so on the left side.  Small pericardial effusion. Coronary artery disease. Possible small 2 mm right renal stone. Multiple renal cysts. Severe lumbar spondylosis. Left hip arthroplasty. Aortic arteriosclerosis. Electronically Signed   By: Ernie Avena M.D.   On: 03/12/2023 11:53        Scheduled Meds:  sodium chloride   Intravenous Once   acetaminophen  650 mg Oral TID   ARIPiprazole  15 mg Oral Daily   benztropine  0.5 mg Oral BID   Chlorhexidine Gluconate Cloth  6 each Topical Q0600   Chlorhexidine Gluconate Cloth  6 each Topical Q0600   [START ON 03/19/2023]  darbepoetin (ARANESP) injection - NON-DIALYSIS  100 mcg Subcutaneous Q Tue-1800   docusate  100 mg Oral BID   feeding supplement  1 Container Oral TID BM   Glycerin (Adult)  1 suppository Rectal Once   metoprolol tartrate  12.5 mg Oral BID   midodrine  5 mg Oral TID WC   mirtazapine  15 mg Oral QHS   multivitamin  1 tablet Oral QHS   mouth rinse  15 mL Mouth Rinse 4 times per day   pantoprazole (PROTONIX) IV  40 mg Intravenous Q12H   polyethylene glycol  17 g Oral BID   senna-docusate  1 tablet Oral BID   sevelamer carbonate  1,600 mg Oral TID WC   valproic acid  250 mg Oral TID WC   Continuous Infusions:   LOS: 15 days    Time spent: 40 minutes    Ramiro Harvest, MD [facility]   To contact the attending provider between 7A-7P or the covering provider during after hours 7P-7A, please log into the web site [email or web address] and access using universal [place] password for that web site. If you do not have the password, please call the hospital operator.  03/13/2023, 3:56 PM

## 2023-03-13 NOTE — Progress Notes (Signed)
   03/13/23 1030  Vitals  BP (!) 100/51  MAP (mmHg) 66  BP Location Right Arm  BP Method Automatic  Patient Position (if appropriate) Lying  Pulse Rate (!) 111  Pulse Rate Source Monitor  ECG Heart Rate (!) 111  Resp (!) 30  Oxygen Therapy  SpO2 95 %  O2 Device Room Air  During Treatment Monitoring  Intra-Hemodialysis Comments  (tx still paused catridge clotted new cartridge priming)

## 2023-03-13 NOTE — Progress Notes (Signed)
Patient is clamping lips and refusing PO medications.

## 2023-03-13 NOTE — Progress Notes (Signed)
   03/13/23 1000  Vitals  BP (!) 100/55  MAP (mmHg) 68  BP Location Right Arm  BP Method Automatic  Patient Position (if appropriate) Lying  Pulse Rate (!) 105  Pulse Rate Source Monitor  ECG Heart Rate (!) 105  Resp (!) 21  Oxygen Therapy  SpO2 96 %  O2 Device Room Air  During Treatment Monitoring  Intra-Hemodialysis Comments  (tx paused blood returned to pt. tx paused due to pt high vp. venous cannulation check wouldnt push. pt venous recannulated.)

## 2023-03-13 NOTE — Progress Notes (Signed)
   03/13/23 1414  Assess: MEWS Score  BP (!) 92/49  MAP (mmHg) (!) 62  Pulse Rate (!) 102  ECG Heart Rate (!) 102  Resp 19  SpO2 96 %  O2 Device Room Air  Assess: MEWS Score  MEWS Temp 1  MEWS Systolic 1  MEWS Pulse 1  MEWS RR 0  MEWS LOC 1  MEWS Score 4  MEWS Score Color Red  Assess: if the MEWS score is Yellow or Red  Were vital signs taken at a resting state? Yes  Focused Assessment No change from prior assessment  Does the patient meet 2 or more of the SIRS criteria? No  Does the patient have a confirmed or suspected source of infection? No  MEWS guidelines implemented  Yes, red  Treat  MEWS Interventions Considered administering scheduled or prn medications/treatments as ordered  Take Vital Signs  Increase Vital Sign Frequency  Red: Q1hr x2, continue Q4hrs until patient remains green for 12hrs  Escalate  MEWS: Escalate Red: Discuss with charge nurse and notify provider. Consider notifying RRT. If remains red for 2 hours consider need for higher level of care  Provider Notification  Provider Name/Title Maisie Fus. MD  Date Provider Notified 03/13/23  Time Provider Notified 1440  Method of Notification  (messenger)  Notification Reason Other (Comment) (Red mews, decreased responsiveness.)  Provider response Evaluate remotely  Date of Provider Response 03/13/23  Time of Provider Response 1455  Assess: SIRS CRITERIA  SIRS Temperature  1  SIRS Pulse 1  SIRS Respirations  0  SIRS WBC 0  SIRS Score Sum  2

## 2023-03-13 NOTE — Progress Notes (Signed)
[facility] Progress Note  CC:  Rectal bleeding, constipation   Subjective: No family at the bedside. Patient does not answer questions, mumbling words. RN stated patient refuses to eat or to swallow pills. Briefly hypotensive after dialysis today. Received a milk/molasses enema, passed a small volume of loose stool.    Objective:  Vital signs in last 24 hours: Temp:  [96.7 F (35.9 C)-100 F (37.8 C)] 96.7 F (35.9 C) (06/19 1322) Pulse Rate:  [93-115] 101 (06/19 1444) Resp:  [16-30] 20 (06/19 1444) BP: (88-129)/(49-72) 88/54 (06/19 1444) SpO2:  [92 %-98 %] 95 % (06/19 1444) Weight:  [48.3 kg] 48.3 kg (06/19 0820) Last BM Date : 03/12/23 General: Ill appearing 20 year of female.  Heart: Tachycardic, HR 100's. No murmur.  Pulm: Breath sounds clear, decreased in the base with labored respirations. Abdomen: Soft, nondistended. No overt signs of rebound or guarding when abdomen palpated.  Extremities:  No edema.  Neurologic:  Awake, does not follow commands. Speech Psych:  Mildly agitated.   Intake/Output from previous day: 06/18 0701 - 06/19 0700 In: 175 [P.O.:175] Out: -  Intake/Output this shift: No intake/output data recorded.  Lab Results: Recent Labs    03/11/23 0445 03/12/23 0150 03/13/23 0127  WBC 21.3* 14.3* 14.9*  HGB 9.2* 8.2* 7.7*  HCT 28.4* 25.9* 23.8*  PLT 323 297 367   BMET Recent Labs    03/12/23 0105 03/13/23 0127  NA 135 133*  K 4.5 4.7  CL 94* 92*  CO2 24 26  GLUCOSE 81 98  BUN 27* 50*  CREATININE 3.55* 5.21*  CALCIUM 10.4* 10.5*   LFT Recent Labs    03/13/23 0127  PROT 6.7  ALBUMIN 2.5*  AST 21  ALT 10  ALKPHOS 61  BILITOT 0.5   PT/INR No results for input(s): "LABPROT", "INR" in the last 72 hours. Hepatitis Panel No results for input(s): "HEPBSAG", "HCVAB", "HEPAIGM", "HEPBIGM" in the last 72 hours.  DG Abd 1 View  Result Date: 03/13/2023 CLINICAL DATA:  Pneumonia.  Constipation. EXAM: ABDOMEN - 1 VIEW  COMPARISON:  Abdominal CT 03/12/2023. FINDINGS: 0620 hours. The bowel gas pattern is nonobstructive. There is mildly prominent stool in the distal colon including the rectum, slightly improved from recent CT. There are probable bowel anastomosis clips in the right mid abdomen as well as cholecystectomy clips. No suspicious abdominal calcifications. Thoracolumbar scoliosis and previous left hip arthroplasty noted. IMPRESSION: Mildly prominent stool in the distal colon, slightly improved from recent CT. No evidence of bowel obstruction. Electronically Signed   By: Carey Bullocks M.D.   On: 03/13/2023 10:08   DG CHEST PORT 1 VIEW  Result Date: 03/13/2023 CLINICAL DATA:  Pneumonia. EXAM: PORTABLE CHEST 1 VIEW COMPARISON:  Radiographs 03/10/2023 and 02/26/2023. Abdominal CT 03/12/2023. FINDINGS: 0617 hours. The heart size and mediastinal contours are stable with mild cardiomegaly and aortic atherosclerosis. There are lower lung volumes with increasing patchy left basilar airspace disease. Small bilateral pleural effusions are present. No edema or pneumothorax. The bones appear unchanged. Telemetry leads overlie the chest. IMPRESSION: Lower lung volumes with increasing patchy left basilar airspace disease, suspicious for pneumonia. Small bilateral pleural effusions. Electronically Signed   By: Carey Bullocks M.D.   On: 03/13/2023 10:06   CT ABDOMEN PELVIS WO CONTRAST  Result Date: 03/12/2023 CLINICAL DATA:  Abdominal pain EXAM: CT ABDOMEN AND PELVIS WITHOUT CONTRAST TECHNIQUE: Multidetector CT imaging of the abdomen and pelvis was performed following the standard protocol without IV contrast. RADIATION DOSE  REDUCTION: This exam was performed according to the departmental dose-optimization program which includes automated exposure control, adjustment of the mA and/or kV according to patient size and/or use of iterative reconstruction technique. COMPARISON:  12/26/2022 FINDINGS: Lower chest: Interval appearance of  small bilateral pleural effusions, more so on the left side. Small patchy infiltrates are seen in the posterior lower lung fields suggesting atelectasis/pneumonia. Small pericardial effusion is present. There are scattered coronary artery calcifications. There are scattered calcifications in aorta. Hepatobiliary: Surgical clips are seen in gallbladder fossa. Pancreas: No focal abnormalities are seen. Spleen: Unremarkable. Adrenals/Urinary Tract: Adrenals are unremarkable. Right kidney is small in size. There is 2 mm calculus in the upper pole of right kidney. There are multiple smooth marginated low-density lesions in both kidneys largest measuring 4 cm in size in the left kidney suggesting renal cysts. Ureters are unremarkable. Urinary bladder is not distended. Stomach/Bowel: Stomach is unremarkable. Small bowel loops are not dilated. The appendix is not seen. There is no pericecal inflammation. There is no significant wall thickening in colon. Large amount of stool is seen in rectum. Transverse diameter of rectum measures 8 cm. Rays wall thickening in rectum. Knees perirectal stranding. Vascular/Lymphatic: Scattered arterial calcifications are seen in aorta and its major branches. Reproductive: Unremarkable. Other: There is no ascites or pneumoperitoneum. Musculoskeletal: Severe degenerative changes are noted in lumbar spine, most prominent at L1-L2, L3-L4 and L4-L5 levels. No significant interval changes are noted. There is left hip arthroplasty. IMPRESSION: There is large amount of stool in rectum. There is wall thickening in rectum and perirectal stranding. Findings suggest fecal impaction and possible proctitis. There is no evidence of intestinal obstruction or pneumoperitoneum. There is no hydronephrosis. There are small new patchy infiltrates in both lower lung fields suggesting atelectasis/pneumonia. Small bilateral pleural effusions, more so on the left side. Small pericardial effusion. Coronary artery  disease. Possible small 2 mm right renal stone. Multiple renal cysts. Severe lumbar spondylosis. Left hip arthroplasty. Aortic arteriosclerosis. Electronically Signed   By: Ernie Avena M.D.   On: 03/12/2023 11:53    Assessment / Plan:  Hematochezia: Initially seen overnight on 03/11/2023, the next day she was disimpacted with a large ball of hard stool removed from the rectum. CT shows significant stool burden, particularly in the rectum, with surrounding proctitis. Suspect that the inflammation in her rectum is due to stercoral colitis.  Received half movi prep 6/18. Abd xray today showed prominent stool in the distal colon. Received a milk/molasses enemas today and passed a small amount of nonbloody loose stool.  -SMOG enema as previously ordered (not administered as patient was in dialysis earlier today) -Continue Miralax bid, Senokot one tab bid -No plans for endoscopic evaluation at this time -Await further recommendations per Dr. Leonides Schanz -Consider Palliative care consult to establish goals of care   Left femoral neck fracture  ESRD on HD Monday - Wednesday - Friday  Leukocytosis. Possible pneumonia. WBC 14.9. Chest xray today showed lower lung volumes with increasing patchy left basilar airspace disease, suspicious for pneumonia. Small bilateral pleural effusions.  Anemia of chronic disease compounded by postoperative anemia. Transfused in unit of PRBCs on 6/14. Hg 8.2 -> today 7.7.  -CBC, iron panel in am -FOBT -Transfuse for Hg < 7  Malnutrition -May require tube feedings   Schizoaffective disorder/bipolar type with uncontrollable agitation intermittently   Active Problems:   ESRD on hemodialysis (HCC)   Schizoaffective disorder, bipolar type (HCC)   Hip fracture, left (HCC)   Malnutrition of moderate degree  Hematochezia   Constipation   Gastroesophageal reflux disease   BRBPR (bright red blood per rectum)   Leukocytosis   Sinus tachycardia   Anemia due to  chronic kidney disease, on chronic dialysis (HCC)     LOS: 15 days   Madison [name]  03/13/2023, 4:29PM

## 2023-03-13 NOTE — Progress Notes (Signed)
Patient ID: Madison [name], female   DOB: Jan 04, 1951, 72 y.o.   MRN: [id number] [facility] Progress Note   Assessment/ Plan:   1.  Status post hemiarthroplasty for left hip fracture: Postoperative and awaiting outpatient placement to skilled nursing facility along with OP orthopedic surgery follow up.  2. ESRD: On HD per a Monday/Wednesday/Friday schedule (previously [facility] in [place])  - tolerated HD in a recliner.   - HD per MWF schedule  3. Anemia CKD:  she has been on aranesp 60 mcg every week.  Her dose was retimed for today.  Increased to 100 mcg every Tuesday for next dose. Monitor for need for transfusions.  With report of blood per rectum as below  4. CKD-MBD: hyperphosphatemia - on sevelamer.  Will need a reduced ca bath for now if able (unfortunately we are limited inpatient)   5. Hypertension: acceptable control   6. Bright red blood per rectum - Note that GI has been consulted.  May have been hemorrhoids?   7.  Leukocytosis: ongoing evaluation for possible infection source with concomitant tachycardia.  Antibiotics per primary team discretion. Improved slightly    8.  Increased stool burden - s/p manual disimpaction on 6/17 per nursing.  Note CT suggests fecal impaction and possible proctitis. No obstruction.  Would continue efforts to disimpact and reduce stool burden.  Patient with some difficulty with PO meds.  Per primary team.  Would repeat milk and molasses enema if needed.  She is in the HD unit and not able to provide additional history   9. Altered mental status  - Her baseline is still unclear to me - her sister states that she (the sister) makes the patient's medical decisions  Disposition - she is changing SNF's and will need a new outpatient dialysis unit.  (She is not able to be discharged without a new outpatient HD unit confirmed).  However note that at this time she is not acceptable for outpatient HD (she is altered which is  hopefully reversible)   Subjective:    Seen and examined on dialysis at 10:19 am.  Procedure supervised.  Blood pressure 100/55 and HR 113.  Tolerating goal.  Her AVF is not working well today - high venous pressure.  She had a large BM with milk and molasses enema charted yesterday.     Review of systems:    Unable to obtain secondary to AMS      Objective:   BP 106/68   Pulse (!) 105   Temp 98.5 F (36.9 C) (Axillary)   Resp (!) 21   Ht 5\' 4"  (1.626 m)   Wt 48.3 kg   SpO2 96%   BMI 18.28 kg/m   Physical Exam:   General elderly female in bed in discomfort HEENT normocephalic atraumatic extraocular movements intact sclera anicteric Neck supple trachea midline Lungs clear to auscultation bilaterally normal work of breathing at rest  Heart S1S2 no rub Abdomen soft nontender nondistended Extremities no edema  Neuro - patient with minimal verbal response to exam which is consistent with my prior exams Psych - agitated Access LUE AVF in use    Labs: BMET Recent Labs  Lab 03/07/23 0251 03/08/23 0523 03/09/23 0158 03/10/23 0110 03/12/23 0105 03/13/23 0127  NA 132* 129* 135 132* 135 133*  K 4.5 5.3* 4.3 4.9 4.5 4.7  CL 90* 88* 92* 89* 94* 92*  CO2 26 24 27 26 24 26   GLUCOSE 103* 82 105* 103* 81 98  BUN 36* 58* 34* 56* 27* 50*  CREATININE 4.73* 6.81* 4.34* 6.11* 3.55* 5.21*  CALCIUM 9.8 10.1 10.0 10.1 10.4* 10.5*  PHOS 4.3 7.5* 4.7* 5.7* 4.4 4.4   CBC Recent Labs  Lab 03/09/23 0836 03/10/23 0110 03/11/23 0445 03/12/23 0150 03/13/23 0127  WBC 11.8* 13.4* 21.3* 14.3* 14.9*  NEUTROABS 8.9* 10.3*  --  11.9* 11.9*  HGB 9.6* 9.0* 9.2* 8.2* 7.7*  HCT 29.6* 28.2* 28.4* 25.9* 23.8*  MCV 90.8 89.5 90.7 89.3 91.2  PLT 289 297 323 297 367      Medications:     sodium chloride   Intravenous Once   acetaminophen  650 mg Oral TID   benztropine  0.5 mg Oral BID   Chlorhexidine Gluconate Cloth  6 each Topical Q0600   Chlorhexidine Gluconate Cloth  6 each Topical  Q0600   [START ON 03/19/2023] darbepoetin (ARANESP) injection - NON-DIALYSIS  100 mcg Subcutaneous Q Tue-1800   docusate  100 mg Oral BID   feeding supplement  1 Container Oral TID BM   Glycerin (Adult)  1 suppository Rectal Once   metoprolol tartrate  12.5 mg Oral BID   mirtazapine  15 mg Oral QHS   multivitamin  1 tablet Oral QHS   mouth rinse  15 mL Mouth Rinse 4 times per day   pantoprazole (PROTONIX) IV  40 mg Intravenous Q12H   polyethylene glycol  17 g Oral BID   senna-docusate  1 tablet Oral BID   sevelamer carbonate  1,600 mg Oral TID WC   valproic acid  250 mg Oral TID WC   Estanislado Emms, MD 03/13/2023, 10:27 AM

## 2023-03-13 NOTE — Plan of Care (Signed)
  Problem: Clinical Measurements: Goal: Ability to maintain clinical measurements within normal limits will improve Outcome: Progressing   

## 2023-03-14 ENCOUNTER — Inpatient Hospital Stay (HOSPITAL_COMMUNITY): Payer: Medicare Other

## 2023-03-14 DIAGNOSIS — N186 End stage renal disease: Secondary | ICD-10-CM | POA: Diagnosis not present

## 2023-03-14 DIAGNOSIS — K922 Gastrointestinal hemorrhage, unspecified: Secondary | ICD-10-CM | POA: Diagnosis not present

## 2023-03-14 DIAGNOSIS — D72829 Elevated white blood cell count, unspecified: Secondary | ICD-10-CM | POA: Diagnosis not present

## 2023-03-14 DIAGNOSIS — Z992 Dependence on renal dialysis: Secondary | ICD-10-CM | POA: Diagnosis not present

## 2023-03-14 DIAGNOSIS — I495 Sick sinus syndrome: Secondary | ICD-10-CM

## 2023-03-14 LAB — CBC
HCT: 22.5 % — ABNORMAL LOW (ref 36.0–46.0)
Hemoglobin: 7.3 g/dL — ABNORMAL LOW (ref 12.0–15.0)
MCH: 28.5 pg (ref 26.0–34.0)
MCHC: 32.4 g/dL (ref 30.0–36.0)
MCV: 87.9 fL (ref 80.0–100.0)
Platelets: 351 10*3/uL (ref 150–400)
RBC: 2.56 MIL/uL — ABNORMAL LOW (ref 3.87–5.11)
RDW: 18.9 % — ABNORMAL HIGH (ref 11.5–15.5)
WBC: 12.1 10*3/uL — ABNORMAL HIGH (ref 4.0–10.5)
nRBC: 0 % (ref 0.0–0.2)

## 2023-03-14 LAB — IRON AND TIBC
Iron: 15 ug/dL — ABNORMAL LOW (ref 28–170)
Saturation Ratios: 13 % (ref 10.4–31.8)
TIBC: 116 ug/dL — ABNORMAL LOW (ref 250–450)
UIBC: 101 ug/dL

## 2023-03-14 LAB — RENAL FUNCTION PANEL
Albumin: 2.3 g/dL — ABNORMAL LOW (ref 3.5–5.0)
Anion gap: 22 — ABNORMAL HIGH (ref 5–15)
BUN: 29 mg/dL — ABNORMAL HIGH (ref 8–23)
CO2: 25 mmol/L (ref 22–32)
Calcium: 10.4 mg/dL — ABNORMAL HIGH (ref 8.9–10.3)
Chloride: 91 mmol/L — ABNORMAL LOW (ref 98–111)
Creatinine, Ser: 3.77 mg/dL — ABNORMAL HIGH (ref 0.44–1.00)
GFR, Estimated: 12 mL/min — ABNORMAL LOW (ref >60)
Glucose, Bld: 123 mg/dL — ABNORMAL HIGH (ref 70–99)
Phosphorus: 4.2 mg/dL (ref 2.5–4.6)
Potassium: 4.1 mmol/L (ref 3.5–5.1)
Sodium: 138 mmol/L (ref 135–145)

## 2023-03-14 LAB — CULTURE, BLOOD (ROUTINE X 2): Culture: NO GROWTH

## 2023-03-14 LAB — FOLATE: Folate: 26.1 ng/mL (ref >5.9)

## 2023-03-14 LAB — RETICULOCYTES
Immature Retic Fract: 21 % — ABNORMAL HIGH (ref 2.3–15.9)
RBC.: 2.54 MIL/uL — ABNORMAL LOW (ref 3.87–5.11)
Retic Count, Absolute: 21.6 10*3/uL (ref 19.0–186.0)
Retic Ct Pct: 0.9 % (ref 0.4–3.1)

## 2023-03-14 LAB — VITAMIN B12: Vitamin B-12: 1108 pg/mL — ABNORMAL HIGH (ref 180–914)

## 2023-03-14 LAB — FERRITIN: Ferritin: 4206 ng/mL — ABNORMAL HIGH (ref 11–307)

## 2023-03-14 MED ORDER — METOPROLOL TARTRATE 12.5 MG HALF TABLET
12.5000 mg | ORAL_TABLET | Freq: Two times a day (BID) | ORAL | Status: DC
  Filled 2023-03-14: qty 1

## 2023-03-14 MED ORDER — PIPERACILLIN-TAZOBACTAM IN DEX 2-0.25 GM/50ML IV SOLN
2.2500 g | Freq: Three times a day (TID) | INTRAVENOUS | Status: DC
  Administered 2023-03-14 – 2023-03-21 (×18): 2.25 g via INTRAVENOUS
  Filled 2023-03-14 (×24): qty 50

## 2023-03-14 MED ORDER — MIDODRINE HCL 5 MG PO TABS
10.0000 mg | ORAL_TABLET | Freq: Three times a day (TID) | ORAL | Status: DC
  Administered 2023-03-14 – 2023-03-23 (×17): 10 mg via ORAL
  Filled 2023-03-14 (×23): qty 2

## 2023-03-14 MED ORDER — CHLORHEXIDINE GLUCONATE CLOTH 2 % EX PADS: 6.0000 | MEDICATED_PAD | Freq: Every day | CUTANEOUS | Status: DC

## 2023-03-14 NOTE — H&P (View-Only) (Signed)
   [facility] Progress Note  CC:  Rectal bleeding, constipation     Subjective: RN stated patient passed a large amount of solid then loose stool overnight after SMOG enema given last evening. No bloody stools. No BM today. Water enema ordered, not yet given. Patient took her pills today and drank 2 cans of ensure. No family at the bedside.    Objective:  Vital signs in last 24 hours: Temp:  [98 F (36.7 C)-99.3 F (37.4 C)] 99.3 F (37.4 C) (06/20 0729) Pulse Rate:  [98-105] 98 (06/20 0451) Resp:  [17-29] 20 (06/20 0729) BP: (88-126)/(53-66) 99/53 (06/20 0451) SpO2:  [95 %-98 %] 97 % (06/20 0729) Weight:  [46.9 kg] 46.9 kg (06/20 0500) Last BM Date : 03/13/23 General: 72 year old female awake in NAD.  Heart: RRR, no murmur.  Pulm: Breath sounds clear, decreased in the bases.  Abdomen: Soft, nondistended. Nontender. BS x 4 quadrants.  Extremities:  Without edema. Neurologic:  Awake, follows commands. Answers ya to all questions.  Psych:  Alert and cooperative. Calm. Normal mood and affect.  Intake/Output from previous day: 06/19 0701 - 06/20 0700 In: 150 [IV Piggyback:150] Out: 1 [Stool:1] Intake/Output this shift: No intake/output data recorded.  Lab Results: Recent Labs    03/12/23 0150 03/13/23 0127 03/14/23 0045  WBC 14.3* 14.9* 12.1*  HGB 8.2* 7.7* 7.3*  HCT 25.9* 23.8* 22.5*  PLT 297 367 351   BMET Recent Labs    03/12/23 0105 03/13/23 0127 03/14/23 0950  NA 135 133* 138  K 4.5 4.7 4.1  CL 94* 92* 91*  CO2 24 26 25  GLUCOSE 81 98 123*  BUN 27* 50* 29*  CREATININE 3.55* 5.21* 3.77*  CALCIUM 10.4* 10.5* 10.4*   LFT Recent Labs    03/13/23 0127 03/14/23 0950  PROT 6.7  --   ALBUMIN 2.5* 2.3*  AST 21  --   ALT 10  --   ALKPHOS 61  --   BILITOT 0.5  --    PT/INR No results for input(s): "LABPROT", "INR" in the last 72 hours. Hepatitis Panel No results for input(s): "HEPBSAG", "HCVAB", "HEPAIGM", "HEPBIGM" in the last 72  hours.  DG HIP UNILAT WITH PELVIS 2-3 VIEWS LEFT  Result Date: 03/14/2023 CLINICAL DATA:  Evaluate for hip fracture. EXAM: DG HIP (WITH OR WITHOUT PELVIS) 2-3V LEFT COMPARISON:  CT AP 03/12/2023 FINDINGS: Status post left hip arthroplasty. No signs of periprosthetic fracture or subluxation. Right hip is intact and located. No signs of pelvic fracture or diastasis. IMPRESSION: Status post left hip arthroplasty. No acute findings. Electronically Signed   By: Taylor  Stroud M.D.   On: 03/14/2023 13:11   DG Abd 1 View  Result Date: 03/13/2023 CLINICAL DATA:  Pneumonia.  Constipation. EXAM: ABDOMEN - 1 VIEW COMPARISON:  Abdominal CT 03/12/2023. FINDINGS: 0620 hours. The bowel gas pattern is nonobstructive. There is mildly prominent stool in the distal colon including the rectum, slightly improved from recent CT. There are probable bowel anastomosis clips in the right mid abdomen as well as cholecystectomy clips. No suspicious abdominal calcifications. Thoracolumbar scoliosis and previous left hip arthroplasty noted. IMPRESSION: Mildly prominent stool in the distal colon, slightly improved from recent CT. No evidence of bowel obstruction. Electronically Signed   By: William  Veazey M.D.   On: 03/13/2023 10:08   DG CHEST PORT 1 VIEW  Result Date: 03/13/2023 CLINICAL DATA:  Pneumonia. EXAM: PORTABLE CHEST 1 VIEW COMPARISON:  Radiographs 03/10/2023 and 02/26/2023. Abdominal CT 03/12/2023.   FINDINGS: 0617 hours. The heart size and mediastinal contours are stable with mild cardiomegaly and aortic atherosclerosis. There are lower lung volumes with increasing patchy left basilar airspace disease. Small bilateral pleural effusions are present. No edema or pneumothorax. The bones appear unchanged. Telemetry leads overlie the chest. IMPRESSION: Lower lung volumes with increasing patchy left basilar airspace disease, suspicious for pneumonia. Small bilateral pleural effusions. Electronically Signed   By: William  Veazey  M.D.   On: 03/13/2023 10:06    Assessment / Plan:  Hematochezia: Initially seen overnight on 03/11/2023, the next day she was disimpacted with a large ball of hard stool removed from the rectum. CT shows significant stool burden, particularly in the rectum, with surrounding proctitis. Suspect that the inflammation in her rectum is due to stercoral colitis.  Received half movi prep 6/18. Abd xray today showed prominent stool in the distal colon. Received a milk/molasses enema and a SMOG enema yesterday, passed a large amount of solid then loose stool. No bloody stools.  -Continue Miralax bid, Senokot one tab bid -Await further recommendations per Dr. Dorsey -Consider Palliative care consult to establish goals of care    Left femoral neck fracture   ESRD on HD every M-W-F   Leukocytosis. Possible pneumonia. WBC 14.9. Chest xray today showed lower lung volumes with increasing patchy left basilar airspace disease, suspicious for pneumonia. Small bilateral pleural effusions.   Anemia of chronic disease compounded by postoperative anemia. Transfused in unit of PRBCs on 6/14. Hg 8.2 -> 7.7 -> today Hg 7.3.  Iron 15.  Saturation ratio is 13.  TIBC 116.  B12 level 1108.  Folate 26.1. -FOBT -Transfuse for Hg < 7   Malnutrition -Diet as tolerated, Ensure 2 cans daily  -May require tube feedings    Schizoaffective disorder/bipolar type with uncontrollable agitation intermittently   Active Problems:   ESRD on hemodialysis (HCC)   Schizoaffective disorder, bipolar type (HCC)   Hip fracture, left (HCC)   Malnutrition of moderate degree   Hematochezia   Constipation   Gastroesophageal reflux disease   BRBPR (bright red blood per rectum)   Leukocytosis   Sinus tachycardia   Anemia due to chronic kidney disease, on chronic dialysis (HCC)     LOS: 16 days   Brandley Aldrete M Kennedy-Smith  03/14/2023, 2:40PM    

## 2023-03-14 NOTE — Progress Notes (Signed)
PHARMACY NOTE:  ANTIMICROBIAL RENAL DOSAGE ADJUSTMENT  Current antimicrobial regimen includes a mismatch between antimicrobial dosage and estimated renal function.  As per policy approved by the Pharmacy & Therapeutics and Medical Executive Committees, the antimicrobial dosage will be adjusted accordingly.  Current antimicrobial dosage:  Zosyn 2.25g IV q6 hours  Indication: pneumonia  Renal Function:  Estimated Creatinine Clearance: 7.1 mL/min (A) (by C-G formula based on SCr of 5.21 mg/dL (H)). [x]      On HD, scheduled: MWF []      On CRRT    Antimicrobial dosage has been changed to:  Zosyn 2.25g IV q8 hours  Additional comments:   Thank you for allowing pharmacy to be a part of this patient's care.  Rexford Maus, PharmD, BCPS 03/14/2023 7:23 AM

## 2023-03-14 NOTE — Progress Notes (Signed)
Orthopedic Tech Progress Note Patient Details:  Madison [name] July 14, 1951 [id number]  Ortho Devices Type of Ortho Device: Abduction pillow Ortho Device/Splint Interventions: Ordered, Application, Adjustment   Post Interventions Patient Tolerated: Fair [place] abd brace applied. [name] 03/14/2023, 1:24 AM

## 2023-03-14 NOTE — Progress Notes (Signed)
Patient ID: Madison [name], female   DOB: 12-31-1950, 72 y.o.   MRN: [id number] [facility] Progress Note   Assessment/ Plan:   1.  Status post hemiarthroplasty for left hip fracture: Postoperative and awaiting outpatient placement to skilled nursing facility along with OP orthopedic surgery follow up.  2. ESRD: On HD per a Monday/Wednesday/Friday schedule (previously [facility] in [place])  - tolerated HD in a recliner however altered and not yet appropriate for outpatient HD.  She is improving, though (new today).  I have asked HD SW to reach back out to the A team tomorrow about her.  She is applying for a chair at a new outpatient HD unit closer to new SNF  - HD per MWF schedule  3. Anemia CKD:  Increased aranesp to 100 mcg every Tuesday for next dose. Monitor for need for transfusions.  Also with blood per rectum as below  4. CKD-MBD: hyperphosphatemia - on sevelamer.  Will need a reduced ca bath for now if able (unfortunately we are limited inpatient)   5. Hypertension: acceptable control   6. Bright red blood per rectum - Note that GI has been consulted.  Appreciate their assistance   7.  Proctitis: Antibiotics per primary team discretion  8.  Increased stool burden - s/p manual disimpaction on 6/17 per nursing.  Note CT suggests fecal impaction and possible proctitis. No obstruction.  Would continue efforts to disimpact and reduce stool burden.  Patient with some difficulty with PO meds.  Per primary team.   9. Altered mental status  - Her exact baseline is still unclear to me - her sister states that she (the sister) makes the patient's medical decisions  Disposition - she is changing SNF's and will need a new outpatient dialysis unit.  (She is not able to be discharged without a new outpatient HD unit confirmed).  Note that at this time she is not acceptable for outpatient HD.  Her mental status is improving thankfully.  She needs to be able to  communicate her needs in the outpatient dialysis setting    Subjective:    Last HD on 6/19 and was kept even.   She has a BM charted yesterday and per nurse several BM's not charted overnight.  She does not offer additional history today however she was able to provide her full name for me which is a big change from before  Review of systems:    Unable to obtain secondary to AMS      Objective:   BP (!) 119/54 (BP Location: Right Arm)   Pulse 98   Temp 98.6 F (37 C)   Resp 16   Ht 5\' 4"  (1.626 m)   Wt 46.9 kg   SpO2 97%   BMI 17.75 kg/m   Physical Exam:    General elderly female in bed in NAD HEENT normocephalic atraumatic extraocular movements intact sclera anicteric Neck supple trachea midline Lungs clear to auscultation bilaterally normal work of breathing at rest  Heart S1S2 no rub Abdomen soft nontender nondistended Extremities no edema  Neuro - she is oriented to person (gives me her first, middle, and last name) Psych - no agitation Access LUE AVF with bruit and thrill     Labs: BMET Recent Labs  Lab 03/08/23 0523 03/09/23 0158 03/10/23 0110 03/12/23 0105 03/13/23 0127 03/14/23 0950  NA 129* 135 132* 135 133* 138  K 5.3* 4.3 4.9 4.5 4.7 4.1  CL 88* 92* 89* 94* 92*  91*  CO2 24 27 26 24 26 25   GLUCOSE 82 105* 103* 81 98 123*  BUN 58* 34* 56* 27* 50* 29*  CREATININE 6.81* 4.34* 6.11* 3.55* 5.21* 3.77*  CALCIUM 10.1 10.0 10.1 10.4* 10.5* 10.4*  PHOS 7.5* 4.7* 5.7* 4.4 4.4 4.2   CBC Recent Labs  Lab 03/09/23 0836 03/10/23 0110 03/11/23 0445 03/12/23 0150 03/13/23 0127 03/14/23 0045  WBC 11.8* 13.4* 21.3* 14.3* 14.9* 12.1*  NEUTROABS 8.9* 10.3*  --  11.9* 11.9*  --   HGB 9.6* 9.0* 9.2* 8.2* 7.7* 7.3*  HCT 29.6* 28.2* 28.4* 25.9* 23.8* 22.5*  MCV 90.8 89.5 90.7 89.3 91.2 87.9  PLT 289 297 323 297 367 351      Medications:     sodium chloride   Intravenous Once   acetaminophen  650 mg Oral TID   ARIPiprazole  15 mg Oral Daily    benztropine  0.5 mg Oral BID   Chlorhexidine Gluconate Cloth  6 each Topical Q0600   Chlorhexidine Gluconate Cloth  6 each Topical Q0600   [START ON 03/19/2023] darbepoetin (ARANESP) injection - NON-DIALYSIS  100 mcg Subcutaneous Q Tue-1800   docusate  100 mg Oral BID   feeding supplement  1 Container Oral TID BM   Glycerin (Adult)  1 suppository Rectal Once   metoprolol tartrate  12.5 mg Oral BID   midodrine  10 mg Oral TID WC   mirtazapine  15 mg Oral QHS   multivitamin  1 tablet Oral QHS   mouth rinse  15 mL Mouth Rinse 4 times per day   pantoprazole (PROTONIX) IV  40 mg Intravenous Q12H   polyethylene glycol  17 g Oral BID   senna-docusate  1 tablet Oral BID   sevelamer carbonate  1,600 mg Oral TID WC   valproic acid  250 mg Oral TID WC   Estanislado Emms, MD 03/14/2023, 4:47 PM

## 2023-03-14 NOTE — Progress Notes (Signed)
Case discussed with nephrologist yesterday. Pt is not appropriate for out-pt HD at this time. Case discussed with CSW this am. Will assist as needed.   Olivia Canter Renal Navigator [phone]

## 2023-03-14 NOTE — Progress Notes (Addendum)
PROGRESS NOTE    Madison [name]  ZOX:[id number] DOB: 1950/10/14 DOA: 02/26/2023 PCP: Charlynne Pander, MD    No chief complaint on file.   Brief Narrative:  The patient is a 72 year old SNF resident with past medical history significant for but limited to ESRD on hemodialysis Monday Wednesday Friday, hypertension, history of schizophrenia versus bipolar disorder as well as other comorbidities who presented to the [facility] ER after a fall which occurred multiple days prior.  X-rays at [facility] ER revealed a left femoral fracture and she was transferred to [facility] given that she needed a combination of the orthopedist as well as inpatient dialysis.  She underwent unipolar Hemi arthroplasty of the left hip on 02/28/2023 and has been deemed stable from a orthopedic standpoint.  She had issues with her dialysis catheter which has not been fixed and she is getting dialysis.  She was extremely agitated and hysterically crying early on during the hospitalization and remained emotionally labile and tearful however was calmer.   We were anticipating discharging back to SNF with resumption of her normal dialysis session on Friday, 03/08/2023 however a barrier to discharge was that the patient will need to be able to tolerate sitting in a chair for 4 to 5 hours to be appropriate for outpatient HD for discharge.  She was able to dialyze in the chair early on during the hospitalization, appropriately and had no issues with that but heart rate remained elevated in the setting of pain and agitation so she was initiated on a beta-blocker and given some pain medication.    Heart rate was improved however she now has a slight leukocytosis and after repeat is trending down.  TOC attempted to call the facility and got an response so unfortunately could not discharge given that no one at the bedside picked up.  Case worker is informed that the patient's legal guardian was unhappy with the SNF choice and requested to change her  SNF placement to [facility].  [facility] is able to take the patient at their facility  however because of the symptoms changed her dialysis center will need to be moved.     CBC continue to further worsen as she had bright red blood per rectum and was found to be impacted.  Having large stool ball and her bowel regimen has been escalated and obtained CT scan.  GI was consulted given her hemoglobin and bleeding and has bleeding noted to be subsiding, GI not recommending endoscopic procedures at this time.  GI feels patient bleeding likely secondary to inflammation in the rectum due to stercoral colitis, and the meds recommended for patient's constipation a few no further additional bleeding at this time..  Will need to continue monitor carefully.     Assessment & Plan:   Active Problems:   ESRD on hemodialysis (HCC)   Schizoaffective disorder, bipolar type (HCC)   Hip fracture, left (HCC)   Malnutrition of moderate degree   Hematochezia   Constipation   Gastroesophageal reflux disease   BRBPR (bright red blood per rectum)   Leukocytosis   Sinus tachycardia   Anemia due to chronic kidney disease, on chronic dialysis (HCC)  #1 left femoral neck fracture -Secondary to mechanical fall. -Patient seen in consultation by orthopedics underwent unipolar hemiarthroplasty of the left hip 02/28/2023 without any significant complications. -Continue current pain regimen of oxycodone 5 to 10 mg every 4 hours as needed moderate pain, acetaminophen 650 mg 3 times daily, fentanyl 12.5 mg every 2  hours as needed for severe pain. -Orthopedics recommending WBAT left lower extremity with assistance and posterior hip precautions for 12 weeks with daily wound care as needed and DVT prophylaxis with subcu heparin x 28 days. -Continue current bowel regimen. -Outpatient follow-up with orthopedic surgery 10 days postdischarge.  2.  Schizoaffective disorder/bipolar type with uncontrollable crying and agitation  intermittently -Continue home regimen Abilify, Depakote, Cogentin.  -Patient noted to have been extremely agitated and anxious a few days ago however noted to be drowsy on 03/13/2023, alert today 03/14/2023 however tearful and keeps repeating that she looks like a tramp.  Patient difficult to redirect. -RN concerned about patient's inability to swallow, SLP evaluated patient patient cleared for regular diet. -Continue mirtazapine 15 mg nightly. -Haldol as needed. -Continue treatment for probable pneumonia.   -Will consult with psychiatry for further management of schizoaffective disorder/bipolar disorder with bouts of uncontrollable crying and intermittent agitation.  May need adjustments to medications which we will defer to psychiatry.   3.  ESRD on HD Monday Wednesday Friday/hyperphosphatemia improved with dialysis/elevated anion gap -Patient noted with ongoing issues with dialysis cannulation which has been corrected post ultrasound-guided access of left upper extremity fistula for balloon angioplasty of outflow stenosis at the axillary vein with no residual on completion per IR 03/04/2023.. -Continue sevelamer 1600 mg 3 times daily, avoid nephrotoxic medications, contrast dyes, hypotension and dehydration to ensure adequate renal perfusion.  -Nephrology following and feel patient needs to be dialyzed in the chair prior to being discharged and these improved mental status prior to being set up for outpatient dialysis.  -Per nephrology.  4.  Sinus tachycardia -Noted in the setting of GI bleed constipation. -CT abdomen and pelvis with concerns for constipation, rectal wall thickening, perirectal stranding, fecal impaction. -Currently on beta-blocker which we will hold this morning due to soft blood pressure.    5.  Constipation and fecal impaction -CT abdomen and pelvis done with large amount of stool in the rectum, wall thickening in the rectum perirectal stranding, findings suggesting fecal  impaction and possible proctitis. -No evidence of intestinal obstruction or pneumoperitoneum, no hydronephrosis. -Patient seen in consultation by GI. -Patient received milk of molasses enema with good results on 03/12/2023.  -Patient given another milk and molasses enema and a smog enema on 03/13/2023 with large amount of stool noted with no blood in it.  -Continue current bowel regimen of MiraLAX twice daily, Senokot-S twice daily. -GI following.  6.??  Pneumonia -CT scan done showed small new patchy infiltrates in both lower lower lung fields suggest atelectasis/pneumonia.  Small bilateral pleural effusions more on the left.  Small pericardial effusion, coronary artery disease. -Patient noted to be drowsy not awake enough to use a flutter valve incentive spirometry on 03/13/2023. -Patient more alert today.. -SLP assessed patient placed on a regular diet. -Patient with a leukocytosis white count of 14.9, repeat chest x-ray done 03/13/2023 with lower lung volumes with increasing patchy left basilar airspace disease, suspicious for pneumonia.  Small bilateral effusions. -Due to patient's drowsiness and leukocytosis patient started empirically on IV Zosyn to complete a course of antibiotic treatment.    7.  Leukocytosis in the setting of BRBPR -Initially felt to be reactive but trended up and now in the setting of likely GI bleed and constipation concerns for stercoral colitis. -Repeat chest x-ray done 03/13/2023, with increasing infiltrate concerning for pneumonia. -Blood cultures ordered and pending with no growth to date x 4 days. -CT abdomen and pelvis done with large amount of  stool in the rectum, wall thickening in the rectum and perirectal stranding findings suggestive fecal impaction and possible proctitis.  No evidence of intestinal obstruction or pneumoperitoneum.  No hydronephrosis. -Continue empiric IV Zosyn.    8.  Anemia of chronic kidney disease/postop anemia/BRBPR and concern for GI  bleed (Likely stercoral colitis) -Patient with no significant further bleeding. -Patient seen in consultation by GI who recommended adjusting bowel regimen and continue to monitor hemoglobin. -Patient with no known or significant bloody bowel movements. -Hemoglobin slowly trending down currently at 7.3.  -Patient receiving Darbepoetin alfa 60 mcg q. Mondays -Follow H&H, transfusion threshold hemoglobin < 7.   9.  Nonsevere protein calorie malnutrition, moderate -Secondary to chronic illnesses (ESRD, schizoaffective disorder) as evidenced by moderate fat depletion depletion. -Dietitian following, may need to engage other forms of nutrition if patient continues to refuse food and may consider consultation with palliative care.  10.  GERD -PPI.  11.  Hypoalbuminemia - Follow.  12.  Soft blood pressure -Patient noted to be on midodrine 15 mg twice daily prior to admission. -Midodrine resumed yesterday at 5 mg 3 times daily, which will increase to 10 mg 3 times daily for better blood pressure control. -Hold metoprolol today.   DVT prophylaxis: SCDs Code Status: Full Family Communication: Updated patient.  No family at bedside. Disposition: TBD.  Likely needs SNF when medically stable, and able to tolerate outpatient hemodialysis.  Status is: Inpatient Remains inpatient appropriate because: Severity of illness   Consultants:  Nephrology: Dr. Ronalee Belts 02/27/2023 Orthopedics: Dr. Carola Frost 02/28/2023 Interventional radiology: Dr. Loreta Ave 03/04/2023 Gastroenterology: Dr. Leonides Schanz 03/11/2023  Procedures:  CT abdomen pelvis 03/12/2023 Chest x-ray 02/26/2023, 03/10/2023, 03/13/2023 Abdominal x-rays 03/13/2023 Ultrasound-guided access left upper extremity fistula for balloon angioplasty of the outflow stenosis at the axillary vein with no residual on completion Through maintained: Per IR Dr. Loreta Ave 03/04/2023 Plain films of the left hip 02/28/2023 Unipolar hemiarthroplasty on the left hip: Per orthopedics:  Dr. Carola Frost 02/28/2023  Antimicrobials:  Anti-infectives (From admission, onward)    Start     Dose/Rate Route Frequency Ordered Stop   03/14/23 1400  piperacillin-tazobactam (ZOSYN) IVPB 2.25 g        2.25 g 100 mL/hr over 30 Minutes Intravenous Every 8 hours 03/14/23 0723     03/13/23 1700  piperacillin-tazobactam (ZOSYN) IVPB 2.25 g  Status:  Discontinued        2.25 g 100 mL/hr over 30 Minutes Intravenous Every 6 hours 03/13/23 1607 03/14/23 0723   03/13/23 1645  piperacillin-tazobactam (ZOSYN) IVPB 3.375 g  Status:  Discontinued        3.375 g 12.5 mL/hr over 240 Minutes Intravenous Every 8 hours 03/13/23 1557 03/13/23 1607   02/28/23 1430  ceFAZolin (ANCEF) IVPB 2g/100 mL premix  Status:  Discontinued        2 g 200 mL/hr over 30 Minutes Intravenous Every 6 hours 02/28/23 1334 02/28/23 1344   02/28/23 1147  vancomycin (VANCOCIN) powder  Status:  Discontinued          As needed 02/28/23 1147 02/28/23 1216   02/28/23 0830  ceFAZolin (ANCEF) IVPB 2g/100 mL premix        2 g 200 mL/hr over 30 Minutes Intravenous On call to O.R. 02/28/23 0731 02/28/23 1033   02/28/23 0830  vancomycin (VANCOCIN) IVPB 1000 mg/200 mL premix        1,000 mg 200 mL/hr over 60 Minutes Intravenous On call to O.R. 02/28/23 1610 02/28/23 [id number]  Subjective: Lying in bed.  tearful.  Keeps stating she looks " like a tramp."  Objective: Vitals:   03/14/23 0451 03/14/23 0500 03/14/23 0536 03/14/23 0729  BP: (!) 99/53     Pulse: 98     Resp: (!) 29  19 20   Temp: 98.2 F (36.8 C)   99.3 F (37.4 C)  TempSrc: Axillary   Oral  SpO2: 98%   97%  Weight:  46.9 kg    Height:        Intake/Output Summary (Last 24 hours) at 03/14/2023 1147 Last data filed at 03/14/2023 0100 Gross per 24 hour  Intake 150 ml  Output 1 ml  Net 149 ml    Filed Weights   03/13/23 0820 03/14/23 0500  Weight: 48.3 kg 46.9 kg    Examination:  General exam: NAD.  Respiratory system: CTAB.  No wheezes, no crackles, no  rhonchi.  Fair air movement.  Speaking full sentences.  Cardiovascular system: RRR no murmurs rubs or gallops.  No JVD.  No lower extremity edema.  Gastrointestinal system: Abdomen soft, nondistended, some tenderness to palpation in the lower abdominal region.  Positive bowel sounds.  No rebound.  No guarding.  Central nervous system: Alert.  Tearful.  Moving extremities spontaneously.  No focal neurological deficits. Extremities: Symmetric 5 x 5 power. Skin: No rashes, lesions or ulcers Psychiatry: Judgement and insight poor. Mood and affect unable to assess.      Data Reviewed: I have personally reviewed following labs and imaging studies  CBC: Recent Labs  Lab 03/09/23 0158 03/09/23 0836 03/10/23 0110 03/11/23 0445 03/12/23 0150 03/13/23 0127 03/14/23 0045  WBC 12.3* 11.8* 13.4* 21.3* 14.3* 14.9* 12.1*  NEUTROABS 9.1* 8.9* 10.3*  --  11.9* 11.9*  --   HGB 9.3* 9.6* 9.0* 9.2* 8.2* 7.7* 7.3*  HCT 28.3* 29.6* 28.2* 28.4* 25.9* 23.8* 22.5*  MCV 90.1 90.8 89.5 90.7 89.3 91.2 87.9  PLT 276 289 297 323 297 367 351     Basic Metabolic Panel: Recent Labs  Lab 03/08/23 0523 03/09/23 0158 03/10/23 0110 03/12/23 0105 03/13/23 0127 03/14/23 0950  NA 129* 135 132* 135 133* 138  K 5.3* 4.3 4.9 4.5 4.7 4.1  CL 88* 92* 89* 94* 92* 91*  CO2 24 27 26 24 26 25   GLUCOSE 82 105* 103* 81 98 123*  BUN 58* 34* 56* 27* 50* 29*  CREATININE 6.81* 4.34* 6.11* 3.55* 5.21* 3.77*  CALCIUM 10.1 10.0 10.1 10.4* 10.5* 10.4*  MG 1.8 1.8 1.8 1.7 1.9  --   PHOS 7.5* 4.7* 5.7* 4.4 4.4 4.2     GFR: Estimated Creatinine Clearance: 9.8 mL/min (A) (by C-G formula based on SCr of 3.77 mg/dL (H)).  Liver Function Tests: Recent Labs  Lab 03/08/23 0523 03/09/23 0158 03/10/23 0110 03/12/23 0105 03/13/23 0127 03/14/23 0950  AST 20 21 21 19 21   --   ALT 5 6 6 7 10   --   ALKPHOS 62 65 58 54 61  --   BILITOT 0.6 0.6 0.6 0.8 0.5  --   PROT 6.6 6.6 6.5 7.0 6.7  --   ALBUMIN 2.5* 2.4* 2.3* 2.8*  2.5* 2.3*     CBG: No results for input(s): "GLUCAP" in the last 168 hours.   Recent Results (from the past 240 hour(s))  Culture, blood (Routine X 2) w Reflex to ID Panel     Status: None (Preliminary result)   Collection Time: 03/10/23  9:51 AM   Specimen: BLOOD  Result  Value Ref Range Status   Specimen Description BLOOD SITE NOT SPECIFIED  Final   Special Requests   Final    BOTTLES DRAWN AEROBIC AND ANAEROBIC Blood Culture results may not be optimal due to an inadequate volume of blood received in culture bottles   Culture   Final    NO GROWTH 4 DAYS Performed at [facility], [address] [place], [place] [address]    Report Status PENDING  Incomplete  Culture, blood (Routine X 2) w Reflex to ID Panel     Status: None (Preliminary result)   Collection Time: 03/10/23  9:51 AM   Specimen: BLOOD  Result Value Ref Range Status   Specimen Description BLOOD SITE NOT SPECIFIED  Final   Special Requests   Final    AEROBIC BOTTLE ONLY Blood Culture results may not be optimal due to an inadequate volume of blood received in culture bottles   Culture   Final    NO GROWTH 4 DAYS Performed at [facility], [address], [place], [place] [address]    Report Status PENDING  Incomplete         Radiology Studies: DG Abd 1 View  Result Date: 03/13/2023 CLINICAL DATA:  Pneumonia.  Constipation. EXAM: ABDOMEN - 1 VIEW COMPARISON:  Abdominal CT 03/12/2023. FINDINGS: 0620 hours. The bowel gas pattern is nonobstructive. There is mildly prominent stool in the distal colon including the rectum, slightly improved from recent CT. There are probable bowel anastomosis clips in the right mid abdomen as well as cholecystectomy clips. No suspicious abdominal calcifications. Thoracolumbar scoliosis and previous left hip arthroplasty noted. IMPRESSION: Mildly prominent stool in the distal colon, slightly improved from recent CT. No evidence of bowel obstruction. Electronically Signed    By: Carey Bullocks M.D.   On: 03/13/2023 10:08   DG CHEST PORT 1 VIEW  Result Date: 03/13/2023 CLINICAL DATA:  Pneumonia. EXAM: PORTABLE CHEST 1 VIEW COMPARISON:  Radiographs 03/10/2023 and 02/26/2023. Abdominal CT 03/12/2023. FINDINGS: 0617 hours. The heart size and mediastinal contours are stable with mild cardiomegaly and aortic atherosclerosis. There are lower lung volumes with increasing patchy left basilar airspace disease. Small bilateral pleural effusions are present. No edema or pneumothorax. The bones appear unchanged. Telemetry leads overlie the chest. IMPRESSION: Lower lung volumes with increasing patchy left basilar airspace disease, suspicious for pneumonia. Small bilateral pleural effusions. Electronically Signed   By: Carey Bullocks M.D.   On: 03/13/2023 10:06        Scheduled Meds:  sodium chloride   Intravenous Once   acetaminophen  650 mg Oral TID   ARIPiprazole  15 mg Oral Daily   benztropine  0.5 mg Oral BID   Chlorhexidine Gluconate Cloth  6 each Topical Q0600   Chlorhexidine Gluconate Cloth  6 each Topical Q0600   [START ON 03/19/2023] darbepoetin (ARANESP) injection - NON-DIALYSIS  100 mcg Subcutaneous Q Tue-1800   docusate  100 mg Oral BID   feeding supplement  1 Container Oral TID BM   Glycerin (Adult)  1 suppository Rectal Once   metoprolol tartrate  12.5 mg Oral BID   midodrine  10 mg Oral TID WC   mirtazapine  15 mg Oral QHS   multivitamin  1 tablet Oral QHS   mouth rinse  15 mL Mouth Rinse 4 times per day   pantoprazole (PROTONIX) IV  40 mg Intravenous Q12H   polyethylene glycol  17 g Oral BID   senna-docusate  1 tablet Oral BID  sevelamer carbonate  1,600 mg Oral TID WC   valproic acid  250 mg Oral TID WC   Continuous Infusions:  piperacillin-tazobactam (ZOSYN)  IV       LOS: 16 days    Time spent: 40 minutes    Ramiro Harvest, MD [facility]   To contact the attending provider between 7A-7P or the covering provider during after  hours 7P-7A, please log into the web site [email or web address] and access using universal  password for that web site. If you do not have the password, please call the hospital operator.  03/14/2023, 11:47 AM

## 2023-03-14 NOTE — Progress Notes (Addendum)
[facility] Progress Note  CC:  Rectal bleeding, constipation     Subjective: RN stated patient passed a large amount of solid then loose stool overnight after SMOG enema given last evening. No bloody stools. No BM today. Water enema ordered, not yet given. Patient took her pills today and drank 2 cans of ensure. No family at the bedside.    Objective:  Vital signs in last 24 hours: Temp:  [98 F (36.7 C)-99.3 F (37.4 C)] 99.3 F (37.4 C) (06/20 0729) Pulse Rate:  [98-105] 98 (06/20 0451) Resp:  [17-29] 20 (06/20 0729) BP: (88-126)/(53-66) 99/53 (06/20 0451) SpO2:  [95 %-98 %] 97 % (06/20 0729) Weight:  [46.9 kg] 46.9 kg (06/20 0500) Last BM Date : 03/13/23 General: 72 year old female awake in NAD.  Heart: RRR, no murmur.  Pulm: Breath sounds clear, decreased in the bases.  Abdomen: Soft, nondistended. Nontender. BS x 4 quadrants.  Extremities:  Without edema. Neurologic:  Awake, follows commands. Answers ya to all questions.  Psych:  Alert and cooperative. Calm. Normal mood and affect.  Intake/Output from previous day: 06/19 0701 - 06/20 0700 In: 150 [IV Piggyback:150] Out: 1 [Stool:1] Intake/Output this shift: No intake/output data recorded.  Lab Results: Recent Labs    03/12/23 0150 03/13/23 0127 03/14/23 0045  WBC 14.3* 14.9* 12.1*  HGB 8.2* 7.7* 7.3*  HCT 25.9* 23.8* 22.5*  PLT 297 367 351   BMET Recent Labs    03/12/23 0105 03/13/23 0127 03/14/23 0950  NA 135 133* 138  K 4.5 4.7 4.1  CL 94* 92* 91*  CO2 24 26 25   GLUCOSE 81 98 123*  BUN 27* 50* 29*  CREATININE 3.55* 5.21* 3.77*  CALCIUM 10.4* 10.5* 10.4*   LFT Recent Labs    03/13/23 0127 03/14/23 0950  PROT 6.7  --   ALBUMIN 2.5* 2.3*  AST 21  --   ALT 10  --   ALKPHOS 61  --   BILITOT 0.5  --    PT/INR No results for input(s): "LABPROT", "INR" in the last 72 hours. Hepatitis Panel No results for input(s): "HEPBSAG", "HCVAB", "HEPAIGM", "HEPBIGM" in the last 72  hours.  DG HIP UNILAT WITH PELVIS 2-3 VIEWS LEFT  Result Date: 03/14/2023 CLINICAL DATA:  Evaluate for hip fracture. EXAM: DG HIP (WITH OR WITHOUT PELVIS) 2-3V LEFT COMPARISON:  CT AP 03/12/2023 FINDINGS: Status post left hip arthroplasty. No signs of periprosthetic fracture or subluxation. Right hip is intact and located. No signs of pelvic fracture or diastasis. IMPRESSION: Status post left hip arthroplasty. No acute findings. Electronically Signed   By: Signa Kell M.D.   On: 03/14/2023 13:11   DG Abd 1 View  Result Date: 03/13/2023 CLINICAL DATA:  Pneumonia.  Constipation. EXAM: ABDOMEN - 1 VIEW COMPARISON:  Abdominal CT 03/12/2023. FINDINGS: 0620 hours. The bowel gas pattern is nonobstructive. There is mildly prominent stool in the distal colon including the rectum, slightly improved from recent CT. There are probable bowel anastomosis clips in the right mid abdomen as well as cholecystectomy clips. No suspicious abdominal calcifications. Thoracolumbar scoliosis and previous left hip arthroplasty noted. IMPRESSION: Mildly prominent stool in the distal colon, slightly improved from recent CT. No evidence of bowel obstruction. Electronically Signed   By: Carey Bullocks M.D.   On: 03/13/2023 10:08   DG CHEST PORT 1 VIEW  Result Date: 03/13/2023 CLINICAL DATA:  Pneumonia. EXAM: PORTABLE CHEST 1 VIEW COMPARISON:  Radiographs 03/10/2023 and 02/26/2023. Abdominal CT 03/12/2023.  FINDINGS: 0617 hours. The heart size and mediastinal contours are stable with mild cardiomegaly and aortic atherosclerosis. There are lower lung volumes with increasing patchy left basilar airspace disease. Small bilateral pleural effusions are present. No edema or pneumothorax. The bones appear unchanged. Telemetry leads overlie the chest. IMPRESSION: Lower lung volumes with increasing patchy left basilar airspace disease, suspicious for pneumonia. Small bilateral pleural effusions. Electronically Signed   By: Carey Bullocks  M.D.   On: 03/13/2023 10:06    Assessment / Plan:  Hematochezia: Initially seen overnight on 03/11/2023, the next day she was disimpacted with a large ball of hard stool removed from the rectum. CT shows significant stool burden, particularly in the rectum, with surrounding proctitis. Suspect that the inflammation in her rectum is due to stercoral colitis.  Received half movi prep 6/18. Abd xray today showed prominent stool in the distal colon. Received a milk/molasses enema and a SMOG enema yesterday, passed a large amount of solid then loose stool. No bloody stools.  -Continue Miralax bid, Senokot one tab bid -Await further recommendations per Dr. Leonides Schanz -Consider Palliative care consult to establish goals of care    Left femoral neck fracture   ESRD on HD every M-W-F   Leukocytosis. Possible pneumonia. WBC 14.9. Chest xray today showed lower lung volumes with increasing patchy left basilar airspace disease, suspicious for pneumonia. Small bilateral pleural effusions.   Anemia of chronic disease compounded by postoperative anemia. Transfused in unit of PRBCs on 6/14. Hg 8.2 -> 7.7 -> today Hg 7.3.  Iron 15.  Saturation ratio is 13.  TIBC 116.  B12 level 1108.  Folate 26.1. -FOBT -Transfuse for Hg < 7   Malnutrition -Diet as tolerated, Ensure 2 cans daily  -May require tube feedings    Schizoaffective disorder/bipolar type with uncontrollable agitation intermittently   Active Problems:   ESRD on hemodialysis (HCC)   Schizoaffective disorder, bipolar type (HCC)   Hip fracture, left (HCC)   Malnutrition of moderate degree   Hematochezia   Constipation   Gastroesophageal reflux disease   BRBPR (bright red blood per rectum)   Leukocytosis   Sinus tachycardia   Anemia due to chronic kidney disease, on chronic dialysis (HCC)     LOS: 16 days   Madison [name]  03/14/2023, 2:40PM

## 2023-03-15 ENCOUNTER — Inpatient Hospital Stay (HOSPITAL_COMMUNITY): Payer: Medicare Other | Admitting: Anesthesiology

## 2023-03-15 ENCOUNTER — Encounter (HOSPITAL_COMMUNITY): Admission: AD | Disposition: E | Payer: Self-pay | Source: Other Acute Inpatient Hospital | Attending: Internal Medicine

## 2023-03-15 ENCOUNTER — Other Ambulatory Visit: Payer: Self-pay

## 2023-03-15 ENCOUNTER — Encounter (HOSPITAL_COMMUNITY): Payer: Self-pay | Admitting: Internal Medicine

## 2023-03-15 DIAGNOSIS — F319 Bipolar disorder, unspecified: Secondary | ICD-10-CM

## 2023-03-15 DIAGNOSIS — I252 Old myocardial infarction: Secondary | ICD-10-CM | POA: Diagnosis not present

## 2023-03-15 DIAGNOSIS — K5120 Ulcerative (chronic) proctitis without complications: Secondary | ICD-10-CM | POA: Diagnosis not present

## 2023-03-15 DIAGNOSIS — K6289 Other specified diseases of anus and rectum: Secondary | ICD-10-CM

## 2023-03-15 DIAGNOSIS — D126 Benign neoplasm of colon, unspecified: Secondary | ICD-10-CM | POA: Diagnosis not present

## 2023-03-15 DIAGNOSIS — D128 Benign neoplasm of rectum: Secondary | ICD-10-CM | POA: Diagnosis not present

## 2023-03-15 DIAGNOSIS — K626 Ulcer of anus and rectum: Secondary | ICD-10-CM

## 2023-03-15 DIAGNOSIS — F259 Schizoaffective disorder, unspecified: Secondary | ICD-10-CM | POA: Diagnosis not present

## 2023-03-15 DIAGNOSIS — K921 Melena: Secondary | ICD-10-CM | POA: Diagnosis not present

## 2023-03-15 HISTORY — PX: FLEXIBLE SIGMOIDOSCOPY: SHX5431

## 2023-03-15 HISTORY — PX: BIOPSY: SHX5522

## 2023-03-15 LAB — RENAL FUNCTION PANEL
Albumin: 2.3 g/dL — ABNORMAL LOW (ref 3.5–5.0)
Anion gap: 17 — ABNORMAL HIGH (ref 5–15)
BUN: 46 mg/dL — ABNORMAL HIGH (ref 8–23)
CO2: 26 mmol/L (ref 22–32)
Calcium: 10.4 mg/dL — ABNORMAL HIGH (ref 8.9–10.3)
Chloride: 92 mmol/L — ABNORMAL LOW (ref 98–111)
Creatinine, Ser: 5.23 mg/dL — ABNORMAL HIGH (ref 0.44–1.00)
GFR, Estimated: 8 mL/min — ABNORMAL LOW (ref >60)
Glucose, Bld: 104 mg/dL — ABNORMAL HIGH (ref 70–99)
Phosphorus: 4.9 mg/dL — ABNORMAL HIGH (ref 2.5–4.6)
Potassium: 4.8 mmol/L (ref 3.5–5.1)
Sodium: 135 mmol/L (ref 135–145)

## 2023-03-15 LAB — AMMONIA: Ammonia: 15 umol/L (ref 9–35)

## 2023-03-15 LAB — DIFFERENTIAL
Abs Immature Granulocytes: 0.15 10*3/uL — ABNORMAL HIGH (ref 0.00–0.07)
Basophils Absolute: 0.1 10*3/uL (ref 0.0–0.1)
Basophils Relative: 1 %
Eosinophils Absolute: 0.2 10*3/uL (ref 0.0–0.5)
Eosinophils Relative: 2 %
Immature Granulocytes: 1 %
Lymphocytes Relative: 11 %
Lymphs Abs: 1.2 10*3/uL (ref 0.7–4.0)
Monocytes Absolute: 0.7 10*3/uL (ref 0.1–1.0)
Monocytes Relative: 6 %
Neutro Abs: 8.8 10*3/uL — ABNORMAL HIGH (ref 1.7–7.7)
Neutrophils Relative %: 79 %

## 2023-03-15 LAB — TSH: TSH: 5.299 u[IU]/mL — ABNORMAL HIGH (ref 0.350–4.500)

## 2023-03-15 LAB — CBC
HCT: 27.2 % — ABNORMAL LOW (ref 36.0–46.0)
Hemoglobin: 8.2 g/dL — ABNORMAL LOW (ref 12.0–15.0)
MCH: 28.3 pg (ref 26.0–34.0)
MCHC: 30.1 g/dL (ref 30.0–36.0)
MCV: 93.8 fL (ref 80.0–100.0)
Platelets: 429 10*3/uL — ABNORMAL HIGH (ref 150–400)
RBC: 2.9 MIL/uL — ABNORMAL LOW (ref 3.87–5.11)
RDW: 19.2 % — ABNORMAL HIGH (ref 11.5–15.5)
WBC: 11.2 10*3/uL — ABNORMAL HIGH (ref 4.0–10.5)
nRBC: 0 % (ref 0.0–0.2)

## 2023-03-15 LAB — CK: Total CK: 17 U/L — ABNORMAL LOW (ref 38–234)

## 2023-03-15 LAB — ERYTHROPOIETIN: Erythropoietin: 138.2 m[IU]/mL — ABNORMAL HIGH (ref 2.6–18.5)

## 2023-03-15 LAB — CULTURE, BLOOD (ROUTINE X 2)

## 2023-03-15 LAB — VALPROIC ACID LEVEL: Valproic Acid Lvl: <10 ug/mL — ABNORMAL LOW (ref 50.0–100.0)

## 2023-03-15 SURGERY — SIGMOIDOSCOPY, FLEXIBLE
Anesthesia: Monitor Anesthesia Care

## 2023-03-15 MED ORDER — BISACODYL 10 MG RE SUPP
10.0000 mg | Freq: Every day | RECTAL | Status: DC
  Administered 2023-03-16 – 2023-03-21 (×2): 10 mg via RECTAL
  Filled 2023-03-15 (×5): qty 1

## 2023-03-15 MED ORDER — PROPOFOL 500 MG/50ML IV EMUL
INTRAVENOUS | Status: DC | PRN
  Administered 2023-03-15: 100 ug/kg/min via INTRAVENOUS

## 2023-03-15 MED ORDER — SORBITOL 70 % SOLN
960.0000 mL | TOPICAL_OIL | Freq: Once | ORAL | Status: AC
  Administered 2023-03-16: 960 mL via RECTAL
  Filled 2023-03-15 (×2): qty 240

## 2023-03-15 MED ORDER — SODIUM CHLORIDE 0.9 % IV SOLN: INTRAVENOUS | Status: DC

## 2023-03-15 MED ORDER — PHENYLEPHRINE 80 MCG/ML (10ML) SYRINGE FOR IV PUSH (FOR BLOOD PRESSURE SUPPORT)
PREFILLED_SYRINGE | INTRAVENOUS | Status: DC | PRN
  Administered 2023-03-15 (×4): 80 ug via INTRAVENOUS

## 2023-03-15 NOTE — Progress Notes (Signed)
[name] NEPHROLOGY PROGRESS NOTE  Assessment/ Plan:  # s/p hemiarthroplasty for left hip fracture: plan for SNF, rehab.   # ESRD: On HD per MWF schedule (previously [facility] in [place]). -As per note she tolerated HD and declined report she was altered and is still not appropriate for outpatient HD.  Plan for dialysis today.  We will monitor if she can tolerate HD in chair or recliner.  She looks frail and deconditioning on exam.  Discussed with renal navigator.  # Anemia CKD: Very high ferritin level.  Continue Aranesp and monitor hemoglobin.  Transfuse as needed.  # CKD-MBD: hyperphosphatemia - on sevelamer.  Will need a reduced ca bath for now if able (unfortunately we are limited inpatient).  # Hypertension: Blood pressure low, on metoprolol.   #Bright red blood per rectum, proctitis. - Note that GI has been consulted.  Antibiotics per primary team discretion   # Disposition - she is changing SNF's and will need a new outpatient dialysis unit.  (She is not able to be discharged without a new outpatient HD unit confirmed).  Note that at this time she is not acceptable for outpatient HD. She needs to be able to communicate her needs in the outpatient dialysis setting and able to sit on recliner or chair for HD.  Subjective: Seen and examined at bedside.  Patient is alert awake but feels weak.  Review of system limited because of altered mental status. Objective Vital signs in last 24 hours: Vitals:   03/14/23 1523 03/14/23 1934 03/10/2023 0400 03/19/2023 0813  BP: (!) 119/54 (!) 92/56 (!) 97/54 101/66  Pulse:  98 89 96  Resp: 16 18    Temp: 98.6 F (37 C) 99.3 F (37.4 C) 98.9 F (37.2 C) 98.8 F (37.1 C)  TempSrc:  Oral Axillary Oral  SpO2:  98% 96% 98%  Weight:      Height:       Weight change:   Intake/Output Summary (Last 24 hours) at 03/09/2023 1014 Last data filed at 03/05/2023 0405 Gross per 24 hour  Intake 290 ml  Output 0 ml  Net 290  ml       Labs: RENAL PANEL Recent Labs  Lab 03/09/23 0158 03/10/23 0110 03/12/23 0105 03/13/23 0127 03/14/23 0950  NA 135 132* 135 133* 138  K 4.3 4.9 4.5 4.7 4.1  CL 92* 89* 94* 92* 91*  CO2 27 26 24 26 25   GLUCOSE 105* 103* 81 98 123*  BUN 34* 56* 27* 50* 29*  CREATININE 4.34* 6.11* 3.55* 5.21* 3.77*  CALCIUM 10.0 10.1 10.4* 10.5* 10.4*  MG 1.8 1.8 1.7 1.9  --   PHOS 4.7* 5.7* 4.4 4.4 4.2  ALBUMIN 2.4* 2.3* 2.8* 2.5* 2.3*    Liver Function Tests: Recent Labs  Lab 03/10/23 0110 03/12/23 0105 03/13/23 0127 03/14/23 0950  AST 21 19 21   --   ALT 6 7 10   --   ALKPHOS 58 54 61  --   BILITOT 0.6 0.8 0.5  --   PROT 6.5 7.0 6.7  --   ALBUMIN 2.3* 2.8* 2.5* 2.3*   No results for input(s): "LIPASE", "AMYLASE" in the last 168 hours. No results for input(s): "AMMONIA" in the last 168 hours. CBC: Recent Labs    03/10/23 0110 03/11/23 0445 03/12/23 0150 03/13/23 0127 03/14/23 0045  HGB 9.0* 9.2* 8.2* 7.7* 7.3*  MCV 89.5 90.7 89.3 91.2 87.9  VITAMINB12  --   --   --   --  1,108*  FOLATE  --   --   --   --  26.1  FERRITIN  --   --   --   --  4,206*  TIBC  --   --   --   --  116*  IRON  --   --   --   --  15*  RETICCTPCT  --   --   --   --  0.9    Cardiac Enzymes: No results for input(s): "CKTOTAL", "CKMB", "CKMBINDEX", "TROPONINI" in the last 168 hours. CBG: No results for input(s): "GLUCAP" in the last 168 hours.  Iron Studies:  Recent Labs    03/14/23 0045  IRON 15*  TIBC 116*  FERRITIN 4,206*   Studies/Results: DG HIP UNILAT WITH PELVIS 2-3 VIEWS LEFT  Result Date: 03/14/2023 CLINICAL DATA:  Evaluate for hip fracture. EXAM: DG HIP (WITH OR WITHOUT PELVIS) 2-3V LEFT COMPARISON:  CT AP 03/12/2023 FINDINGS: Status post left hip arthroplasty. No signs of periprosthetic fracture or subluxation. Right hip is intact and located. No signs of pelvic fracture or diastasis. IMPRESSION: Status post left hip arthroplasty. No acute findings. Electronically Signed    By: Signa Kell M.D.   On: 03/14/2023 13:11    Medications: Infusions:  piperacillin-tazobactam (ZOSYN)  IV 2.25 g (03/10/2023 1610)    Scheduled Medications:  acetaminophen  650 mg Oral TID   ARIPiprazole  15 mg Oral Daily   benztropine  0.5 mg Oral BID   Chlorhexidine Gluconate Cloth  6 each Topical Q0600   [START ON 03/19/2023] darbepoetin (ARANESP) injection - NON-DIALYSIS  100 mcg Subcutaneous Q Tue-1800   docusate  100 mg Oral BID   feeding supplement  1 Container Oral TID BM   metoprolol tartrate  12.5 mg Oral BID   midodrine  10 mg Oral TID WC   mirtazapine  15 mg Oral QHS   multivitamin  1 tablet Oral QHS   mouth rinse  15 mL Mouth Rinse 4 times per day   pantoprazole (PROTONIX) IV  40 mg Intravenous Q12H   polyethylene glycol  17 g Oral BID   senna-docusate  1 tablet Oral BID   sevelamer carbonate  1,600 mg Oral TID WC   valproic acid  250 mg Oral TID WC    have reviewed scheduled and prn medications.  Physical Exam: General: Elderly looking female, lying on bed. Heart:RRR, s1s2 nl Lungs:clear b/l, no crackle Abdomen:soft, Non-tender, non-distended Extremities:No edema Dialysis Access: Left upper extremity AV fistula+  Madison [name] Madison [name] 03/08/2023,10:14 AM  LOS: 17 days

## 2023-03-15 NOTE — TOC Progression Note (Signed)
Transition of Care [facility]) - Progression Note    Patient Details  Name: Madison [name] MRN: [id number] Date of Birth: 1950/12/08  [facility]) CM/SW Contact  Lorri Frederick, LCSW Phone Number: 03/15/2023, 8:30 AM  Clinical Narrative:   Per nephro, pt not able to receive outpt HD currently.  Pt does have SN[facility] bed [facility]  TOC will continue to follow.     Expected Discharge Plan: Skilled Nursing Facility Barriers to Discharge: Continued Medical Work up, SNF Pending bed offer  Expected Discharge Plan and Services In-house Referral: Clinical Social Work   Post Acute Care Choice: Skilled Nursing Facility Living arrangements for the past 2 months: Skilled Nursing Facility ([facility] term [facility])                                       Social Determinants of Health (SDOH) Interventions SDOH Screenings   Alcohol Screen: Low Risk  (12/25/2018)  Tobacco Use: Low Risk  (03/04/2023)    Readmission Risk Interventions    12/27/2022    8:27 AM  Readmission Risk Prevention Plan  Transportation Screening Complete  HRI or Home Care Consult Complete  Social Work Consult for Recovery Care Planning/Counseling Complete  Palliative Care Screening Not Applicable  Medication Review [employer]) Complete

## 2023-03-15 NOTE — Interval H&P Note (Signed)
History and Physical Interval Note:  03/15/2023 11:51 AM  Madison [name]  has presented today for surgery, with the diagnosis of Proctitis on CT imaging.  The various methods of treatment have been discussed with the patient and family. After consideration of risks, benefits and other options for treatment, the patient has consented to  Procedure(s): FLEXIBLE SIGMOIDOSCOPY (N/A) as a surgical intervention.  The patient's history has been reviewed, patient examined, no change in status, stable for surgery.  I have reviewed the patient's chart and labs.  Questions were answered to the patient's satisfaction.     Imogene Burn

## 2023-03-15 NOTE — Progress Notes (Signed)
Spoke to staff on inpt HD unit. Pt is not receiving HD in the chair today. Will f/u on Monday to see if pt will attempt HD in the chair for Monday's treatment.  Olivia Canter Renal Navigator [phone]

## 2023-03-15 NOTE — Anesthesia Postprocedure Evaluation (Signed)
Anesthesia Post Note  Patient: DIMPLE BASTYR  Procedure(s) Performed: FLEXIBLE SIGMOIDOSCOPY BIOPSY     Patient location during evaluation: PACU Anesthesia Type: MAC Level of consciousness: awake Pain management: pain level controlled Vital Signs Assessment: post-procedure vital signs reviewed and stable Respiratory status: spontaneous breathing, nonlabored ventilation and respiratory function stable Cardiovascular status: stable and blood pressure returned to baseline Postop Assessment: no apparent nausea or vomiting Anesthetic complications: no   No notable events documented.  Last Vitals:  Vitals:   03/15/23 1250 03/15/23 1300  BP: (!) 107/57 (!) 107/58  Pulse: 88 86  Resp: 20 17  Temp:    SpO2: 94% 93%    Last Pain:  Vitals:   03/15/23 1300  TempSrc:   PainSc: Asleep                 Linton Rump

## 2023-03-15 NOTE — Op Note (Addendum)
[facility] Patient Name: Madison [name] Procedure Date : 03/10/2023 MRN: [id number] Attending MD: Particia Lather , , [id number] Date of Birth: 1951/06/15 CSN: [id number] Age: 72 Admit Type: Inpatient Procedure:                Flexible Sigmoidoscopy Indications:              Hematochezia, Abnormal CT of the GI tract Providers:                Madelyn Brunner" Duanne Guess, RN, Martha Clan, RN, Leanne Lovely, Technician, Baker Pierini, CRNA Referring MD:             Gwenyth Bouillon Medicines:                Monitored Anesthesia Care Complications:            No immediate complications. Estimated Blood Loss:     Estimated blood loss was minimal. Procedure:                Pre-Anesthesia Assessment:                           - Prior to the procedure, a History and Physical                            was performed, and patient medications and                            allergies were reviewed. The patient's tolerance of                            previous anesthesia was also reviewed. The risks                            and benefits of the procedure and the sedation                            options and risks were discussed with the patient.                            All questions were answered, and informed consent                            was obtained. Prior Anticoagulants: The patient has                            taken no anticoagulant or antiplatelet agents. ASA                            Grade Assessment: III - A patient with severe  systemic disease. After reviewing the risks and                            benefits, the patient was deemed in satisfactory                            condition to undergo the procedure.                           After obtaining informed consent, the scope was                            passed under direct vision. The [id number]-[id number] ([id number])                             Olympus endoscope was introduced through the anus                            and advanced to the the rectosigmoid junction. The                            flexible sigmoidoscopy was accomplished without                            difficulty. The patient tolerated the procedure                            well. Rectal retroflexion was not performed due to                            rectal ulcers discovered Scope In: Scope Out: Findings:      A large amount of solid stool was found in the rectum, making       visualization difficult. Partial fecal disimpaction was able to be       accomplished, but this was limited by patient's supine positioning (due       to recent left hip fracture).      A 15 mm polyp was found in the rectum. The polyp was sessile. Biopsies       were taken with a cold forceps for histology.      Multiple ulcers were found in the rectum. The largest ulcer was 16 mm in       diameter. No bleeding was present. No stigmata of recent bleeding were       seen. Small biopsies were taken with a cold forceps for histology. Impression:               - Stool in the rectum. Partially cleared.                           - One 15 mm polyp in the rectum. Biopsied.                           - Multiple ulcers in the rectum. Biopsied. Recommendation:           - Return patient to hospital ward for ongoing care.                           -  It is suspected that the patient's recent                            hematochezia was due to stercoral ulcers. Exam was                            slightly limited due to presence of stool burden,                            but there were no obvious signs of malignancy.                           - Recommend continued bowel regimen in order to                            avoid constipation, which will worsen stercoral                            ulcers.                           - Await pathology results.                            - Patient will need to follow up with [facility]                            [facility] in [place] for consideration of                            EGD/colonoscopy for evaluation of anemia and                            removal of colon polyps in the future (once she has                            recovered from her recent hip fracture). Patient's                            sister prefers for her to be seen [place]                            because it is closer to the patient's facility.                           - The findings and recommendations were discussed                            with the patient's sister. Procedure Code(s):        --- Professional ---                           [phone], 52, Sigmoidoscopy, flexible; with biopsy,  single or multiple Diagnosis Code(s):        --- Professional ---                           D12.8, Benign neoplasm of rectum                           K62.6, Ulcer of anus and rectum                           K92.1, Melena (includes Hematochezia)                           R93.3, Abnormal findings on diagnostic imaging of                            other parts of digestive tract CPT copyright 2022 [employer]. All rights reserved. The codes documented in this report are preliminary and upon coder review may  be revised to meet current compliance requirements. Dr Particia Lather "Alan Ripper" Leonides Schanz,  03/21/2023 12:44:04 PM Number of Addenda: 0

## 2023-03-15 NOTE — Progress Notes (Signed)
PROGRESS NOTE    Madison [name]  ZOX:[id number] DOB: 1950/10/11 DOA: 2023/03/17 PCP: Charlynne Pander, MD    No chief complaint on file.   Brief Narrative:  The patient is a 72 year old SNF resident with past medical history significant for but limited to ESRD on hemodialysis Monday Wednesday Friday, hypertension, history of schizophrenia versus bipolar disorder as well as other comorbidities who presented to the [facility] ER after a fall which occurred multiple days prior.  X-rays at [facility] ER revealed a left femoral fracture and she was transferred to [facility] given that she needed a combination of the orthopedist as well as inpatient dialysis.  She underwent unipolar Hemi arthroplasty of the left hip on 03/07/2023 and has been deemed stable from a orthopedic standpoint.  She had issues with her dialysis catheter which has not been fixed and she is getting dialysis.  She was extremely agitated and hysterically crying early on during the hospitalization and remained emotionally labile and tearful however was calmer.   We were anticipating discharging back to SNF with resumption of her normal dialysis session on Friday, 03/08/2023 however a barrier to discharge was that the patient will need to be able to tolerate sitting in a chair for 4 to 5 hours to be appropriate for outpatient HD for discharge.  She was able to dialyze in the chair early on during the hospitalization, appropriately and had no issues with that but heart rate remained elevated in the setting of pain and agitation so she was initiated on a beta-blocker and given some pain medication.    Heart rate was improved however she now has a slight leukocytosis and after repeat is trending down.  TOC attempted to call the facility and got an response so unfortunately could not discharge given that no one at the bedside picked up.  Case worker is informed that the patient's legal guardian was unhappy with the SNF choice and requested to change her  SNF placement to [facility].  [facility] is able to take the patient at their facility  however because of the symptoms changed her dialysis center will need to be moved.     CBC continue to further worsen as she had bright red blood per rectum and was found to be impacted.  Having large stool ball and her bowel regimen has been escalated and obtained CT scan.  GI was consulted given her hemoglobin and bleeding and has bleeding noted to be subsiding, GI not recommending endoscopic procedures at this time.  GI feels patient bleeding likely secondary to inflammation in the rectum due to stercoral colitis, and the meds recommended for patient's constipation a few no further additional bleeding at this time..  Will need to continue monitor carefully.     Assessment & Plan:   Principal Problem:   ESRD on hemodialysis (HCC) Active Problems:   Schizoaffective disorder, bipolar type (HCC)   Hip fracture, left (HCC)   Malnutrition of moderate degree   Hematochezia   Constipation   Gastroesophageal reflux disease   BRBPR (bright red blood per rectum)   Leukocytosis   Sinus tachycardia   Anemia due to chronic kidney disease, on chronic dialysis (HCC)  #1 left femoral neck fracture -Secondary to mechanical fall. -Patient seen in consultation by orthopedics underwent unipolar hemiarthroplasty of the left hip 02/27/2023 without any significant complications. -Continue current pain regimen of oxycodone 5 to 10 mg every 4 hours as needed moderate pain, acetaminophen 650 mg 3 times daily, fentanyl 12.5 mg  every 2 hours as needed for severe pain. -Orthopedics recommending WBAT left lower extremity with assistance and posterior hip precautions for 12 weeks with daily wound care as needed and DVT prophylaxis with subcu heparin x 28 days. -Continue current bowel regimen. -Outpatient follow-up with orthopedic surgery 10 days postdischarge.  2.  Schizoaffective disorder/bipolar type with uncontrollable  crying and agitation intermittently -Continue home regimen Abilify, Depakote, Cogentin.  -Patient noted to have been extremely agitated and anxious a few days ago however noted to be drowsy on 03/13/2023, alert 03/14/2023 however tearful and noted to have intermittent agitation.   -Patient also with some incomprehensible speech.  Patient difficult to redirect. -RN concerned about patient's inability to swallow, SLP evaluated patient patient cleared for regular diet. -Continue mirtazapine 15 mg nightly. -Haldol as needed. -Continue treatment for probable pneumonia.   -Psychiatry consulted for further management of schizoaffective disorder/bipolar disorder with bouts of uncontrollable crying and intermittent agitation.   -May need adjustments to medications which we will defer to psychiatry.   3.  ESRD on HD Monday Wednesday Friday/hyperphosphatemia improved with dialysis/elevated anion gap -Patient noted with ongoing issues with dialysis cannulation which has been corrected post ultrasound-guided access of left upper extremity fistula for balloon angioplasty of outflow stenosis at the axillary vein with no residual on completion per IR 03/04/2023.. -Continue sevelamer 1600 mg 3 times daily, avoid nephrotoxic medications, contrast dyes, hypotension and dehydration to ensure adequate renal perfusion.  -Nephrology following and feel patient needs to be dialyzed in the chair prior to being discharged and needs improved mental status prior to being set up for outpatient dialysis.  -Patient for HD today. -Per nephrology.  4.  Sinus tachycardia -Noted in the setting of GI bleed constipation. -CT abdomen and pelvis with concerns for constipation, rectal wall thickening, perirectal stranding, fecal impaction. -Currently on beta-blocker with improvement with tachycardia.   -BP soft and as such we will discontinue beta-blocker.    5.  Constipation and fecal impaction -CT abdomen and pelvis done with large  amount of stool in the rectum, wall thickening in the rectum perirectal stranding, findings suggesting fecal impaction and possible proctitis. -No evidence of intestinal obstruction or pneumoperitoneum, no hydronephrosis. -Patient seen in consultation by GI. -Patient received milk of molasses enema with good results on 03/12/2023.   -Patient given another milk and molasses enema and a smog enema on 03/13/2023 with large amount of stool noted with no blood in it.  -Continue current bowel regimen of MiraLAX twice daily, Senokot-S twice daily. -Patient seen by GI and underwent flexible sigmoidoscopy 02/23/2023 which showed large amount of solid stool in the rectum, partially cleared, 15 mm polyp in the rectum biopsied, multiple ulcers in the rectum biopsied. -Post flex sigmoidoscopy GI recommending ongoing bowel regimen and enemas for further stool evacuation and to avoid constipation as this will worsen stercoral ulcers, outpatient follow-up with [facility] for consideration of EGD/colonoscopy for further evaluation of anemia and removal of colon polyps in the future once patient has recovered from her recent hip fracture.  -Smog enema today. -GI following.  6.??  Pneumonia -CT scan done showed small new patchy infiltrates in both lower lower lung fields suggest atelectasis/pneumonia.  Small bilateral pleural effusions more on the left.  Small pericardial effusion, coronary artery disease. -Patient noted to be drowsy not awake enough to use a flutter valve incentive spirometry on 03/13/2023. -Patient more alert today.. -SLP assessed patient placed on a regular diet. -Patient with a leukocytosis white count of 14.9, repeat  chest x-ray done 03/13/2023 with lower lung volumes with increasing patchy left basilar airspace disease, suspicious for pneumonia.  Small bilateral effusions. -Leukocytosis trending down. -Due to patient's drowsiness and leukocytosis patient started empirically on IV Zosyn  to complete a course of antibiotic treatment.    7.  Leukocytosis in the setting of BRBPR -Initially felt to be reactive but trended up and now in the setting of likely GI bleed and constipation concerns for stercoral colitis. -Repeat chest x-ray done 03/13/2023, with increasing infiltrate concerning for pneumonia. -Blood cultures ordered and negative x 5 days.  -CT abdomen and pelvis done with large amount of stool in the rectum, wall thickening in the rectum and perirectal stranding findings suggestive fecal impaction and possible proctitis.  No evidence of intestinal obstruction or pneumoperitoneum.  No hydronephrosis. -Continue empiric IV Zosyn.    8.  Anemia of chronic kidney disease/postop anemia/BRBPR and concern for GI bleed (Likely stercoral colitis) -Patient with no significant further bleeding. -Patient seen in consultation by GI who recommended adjusting bowel regimen and continue to monitor hemoglobin. -Patient with no known or significant further bloody bowel movements. -Hemoglobin slowly trending down currently at 8.2. -Patient receiving Darbepoetin alfa 60 mcg q. Mondays. -Patient seen in consultation by GI and patient underwent flexible sigmoidoscopy 02/27/2023 which showed large amount of solid stool in the rectum, partially cleared, 15 mm polyp in the rectum biopsied, multiple ulcers in the rectum biopsied. -Post flex sigmoidoscopy GI recommending ongoing bowel regimen and enemas for further stool evacuation and to avoid constipation as this will worsen stercoral ulcers, outpatient follow-up with [facility] for consideration of EGD/colonoscopy for further evaluation of anemia and removal of colon polyps in the future once patient has recovered from her recent hip fracture. -Follow H&H, transfusion threshold hemoglobin < 7.   9.  Nonsevere protein calorie malnutrition, moderate -Secondary to chronic illnesses (ESRD, schizoaffective disorder) as evidenced by moderate  fat depletion depletion. -Dietitian following, may need to engage other forms of nutrition if patient continues to refuse food and may consider consultation with palliative care.  10.  GERD -PPI.   11.  Hypoalbuminemia - Follow.  12.  Soft blood pressure -Patient noted to be on midodrine 15 mg twice daily prior to admission. -Midodrine resumed and dose uptitrated to 10 mg 3 times daily for better blood pressure control.  -Discontinue beta-blocker.   DVT prophylaxis: SCDs Code Status: Full Family Communication: Updated patient.  No family at bedside. Disposition: TBD.  Likely needs SNF when medically stable, and able to tolerate outpatient hemodialysis with improvement with mental status..  Status is: Inpatient Remains inpatient appropriate because: Severity of illness   Consultants:  Nephrology: Dr. Ronalee Belts 02/27/2023 Orthopedics: Dr. Carola Frost 02/23/2023 Interventional radiology: Dr. Loreta Ave 03/04/2023 Gastroenterology: Dr. Leonides Schanz 03/11/2023 Psychiatry Palliative care pending  Procedures:  CT abdomen pelvis 03/12/2023 Chest x-ray 03/13/2023, 03/10/2023, 03/13/2023 Abdominal x-rays 03/13/2023 Ultrasound-guided access left upper extremity fistula for balloon angioplasty of the outflow stenosis at the axillary vein with no residual on completion Through maintained: Per IR Dr. Loreta Ave 03/04/2023 Plain films of the left hip 03/12/2023 Unipolar hemiarthroplasty on the left hip: Per orthopedics: Dr. Carola Frost 02/27/2023 Flexible sigmoidoscopy: Per Dr. Leonides Schanz 03/24/2023  Antimicrobials:  Anti-infectives (From admission, onward)    Start     Dose/Rate Route Frequency Ordered Stop   03/14/23 1400  piperacillin-tazobactam (ZOSYN) IVPB 2.25 g        2.25 g 100 mL/hr over 30 Minutes Intravenous Every 8 hours 03/14/23 0723  03/13/23 1700  piperacillin-tazobactam (ZOSYN) IVPB 2.25 g  Status:  Discontinued        2.25 g 100 mL/hr over 30 Minutes Intravenous Every 6 hours 03/13/23 1607 03/14/23 0723    03/13/23 1645  piperacillin-tazobactam (ZOSYN) IVPB 3.375 g  Status:  Discontinued        3.375 g 12.5 mL/hr over 240 Minutes Intravenous Every 8 hours 03/13/23 1557 03/13/23 1607   03/03/2023 1430  ceFAZolin (ANCEF) IVPB 2g/100 mL premix  Status:  Discontinued        2 g 200 mL/hr over 30 Minutes Intravenous Every 6 hours 03/05/2023 1334 03/20/2023 1344   02/23/2023 1147  vancomycin (VANCOCIN) powder  Status:  Discontinued          As needed 03/10/2023 1147 03/13/2023 1216   03/06/2023 0830  ceFAZolin (ANCEF) IVPB 2g/100 mL premix        2 g 200 mL/hr over 30 Minutes Intravenous On call to O.R. 03/07/2023 0731 03/22/2023 1033   03/14/2023 0830  vancomycin (VANCOCIN) IVPB 1000 mg/200 mL premix        1,000 mg 200 mL/hr over 60 Minutes Intravenous On call to O.R. 02/23/2023 0731 03/18/2023 0858         Subjective: Laying in endoscopy suite.  Just finished flexible sigmoidoscopy.  Alert.  Some incomprehensible speech.   Objective: Vitals:   03/16/2023 1300 03/08/2023 1457 02/23/2023 1511 03/22/2023 1530  BP: (!) 107/58 (!) 99/57 (!) 102/56 91/67  Pulse: 86 75 80 78  Resp: 17 20 20 11   Temp:  (!) 97.5 F (36.4 C)    TempSrc:      SpO2: 93% 98% 96% 96%  Weight:  46.8 kg    Height:        Intake/Output Summary (Last 24 hours) at 03/05/2023 1551 Last data filed at 03/19/2023 1234 Gross per 24 hour  Intake 200 ml  Output 0 ml  Net 200 ml    Filed Weights   03/14/23 0500 03/09/2023 1123 03/21/2023 1457  Weight: 46.9 kg 46.9 kg 46.8 kg    Examination:  General exam: NAD.  Respiratory system: CTAB.  No wheezes, no crackles, no rhonchi.  Fair air movement.  Speaking full sentences.  Cardiovascular system: RRR no murmurs rubs or gallops.  No JVD.  No lower extremity edema.  Gastrointestinal system: Abdomen soft, nondistended, some tenderness to palpation in the lower abdominal region.  Positive bowel sounds.  No rebound.  No guarding.  Central nervous system: Alert.  Tearful.  Moving extremities spontaneously.   No focal neurological deficits. Extremities: Symmetric 5 x 5 power. Skin: No rashes, lesions or ulcers Psychiatry: Judgement and insight poor. Mood and affect unable to assess.      Data Reviewed: I have personally reviewed following labs and imaging studies  CBC: Recent Labs  Lab 03/09/23 0836 03/10/23 0110 03/11/23 0445 03/12/23 0150 03/13/23 0127 03/14/23 0045 03/08/2023 1030  WBC 11.8* 13.4* 21.3* 14.3* 14.9* 12.1* 11.2*  NEUTROABS 8.9* 10.3*  --  11.9* 11.9*  --  8.8*  HGB 9.6* 9.0* 9.2* 8.2* 7.7* 7.3* 8.2*  HCT 29.6* 28.2* 28.4* 25.9* 23.8* 22.5* 27.2*  MCV 90.8 89.5 90.7 89.3 91.2 87.9 93.8  PLT 289 297 323 297 367 351 429*     Basic Metabolic Panel: Recent Labs  Lab 03/09/23 0158 03/10/23 0110 03/12/23 0105 03/13/23 0127 03/14/23 0950 03/14/2023 1007  NA 135 132* 135 133* 138 135  K 4.3 4.9 4.5 4.7 4.1 4.8  CL 92* 89* 94* 92*  91* 92*  CO2 27 26 24 26 25 26   GLUCOSE 105* 103* 81 98 123* 104*  BUN 34* 56* 27* 50* 29* 46*  CREATININE 4.34* 6.11* 3.55* 5.21* 3.77* 5.23*  CALCIUM 10.0 10.1 10.4* 10.5* 10.4* 10.4*  MG 1.8 1.8 1.7 1.9  --   --   PHOS 4.7* 5.7* 4.4 4.4 4.2 4.9*     GFR: Estimated Creatinine Clearance: 7.1 mL/min (A) (by C-G formula based on SCr of 5.23 mg/dL (H)).  Liver Function Tests: Recent Labs  Lab 03/09/23 0158 03/10/23 0110 03/12/23 0105 03/13/23 0127 03/14/23 0950 03/01/2023 1007  AST 21 21 19 21   --   --   ALT 6 6 7 10   --   --   ALKPHOS 65 58 54 61  --   --   BILITOT 0.6 0.6 0.8 0.5  --   --   PROT 6.6 6.5 7.0 6.7  --   --   ALBUMIN 2.4* 2.3* 2.8* 2.5* 2.3* 2.3*     CBG: No results for input(s): "GLUCAP" in the last 168 hours.   Recent Results (from the past 240 hour(s))  Culture, blood (Routine X 2) w Reflex to ID Panel     Status: None   Collection Time: 03/10/23  9:51 AM   Specimen: BLOOD  Result Value Ref Range Status   Specimen Description BLOOD SITE NOT SPECIFIED  Final   Special Requests   Final    BOTTLES  DRAWN AEROBIC AND ANAEROBIC Blood Culture results may not be optimal due to an inadequate volume of blood received in culture bottles   Culture   Final    NO GROWTH 5 DAYS Performed at [facility], [address] [place], [place] [address]    Report Status 03/16/2023 FINAL  Final  Culture, blood (Routine X 2) w Reflex to ID Panel     Status: None   Collection Time: 03/10/23  9:51 AM   Specimen: BLOOD  Result Value Ref Range Status   Specimen Description BLOOD SITE NOT SPECIFIED  Final   Special Requests   Final    AEROBIC BOTTLE ONLY Blood Culture results may not be optimal due to an inadequate volume of blood received in culture bottles   Culture   Final    NO GROWTH 5 DAYS Performed at [facility], [address], [place], [place] [address]    Report Status 03/08/2023 FINAL  Final         Radiology Studies: DG HIP UNILAT WITH PELVIS 2-3 VIEWS LEFT  Result Date: 03/14/2023 CLINICAL DATA:  Evaluate for hip fracture. EXAM: DG HIP (WITH OR WITHOUT PELVIS) 2-3V LEFT COMPARISON:  CT AP 03/12/2023 FINDINGS: Status post left hip arthroplasty. No signs of periprosthetic fracture or subluxation. Right hip is intact and located. No signs of pelvic fracture or diastasis. IMPRESSION: Status post left hip arthroplasty. No acute findings. Electronically Signed   By: Signa Kell M.D.   On: 03/14/2023 13:11        Scheduled Meds:  acetaminophen  650 mg Oral TID   ARIPiprazole  15 mg Oral Daily   [START ON 03/16/2023] bisacodyl  10 mg Rectal Daily   Chlorhexidine Gluconate Cloth  6 each Topical Q0600   [START ON 03/19/2023] darbepoetin (ARANESP) injection - NON-DIALYSIS  100 mcg Subcutaneous Q Tue-1800   docusate  100 mg Oral BID   feeding supplement  1 Container Oral TID BM   metoprolol tartrate  12.5 mg Oral BID  midodrine  10 mg Oral TID WC   mirtazapine  15 mg Oral QHS   multivitamin  1 tablet Oral QHS   mouth rinse  15 mL Mouth Rinse 4 times per day   pantoprazole  (PROTONIX) IV  40 mg Intravenous Q12H   polyethylene glycol  17 g Oral BID   senna-docusate  1 tablet Oral BID   sevelamer carbonate  1,600 mg Oral TID WC   [START ON 03/16/2023] sorbitol, milk of mag, mineral oil, glycerin (SMOG) enema  960 mL Rectal Once   valproic acid  250 mg Oral TID WC   Continuous Infusions:  piperacillin-tazobactam (ZOSYN)  IV 2.25 g (03/24/2023 0923)     LOS: 17 days    Time spent: 35 minutes    Ramiro Harvest, MD [facility]   To contact the attending provider between 7A-7P or the covering provider during after hours 7P-7A, please log into the web site [email or web address] and access using universal [place] password for that web site. If you do not have the password, please call the hospital operator.  03/12/2023, 3:51 PM

## 2023-03-15 NOTE — Anesthesia Procedure Notes (Signed)
Procedure Name: MAC Date/Time: 03/15/2023 12:07 PM  Performed by: Colon Flattery, CRNAPre-anesthesia Checklist: Patient identified, Emergency Drugs available, Suction available and Patient being monitored Patient Re-evaluated:Patient Re-evaluated prior to induction Oxygen Delivery Method: Simple face mask Preoxygenation: Pre-oxygenation with 100% oxygen Placement Confirmation: positive ETCO2 Dental Injury: Teeth and Oropharynx as per pre-operative assessment

## 2023-03-15 NOTE — Consult Note (Signed)
Consultation Note Date: 03/21/2023   Patient Name: Madison [name]  DOB: Apr 30, 1951  MRN: [id number]  Age / Sex: 72 y.o., female  PCP: Charlynne Pander, MD Referring Physician: Rodolph Bong, MD  Reason for Consultation: {Reason for Consult:23484}  HPI/Patient Profile: 72 y.o. female  with past medical history of *** admitted on 03/22/2023 with ***.   Clinical Assessment and Goals of Care: ***  Primary Decision Maker {Primary Decision WUJWJ:19147}    SUMMARY OF RECOMMENDATIONS   ***  Code Status/Advance Care Planning: {Palliative Code status:23503}   Symptom Management:  ***  Palliative Prophylaxis:  {Palliative Prophylaxis:21015}  Additional Recommendations (Limitations, Scope, Preferences): {Recommended Scope and Preferences:21019}  Psycho-social/Spiritual:  Desire for further Chaplaincy support:{YES NO:22349} Additional Recommendations: {PAL SOCIAL:21064}  Prognosis:  {Palliative Care Prognosis:23504}  Discharge Planning: {Palliative dispostion:23505}      Primary Diagnoses: Present on Admission:  Schizoaffective disorder, bipolar type (HCC)  Hip fracture, left (HCC)   I have reviewed the medical record, interviewed the patient and family, and examined the patient. The following aspects are pertinent.  Past Medical History:  Diagnosis Date   Anxiety    Bipolar disorder (HCC)    ESRD (end stage renal disease) on dialysis (HCC)    Neuropathy    Schizophrenia (HCC)    Social History   Socioeconomic History   Marital status: Single    Spouse name: Not on file   Number of children: Not on file   Years of education: Not on file   Highest education level: Not on file  Occupational History   Not on file  Tobacco Use   Smoking status: Never   Smokeless tobacco: Never  Vaping Use   Vaping Use: Never used  Substance and Sexual Activity   Alcohol use: No   Drug use: No    Sexual activity: Not Currently    Birth control/protection: Surgical    Comment: hyst/tubal  Other Topics Concern   Not on file  Social History Narrative   Not on file   Social Determinants of Health   Financial Resource Strain: Not on file  Food Insecurity: Not on file  Transportation Needs: Not on file  Physical Activity: Not on file  Stress: Not on file  Social Connections: Not on file   Family History  Problem Relation Age of Onset   Alzheimer's disease Mother    Cancer Mother        pancreatic   Diabetes Mother    Prostate cancer Father    Kidney failure Sister    Breast cancer Neg Hx    Colon cancer Neg Hx    Scheduled Meds:  acetaminophen  650 mg Oral TID   ARIPiprazole  15 mg Oral Daily   [START ON 03/16/2023] bisacodyl  10 mg Rectal Daily   Chlorhexidine Gluconate Cloth  6 each Topical Q0600   [START ON 03/19/2023] darbepoetin (ARANESP) injection - NON-DIALYSIS  100 mcg Subcutaneous Q Tue-1800   docusate  100 mg Oral BID   feeding supplement  1 Container Oral  TID BM   midodrine  10 mg Oral TID WC   mirtazapine  15 mg Oral QHS   multivitamin  1 tablet Oral QHS   mouth rinse  15 mL Mouth Rinse 4 times per day   pantoprazole (PROTONIX) IV  40 mg Intravenous Q12H   polyethylene glycol  17 g Oral BID   senna-docusate  1 tablet Oral BID   sevelamer carbonate  1,600 mg Oral TID WC   [START ON 03/16/2023] sorbitol, milk of mag, mineral oil, glycerin (SMOG) enema  960 mL Rectal Once   valproic acid  250 mg Oral TID WC   Continuous Infusions:  piperacillin-tazobactam (ZOSYN)  IV 2.25 g (02/24/2023 0923)   PRN Meds:.fentaNYL (SUBLIMAZE) injection, haloperidol lactate, menthol-cetylpyridinium **OR** phenol, ondansetron **OR** ondansetron (ZOFRAN) IV, mouth rinse, oxyCODONE No Known Allergies Review of Systems  Physical Exam  Vital Signs: BP 91/67   Pulse 78   Temp (!) 97.5 F (36.4 C)   Resp 11   Ht 5\' 4"  (1.626 m)   Wt 46.8 kg   SpO2 96%   BMI 17.71 kg/m   Pain Scale: 0-10 POSS *See Group Information*: 2-Acceptable,Slightly drowsy, easily aroused Pain Score: 0-No pain   SpO2: SpO2: 96 % O2 Device:SpO2: 96 % O2 Flow Rate: .O2 Flow Rate (L/min): 2 L/min  IO: Intake/output summary:  Intake/Output Summary (Last 24 hours) at 03/14/2023 1604 Last data filed at 03/18/2023 1234 Gross per 24 hour  Intake 150 ml  Output 0 ml  Net 150 ml    LBM: Last BM Date : 03/02/2023 Baseline Weight: Weight: 49.4 kg Most recent weight: Weight: 46.8 kg     Palliative Assessment/Data:     Time In: *** Time Out: *** Time Total: *** Greater than 50%  of this time was spent counseling and coordinating care related to the above assessment and plan.  Signed by: Yong Channel, NP Palliative Medicine Team Pager # [phone] (M-F 8a-5p) Team Phone # [phone] (Nights/Weekends)

## 2023-03-15 NOTE — Progress Notes (Signed)
PT Cancellation Note  Patient Details Name: LARAYA PESTKA MRN: [id number] DOB: 09-09-1951   Cancelled Treatment:    Reason Eval/Treat Not Completed: Patient at procedure or test/unavailable. Pt off of the floor for an endoscopy. PT will continue to f/u with pt acutely as available and appropriate.    Alessandra Bevels Geet Hosking 03/15/2023, 12:37 PM

## 2023-03-15 NOTE — Plan of Care (Signed)
  Problem: Clinical Measurements: Goal: Ability to maintain clinical measurements within normal limits will improve Outcome: Progressing Goal: Will remain free from infection Outcome: Progressing Goal: Diagnostic test results will improve Outcome: Progressing Goal: Respiratory complications will improve Outcome: Progressing Goal: Cardiovascular complication will be avoided Outcome: Progressing   Problem: Activity: Goal: Risk for activity intolerance will decrease Outcome: Progressing   Problem: Nutrition: Goal: Adequate nutrition will be maintained Outcome: Progressing   Problem: Coping: Goal: Level of anxiety will decrease Outcome: Progressing   Problem: Elimination: Goal: Will not experience complications related to bowel motility Outcome: Progressing Goal: Will not experience complications related to urinary retention Outcome: Progressing   Problem: Pain Managment: Goal: General experience of comfort will improve Outcome: Progressing   Problem: Safety: Goal: Ability to remain free from injury will improve Outcome: Progressing   Problem: Skin Integrity: Goal: Risk for impaired skin integrity will decrease Outcome: Progressing   Problem: Activity: Goal: Ability to ambulate and perform ADLs will improve Outcome: Progressing   Problem: Clinical Measurements: Goal: Postoperative complications will be avoided or minimized Outcome: Progressing   Problem: Self-Concept: Goal: Ability to maintain and perform role responsibilities to the fullest extent possible will improve Outcome: Progressing   Problem: Pain Management: Goal: Pain level will decrease Outcome: Progressing

## 2023-03-15 NOTE — Progress Notes (Signed)
[facility]th[facility]edic Trauma Service Progress Note  Patient ID: JANKI DIKE MRN: [id number] DOB/AGE: 03-08-1951 72 y.o.  Subjective:  Ortho issues stable and unchanged    ROS  Objective:   VITALS:   Vitals:   03/14/23 1523 03/14/23 1934 March 23, 2023 0400 2023-03-23 0813  BP: (!) 119/54 (!) 92/56 (!) 97/54 101/66  Pulse:  98 89 96  Resp: 16 18    Temp: 98.6 F (37 C) 99.3 F (37.4 C) 98.9 F (37.2 C) 98.8 F (37.1 C)  TempSrc:  Oral Axillary Oral  SpO2:  98% 96% 98%  Weight:      Height:        Estimated body mass index is 17.75 kg/m as calculated from the following:   Height as of this encounter: 5\' 4"  (1.626 m).   Weight as of this encounter: 46.9 kg.   Intake/Output      06/20 0701 23-Mar-2023 0700 2023/03/23 0701 06/22 0700   P.O. 240    IV Piggyback 50    Total Intake(mL/kg) 290 (6.2)    Urine (mL/kg/hr) 0 (0)    Emesis/NG output 0    Other     Stool 0    Total Output 0    Net +290         Urine Occurrence 0 x    Stool Occurrence 1 x    Emesis Occurrence 0 x      LABS  No results found for this or any previous visit (from the past 24 hour(s)).   PHYSICAL EXAM:   Gen: in bed, sleeping  Ext:       Left Lower extremity              Dressing L hip clean, dry and intact   Incision w/o signs of infection              Distal motor and sensory functions grossly intact             No DCT              Compartments are soft              exam stable  Assessment/Plan: 15 Days Post-Op   Active Problems:   ESRD on hemodialysis (HCC)   Schizoaffective disorder, bipolar type (HCC)   Hip fracture, left (HCC)   Malnutrition of moderate degree   Hematochezia   Constipation   Gastroesophageal reflux disease   BRBPR (bright red blood per rectum)   Leukocytosis   Sinus tachycardia   Anemia due to chronic kidney disease, on chronic dialysis (HCC)   Anti-infectives (From admission, onward)     Start     Dose/Rate Route Frequency Ordered Stop   03/14/23 1400  piperacillin-tazobactam (ZOSYN) IVPB 2.25 g        2.25 g 100 mL/hr over 30 Minutes Intravenous Every 8 hours 03/14/23 0723     03/13/23 1700  piperacillin-tazobactam (ZOSYN) IVPB 2.25 g  Status:  Discontinued        2.25 g 100 mL/hr over 30 Minutes Intravenous Every 6 hours 03/13/23 1607 03/14/23 0723   03/13/23 1645  piperacillin-tazobactam (ZOSYN) IVPB 3.375 g  Status:  Discontinued        3.375 g 12.5 mL/hr over 240 Minutes Intravenous Every 8 hours 03/13/23 1557 03/13/23 1607  03/20/2023 1430  ceFAZolin (ANCEF) IVPB 2g/100 mL premix  Status:  Discontinued        2 g 200 mL/hr over 30 Minutes Intravenous Every 6 hours 03/10/2023 1334 03/19/2023 1344   03/08/2023 1147  vancomycin (VANCOCIN) powder  Status:  Discontinued          As needed 02/27/2023 1147 02/25/2023 1216   03/14/2023 0830  ceFAZolin (ANCEF) IVPB 2g/100 mL premix        2 g 200 mL/hr over 30 Minutes Intravenous On call to O.R. 03/02/2023 0731 03/04/2023 1033   03/05/2023 0830  vancomycin (VANCOCIN) IVPB 1000 mg/200 mL premix        1,000 mg 200 mL/hr over 60 Minutes Intravenous On call to O.R. 03/05/2023 1610 03/17/2023 9604     .  POD/HD#: 29  72 y/o female s/p fall with Left femoral neck fracture s/p Left hip hemiarthroplasty    -L femoral neck fracture s/p left hip hemiarthroplasty  Weightbearing WBAT L leg with assistance               ROM/Activity                         Posterior hip precautions x 12 weeks                Wound care                         Daily wound care as needed                          Clean with soap and water only    Dc sutures today or tomorrow    Can cover incision with mepilex or can leave open to air    PT and OT Ice prn    - Pain management:             Multimodal             Minimize narcotics     - Medical issues              Per primary and renal               - DVT/PE prophylaxis:             heparin has been dc'd  due to GI bleeding   At this point it is ok to not resume from an ortho standpoint               - ID:              Periop abx completed   - Metabolic Bone Disease:             Due to renal dz   - Dispo:             Therapies             Snf              Follow up with ortho 10-14 days   Mearl Latin, PA-C [phone] (C) 03/21/2023, 10:46 AM  [facility] [address] [place] [address] [phone] [phone] (F)    After 5pm and on the weekends please log on to Amion, go to orthopaedics and the look under the [facility] Call for the provider(s) on call. You can also call our office  at [phone] and then follow the prompts to be connected to the call team.  Patient ID: Hurman Horn, female   DOB: 1951/08/23, 72 y.o.   MRN: [id number]

## 2023-03-15 NOTE — Progress Notes (Signed)
PT Cancellation Note  Patient Details Name: Madison [name] MRN: [id number] DOB: 1951/04/22   Cancelled Treatment:    Reason Eval/Treat Not Completed: Patient at procedure or test/unavailable. Pt having bedside test at this time. PT will continue to f/u with pt and attempt later today if able.   Alessandra Bevels Joandy Burget 03/15/2023, 9:49 AM

## 2023-03-15 NOTE — Transfer of Care (Signed)
Immediate Anesthesia Transfer of Care Note  Patient: Madison [name]  Procedure(s) Performed: FLEXIBLE SIGMOIDOSCOPY BIOPSY  Patient Location: Endoscopy Unit  Anesthesia Type:MAC  Level of Consciousness: drowsy  Airway & Oxygen Therapy: Patient Spontanous Breathing and Patient connected to face mask oxygen  Post-op Assessment: Report given to RN, Post -op Vital signs reviewed and stable, and Patient moving all extremities  Post vital signs: Reviewed and stable  Last Vitals:  Vitals Value Taken Time  BP    Temp    Pulse    Resp    SpO2      Last Pain:  Vitals:   03/15/23 1123  TempSrc: Temporal  PainSc:       Patients Stated Pain Goal: 0 (03/04/23 1218)  Complications: No notable events documented.

## 2023-03-15 NOTE — Anesthesia Preprocedure Evaluation (Addendum)
Anesthesia Evaluation  Patient identified by MRN, date of birth, ID band Patient confused    Reviewed: Allergy & Precautions, NPO status , Patient's Chart, lab work & pertinent test results, Unable to perform ROS - Chart review only  Airway Mallampati: III  TM Distance: >3 FB    Comment: Previous grade II view with Miller 2, easy mask Dental  (+) Dental Advisory Given   Pulmonary    Pulmonary exam normal breath sounds clear to auscultation       Cardiovascular + Past MI  + dysrhythmias (Mobitz II AV block) + Valvular Problems/Murmurs  Rhythm:Regular Rate:Normal  TTE 12/27/2022: IMPRESSIONS     1. Limited Echo for LVEF assessment.   2. Left ventricular ejection fraction, by estimation, is 60 to 65%. The  left ventricle has normal function. The left ventricle has no regional  wall motion abnormalities. Left ventricular diastolic function not  assessed.   3. Right ventricular systolic function is normal. The right ventricular  size is normal. Tricuspid regurgitation signal is inadequate for assessing  PA pressure.   4. The inferior vena cava is normal in size with greater than 50%  respiratory variability, suggesting right atrial pressure of 3 mmHg.     Neuro/Psych  PSYCHIATRIC DISORDERS Anxiety  Bipolar Disorder Schizophrenia  meningioma    GI/Hepatic ,GERD  Medicated,,proctitis   Endo/Other    Renal/GU ESRF and DialysisRenal disease (Last HD 03/13/2023)     Musculoskeletal   Abdominal   Peds  Hematology  (+) Blood dyscrasia (Hgb 7.3), anemia   Anesthesia Other Findings   Reproductive/Obstetrics                              Anesthesia Physical Anesthesia Plan  ASA: 3  Anesthesia Plan: MAC   Post-op Pain Management: Minimal or no pain anticipated   Induction: Intravenous  PONV Risk Score and Plan: 2 and Propofol infusion and Treatment may vary due to age or medical  condition  Airway Management Planned: Natural Airway and Nasal Cannula  Additional Equipment:   Intra-op Plan:   Post-operative Plan:   Informed Consent: I have reviewed the patients History and Physical, chart, labs and discussed the procedure including the risks, benefits and alternatives for the proposed anesthesia with the patient or authorized representative who has indicated his/her understanding and acceptance.     Consent reviewed with POA (Phone consent with legal guardian, Anwar Crill, at [phone])  Plan Discussed with: Anesthesiologist and CRNA  Anesthesia Plan Comments: (Discussed with patient risks of MAC including, but not limited to, minor pain or discomfort, hearing people in the room, and possible need for backup general anesthesia. Risks for general anesthesia also discussed including, but not limited to, sore throat, hoarse voice, chipped/damaged teeth, injury to vocal cords, nausea and vomiting, allergic reactions, lung infection, heart attack, stroke, and death. All questions answered. )         Anesthesia Quick Evaluation

## 2023-03-15 NOTE — Consult Note (Addendum)
[facility] Face-to-Face Psychiatry Consult   Reason for Consult:  schizoaffective disorder and bipolar disorder Referring Physician:  Dr. Ramiro Harvest  Patient Identification: Madison [name] MRN:  [id number] Principal Diagnosis: ESRD on hemodialysis [facility]) Diagnosis:  Principal Problem:   ESRD on hemodialysis (HCC) Active Problems:   Schizoaffective disorder, bipolar type (HCC)   Hip fracture, left (HCC)   Malnutrition of moderate degree   Hematochezia   Constipation   Gastroesophageal reflux disease   BRBPR (bright red blood per rectum)   Leukocytosis   Sinus tachycardia   Anemia due to chronic kidney disease, on chronic dialysis (HCC)   Total Time spent with patient: 1 hour  PT is a poor historian, level 5 caveat.   Subjective:   Madison [name] is a 72 y.o. female patient admitted with ESRD  Today, the client is pleasantly rambling about why she is here, and is displaying nonsensical speech. She is disoriented and not logically thinking.  She is focused on her current admission, consistent with a dementia presentation. It is felt that this condition may be progressing and will need a higher level of care.  [name] also has schizoaffective disorder that is being managed by Abilify 15mg  po daily.  She has multiple factors that can contribute to her current presentation ESRD, infection, anemia, (pneumonia, electrolyte abnormalities) pain.  It is obvious that she does not have capacity to make sound medical decisions at this time and this should be decided by her POA.  She is pleasantly confused, not manic, psych will sign off at this time.    On evaluation, the patient is observed lying in the bed, covers pulled up to her chin.   Patient is rambling, disorganized, restless and unable to answer assessment questions appropriately at this time. She does not appear to be responding to internal or external stimuli, or displaying delusional thoughts.   Attempted to obtain collateral from  sister/legal guardian unsuccessful. Voicemail not identified therefore no message left.    HPI:  Per Dr. Debby Bud on Mar 22, 2023, Madison [name] is a 72 y.o. female with a medical h/o ESRD, schizoaffective disorder, bipolar type,  who presented today to the [facility] emergency department complaining of a fall that occurred on Saturday, the  Circumstances of which are unclear. EMS reports that they were told by the nursing facility that patient fell but gave little additional information. Patient has been nonambulatory ever since then. Patient's family apparently came and visited today and that prompted the facility to send patient to the ED. Patient initially complained of left hip pain but it did not complain of bilateral hip pain. No other injuries were report or found. In the [facility] ED xray revealed a left femoral hip fracture. CXR w/ NAD, K 5.8, BUN 64, Cr 10.75, Glucose 116, Albumin 2.8, T. Protein 7.2, LFTs nl, PT/INR nl, CBC diff nl. HD services unavailable at [facility] and the patient subsequently accepted in transfer to [facility] service at [facility]. For Ortho Mr. Tinnie Gens, [place] and ortho service will see patient in consultation for ORIF probably 02/27/23.      Past Psychiatric History: Sister reported patient has had a diagnosis of schizophrenia since the age of 72.  Risk to Self:   Denies Risk to Others:   UTA Prior Inpatient Therapy:  UTA Prior Outpatient Therapy:  UTA  Past Medical History:  Past Medical History:  Diagnosis Date   Anxiety    Bipolar disorder (HCC)    ESRD (end stage renal disease) on dialysis (HCC)  Neuropathy    Schizophrenia (HCC)     Past Surgical History:  Procedure Laterality Date   CHOLECYSTECTOMY     COLONOSCOPY N/A 01/20/2016   Dr. Darrick Penna: 12 mm tubular adenoma removed from the transverse colon, 4 hyperplastic polyps removed from the rectum and sigmoid colon.  Next colonoscopy planned for April 2020.   ESOPHAGOGASTRODUODENOSCOPY N/A 12/19/2017   Procedure: ESOPHAGOGASTRODUODENOSCOPY  (EGD);  Surgeon: West Bali, MD;  [facility];  Service: Endoscopy;  Laterality: N/A;  8:30am   fistula left arm     GIVENS CAPSULE STUDY N/A 01/14/2018   Procedure: GIVENS CAPSULE STUDY;  Surgeon: West Bali, MD;  Location: [facility];  Service: Endoscopy;  Laterality: N/A;  7:30am   HEMICOLECTOMY Right    HIP ARTHROPLASTY Left Mar 16, 2023   Procedure: ARTHROPLASTY BIPOLAR HIP (HEMIARTHROPLASTY);  Surgeon: Myrene Galas, MD;  Location: [facility] OR;  Service: Orthopedics;  Laterality: Left;   IR AV DIALY SHUNT INTRO NEEDLE/INTRACATH INITIAL W/PTA/IMG LEFT  03/04/2023   IR US GUIDE VASC ACCESS LEFT  03/04/2023   TUBAL LIGATION     Family History:  Family History  Problem Relation Age of Onset   Alzheimer's disease Mother    Cancer Mother        pancreatic   Diabetes Mother    Prostate cancer Father    Kidney failure Sister    Breast cancer Neg Hx    Colon cancer Neg Hx    Family Psychiatric  History: Noncontributory. Per chart review patient mother diagnosed with dementia.  Social History:  Social History   Substance and Sexual Activity  Alcohol Use No     Social History   Substance and Sexual Activity  Drug Use No    Social History   Socioeconomic History   Marital status: Single    Spouse name: Not on file   Number of children: Not on file   Years of education: Not on file   Highest education level: Not on file  Occupational History   Not on file  Tobacco Use   Smoking status: Never   Smokeless tobacco: Never  Vaping Use   Vaping Use: Never used  Substance and Sexual Activity   Alcohol use: No   Drug use: No   Sexual activity: Not Currently    Birth control/protection: Surgical    Comment: hyst/tubal  Other Topics Concern   Not on file  Social History Narrative   Not on file   Social Determinants of Health   Financial Resource Strain: Not on file  Food Insecurity: Not on file  Transportation Needs: Not on file  Physical Activity: Not on  file  Stress: Not on file  Social Connections: Not on file   Additional Social History:    Allergies:  No Known Allergies  Labs:  Results for orders placed or performed during the hospital encounter of 03/13/2023 (from the past 48 hour(s))  CBC     Status: Abnormal   Collection Time: 03/14/23 12:45 AM  Result Value Ref Range   WBC 12.1 (H) 4.0 - 10.5 K/uL   RBC 2.56 (L) 3.87 - 5.11 MIL/uL   Hemoglobin 7.3 (L) 12.0 - 15.0 g/dL   HCT 32.9 (L) 92.4 - 26.8 %   MCV 87.9 80.0 - 100.0 fL   MCH 28.5 26.0 - 34.0 pg   MCHC 32.4 30.0 - 36.0 g/dL   RDW 34.1 (H) 96.2 - 22.9 %   Platelets 351 150 - 400 K/uL   nRBC  0.0 0.0 - 0.2 %    Comment: Performed at [facility], [address], [place], [place] [address]  Ferritin     Status: Abnormal   Collection Time: 03/14/23 12:45 AM  Result Value Ref Range   Ferritin 4,206 (H) 11 - 307 ng/mL    Comment: Performed at [facility], [address] [place], [place] [address]  Iron and TIBC     Status: Abnormal   Collection Time: 03/14/23 12:45 AM  Result Value Ref Range   Iron 15 (L) 28 - 170 ug/dL   TIBC 098 (L) 119 - 147 ug/dL   Saturation Ratios 13 10.4 - 31.8 %   UIBC 101 ug/dL    Comment: Performed at [facility], [address] [place], [place] [address]  Vitamin B12     Status: Abnormal   Collection Time: 03/14/23 12:45 AM  Result Value Ref Range   Vitamin B-12 1,108 (H) 180 - 914 pg/mL    Comment: (NOTE) This assay is not validated for testing neonatal or myeloproliferative syndrome specimens for Vitamin B12 levels. Performed at [facility], [address] [place], [place] [address]   Erythropoietin     Status: Abnormal   Collection Time: 03/14/23 12:45 AM  Result Value Ref Range   Erythropoietin 138.2 (H) 2.6 - 18.5 mIU/mL    Comment: (NOTE) [employer] UniCel DxI 800 Immunoassay System Values obtained with different assay methods or kits cannot be used interchangeably. Results cannot be  interpreted as absolute evidence of the presence or absence of malignant disease. Performed At: [facility] [address] [place], [place] [id number] Jolene Schimke MD XB:[id number]   Folate     Status: None   Collection Time: 03/14/23 12:45 AM  Result Value Ref Range   Folate 26.1 >5.9 ng/mL    Comment: RESULT CONFIRMED BY MANUAL DILUTION Performed at [facility], [address], [place], [place] [address]   Reticulocytes     Status: Abnormal   Collection Time: 03/14/23 12:45 AM  Result Value Ref Range   Retic Ct Pct 0.9 0.4 - 3.1 %   RBC. 2.54 (L) 3.87 - 5.11 MIL/uL   Retic Count, Absolute 21.6 19.0 - 186.0 K/uL   Immature Retic Fract 21.0 (H) 2.3 - 15.9 %    Comment: Performed at [facility], [address], [place], [place] [address]  Renal function panel     Status: Abnormal   Collection Time: 03/14/23  9:50 AM  Result Value Ref Range   Sodium 138 135 - 145 mmol/L   Potassium 4.1 3.5 - 5.1 mmol/L   Chloride 91 (L) 98 - 111 mmol/L   CO2 25 22 - 32 mmol/L   Glucose, Bld 123 (H) 70 - 99 mg/dL    Comment: Glucose reference range applies only to samples taken after fasting for at least 8 hours.   BUN 29 (H) 8 - 23 mg/dL   Creatinine, Ser 0.34 (H) 0.44 - 1.00 mg/dL   Calcium 74.2 (H) 8.9 - 10.3 mg/dL   Phosphorus 4.2 2.5 - 4.6 mg/dL   Albumin 2.3 (L) 3.5 - 5.0 g/dL   GFR, Estimated 12 (L) >60 mL/min    Comment: (NOTE) Calculated using the CKD-EPI Creatinine Equation (2021)    Anion gap 22 (H) 5 - 15    Comment: ELECTROLYTES REPEATED TO VERIFY Performed at [facility], [address] [place], [place] [address]   Renal  function panel     Status: Abnormal   Collection Time: 03/17/2023 10:07 AM  Result Value Ref Range   Sodium 135 135 - 145 mmol/L   Potassium 4.8 3.5 - 5.1 mmol/L   Chloride 92 (L) 98 - 111 mmol/L   CO2 26 22 - 32 mmol/L   Glucose, Bld 104 (H) 70 - 99 mg/dL    Comment: Glucose reference range applies only to samples taken  after fasting for at least 8 hours.   BUN 46 (H) 8 - 23 mg/dL   Creatinine, Ser 3.87 (H) 0.44 - 1.00 mg/dL   Calcium 56.4 (H) 8.9 - 10.3 mg/dL   Phosphorus 4.9 (H) 2.5 - 4.6 mg/dL   Albumin 2.3 (L) 3.5 - 5.0 g/dL   GFR, Estimated 8 (L) >60 mL/min    Comment: (NOTE) Calculated using the CKD-EPI Creatinine Equation (2021)    Anion gap 17 (H) 5 - 15    Comment: Performed at [facility], [address] [place], [place] [address]  Ammonia     Status: None   Collection Time: 03/23/2023 10:07 AM  Result Value Ref Range   Ammonia 15 9 - 35 umol/L    Comment: Performed at [facility], [address], [place], [place] [address]  TSH     Status: Abnormal   Collection Time: 03/12/2023 10:07 AM  Result Value Ref Range   TSH 5.299 (H) 0.350 - 4.500 uIU/mL    Comment: Performed by a 3rd Generation assay with a functional sensitivity of <=0.01 uIU/mL. Performed at [facility], [address] [place], [place] [address]   CBC     Status: Abnormal   Collection Time: 03/23/2023 10:30 AM  Result Value Ref Range   WBC 11.2 (H) 4.0 - 10.5 K/uL   RBC 2.90 (L) 3.87 - 5.11 MIL/uL   Hemoglobin 8.2 (L) 12.0 - 15.0 g/dL   HCT 01.6 (L) 01.0 - 93.2 %   MCV 93.8 80.0 - 100.0 fL   MCH 28.3 26.0 - 34.0 pg   MCHC 30.1 30.0 - 36.0 g/dL   RDW 35.5 (H) 73.2 - 20.2 %   Platelets 429 (H) 150 - 400 K/uL   nRBC 0.0 0.0 - 0.2 %    Comment: Performed at [facility], [address], [place], [place] [address]  Differential     Status: Abnormal   Collection Time: 03/14/2023 10:30 AM  Result Value Ref Range   Neutrophils Relative % 79 %   Neutro Abs 8.8 (H) 1.7 - 7.7 K/uL   Lymphocytes Relative 11 %   Lymphs Abs 1.2 0.7 - 4.0 K/uL   Monocytes Relative 6 %   Monocytes Absolute 0.7 0.1 - 1.0 K/uL   Eosinophils Relative 2 %   Eosinophils Absolute 0.2 0.0 - 0.5 K/uL   Basophils Relative 1 %   Basophils Absolute 0.1 0.0 - 0.1 K/uL   Immature Granulocytes 1 %   Abs Immature Granulocytes 0.15 (H)  0.00 - 0.07 K/uL    Comment: Performed at [facility], [address] [place], [place] [address]    Current Facility-Administered Medications  Medication Dose Route Frequency Provider Last Rate Last Admin   acetaminophen (TYLENOL) tablet 650 mg  650 mg Oral TID Montez Morita, PA-C   650 mg at 03/14/23 1630   ARIPiprazole (ABILIFY) tablet 15 mg  15 mg Oral Daily Rodolph Bong, MD   15 mg at 03/14/23 0834   [START  ON 03/16/2023] bisacodyl (DULCOLAX) suppository 10 mg  10 mg Rectal Daily Imogene Burn, MD       Chlorhexidine Gluconate Cloth 2 % PADS 6 each  6 each Topical Q0600 Estanislado Emms, MD   6 each at 03/14/23 0538   [START ON 03/19/2023] Darbepoetin Alfa (ARANESP) injection 100 mcg  100 mcg Subcutaneous Q Tue-1800 Estanislado Emms, MD       docusate (COLACE) 50 MG/5ML liquid 100 mg  100 mg Oral BID Sheikh, Omair Latif, DO       feeding supplement (BOOST / RESOURCE BREEZE) liquid 1 Container  1 Container Oral TID BM Sheikh, Omair [place], DO   1 Container at 03/14/23 1306   fentaNYL (SUBLIMAZE) injection 12.5 mcg  12.5 mcg Intravenous Q2H PRN Montez Morita, PA-C   12.5 mcg at 03/12/23 1802   haloperidol lactate (HALDOL) injection 1-2 mg  1-2 mg Intravenous Q6H PRN Marguerita Merles Latif, DO   1 mg at 03/12/23 1802   menthol-cetylpyridinium (CEPACOL) lozenge 3 mg  1 lozenge Oral PRN Montez Morita, PA-C       Or   phenol (CHLORASEPTIC) mouth spray 1 spray  1 spray Mouth/Throat PRN Montez Morita, PA-C       metoprolol tartrate (LOPRESSOR) tablet 12.5 mg  12.5 mg Oral BID Rodolph Bong, MD       midodrine (PROAMATINE) tablet 10 mg  10 mg Oral TID WC Rodolph Bong, MD   10 mg at 03/14/23 1630   mirtazapine (REMERON) tablet 15 mg  15 mg Oral QHS Montez Morita, PA-C   15 mg at 03/12/23 2121   multivitamin (RENA-VIT) tablet 1 tablet  1 tablet Oral QHS Lonia Blood, MD   1 tablet at 03/12/23 2130   ondansetron (ZOFRAN) tablet 4 mg  4 mg Oral Q6H PRN Montez Morita, PA-C       Or    ondansetron [facility]) injection 4 mg  4 mg Intravenous Q6H PRN Montez Morita, PA-C       Oral care mouth rinse  15 mL Mouth Rinse 4 times per day Marguerita Merles Latif, DO   15 mL at 03/07/2023 2956   Oral care mouth rinse  15 mL Mouth Rinse PRN Marguerita Merles Latif, DO       oxyCODONE (Oxy IR/ROXICODONE) immediate release tablet 5-10 mg  5-10 mg Oral Q4H PRN Montez Morita, PA-C   10 mg at 03/10/2023 1354   pantoprazole (PROTONIX) injection 40 mg  40 mg Intravenous Q12H Sheikh, Kateri Mc Hawthorne, DO   40 mg at 03/24/2023 0914   piperacillin-tazobactam (ZOSYN) IVPB 2.25 g  2.25 g Intravenous Q8H Rexford Maus, RPH 100 mL/hr at 03/23/2023 0923 2.25 g at 03/13/2023 0923   polyethylene glycol (MIRALAX / GLYCOLAX) packet 17 g  17 g Oral BID Unk Lightning, [place]   17 g at 03/13/23 1806   senna-docusate (Senokot-S) tablet 1 tablet  1 tablet Oral BID Rodolph Bong, MD       sevelamer carbonate (RENVELA) tablet 1,600 mg  1,600 mg Oral TID WC Dagoberto Ligas, MD   1,600 mg at 03/14/23 0832   [START ON 03/16/2023] sorbitol, milk of mag, mineral oil, glycerin (SMOG) enema  960 mL Rectal Once Imogene Burn, MD       valproic acid (DEPAKENE) 250 MG/5ML solution 250 mg  250 mg Oral TID WC Montez Morita, PA-C   250 mg at 03/14/23 1630    Musculoskeletal: Strength & Muscle Tone:  Weakness Gait &  Station: UTA Patient leans: N/A  Psychiatric Specialty Exam:  Presentation  General Appearance:  Appropriate for Environment  Eye Contact: Minimal  Speech: Other (comment) (rambling non sensical)  Speech Volume: Decreased  Handedness: Right   Mood and Affect  Mood: Anxious  Affect: Congruent   Thought Process  Thought Processes: Disorganized  Descriptions of Associations:Loose  Orientation:Partial  Thought Content:Scattered  History of Schizophrenia/Schizoaffective disorder:Yes  Duration of Psychotic Symptoms:Less than six months  Hallucinations:Hallucinations: None  Ideas of  Reference:None  Suicidal Thoughts:Suicidal Thoughts: No  Homicidal Thoughts:Homicidal Thoughts: No   Sensorium  Memory: Immediate Poor; Recent Poor; Remote Poor  Judgment: Impaired  Insight: Lacking   Executive Functions  Concentration: Poor  Attention Span: Poor  Recall: Poor  Fund of Knowledge: Poor  Language: Poor   Psychomotor Activity  Psychomotor Activity: Psychomotor Activity: Psychomotor Retardation   Assets  Assets: Housing; Social Support; Financial Resources/Insurance   Sleep  Sleep: Sleep: Fair   Physical Exam: Physical Exam Vitals and nursing note reviewed.  Constitutional:      Appearance: Normal appearance. She is normal weight.  Neurological:     General: No focal deficit present.     Mental Status: She is alert. Mental status is at baseline. She is disoriented.     Comments: Fine orofacial tremors noted  Psychiatric:        Attention and Perception: Perception normal. She is inattentive.        Mood and Affect: Affect normal. Mood is anxious.        Speech: She is noncommunicative.        Behavior: Behavior normal. Behavior is cooperative.        Thought Content: Thought content normal.        Cognition and Memory: Cognition is impaired. Memory is impaired.        Judgment: Judgment is impulsive. Judgment is not inappropriate.    Review of Systems  Psychiatric/Behavioral:  Positive for depression. Negative for substance abuse and suicidal ideas. The patient is nervous/anxious.   All other systems reviewed and are negative.  Blood pressure (!) 107/58, pulse 86, temperature (!) 97.1 F (36.2 C), temperature source Tympanic, resp. rate 17, height 5\' 4"  (1.626 m), weight 46.9 kg, SpO2 93 %. Body mass index is 17.75 kg/m.  Treatment Plan Summary: Medication management and Plan   -Patient with duplicate orders of Abilify 15mg  po evening. 2nd order dc'd  -Will discontinue cogentin at this time. Patient on second generation  medication, limited EPS like symptoms associated with Abilify.  - TOC consult patient will likely benefit from higher level of care.  VAL, Ammonia, CK, ordered last night by provider, continues to remain pending and not available at the time of this note. If VAL is elevated > 80, please consider reducing dose or reaching out via secure chat for medication recommendations. If ammonia level is elevated consider dc VAL. If CK is elevated consider fluids.  -Patient will benefit from outpatient neurology for neurocognitive consult.   Labs reviewed with multiple abnormalities --> defer to primary team.  TSH elevated (5.22) abnormal TSH levels can contribute to worsening neurocognitive impairment in geriatric patients.    Psych consult to sign off.   Disposition: No evidence of imminent risk to self or others at present.   Patient does not meet criteria for psychiatric inpatient admission. Supportive therapy provided about ongoing stressors.  Maryagnes Amos, FNP 03/09/2023 2:49 PM   I have reviewed the note by NP Starkes-Perry, and discussed the plan  of care.  I have addended the note, and am in agreement with the assessment and plan. While future psychiatric events cannot be accurately predicted, the patient does not currently require acute inpatient psychiatric care and does not currently meet [facility] involuntary commitment criteria. Psychiatry will sign off.  Please re-consult for any future acute psychiatric concerns.   Mariel Craft, MD         Mariel Craft, MD 02/23/2023 3:20 PM

## 2023-03-15 NOTE — TOC Progression Note (Signed)
Transition of Care [facility]) - Progression Note    Patient Details  Name: Madison [name] MRN: [id number] Date of Birth: [birth year]  Transition of Care [facility]) CM/SW Contact  Lorri Frederick, LCSW Phone Number: 03/15/2023, 3:03 PM  Clinical Narrative:   CSW spoke to pt sister/guardian Beverly.  Meriam Sprague reports that new HD center in [place] has reached out to her and asked her to be present for the first HD sessions.  Meriam Sprague was staying with pt for the entire session 3x week at the HD center in [facility], and she is available to continue this.  CSW spoke with Debbie/[facility].  She is still planning on pt to admit when ready, as long as she still has an available bed.    Expected Discharge Plan: Skilled Nursing Facility Barriers to Discharge: Continued Medical Work up, SNF Pending bed offer  Expected Discharge Plan and Services In-house Referral: Clinical Social Work   Post Acute Care Choice: Skilled Nursing Facility Living arrangements for the past 2 months: Skilled Nursing Facility ([facility])                                       Social Determinants of Health (SDOH) Interventions SDOH Screenings   Alcohol Screen: Low Risk  (12/25/2018)  Tobacco Use: Low Risk  (03/15/2023)    Readmission Risk Interventions    12/27/2022    8:27 AM  Readmission Risk Prevention Plan  Transportation Screening Complete  HRI or Home Care Consult Complete  Social Work Consult for Recovery Care Planning/Counseling Complete  Palliative Care Screening Not Applicable  Medication Review [employer]) Complete

## 2023-03-16 ENCOUNTER — Other Ambulatory Visit: Payer: Self-pay

## 2023-03-16 DIAGNOSIS — Z7189 Other specified counseling: Secondary | ICD-10-CM

## 2023-03-16 DIAGNOSIS — Z515 Encounter for palliative care: Secondary | ICD-10-CM | POA: Diagnosis not present

## 2023-03-16 DIAGNOSIS — N186 End stage renal disease: Secondary | ICD-10-CM | POA: Diagnosis not present

## 2023-03-16 DIAGNOSIS — Z992 Dependence on renal dialysis: Secondary | ICD-10-CM | POA: Diagnosis not present

## 2023-03-16 LAB — RENAL FUNCTION PANEL
Albumin: 2 g/dL — ABNORMAL LOW (ref 3.5–5.0)
Anion gap: 14 (ref 5–15)
BUN: 19 mg/dL (ref 8–23)
CO2: 28 mmol/L (ref 22–32)
Calcium: 9.4 mg/dL (ref 8.9–10.3)
Chloride: 92 mmol/L — ABNORMAL LOW (ref 98–111)
Creatinine, Ser: 3.02 mg/dL — ABNORMAL HIGH (ref 0.44–1.00)
GFR, Estimated: 16 mL/min — ABNORMAL LOW (ref >60)
Glucose, Bld: 97 mg/dL (ref 70–99)
Phosphorus: 3.3 mg/dL (ref 2.5–4.6)
Potassium: 3.5 mmol/L (ref 3.5–5.1)
Sodium: 134 mmol/L — ABNORMAL LOW (ref 135–145)

## 2023-03-16 LAB — CBC
HCT: 22.5 % — ABNORMAL LOW (ref 36.0–46.0)
Hemoglobin: 7.2 g/dL — ABNORMAL LOW (ref 12.0–15.0)
MCH: 29.1 pg (ref 26.0–34.0)
MCHC: 32 g/dL (ref 30.0–36.0)
MCV: 91.1 fL (ref 80.0–100.0)
Platelets: 378 10*3/uL (ref 150–400)
RBC: 2.47 MIL/uL — ABNORMAL LOW (ref 3.87–5.11)
RDW: 18.9 % — ABNORMAL HIGH (ref 11.5–15.5)
WBC: 12 10*3/uL — ABNORMAL HIGH (ref 4.0–10.5)
nRBC: 0 % (ref 0.0–0.2)

## 2023-03-16 NOTE — Plan of Care (Signed)
  Problem: Education: Goal: Knowledge of General Education information will improve Description: Including pain rating scale, medication(s)/side effects and non-pharmacologic comfort measures Outcome: Progressing   Problem: Health Behavior/Discharge Planning: Goal: Ability to manage health-related needs will improve Outcome: Progressing   Problem: Coping: Goal: Level of anxiety will decrease Outcome: Progressing   Problem: Nutrition: Goal: Adequate nutrition will be maintained Outcome: Progressing   Problem: Elimination: Goal: Will not experience complications related to bowel motility Outcome: Progressing Goal: Will not experience complications related to urinary retention Outcome: Progressing   Problem: Pain Managment: Goal: General experience of comfort will improve Outcome: Progressing   Problem: Safety: Goal: Ability to remain free from injury will improve Outcome: Progressing

## 2023-03-16 NOTE — Progress Notes (Signed)
Physical Therapy Treatment Patient Details Name: Madison [name] MRN: [id number] DOB: [birth year] Today's Date: 03/16/2023   History of Present Illness 72 y.o. female admitted 6/4 for fall and hip pain, s/p UNIPOLAR HEMIARTHROPLASTY OF THE LEFT HIP on 6/6. PMHx: history of ESRD, HTN, and schizophrenia versus bipolar disorder.    PT Comments    Pt supine in bed on arrival.  She was able to carry on conversation at start of session.  She still required max to total assistance for all aspects of mobility.  Pre tx BP assessed and again in sitting x 2.  She presents with orthostatic hypotension.  Informed RN and MD.  BP limited progression of tx today.   Orthostatic vitals BP supine 109/60 BP sitting 99/61 BP sitting after 4 min 86/53   Recommendations for follow up therapy are one component of a multi-disciplinary discharge planning process, led by the attending physician.  Recommendations may be updated based on patient status, additional functional criteria and insurance authorization.  Follow Up Recommendations  Can patient physically be transported by private vehicle: No    Assistance Recommended at Discharge Intermittent Supervision/Assistance  Patient can return home with the following Two people to help with walking and/or transfers;Two people to help with bathing/dressing/bathroom;Assistance with cooking/housework;Assist for transportation;Help with stairs or ramp for entrance   Equipment Recommendations  None recommended by PT    Recommendations for Other Services       Precautions / Restrictions Precautions Precautions: Posterior Hip Precaution Comments: Unable to understand due to poor cognition Required Braces or Orthoses: Other Brace Knee Immobilizer - Left: Other (comment) (Hip abduction wedge in supine.) Other Brace: wedge cushion Restrictions Weight Bearing Restrictions: Yes LLE Weight Bearing: Weight bearing as tolerated     Mobility  Bed Mobility Overal bed  mobility: Needs Assistance Bed Mobility: Rolling Rolling: Max assist (placed pillow between knees to maintain hip precautions.)   Supine to sit: Max assist, HOB elevated     General bed mobility comments: PTA performed assistance to edge of bed with tactile guidance for B LEs and assistance to elevate trunk into sitting and maintain weight shifting to the L.  Pt required assistance for head control and posture.  Attempted to cues to stand but unable.  BP in supine 109/60, in sitting 99/61,  in sitting after 4 min 86/53.  Unable to progress due to BP and decreased alertness with soft BP.  Pt more alert once in supine but deffered OOB transfers at this time.    Transfers                        Ambulation/Gait                   Stairs             Wheelchair Mobility    Modified Rankin (Stroke Patients Only)       Balance     Sitting balance-Leahy Scale: Poor Sitting balance - Comments: external support +1 for upright sitting edge of bed, B hand hold on cross bar of steady. Postural control: Posterior lean                                  Cognition Arousal/Alertness: Awake/alert Behavior During Therapy: Anxious, Flat affect Overall Cognitive Status: History of cognitive impairments - at baseline  General Comments: Pt initially awake and verbal as BP began to drop she became less verbal.        Exercises      General Comments        Pertinent Vitals/Pain Pain Assessment Pain Assessment: Faces Faces Pain Scale: Hurts whole lot Pain Location: L hip with movements (pt did not localize but guarding in this area) Pain Descriptors / Indicators: Grimacing, Guarding, Moaning Pain Intervention(s): Monitored during session, Repositioned    Home Living                          Prior Function            PT Goals (current goals can now be found in the care plan section) Acute  Rehab PT Goals Patient Stated Goal: none stated Potential to Achieve Goals: Fair Progress towards PT goals: Progressing toward goals    Frequency    Min 2X/week      PT Plan Current plan remains appropriate    Co-evaluation              AM-PAC PT "6 Clicks" Mobility   Outcome Measure  Help needed turning from your back to your side while in a flat bed without using bedrails?: Total Help needed moving from lying on your back to sitting on the side of a flat bed without using bedrails?: Total Help needed moving to and from a bed to a chair (including a wheelchair)?: Total Help needed standing up from a chair using your arms (e.g., wheelchair or bedside chair)?: Total Help needed to walk in hospital room?: Total Help needed climbing 3-5 steps with a railing? : Total 6 Click Score: 6    End of Session Equipment Utilized During Treatment: Gait belt Activity Tolerance: Patient limited by pain Patient left: with call bell/phone within reach;in bed;with nursing/sitter in room (left with nursing to finish changing linen from stool incontinence.) Nurse Communication: Mobility status (drop in BP) PT Visit Diagnosis: Muscle weakness (generalized) (M62.81);History of falling (Z91.81);Difficulty in walking, not elsewhere classified (R26.2);Pain Pain - Right/Left: Left Pain - part of body: Hip;Leg     Time: 8295-6213 PT Time Calculation (min) (ACUTE ONLY): 30 min  Charges:  $Therapeutic Activity: 23-37 mins                     Bonney Leitz , PTA Acute Rehabilitation Services Office [phone]    Florestine Avers 03/16/2023, 12:31 PM

## 2023-03-16 NOTE — Progress Notes (Signed)
[facility] NEPHROLOGY PROGRESS NOTE  Assessment/ Plan:  # s/p hemiarthroplasty for left hip fracture: plan for SNF, rehab.   # ESRD: On HD per MWF schedule (previously [facility] in [place]). -She had dialysis yesterday but it seems like she did not receive HD in chair.  We need to make sure that the patient receives dialysis in the chair on Monday. She looks frail and deconditioning on exam.  Discussed with renal navigator.  # Anemia CKD: Very high ferritin level.  Continue Aranesp and monitor hemoglobin.  Transfuse as needed.  # CKD-MBD: hyperphosphatemia - on sevelamer.  Will need a reduced ca bath for now if able (unfortunately we are limited inpatient).  Phosphorus level improved.  # Hypertension: Blood pressure low, on metoprolol.   #Bright red blood per rectum, proctitis. - Note that GI has been consulted.  Antibiotics per primary team discretion   # Disposition - she is changing SNF's and will need a new outpatient dialysis unit.  (She is not able to be discharged without a new outpatient HD unit confirmed).  Note that at this time she is not acceptable for outpatient HD. She needs to be able to communicate her needs in the outpatient dialysis setting and able to sit on recliner or chair for HD.  Subjective: Seen and examined at bedside.  No new event.  Had dialysis yesterday.  No chest pain, shortness of breath.  Objective Vital signs in last 24 hours: Vitals:   03/01/2023 1832 03/16/2023 2026 03/16/23 0500 03/16/23 0640  BP: (!) 101/52 (!) 101/54  (!) 103/59  Pulse: 92 98  96  Resp: 17 15  16   Temp: 97.7 F (36.5 C) 97.8 F (36.6 C)  (!) 97.5 F (36.4 C)  TempSrc:    Oral  SpO2: 96% 98%  99%  Weight:   47 kg   Height:       Weight change:   Intake/Output Summary (Last 24 hours) at 03/16/2023 1012 Last data filed at 03/24/2023 2245 Gross per 24 hour  Intake 210 ml  Output 0 ml  Net 210 ml        Labs: RENAL PANEL Recent Labs  Lab  03/10/23 0110 03/12/23 0105 03/13/23 0127 03/14/23 0950 02/23/2023 1007 03/16/23 0211  NA 132* 135 133* 138 135 134*  K 4.9 4.5 4.7 4.1 4.8 3.5  CL 89* 94* 92* 91* 92* 92*  CO2 26 24 26 25 26 28   GLUCOSE 103* 81 98 123* 104* 97  BUN 56* 27* 50* 29* 46* 19  CREATININE 6.11* 3.55* 5.21* 3.77* 5.23* 3.02*  CALCIUM 10.1 10.4* 10.5* 10.4* 10.4* 9.4  MG 1.8 1.7 1.9  --   --   --   PHOS 5.7* 4.4 4.4 4.2 4.9* 3.3  ALBUMIN 2.3* 2.8* 2.5* 2.3* 2.3* 2.0*     Liver Function Tests: Recent Labs  Lab 03/10/23 0110 03/12/23 0105 03/13/23 0127 03/14/23 0950 03/07/2023 1007 03/16/23 0211  AST 21 19 21   --   --   --   ALT 6 7 10   --   --   --   ALKPHOS 58 54 61  --   --   --   BILITOT 0.6 0.8 0.5  --   --   --   PROT 6.5 7.0 6.7  --   --   --   ALBUMIN 2.3* 2.8* 2.5* 2.3* 2.3* 2.0*    No results for input(s): "LIPASE", "AMYLASE" in the last 168 hours. Recent Labs  Lab 03/17/2023 1007  AMMONIA 15   CBC: Recent Labs    03/12/23 0150 03/13/23 0127 03/14/23 0045 03/10/2023 1030 03/16/23 0211  HGB 8.2* 7.7* 7.3* 8.2* 7.2*  MCV 89.3 91.2 87.9 93.8 91.1  VITAMINB12  --   --  1,108*  --   --   FOLATE  --   --  26.1  --   --   FERRITIN  --   --  4,206*  --   --   TIBC  --   --  116*  --   --   IRON  --   --  15*  --   --   RETICCTPCT  --   --  0.9  --   --      Cardiac Enzymes: Recent Labs  Lab 03/01/2023 2121  CKTOTAL 17*   CBG: No results for input(s): "GLUCAP" in the last 168 hours.  Iron Studies:  Recent Labs    03/14/23 0045  IRON 15*  TIBC 116*  FERRITIN 4,206*    Studies/Results: No results found.  Medications: Infusions:  piperacillin-tazobactam (ZOSYN)  IV 2.25 g (03/16/23 0903)    Scheduled Medications:  acetaminophen  650 mg Oral TID   ARIPiprazole  15 mg Oral Daily   bisacodyl  10 mg Rectal Daily   Chlorhexidine Gluconate Cloth  6 each Topical Q0600   [START ON 03/19/2023] darbepoetin (ARANESP) injection - NON-DIALYSIS  100 mcg Subcutaneous Q Tue-1800    docusate  100 mg Oral BID   feeding supplement  1 Container Oral TID BM   midodrine  10 mg Oral TID WC   mirtazapine  15 mg Oral QHS   multivitamin  1 tablet Oral QHS   mouth rinse  15 mL Mouth Rinse 4 times per day   pantoprazole (PROTONIX) IV  40 mg Intravenous Q12H   polyethylene glycol  17 g Oral BID   senna-docusate  1 tablet Oral BID   sevelamer carbonate  1,600 mg Oral TID WC   sorbitol, milk of mag, mineral oil, glycerin (SMOG) enema  960 mL Rectal Once   valproic acid  250 mg Oral TID WC    have reviewed scheduled and prn medications.  Physical Exam: General: Elderly looking female, lying on bed. Heart:RRR, s1s2 nl Lungs:clear b/l, no crackle Abdomen:soft, Non-tender, non-distended Extremities:No edema Dialysis Access: Left upper extremity AV fistula+  Tyasia Packard Prasad Mancel Lardizabal 03/16/2023,10:12 AM  LOS: 18 days

## 2023-03-16 NOTE — Progress Notes (Signed)
PROGRESS NOTE    Madison [name]  HQI:[id number] DOB: 14-Dec-1950 DOA: 03/14/2023 PCP: Charlynne Pander, MD    No chief complaint on file.   Brief Narrative:  The patient is a 72 year old SNF resident with past medical history significant for but limited to ESRD on hemodialysis Monday Wednesday Friday, hypertension, history of schizophrenia versus bipolar disorder as well as other comorbidities who presented to the [facility] ER after a fall which occurred multiple days prior.  X-rays at [facility] ER revealed a left femoral fracture and she was transferred to [facility] given that she needed a combination of the orthopedist as well as inpatient dialysis.  She underwent unipolar Hemi arthroplasty of the left hip on 02/27/2023 and has been deemed stable from a orthopedic standpoint.  She had issues with her dialysis catheter which has not been fixed and she is getting dialysis.  She was extremely agitated and hysterically crying early on during the hospitalization and remained emotionally labile and tearful however was calmer.   We were anticipating discharging back to SNF with resumption of her normal dialysis session on Friday, 03/08/2023 however a barrier to discharge was that the patient will need to be able to tolerate sitting in a chair for 4 to 5 hours to be appropriate for outpatient HD for discharge.  She was able to dialyze in the chair early on during the hospitalization, appropriately and had no issues with that but heart rate remained elevated in the setting of pain and agitation so she was initiated on a beta-blocker and given some pain medication.    Heart rate was improved however she now has a slight leukocytosis and after repeat is trending down.  TOC attempted to call the facility and got an response so unfortunately could not discharge given that no one at the bedside picked up.  Case worker is informed that the patient's legal guardian was unhappy with the SNF choice and requested to change her  SNF placement to [facility].  [facility] is able to take the patient at their facility  however because of the symptoms changed her dialysis center will need to be moved.     CBC continue to further worsen as she had bright red blood per rectum and was found to be impacted.  Having large stool ball and her bowel regimen has been escalated and obtained CT scan.  GI was consulted given her hemoglobin and bleeding and has bleeding noted to be subsiding, GI not recommending endoscopic procedures at this time.  GI feels patient bleeding likely secondary to inflammation in the rectum due to stercoral colitis, and the meds recommended for patient's constipation a few no further additional bleeding at this time..  Will need to continue monitor carefully.     Assessment & Plan:   Principal Problem:   ESRD on hemodialysis (HCC) Active Problems:   Schizoaffective disorder, bipolar type (HCC)   Hip fracture, left (HCC)   Malnutrition of moderate degree   Hematochezia   Constipation   Gastroesophageal reflux disease   BRBPR (bright red blood per rectum)   Leukocytosis   Sinus tachycardia   Anemia due to chronic kidney disease, on chronic dialysis (HCC)  #1 left femoral neck fracture -Secondary to mechanical fall. -Patient seen in consultation by orthopedics underwent unipolar hemiarthroplasty of the left hip 03/24/2023 without any significant complications. -Continue current pain regimen of oxycodone 5 to 10 mg every 4 hours as needed moderate pain, acetaminophen 650 mg 3 times daily, fentanyl 12.5 mg  every 2 hours as needed for severe pain. -Orthopedics recommending WBAT left lower extremity with assistance and posterior hip precautions for 12 weeks with daily wound care as needed and DVT prophylaxis with subcu heparin x 28 days. -Continue current bowel regimen. -Outpatient follow-up with orthopedic surgery 10 days postdischarge.  2.  Schizoaffective disorder/bipolar type with uncontrollable  crying and agitation intermittently/acute metabolic encephalopathy -Continue home regimen Abilify, Depakote, Cogentin.  -Patient noted to have been extremely agitated and anxious a few days ago however noted to be drowsy on 03/13/2023, alert 03/14/2023 however tearful and noted to have intermittent agitation.   -Patient also with some incomprehensible speech.  Patient difficult to redirect. -RN concerned about patient's inability to swallow, SLP evaluated patient patient cleared for regular diet. -Continue mirtazapine 15 mg nightly. -Haldol as needed. -Continue treatment for probable pneumonia.   -Psychiatry consulted for further management of schizoaffective disorder/bipolar disorder with bouts of uncontrollable crying and intermittent agitation.   -Patient seen by psychiatry, labs ordered with a CK level within normal limits, ammonia level of 15,valproic acid level < 10. -Continue current regimen of Abilify, Depakote.   -Psychiatry was following but signed off on 03/16/2023.   -May need to have psych reassess patient however will monitor for now.   -Patient with confusion, uncontrollable intermittent crying and agitation, no overt neurological deficits. -TSH noted at 5.299.  Vitamin B12 of 1108, folate of 26.1. -Patient on empiric IV antibiotics for presumed pneumonia which we will continue. -  3.  ESRD on HD Monday Wednesday Friday/hyperphosphatemia improved with dialysis/elevated anion gap -Patient noted with ongoing issues with dialysis cannulation which has been corrected post ultrasound-guided access of left upper extremity fistula for balloon angioplasty of outflow stenosis at the axillary vein with no residual on completion per IR 03/04/2023.. -Continue sevelamer 1600 mg 3 times daily, avoid nephrotoxic medications, contrast dyes, hypotension and dehydration to ensure adequate renal perfusion.  -Nephrology following and feel patient needs to be dialyzed in the chair prior to being  discharged and needs improved mental status prior to being set up for outpatient dialysis.  -Per nephrology.  4.  Sinus tachycardia -Noted in the setting of GI bleed constipation. -CT abdomen and pelvis with concerns for constipation, rectal wall thickening, perirectal stranding, fecal impaction. -Was on a beta-blocker however beta-blocker discontinued secondary to soft blood pressure.   -Sinus tachycardia improving.    5.  Constipation and fecal impaction -CT abdomen and pelvis done with large amount of stool in the rectum, wall thickening in the rectum perirectal stranding, findings suggesting fecal impaction and possible proctitis. -No evidence of intestinal obstruction or pneumoperitoneum, no hydronephrosis. -Patient seen in consultation by GI. -Patient received milk of molasses enema with good results on 03/12/2023.   -Patient given another milk and molasses enema and a smog enema on 03/13/2023 with large amount of stool noted with no blood in it.  -Continue current bowel regimen of MiraLAX twice daily, Senokot-S twice daily. -Patient seen by GI and underwent flexible sigmoidoscopy 03/18/2023 which showed large amount of solid stool in the rectum, partially cleared, 15 mm polyp in the rectum biopsied, multiple ulcers in the rectum biopsied. -Post flex sigmoidoscopy GI recommending ongoing bowel regimen and enemas for further stool evacuation and to avoid constipation as this will worsen stercoral ulcers, outpatient follow-up with [facility] vs [facility] for consideration of EGD/colonoscopy for further evaluation of anemia and removal of colon polyps in the future once patient has recovered from her recent hip fracture.  -Smog  enema ordered. -GI following.  6.??  Pneumonia -CT scan done showed small new patchy infiltrates in both lower lower lung fields suggest atelectasis/pneumonia.  Small bilateral pleural effusions more on the left.  Small pericardial effusion, coronary  artery disease. -Patient noted to be drowsy not awake enough to use a flutter valve incentive spirometry on 03/13/2023. -Patient more alert today however confused... -SLP assessed patient placed on a regular diet. -Patient with a leukocytosis white count of 14.9, repeat chest x-ray done 03/13/2023 with lower lung volumes with increasing patchy left basilar airspace disease, suspicious for pneumonia.  Small bilateral effusions. -Leukocytosis trending down. -Due to patient's drowsiness and leukocytosis patient started empirically on IV Zosyn to complete a course of antibiotic treatment.    7.  Leukocytosis in the setting of BRBPR -Initially felt to be reactive but trended up and now in the setting of likely GI bleed and constipation concerns for stercoral colitis. -Repeat chest x-ray done 03/13/2023, with increasing infiltrate concerning for pneumonia. -Blood cultures ordered and negative x 5 days.  -CT abdomen and pelvis done with large amount of stool in the rectum, wall thickening in the rectum and perirectal stranding findings suggestive fecal impaction and possible proctitis.  No evidence of intestinal obstruction or pneumoperitoneum.  No hydronephrosis. -Continue empiric IV Zosyn.    8.  Anemia of chronic kidney disease/postop anemia/BRBPR and concern for GI bleed (Likely stercoral colitis) -Patient with no significant further bleeding. -Patient seen in consultation by GI who recommended adjusting bowel regimen and continue to monitor hemoglobin. -Patient with no known or significant further bloody bowel movements. -Hemoglobin slowly trending down currently at 8.2. -Patient receiving Darbepoetin alfa 60 mcg q. Mondays. -Patient seen in consultation by GI and patient underwent flexible sigmoidoscopy 03/19/2023 which showed large amount of solid stool in the rectum, partially cleared, 15 mm polyp in the rectum biopsied, multiple ulcers in the rectum biopsied. -Post flex sigmoidoscopy GI  recommending ongoing bowel regimen and enemas for further stool evacuation and to avoid constipation as this will worsen stercoral ulcers, outpatient follow-up with [facility] versus [facility] for consideration of EGD/colonoscopy for further evaluation of anemia and removal of colon polyps in the future once patient has recovered from her recent hip fracture. -Follow H&H, transfusion threshold hemoglobin < 7.   9.  Nonsevere protein calorie malnutrition, moderate -Secondary to chronic illnesses (ESRD, schizoaffective disorder) as evidenced by moderate fat depletion depletion. -Dietitian following, may need to engage other forms of nutrition if patient continues to refuse food and may consider consultation with palliative care.  10.  GERD -PPI.  11.  Hypoalbuminemia - Follow.  12.  Soft blood pressure -Patient noted to be on midodrine 15 mg twice daily prior to admission. -Midodrine resumed and dose uptitrated to 10 mg 3 times daily for better blood pressure control.  -Beta-blocker discontinued.   DVT prophylaxis: SCDs Code Status: Full Family Communication: Updated patient.  Updated sister at bedside.   Disposition: Likely needs SNF when medically stable, and able to tolerate outpatient hemodialysis with improvement with mental status..  Status is: Inpatient Remains inpatient appropriate because: Severity of illness   Consultants:  Nephrology: Dr. Ronalee Belts 02/27/2023 Orthopedics: Dr. Carola Frost 03/07/2023 Interventional radiology: Dr. Loreta Ave 03/04/2023 Gastroenterology: Dr. Leonides Schanz 03/11/2023 Psychiatry Palliative care: Dr. Patterson Hammersmith 03/16/2023  Procedures:  CT abdomen pelvis 03/12/2023 Chest x-ray 03/21/2023, 03/10/2023, 03/13/2023 Abdominal x-rays 03/13/2023 Ultrasound-guided access left upper extremity fistula for balloon angioplasty of the outflow stenosis at the axillary vein with no residual on completion Through  maintained: Per IR Dr. Loreta Ave 03/04/2023 Plain films of the left  hip 04-Mar-2023 Unipolar hemiarthroplasty on the left hip: Per orthopedics: Dr. Carola Frost 03/04/23 Flexible sigmoidoscopy: Per Dr. Leonides Schanz 03/06/2023  Antimicrobials:  Anti-infectives (From admission, onward)    Start     Dose/Rate Route Frequency Ordered Stop   03/14/23 1400  piperacillin-tazobactam (ZOSYN) IVPB 2.25 g        2.25 g 100 mL/hr over 30 Minutes Intravenous Every 8 hours 03/14/23 0723     03/13/23 1700  piperacillin-tazobactam (ZOSYN) IVPB 2.25 g  Status:  Discontinued        2.25 g 100 mL/hr over 30 Minutes Intravenous Every 6 hours 03/13/23 1607 03/14/23 0723   03/13/23 1645  piperacillin-tazobactam (ZOSYN) IVPB 3.375 g  Status:  Discontinued        3.375 g 12.5 mL/hr over 240 Minutes Intravenous Every 8 hours 03/13/23 1557 03/13/23 1607   2023-03-04 1430  ceFAZolin (ANCEF) IVPB 2g/100 mL premix  Status:  Discontinued        2 g 200 mL/hr over 30 Minutes Intravenous Every 6 hours 03-04-2023 1334 03/04/2023 1344   03-04-2023 1147  vancomycin (VANCOCIN) powder  Status:  Discontinued          As needed 03-04-2023 1147 03/04/2023 1216   March 04, 2023 0830  ceFAZolin (ANCEF) IVPB 2g/100 mL premix        2 g 200 mL/hr over 30 Minutes Intravenous On call to O.R. Mar 04, 2023 0731 03-04-2023 1033   04-Mar-2023 0830  vancomycin (VANCOCIN) IVPB 1000 mg/200 mL premix        1,000 mg 200 mL/hr over 60 Minutes Intravenous On call to O.R. 03-04-2023 0731 2023/03/04 0858         Subjective: Patient laying in bed, alert, confused.  Tearful.  States at bedside.    Objective: Vitals:   03/18/2023 1832 03/22/2023 2026 03/16/23 0500 03/16/23 0640  BP: (!) 101/52 (!) 101/54  (!) 103/59  Pulse: 92 98  96  Resp: 17 15  16   Temp: 97.7 F (36.5 C) 97.8 F (36.6 C)  (!) 97.5 F (36.4 C)  TempSrc:    Oral  SpO2: 96% 98%  99%  Weight:   47 kg   Height:        Intake/Output Summary (Last 24 hours) at 03/16/2023 1250 Last data filed at 03/17/2023 2245 Gross per 24 hour  Intake 60 ml  Output 0 ml  Net 60 ml    Filed  Weights   03/10/2023 1123 03/06/2023 1457 03/16/23 0500  Weight: 46.9 kg 46.8 kg 47 kg    Examination:  General exam: NAD.  Respiratory system: Lungs clear to auscultation bilaterally.  No wheezes, no crackles, no rhonchi.  Fair air movement.  Speaking in full sentences.   Cardiovascular system: Regular rate and rhythm no murmurs rubs or gallops.  No JVD.  No lower extremity edema.   Gastrointestinal system: Abdomen is soft, nontender, nondistended, positive bowel sounds.  No rebound.  No guarding.   Central nervous system: Alert. Tearful.  Confused.  Moving extremities spontaneously.  No focal neurological deficits. Extremities: Symmetric 5 x 5 power. Skin: No rashes, lesions or ulcers Psychiatry: Judgement and insight poor. Mood and affect unable to assess.      Data Reviewed: I have personally reviewed following labs and imaging studies  CBC: Recent Labs  Lab 03/10/23 0110 03/11/23 0445 03/12/23 0150 03/13/23 0127 03/14/23 0045 03/14/2023 1030 03/16/23 0211  WBC 13.4*   < > 14.3* 14.9* 12.1* 11.2*  12.0*  NEUTROABS 10.3*  --  11.9* 11.9*  --  8.8*  --   HGB 9.0*   < > 8.2* 7.7* 7.3* 8.2* 7.2*  HCT 28.2*   < > 25.9* 23.8* 22.5* 27.2* 22.5*  MCV 89.5   < > 89.3 91.2 87.9 93.8 91.1  PLT 297   < > 297 367 351 429* 378   < > = values in this interval not displayed.     Basic Metabolic Panel: Recent Labs  Lab 03/10/23 0110 03/12/23 0105 03/13/23 0127 03/14/23 0950 03/18/2023 1007 03/16/23 0211  NA 132* 135 133* 138 135 134*  K 4.9 4.5 4.7 4.1 4.8 3.5  CL 89* 94* 92* 91* 92* 92*  CO2 26 24 26 25 26 28   GLUCOSE 103* 81 98 123* 104* 97  BUN 56* 27* 50* 29* 46* 19  CREATININE 6.11* 3.55* 5.21* 3.77* 5.23* 3.02*  CALCIUM 10.1 10.4* 10.5* 10.4* 10.4* 9.4  MG 1.8 1.7 1.9  --   --   --   PHOS 5.7* 4.4 4.4 4.2 4.9* 3.3     GFR: Estimated Creatinine Clearance: 12.3 mL/min (A) (by C-G formula based on SCr of 3.02 mg/dL (H)).  Liver Function Tests: Recent Labs  Lab  03/10/23 0110 03/12/23 0105 03/13/23 0127 03/14/23 0950 03/14/2023 1007 03/16/23 0211  AST 21 19 21   --   --   --   ALT 6 7 10   --   --   --   ALKPHOS 58 54 61  --   --   --   BILITOT 0.6 0.8 0.5  --   --   --   PROT 6.5 7.0 6.7  --   --   --   ALBUMIN 2.3* 2.8* 2.5* 2.3* 2.3* 2.0*     CBG: No results for input(s): "GLUCAP" in the last 168 hours.   Recent Results (from the past 240 hour(s))  Culture, blood (Routine X 2) w Reflex to ID Panel     Status: None   Collection Time: 03/10/23  9:51 AM   Specimen: BLOOD  Result Value Ref Range Status   Specimen Description BLOOD SITE NOT SPECIFIED  Final   Special Requests   Final    BOTTLES DRAWN AEROBIC AND ANAEROBIC Blood Culture results may not be optimal due to an inadequate volume of blood received in culture bottles   Culture   Final    NO GROWTH 5 DAYS Performed at [facility], [address], [place], [place] [address]    Report Status 03/16/2023 FINAL  Final  Culture, blood (Routine X 2) w Reflex to ID Panel     Status: None   Collection Time: 03/10/23  9:51 AM   Specimen: BLOOD  Result Value Ref Range Status   Specimen Description BLOOD SITE NOT SPECIFIED  Final   Special Requests   Final    AEROBIC BOTTLE ONLY Blood Culture results may not be optimal due to an inadequate volume of blood received in culture bottles   Culture   Final    NO GROWTH 5 DAYS Performed at [facility], [address] [place], [place] [address]    Report Status 03/17/2023 FINAL  Final         Radiology Studies: No results found.      Scheduled Meds:  acetaminophen  650 mg Oral TID   ARIPiprazole  15 mg Oral Daily   bisacodyl  10 mg Rectal Daily   Chlorhexidine Gluconate Cloth  6 each Topical Q0600   [START ON 03/19/2023] darbepoetin (ARANESP) injection - NON-DIALYSIS  100 mcg Subcutaneous Q Tue-1800   docusate  100 mg Oral BID   feeding supplement  1 Container Oral TID BM   midodrine  10 mg Oral TID WC    mirtazapine  15 mg Oral QHS   multivitamin  1 tablet Oral QHS   mouth rinse  15 mL Mouth Rinse 4 times per day   pantoprazole (PROTONIX) IV  40 mg Intravenous Q12H   polyethylene glycol  17 g Oral BID   senna-docusate  1 tablet Oral BID   sevelamer carbonate  1,600 mg Oral TID WC   sorbitol, milk of mag, mineral oil, glycerin (SMOG) enema  960 mL Rectal Once   valproic acid  250 mg Oral TID WC   Continuous Infusions:  piperacillin-tazobactam (ZOSYN)  IV 2.25 g (03/16/23 0903)     LOS: 18 days    Time spent: 35 minutes    Ramiro Harvest, MD [facility]   To contact the attending provider between 7A-7P or the covering provider during after hours 7P-7A, please log into the web site [email or web address] and access using universal Lorenzo password for that web site. If you do not have the password, please call the hospital operator.  03/16/2023, 12:50 PM

## 2023-03-17 LAB — RENAL FUNCTION PANEL
Albumin: 2 g/dL — ABNORMAL LOW (ref 3.5–5.0)
Albumin: 2 g/dL — ABNORMAL LOW (ref 3.5–5.0)
Anion gap: 15 (ref 5–15)
Anion gap: 16 — ABNORMAL HIGH (ref 5–15)
BUN: 29 mg/dL — ABNORMAL HIGH (ref 8–23)
BUN: 33 mg/dL — ABNORMAL HIGH (ref 8–23)
CO2: 28 mmol/L (ref 22–32)
CO2: 26 mmol/L (ref 22–32)
Calcium: 9.9 mg/dL (ref 8.9–10.3)
Calcium: 10.1 mg/dL (ref 8.9–10.3)
Chloride: 92 mmol/L — ABNORMAL LOW (ref 98–111)
Chloride: 91 mmol/L — ABNORMAL LOW (ref 98–111)
Creatinine, Ser: 4.47 mg/dL — ABNORMAL HIGH (ref 0.44–1.00)
Creatinine, Ser: 5.08 mg/dL — ABNORMAL HIGH (ref 0.44–1.00)
GFR, Estimated: 10 mL/min — ABNORMAL LOW (ref >60)
GFR, Estimated: 8 mL/min — ABNORMAL LOW (ref >60)
Glucose, Bld: 107 mg/dL — ABNORMAL HIGH (ref 70–99)
Glucose, Bld: 119 mg/dL — ABNORMAL HIGH (ref 70–99)
Phosphorus: 4.8 mg/dL — ABNORMAL HIGH (ref 2.5–4.6)
Phosphorus: 5.2 mg/dL — ABNORMAL HIGH (ref 2.5–4.6)
Potassium: 3.5 mmol/L (ref 3.5–5.1)
Potassium: 3.9 mmol/L (ref 3.5–5.1)
Sodium: 135 mmol/L (ref 135–145)
Sodium: 133 mmol/L — ABNORMAL LOW (ref 135–145)

## 2023-03-17 LAB — CBC
HCT: 22.1 % — ABNORMAL LOW (ref 36.0–46.0)
HCT: 23.7 % — ABNORMAL LOW (ref 36.0–46.0)
Hemoglobin: 7 g/dL — ABNORMAL LOW (ref 12.0–15.0)
Hemoglobin: 7.4 g/dL — ABNORMAL LOW (ref 12.0–15.0)
MCH: 29 pg (ref 26.0–34.0)
MCH: 28.9 pg (ref 26.0–34.0)
MCHC: 31.7 g/dL (ref 30.0–36.0)
MCHC: 31.2 g/dL (ref 30.0–36.0)
MCV: 91.7 fL (ref 80.0–100.0)
MCV: 92.6 fL (ref 80.0–100.0)
Platelets: 430 10*3/uL — ABNORMAL HIGH (ref 150–400)
Platelets: 434 10*3/uL — ABNORMAL HIGH (ref 150–400)
RBC: 2.41 MIL/uL — ABNORMAL LOW (ref 3.87–5.11)
RBC: 2.56 MIL/uL — ABNORMAL LOW (ref 3.87–5.11)
RDW: 19.1 % — ABNORMAL HIGH (ref 11.5–15.5)
RDW: 18.9 % — ABNORMAL HIGH (ref 11.5–15.5)
WBC: 13.4 10*3/uL — ABNORMAL HIGH (ref 4.0–10.5)
WBC: 14.8 10*3/uL — ABNORMAL HIGH (ref 4.0–10.5)
nRBC: 0 % (ref 0.0–0.2)
nRBC: 0 % (ref 0.0–0.2)

## 2023-03-17 LAB — VITAMIN B1: Vitamin B1 (Thiamine): 131.2 nmol/L (ref 66.5–200.0)

## 2023-03-17 LAB — MAGNESIUM: Magnesium: 1.7 mg/dL (ref 1.7–2.4)

## 2023-03-17 MED ORDER — ANTICOAGULANT SODIUM CITRATE 4% (200MG/5ML) IV SOLN: 5.0000 mL | Status: DC | PRN

## 2023-03-17 MED ORDER — HEPARIN SODIUM (PORCINE) 1000 UNIT/ML DIALYSIS: 1000.0000 [IU] | INTRAMUSCULAR | Status: DC | PRN

## 2023-03-17 MED ORDER — PENTAFLUOROPROP-TETRAFLUOROETH EX AERO: 1.0000 | INHALATION_SPRAY | CUTANEOUS | Status: DC | PRN

## 2023-03-17 MED ORDER — LIDOCAINE HCL (PF) 1 % IJ SOLN: 5.0000 mL | INTRAMUSCULAR | Status: DC | PRN

## 2023-03-17 MED ORDER — LIDOCAINE-PRILOCAINE 2.5-2.5 % EX CREA: 1.0000 | TOPICAL_CREAM | CUTANEOUS | Status: DC | PRN

## 2023-03-17 MED ORDER — ALTEPLASE 2 MG IJ SOLR: 2.0000 mg | Freq: Once | INTRAMUSCULAR | Status: DC | PRN

## 2023-03-17 MED ORDER — CHLORHEXIDINE GLUCONATE CLOTH 2 % EX PADS
6.0000 | MEDICATED_PAD | Freq: Every day | CUTANEOUS | Status: DC
  Administered 2023-03-17 – 2023-03-24 (×7): 6 via TOPICAL

## 2023-03-17 NOTE — Progress Notes (Signed)
Madison [name] NEPHROLOGY PROGRESS NOTE  Assessment/ Plan:  # s/p hemiarthroplasty for left hip fracture: plan for SNF, rehab.   # ESRD: On HD per MWF schedule (previously [facility] in [place]). -She had dialysis on 6/21 but it seems like she did not receive HD in chair.  We need to make sure that the patient receives dialysis in the chair on Monday. She looks frail and deconditioning on exam.  Discussed with renal navigator.  # Anemia CKD: Very high ferritin level.  Continue Aranesp and monitor hemoglobin.  Transfuse as needed.  # CKD-MBD: hyperphosphatemia - on sevelamer.  Will need a reduced ca bath for now if able (unfortunately we are limited inpatient).  Phosphorus level improved.  # Hypertension: Blood pressure low, on metoprolol.   #Bright red blood per rectum, proctitis. - Note that GI has been consulted.  Antibiotics per primary team discretion   # Disposition - she is changing SNF's and will need a new outpatient dialysis unit.  (She is not able to be discharged without a new outpatient HD unit confirmed).  Note that at this time she is not acceptable for outpatient HD. She needs to be able to communicate her needs in the outpatient dialysis setting and able to sit on recliner or chair for HD.  Subjective: Seen and examined at bedside.  No new event.  Denies chest pain, shortness of breath, nausea or vomiting.  Objective Vital signs in last 24 hours: Vitals:   03/16/23 1852 03/16/23 2051 03/17/23 0500 03/17/23 0551  BP: (!) 118/59 120/61  103/60  Pulse: 91 88  81  Resp: 19 (!) 22  20  Temp: 98.8 F (37.1 C) 98.3 F (36.8 C)  98.1 F (36.7 C)  TempSrc: Axillary Axillary  Axillary  SpO2: 99% 99%  96%  Weight:   47 kg   Height:       Weight change: 0.1 kg  Intake/Output Summary (Last 24 hours) at 03/17/2023 1049 Last data filed at 03/16/2023 2000 Gross per 24 hour  Intake 350 ml  Output --  Net 350 ml        Labs: RENAL  PANEL Recent Labs  Lab 03/12/23 0105 03/13/23 0127 03/14/23 0950 03/17/2023 1007 03/16/23 0211 03/17/23 0041  NA 135 133* 138 135 134* 135  K 4.5 4.7 4.1 4.8 3.5 3.5  CL 94* 92* 91* 92* 92* 92*  CO2 24 26 25 26 28 28   GLUCOSE 81 98 123* 104* 97 107*  BUN 27* 50* 29* 46* 19 29*  CREATININE 3.55* 5.21* 3.77* 5.23* 3.02* 4.47*  CALCIUM 10.4* 10.5* 10.4* 10.4* 9.4 9.9  MG 1.7 1.9  --   --   --  1.7  PHOS 4.4 4.4 4.2 4.9* 3.3 4.8*  ALBUMIN 2.8* 2.5* 2.3* 2.3* 2.0* 2.0*     Liver Function Tests: Recent Labs  Lab 03/12/23 0105 03/13/23 0127 03/14/23 0950 03/09/2023 1007 03/16/23 0211 03/17/23 0041  AST 19 21  --   --   --   --   ALT 7 10  --   --   --   --   ALKPHOS 54 61  --   --   --   --   BILITOT 0.8 0.5  --   --   --   --   PROT 7.0 6.7  --   --   --   --   ALBUMIN 2.8* 2.5*   < > 2.3* 2.0* 2.0*   < > = values  in this interval not displayed.    No results for input(s): "LIPASE", "AMYLASE" in the last 168 hours. Recent Labs  Lab 03/21/2023 1007  AMMONIA 15    CBC: Recent Labs    03/13/23 0127 03/14/23 0045 03/07/2023 1030 03/16/23 0211 03/17/23 0041  HGB 7.7* 7.3* 8.2* 7.2* 7.0*  MCV 91.2 87.9 93.8 91.1 91.7  VITAMINB12  --  1,108*  --   --   --   FOLATE  --  26.1  --   --   --   FERRITIN  --  4,206*  --   --   --   TIBC  --  116*  --   --   --   IRON  --  15*  --   --   --   RETICCTPCT  --  0.9  --   --   --      Cardiac Enzymes: Recent Labs  Lab 03/16/2023 2121  CKTOTAL 17*    CBG: No results for input(s): "GLUCAP" in the last 168 hours.  Iron Studies:  No results for input(s): "IRON", "TIBC", "TRANSFERRIN", "FERRITIN" in the last 72 hours.  Studies/Results: No results found.  Medications: Infusions:  piperacillin-tazobactam (ZOSYN)  IV Stopped (03/17/23 1027)    Scheduled Medications:  acetaminophen  650 mg Oral TID   ARIPiprazole  15 mg Oral Daily   bisacodyl  10 mg Rectal Daily   Chlorhexidine Gluconate Cloth  6 each Topical Q0600    [START ON 03/19/2023] darbepoetin (ARANESP) injection - NON-DIALYSIS  100 mcg Subcutaneous Q Tue-1800   docusate  100 mg Oral BID   feeding supplement  1 Container Oral TID BM   midodrine  10 mg Oral TID WC   mirtazapine  15 mg Oral QHS   multivitamin  1 tablet Oral QHS   mouth rinse  15 mL Mouth Rinse 4 times per day   pantoprazole (PROTONIX) IV  40 mg Intravenous Q12H   polyethylene glycol  17 g Oral BID   senna-docusate  1 tablet Oral BID   sevelamer carbonate  1,600 mg Oral TID WC   valproic acid  250 mg Oral TID WC    have reviewed scheduled and prn medications.  Physical Exam: General: Elderly looking female, lying on bed. Heart:RRR, s1s2 nl Lungs:clear b/l, no crackle Abdomen:soft, Non-tender, non-distended Extremities:No edema Dialysis Access: Left upper extremity AV fistula+  Kellar Westberg Prasad Mayme Profeta 03/17/2023,10:49 AM  LOS: 19 days

## 2023-03-17 NOTE — Progress Notes (Signed)
PROGRESS NOTE    Madison [name]  WGN:[id number] DOB: 1951/05/19 DOA: 02/24/2023 PCP: Charlynne Pander, MD    No chief complaint on file.   Brief Narrative:  The patient is a 72 year old SNF resident with past medical history significant for but limited to ESRD on hemodialysis Monday Wednesday Friday, hypertension, history of schizophrenia versus bipolar disorder as well as other comorbidities who presented to the [facility] ER after a fall which occurred multiple days prior.  X-rays at [facility] ER revealed a left femoral fracture and she was transferred to [facility] given that she needed a combination of the orthopedist as well as inpatient dialysis.  She underwent unipolar Hemi arthroplasty of the left hip on 03/14/2023 and has been deemed stable from a orthopedic standpoint.  She had issues with her dialysis catheter which has not been fixed and she is getting dialysis.  She was extremely agitated and hysterically crying early on during the hospitalization and remained emotionally labile and tearful however was calmer.   We were anticipating discharging back to SNF with resumption of her normal dialysis session on Friday, 03/08/2023 however a barrier to discharge was that the patient will need to be able to tolerate sitting in a chair for 4 to 5 hours to be appropriate for outpatient HD for discharge.  She was able to dialyze in the chair early on during the hospitalization, appropriately and had no issues with that but heart rate remained elevated in the setting of pain and agitation so she was initiated on a beta-blocker and given some pain medication.    Heart rate was improved however she now has a slight leukocytosis and after repeat is trending down.  TOC attempted to call the facility and got an response so unfortunately could not discharge given that no one at the bedside picked up.  Case worker is informed that the patient's legal guardian was unhappy with the SNF choice and requested to change her  SNF placement to [facility].  [facility] is able to take the patient at their facility  however because of the symptoms changed her dialysis center will need to be moved.     CBC continue to further worsen as she had bright red blood per rectum and was found to be impacted.  Having large stool ball and her bowel regimen has been escalated and obtained CT scan.  GI was consulted given her hemoglobin and bleeding and has bleeding noted to be subsiding, GI not recommending endoscopic procedures at this time.  GI feels patient bleeding likely secondary to inflammation in the rectum due to stercoral colitis, and the meds recommended for patient's constipation a few no further additional bleeding at this time..  Will need to continue monitor carefully.     Assessment & Plan:   Principal Problem:   ESRD on hemodialysis (HCC) Active Problems:   Schizoaffective disorder, bipolar type (HCC)   Hip fracture, left (HCC)   Malnutrition of moderate degree   Hematochezia   Constipation   Gastroesophageal reflux disease   BRBPR (bright red blood per rectum)   Leukocytosis   Sinus tachycardia   Anemia due to chronic kidney disease, on chronic dialysis (HCC)  #1 left femoral neck fracture -Secondary to mechanical fall. -Patient seen in consultation by orthopedics underwent unipolar hemiarthroplasty of the left hip 03/03/2023 without any significant complications. -Continue current pain regimen of oxycodone 5 to 10 mg every 4 hours as needed moderate pain, acetaminophen 650 mg 3 times daily, fentanyl 12.5 mg  every 2 hours as needed for severe pain. -Orthopedics recommending WBAT left lower extremity with assistance and posterior hip precautions for 12 weeks with daily wound care as needed and DVT prophylaxis with subcu heparin x 28 days. -Continue current bowel regimen. -Outpatient follow-up with orthopedic surgery 10 days postdischarge.  2.  Schizoaffective disorder/bipolar type with uncontrollable  crying and agitation intermittently/acute metabolic encephalopathy -Continue home regimen Abilify, Depakote, Cogentin.  -Patient noted to have been extremely agitated and anxious a few days ago however noted to be drowsy on 03/13/2023, alert 03/14/2023 however tearful and noted to have intermittent agitation and confusion.   -Patient also with some incomprehensible speech.  Patient difficult to redirect. -RN concerned about patient's inability to swallow, SLP evaluated patient patient cleared for regular diet. -Continue mirtazapine 15 mg nightly. -Haldol as needed. -Continue treatment for probable pneumonia.   -Psychiatry consulted for further management of schizoaffective disorder/bipolar disorder with bouts of uncontrollable crying and intermittent agitation.   -Patient seen by psychiatry, labs ordered with a CK level within normal limits, ammonia level of 15,valproic acid level < 10. -Continue current regimen of Abilify, Depakote.   -Psychiatry was following but signed off on 02/25/2023.   -Will have psych reassess patient as patient not improving in terms of her mental status with episodes of agitation and tearfulness with incomprehensible speech and confusion with difficulty redirecting.   --TSH noted at 5.299.  Vitamin B12 of 1108, folate of 26.1. -Other reversible causes have been ruled out.   -Patient on empiric IV antibiotics, for presumed pneumonia.  3.  ESRD on HD Monday Wednesday Friday/hyperphosphatemia improved with dialysis/elevated anion gap -Patient noted with ongoing issues with dialysis cannulation which has been corrected post ultrasound-guided access of left upper extremity fistula for balloon angioplasty of outflow stenosis at the axillary vein with no residual on completion per IR 03/04/2023.. -Continue sevelamer 1600 mg 3 times daily, avoid nephrotoxic medications, contrast dyes, hypotension and dehydration to ensure adequate renal perfusion.  -Nephrology following and feel  patient needs to be dialyzed in the chair prior to being discharged and needs improved mental status prior to being set up for outpatient dialysis.  -Per nephrology.  4.  Sinus tachycardia -Noted in the setting of GI bleed constipation. -CT abdomen and pelvis with concerns for constipation, rectal wall thickening, perirectal stranding, fecal impaction. -Was on a beta-blocker however beta-blocker discontinued secondary to soft blood pressure.    5.  Constipation and fecal impaction -CT abdomen and pelvis done with large amount of stool in the rectum, wall thickening in the rectum perirectal stranding, findings suggesting fecal impaction and possible proctitis. -No evidence of intestinal obstruction or pneumoperitoneum, no hydronephrosis. -Patient seen in consultation by GI. -Patient received milk of molasses enema with good results on 03/12/2023.   -Patient given another milk and molasses enema and a smog enema on 03/13/2023 with large amount of stool noted with no blood in it.  -Continue current bowel regimen of MiraLAX twice daily, Senokot-S twice daily. -Patient seen by GI and underwent flexible sigmoidoscopy 03/18/2023 which showed large amount of solid stool in the rectum, partially cleared, 15 mm polyp in the rectum biopsied, multiple ulcers in the rectum biopsied. -Post flex sigmoidoscopy GI recommending ongoing bowel regimen and enemas for further stool evacuation and to avoid constipation as this will worsen stercoral ulcers, outpatient follow-up with [facility] vs [facility] for consideration of EGD/colonoscopy for further evaluation of anemia and removal of colon polyps in the future once patient has recovered from  her recent hip fracture.  -GI following.  6.??  Pneumonia -CT scan done showed small new patchy infiltrates in both lower lower lung fields suggest atelectasis/pneumonia.  Small bilateral pleural effusions more on the left.  Small pericardial effusion, coronary  artery disease. -Patient noted to be drowsy not awake enough to use a flutter valve incentive spirometry on 03/13/2023. -Patient more alert today however confused... -SLP assessed patient placed on a regular diet. -Patient with a leukocytosis white count of 14.9, repeat chest x-ray done 03/13/2023 with lower lung volumes with increasing patchy left basilar airspace disease, suspicious for pneumonia.  Small bilateral effusions. -Leukocytosis trending down. -Due to patient's drowsiness and leukocytosis patient started empirically on IV Zosyn to complete a course of antibiotic treatment.    7.  Leukocytosis in the setting of BRBPR -Initially felt to be reactive but trended up and now in the setting of likely GI bleed and constipation concerns for stercoral colitis. -Repeat chest x-ray done 03/13/2023, with increasing infiltrate concerning for pneumonia. -Blood cultures ordered and negative x 5 days.  -CT abdomen and pelvis done with large amount of stool in the rectum, wall thickening in the rectum and perirectal stranding findings suggestive fecal impaction and possible proctitis.  No evidence of intestinal obstruction or pneumoperitoneum.  No hydronephrosis. -Continue empiric IV Zosyn.    8.  Anemia of chronic kidney disease/postop anemia/BRBPR and concern for GI bleed (Likely stercoral colitis) -Patient with no significant further bleeding. -Patient seen in consultation by GI who recommended adjusting bowel regimen and continue to monitor hemoglobin. -Patient with no known or significant further bloody bowel movements. -Hemoglobin slowly trending down currently at 8.2. -Patient receiving Darbepoetin alfa 60 mcg q. Mondays. -Patient seen in consultation by GI and patient underwent flexible sigmoidoscopy 03/21/2023 which showed large amount of solid stool in the rectum, partially cleared, 15 mm polyp in the rectum biopsied, multiple ulcers in the rectum biopsied. -Post flex sigmoidoscopy GI  recommending ongoing bowel regimen and enemas for further stool evacuation and to avoid constipation as this will worsen stercoral ulcers, outpatient follow-up with [facility] versus [facility] for consideration of EGD/colonoscopy for further evaluation of anemia and removal of colon polyps in the future once patient has recovered from her recent hip fracture. -Follow H&H, transfusion threshold hemoglobin < 7.   9.  Nonsevere protein calorie malnutrition, moderate -Secondary to chronic illnesses (ESRD, schizoaffective disorder) as evidenced by moderate fat depletion depletion. -Dietitian following, may need to engage other forms of nutrition if patient continues to refuse food and may consider consultation with palliative care.  10.  GERD -PPI.   11.  Hypoalbuminemia - Follow.  12.  Soft blood pressure -Patient noted to be on midodrine 15 mg twice daily prior to admission. -Midodrine resumed and dose uptitrated to 10 mg 3 times daily for better blood pressure control.  -Beta-blocker discontinued. -BP improving.   DVT prophylaxis: SCDs Code Status: Full Family Communication: Updated patient.  No family at bedside. Disposition: Likely needs SNF when medically stable, and able to tolerate outpatient hemodialysis with improvement with mental status..  Status is: Inpatient Remains inpatient appropriate because: Severity of illness   Consultants:  Nephrology: Dr. Ronalee Belts 02/27/2023 Orthopedics: Dr. Carola Frost 03/22/2023 Interventional radiology: Dr. Loreta Ave 03/04/2023 Gastroenterology: Dr. Leonides Schanz 03/11/2023 Psychiatry Palliative care: Dr. Patterson Hammersmith 03/16/2023  Procedures:  CT abdomen pelvis 03/12/2023 Chest x-ray 2023/03/02, 03/10/2023, 03/13/2023 Abdominal x-rays 03/13/2023 Ultrasound-guided access left upper extremity fistula for balloon angioplasty of the outflow stenosis at the axillary vein with no  residual on completion Through maintained: Per IR Dr. Loreta Ave 03/04/2023 Plain films of  the left hip 03/12/2023 Unipolar hemiarthroplasty on the left hip: Per orthopedics: Dr. Carola Frost 03/03/2023 Flexible sigmoidoscopy: Per Dr. Leonides Schanz 03/19/2023  Antimicrobials:  Anti-infectives (From admission, onward)    Start     Dose/Rate Route Frequency Ordered Stop   03/14/23 1400  piperacillin-tazobactam (ZOSYN) IVPB 2.25 g        2.25 g 100 mL/hr over 30 Minutes Intravenous Every 8 hours 03/14/23 0723     03/13/23 1700  piperacillin-tazobactam (ZOSYN) IVPB 2.25 g  Status:  Discontinued        2.25 g 100 mL/hr over 30 Minutes Intravenous Every 6 hours 03/13/23 1607 03/14/23 0723   03/13/23 1645  piperacillin-tazobactam (ZOSYN) IVPB 3.375 g  Status:  Discontinued        3.375 g 12.5 mL/hr over 240 Minutes Intravenous Every 8 hours 03/13/23 1557 03/13/23 1607   03/03/2023 1430  ceFAZolin (ANCEF) IVPB 2g/100 mL premix  Status:  Discontinued        2 g 200 mL/hr over 30 Minutes Intravenous Every 6 hours 03/05/2023 1334 03/01/2023 1344   02/25/2023 1147  vancomycin (VANCOCIN) powder  Status:  Discontinued          As needed 03/03/2023 1147 03/10/2023 1216   03/10/2023 0830  ceFAZolin (ANCEF) IVPB 2g/100 mL premix        2 g 200 mL/hr over 30 Minutes Intravenous On call to O.R. 03/22/2023 0731 02/24/2023 1033   03/05/2023 0830  vancomycin (VANCOCIN) IVPB 1000 mg/200 mL premix        1,000 mg 200 mL/hr over 60 Minutes Intravenous On call to O.R. 03/11/2023 0731 02/27/2023 0858         Subjective: Patient laying in bed.  Confused.  Tearful.     Objective: Vitals:   03/16/23 1852 03/16/23 2051 03/17/23 0500 03/17/23 0551  BP: (!) 118/59 120/61  103/60  Pulse: 91 88  81  Resp: 19 (!) 22  20  Temp: 98.8 F (37.1 C) 98.3 F (36.8 C)  98.1 F (36.7 C)  TempSrc: Axillary Axillary  Axillary  SpO2: 99% 99%  96%  Weight:   47 kg   Height:        Intake/Output Summary (Last 24 hours) at 03/17/2023 1416 Last data filed at 03/17/2023 1100 Gross per 24 hour  Intake 590 ml  Output 0 ml  Net 590 ml    Filed  Weights   02/25/2023 1457 03/16/23 0500 03/17/23 0500  Weight: 46.8 kg 47 kg 47 kg    Examination:  General exam: NAD. Respiratory system: CTAB.  No wheezes, no crackles, no rhonchi.  Fair air movement.  Speaking in full sentences.  Cardiovascular system: RRR no murmurs rubs or gallops.  No JVD.  No lower extremity edema.   Gastrointestinal system: Abdomen abdomen is soft, nontender, nondistended, positive bowel sounds.  No rebound.  Nontender.   Central nervous system: Alert. Tearful.  Confused.  Moving extremities spontaneously.  No focal neurological deficits. Extremities: Symmetric 5 x 5 power. Skin: No rashes, lesions or ulcers Psychiatry: Judgement and insight poor. Mood and affect unable to assess.      Data Reviewed: I have personally reviewed following labs and imaging studies  CBC: Recent Labs  Lab 03/12/23 0150 03/13/23 0127 03/14/23 0045 03/18/2023 1030 03/16/23 0211 03/17/23 0041 03/17/23 1124  WBC 14.3* 14.9* 12.1* 11.2* 12.0* 13.4* 14.8*  NEUTROABS 11.9* 11.9*  --  8.8*  --   --   --  HGB 8.2* 7.7* 7.3* 8.2* 7.2* 7.0* 7.4*  HCT 25.9* 23.8* 22.5* 27.2* 22.5* 22.1* 23.7*  MCV 89.3 91.2 87.9 93.8 91.1 91.7 92.6  PLT 297 367 351 429* 378 430* 434*     Basic Metabolic Panel: Recent Labs  Lab 03/12/23 0105 03/13/23 0127 03/14/23 0950 03/01/2023 1007 03/16/23 0211 03/17/23 0041 03/17/23 1124  NA 135 133* 138 135 134* 135 133*  K 4.5 4.7 4.1 4.8 3.5 3.5 3.9  CL 94* 92* 91* 92* 92* 92* 91*  CO2 24 26 25 26 28 28 26   GLUCOSE 81 98 123* 104* 97 107* 119*  BUN 27* 50* 29* 46* 19 29* 33*  CREATININE 3.55* 5.21* 3.77* 5.23* 3.02* 4.47* 5.08*  CALCIUM 10.4* 10.5* 10.4* 10.4* 9.4 9.9 10.1  MG 1.7 1.9  --   --   --  1.7  --   PHOS 4.4 4.4 4.2 4.9* 3.3 4.8* 5.2*     GFR: Estimated Creatinine Clearance: 7.3 mL/min (A) (by C-G formula based on SCr of 5.08 mg/dL (H)).  Liver Function Tests: Recent Labs  Lab 03/12/23 0105 03/13/23 0127 03/14/23 0950  03/14/2023 1007 03/16/23 0211 03/17/23 0041 03/17/23 1124  AST 19 21  --   --   --   --   --   ALT 7 10  --   --   --   --   --   ALKPHOS 54 61  --   --   --   --   --   BILITOT 0.8 0.5  --   --   --   --   --   PROT 7.0 6.7  --   --   --   --   --   ALBUMIN 2.8* 2.5* 2.3* 2.3* 2.0* 2.0* 2.0*     CBG: No results for input(s): "GLUCAP" in the last 168 hours.   Recent Results (from the past 240 hour(s))  Culture, blood (Routine X 2) w Reflex to ID Panel     Status: None   Collection Time: 03/10/23  9:51 AM   Specimen: BLOOD  Result Value Ref Range Status   Specimen Description BLOOD SITE NOT SPECIFIED  Final   Special Requests   Final    BOTTLES DRAWN AEROBIC AND ANAEROBIC Blood Culture results may not be optimal due to an inadequate volume of blood received in culture bottles   Culture   Final    NO GROWTH 5 DAYS Performed at [facility], [address] [place], [place] [address]    Report Status 03/02/2023 FINAL  Final  Culture, blood (Routine X 2) w Reflex to ID Panel     Status: None   Collection Time: 03/10/23  9:51 AM   Specimen: BLOOD  Result Value Ref Range Status   Specimen Description BLOOD SITE NOT SPECIFIED  Final   Special Requests   Final    AEROBIC BOTTLE ONLY Blood Culture results may not be optimal due to an inadequate volume of blood received in culture bottles   Culture   Final    NO GROWTH 5 DAYS Performed at [facility], [address] [place], [place] [address]    Report Status 03/16/2023 FINAL  Final         Radiology Studies: No results found.      Scheduled Meds:  acetaminophen  650 mg Oral TID   ARIPiprazole  15 mg Oral Daily   bisacodyl  10 mg Rectal Daily   Chlorhexidine  Gluconate Cloth  6 each Topical Q0600   Chlorhexidine Gluconate Cloth  6 each Topical Q0600   [START ON 03/19/2023] darbepoetin (ARANESP) injection - NON-DIALYSIS  100 mcg Subcutaneous Q Tue-1800   docusate  100 mg Oral BID   feeding supplement  1  Container Oral TID BM   midodrine  10 mg Oral TID WC   mirtazapine  15 mg Oral QHS   multivitamin  1 tablet Oral QHS   mouth rinse  15 mL Mouth Rinse 4 times per day   pantoprazole (PROTONIX) IV  40 mg Intravenous Q12H   polyethylene glycol  17 g Oral BID   senna-docusate  1 tablet Oral BID   sevelamer carbonate  1,600 mg Oral TID WC   valproic acid  250 mg Oral TID WC   Continuous Infusions:  anticoagulant sodium citrate     piperacillin-tazobactam (ZOSYN)  IV Stopped (03/17/23 1027)     LOS: 19 days    Time spent: 35 minutes    Ramiro Harvest, MD [facility]   To contact the attending provider between 7A-7P or the covering provider during after hours 7P-7A, please log into the web site [email or web address] and access using universal [place] password for that web site. If you do not have the password, please call the hospital operator.  03/17/2023, 2:16 PM

## 2023-03-17 NOTE — Consult Note (Signed)
WOC Nurse Consult Note: Reason for Consult: partial thickness abrasion to right buttock  Wound type:trauma Pressure Injury POA: N/A Measurement:2c x 1cm with no depth. Wound NFA:OZHY, intact Drainage (amount, consistency, odor)  Periwound:intact Dressing procedure/placement/frequency: I have provided nursing with guidance in the care of this lesion.  Discussed with Bedside RN N. Warden via [employer]. Topical care will be to provide a daily cleanse with NS, pat dry. Cover lesion with xeroform gauze, top with silicone foam. Change xeroform daily, may change silicone foam PRN for rolling of dressing edges and for soiling. A pressure redistribution chair cushion is provided for OOB in chair use and is to be sent with patient at time of discharge. Heels are to be floated and turning and repositioning should minimize time in the supine position.  WOC nursing team will not follow, but will remain available to this patient, the nursing and medical teams.  Please re-consult if needed.  Thank you for inviting [place] to participate in this patient's Plan of Care.  Ladona Mow, MSN, RN, CNS, GNP, Leda Min, [employer], [employer] phone:  [phone]

## 2023-03-18 ENCOUNTER — Telehealth: Payer: Self-pay

## 2023-03-18 LAB — CBC
HCT: 21 % — ABNORMAL LOW (ref 36.0–46.0)
Hemoglobin: 6.7 g/dL — CL (ref 12.0–15.0)
MCH: 28.4 pg (ref 26.0–34.0)
MCHC: 31.9 g/dL (ref 30.0–36.0)
MCV: 89 fL (ref 80.0–100.0)
Platelets: 433 10*3/uL — ABNORMAL HIGH (ref 150–400)
RBC: 2.36 MIL/uL — ABNORMAL LOW (ref 3.87–5.11)
RDW: 18.9 % — ABNORMAL HIGH (ref 11.5–15.5)
WBC: 14.2 10*3/uL — ABNORMAL HIGH (ref 4.0–10.5)
nRBC: 0 % (ref 0.0–0.2)

## 2023-03-18 LAB — RENAL FUNCTION PANEL
Albumin: 1.9 g/dL — ABNORMAL LOW (ref 3.5–5.0)
Anion gap: 16 — ABNORMAL HIGH (ref 5–15)
BUN: 43 mg/dL — ABNORMAL HIGH (ref 8–23)
CO2: 25 mmol/L (ref 22–32)
Calcium: 9.8 mg/dL (ref 8.9–10.3)
Chloride: 91 mmol/L — ABNORMAL LOW (ref 98–111)
Creatinine, Ser: 6.28 mg/dL — ABNORMAL HIGH (ref 0.44–1.00)
GFR, Estimated: 7 mL/min — ABNORMAL LOW (ref >60)
Glucose, Bld: 99 mg/dL (ref 70–99)
Phosphorus: 5.2 mg/dL — ABNORMAL HIGH (ref 2.5–4.6)
Potassium: 3.8 mmol/L (ref 3.5–5.1)
Sodium: 132 mmol/L — ABNORMAL LOW (ref 135–145)

## 2023-03-18 LAB — TYPE AND SCREEN
ABO/RH(D): B POS
Unit division: 0

## 2023-03-18 LAB — BPAM RBC: Unit Type and Rh: 7300

## 2023-03-18 LAB — HEMOGLOBIN AND HEMATOCRIT, BLOOD
HCT: 25.5 % — ABNORMAL LOW (ref 36.0–46.0)
Hemoglobin: 8.5 g/dL — ABNORMAL LOW (ref 12.0–15.0)

## 2023-03-18 LAB — SURGICAL PATHOLOGY

## 2023-03-18 LAB — PREPARE RBC (CROSSMATCH)

## 2023-03-18 MED ORDER — VALPROATE SODIUM 100 MG/ML IV SOLN
250.0000 mg | Freq: Three times a day (TID) | INTRAVENOUS | Status: DC
  Filled 2023-03-18 (×3): qty 2.5

## 2023-03-18 MED ORDER — VALPROATE SODIUM 100 MG/ML IV SOLN
250.0000 mg | Freq: Two times a day (BID) | INTRAVENOUS | Status: DC
  Administered 2023-03-19 – 2023-03-23 (×8): 250 mg via INTRAVENOUS
  Filled 2023-03-18: qty 250
  Filled 2023-03-18 (×2): qty 2.5
  Filled 2023-03-18: qty 250
  Filled 2023-03-18 (×7): qty 2.5

## 2023-03-18 MED ORDER — DARBEPOETIN ALFA 100 MCG/0.5ML IJ SOSY
100.0000 ug | PREFILLED_SYRINGE | INTRAMUSCULAR | Status: DC
  Administered 2023-03-20: 100 ug via SUBCUTANEOUS
  Filled 2023-03-18: qty 0.5

## 2023-03-18 MED ORDER — SODIUM CHLORIDE 0.9% IV SOLUTION: Freq: Once | INTRAVENOUS | Status: DC

## 2023-03-18 NOTE — Telephone Encounter (Signed)
I called patient per provider request to schedule follow up visit no answer left message on voicemail to call office.

## 2023-03-18 NOTE — Progress Notes (Signed)
[facility] Progress Note    Assessment/ Plan:   s/p hemiarthroplasty for left hip fracture: plan for SNF, rehab.    ESRD: On HD per MWF schedule (previously [facility] in [place]). -HD on MWF schedule, will try for HD in chair on Wed   Anemia CKD: Very high ferritin level.  Continue Aranesp and monitor hemoglobin.  Transfuse as needed for hgb <7. Hgb 6.7 today, to receive prbc   CKD-MBD: hyperphosphatemia - on sevelamer.  Will need a reduced ca bath for now if able (unfortunately we are limited inpatient).  Phosphorus level stable 5.2   Hypertension: Blood pressure low, on midodrine 10mg  TID. Uf'ing as tolerated with HD   Bright red blood per rectum, proctitis, fecal impaction - per primary service. S/p flex sig 6/21-multiple rectal ulcers, large amount of solid stool in rectum   # Disposition - she is changing SNF's and will need a new outpatient dialysis unit.  (She is not able to be discharged without a new outpatient HD unit confirmed).  Note that at this time she is not acceptable for outpatient HD. She needs to be able to communicate her needs in the outpatient dialysis setting and able to sit on recliner or chair for HD.  Subjective:   Patient seen and examined on dialysis. When asked how she is doing, she reports feeling not good but not elaborating further Dialysis is currently happening in bed. When asked HD staff, apparently can't find recliner.   Objective:   BP (!) 130/49   Pulse 100   Temp 97.7 F (36.5 C)   Resp 19   Ht 5\' 4"  (1.626 m)   Wt 48.7 kg   SpO2 100%   BMI 18.43 kg/m   Intake/Output Summary (Last 24 hours) at 03/18/2023 4098 Last data filed at 03/17/2023 2000 Gross per 24 hour  Intake 720 ml  Output 0 ml  Net 720 ml   Weight change: 2.3 kg  Physical Exam: Gen: chronically ill appearing CVS: RRR Resp: cta b/l, normal WOB Abd: soft, nt/nd Ext: no edema Neuro: awake Dialysis access: LUE AVF + b/t  Imaging: No  results found.  Labs: BMET Recent Labs  Lab 03/13/23 0127 03/14/23 0950 03/16/2023 1007 03/16/23 0211 03/17/23 0041 03/17/23 1124 03/18/23 0403  NA 133* 138 135 134* 135 133* 132*  K 4.7 4.1 4.8 3.5 3.5 3.9 3.8  CL 92* 91* 92* 92* 92* 91* 91*  CO2 26 25 26 28 28 26 25   GLUCOSE 98 123* 104* 97 107* 119* 99  BUN 50* 29* 46* 19 29* 33* 43*  CREATININE 5.21* 3.77* 5.23* 3.02* 4.47* 5.08* 6.28*  CALCIUM 10.5* 10.4* 10.4* 9.4 9.9 10.1 9.8  PHOS 4.4 4.2 4.9* 3.3 4.8* 5.2* 5.2*   CBC Recent Labs  Lab 03/12/23 0150 03/13/23 0127 03/14/23 0045 03/12/2023 1030 03/16/23 0211 03/17/23 0041 03/17/23 1124 03/18/23 0403  WBC 14.3* 14.9*   < > 11.2* 12.0* 13.4* 14.8* 14.2*  NEUTROABS 11.9* 11.9*  --  8.8*  --   --   --   --   HGB 8.2* 7.7*   < > 8.2* 7.2* 7.0* 7.4* 6.7*  HCT 25.9* 23.8*   < > 27.2* 22.5* 22.1* 23.7* 21.0*  MCV 89.3 91.2   < > 93.8 91.1 91.7 92.6 89.0  PLT 297 367   < > 429* 378 430* 434* 433*   < > = values in this interval not displayed.    Medications:     sodium  chloride   Intravenous Once   acetaminophen  650 mg Oral TID   ARIPiprazole  15 mg Oral Daily   bisacodyl  10 mg Rectal Daily   Chlorhexidine Gluconate Cloth  6 each Topical Q0600   Chlorhexidine Gluconate Cloth  6 each Topical Q0600   [START ON 03/19/2023] darbepoetin (ARANESP) injection - NON-DIALYSIS  100 mcg Subcutaneous Q Tue-1800   docusate  100 mg Oral BID   feeding supplement  1 Container Oral TID BM   midodrine  10 mg Oral TID WC   mirtazapine  15 mg Oral QHS   multivitamin  1 tablet Oral QHS   mouth rinse  15 mL Mouth Rinse 4 times per day   pantoprazole (PROTONIX) IV  40 mg Intravenous Q12H   polyethylene glycol  17 g Oral BID   senna-docusate  1 tablet Oral BID   sevelamer carbonate  1,600 mg Oral TID WC   valproic acid  250 mg Oral TID WC      Anthony Sar, MD [facility] 03/18/2023, 9:39 AM

## 2023-03-18 NOTE — Progress Notes (Signed)
PROGRESS NOTE    Madison [name]  NWG:[id number] DOB: 07/09/1951 DOA: 03/18/2023 PCP: Charlynne Pander, MD    No chief complaint on file.   Brief Narrative:  The patient is a 72 year old SNF resident with past medical history significant for but limited to ESRD on hemodialysis Monday Wednesday Friday, hypertension, history of schizophrenia versus bipolar disorder as well as other comorbidities who presented to the [facility] ER after a fall which occurred multiple days prior.  X-rays at [facility] ER revealed a left femoral fracture and she was transferred to [facility] given that she needed a combination of the orthopedist as well as inpatient dialysis.  She underwent unipolar Hemi arthroplasty of the left hip on 03/05/2023 and has been deemed stable from a orthopedic standpoint.  She had issues with her dialysis catheter which has not been fixed and she is getting dialysis.  She was extremely agitated and hysterically crying early on during the hospitalization and remained emotionally labile and tearful however was calmer.   We were anticipating discharging back to SNF with resumption of her normal dialysis session on Friday, 03/08/2023 however a barrier to discharge was that the patient will need to be able to tolerate sitting in a chair for 4 to 5 hours to be appropriate for outpatient HD for discharge.  She was able to dialyze in the chair early on during the hospitalization, appropriately and had no issues with that but heart rate remained elevated in the setting of pain and agitation so she was initiated on a beta-blocker and given some pain medication.    Heart rate was improved however she now has a slight leukocytosis and after repeat is trending down.  TOC attempted to call the facility and got an response so unfortunately could not discharge given that no one at the bedside picked up.  Case worker is informed that the patient's legal guardian was unhappy with the SNF choice and requested to change her  SNF placement to [facility].  [facility] is able to take the patient at their facility  however because of the symptoms changed her dialysis center will need to be moved.     CBC continue to further worsen as she had bright red blood per rectum and was found to be impacted.  Having large stool ball and her bowel regimen has been escalated and obtained CT scan.  GI was consulted given her hemoglobin and bleeding and has bleeding noted to be subsiding, GI not recommending endoscopic procedures at this time.  GI feels patient bleeding likely secondary to inflammation in the rectum due to stercoral colitis, and the meds recommended for patient's constipation a few no further additional bleeding at this time..  Will need to continue monitor carefully.     Assessment & Plan:   Principal Problem:   ESRD on hemodialysis (HCC) Active Problems:   Schizoaffective disorder, bipolar type (HCC)   Hip fracture, left (HCC)   Malnutrition of moderate degree   Hematochezia   Constipation   Gastroesophageal reflux disease   BRBPR (bright red blood per rectum)   Leukocytosis   Sinus tachycardia   Anemia due to chronic kidney disease, on chronic dialysis (HCC)  #1 left femoral neck fracture -Secondary to mechanical fall. -Patient seen in consultation by orthopedics underwent unipolar hemiarthroplasty of the left hip 03/03/2023 without any significant complications. -Continue current pain regimen of oxycodone 5 to 10 mg every 4 hours as needed moderate pain, acetaminophen 650 mg 3 times daily, fentanyl 12.5 mg  every 2 hours as needed for severe pain. -Orthopedics recommending WBAT left lower extremity with assistance and posterior hip precautions for 12 weeks with daily wound care as needed and DVT prophylaxis with subcu heparin x 28 days. -Continue current bowel regimen. -Outpatient follow-up with orthopedic surgery 10 days postdischarge.  2.  Schizoaffective disorder/bipolar type with uncontrollable  crying and agitation intermittently/acute metabolic encephalopathy -Continue home regimen Abilify, Depakote, Cogentin.  -Patient noted to have been extremely agitated and anxious a few days ago however noted to be drowsy on 03/13/2023, alert 03/14/2023 however tearful and noted to have intermittent agitation and confusion.   -Patient also with some incomprehensible speech.  Patient difficult to redirect. -RN concerned about patient's inability to swallow, SLP evaluated patient patient cleared for regular diet. -Continue mirtazapine 15 mg nightly. -Haldol as needed. -Continue treatment for probable pneumonia.   -Psychiatry consulted for further management of schizoaffective disorder/bipolar disorder with bouts of uncontrollable crying and intermittent agitation.   -Patient seen by psychiatry, labs ordered with a CK level within normal limits, ammonia level of 15,valproic acid level < 10. -Continue current regimen of Abilify, Depakote.   -Psychiatry was following but signed off on 03/20/2023.   -Will have psych reassess patient as patient not improving in terms of her mental status with episodes of agitation and tearfulness with incomprehensible speech and confusion with difficulty redirecting.   --TSH noted at 5.299.  Vitamin B12 of 1108, folate of 26.1. -Other reversible causes have been ruled out.   -Patient on empiric IV antibiotics, for presumed pneumonia.  3.  ESRD on HD Monday Wednesday Friday/hyperphosphatemia improved with dialysis/elevated anion gap -Patient noted with ongoing issues with dialysis cannulation which has been corrected post ultrasound-guided access of left upper extremity fistula for balloon angioplasty of outflow stenosis at the axillary vein with no residual on completion per IR 03/04/2023.. -Continue sevelamer 1600 mg 3 times daily, avoid nephrotoxic medications, contrast dyes, hypotension and dehydration to ensure adequate renal perfusion.  -Nephrology following and feel  patient needs to be dialyzed in the chair prior to being discharged and needs improved mental status prior to being set up for outpatient dialysis.  -Per nephrology.  4.  Sinus tachycardia -Noted in the setting of GI bleed constipation. -CT abdomen and pelvis with concerns for constipation, rectal wall thickening, perirectal stranding, fecal impaction. -Was on a beta-blocker however beta-blocker discontinued secondary to soft blood pressure.    5.  Constipation and fecal impaction -CT abdomen and pelvis done with large amount of stool in the rectum, wall thickening in the rectum perirectal stranding, findings suggesting fecal impaction and possible proctitis. -No evidence of intestinal obstruction or pneumoperitoneum, no hydronephrosis. -Patient seen in consultation by GI. -Patient received milk of molasses enema with good results on 03/12/2023.   -Patient given another milk and molasses enema and a smog enema on 03/13/2023 with large amount of stool noted with no blood in it.  -Continue current bowel regimen of MiraLAX twice daily, Senokot-S twice daily. -Patient seen by GI and underwent flexible sigmoidoscopy 03/12/2023 which showed large amount of solid stool in the rectum, partially cleared, 15 mm polyp in the rectum biopsied, multiple ulcers in the rectum biopsied. -Post flex sigmoidoscopy GI recommending ongoing bowel regimen and enemas for further stool evacuation and to avoid constipation as this will worsen stercoral ulcers, outpatient follow-up wth [facility] for consideration of EGD/colonoscopy for further evaluation of anemia and removal of colon polyps in the future once patient has recovered from her recent hip fracture.  -  GI following.  6.??  Pneumonia -CT scan done showed small new patchy infiltrates in both lower lower lung fields suggest atelectasis/pneumonia.  Small bilateral pleural effusions more on the left.  Small pericardial effusion, coronary artery disease. -Patient noted  to be drowsy not awake enough to use a flutter valve incentive spirometry on 03/13/2023. -Patient more alert today however confused... -SLP assessed patient placed on a regular diet. -Patient with a leukocytosis white count of 14.9, repeat chest x-ray done 03/13/2023 with lower lung volumes with increasing patchy left basilar airspace disease, suspicious for pneumonia.  Small bilateral effusions. -Leukocytosis trending down. -Due to patient's drowsiness and leukocytosis patient started empirically on IV Zosyn to complete a course of antibiotic treatment.    7.  Leukocytosis in the setting of BRBPR -Initially felt to be reactive but trended up and now in the setting of likely GI bleed and constipation concerns for stercoral colitis. -Repeat chest x-ray done 03/13/2023, with increasing infiltrate concerning for pneumonia. -Blood cultures ordered and negative x 5 days.  -CT abdomen and pelvis done with large amount of stool in the rectum, wall thickening in the rectum and perirectal stranding findings suggestive fecal impaction and possible proctitis.  No evidence of intestinal obstruction or pneumoperitoneum.  No hydronephrosis. -Continue empiric IV Zosyn.    8.  Anemia of chronic kidney disease/postop anemia/BRBPR and concern for GI bleed (Likely stercoral colitis) -Patient with no significant further bleeding. -Patient seen in consultation by GI who recommended adjusting bowel regimen and continue to monitor hemoglobin. -Patient with no known or significant further bloody bowel movements. -Hemoglobin slowly trending down currently at 6.7 this morning. -No overt bleeding. -Patient receiving Darbepoetin alfa 60 mcg q. Mondays. -Patient seen in consultation by GI and patient underwent flexible sigmoidoscopy 02/23/2023 which showed large amount of solid stool in the rectum, partially cleared, 15 mm polyp in the rectum biopsied, multiple ulcers in the rectum biopsied. -Post flex sigmoidoscopy GI  recommending ongoing bowel regimen and enemas for further stool evacuation and to avoid constipation as this will worsen stercoral ulcers, outpatient follow-up with [facility] for consideration of EGD/colonoscopy for further evaluation of anemia and removal of colon polyps in the future once patient has recovered from her recent hip fracture. -Transfused 2 units PRBCs during hemodialysis today 03/18/2023. -Follow H&H, transfusion threshold hemoglobin < 7.   9.  Nonsevere protein calorie malnutrition, moderate -Secondary to chronic illnesses (ESRD, schizoaffective disorder) as evidenced by moderate fat depletion depletion. -Dietitian following, may need to engage other forms of nutrition if patient continues to refuse food and may consider consultation with palliative care.  10.  GERD -PPI.    11.  Hypoalbuminemia - Follow.  12.  Soft blood pressure -Patient noted to be on midodrine 15 mg twice daily prior to admission. -Midodrine resumed and dose uptitrated to 10 mg 3 times daily for better blood pressure control.  -Beta-blocker discontinued. -BP improving.   DVT prophylaxis: SCDs Code Status: Full Family Communication: Updated patient.  No family at bedside. Disposition: Likely needs SNF when medically stable, and able to tolerate outpatient hemodialysis with improvement with mental status..  Status is: Inpatient Remains inpatient appropriate because: Severity of illness   Consultants:  Nephrology: Dr. Ronalee Belts 02/27/2023 Orthopedics: Dr. Carola Frost Mar 08, 2023 Interventional radiology: Dr. Loreta Ave 03/04/2023 Gastroenterology: Dr. Leonides Schanz 03/11/2023 Psychiatry Palliative care: Dr. Patterson Hammersmith 03/16/2023  Procedures:  CT abdomen pelvis 03/12/2023 Chest x-ray 03/14/2023, 03/10/2023, 03/13/2023 Abdominal x-rays 03/13/2023 Ultrasound-guided access left upper extremity fistula for balloon angioplasty of the outflow stenosis at  the axillary vein with no residual on completion Through maintained: Per IR Dr.  Loreta Ave 03/04/2023 Plain films of the left hip Mar 22, 2023 Unipolar hemiarthroplasty on the left hip: Per orthopedics: Dr. Carola Frost March 22, 2023 Flexible sigmoidoscopy: Per Dr. Leonides Schanz 03/06/2023 Transfusion 2 units PRBCs pending.  Antimicrobials:  Anti-infectives (From admission, onward)    Start     Dose/Rate Route Frequency Ordered Stop   03/14/23 1400  piperacillin-tazobactam (ZOSYN) IVPB 2.25 g        2.25 g 100 mL/hr over 30 Minutes Intravenous Every 8 hours 03/14/23 0723     03/13/23 1700  piperacillin-tazobactam (ZOSYN) IVPB 2.25 g  Status:  Discontinued        2.25 g 100 mL/hr over 30 Minutes Intravenous Every 6 hours 03/13/23 1607 03/14/23 0723   03/13/23 1645  piperacillin-tazobactam (ZOSYN) IVPB 3.375 g  Status:  Discontinued        3.375 g 12.5 mL/hr over 240 Minutes Intravenous Every 8 hours 03/13/23 1557 03/13/23 1607   March 22, 2023 1430  ceFAZolin (ANCEF) IVPB 2g/100 mL premix  Status:  Discontinued        2 g 200 mL/hr over 30 Minutes Intravenous Every 6 hours 03-22-23 1334 03/22/2023 1344   03/22/23 1147  vancomycin (VANCOCIN) powder  Status:  Discontinued          As needed March 22, 2023 1147 03-22-2023 1216   2023-03-22 0830  ceFAZolin (ANCEF) IVPB 2g/100 mL premix        2 g 200 mL/hr over 30 Minutes Intravenous On call to O.R. 22-Mar-2023 0731 03-22-2023 1033   2023/03/22 0830  vancomycin (VANCOCIN) IVPB 1000 mg/200 mL premix        1,000 mg 200 mL/hr over 60 Minutes Intravenous On call to O.R. 03/22/23 1610 March 22, 2023 0858         Subjective: In hemodialysis.  Somewhat tearful.  Slightly less confused today however still confused.  Denies any chest pain or shortness of breath.  No abdominal pain.  Currently being transfused 1 unit PRBCs in HD.  Objective: Vitals:   03/18/23 0930 03/18/23 1000 03/18/23 1015 03/18/23 1040  BP: (!) 130/49 118/66 (!) 132/57 135/71  Pulse: 100 (!) 103 (!) 104   Resp: 19 (!) 23 (!) 21 (!) 24  Temp:      TempSrc:      SpO2: 100% 100% 100% 100%  Weight:       Height:        Intake/Output Summary (Last 24 hours) at 03/18/2023 1045 Last data filed at 03/17/2023 2000 Gross per 24 hour  Intake 720 ml  Output 0 ml  Net 720 ml    Filed Weights   03/17/23 0500 03/18/23 0600 03/18/23 0823  Weight: 47 kg 49.3 kg 48.7 kg    Examination:  General exam: In HD.  Confused. Respiratory system: CTAB anterior lung fields.  No wheezes, no crackles, no rhonchi.  Fair air movement.  Speaking in full sentences..  Cardiovascular system: Tachycardia.  No murmurs rubs or gallops.  No JVD.  No lower extremity edema.   Gastrointestinal system: Abdomen is soft, nontender, nondistended, positive bowel sounds.  No rebound.  No guarding. Central nervous system: Alert.  Less tearful.  Confused.  Moving extremities spontaneously.  No focal neurological deficits. Extremities: Symmetric 5 x 5 power. Skin: No rashes, lesions or ulcers Psychiatry: Judgement and insight poor. Mood and affect unable to assess.      Data Reviewed: I have personally reviewed following labs and imaging studies  CBC: Recent Labs  Lab 03/12/23 0150 03/13/23 0127 03/14/23 0045 03/17/2023 1030 03/16/23 0211 03/17/23 0041 03/17/23 1124 03/18/23 0403  WBC 14.3* 14.9*   < > 11.2* 12.0* 13.4* 14.8* 14.2*  NEUTROABS 11.9* 11.9*  --  8.8*  --   --   --   --   HGB 8.2* 7.7*   < > 8.2* 7.2* 7.0* 7.4* 6.7*  HCT 25.9* 23.8*   < > 27.2* 22.5* 22.1* 23.7* 21.0*  MCV 89.3 91.2   < > 93.8 91.1 91.7 92.6 89.0  PLT 297 367   < > 429* 378 430* 434* 433*   < > = values in this interval not displayed.     Basic Metabolic Panel: Recent Labs  Lab 03/12/23 0105 03/13/23 0127 03/14/23 0950 02/24/2023 1007 03/16/23 0211 03/17/23 0041 03/17/23 1124 03/18/23 0403  NA 135 133*   < > 135 134* 135 133* 132*  K 4.5 4.7   < > 4.8 3.5 3.5 3.9 3.8  CL 94* 92*   < > 92* 92* 92* 91* 91*  CO2 24 26   < > 26 28 28 26 25   GLUCOSE 81 98   < > 104* 97 107* 119* 99  BUN 27* 50*   < > 46* 19 29* 33* 43*   CREATININE 3.55* 5.21*   < > 5.23* 3.02* 4.47* 5.08* 6.28*  CALCIUM 10.4* 10.5*   < > 10.4* 9.4 9.9 10.1 9.8  MG 1.7 1.9  --   --   --  1.7  --   --   PHOS 4.4 4.4   < > 4.9* 3.3 4.8* 5.2* 5.2*   < > = values in this interval not displayed.     GFR: Estimated Creatinine Clearance: 6.1 mL/min (A) (by C-G formula based on SCr of 6.28 mg/dL (H)).  Liver Function Tests: Recent Labs  Lab 03/12/23 0105 03/13/23 0127 03/14/23 0950 03/22/2023 1007 03/16/23 0211 03/17/23 0041 03/17/23 1124 03/18/23 0403  AST 19 21  --   --   --   --   --   --   ALT 7 10  --   --   --   --   --   --   ALKPHOS 54 61  --   --   --   --   --   --   BILITOT 0.8 0.5  --   --   --   --   --   --   PROT 7.0 6.7  --   --   --   --   --   --   ALBUMIN 2.8* 2.5*   < > 2.3* 2.0* 2.0* 2.0* 1.9*   < > = values in this interval not displayed.     CBG: No results for input(s): "GLUCAP" in the last 168 hours.   Recent Results (from the past 240 hour(s))  Culture, blood (Routine X 2) w Reflex to ID Panel     Status: None   Collection Time: 03/10/23  9:51 AM   Specimen: BLOOD  Result Value Ref Range Status   Specimen Description BLOOD SITE NOT SPECIFIED  Final   Special Requests   Final    BOTTLES DRAWN AEROBIC AND ANAEROBIC Blood Culture results may not be optimal due to an inadequate volume of blood received in culture bottles   Culture   Final    NO GROWTH 5 DAYS Performed at [facility], [address], [place], [place] [address]    Report  Status 03/23/2023 FINAL  Final  Culture, blood (Routine X 2) w Reflex to ID Panel     Status: None   Collection Time: 03/10/23  9:51 AM   Specimen: BLOOD  Result Value Ref Range Status   Specimen Description BLOOD SITE NOT SPECIFIED  Final   Special Requests   Final    AEROBIC BOTTLE ONLY Blood Culture results may not be optimal due to an inadequate volume of blood received in culture bottles   Culture   Final    NO GROWTH 5 DAYS Performed at [facility], [address] [place], [place] [address]    Report Status 03/21/2023 FINAL  Final         Radiology Studies: No results found.      Scheduled Meds:  sodium chloride   Intravenous Once   acetaminophen  650 mg Oral TID   ARIPiprazole  15 mg Oral Daily   bisacodyl  10 mg Rectal Daily   Chlorhexidine Gluconate Cloth  6 each Topical Q0600   Chlorhexidine Gluconate Cloth  6 each Topical Q0600   [START ON 03/20/2023] darbepoetin (ARANESP) injection - NON-DIALYSIS  100 mcg Subcutaneous Q Wed-1800   docusate  100 mg Oral BID   feeding supplement  1 Container Oral TID BM   midodrine  10 mg Oral TID WC   mirtazapine  15 mg Oral QHS   multivitamin  1 tablet Oral QHS   mouth rinse  15 mL Mouth Rinse 4 times per day   pantoprazole (PROTONIX) IV  40 mg Intravenous Q12H   polyethylene glycol  17 g Oral BID   senna-docusate  1 tablet Oral BID   sevelamer carbonate  1,600 mg Oral TID WC   valproic acid  250 mg Oral TID WC   Continuous Infusions:  anticoagulant sodium citrate     piperacillin-tazobactam (ZOSYN)  IV 2.25 g (03/18/23 0145)     LOS: 20 days    Time spent: 35 minutes    Ramiro Harvest, MD [facility]   To contact the attending provider between 7A-7P or the covering provider during after hours 7P-7A, please log into the web site [email or web address] and access using universal [place] password for that web site. If you do not have the password, please call the hospital operator.  03/18/2023, 10:45 AM

## 2023-03-18 NOTE — Telephone Encounter (Signed)
Patient is returning Madison [name]'s call

## 2023-03-18 NOTE — Progress Notes (Signed)
   03/18/23 1317  Vitals  Temp 98.8 F (37.1 C)  Pulse Rate (!) 109  Resp (!) 21  BP 126/60  SpO2 100 %  Post Treatment  Dialyzer Clearance Clear  Duration of HD Treatment -hour(s) 3.5 hour(s)  Hemodialysis Intake (mL) 0 mL  Liters Processed 74.7  Fluid Removed (mL) 1000 mL  Tolerated HD Treatment Yes  AVG/AVF Arterial Site Held (minutes) 8 minutes  AVG/AVF Venous Site Held (minutes) 8 minutes   Received patient in bed to unit.  Alert and oriented.  Informed consent signed and in chart.   TX duration:3.5hrs  Patient tolerated well.  Transported back to the room  Alert, without acute distress.  Hand-off given to patient's nurse.   Access used: LUA AVF Access issues: none  Total UF removed: 1L Medication(s) given: none    Madison [name]

## 2023-03-18 NOTE — Progress Notes (Signed)
Contacted inpt HD unit this morning to inquire if pt receiving HD in chair today. Staff advised navigator that pt currently receiving HD in bed. Pt must be able to tolerate HD in chair prior to d/c to make sure pt is appropriate for out-pt HD at d/c. Will assist as needed.   Olivia Canter Renal Navigator [phone]

## 2023-03-18 NOTE — Consult Note (Addendum)
Attempted to assess, she has been off the unit most of the morning and into the afternoon (2:45pm) for dialysis. Will attempt to see tomorrow. Patient seen on Friday(6/21), unable to participate in psych evaluation at the time. She presented with rambling and nonsensical speech, which seems to have remained consistent throughout this admission. She was pleasantly confused, not manic or psychotic on Friday. Her sister (legal guardian) states this is not her baseline (per hospitalist), evidence from chart review contradicts this. Per chart review she was alert and oriented to self only in April 2024. Previous ED admissions in 2021-present she was noted to be confused and mumbling. Suspect this is a progression of cognitive impairment (dementia) in the setting of schizophrenia with symptoms consistent of dementia, as she has had several presentations like this over the years.   New psych consult received for Please reassess for intermittent bouts of agitation, tearfulness, confusion, inability to redirect. Need help with managing her mental health medications. Reversible causes were ruled out.   Labs ordered on previous assessment include Ammonia(15), CK(17) and valproic acid.  Valproic Acid level < 10, despite taking Depakote 250 mg po TID with meals x 2 weeks.  Patient with notable lab abnormalities to include Hgb (6.7) B12 (1108), elevated ferritin (4206), creatinine (6.28), TSH(5.299), Albumin (1.9).  Will switch IV Depakote, Depakote is undetectable at this time, despite having low albumin levels. Does appear she has missed several doses or refused. Start IV Depakote 250mg  IV BID. Repeat level on Friday.  -Patient with notable cobalamin, hemoglobin levels and ferritin. Patient continuing to have drops in hemoglobin (10.8> 6.7). GI and Nephrology on board at this time.  -Continue to recommend higher level of care. Patient will benefit from memory care unit.   Psychiatry will continue to follow at this  time for symptom management.   Please keep in mind patient has a legal guardian;deemed incompetent by the state, reach out to legal guardian if she refuses.

## 2023-03-18 NOTE — Telephone Encounter (Signed)
Spoke to patient legal guardian schedule patient for a office visit on 05/24/23 at 11:10 am.

## 2023-03-19 ENCOUNTER — Encounter: Payer: Self-pay | Admitting: Internal Medicine

## 2023-03-19 ENCOUNTER — Encounter (HOSPITAL_COMMUNITY): Payer: Self-pay | Admitting: Internal Medicine

## 2023-03-19 DIAGNOSIS — Z992 Dependence on renal dialysis: Secondary | ICD-10-CM | POA: Diagnosis not present

## 2023-03-19 DIAGNOSIS — N186 End stage renal disease: Secondary | ICD-10-CM | POA: Diagnosis not present

## 2023-03-19 LAB — RENAL FUNCTION PANEL
Albumin: 1.8 g/dL — ABNORMAL LOW (ref 3.5–5.0)
Anion gap: 14 (ref 5–15)
BUN: 17 mg/dL (ref 8–23)
CO2: 26 mmol/L (ref 22–32)
Calcium: 9.6 mg/dL (ref 8.9–10.3)
Chloride: 95 mmol/L — ABNORMAL LOW (ref 98–111)
Creatinine, Ser: 3.41 mg/dL — ABNORMAL HIGH (ref 0.44–1.00)
GFR, Estimated: 14 mL/min — ABNORMAL LOW (ref >60)
Glucose, Bld: 81 mg/dL (ref 70–99)
Phosphorus: 4.3 mg/dL (ref 2.5–4.6)
Potassium: 3.3 mmol/L — ABNORMAL LOW (ref 3.5–5.1)
Sodium: 135 mmol/L (ref 135–145)

## 2023-03-19 LAB — CBC
HCT: 24.1 % — ABNORMAL LOW (ref 36.0–46.0)
Hemoglobin: 8 g/dL — ABNORMAL LOW (ref 12.0–15.0)
MCH: 28.6 pg (ref 26.0–34.0)
MCHC: 33.2 g/dL (ref 30.0–36.0)
MCV: 86.1 fL (ref 80.0–100.0)
Platelets: 391 10*3/uL (ref 150–400)
RBC: 2.8 MIL/uL — ABNORMAL LOW (ref 3.87–5.11)
RDW: 17.7 % — ABNORMAL HIGH (ref 11.5–15.5)
WBC: 12.6 10*3/uL — ABNORMAL HIGH (ref 4.0–10.5)
nRBC: 0 % (ref 0.0–0.2)

## 2023-03-19 LAB — TYPE AND SCREEN: Antibody Screen: NEGATIVE

## 2023-03-19 LAB — BPAM RBC
Blood Product Expiration Date: 202407102359
ISSUE DATE / TIME: 202406241012

## 2023-03-19 MED ORDER — BENZTROPINE MESYLATE 0.5 MG PO TABS
0.2500 mg | ORAL_TABLET | Freq: Two times a day (BID) | ORAL | Status: DC
  Administered 2023-03-19 – 2023-03-23 (×8): 0.25 mg via ORAL
  Filled 2023-03-19 (×15): qty 1

## 2023-03-19 NOTE — Progress Notes (Signed)
Tolerating recliner well. Smiling and responsive, doesn't appear to be in pain at this time. VSS. OT to assist with returning to bed.

## 2023-03-19 NOTE — Progress Notes (Signed)
Physical Therapy Treatment Patient Details Name: Madison [name] MRN: [id number] DOB: 03/23/1951 Today's Date: 03/19/2023   History of Present Illness 72 y.o. female admitted 6/4 for fall and hip pain, s/p UNIPOLAR HEMIARTHROPLASTY OF THE LEFT HIP on 6/6. PMHx: history of ESRD, HTN, and schizophrenia versus bipolar disorder.    PT Comments    Pt supine in bed resting.  Attempted tx at 12:54 but she was eating lunch with family member at bedside.  Pt continues to improve and BP did not drop moving to sitting but did drop post tx.  RN informed.  RN to order B prevalon boot to assist with ER of L hip.  She continues to required max-total+2 assistance to mobilize.  Will continue to recommend rehab in a post acute setting.      Recommendations for follow up therapy are one component of a multi-disciplinary discharge planning process, led by the attending physician.  Recommendations may be updated based on patient status, additional functional criteria and insurance authorization.  Follow Up Recommendations  Can patient physically be transported by private vehicle: No    Assistance Recommended at Discharge Intermittent Supervision/Assistance  Patient can return home with the following Two people to help with walking and/or transfers;Two people to help with bathing/dressing/bathroom;Assistance with cooking/housework;Assist for transportation;Help with stairs or ramp for entrance   Equipment Recommendations  None recommended by PT    Recommendations for Other Services       Precautions / Restrictions Precautions Precautions: Posterior Hip Precaution Booklet Issued: Yes (comment) Precaution Comments: Unable to understand due to poor cognition Required Braces or Orthoses: Other Brace Knee Immobilizer - Left: Other (comment) (hip abduction wedge as she lacks ability to maintain posterior hip precautions.) Other Brace: wedge cushion Restrictions Weight Bearing Restrictions: Yes LLE Weight  Bearing: Weight bearing as tolerated     Mobility  Bed Mobility Overal bed mobility: Needs Assistance Bed Mobility: Rolling     Supine to sit: Max assist, HOB elevated     General bed mobility comments: PTA performed assistance to edge of bed with tactile guidance for B LEs and assistance to elevate trunk into sitting and maintain weight shifting to the L.  Pt required assistance for head control and posture.  BP lying 109/63, in sitting 129/59,  total assistance to bring hands to steady cross bar but able to grip once hand is on the bar.    Transfers Overall transfer level: Needs assistance Equipment used: Ambulation equipment used (sara stedy) Transfers: Sit to/from Stand Sit to Stand: Total assist, +2 physical assistance           General transfer comment: Total +2 x 3 reps to rise into standing against gravity. Pt is very anxious and required assistance to rise into standing.  Presents with bowel incontinence which prompted for increased standing trials,  HR and RR elevated with activity.  Pt with shaking due to fear and anxiety.  Once recliner in chair she was resting comfortably.    Ambulation/Gait                   Stairs             Wheelchair Mobility    Modified Rankin (Stroke Patients Only)       Balance Overall balance assessment: Needs assistance Sitting-balance support: Feet supported Sitting balance-Leahy Scale: Poor Sitting balance - Comments: external support +1 for upright sitting edge of bed, B hand hold on cross bar of steady.     Standing balance-Leahy  Scale: Poor                              Cognition Arousal/Alertness: Awake/alert Behavior During Therapy: Anxious, Flat affect Overall Cognitive Status: History of cognitive impairments - at baseline                                 General Comments: Pt alert throughout and very anxious with progression of mobility.        Exercises General  Exercises - Lower Extremity Long Arc Quad: PROM, AAROM, Both, 5 reps    General Comments        Pertinent Vitals/Pain Pain Assessment Pain Assessment: Faces Faces Pain Scale: Hurts whole lot Pain Location: L hip with movements (pt did not localize but guarding in this area) Pain Descriptors / Indicators: Grimacing, Guarding, Moaning Pain Intervention(s): Monitored during session, Repositioned    Home Living                          Prior Function            PT Goals (current goals can now be found in the care plan section) Acute Rehab PT Goals Patient Stated Goal: none stated Potential to Achieve Goals: Fair Progress towards PT goals: Progressing toward goals    Frequency    Min 2X/week      PT Plan Current plan remains appropriate    Co-evaluation              AM-PAC PT "6 Clicks" Mobility   Outcome Measure  Help needed turning from your back to your side while in a flat bed without using bedrails?: Total Help needed moving from lying on your back to sitting on the side of a flat bed without using bedrails?: Total Help needed moving to and from a bed to a chair (including a wheelchair)?: Total Help needed standing up from a chair using your arms (e.g., wheelchair or bedside chair)?: Total Help needed to walk in hospital room?: Total Help needed climbing 3-5 steps with a railing? : Total 6 Click Score: 6    End of Session   Activity Tolerance: Patient limited by pain Patient left: with nursing/sitter in room;in chair;with chair alarm set Nurse Communication: Mobility status PT Visit Diagnosis: Muscle weakness (generalized) (M62.81);History of falling (Z91.81);Difficulty in walking, not elsewhere classified (R26.2);Pain Pain - Right/Left: Left Pain - part of body: Hip;Leg     Time: 5409-8119 PT Time Calculation (min) (ACUTE ONLY): 29 min  Charges:  $Therapeutic Activity: 23-37 mins                     Bonney Leitz , PTA Acute  Rehabilitation Services Office [phone]    Madison [name] Artis Delay 03/19/2023, 3:24 PM

## 2023-03-19 NOTE — Progress Notes (Signed)
[facility] Progress Note    Assessment/ Plan:   s/p hemiarthroplasty for left hip fracture: plan for SNF, rehab.    ESRD: On HD per MWF schedule (previously [facility] in [place]). -HD on MWF schedule, will try for HD in chair tomorrow   Anemia CKD: Very high ferritin level.  Continue Aranesp and monitor hemoglobin.  Transfuse as needed for hgb <7. Hgb 8, received 1u prbc yesterday   CKD-MBD: hyperphosphatemia - on sevelamer.  Will need a reduced ca bath for now if able (unfortunately we are limited inpatient).  Phosphorus level stable 4.3   Hypertension: Blood pressure low, on midodrine 10mg  TID. Uf'ing as tolerated with HD   Bright red blood per rectum, proctitis, fecal impaction - per primary service. S/p flex sig 6/21-multiple rectal ulcers, large amount of solid stool in rectum   # Disposition - she is changing SNF's and will need a new outpatient dialysis unit.  (She is not able to be discharged without a new outpatient HD unit confirmed).  Note that at this time she is not acceptable for outpatient HD. She needs to be able to communicate her needs in the outpatient dialysis setting and able to sit on recliner or chair for HD. Of note, apparently sister sits with patient throughout all dialysis treatments chronically. Confirmed with sister at bedside on 6/25  Subjective:   Patient seen and examined bedside. No complaints/acute events. Tolerated HD yesterday with net uf 1L   Objective:   BP 113/62 (BP Location: Right Arm)   Pulse 99   Temp 99 F (37.2 C) (Oral)   Resp 16   Ht 5\' 4"  (1.626 m)   Wt 50.6 kg   SpO2 98%   BMI 19.15 kg/m   Intake/Output Summary (Last 24 hours) at 03/19/2023 1059 Last data filed at 03/19/2023 6387 Gross per 24 hour  Intake 758.33 ml  Output 1000 ml  Net -241.67 ml   Weight change: -0.6 kg  Physical Exam: Gen: chronically ill appearing, sitting up in bed CVS: RRR Resp: cta b/l, normal WOB Abd: soft,  nt/nd Ext: no edema Neuro: awake Dialysis access: LUE AVF + b/t  Imaging: No results found.  Labs: BMET Recent Labs  Lab 03/14/23 0950 03/10/2023 1007 03/16/23 0211 03/17/23 0041 03/17/23 1124 03/18/23 0403 03/19/23 0054  NA 138 135 134* 135 133* 132* 135  K 4.1 4.8 3.5 3.5 3.9 3.8 3.3*  CL 91* 92* 92* 92* 91* 91* 95*  CO2 25 26 28 28 26 25 26   GLUCOSE 123* 104* 97 107* 119* 99 81  BUN 29* 46* 19 29* 33* 43* 17  CREATININE 3.77* 5.23* 3.02* 4.47* 5.08* 6.28* 3.41*  CALCIUM 10.4* 10.4* 9.4 9.9 10.1 9.8 9.6  PHOS 4.2 4.9* 3.3 4.8* 5.2* 5.2* 4.3   CBC Recent Labs  Lab 03/13/23 0127 03/14/23 0045 03/12/2023 1030 03/16/23 0211 03/17/23 0041 03/17/23 1124 03/18/23 0403 03/18/23 1925 03/19/23 0054  WBC 14.9*   < > 11.2*   < > 13.4* 14.8* 14.2*  --  12.6*  NEUTROABS 11.9*  --  8.8*  --   --   --   --   --   --   HGB 7.7*   < > 8.2*   < > 7.0* 7.4* 6.7* 8.5* 8.0*  HCT 23.8*   < > 27.2*   < > 22.1* 23.7* 21.0* 25.5* 24.1*  MCV 91.2   < > 93.8   < > 91.7 92.6 89.0  --  86.1  PLT 367   < > 429*   < > 430* 434* 433*  --  391   < > = values in this interval not displayed.    Medications:     sodium chloride   Intravenous Once   acetaminophen  650 mg Oral TID   ARIPiprazole  15 mg Oral Daily   bisacodyl  10 mg Rectal Daily   Chlorhexidine Gluconate Cloth  6 each Topical Q0600   Chlorhexidine Gluconate Cloth  6 each Topical Q0600   [START ON 03/20/2023] darbepoetin (ARANESP) injection - NON-DIALYSIS  100 mcg Subcutaneous Q Wed-1800   docusate  100 mg Oral BID   feeding supplement  1 Container Oral TID BM   midodrine  10 mg Oral TID WC   mirtazapine  15 mg Oral QHS   multivitamin  1 tablet Oral QHS   mouth rinse  15 mL Mouth Rinse 4 times per day   pantoprazole (PROTONIX) IV  40 mg Intravenous Q12H   polyethylene glycol  17 g Oral BID   senna-docusate  1 tablet Oral BID   sevelamer carbonate  1,600 mg Oral TID WC      Anthony Sar, MD [facility] 03/19/2023, 10:59 AM

## 2023-03-19 NOTE — Progress Notes (Signed)
PROGRESS NOTE    Madison [name]  ONG:[id number] DOB: Nov 02, 1950 DOA: 02/27/2023 PCP: Charlynne Pander, MD    No chief complaint on file.   Brief Narrative:  The patient is a 72 year old SNF resident with past medical history significant for but limited to ESRD on hemodialysis Monday Wednesday Friday, hypertension, history of schizophrenia versus bipolar disorder as well as other comorbidities who presented to the [facility] ER after a fall which occurred multiple days prior.  X-rays at [facility] ER revealed a left femoral fracture and she was transferred to [facility] given that she needed a combination of the orthopedist as well as inpatient dialysis.  She underwent unipolar Hemi arthroplasty of the left hip on 03/07/2023 and has been deemed stable from a orthopedic standpoint.  She had issues with her dialysis catheter which has not been fixed and she is getting dialysis.  She was extremely agitated and hysterically crying early on during the hospitalization and remained emotionally labile and tearful however was calmer.   We were anticipating discharging back to SNF with resumption of her normal dialysis session on Friday, 03/08/2023 however a barrier to discharge was that the patient will need to be able to tolerate sitting in a chair for 4 to 5 hours to be appropriate for outpatient HD for discharge.  She was able to dialyze in the chair early on during the hospitalization, appropriately and had no issues with that but heart rate remained elevated in the setting of pain and agitation so she was initiated on a beta-blocker and given some pain medication.    Heart rate was improved however she now has a slight leukocytosis and after repeat is trending down.  TOC attempted to call the facility and got an response so unfortunately could not discharge given that no one at the bedside picked up.  Case worker is informed that the patient's legal guardian was unhappy with the SNF choice and requested to change her  SNF placement to [facility].  [facility] is able to take the patient at their facility  however because of the symptoms changed her dialysis center will need to be moved.     CBC continue to further worsen as she had bright red blood per rectum and was found to be impacted.  Having large stool ball and her bowel regimen has been escalated and obtained CT scan.  GI was consulted given her hemoglobin and bleeding and has bleeding noted to be subsiding, GI not recommending endoscopic procedures at this time.  GI feels patient bleeding likely secondary to inflammation in the rectum due to stercoral colitis, and the meds recommended for patient's constipation a few no further additional bleeding at this time..  Will need to continue monitor carefully.     Assessment & Plan:   Principal Problem:   ESRD on hemodialysis (HCC) Active Problems:   Schizoaffective disorder, bipolar type (HCC)   Hip fracture, left (HCC)   Malnutrition of moderate degree   Hematochezia   Constipation   Gastroesophageal reflux disease   BRBPR (bright red blood per rectum)   Leukocytosis   Sinus tachycardia   Anemia due to chronic kidney disease, on chronic dialysis (HCC)  #1 left femoral neck fracture -Secondary to mechanical fall. -Patient seen in consultation by orthopedics underwent unipolar hemiarthroplasty of the left hip 03/20/2023 without any significant complications. -Continue current pain regimen of oxycodone 5 to 10 mg every 4 hours as needed moderate pain, acetaminophen 650 mg 3 times daily, fentanyl 12.5 mg  every 2 hours as needed for severe pain. -Orthopedics recommending WBAT left lower extremity with assistance and posterior hip precautions for 12 weeks with daily wound care as needed and DVT prophylaxis with subcu heparin x 28 days. -Continue current bowel regimen. -Outpatient follow-up with orthopedic surgery 10 days postdischarge.  2.  Schizoaffective disorder/bipolar type with uncontrollable  crying and agitation intermittently/acute metabolic encephalopathy -Continue home regimen Abilify, Depakote, Cogentin.  -Patient noted to have been extremely agitated and anxious a few days ago however noted to be drowsy on 03/13/2023, alert 03/14/2023 however tearful and noted to have intermittent agitation and confusion.   -Patient also with some incomprehensible speech.  Patient difficult to redirect. -RN concerned about patient's inability to swallow, SLP evaluated patient patient cleared for regular diet. -Continue mirtazapine 15 mg nightly. -Haldol as needed. -Continue treatment for probable pneumonia.   -Psychiatry consulted for further management of schizoaffective disorder/bipolar disorder with bouts of uncontrollable crying and intermittent agitation.   -Patient seen by psychiatry, labs ordered with a CK level within normal limits, ammonia level of 15,valproic acid level < 10. -Continue current regimen of Abilify, Depakote.   -Psychiatry was following but signed off on 03/14/2023.   -Psych reassessed the patient as patient was not improving in terms of her mental status with episodes of agitation and tearfulness with incomprehensible speech and confusion with difficulty redirecting.   -Patient started back on half home regimen of Cogentin today 03/19/2023 due to increased tone and tremor. -Valproic acid changed from oral to IV per psychiatry as patient noted to be frequently refusing oral medications. --TSH noted at 5.299.  Vitamin B12 of 1108, folate of 26.1. -Other reversible causes have been ruled out.   -Patient on empiric IV antibiotics, for presumed pneumonia.  3.  ESRD on HD Monday Wednesday Friday/hyperphosphatemia improved with dialysis/elevated anion gap -Patient noted with ongoing issues with dialysis cannulation which has been corrected post ultrasound-guided access of left upper extremity fistula for balloon angioplasty of outflow stenosis at the axillary vein with no residual  on completion per IR 03/04/2023.. -Continue sevelamer 1600 mg 3 times daily, avoid nephrotoxic medications, contrast dyes, hypotension and dehydration to ensure adequate renal perfusion.  -Nephrology following and feel patient needs to be dialyzed in the chair prior to being discharged and needs improved mental status prior to being set up for outpatient dialysis so she is able to communicate his needs during hemodialysis.  -Per nephrology.  4.  Sinus tachycardia -Noted in the setting of GI bleed constipation. -CT abdomen and pelvis with concerns for constipation, rectal wall thickening, perirectal stranding, fecal impaction. -Was on a beta-blocker however beta-blocker discontinued secondary to soft blood pressure.    5.  Constipation and fecal impaction -CT abdomen and pelvis done with large amount of stool in the rectum, wall thickening in the rectum perirectal stranding, findings suggesting fecal impaction and possible proctitis. -No evidence of intestinal obstruction or pneumoperitoneum, no hydronephrosis. -Patient seen in consultation by GI. -Patient received milk of molasses enema with good results on 03/12/2023.   -Patient given another milk and molasses enema and a smog enema on 03/13/2023 with large amount of stool noted with no blood in it.  -Continue current bowel regimen of MiraLAX twice daily, Senokot-S twice daily. -Patient seen by GI and underwent flexible sigmoidoscopy 03/04/2023 which showed large amount of solid stool in the rectum, partially cleared, 15 mm polyp in the rectum biopsied, multiple ulcers in the rectum biopsied. -Post flex sigmoidoscopy GI recommending ongoing bowel regimen and enemas  for further stool evacuation and to avoid constipation as this will worsen stercoral ulcers, outpatient follow-up wth [facility] for consideration of EGD/colonoscopy for further evaluation of anemia and removal of colon polyps in the future once patient has recovered from her recent hip  fracture.  -GI following.  6.??  Pneumonia -CT scan done showed small new patchy infiltrates in both lower lower lung fields suggest atelectasis/pneumonia.  Small bilateral pleural effusions more on the left.  Small pericardial effusion, coronary artery disease. -Patient noted to be drowsy not awake enough to use a flutter valve incentive spirometry on 03/13/2023. -Patient more alert today however confused... -SLP assessed patient placed on a regular diet. -Patient with a leukocytosis white count of 14.9, repeat chest x-ray done 03/13/2023 with lower lung volumes with increasing patchy left basilar airspace disease, suspicious for pneumonia.  Small bilateral effusions. -Leukocytosis trending down. -Due to patient's drowsiness and leukocytosis patient started empirically on IV Zosyn to complete a course of antibiotic treatment for probable pneumonia.    7.  Leukocytosis in the setting of BRBPR -Initially felt to be reactive but trended up and now in the setting of likely GI bleed and constipation concerns for stercoral colitis. -Repeat chest x-ray done 03/13/2023, with increasing infiltrate concerning for pneumonia. -Blood cultures ordered and negative x 5 days.  -CT abdomen and pelvis done with large amount of stool in the rectum, wall thickening in the rectum and perirectal stranding findings suggestive fecal impaction and possible proctitis.  No evidence of intestinal obstruction or pneumoperitoneum.  No hydronephrosis. -Continue empiric IV Zosyn to complete a course of antibiotic treatment..    8.  Anemia of chronic kidney disease/postop anemia/BRBPR and concern for GI bleed (Likely stercoral colitis) -Patient with no significant further bleeding. -Patient seen in consultation by GI who recommended adjusting bowel regimen and continue to monitor hemoglobin. -Patient with no known or significant further bloody bowel movements. -Hemoglobin slowly trending down currently at 6.7 this morning. -No  overt bleeding. -Patient receiving Darbepoetin alfa 60 mcg q. Mondays. -Patient seen in consultation by GI and patient underwent flexible sigmoidoscopy Mar 31, 2023 which showed large amount of solid stool in the rectum, partially cleared, 15 mm polyp in the rectum biopsied, multiple ulcers in the rectum biopsied. -Post flex sigmoidoscopy GI recommending ongoing bowel regimen and enemas for further stool evacuation and to avoid constipation as this will worsen stercoral ulcers, outpatient follow-up with [facility] for consideration of EGD/colonoscopy for further evaluation of anemia and removal of colon polyps in the future once patient has recovered from her recent hip fracture. -Transfused 2 units PRBCs during hemodialysis today 03/18/2023. -Follow H&H, transfusion threshold hemoglobin < 7.   9.  Nonsevere protein calorie malnutrition, moderate -Secondary to chronic illnesses (ESRD, schizoaffective disorder) as evidenced by moderate fat depletion depletion. -Dietitian following, may need to engage other forms of nutrition if patient continues to refuse food. -Palliative care consulted and following.   10.  GERD -Continue PPI.   11.  Hypoalbuminemia - Follow.  12.  Soft blood pressure -Patient noted to be on midodrine 15 mg twice daily prior to admission. -Midodrine resumed and dose uptitrated to 10 mg 3 times daily for better blood pressure control.  -Beta-blocker discontinued. -BP improving.   DVT prophylaxis: SCDs Code Status: Full Family Communication: Updated patient.  Updated Sister Meriam Sprague, [place] at bedside.   Disposition: Likely needs SNF when medically stable, and able to tolerate outpatient hemodialysis with improvement with mental status where she is able to express her needs  and hemodialysis per nephrology..  Status is: Inpatient Remains inpatient appropriate because: Severity of illness   Consultants:  Nephrology: Dr. Ronalee Belts 02/27/2023 Orthopedics: Dr. Carola Frost  03/08/2023 Interventional radiology: Dr. Loreta Ave 03/04/2023 Gastroenterology: Dr. Leonides Schanz 03/11/2023 Psychiatry Palliative care: Dr. Patterson Hammersmith 03/16/2023  Procedures:  CT abdomen pelvis 03/12/2023 Chest x-ray Mar 26, 2023, 03/10/2023, 03/13/2023 Abdominal x-rays 03/13/2023 Ultrasound-guided access left upper extremity fistula for balloon angioplasty of the outflow stenosis at the axillary vein with no residual on completion Through maintained: Per IR Dr. Loreta Ave 03/04/2023 Plain films of the left hip 03/02/2023 Unipolar hemiarthroplasty on the left hip: Per orthopedics: Dr. Carola Frost 03/06/2023 Flexible sigmoidoscopy: Per Dr. Leonides Schanz 03/06/2023 Transfusion 1 unit PRBC 03/18/2023  Antimicrobials:  Anti-infectives (From admission, onward)    Start     Dose/Rate Route Frequency Ordered Stop   03/14/23 1400  piperacillin-tazobactam (ZOSYN) IVPB 2.25 g        2.25 g 100 mL/hr over 30 Minutes Intravenous Every 8 hours 03/14/23 0723     03/13/23 1700  piperacillin-tazobactam (ZOSYN) IVPB 2.25 g  Status:  Discontinued        2.25 g 100 mL/hr over 30 Minutes Intravenous Every 6 hours 03/13/23 1607 03/14/23 0723   03/13/23 1645  piperacillin-tazobactam (ZOSYN) IVPB 3.375 g  Status:  Discontinued        3.375 g 12.5 mL/hr over 240 Minutes Intravenous Every 8 hours 03/13/23 1557 03/13/23 1607   03/06/2023 1430  ceFAZolin (ANCEF) IVPB 2g/100 mL premix  Status:  Discontinued        2 g 200 mL/hr over 30 Minutes Intravenous Every 6 hours 03/16/2023 1334 03/05/2023 1344   03/10/2023 1147  vancomycin (VANCOCIN) powder  Status:  Discontinued          As needed 03/07/2023 1147 03/01/2023 1216   03/03/2023 0830  ceFAZolin (ANCEF) IVPB 2g/100 mL premix        2 g 200 mL/hr over 30 Minutes Intravenous On call to O.R. 03/16/2023 0731 03/13/2023 1033   03/10/2023 0830  vancomycin (VANCOCIN) IVPB 1000 mg/200 mL premix        1,000 mg 200 mL/hr over 60 Minutes Intravenous On call to O.R. 03/04/2023 1610 03/21/2023 0858         Subjective: Patient  alert today.  Less confused today.  Denies any chest pain or shortness of breath.  No abdominal pain.  Sister at bedside who states this is not her baseline, sister states patient is able to communicate to her her needs although she has some bouts of intermittent confusion  Objective: Vitals:   03/18/23 2026 03/19/23 0438 03/19/23 0741 03/19/23 0753  BP: 104/60 110/67  113/62  Pulse: 99 98  99  Resp: 20 13  16   Temp: 99 F (37.2 C) 98.8 F (37.1 C)  99 F (37.2 C)  TempSrc: Oral Oral  Oral  SpO2: 98% 99%  98%  Weight:   50.6 kg   Height:        Intake/Output Summary (Last 24 hours) at 03/19/2023 1233 Last data filed at 03/19/2023 9604 Gross per 24 hour  Intake 220 ml  Output 1000 ml  Net -780 ml    Filed Weights   03/18/23 0600 03/18/23 0823 03/19/23 0741  Weight: 49.3 kg 48.7 kg 50.6 kg    Examination:  General exam: More alert.  Some confusion.  Some facial twitching and some tremors noted. Respiratory system: Lungs clear to auscultation bilaterally anterior lung fields.  No wheezes, no crackles, no rhonchi.  Fair air movement.  Speaking in full sentences.  Cardiovascular system: RRR no murmurs rubs or gallops.  No JVD.  No lower extremity edema.  Gastrointestinal system: Abdomen is soft, nontender, nondistended, positive bowel sounds.  No rebound.  No guarding.  Central nervous system: Alert.  Less tearful.  Confused.  Moving extremities spontaneously.  No focal neurological deficits. Extremities: Symmetric 5 x 5 power. Skin: No rashes, lesions or ulcers Psychiatry: Judgement and insight poor. Mood and affect unable to assess.      Data Reviewed: I have personally reviewed following labs and imaging studies  CBC: Recent Labs  Lab 03/13/23 0127 03/14/23 0045 03/07/2023 1030 03/16/23 0211 03/17/23 0041 03/17/23 1124 03/18/23 0403 03/18/23 1925 03/19/23 0054  WBC 14.9*   < > 11.2* 12.0* 13.4* 14.8* 14.2*  --  12.6*  NEUTROABS 11.9*  --  8.8*  --   --   --   --    --   --   HGB 7.7*   < > 8.2* 7.2* 7.0* 7.4* 6.7* 8.5* 8.0*  HCT 23.8*   < > 27.2* 22.5* 22.1* 23.7* 21.0* 25.5* 24.1*  MCV 91.2   < > 93.8 91.1 91.7 92.6 89.0  --  86.1  PLT 367   < > 429* 378 430* 434* 433*  --  391   < > = values in this interval not displayed.     Basic Metabolic Panel: Recent Labs  Lab 03/13/23 0127 03/14/23 0950 03/16/23 0211 03/17/23 0041 03/17/23 1124 03/18/23 0403 03/19/23 0054  NA 133*   < > 134* 135 133* 132* 135  K 4.7   < > 3.5 3.5 3.9 3.8 3.3*  CL 92*   < > 92* 92* 91* 91* 95*  CO2 26   < > 28 28 26 25 26   GLUCOSE 98   < > 97 107* 119* 99 81  BUN 50*   < > 19 29* 33* 43* 17  CREATININE 5.21*   < > 3.02* 4.47* 5.08* 6.28* 3.41*  CALCIUM 10.5*   < > 9.4 9.9 10.1 9.8 9.6  MG 1.9  --   --  1.7  --   --   --   PHOS 4.4   < > 3.3 4.8* 5.2* 5.2* 4.3   < > = values in this interval not displayed.     GFR: Estimated Creatinine Clearance: 11.7 mL/min (A) (by C-G formula based on SCr of 3.41 mg/dL (H)).  Liver Function Tests: Recent Labs  Lab 03/13/23 0127 03/14/23 0950 03/16/23 0211 03/17/23 0041 03/17/23 1124 03/18/23 0403 03/19/23 0054  AST 21  --   --   --   --   --   --   ALT 10  --   --   --   --   --   --   ALKPHOS 61  --   --   --   --   --   --   BILITOT 0.5  --   --   --   --   --   --   PROT 6.7  --   --   --   --   --   --   ALBUMIN 2.5*   < > 2.0* 2.0* 2.0* 1.9* 1.8*   < > = values in this interval not displayed.     CBG: No results for input(s): "GLUCAP" in the last 168 hours.   Recent Results (from the past 240 hour(s))  Culture, blood (Routine X 2) w Reflex to  ID Panel     Status: None   Collection Time: 03/10/23  9:51 AM   Specimen: BLOOD  Result Value Ref Range Status   Specimen Description BLOOD SITE NOT SPECIFIED  Final   Special Requests   Final    BOTTLES DRAWN AEROBIC AND ANAEROBIC Blood Culture results may not be optimal due to an inadequate volume of blood received in culture bottles   Culture   Final     NO GROWTH 5 DAYS Performed at [facility], [address] [place], [place] [address]    Report Status 03/24/2023 FINAL  Final  Culture, blood (Routine X 2) w Reflex to ID Panel     Status: None   Collection Time: 03/10/23  9:51 AM   Specimen: BLOOD  Result Value Ref Range Status   Specimen Description BLOOD SITE NOT SPECIFIED  Final   Special Requests   Final    AEROBIC BOTTLE ONLY Blood Culture results may not be optimal due to an inadequate volume of blood received in culture bottles   Culture   Final    NO GROWTH 5 DAYS Performed at [facility], [address] [place], [place] [address]    Report Status 03/14/2023 FINAL  Final         Radiology Studies: No results found.      Scheduled Meds:  sodium chloride   Intravenous Once   acetaminophen  650 mg Oral TID   ARIPiprazole  15 mg Oral Daily   bisacodyl  10 mg Rectal Daily   Chlorhexidine Gluconate Cloth  6 each Topical Q0600   Chlorhexidine Gluconate Cloth  6 each Topical Q0600   [START ON 03/20/2023] darbepoetin (ARANESP) injection - NON-DIALYSIS  100 mcg Subcutaneous Q Wed-1800   docusate  100 mg Oral BID   feeding supplement  1 Container Oral TID BM   midodrine  10 mg Oral TID WC   mirtazapine  15 mg Oral QHS   multivitamin  1 tablet Oral QHS   mouth rinse  15 mL Mouth Rinse 4 times per day   pantoprazole (PROTONIX) IV  40 mg Intravenous Q12H   polyethylene glycol  17 g Oral BID   senna-docusate  1 tablet Oral BID   sevelamer carbonate  1,600 mg Oral TID WC   Continuous Infusions:  piperacillin-tazobactam (ZOSYN)  IV 2.25 g (03/19/23 0651)   valproate sodium       LOS: 21 days    Time spent: 35 minutes    Ramiro Harvest, MD [facility]   To contact the attending provider between 7A-7P or the covering provider during after hours 7P-7A, please log into the web site [email or web address] and access using [employer] password for that web site. If you do not have the  password, please call the hospital operator.  03/19/2023, 12:33 PM

## 2023-03-19 NOTE — Progress Notes (Signed)
Occupational Therapy Treatment Patient Details Name: Madison [name] MRN: [id number] DOB: 28-Feb-1951 Today's Date: 03/19/2023   History of present illness 72 y.o. female admitted 6/4 for fall and hip pain, s/p UNIPOLAR HEMIARTHROPLASTY OF THE LEFT HIP on 6/6. PMHx: history of ESRD, HTN, and schizophrenia versus bipolar disorder.   OT comments  Upon OT entry, pt initially alert and following conversation very well while being up in recliner. Following mobility of LLE pt became more anxious and fearful, needing total A for STS. Attempted to reinforce precautions but pt became very selective with attention and command following after being anxious, she was unable to verbally recall any precautions despite direct cueing. Pt not even responding to orientation questions but selectively responding to other questions, abduction wedge placed with explanation of precautions given but pt likely needing reinforcement. OT to continue to progress pt as able. DC plans remain appropriate for SNF.    Recommendations for follow up therapy are one component of a multi-disciplinary discharge planning process, led by the attending physician.  Recommendations may be updated based on patient status, additional functional criteria and insurance authorization.    Assistance Recommended at Discharge Frequent or constant Supervision/Assistance  Patient can return home with the following  Two people to help with walking and/or transfers;Direct supervision/assist for financial management;Two people to help with bathing/dressing/bathroom;Direct supervision/assist for medications management;Assistance with cooking/housework;Assist for transportation;Help with stairs or ramp for entrance;Assistance with feeding   Equipment Recommendations  Other (comment) (defer)    Recommendations for Other Services      Precautions / Restrictions Precautions Precautions: Posterior Hip Precaution Booklet Issued: Yes (comment) Precaution  Comments: Unable to understand due to poor cognition Required Braces or Orthoses: Other Brace Knee Immobilizer - Left: Other (comment) Other Brace: wedge cushion Restrictions Weight Bearing Restrictions: Yes LLE Weight Bearing: Weight bearing as tolerated       Mobility Bed Mobility Overal bed mobility: Needs Assistance Bed Mobility: Sit to Supine     Supine to sit: Max assist, HOB elevated          Transfers Overall transfer level: Needs assistance Equipment used: Ambulation equipment used (sara stedy) Transfers: Bed to chair/wheelchair/BSC, Sit to/from Stand Sit to Stand: Total assist, +2 physical assistance           General transfer comment: STS x2 with stedy, pillow positioned in between BLEs to promote abduction Transfer via Lift Equipment: Stedy   Balance Overall balance assessment: Needs assistance Sitting-balance support: Feet supported Sitting balance-Leahy Scale: Poor     Standing balance support: Reliant on assistive device for balance Standing balance-Leahy Scale: Zero Standing balance comment: Stedy                           ADL either performed or assessed with clinical judgement   ADL                                         General ADL Comments: Attempted to reinforce precautions, once becoming anxiouos pt attention became more selective and was not following commands/prompts as much.    Extremity/Trunk Assessment              Vision       Perception     Praxis      Cognition Arousal/Alertness: Awake/alert Behavior During Therapy: Anxious, Flat affect Overall Cognitive Status: History of cognitive impairments -  at baseline                                 General Comments: Pt initially very alert and engage when sitting up in recliner upon entry, following movement of LLE pt became very shaky and anxious        Exercises      Shoulder Instructions       General Comments Bp  113/58 taken when returned to supine in bed    Pertinent Vitals/ Pain       Pain Assessment Pain Assessment: Faces Faces Pain Scale: Hurts whole lot Pain Location: L hip with movements (pt did not localize but guarding in this area) Pain Descriptors / Indicators: Grimacing, Guarding, Moaning Pain Intervention(s): Monitored during session, Repositioned  Home Living                                          Prior Functioning/Environment              Frequency  Min 1X/week        Progress Toward Goals  OT Goals(current goals can now be found in the care plan section)  Progress towards OT goals: Not progressing toward goals - comment (pt limited by cognition and anxiety)  Acute Rehab OT Goals OT Goal Formulation: Patient unable to participate in goal setting Time For Goal Achievement: 04/02/23 Potential to Achieve Goals: Fair  Plan Discharge plan remains appropriate;Frequency remains appropriate    Co-evaluation                 AM-PAC OT "6 Clicks" Daily Activity     Outcome Measure   Help from another person eating meals?: A Lot Help from another person taking care of personal grooming?: A Lot Help from another person toileting, which includes using toliet, bedpan, or urinal?: A Lot Help from another person bathing (including washing, rinsing, drying)?: A Lot Help from another person to put on and taking off regular upper body clothing?: A Lot Help from another person to put on and taking off regular lower body clothing?: Total 6 Click Score: 11    End of Session    OT Visit Diagnosis: Muscle weakness (generalized) (M62.81);Other symptoms and signs involving cognitive function;Pain Pain - Right/Left: Left Pain - part of body: Hip   Activity Tolerance Patient limited by pain;Other (comment) (limited by cognition)   Patient Left in bed;with call bell/phone within reach;with bed alarm set   Nurse Communication Mobility status         Time: 1731-1751 OT Time Calculation (min): 20 min  Charges: OT General Charges $OT Visit: 1 Visit OT Treatments $Therapeutic Activity: 8-22 mins  03/19/2023  AB, OTR/L  Acute Rehabilitation Services  Office: [phone]   Tristan Schroeder 03/19/2023, 6:11 PM

## 2023-03-19 NOTE — Consult Note (Signed)
[facility] Face-to-Face Psychiatry Consult   Reason for Consult:  schizoaffective disorder and bipolar disorder Referring Physician:  Dr. Ramiro Harvest  Patient Identification: Madison [name] MRN:  [id number] Principal Diagnosis: ESRD on hemodialysis [facility]) Diagnosis:  Principal Problem:   ESRD on hemodialysis (HCC) Active Problems:   Schizoaffective disorder, bipolar type (HCC)   Hip fracture, left (HCC)   Malnutrition of moderate degree   Hematochezia   Constipation   Gastroesophageal reflux disease   BRBPR (bright red blood per rectum)   Leukocytosis   Sinus tachycardia   Anemia due to chronic kidney disease, on chronic dialysis (HCC)   Total Time spent with patient: 1 hour  PT is a poor historian, level 5 caveat.   Subjective:   Madison [name] is a 72 y.o. female patient admitted with ESRD  Today, patient is seen eating lunch with her sister. She interacts minimally with examiner and answers most questions with some variant of "I don't know" or "I don't want to answer". She did deny SI "I won't have to do that" but didn't really answer any other questions meaningfully. She indicated some general paranoia to healthcare staff. Sister noted she has been more withdrawn but had no immediate safety concerns. She does not appear to be responding to internal or external stimuli, or displaying delusional thoughts.  HPI:  Per Dr. Debby Bud on 2023/03/08, [name] is a 72 y.o. female with a medical h/o ESRD, schizoaffective disorder, bipolar type,  who presented today to the [facility] emergency department complaining of a fall that occurred on Saturday, the  Circumstances of which are unclear. EMS reports that they were told by the nursing facility that patient fell but gave little additional information. Patient has been nonambulatory ever since then. Patient's family apparently came and visited today and that prompted the facility to send patient to the ED. Patient initially complained of left hip  pain but it did not complain of bilateral hip pain. No other injuries were report or found. In the [facility] ED xray revealed a left femoral hip fracture. CXR w/ NAD, K 5.8, BUN 64, Cr 10.75, Glucose 116, Albumin 2.8, T. Protein 7.2, LFTs nl, PT/INR nl, CBC diff nl. HD services unavailable at [facility] and the patient subsequently accepted in transfer to [facility] service at [facility]. For Ortho Mr. Tinnie Gens, [place] and ortho service will see patient in consultation for ORIF probably 02/27/23.      Past Psychiatric History: Sister reported patient has had a diagnosis of schizophrenia since the age of 40.  Risk to Self:   Denies Risk to Others:   UTA Prior Inpatient Therapy:  UTA Prior Outpatient Therapy:  UTA  Past Medical History:  Past Medical History:  Diagnosis Date   Anxiety    Bipolar disorder (HCC)    ESRD (end stage renal disease) on dialysis (HCC)    Neuropathy    Schizophrenia (HCC)     Past Surgical History:  Procedure Laterality Date   BIOPSY  03/12/2023   Procedure: BIOPSY;  Surgeon: Imogene Burn, MD;  Location: [facility] ENDOSCOPY;  Service: Gastroenterology;;   CHOLECYSTECTOMY     COLONOSCOPY N/A 01/20/2016   Dr. fields: 12 mm tubular adenoma removed from the transverse colon, 4 hyperplastic polyps removed from the rectum and sigmoid colon.  Next colonoscopy planned for April 2020.   ESOPHAGOGASTRODUODENOSCOPY N/A 12/19/2017   Procedure: ESOPHAGOGASTRODUODENOSCOPY (EGD);  Surgeon: West Bali, MD;  [facility];  Service: Endoscopy;  Laterality: N/A;  8:30am  fistula left arm     FLEXIBLE SIGMOIDOSCOPY N/A Mar 26, 2023   Procedure: FLEXIBLE SIGMOIDOSCOPY;  Surgeon: Imogene Burn, MD;  Location: [facility] ENDOSCOPY;  Service: Gastroenterology;  Laterality: N/A;   GIVENS CAPSULE STUDY N/A 01/14/2018   Procedure: GIVENS CAPSULE STUDY;  Surgeon: West Bali, MD;  Location: [facility];  Service: Endoscopy;  Laterality: N/A;  7:30am   HEMICOLECTOMY Right    HIP ARTHROPLASTY Left 02/27/2023    Procedure: ARTHROPLASTY BIPOLAR HIP (HEMIARTHROPLASTY);  Surgeon: Myrene Galas, MD;  Location: [facility] OR;  Service: Orthopedics;  Laterality: Left;   IR AV DIALY SHUNT INTRO NEEDLE/INTRACATH INITIAL W/PTA/IMG LEFT  03/04/2023   IR US GUIDE VASC ACCESS LEFT  03/04/2023   TUBAL LIGATION     Family History:  Family History  Problem Relation Age of Onset   Alzheimer's disease Mother    Cancer Mother        pancreatic   Diabetes Mother    Prostate cancer Father    Kidney failure Sister    Breast cancer Neg Hx    Colon cancer Neg Hx    Family Psychiatric  History: Noncontributory. Per chart review patient mother diagnosed with dementia.  Social History:  Social History   Substance and Sexual Activity  Alcohol Use No     Social History   Substance and Sexual Activity  Drug Use No    Social History   Socioeconomic History   Marital status: Single    Spouse name: Not on file   Number of children: Not on file   Years of education: Not on file   Highest education level: Not on file  Occupational History   Not on file  Tobacco Use   Smoking status: Never   Smokeless tobacco: Never  Vaping Use   Vaping Use: Never used  Substance and Sexual Activity   Alcohol use: No   Drug use: No   Sexual activity: Not Currently    Birth control/protection: Surgical    Comment: hyst/tubal  Other Topics Concern   Not on file  Social History Narrative   Not on file   Social Determinants of Health   Financial Resource Strain: Not on file  Food Insecurity: Not on file  Transportation Needs: Not on file  Physical Activity: Not on file  Stress: Not on file  Social Connections: Not on file   Additional Social History:    Allergies:  No Known Allergies  Labs:  Results for orders placed or performed during the hospital encounter of 02/24/2023 (from the past 48 hour(s))  Renal function panel     Status: Abnormal   Collection Time: 03/18/23  4:03 AM  Result Value Ref Range   Sodium 132  (L) 135 - 145 mmol/L   Potassium 3.8 3.5 - 5.1 mmol/L   Chloride 91 (L) 98 - 111 mmol/L   CO2 25 22 - 32 mmol/L   Glucose, Bld 99 70 - 99 mg/dL    Comment: Glucose reference range applies only to samples taken after fasting for at least 8 hours.   BUN 43 (H) 8 - 23 mg/dL   Creatinine, Ser 3.66 (H) 0.44 - 1.00 mg/dL   Calcium 9.8 8.9 - 44.0 mg/dL   Phosphorus 5.2 (H) 2.5 - 4.6 mg/dL   Albumin 1.9 (L) 3.5 - 5.0 g/dL   GFR, Estimated 7 (L) >60 mL/min    Comment: (NOTE) Calculated using the CKD-EPI Creatinine Equation (2021)    Anion gap 16 (H) 5 - 15  Comment: Performed at [facility], [address], [place], [place] [address]  CBC     Status: Abnormal   Collection Time: 03/18/23  4:03 AM  Result Value Ref Range   WBC 14.2 (H) 4.0 - 10.5 K/uL   RBC 2.36 (L) 3.87 - 5.11 MIL/uL   Hemoglobin 6.7 (LL) 12.0 - 15.0 g/dL    Comment: REPEATED TO VERIFY THIS CRITICAL RESULT HAS VERIFIED AND BEEN CALLED TO VIVEANA LOPEZ RN BY SHERRY GALLOWAY ON 06 24 2024 AT 0531, AND HAS BEEN READ BACK.     HCT 21.0 (L) 36.0 - 46.0 %   MCV 89.0 80.0 - 100.0 fL   MCH 28.4 26.0 - 34.0 pg   MCHC 31.9 30.0 - 36.0 g/dL   RDW 56.2 (H) 13.0 - 86.5 %   Platelets 433 (H) 150 - 400 K/uL   nRBC 0.0 0.0 - 0.2 %    Comment: Performed at [facility], [address] [place], [place] [address]  Type and screen [facility]     Status: None   Collection Time: 03/18/23  7:21 AM  Result Value Ref Range   ABO/RH(D) B POS    Antibody Screen NEG    Sample Expiration 03/21/2023,2359    Unit Number [id number]    Blood Component Type RED CELLS,LR    Unit division 00    Status of Unit ISSUED,FINAL    Transfusion Status OK TO TRANSFUSE    Crossmatch Result      Compatible Performed at [facility], [address], [place], [place] [address]   Prepare RBC (crossmatch)     Status: None   Collection Time: 03/18/23  7:21 AM  Result Value Ref Range   Order Confirmation      ORDER  PROCESSED BY BLOOD BANK Performed at [facility], [address], [place], [place] [address]   Hemoglobin and hematocrit, blood     Status: Abnormal   Collection Time: 03/18/23  7:25 PM  Result Value Ref Range   Hemoglobin 8.5 (L) 12.0 - 15.0 g/dL    Comment: REPEATED TO VERIFY POST TRANSFUSION SPECIMEN    HCT 25.5 (L) 36.0 - 46.0 %    Comment: Performed at [facility], [address], [place], [place] [address]  Renal function panel     Status: Abnormal   Collection Time: 03/19/23 12:54 AM  Result Value Ref Range   Sodium 135 135 - 145 mmol/L   Potassium 3.3 (L) 3.5 - 5.1 mmol/L   Chloride 95 (L) 98 - 111 mmol/L   CO2 26 22 - 32 mmol/L   Glucose, Bld 81 70 - 99 mg/dL    Comment: Glucose reference range applies only to samples taken after fasting for at least 8 hours.   BUN 17 8 - 23 mg/dL   Creatinine, Ser 4.25 (H) 0.44 - 1.00 mg/dL   Calcium 9.6 8.9 - 95.6 mg/dL   Phosphorus 4.3 2.5 - 4.6 mg/dL   Albumin 1.8 (L) 3.5 - 5.0 g/dL   GFR, Estimated 14 (L) >60 mL/min    Comment: (NOTE) Calculated using the CKD-EPI Creatinine Equation (2021)    Anion gap 14 5 - 15    Comment: Performed at [facility], [address], [place], [place] [address]  CBC     Status: Abnormal   Collection Time: 03/19/23 12:54 AM  Result Value Ref Range   WBC 12.6 (H) 4.0 - 10.5  K/uL   RBC 2.80 (L) 3.87 - 5.11 MIL/uL   Hemoglobin 8.0 (L) 12.0 - 15.0 g/dL   HCT 29.5 (L) 62.1 - 30.8 %   MCV 86.1 80.0 - 100.0 fL   MCH 28.6 26.0 - 34.0 pg   MCHC 33.2 30.0 - 36.0 g/dL   RDW 65.7 (H) 84.6 - 96.2 %   Platelets 391 150 - 400 K/uL   nRBC 0.0 0.0 - 0.2 %    Comment: Performed at [facility], [address], [place], [place] [address]    Current Facility-Administered Medications  Medication Dose Route Frequency Provider Last Rate Last Admin   0.9 %  sodium chloride infusion (Manually program via Guardrails IV Fluids)   Intravenous Once Luiz Iron, NP       acetaminophen  (TYLENOL) tablet 650 mg  650 mg Oral TID Montez Morita, PA-C   650 mg at 03/19/23 1026   ARIPiprazole (ABILIFY) tablet 15 mg  15 mg Oral Daily Rodolph Bong, MD   15 mg at 03/19/23 1026   benztropine (COGENTIN) tablet 0.25 mg  0.25 mg Oral BID Marjean Imperato A       bisacodyl (DULCOLAX) suppository 10 mg  10 mg Rectal Daily Imogene Burn, MD   10 mg at 03/16/23 0940   Chlorhexidine Gluconate Cloth 2 % PADS 6 each  6 each Topical Q0600 Estanislado Emms, MD   6 each at 03/17/23 1324   Chlorhexidine Gluconate Cloth 2 % PADS 6 each  6 each Topical Q0600 Maxie Barb, MD   6 each at 03/19/23 0653   [START ON 03/20/2023] Darbepoetin Alfa (ARANESP) injection 100 mcg  100 mcg Subcutaneous Q Wed-1800 Anthony Sar, MD       docusate (COLACE) 50 MG/5ML liquid 100 mg  100 mg Oral BID Sheikh, Omair Latif, DO   100 mg at 03/19/23 1026   feeding supplement (BOOST / RESOURCE BREEZE) liquid 1 Container  1 Container Oral TID BM Sheikh, Omair [place], DO   1 Container at 03/19/23 1027   fentaNYL (SUBLIMAZE) injection 12.5 mcg  12.5 mcg Intravenous Q2H PRN Montez Morita, PA-C   12.5 mcg at 03/12/23 1802   haloperidol lactate (HALDOL) injection 1-2 mg  1-2 mg Intravenous Q6H PRN Marguerita Merles Latif, DO   1 mg at 03/12/23 1802   menthol-cetylpyridinium (CEPACOL) lozenge 3 mg  1 lozenge Oral PRN Montez Morita, PA-C       Or   phenol (CHLORASEPTIC) mouth spray 1 spray  1 spray Mouth/Throat PRN Montez Morita, PA-C       midodrine (PROAMATINE) tablet 10 mg  10 mg Oral TID WC Rodolph Bong, MD   10 mg at 03/19/23 1349   mirtazapine (REMERON) tablet 15 mg  15 mg Oral QHS Montez Morita, PA-C   15 mg at 03/18/23 2124   multivitamin (RENA-VIT) tablet 1 tablet  1 tablet Oral QHS Lonia Blood, MD   1 tablet at 03/18/23 2124   ondansetron (ZOFRAN) tablet 4 mg  4 mg Oral Q6H PRN Montez Morita, PA-C       Or   ondansetron [facility]) injection 4 mg  4 mg Intravenous Q6H PRN Montez Morita, PA-C       Oral care mouth rinse   15 mL Mouth Rinse 4 times per day Marguerita Merles Latif, DO   15 mL at 03/19/23 1200   Oral care mouth rinse  15 mL Mouth Rinse PRN Merlene Laughter, DO  oxyCODONE (Oxy IR/ROXICODONE) immediate release tablet 5-10 mg  5-10 mg Oral Q4H PRN Montez Morita, PA-C   5 mg at 03/19/23 1401   pantoprazole (PROTONIX) injection 40 mg  40 mg Intravenous Q12H Sheikh, Kateri Mc [place], DO   40 mg at 03/19/23 1026   piperacillin-tazobactam (ZOSYN) IVPB 2.25 g  2.25 g Intravenous Q8H Rexford Maus, RPH 100 mL/hr at 03/19/23 0651 2.25 g at 03/19/23 0651   polyethylene glycol (MIRALAX / GLYCOLAX) packet 17 g  17 g Oral BID Unk Lightning, [place]   17 g at 03/19/23 0830   senna-docusate (Senokot-S) tablet 1 tablet  1 tablet Oral BID Rodolph Bong, MD   1 tablet at 03/19/23 1026   sevelamer carbonate (RENVELA) tablet 1,600 mg  1,600 mg Oral TID WC Dagoberto Ligas, MD   1,600 mg at 03/19/23 1349   valproate (DEPACON) 250 mg in dextrose 5 % 50 mL IVPB  250 mg Intravenous Q12H Maryagnes Amos, FNP 52.5 mL/hr at 03/19/23 1356 250 mg at 03/19/23 1356    Musculoskeletal: Strength & Muscle Tone: Pt with notable jaw tremor and stiffness in b/l u/e. Did not assess l/e tone bc pt with recent broken hip.  Gait & Station: UTA Patient leans: N/A  Psychiatric Specialty Exam:  Presentation  General Appearance:  Appropriate for Environment  Eye Contact: Minimal  Speech: -- (soft, difficult to understand)  Speech Volume: Decreased  Handedness: Right   Mood and Affect  Mood: -- (did not state)  Affect: Blunt   Thought Process  Thought Processes: Disorganized  Descriptions of Associations:Loose  Orientation:-- (did not answer orientation questions)  Thought Content:Paranoid Ideation (minimal spontaneous content)  History of Schizophrenia/Schizoaffective disorder:Yes  Duration of Psychotic Symptoms:Less than six months  Hallucinations:Hallucinations: None   Ideas of  Reference:Paranoia  Suicidal Thoughts:Suicidal Thoughts: No   Homicidal Thoughts:Homicidal Thoughts: No    Sensorium  Memory: Immediate Poor; Recent Poor; Remote Poor  Judgment: Impaired  Insight: Lacking   Executive Functions  Concentration: Poor  Attention Span: Poor  Recall: Poor  Fund of Knowledge: Poor  Language: Poor   Psychomotor Activity  Psychomotor Activity: Psychomotor Activity: Psychomotor Retardation    Assets  Assets: Housing; Social Support; Financial Resources/Insurance   Sleep  Sleep: Sleep: Poor    Physical Exam: Physical Exam Vitals and nursing note reviewed.  Constitutional:      Appearance: Normal appearance. She is normal weight.  Neurological:     General: No focal deficit present.     Mental Status: She is alert. Mental status is at baseline. She is disoriented.     Comments: Fine orofacial tremors noted  Psychiatric:        Attention and Perception: Perception normal. She is inattentive.        Mood and Affect: Affect normal.        Behavior: Behavior normal. Behavior is cooperative.        Thought Content: Thought content is paranoid.        Cognition and Memory: Cognition is impaired. Memory is impaired.        Judgment: Judgment is inappropriate.    Review of Systems  Psychiatric/Behavioral:  Negative for substance abuse and suicidal ideas. The patient is nervous/anxious.   All other systems reviewed and are negative.  Blood pressure 120/71, pulse 100, temperature 97.7 F (36.5 C), temperature source Axillary, resp. rate 20, height 5\' 4"  (1.626 m), weight 50.6 kg, SpO2 100 %. Body mass index is 19.15 kg/m.  Treatment Plan Summary:  Medication management and Plan    -Will restart cogentin at 50% of previous dose given increased tone and tremor on exam today.   - valproic acid moved to IV (0.25 mg BID)yesterday, need to get a sense of if dose appropriate and pt frequently refusing PO (can convert back to PO  after with no dose adjustment.) -- repeat vpa later this week -- other meds include abilify 15, mirtazapine 15  - appreciate TOC, likely dc to snf.   -Patient will benefit from outpatient neurology for neurocognitive consult.   Labs reviewed with multiple abnormalities --> defer to primary team.  TSH elevated (5.22) abnormal TSH levels can contribute to worsening neurocognitive impairment in geriatric patients.    Psych consult to sign off.   Disposition: No evidence of imminent risk to self or others at present.   Patient does not meet criteria for psychiatric inpatient admission. Supportive therapy provided about ongoing stressors.   While future psychiatric events cannot be accurately predicted, the patient does not currently require acute inpatient psychiatric care and does not currently meet [facility] involuntary commitment criteria. Psychiatry will sign off.  Please re-consult for any future acute psychiatric concerns.   Daishia Fetterly A Horald Birky

## 2023-03-20 DIAGNOSIS — Z992 Dependence on renal dialysis: Secondary | ICD-10-CM | POA: Diagnosis not present

## 2023-03-20 DIAGNOSIS — N186 End stage renal disease: Secondary | ICD-10-CM | POA: Diagnosis not present

## 2023-03-20 DIAGNOSIS — Z515 Encounter for palliative care: Secondary | ICD-10-CM | POA: Diagnosis not present

## 2023-03-20 DIAGNOSIS — E440 Moderate protein-calorie malnutrition: Secondary | ICD-10-CM | POA: Diagnosis not present

## 2023-03-20 DIAGNOSIS — Z7189 Other specified counseling: Secondary | ICD-10-CM | POA: Diagnosis not present

## 2023-03-20 LAB — CBC
HCT: 24.1 % — ABNORMAL LOW (ref 36.0–46.0)
Hemoglobin: 8 g/dL — ABNORMAL LOW (ref 12.0–15.0)
MCH: 29 pg (ref 26.0–34.0)
MCHC: 33.2 g/dL (ref 30.0–36.0)
MCV: 87.3 fL (ref 80.0–100.0)
Platelets: 367 10*3/uL (ref 150–400)
RBC: 2.76 MIL/uL — ABNORMAL LOW (ref 3.87–5.11)
RDW: 18.2 % — ABNORMAL HIGH (ref 11.5–15.5)
WBC: 15.2 10*3/uL — ABNORMAL HIGH (ref 4.0–10.5)
nRBC: 0 % (ref 0.0–0.2)

## 2023-03-20 LAB — RENAL FUNCTION PANEL
Albumin: 1.8 g/dL — ABNORMAL LOW (ref 3.5–5.0)
Anion gap: 17 — ABNORMAL HIGH (ref 5–15)
BUN: 25 mg/dL — ABNORMAL HIGH (ref 8–23)
CO2: 24 mmol/L (ref 22–32)
Calcium: 9.8 mg/dL (ref 8.9–10.3)
Chloride: 89 mmol/L — ABNORMAL LOW (ref 98–111)
Creatinine, Ser: 4.98 mg/dL — ABNORMAL HIGH (ref 0.44–1.00)
GFR, Estimated: 9 mL/min — ABNORMAL LOW (ref >60)
Glucose, Bld: 91 mg/dL (ref 70–99)
Phosphorus: 5.8 mg/dL — ABNORMAL HIGH (ref 2.5–4.6)
Potassium: 3.7 mmol/L (ref 3.5–5.1)
Sodium: 130 mmol/L — ABNORMAL LOW (ref 135–145)

## 2023-03-20 MED ORDER — ENSURE ENLIVE PO LIQD
237.0000 mL | Freq: Two times a day (BID) | ORAL | Status: DC
  Administered 2023-03-21 – 2023-03-22 (×3): 237 mL via ORAL

## 2023-03-20 NOTE — Hospital Course (Signed)
-  TSH elevated (5.22) abnormal TSH levels can contribute to worsening neurocognitive impairment in geriatric patients.

## 2023-03-20 NOTE — Progress Notes (Signed)
   03/20/23 2225  Assess: MEWS Score  Temp 99.2 F (37.3 C)  BP 138/72  MAP (mmHg) 92  Pulse Rate (!) 102  ECG Heart Rate (!) 117  Resp 20  Level of Consciousness Alert  SpO2 93 %  O2 Device Room Air  Assess: MEWS Score  MEWS Temp 0  MEWS Systolic 0  MEWS Pulse 2  MEWS RR 0  MEWS LOC 0  MEWS Score 2  MEWS Score Color Yellow  Assess: if the MEWS score is Yellow or Red  Were vital signs taken at a resting state? Yes  Focused Assessment Change from prior assessment (see assessment flowsheet)  Does the patient meet 2 or more of the SIRS criteria? No  Does the patient have a confirmed or suspected source of infection? No  MEWS guidelines implemented  Yes, yellow  Treat  MEWS Interventions Considered administering scheduled or prn medications/treatments as ordered  Take Vital Signs  Increase Vital Sign Frequency  Yellow: Q2hr x1, continue Q4hrs until patient remains green for 12hrs  Escalate  MEWS: Escalate Yellow: Discuss with charge nurse and consider notifying provider and/or RRT  Notify: Charge Nurse/RN  Name of Charge Nurse/RN Notified Clare Charon, RN  Provider Notification  Provider Name/Title Anthoney Harada, NP  Date Provider Notified 03/20/23  Time Provider Notified 2300  Method of Notification Page  Notification Reason Change in status;Other (Comment) (Yellow MEWS protocol, Bright red blood from the rectum)  Provider response See new orders  Date of Provider Response 03/20/23  Time of Provider Response 2310  Assess: SIRS CRITERIA  SIRS Temperature  0  SIRS Pulse 1  SIRS Respirations  0  SIRS WBC 0  SIRS Score Sum  1

## 2023-03-20 NOTE — Consult Note (Signed)
Brief Psychiatry Consult Note  I decided to see this pt today to see if resumption of cogentin had led to decreased rigidity and jaw tremor. She did have a reduciton in jaw tremor, and a mild reduciton in rigidity based on physical exam. She noted that she felt much less stiff than yesterday. She denied SI, HI, AH/VH, but quickly became tearful saying "psychiatry is here" and then crying.   - had switched to IV due to frequent refusal of pills and inability to get a reliable level - get VPA Level on F AM - OK to convert to sprinkles or pills at 1:1 after.  - if supra- therapeutic (or change in mental status), please reconsult psychiatry  - dosing is very conservative, pending continued improvement in behaviors would likely not inc level given last documented albumin of 1.8   Psychiatry signing off.   Haneef Hallquist A Davin Archuletta

## 2023-03-20 NOTE — Progress Notes (Signed)
PROGRESS NOTE   AVYNN KLASSEN  WUJ:[id number] DOB: 05/21/1951 DOA: 03/12/2023 PCP: Charlynne Pander, MD   Date of Service: the patient was seen and examined on 03/20/2023  Brief Narrative:   The patient is a 72 year old SNF resident with past medical history significant for but limited to ESRD on hemodialysis Monday Wednesday Friday, hypertension, history of schizophrenia versus bipolar disorder as well as other comorbidities who presented to the [facility] ER after a fall which occurred multiple days prior.  X-rays at [facility] revealed a left femoral fracture and she was transferred to [facility] given that she needed a combination of the orthopedist as well as inpatient dialysis.  She underwent unipolar Hemi arthroplasty of the left hip on 02/24/2023 and has been deemed stable from a orthopedic standpoint.  She had issues with her dialysis catheter which has not been fixed and she is getting dialysis.  She was extremely agitated and hysterically crying early on during the hospitalization and remained emotionally labile and tearful however was calmer.   We were anticipating discharging back to SNF with resumption of her normal dialysis session on Friday, 03/08/2023 however a barrier to discharge was that the patient will need to be able to tolerate sitting in a chair for 4 to 5 hours to be appropriate for outpatient HD for discharge.  She was able to dialyze in the chair early on during the hospitalization, appropriately and had no issues with that but heart rate remained elevated in the setting of pain and agitation so she was initiated on a beta-blocker and given some pain medication.    Heart rate was improved however she now has a slight leukocytosis and after repeat is trending down.  TOC attempted to call the facility and got an response so unfortunately could not discharge given that no one at the bedside picked up.  Case worker is informed that the patient's legal guardian was unhappy with the SNF  choice and requested to change her SNF placement to [facility].  [facility] is able to take the patient at their facility  however because of the symptoms changed her dialysis center will need to be moved.     CBC continue to further worsen as she had bright red blood per rectum and was found to be impacted.  Having large stool ball and her bowel regimen has been escalated and obtained CT scan.  GI was consulted given her hemoglobin and bleeding and has bleeding noted to be subsiding, GI not recommending endoscopic procedures at this time.  GI feels patient bleeding likely secondary to inflammation in the rectum due to stercoral colitis, and the meds recommended for patient's constipation a few no further additional bleeding at this time..  Will need to continue monitor carefully.  Assessment and Plan:  Left femoral neck fracture 2/2 fall  S/p unipolar hemiarthroplasty of the left hip 03/21/2023 without any significant complications. -Continue current pain regimen of oxycodone 5 to 10 mg every 4 hours as needed moderate pain, acetaminophen 650 mg 3 times daily, fentanyl 12.5 mg every 2 hours as needed for severe pain. -Orthopedics recommending WBAT left lower extremity with assistance and posterior hip precautions for 12 weeks with daily wound care as needed and DVT prophylaxis with subcu heparin x 28 days. -Continue current bowel regimen. -Outpatient follow-up with orthopedic surgery 10 days postdischarge.   Schizoaffective disorder/bipolar type with uncontrollable crying and agitation intermittently/acute metabolic encephalopathy Patient noted to have been extremely agitated and anxious a few days ago  however noted to be drowsy on 03/13/2023, alert 03/14/2023 however tearful and noted to have intermittent agitation and confusion.    -Psychiatry consulted for uncontrollable crying and intermittent agitation. Has seen pt multiple times during admission: switched to IV meds due to frequent pill  refusal. VPA level in am, if ok convert to sprinkles, if supra therapeutic then reconsult psych. -Continue home regimen Abilify, Depakote, Cogentin and mirtazapine. -Haldol as needed.    ESRD on HD MWF Hyperphosphatemia Patient noted with ongoing issues with dialysis cannulation which has been corrected post ultrasound-guided access of left upper extremity fistula for balloon angioplasty of outflow stenosis at the axillary vein with no residual on completion per IR 03/04/2023 -Nephrology following: patient needs to be dialyzed in the chair prior to being discharged and needs improved mental status prior to being set up for outpatient dialysis so she is able to communicate his needs during hemodialysis.   -Continue sevelamer 1600 mg 3 times daily, avoid nephrotoxic medications, contrast dyes, hypotension and dehydration to ensure adequate renal perfusion.   Sinus tachycardia Noted in the setting of GI bleed constipation. CT abdomen and pelvis with concerns for constipation, rectal wall thickening, perirectal stranding, fecal impaction. -Holding beta-blocker in the setting of soft blood pressure.     Constipation and fecal impaction CT abdomen and pelvis done with large amount of stool in the rectum, wall thickening in the rectum perirectal stranding, findings suggesting fecal impaction and possible proctitis. No evidence of intestinal obstruction or pneumoperitoneum, no hydronephrosis. -Patient seen in consultation by GI. -Patient received milk of molasses enema with good results on 03/12/2023.   -Patient given another milk and molasses enema and a smog enema on 03/13/2023 with large amount of stool noted with no blood in it.  -Continue current bowel regimen of MiraLAX twice daily, Senokot-S twice daily. -Patient seen by GI and underwent flexible sigmoidoscopy 03/07/2023 which showed large amount of solid stool in the rectum, partially cleared, 15 mm polyp in the rectum biopsied, multiple ulcers in  the rectum biopsied. -Post flex sigmoidoscopy GI recommending ongoing bowel regimen and enemas for further stool evacuation and to avoid constipation as this will worsen stercoral ulcers, outpatient follow-up wth [facility] for consideration of EGD/colonoscopy for further evaluation of anemia and removal of colon polyps in the future once patient has recovered from her recent hip fracture.  -GI following.   Community-acquired pneumonia WBC 15.2, increased from 12.6 yesterday, sats 100% on room air. Blood cx negative at 5 days.  CT scan done showed small new patchy infiltrates in both lower lower lung fields suggest atelectasis/pneumonia.  Small bilateral pleural effusions more on the left.  Small pericardial effusion, coronary artery disease. Repeat CXR on 03/13/2023  lower lung volumes with increasing patchy left basilar airspace disease, suspicious for pneumonia.  Small bilateral effusions.  Patient was started on Zosyn on 03/14/2023 -Continue Zosyn  Leukocytosis in the setting of BRBPR Initially felt to be reactive but trended up and now in the setting of likely GI bleed and constipation concerns for stercoral colitis.Blood cultures ordered and negative x 5 days.  CT abdomen and pelvis done with large amount of stool in the rectum, wall thickening in the rectum and perirectal stranding findings suggestive fecal impaction and possible proctitis.  No evidence of intestinal obstruction or pneumoperitoneum.  No hydronephrosis. -Continue empiric IV Zosyn to complete a course of antibiotic treatment..     Anemia of chronic kidney disease/postop anemia/BRBPR and concern for GI bleed (Likely stercoral colitis) Hb 8  this morning. Stable from 8 yesterday. Patient with no significant further bleeding. S/p 2 units PRBCs during hemodialysis today 03/18/2023. -Hb transfusion threshold hemoglobin < 7 -Continue monitor with CBC -GI consulted and patient underwent flexible sigmoidoscopy 03/17/2023 which showed large  amount of solid stool in the rectum, partially cleared, 15 mm polyp in the rectum biopsied, multiple ulcers in the rectum biopsied. -Post flex sigmoidoscopy GI recommending ongoing bowel regimen and enemas for further stool evacuation and to avoid constipation as this will worsen stercoral ulcers, outpatient follow-up with [facility] for consideration of EGD/colonoscopy for further evaluation of anemia and removal of colon polyps in the future once patient has recovered from her recent hip fracture. -Continue Darbepoetin alfa 60 mcg q. Mondays.   Nonsevere protein calorie malnutrition, moderate Secondary to chronic illnesses (ESRD, schizoaffective disorder) as evidenced by moderate fat depletion depletion. -Dietitian following, discussed option of cotrak today. Palliative team to discuss this with her sister to see of this is an option.  -Palliative care consulted and following.    GERD -Continue PPI.    Hypoalbuminemia Chronic, stable    Soft blood pressure BP 111/67 -Midodrine increased to 10 mg 3 times daily for better blood pressure control.  -Beta-blocker held  Subjective:  Pt tearful this morning stating she had a headache and leg pain. She was receiving her pain medication when I went to see her.   Physical Exam:  Vitals:   03/19/23 2044 03/20/23 0445 03/20/23 0500 03/20/23 0759  BP: (!) 103/55 (!) 115/49  111/67  Pulse: 89 85  86  Resp: 20 20  18   Temp:  98.8 F (37.1 C)  98.1 F (36.7 C)  TempSrc:  Oral  Axillary  SpO2: 99% 100%  100%  Weight:   56.1 kg   Height:         General: frail appearing 72 yr old female, mild distress, tearful  Cardio: Normal S1 and S2, RRR, no r/m/g Pulm: CTAB, normal work of breathing Abdomen: Bowel sounds normal. Abdomen soft and non-tender.  Extremities: No peripheral edema.  Neuro: Cranial nerves grossly intact    Data Reviewed:  I have personally reviewed and interpreted labs, imaging.   CBC: Recent Labs  Lab  03/04/2023 1030 03/16/23 0211 03/17/23 0041 03/17/23 1124 03/18/23 0403 03/18/23 1925 03/19/23 0054 03/20/23 0257  WBC 11.2*   < > 13.4* 14.8* 14.2*  --  12.6* 15.2*  NEUTROABS 8.8*  --   --   --   --   --   --   --   HGB 8.2*   < > 7.0* 7.4* 6.7* 8.5* 8.0* 8.0*  HCT 27.2*   < > 22.1* 23.7* 21.0* 25.5* 24.1* 24.1*  MCV 93.8   < > 91.7 92.6 89.0  --  86.1 87.3  PLT 429*   < > 430* 434* 433*  --  391 367   < > = values in this interval not displayed.   Basic Metabolic Panel: Recent Labs  Lab 03/17/23 0041 03/17/23 1124 03/18/23 0403 03/19/23 0054 03/20/23 0257  NA 135 133* 132* 135 130*  K 3.5 3.9 3.8 3.3* 3.7  CL 92* 91* 91* 95* 89*  CO2 28 26 25 26 24   GLUCOSE 107* 119* 99 81 91  BUN 29* 33* 43* 17 25*  CREATININE 4.47* 5.08* 6.28* 3.41* 4.98*  CALCIUM 9.9 10.1 9.8 9.6 9.8  MG 1.7  --   --   --   --   PHOS 4.8* 5.2* 5.2* 4.3 5.8*  GFR: Estimated Creatinine Clearance: 8.7 mL/min (A) (by C-G formula based on SCr of 4.98 mg/dL (H)). Liver Function Tests: Recent Labs  Lab 03/17/23 0041 03/17/23 1124 03/18/23 0403 03/19/23 0054 03/20/23 0257  ALBUMIN 2.0* 2.0* 1.9* 1.8* 1.8*    Coagulation Profile: No results for input(s): "INR", "PROTIME" in the last 168 hours.  Code Status:  Full code.  Code status decision has been confirmed with: patient  Family Communication:    Severity of Illness:  The appropriate patient status for this patient is INPATIENT. Inpatient status is judged to be reasonable and necessary in order to provide the required intensity of service to ensure the patient's safety. The patient's presenting symptoms, physical exam findings, and initial radiographic and laboratory data in the context of their chronic comorbidities is felt to place them at high risk for further clinical deterioration. Furthermore, it is not anticipated that the patient will be medically stable for discharge from the hospital within 2 midnights of admission.   * I certify  that at the point of admission it is my clinical judgment that the patient will require inpatient hospital care spanning beyond 2 midnights from the point of admission due to high intensity of service, high risk for further deterioration and high frequency of surveillance required.*   Author:  Rolm Gala MD  03/20/2023 8:02 AM

## 2023-03-20 NOTE — Progress Notes (Signed)
Patient off unit to dialysis. Patient remains in bed due to being unable to tolerate chair.

## 2023-03-20 NOTE — Progress Notes (Addendum)
   03/20/23 1913  Vitals  Temp 99.2 F (37.3 C)  Temp Source Axillary  BP 128/71  BP Location Right Arm  BP Method Automatic  Patient Position (if appropriate) Lying  Pulse Rate (!) 105  Pulse Rate Source Monitor  Resp (!) 21  Oxygen Therapy  SpO2 100 %  O2 Device Room Air  During Treatment Monitoring  Intra-Hemodialysis Comments Tx completed  Post Treatment  Dialyzer Clearance Lightly streaked  Duration of HD Treatment -hour(s) 3.5 hour(s)  Hemodialysis Intake (mL) 0 mL  Liters Processed 83.7  Fluid Removed (mL) 1000 mL  Tolerated HD Treatment Yes  AVG/AVF Arterial Site Held (minutes) 10 minutes  AVG/AVF Venous Site Held (minutes) 10 minutes  Note  Observations Wgt 53.3kg  Fistula / Graft Left Upper arm Arteriovenous fistula  Placement Date/Time: 04/06/20 0000   Orientation: Left  Access Location: Upper arm  Access Type: Arteriovenous fistula  Site Condition No complications  Fistula / Graft Assessment Thrill;Bruit;Present  Status Deaccessed  Needle Size 15   Pt stable post  treatment. Report given to [facility] RN.

## 2023-03-20 NOTE — Progress Notes (Signed)
Pt had BM and noticed  moderate amount of bright red blood from the rectum. VS taken, on call provider notified. STAT order received for Hemoglobin and Hematocrit and to keep the pt NPO.

## 2023-03-20 NOTE — Progress Notes (Signed)
[facility] Progress Note    Assessment/ Plan:   s/p hemiarthroplasty for left hip fracture: plan for SNF, rehab.    ESRD: On HD per MWF schedule (previously [facility] in [place]). -HD on MWF schedule, will try for HD in chair today (have discussed this with HD staff yesterday)   Anemia CKD: Very high ferritin level.  Continue Aranesp and monitor hemoglobin.  Transfuse as needed for hgb <7. Hgb 8, received 1u prbc yesterday   CKD-MBD: hyperphosphatemia - on sevelamer.  Will need a reduced ca bath for now if able (unfortunately we are limited inpatient).  Phosphorus level stable 5.8   Hypertension: Blood pressure low, on midodrine 10mg  TID. Uf'ing as tolerated with HD   Bright red blood per rectum, proctitis, fecal impaction - per primary service. S/p flex sig 6/21-multiple rectal ulcers, large amount of solid stool in rectum   # Disposition - she is changing SNF's and will need a new outpatient dialysis unit.  (She is not able to be discharged without a new outpatient HD unit confirmed).  Note that at this time she is not acceptable for outpatient HD. She needs to be able to communicate her needs in the outpatient dialysis setting and able to sit on recliner or chair for HD. Of note, apparently sister sits with patient throughout all dialysis treatments chronically. Confirmed with sister at bedside on 6/25  Subjective:   Patient seen and examined bedside. No complaints/acute events.   Objective:   BP 111/67 (BP Location: Right Arm)   Pulse 86   Temp 98.1 F (36.7 C) (Axillary)   Resp 18   Ht 5\' 4"  (1.626 m)   Wt 56.1 kg   SpO2 100%   BMI 21.23 kg/m   Intake/Output Summary (Last 24 hours) at 03/20/2023 1232 Last data filed at 03/20/2023 0700 Gross per 24 hour  Intake 405.08 ml  Output --  Net 405.08 ml   Weight change: 1.9 kg  Physical Exam: Gen: chronically ill appearing, sitting up in bed CVS: RRR Resp: cta b/l, normal WOB Abd: soft,  nt/nd Ext: no edema Neuro: awake Dialysis access: LUE AVF + b/t  Imaging: No results found.  Labs: BMET Recent Labs  Lab 03/14/2023 1007 03/16/23 0211 03/17/23 0041 03/17/23 1124 03/18/23 0403 03/19/23 0054 03/20/23 0257  NA 135 134* 135 133* 132* 135 130*  K 4.8 3.5 3.5 3.9 3.8 3.3* 3.7  CL 92* 92* 92* 91* 91* 95* 89*  CO2 26 28 28 26 25 26 24   GLUCOSE 104* 97 107* 119* 99 81 91  BUN 46* 19 29* 33* 43* 17 25*  CREATININE 5.23* 3.02* 4.47* 5.08* 6.28* 3.41* 4.98*  CALCIUM 10.4* 9.4 9.9 10.1 9.8 9.6 9.8  PHOS 4.9* 3.3 4.8* 5.2* 5.2* 4.3 5.8*   CBC Recent Labs  Lab 03/20/2023 1030 03/16/23 0211 03/17/23 1124 03/18/23 0403 03/18/23 1925 03/19/23 0054 03/20/23 0257  WBC 11.2*   < > 14.8* 14.2*  --  12.6* 15.2*  NEUTROABS 8.8*  --   --   --   --   --   --   HGB 8.2*   < > 7.4* 6.7* 8.5* 8.0* 8.0*  HCT 27.2*   < > 23.7* 21.0* 25.5* 24.1* 24.1*  MCV 93.8   < > 92.6 89.0  --  86.1 87.3  PLT 429*   < > 434* 433*  --  391 367   < > = values in this interval not displayed.    Medications:  acetaminophen  650 mg Oral TID   ARIPiprazole  15 mg Oral Daily   benztropine  0.25 mg Oral BID   bisacodyl  10 mg Rectal Daily   Chlorhexidine Gluconate Cloth  6 each Topical Q0600   Chlorhexidine Gluconate Cloth  6 each Topical Q0600   darbepoetin (ARANESP) injection - NON-DIALYSIS  100 mcg Subcutaneous Q Wed-1800   docusate  100 mg Oral BID   feeding supplement  237 mL Oral BID BM   midodrine  10 mg Oral TID WC   mirtazapine  15 mg Oral QHS   multivitamin  1 tablet Oral QHS   mouth rinse  15 mL Mouth Rinse 4 times per day   pantoprazole (PROTONIX) IV  40 mg Intravenous Q12H   polyethylene glycol  17 g Oral BID   senna-docusate  1 tablet Oral BID   sevelamer carbonate  1,600 mg Oral TID WC      Anthony Sar, MD [facility] 03/20/2023, 12:32 PM

## 2023-03-20 NOTE — Progress Notes (Signed)
Nutrition Follow-up  DOCUMENTATION CODES:   Non-severe (moderate) malnutrition in context of chronic illness, Underweight  INTERVENTION:  Liberalize diet to regular Change Boost Breeze to Ensure Enlive po BID, each supplement provides 350 kcal and 20 grams of protein. Magic cup TID with meals, each supplement provides 290 kcal and 9 grams of protein Renal MVI with minerals daily Continue to recommend Cortrak placement in setting of malnutrition and inadequate po intake throughout admission; discussed with MD; awaiting nutrition GOC with Palliative Care  NUTRITION DIAGNOSIS:   Moderate Malnutrition related to chronic illness (ESRD, schizoaffective disorder) as evidenced by moderate fat depletion, severe fat depletion, moderate muscle depletion. - remains applicable  GOAL:   Patient will meet greater than or equal to 90% of their needs - goal unmet  MONITOR:   PO intake, Supplement acceptance, Diet advancement, Labs, Weight trends, I & O's, Skin  REASON FOR ASSESSMENT:   Consult Hip fracture protocol  ASSESSMENT:   Pt admitted from SNF after multiple falls leading to L femoral fracture. PMH significant for ESRD on HD, HTN and schizoaffective disorder, bipolar type.  6/6 - s/p unipolar hemiarthroplasty of L hip 6/17 - diet downgraded to clears 6/21- s/p flex sig- findings of large amount of solid stool in rectum, partially cleared, 15mm polp in the rectum biopsied, multiple ulcers in rectum biopsied  Psychiatry following d/t intermittent agitation and tearfulness.   Plans for HD in chair today.  Last HD 6/24- net UF 1L.   Briefly spoke with pt at bedside. No family present at time of visit. Pt just reports not liking the way she feels and was tearful but did not provide many other details. Spoke with RN who reports that pt is refusing meds and nutrition supplements today which was also mentioned in report from prior nursing staff.   PO intake has remained inconsistent and  minimal throughout admission despite diet liberalization and nutrition supplements being in place. Discussed nutrition status with MD and recommendation of Cortrak placement. PMT previously assessed and GOC discussion ongoing. MD request to have PMT follow up regarding nutrition GOC in setting of malnutrition and ongoing poor PO intake prior to consideration of Cortrak.    Meal completions: 6/22: 10% dinner 6/23: 50% breakfast, 0% lunch, 75% dinner 6/25: 20% breakfast 6/26: 0% breakfast  Weight has trended up since 06/21 however suspect weight changes r/t to volume status 2/2 ESRD on HD.   Medications: dulcolax, colace, remeron, rena vit, protonix, miralax, senna, renvela, IV abx  Labs: sodium 130, BUN 25, Cr 4.98, anion gap 17, Phos 5.8, GFR 9, CBG's   Diet Order:   Diet Order             Diet regular Room service appropriate? Yes with Assist; Fluid consistency: Thin  Diet effective now                   EDUCATION NEEDS:   Education needs have been addressed  Skin:  Skin Assessment: Reviewed RN Assessment  Last BM:  6/25 (type 6)  Height:   Ht Readings from Last 1 Encounters:  03/04/2023 5\' 4"  (1.626 m)    Weight:   Wt Readings from Last 1 Encounters:  03/20/23 56.1 kg    Ideal Body Weight:  54.5 kg  BMI:  Body mass index is 21.23 kg/m.  Estimated Nutritional Needs:   Kcal:  1500-1700  Protein:  75-85g  Fluid:  1L + UOP  Drusilla Kanner, RDN, LDN Clinical Nutrition

## 2023-03-20 NOTE — Progress Notes (Signed)
Palliative:  HPI:  72 y.o. female  with past medical history of ESRD, schizoaffective disorder bipolar type, anxiety, neuropathy admitted on 02/24/2023 with fall at facility with left hip fracture s/p surgical repair 6/6. Hospitalization complicated by issues with dialysis catheter, agitation and behavioral disturbance, and inability to sit in chair for dialysis, rectal bleeding likely secondary to stercoral colitis.   I met today with Madison [name]. No family at bedside. Madison [name] is more alert and interactive although curled up in side of bed. She did not wish for me to assist her with reposition or adjustment of pillow under her head. She continued to repeat "I'm tired." I attempted to elicit more about what she means by "tired." Unable to provide much context for me. I asked if she was too tired to continue with interventions like dialysis. She did not answer. I acknowledged that she has been through a lot and has been very ill. I asked if she were to become much worse and if her heart and lungs stopped would she desire [place] to pump on her chest and place on her breathing or life support machines -  she nodded her head no and says "no I wouldn't want all that." I reiterated that if she were to get that sick we would keep her comfortable and ensure she does not suffer any more than she already has. She nods yes. Then she asks "am I really that bad?" I did explain that she is still very sick and she is not really eating or drinking enough to help her improve. She returned to repeating to [place] "I'm tired."  I called and discussed with sister and legal guardian, Madison [name]. I reviewed with Madison [name] her sister's progression. Madison [name] felt that Madison [name] appeared much better yesterday during visit and she was able to feed her some of her meal. We revisited code status and I shared my conversation with Madison [name]. Madison [name] shares that Madison [name] complained to her yesterday about people asking her about her wishes and expressed she was tired  of talking about dying. I expressed that I was unaware of this conversation as today was my first good conversation with Madison [name]. I did recommend consideration of DNR based on Aniyha expressed wishes to me today. Madison [name] agrees with DNR based on Madison [name]'s response in my conversation today.   I reviewed with Madison [name] concern for overall poor intake and still not accepting medications consistently. I expressed that the medical team is concerned that if we really want to push forward with measures to prolong life then we may need to consider feeding tube. Madison [name] has never discussed any of these issues with Madison [name] and does not know what Madison [name] would want or not want. We discussed that feeding tube can be considered but sometimes people do not desire to have procedures or have tubes attached to them. We discussed the option to continue to allow Madison [name] to eat and drink what she can but acknowledging this may be very life limiting and she could likely continue to decline. Madison [name] wishes to consider this and wants time before making a decision. Madison [name] shares that a sister plans to visit tomorrow so they can see Madison [name] and discuss further. Madison [name] does wish to continue with dialysis at this time and is hopeful that Madison [name] can sit up in chair to tolerate dialysis.   All questions/concerns addressed. Emotional support provided. Updated Madison [name].   Exam: Thin, frail. Awake, alert. Some of her speech is appropriate but not consistently so. Breathing regular, unlabored. Abd soft,  flat.   Plan: - DNR decided - Considering desire for feeding tube or not - Hopeful to continue with dialysis  50 min  Yong Channel, NP Palliative Medicine Team  Pager [phone] (Please see [email or web address] for schedule) Team Phone [phone]    Greater than 50%  of this time was spent counseling and coordinating care related to the above assessment and plan

## 2023-03-21 DIAGNOSIS — E440 Moderate protein-calorie malnutrition: Secondary | ICD-10-CM | POA: Diagnosis not present

## 2023-03-21 DIAGNOSIS — Z7189 Other specified counseling: Secondary | ICD-10-CM | POA: Diagnosis not present

## 2023-03-21 DIAGNOSIS — N186 End stage renal disease: Secondary | ICD-10-CM | POA: Diagnosis not present

## 2023-03-21 DIAGNOSIS — Z515 Encounter for palliative care: Secondary | ICD-10-CM | POA: Diagnosis not present

## 2023-03-21 DIAGNOSIS — Z992 Dependence on renal dialysis: Secondary | ICD-10-CM | POA: Diagnosis not present

## 2023-03-21 LAB — RENAL FUNCTION PANEL
Albumin: 1.9 g/dL — ABNORMAL LOW (ref 3.5–5.0)
Anion gap: 16 — ABNORMAL HIGH (ref 5–15)
BUN: 12 mg/dL (ref 8–23)
CO2: 25 mmol/L (ref 22–32)
Calcium: 9.6 mg/dL (ref 8.9–10.3)
Chloride: 93 mmol/L — ABNORMAL LOW (ref 98–111)
Creatinine, Ser: 3.32 mg/dL — ABNORMAL HIGH (ref 0.44–1.00)
GFR, Estimated: 14 mL/min — ABNORMAL LOW (ref >60)
Glucose, Bld: 80 mg/dL (ref 70–99)
Phosphorus: 4.7 mg/dL — ABNORMAL HIGH (ref 2.5–4.6)
Potassium: 3.6 mmol/L (ref 3.5–5.1)
Sodium: 134 mmol/L — ABNORMAL LOW (ref 135–145)

## 2023-03-21 LAB — CBC
HCT: 24.2 % — ABNORMAL LOW (ref 36.0–46.0)
Hemoglobin: 7.9 g/dL — ABNORMAL LOW (ref 12.0–15.0)
MCH: 29.6 pg (ref 26.0–34.0)
MCHC: 32.6 g/dL (ref 30.0–36.0)
MCV: 90.6 fL (ref 80.0–100.0)
Platelets: 396 10*3/uL (ref 150–400)
RBC: 2.67 MIL/uL — ABNORMAL LOW (ref 3.87–5.11)
RDW: 18.1 % — ABNORMAL HIGH (ref 11.5–15.5)
WBC: 14.3 10*3/uL — ABNORMAL HIGH (ref 4.0–10.5)
nRBC: 0 % (ref 0.0–0.2)

## 2023-03-21 LAB — HEMOGLOBIN AND HEMATOCRIT, BLOOD
HCT: 26 % — ABNORMAL LOW (ref 36.0–46.0)
Hemoglobin: 8.5 g/dL — ABNORMAL LOW (ref 12.0–15.0)

## 2023-03-21 MED ORDER — ACETAMINOPHEN 500 MG PO TABS
1000.0000 mg | ORAL_TABLET | Freq: Three times a day (TID) | ORAL | Status: DC
  Administered 2023-03-21 – 2023-03-23 (×7): 1000 mg via ORAL
  Filled 2023-03-21 (×7): qty 2

## 2023-03-21 MED ORDER — OXYCODONE HCL 5 MG PO TABS
10.0000 mg | ORAL_TABLET | ORAL | Status: DC | PRN
  Administered 2023-03-22: 10 mg via ORAL
  Filled 2023-03-21: qty 2

## 2023-03-21 NOTE — Progress Notes (Signed)
Pt unable to tolerate HD in chair yesterday. Case discussed with nephrologist and contacted CSW to discuss further. Will assist as needed.   Olivia Canter Renal Navigator [phone]

## 2023-03-21 NOTE — Progress Notes (Addendum)
    Patient Name: Madison [name]           DOB: 13-Aug-1951  MRN: [id number]      Admission Date: 03/12/2023  Attending Provider: Cathleen Corti, MD  Primary Diagnosis: ESRD on hemodialysis [facility])   Level of care: Med-Surg    CROSS COVER NOTE   Date of Service   03/21/2023   Madison [name], 72 y.o. female, was admitted on 02/25/2023 for ESRD on hemodialysis [facility]).    HPI/Events of Note   Nursing staff reports 1 episode of bright red blood from rectum. Hemodynamically stable. Initially hematochezia was spotted on 6/17 Underwent flexible sigmoidoscopy Apr 02, 2023 which showed large amount of solid stool in the rectum, partially cleared, 15 mm polyp in the rectum biopsied, multiple ulcers in the rectum biopsied.   Bright red blood per rectum No other changes reported.  Mentation at baseline, no acute abdominal changes.  Patient denies pain/discomfort to abdomen and rectum.  RN denies visible hemorrhoids or trauma to rectum. No impaction/ constipation reported. Stools have been soft/ loose.  Keep n.p.o. and check on H&H   Interventions/ Plan   H&H--> hemoglobin 8.5, prior 8.0 Keep n.p.o. for tonight        Anthoney Harada, DNP, ACNPC- AG [facility] [place]

## 2023-03-21 NOTE — Progress Notes (Signed)
Palliative:  HPI:  72 y.o. female  with past medical history of ESRD, schizoaffective disorder bipolar type, anxiety, neuropathy admitted on 02/25/2023 with fall at facility with left hip fracture s/p surgical repair 6/6. Hospitalization complicated by issues with dialysis catheter, agitation and behavioral disturbance, and inability to sit in chair for dialysis, rectal bleeding likely secondary to stercoral colitis.   I met today with Nicole Cella. She is awake and alert. She is lying into right siderail. RN pulled her over to center her in bed and she yelled out in pain. She will not tell me where she hurts. When talking and asking how she is doing she continues to reply to me "what does it matter?" I tried to explain that it does matter because we just want to make sure we take good care of her. I spoke with her about feeding tube. I asked if she would ever want a feeding tube and she tells me "no, I don't want that." I encouraged her to get some rest.   Update: Notified by CSW that family was at bedside. I came and spoke with sisters Meriam Sprague and Renea Ee at bedside. We discussed Atziri's status and ongoing issues with poor insight. Meriam Sprague is feeding Kylea and Alie is accepting meal and has finished Ensure. Unfortunately Taijah does much better for family when they are present vs hospital staff but family does not live close by to be as present to assist. I shared with them that Kellsie told me she would not want feeding tube which they respect. Krimson would not tell family her wishes stating "I shouldn't tell."  We further discussed goals of care. Family have good understanding of challenges but remain hopeful. They are very encouraging to [place]. Leona fluctuates between stating "it isn't going to work" and telling us "I want to improve." Courtland ultimately agrees to attempt HD in chair tomorrow. I adjusted medication for Tylenol 1000 mg TID and OxyIR 10 mg PRN - recommend to give OxyIR prior to  transition to chair. Obelia and family would like to continue with dialysis at this time. Dr. Allena Katz also present for part of this discussion.   All questions/concerns addressed. Emotional support provided.   Plan: - DNR - No feeding tube desired - Continue to attempt HD in chair - Pain: Tylenol 1000 mg TID. OxyIR 10 mg q4h PRN (please give prior to activity).   60 min  Yong Channel, NP Palliative Medicine Team Pager [phone] (Please see [email or web address] for schedule) Team Phone [phone]    Greater than 50%  of this time was spent counseling and coordinating care related to the above assessment and plan

## 2023-03-21 NOTE — TOC Progression Note (Signed)
Transition of Care [facility]) - Progression Note    Patient Details  Name: Madison [name] MRN: [id number] Date of Birth: 1951-02-27  Transition of Care [facility]) CM/SW Contact  Lorri Frederick, LCSW Phone Number: 03/21/2023, 3:05 PM  Clinical Narrative:   Message from Tracy/Nephro re: potential placement options.  Nephro MD asking about potential LTAC. Also potential outpt HD center is [place] or [place] that could accommodate pt laying down for HD. Also [facility] [place] may have HD capability onsite.  Jen/[facility] reviewed pt and reports pt would have to sit for HD in order to be candidate for LTAC admit.  CSW spoke with 2 sister of pt: Meriam Sprague and Renea Ee, discussed the above.  They both live in [place].  [place] or [place] would [place] be [place] drives if pt was placed there.  [place] would potentially be better.  They did express understanding of the limited options and are agreeable that these options be explored.  They also were hoping to meet with Alicia/Palliative, CSW sent [place] message.  Referral made to [facility] [facility], LM with Ricky in admissions. Message sent to Tracy/Nephro and she will investigate HD options in [facility] and [place].     Expected Discharge Plan: Skilled Nursing Facility Barriers to Discharge: Continued Medical Work up, SNF Pending bed offer  Expected Discharge Plan and Services In-house Referral: Clinical Social Work   Post Acute Care Choice: Skilled Nursing Facility Living arrangements for the past 2 months: Skilled Nursing Facility ([facility])                                       Social Determinants of Health (SDOH) Interventions SDOH Screenings   Housing: Patient Unable To Answer (03/16/2023)  Alcohol Screen: Low Risk  (12/25/2018)  Tobacco Use: Low Risk  (03/19/2023)    Readmission Risk Interventions    12/27/2022    8:27 AM  Readmission Risk Prevention Plan  Transportation Screening Complete   HRI or Home Care Consult Complete  Social Work Consult for Recovery Care Planning/Counseling Complete  Palliative Care Screening Not Applicable  Medication Review [employer]) Complete

## 2023-03-21 NOTE — Progress Notes (Signed)
[facility] Progress Note    Assessment/ Plan:   s/p hemiarthroplasty for left hip fracture: plan for SNF, rehab.    ESRD: On HD per MWF schedule (previously [facility] in [place]). -HD on MWF schedule, will try for HD in chair again tomorrow   Anemia CKD: Very high ferritin level.  Continue Aranesp and monitor hemoglobin.  Transfuse as needed for hgb <7. Hgb 8, received 1u prbc yesterday   CKD-MBD: hyperphosphatemia - on sevelamer.  Will need a reduced ca bath for now if able (unfortunately we are limited inpatient).  Phosphorus level stable 5.8   Hypertension: Blood pressure low, on midodrine 10mg  TID. Uf'ing as tolerated with HD   Bright red blood per rectum, proctitis, fecal impaction - per primary service. S/p flex sig 6/21-multiple rectal ulcers, large amount of solid stool in rectum   # Disposition - she is changing SNF's and will need a new outpatient dialysis unit.  (She is not able to be discharged without a new outpatient HD unit confirmed).  Note that at this time she is not acceptable for outpatient HD. She needs to be able to communicate her needs in the outpatient dialysis setting and able to sit on recliner or chair for HD. Of note, apparently sister sits with patient throughout all dialysis treatments chronically. Confirmed with sister at bedside on 6/25 6/27: did not tolerate HD in chair on 6/26. This makes her dispo more difficult. Maybe could consider LTAC or a facility that can accommodate HD in stretcher?  Subjective:   Patient seen and examined bedside. No complaints/acute events. Was not able to get in recliner yesterday   Objective:   BP 111/64 (BP Location: Right Arm)   Pulse 100   Temp 98.6 F (37 C) (Oral)   Resp 17   Ht 5\' 4"  (1.626 m)   Wt 53.5 kg   SpO2 98%   BMI 20.25 kg/m   Intake/Output Summary (Last 24 hours) at 03/21/2023 1610 Last data filed at 03/20/2023 1913 Gross per 24 hour  Intake --  Output 1000 ml  Net  -1000 ml   Weight change: 4.3 kg  Physical Exam: Gen: chronically ill appearing, sitting up in bed CVS: RRR Resp: cta b/l, normal WOB Abd: soft, nt/nd Ext: no edema Neuro: awake Dialysis access: LUE AVF + b/t  Imaging: No results found.  Labs: BMET Recent Labs  Lab 03/20/2023 1007 03/16/23 0211 03/17/23 0041 03/17/23 1124 03/18/23 0403 03/19/23 0054 03/20/23 0257  NA 135 134* 135 133* 132* 135 130*  K 4.8 3.5 3.5 3.9 3.8 3.3* 3.7  CL 92* 92* 92* 91* 91* 95* 89*  CO2 26 28 28 26 25 26 24   GLUCOSE 104* 97 107* 119* 99 81 91  BUN 46* 19 29* 33* 43* 17 25*  CREATININE 5.23* 3.02* 4.47* 5.08* 6.28* 3.41* 4.98*  CALCIUM 10.4* 9.4 9.9 10.1 9.8 9.6 9.8  PHOS 4.9* 3.3 4.8* 5.2* 5.2* 4.3 5.8*   CBC Recent Labs  Lab 03/07/2023 1030 03/16/23 0211 03/17/23 1124 03/18/23 0403 03/18/23 1925 03/19/23 0054 03/20/23 0257 03/21/23 0000  WBC 11.2*   < > 14.8* 14.2*  --  12.6* 15.2*  --   NEUTROABS 8.8*  --   --   --   --   --   --   --   HGB 8.2*   < > 7.4* 6.7* 8.5* 8.0* 8.0* 8.5*  HCT 27.2*   < > 23.7* 21.0* 25.5* 24.1* 24.1* 26.0*  MCV 93.8   < >  92.6 89.0  --  86.1 87.3  --   PLT 429*   < > 434* 433*  --  391 367  --    < > = values in this interval not displayed.    Medications:     acetaminophen  650 mg Oral TID   ARIPiprazole  15 mg Oral Daily   benztropine  0.25 mg Oral BID   bisacodyl  10 mg Rectal Daily   Chlorhexidine Gluconate Cloth  6 each Topical Q0600   Chlorhexidine Gluconate Cloth  6 each Topical Q0600   darbepoetin (ARANESP) injection - NON-DIALYSIS  100 mcg Subcutaneous Q Wed-1800   docusate  100 mg Oral BID   feeding supplement  237 mL Oral BID BM   midodrine  10 mg Oral TID WC   mirtazapine  15 mg Oral QHS   multivitamin  1 tablet Oral QHS   mouth rinse  15 mL Mouth Rinse 4 times per day   pantoprazole (PROTONIX) IV  40 mg Intravenous Q12H   polyethylene glycol  17 g Oral BID   senna-docusate  1 tablet Oral BID   sevelamer carbonate  1,600 mg  Oral TID WC      Anthony Sar, MD [facility] 03/21/2023, 9:52 AM

## 2023-03-21 NOTE — Care Management Important Message (Signed)
Important Message  Patient Details  Name: Madison [name] MRN: [id number] Date of Birth: 08-03-1951   Medicare Important Message Given:  Yes     Sherilyn Banker 03/21/2023, 1:42 PM

## 2023-03-21 NOTE — Progress Notes (Signed)
PROGRESS NOTE   Madison [name]  ZOX:[id number] DOB: 02/05/1951 DOA: 2023-03-16 PCP: Charlynne Pander, MD   Date of Service: the patient was seen and examined on 03/21/2023  Brief Narrative:   The patient is a 72 year old SNF resident with past medical history significant for but limited to ESRD on hemodialysis Monday Wednesday Friday, hypertension, history of schizophrenia versus bipolar disorder as well as other comorbidities who presented to the [facility] ER after a fall which occurred multiple days prior.  X-rays at [facility] revealed a left femoral fracture and she was transferred to [facility] given that she needed a combination of the orthopedist as well as inpatient dialysis.  She underwent unipolar Hemi arthroplasty of the left hip on 03/09/2023 and has been deemed stable from a orthopedic standpoint.  She had issues with her dialysis catheter which has not been fixed and she is getting dialysis.  She was extremely agitated and hysterically crying early on during the hospitalization and remained emotionally labile and tearful however was calmer.   We were anticipating discharging back to SNF with resumption of her normal dialysis session on Friday, 03/08/2023 however a barrier to discharge was that the patient will need to be able to tolerate sitting in a chair for 4 to 5 hours to be appropriate for outpatient HD for discharge.  She was able to dialyze in the chair early on during the hospitalization, appropriately and had no issues with that but heart rate remained elevated in the setting of pain and agitation so she was initiated on a beta-blocker and given some pain medication.    Heart rate was improved however she now has a slight leukocytosis and after repeat is trending down.  TOC attempted to call the facility and got an response so unfortunately could not discharge given that no one at the bedside picked up.  Case worker is informed that the patient's legal guardian was unhappy with the SNF  choice and requested to change her SNF placement to [facility].  [facility] is able to take the patient at their facility  however because of the symptoms changed her dialysis center will need to be moved.     CBC continue to further worsen as she had bright red blood per rectum and was found to be impacted.  Having large stool ball and her bowel regimen has been escalated and obtained CT scan.  GI was consulted given her hemoglobin and bleeding and has bleeding noted to be subsiding, GI not recommending endoscopic procedures at this time.  GI feels patient bleeding likely secondary to inflammation in the rectum due to stercoral colitis, and the meds recommended for patient's constipation a few no further additional bleeding at this time..  Will need to continue monitor carefully.  Assessment and Plan:  Left femoral neck fracture 2/2 fall  S/p unipolar hemiarthroplasty of the left hip 02/27/2023 without any significant complications. -Continue current pain regimen of oxycodone 5 to 10 mg every 4 hours as needed moderate pain, acetaminophen 650 mg 3 times daily, fentanyl 12.5 mg every 2 hours as needed for severe pain. -Orthopedics recommending WBAT left lower extremity with assistance and posterior hip precautions for 12 weeks with daily wound care as needed and DVT prophylaxis with subcu heparin x 28 days. -Continue current bowel regimen. -Outpatient follow-up with orthopedic surgery 10 days postdischarge.   Schizoaffective disorder/bipolar type with uncontrollable crying and agitation intermittently/acute metabolic encephalopathy Patient noted to have been extremely agitated and anxious a few days ago  however noted to be drowsy on 03/13/2023, alert 03/14/2023 however tearful and noted to have intermittent agitation and confusion.    -Psychiatry consulted for uncontrollable crying and intermittent agitation. Has seen pt multiple times during admission: switched to IV meds due to frequent pill  refusal. VPA level in am, if ok convert to sprinkles, if supra therapeutic then reconsult psych. -Continue home regimen Abilify, Depakote, Cogentin and mirtazapine. -Haldol as needed.   ESRD on HD MWF Pt was last able to sit up in chair for dialysis 1 month ago. Will continue to work towards this goal. -Nephrology following: patient needs to be dialyzed in the chair prior to being discharged and needs improved mental status prior to being set up for outpatient dialysis so she is able to communicate his needs during hemodialysis.   -Continue sevelamer 1600 mg 3 times daily -Avoid nephrotoxic medications, contrast dyes, hypotension and dehydration to ensure adequate renal perfusion --Continue midodrine 10 mg 3 times  Constipation and fecal impaction Patient had 1 episode of right red rectal bleeding overnight.  Hemoglobin stable at 8.5.  Hemodynamically stable. CT abdomen and pelvis done with large amount of stool in the rectum, wall thickening in the rectum perirectal stranding, findings suggesting fecal impaction and possible proctitis. No evidence of intestinal obstruction or pneumoperitoneum, no hydronephrosis. -Continue current bowel regimen of MiraLAX twice daily, Senokot-S twice daily. -Patient seen by GI and underwent flexible sigmoidoscopy 03/24/2023 which showed large amount of solid stool in the rectum, partially cleared, 15 mm polyp in the rectum biopsied, multiple ulcers in the rectum biopsied. GI recommended: bowel regimen and enemas for further stool evacuation and to avoid constipation as this will worsen stercoral ulcers, outpatient follow-up wth [facility] for consideration of EGD/colonoscopy for further evaluation of anemia and removal of colon polyps in the future once patient has recovered from her recent hip fracture.    Community-acquired pneumonia CT scan done showed small new patchy infiltrates in both lower lower lung fields suggest atelectasis/pneumonia.  Small bilateral  pleural effusions more on the left.  Small pericardial effusion, coronary artery disease. Repeat CXR on 03/13/2023  lower lung volumes with increasing patchy left basilar airspace disease, suspicious for pneumonia.  Small bilateral effusions.  Patient was started on Zosyn on 03/14/2023 -D/c Zosyn  Leukocytosis in the setting of BRBPR Initially felt to be reactive but trended up and now in the setting of likely GI bleed and constipation concerns for stercoral colitis.Blood cultures ordered and negative x 5 days.  CT abdomen and pelvis done with large amount of stool in the rectum, wall thickening in the rectum and perirectal stranding findings suggestive fecal impaction and possible proctitis.  No evidence of intestinal obstruction or pneumoperitoneum.  No hydronephrosis. -D/c Zosyn    Anemia of chronic kidney disease/postop anemia/BRBPR and concern for GI bleed (Likely stercoral colitis) Hb 7.9 this morning. Stable from 8.5 yesterday. Patient with no significant further bleeding. S/p 2 units PRBCs during hemodialysis today 03/18/2023. -Hb transfusion threshold hemoglobin < 7 -Continue monitor with CBC -GI consulted and patient underwent flexible sigmoidoscopy 02/25/2023 which showed large amount of solid stool in the rectum, partially cleared, 15 mm polyp in the rectum biopsied, multiple ulcers in the rectum biopsied. -Post flex sigmoidoscopy GI recommending ongoing bowel regimen and enemas for further stool evacuation and to avoid constipation as this will worsen stercoral ulcers, outpatient follow-up with [facility] for consideration of EGD/colonoscopy for further evaluation of anemia and removal of colon polyps in the future once patient has  recovered from her recent hip fracture. -Continue Darbepoetin alfa 60 mcg q. Mondays.  Nonsevere protein calorie malnutrition, moderate Secondary to chronic illnesses (ESRD, schizoaffective disorder) as evidenced by moderate fat depletion depletion. Pt feeds  better with family at bedside. -Assistance with meals  -Dietitian following, after discussion with family we have agreed no to a cotrak -Palliative care consulted and following.    GERD -Continue PPI.    Sinus tachycardia Noted in the setting of GI bleed constipation.  -Holding beta-blocker in the setting of soft blood pressure.    Hypoalbuminemia Chronic, stable    Subjective:  No acute concerns. Discussed cortrak with sisters and patient, patient declined cotrak.   Physical Exam:  Vitals:   03/20/23 2225 03/21/23 0031 03/21/23 0409 03/21/23 0731  BP: 138/72 (!) 127/59 111/60 111/64  Pulse: (!) 102 (!) 108 100 100  Resp: 20 20 18 17   Temp: 99.2 F (37.3 C) 98.6 F (37 C) 98.4 F (36.9 C) 98.6 F (37 C)  TempSrc: Oral Oral Oral Oral  SpO2: 93% 98% 99% 98%  Weight:      Height:         General: frail appearing 72 yr old female, mild distress, tearful  Cardio: Normal S1 and S2, RRR, no r/m/g Pulm: CTAB, normal work of breathing Abdomen: Bowel sounds normal. Abdomen soft and non-tender.  Extremities: No peripheral edema.  Neuro: Cranial nerves grossly intact    Data Reviewed:  I have personally reviewed and interpreted labs, imaging.   CBC: Recent Labs  Lab 03/24/2023 1030 03/16/23 0211 03/17/23 0041 03/17/23 1124 03/18/23 0403 03/18/23 1925 03/19/23 0054 03/20/23 0257 03/21/23 0000  WBC 11.2*   < > 13.4* 14.8* 14.2*  --  12.6* 15.2*  --   NEUTROABS 8.8*  --   --   --   --   --   --   --   --   HGB 8.2*   < > 7.0* 7.4* 6.7* 8.5* 8.0* 8.0* 8.5*  HCT 27.2*   < > 22.1* 23.7* 21.0* 25.5* 24.1* 24.1* 26.0*  MCV 93.8   < > 91.7 92.6 89.0  --  86.1 87.3  --   PLT 429*   < > 430* 434* 433*  --  391 367  --    < > = values in this interval not displayed.    Basic Metabolic Panel: Recent Labs  Lab 03/17/23 0041 03/17/23 1124 03/18/23 0403 03/19/23 0054 03/20/23 0257  NA 135 133* 132* 135 130*  K 3.5 3.9 3.8 3.3* 3.7  CL 92* 91* 91* 95* 89*  CO2 28  26 25 26 24   GLUCOSE 107* 119* 99 81 91  BUN 29* 33* 43* 17 25*  CREATININE 4.47* 5.08* 6.28* 3.41* 4.98*  CALCIUM 9.9 10.1 9.8 9.6 9.8  MG 1.7  --   --   --   --   PHOS 4.8* 5.2* 5.2* 4.3 5.8*    GFR: Estimated Creatinine Clearance: 8.5 mL/min (A) (by C-G formula based on SCr of 4.98 mg/dL (H)). Liver Function Tests: Recent Labs  Lab 03/17/23 0041 03/17/23 1124 03/18/23 0403 03/19/23 0054 03/20/23 0257  ALBUMIN 2.0* 2.0* 1.9* 1.8* 1.8*     Coagulation Profile: No results for input(s): "INR", "PROTIME" in the last 168 hours.  Code Status:  Full code.  Code status decision has been confirmed with: patient  Family Communication: sisters    Severity of Illness:  The appropriate patient status for this patient is INPATIENT. Inpatient status is judged  to be reasonable and necessary in order to provide the required intensity of service to ensure the patient's safety. The patient's presenting symptoms, physical exam findings, and initial radiographic and laboratory data in the context of their chronic comorbidities is felt to place them at high risk for further clinical deterioration. Furthermore, it is not anticipated that the patient will be medically stable for discharge from the hospital within 2 midnights of admission.   * I certify that at the point of admission it is my clinical judgment that the patient will require inpatient hospital care spanning beyond 2 midnights from the point of admission due to high intensity of service, high risk for further deterioration and high frequency of surveillance required.*   Author:  Rolm Gala MD  03/21/2023 8:41 AM

## 2023-03-21 NOTE — Progress Notes (Signed)
Medium size bright red GI bleed occurrence MD notified

## 2023-03-21 NOTE — Progress Notes (Signed)
Physical Therapy Treatment Patient Details Name: Madison [name] MRN: [id number] DOB: 1951/01/24 Today's Date: 03/21/2023   History of Present Illness 72 y.o. female admitted 6/4 for fall and hip pain, s/p UNIPOLAR HEMIARTHROPLASTY OF THE LEFT HIP on 6/6. PMHx: history of ESRD, HTN, and schizophrenia versus bipolar disorder.    PT Comments    Pt was seen for mobility on bed, assisted to get in and out of brace for mobility but did not have alertness to functionally sit up on side of bed. Pt is actively resisting all movement of trunk, and struggling to allow PT to align her for better functional outcome.  Pt is appropriate for <3 hours a day rehab as planned, with focus on recovering strength and tolerance for OOB.  Pt is also continuing to have GI bleeding per nsg, and will monitor her Hgb and other labs for signs of medical stress.  Follow up as tolerated, encourage mobility as tolerated.  Recommendations for follow up therapy are one component of a multi-disciplinary discharge planning process, led by the attending physician.  Recommendations may be updated based on patient status, additional functional criteria and insurance authorization.  Follow Up Recommendations  Can patient physically be transported by private vehicle: No    Assistance Recommended at Discharge Intermittent Supervision/Assistance  Patient can return home with the following Two people to help with walking and/or transfers;Two people to help with bathing/dressing/bathroom;Assistance with cooking/housework;Assist for transportation;Help with stairs or ramp for entrance   Equipment Recommendations  None recommended by PT    Recommendations for Other Services       Precautions / Restrictions Precautions Precautions: Posterior Hip Precaution Booklet Issued: Yes (comment) Precaution Comments: instructions on bed but not attending to them Required Braces or Orthoses: Other Brace Knee Immobilizer - Left: Other  (comment) Other Brace: wedge cushion Restrictions Weight Bearing Restrictions: Yes LLE Weight Bearing: Weight bearing as tolerated     Mobility  Bed Mobility Overal bed mobility: Needs Assistance Bed Mobility:  (shifting to midline alignment)                Transfers                   General transfer comment: deferred. resisting movement and lethargic    Ambulation/Gait                   Stairs             Wheelchair Mobility    Modified Rankin (Stroke Patients Only)       Balance Overall balance assessment: Needs assistance                                          Cognition Arousal/Alertness: Lethargic Behavior During Therapy: Flat affect Overall Cognitive Status: History of cognitive impairments - at baseline                                 General Comments: quite lethargic, asked her several times to keep eyes open        Exercises General Exercises - Lower Extremity Ankle Circles/Pumps: AAROM, 10 reps Short Arc Quad: AAROM Heel Slides: AAROM, 10 reps Hip ABduction/ADduction: AAROM, 10 reps Straight Leg Raises: AAROM, 10 reps Hip Flexion/Marching: AAROM, 10 reps    General Comments General comments (skin integrity, edema,  etc.): Pt could not be assisted to side of bed due to lethargic presentation and resistive movement.  Pt was assisted out of brace and to move LE's but did not become more alert      Pertinent Vitals/Pain Pain Assessment Pain Assessment: Faces Faces Pain Scale: Hurts even more Pain Location: L hip with assisted ROM Pain Descriptors / Indicators: Guarding, Grimacing Pain Intervention(s): Limited activity within patient's tolerance, Premedicated before session, Repositioned    Home Living                          Prior Function            PT Goals (current goals can now be found in the care plan section) Acute Rehab PT Goals Patient Stated Goal: none  stated Progress towards PT goals: Not progressing toward goals - comment    Frequency    Min 2X/week      PT Plan Current plan remains appropriate    Co-evaluation              AM-PAC PT "6 Clicks" Mobility   Outcome Measure  Help needed turning from your back to your side while in a flat bed without using bedrails?: A Lot Help needed moving from lying on your back to sitting on the side of a flat bed without using bedrails?: Total Help needed moving to and from a bed to a chair (including a wheelchair)?: Total Help needed standing up from a chair using your arms (e.g., wheelchair or bedside chair)?: Total Help needed to walk in hospital room?: Total Help needed climbing 3-5 steps with a railing? : Total 6 Click Score: 7    End of Session   Activity Tolerance: Patient limited by lethargy;Patient limited by pain Patient left: in bed;with call bell/phone within reach;with bed alarm set Nurse Communication: Mobility status PT Visit Diagnosis: Muscle weakness (generalized) (M62.81);History of falling (Z91.81);Difficulty in walking, not elsewhere classified (R26.2);Pain Pain - Right/Left: Left Pain - part of body: Hip;Leg     Time: 8413-2440 PT Time Calculation (min) (ACUTE ONLY): 16 min  Charges:  $Therapeutic Exercise: 8-22 mins            Ivar Drape 03/21/2023, 9:48 PM  Samul Dada, PT PhD Acute Rehab Dept. Number: [facility] [id number] and [facility] [phone]

## 2023-03-22 DIAGNOSIS — N186 End stage renal disease: Secondary | ICD-10-CM | POA: Diagnosis not present

## 2023-03-22 DIAGNOSIS — Z992 Dependence on renal dialysis: Secondary | ICD-10-CM | POA: Diagnosis not present

## 2023-03-22 LAB — CBC
HCT: 22.9 % — ABNORMAL LOW (ref 36.0–46.0)
Hemoglobin: 7.1 g/dL — ABNORMAL LOW (ref 12.0–15.0)
MCH: 28.2 pg (ref 26.0–34.0)
MCHC: 31 g/dL (ref 30.0–36.0)
MCV: 90.9 fL (ref 80.0–100.0)
Platelets: 358 10*3/uL (ref 150–400)
RBC: 2.52 MIL/uL — ABNORMAL LOW (ref 3.87–5.11)
RDW: 18.2 % — ABNORMAL HIGH (ref 11.5–15.5)
WBC: 11.9 10*3/uL — ABNORMAL HIGH (ref 4.0–10.5)
nRBC: 0 % (ref 0.0–0.2)

## 2023-03-22 LAB — RENAL FUNCTION PANEL
Albumin: 1.8 g/dL — ABNORMAL LOW (ref 3.5–5.0)
Anion gap: 16 — ABNORMAL HIGH (ref 5–15)
BUN: 21 mg/dL (ref 8–23)
CO2: 24 mmol/L (ref 22–32)
Calcium: 9.7 mg/dL (ref 8.9–10.3)
Chloride: 94 mmol/L — ABNORMAL LOW (ref 98–111)
Creatinine, Ser: 4.47 mg/dL — ABNORMAL HIGH (ref 0.44–1.00)
GFR, Estimated: 10 mL/min — ABNORMAL LOW (ref >60)
Glucose, Bld: 92 mg/dL (ref 70–99)
Phosphorus: 5 mg/dL — ABNORMAL HIGH (ref 2.5–4.6)
Potassium: 3.6 mmol/L (ref 3.5–5.1)
Sodium: 134 mmol/L — ABNORMAL LOW (ref 135–145)

## 2023-03-22 LAB — HEMOGLOBIN AND HEMATOCRIT, BLOOD
HCT: 26.4 % — ABNORMAL LOW (ref 36.0–46.0)
Hemoglobin: 8.5 g/dL — ABNORMAL LOW (ref 12.0–15.0)

## 2023-03-22 LAB — VALPROIC ACID LEVEL: Valproic Acid Lvl: 18 ug/mL — ABNORMAL LOW (ref 50.0–100.0)

## 2023-03-22 MED ORDER — OXYCODONE HCL 5 MG PO TABS
5.0000 mg | ORAL_TABLET | ORAL | Status: DC | PRN
  Administered 2023-03-22: 5 mg via ORAL
  Filled 2023-03-22: qty 1

## 2023-03-22 MED ORDER — OXYCODONE HCL 5 MG PO TABS
5.0000 mg | ORAL_TABLET | ORAL | Status: DC | PRN
  Administered 2023-03-23 (×2): 5 mg via ORAL
  Administered 2023-03-24: 10 mg via ORAL
  Filled 2023-03-22: qty 2
  Filled 2023-03-22: qty 1
  Filled 2023-03-22: qty 2
  Filled 2023-03-22: qty 1

## 2023-03-22 NOTE — Progress Notes (Signed)
OT Cancellation Note  Patient Details Name: ENYAH NEDD MRN: [id number] DOB: [birth year]   Cancelled Treatment:    Reason Eval/Treat Not Completed: Patient at procedure or test/ unavailable (Pt at HD at this time, OT will continue to follow-up with patient as able.)  03/22/2023  AB, OTR/L  Acute Rehabilitation Services  Office: [phone]   Tristan Schroeder 03/22/2023, 9:41 AM

## 2023-03-22 NOTE — Progress Notes (Signed)
Patient had a medium sized brown red, blood clots mixed BM

## 2023-03-22 NOTE — Progress Notes (Signed)
   03/22/23 1611  Vitals  BP 108/74  MAP (mmHg) 84  Pulse Rate (!) 126  ECG Heart Rate (!) 125  Resp (!) 25  Oxygen Therapy  SpO2 97 %  MEWS Score  MEWS Temp 0  MEWS Systolic 0  MEWS Pulse 2  MEWS RR 1  MEWS LOC 2  MEWS Score 5  MEWS Score Color Red     Dr. Allena Katz at bedside to evaluate patient. See new orders.

## 2023-03-22 NOTE — Progress Notes (Signed)
Contacted Kim with [facility] out-pt HD. Discussed pt's need for stretcher HD and that pt's sister sits with pt for HD treatments. Kim to inquire if any of the [facility] clinics that do stretcher HD have any availability. Kim to return call to navigator. Spoke to inpt HD unit staff this am and advised pt currently in the bed for HD. Update provided to CSW. Will assist as needed.   Olivia Canter Renal Navigator [phone]

## 2023-03-22 NOTE — Plan of Care (Signed)
  Problem: Clinical Measurements: Goal: Will remain free from infection Outcome: Progressing Goal: Diagnostic test results will improve Outcome: Progressing Goal: Respiratory complications will improve Outcome: Progressing Goal: Cardiovascular complication will be avoided Outcome: Progressing   Problem: Elimination: Goal: Will not experience complications related to bowel motility Outcome: Progressing Goal: Will not experience complications related to urinary retention Outcome: Progressing   Problem: Safety: Goal: Ability to remain free from injury will improve Outcome: Progressing   Problem: Skin Integrity: Goal: Risk for impaired skin integrity will decrease Outcome: Progressing   Problem: Clinical Measurements: Goal: Postoperative complications will be avoided or minimized Outcome: Progressing   Problem: Pain Management: Goal: Pain level will decrease Outcome: Progressing

## 2023-03-22 NOTE — Procedures (Signed)
HD Note:  Some information was entered later than the data was gathered due to patient care needs. The stated time with the data is accurate.  Received patient in bed to unit.  Patient only response is to pain, responded with eyes closed and moan when cannulation occurred.   Informed consent signed and in chart.   TX duration: 3.5 hours  Patient tolerated well. Machine did clot approximately half way through treatment.  It was set back up.  Patient rested with eyes closed during the treatment.    Transported back to the room  Alert, without acute distress.  Hand-off given to patient's nurse.   Access used: Left upper arm Access issues: none  Total UF removed: 1400 ml      [facility]

## 2023-03-22 NOTE — Progress Notes (Signed)
Occupational Therapy Treatment Patient Details Name: Madison [name] MRN: [id number] DOB: 01/20/1951 Today's Date: 03/22/2023   History of present illness 72 y.o. female admitted 6/4 for fall and hip pain, s/p UNIPOLAR HEMIARTHROPLASTY OF THE LEFT HIP on 6/6. PMHx: history of ESRD, HTN, and schizophrenia versus bipolar disorder.   OT comments  Pt continues to be limited by impaired cognition and very anxious status. Pt not following commands and is alert during session but not following along with prompts or engage in conversation. Pt HR more elevated than normal upon arrival and increases with mobility, pt returned to bed to perform EKG. Pt not understanding precautions, remains limited by pain, needs total A for any mobility. OT to continue to progress pt as able, DC plans remain appropriate for SNF.   Recommendations for follow up therapy are one component of a multi-disciplinary discharge planning process, led by the attending physician.  Recommendations may be updated based on patient status, additional functional criteria and insurance authorization.    Assistance Recommended at Discharge Frequent or constant Supervision/Assistance  Patient can return home with the following  Two people to help with walking and/or transfers;Direct supervision/assist for financial management;Two people to help with bathing/dressing/bathroom;Direct supervision/assist for medications management;Assistance with cooking/housework;Assist for transportation;Help with stairs or ramp for entrance;Assistance with feeding   Equipment Recommendations  Other (comment) (TBD at next level of care)    Recommendations for Other Services      Precautions / Restrictions Precautions Precautions: Posterior Hip Precaution Booklet Issued: Yes (comment) Precaution Comments: instructions on wall Required Braces or Orthoses: Other Brace Knee Immobilizer - Left: Other (comment) Other Brace: wedge cushion Restrictions Weight  Bearing Restrictions: Yes LLE Weight Bearing: Weight bearing as tolerated       Mobility Bed Mobility Overal bed mobility: Needs Assistance Bed Mobility: Supine to Sit, Sit to Supine     Supine to sit: Total assist Sit to supine: Total assist, +2 for physical assistance   General bed mobility comments: pt not initiating BLE movement, abduction wedge left in place to maintain hip precautions.    Transfers Overall transfer level: Needs assistance Equipment used: None Transfers: Bed to chair/wheelchair/BSC   Stand pivot transfers: Total assist         General transfer comment: Stand pivot bed<>recliner.     Balance Overall balance assessment: Needs assistance Sitting-balance support: Feet supported Sitting balance-Leahy Scale: Poor   Postural control: Right lateral lean   Standing balance-Leahy Scale: Zero Standing balance comment: reliant on ext support                           ADL either performed or assessed with clinical judgement   ADL                                         General ADL Comments: No change in bADL status at this time, pt somewhat resistive to mobility and anxious. Not initaiting much movements on her own.    Extremity/Trunk Assessment              Vision       Perception     Praxis      Cognition Arousal/Alertness: Lethargic Behavior During Therapy: Flat affect, Anxious Overall Cognitive Status: History of cognitive impairments - at baseline  General Comments: pt barely mumbling any words, very anxious and shaking during session.        Exercises      Shoulder Instructions       General Comments Pt HR resting at 123 following return from HD, up to 143 bpm with chair t/f. Pt returned to bed so MD and RN can perform EKG. Wedge cushion in place during session    Pertinent Vitals/ Pain       Pain Assessment Pain Assessment: Faces Faces Pain  Scale: Hurts even more Pain Location: L hip Pain Descriptors / Indicators: Guarding, Grimacing, Aching, Moaning Pain Intervention(s): Repositioned, Monitored during session, Limited activity within patient's tolerance  Home Living                                          Prior Functioning/Environment              Frequency  Min 1X/week        Progress Toward Goals  OT Goals(current goals can now be found in the care plan section)  Progress towards OT goals: Not progressing toward goals - comment (pt remains limited, not following commands)  Acute Rehab OT Goals OT Goal Formulation: Patient unable to participate in goal setting Time For Goal Achievement: 04/02/23 Potential to Achieve Goals: Fair  Plan Discharge plan remains appropriate;Frequency remains appropriate    Co-evaluation                 AM-PAC OT "6 Clicks" Daily Activity     Outcome Measure   Help from another person eating meals?: A Lot Help from another person taking care of personal grooming?: A Lot Help from another person toileting, which includes using toliet, bedpan, or urinal?: A Lot Help from another person bathing (including washing, rinsing, drying)?: A Lot Help from another person to put on and taking off regular upper body clothing?: A Lot Help from another person to put on and taking off regular lower body clothing?: Total 6 Click Score: 11    End of Session    OT Visit Diagnosis: Muscle weakness (generalized) (M62.81);Other symptoms and signs involving cognitive function;Pain Pain - Right/Left: Left Pain - part of body: Hip   Activity Tolerance Patient limited by pain;Treatment limited secondary to medical complications (Comment)   Patient Left in bed;with call bell/phone within reach;with nursing/sitter in room   Nurse Communication Need for lift equipment        Time: 1543-1611 OT Time Calculation (min): 28 min  Charges: OT General Charges $OT  Visit: 1 Visit OT Treatments $Therapeutic Activity: 23-37 mins  03/22/2023  AB, OTR/L  Acute Rehabilitation Services  Office: [phone]   Tristan Schroeder 03/22/2023, 4:40 PM

## 2023-03-22 NOTE — Progress Notes (Signed)
   03/22/23 0601  Assess: MEWS Score  Temp 97.9 F (36.6 C)  BP (!) 113/54  MAP (mmHg) 73  Pulse Rate (!) 101  ECG Heart Rate 100  Resp (!) 46  Level of Consciousness Alert  SpO2 100 %  O2 Device Room Air  Patient Activity (if Appropriate) In bed  Assess: MEWS Score  MEWS Temp 0  MEWS Systolic 0  MEWS Pulse 0  MEWS RR 3  MEWS LOC 0  MEWS Score 3  MEWS Score Color Yellow  Assess: if the MEWS score is Yellow or Red  Were vital signs taken at a resting state? Yes  Focused Assessment Change from prior assessment (see assessment flowsheet)  Does the patient meet 2 or more of the SIRS criteria? No  Does the patient have a confirmed or suspected source of infection? Yes  MEWS guidelines implemented  Yes, yellow  Treat  MEWS Interventions Considered administering scheduled or prn medications/treatments as ordered  Take Vital Signs  Increase Vital Sign Frequency  Yellow: Q2hr x1, continue Q4hrs until patient remains green for 12hrs  Escalate  MEWS: Escalate Yellow: Discuss with charge nurse and consider notifying provider and/or RRT  Notify: Charge Nurse/RN  Name of Charge Nurse/RN Notified [place], RN  Provider Notification  Provider Name/Title Anthoney Harada, Np  Date Provider Notified 03/22/23  Time Provider Notified [phone]  Method of Notification Page  Notification Reason Change in status  Date of Provider Response 03/22/23  Time of Provider Response [phone]  Assess: SIRS CRITERIA  SIRS Temperature  0  SIRS Pulse 1  SIRS Respirations  1  SIRS WBC 0  SIRS Score Sum  2

## 2023-03-22 NOTE — Plan of Care (Signed)
  Problem: Activity: Goal: Risk for activity intolerance will decrease Outcome: Progressing   Problem: Nutrition: Goal: Adequate nutrition will be maintained Outcome: Progressing   Problem: Safety: Goal: Ability to remain free from injury will improve Outcome: Progressing   Problem: Skin Integrity: Goal: Risk for impaired skin integrity will decrease Outcome: Progressing   

## 2023-03-22 NOTE — Progress Notes (Signed)
Palliative:  HPI:  72 y.o. female  with past medical history of ESRD, schizoaffective disorder bipolar type, anxiety, neuropathy admitted on 03/08/2023 with fall at facility with left hip fracture s/p surgical repair 6/6. Hospitalization complicated by issues with dialysis catheter, agitation and behavioral disturbance, and inability to sit in chair for dialysis, rectal bleeding likely secondary to stercoral colitis.   I met today with Madison [name] directly after her return to room from dialysis. Madison [name] is extremely lethargic and does not awaken to interact with me. RN at bedside reports that she was the same this morning prior to dialysis. She has not been awake, alert enough to eat/drink breakfast or lunch. I will cut back on her oxycodone 5-10 mg vs oxycodone 10 mg although I do not feel this lethargy is all related to pain medication she received at 6 am.   I called and spoke with sister, Madison [name]. I reviewed my assessment and Madison [name]'s status with her. Noted that there was concern for tachypnea this morning so she was not placed in chair for dialysis today. Madison [name] expresses understanding. She tells me that Madison [name]'s favorite cousin came to visit yesterday after she visited but she has not heard yet how their visit went. Continued time for outcomes. Madison [name] did call earlier and requested chaplain consultation based on Madison [name]'s discussion about her faith yesterday. I explained that I have requested chaplain visit but Madison [name] was too sleepy to interact with them at the time.   All questions/concerns addressed. Emotional support provided.   Exam: Somnolent. Sleeping comfortably. Tachycardic. Breathing regular, unlabored. Abd soft. Warm to touch.   Plan: - DNR - No feeding tube - Hopeful to continue dialysis - Time for outcomes  50 min  Yong Channel, NP Palliative Medicine Team Pager [phone] (Please see [email or web address] for schedule) Team Phone [phone]    Greater than 50%  of this time was  spent counseling and coordinating care related to the above assessment and plan

## 2023-03-22 NOTE — Progress Notes (Signed)
PROGRESS NOTE   Madison [name]  WUJ:[id number] DOB: August 02, 1951 DOA: 03/01/2023 PCP: Charlynne Pander, MD   Date of Service: the patient was seen and examined on 03/22/2023  Brief Narrative:   The patient is a 72 year old SNF resident with past medical history significant for but limited to ESRD on hemodialysis Monday Wednesday Friday, hypertension, history of schizophrenia versus bipolar disorder as well as other comorbidities who presented to the [facility] ER after a fall which occurred multiple days prior.  X-rays at [facility] ER revealed a left femoral fracture and she was transferred to [facility] given that she needed a combination of the orthopedist as well as inpatient dialysis.  She underwent unipolar Hemi arthroplasty of the left hip on 03/19/2023 and has been deemed stable from a orthopedic standpoint.  She had issues with her dialysis catheter which has not been fixed and she is getting dialysis.  She was extremely agitated and hysterically crying early on during the hospitalization and remained emotionally labile and tearful however was calmer.   We were anticipating discharging back to SNF with resumption of her normal dialysis session on Friday, 03/08/2023 however a barrier to discharge was that the patient will need to be able to tolerate sitting in a chair for 4 to 5 hours to be appropriate for outpatient HD for discharge.  She was able to dialyze in the chair early on during the hospitalization, appropriately and had no issues with that but heart rate remained elevated in the setting of pain and agitation so she was initiated on a beta-blocker and given some pain medication.    Heart rate was improved however she now has a slight leukocytosis and after repeat is trending down.  TOC attempted to call the facility and got an response so unfortunately could not discharge given that no one at the bedside picked up.  Case worker is informed that the patient's legal guardian was unhappy with the SNF  choice and requested to change her SNF placement to [facility].  [facility] is able to take the patient at their facility  however because of the symptoms changed her dialysis center will need to be moved.     CBC continue to further worsen as she had bright red blood per rectum and was found to be impacted.  Having large stool ball and her bowel regimen has been escalated and obtained CT scan.  GI was consulted given her hemoglobin and bleeding and has bleeding noted to be subsiding, GI not recommending endoscopic procedures at this time.  GI feels patient bleeding likely secondary to inflammation in the rectum due to stercoral colitis, and the meds recommended for patient's constipation a few no further additional bleeding at this time..  Will need to continue monitor carefully.  Assessment and Plan:  Left femoral neck fracture 2/2 fall  S/p unipolar hemiarthroplasty of the left hip 03/14/2023 without any significant complications. -Continue current pain regimen of oxycodone 5 to 10 mg every 4 hours as needed moderate pain, acetaminophen 650 mg 3 times daily, fentanyl 12.5 mg every 2 hours as needed for severe pain. -Orthopedics recommending WBAT left lower extremity with assistance and posterior hip precautions for 12 weeks with daily wound care as needed and DVT prophylaxis with subcu heparin x 28 days. -Continue current bowel regimen. -Outpatient follow-up with orthopedic surgery 10 days postdischarge.   Schizoaffective disorder/bipolar type with uncontrollable crying and agitation intermittently/acute metabolic encephalopathy Patient noted to have been extremely agitated and anxious a few days ago  however noted to be drowsy on 03/13/2023, alert 03/14/2023 however tearful and noted to have intermittent agitation and confusion.    -Psychiatry consulted for uncontrollable crying and intermittent agitation. Has seen pt multiple times during admission: Valproic acid this morning 18, convert to  sprinkles/pills -Continue home regimen Abilify, Depakote, Cogentin and mirtazapine. -Haldol as needed.  ESRD on HD MWF Pt was last able to sit up in chair for dialysis 1 month ago. Will continue to work towards this goal. -Nephrology following: patient needs to be dialyzed in the chair prior to being discharged and needs improved mental status prior to being set up for outpatient dialysis so she is able to communicate his needs during hemodialysis.   -Continue sevelamer 1600 mg 3 times daily -Avoid nephrotoxic medications, contrast dyes, hypotension and dehydration to ensure adequate renal perfusion --Continue midodrine 10 mg 3 times  Constipation and fecal impaction CT abdomen and pelvis done with large amount of stool in the rectum, wall thickening in the rectum perirectal stranding, findings suggesting fecal impaction and possible proctitis. No evidence of intestinal obstruction or pneumoperitoneum, no hydronephrosis. -Continue current bowel regimen of MiraLAX twice daily, Senokot-S twice daily. -Patient seen by GI and underwent flexible sigmoidoscopy 03/20/2023 which showed large amount of solid stool in the rectum, partially cleared, 15 mm polyp in the rectum biopsied, multiple ulcers in the rectum biopsied. GI recommended: bowel regimen and enemas for further stool evacuation and to avoid constipation as this will worsen stercoral ulcers, outpatient follow-up wth [facility] for consideration of EGD/colonoscopy for further evaluation of anemia and removal of colon polyps in the future once patient has recovered from her recent hip fracture.  -Continue to monitor bowel movements   Community-acquired pneumonia, resolved Patient afebrile overnight, WBC 11.9, improved improved from 14.3 yesterday CT scan done showed small new patchy infiltrates in both lower lower lung fields suggest atelectasis/pneumonia.  Small bilateral pleural effusions more on the left.  Small pericardial effusion, coronary  artery disease. Repeat CXR on 03/13/2023  lower lung volumes with increasing patchy left basilar airspace disease, suspicious for pneumonia.  Small bilateral effusions. Zosyn on 03/14/2023-03/21/2023 -Continue to monitor  Anemia of chronic kidney disease/postop anemia/BRBPR and concern for GI bleed (Likely stercoral colitis) Hb 7.1 this morning, from 7.9 yesterday. Patient with no significant further bleeding. S/p 2 units PRBCs during hemodialysis today 03/18/2023. -Hb transfusion threshold hemoglobin < 7 -Continue monitor with CBC -GI consulted and patient underwent flexible sigmoidoscopy 03/22/2023 which showed large amount of solid stool in the rectum, partially cleared, 15 mm polyp in the rectum biopsied, multiple ulcers in the rectum biopsied. -Post flex sigmoidoscopy GI recommending ongoing bowel regimen and enemas for further stool evacuation and to avoid constipation as this will worsen stercoral ulcers, outpatient follow-up with [facility] for consideration of EGD/colonoscopy for further evaluation of anemia and removal of colon polyps in the future once patient has recovered from her recent hip fracture. -Continue Darbepoetin alfa 60 mcg q. Mondays.  Nonsevere protein calorie malnutrition, moderate Secondary to chronic illnesses (ESRD, schizoaffective disorder) as evidenced by moderate fat depletion depletion. Pt feeds better with family at bedside. -Assistance with meals  -Dietitian following, after discussion with family we have agreed no to a cotrak -Palliative care consulted and following.    GERD -Continue PPI.    Sinus tachycardia Noted in the setting of GI bleed constipation.  -Holding beta-blocker in the setting of soft blood pressure.    Hypoalbuminemia Chronic, stable    Subjective:  I saw patient  when she returned from dialysis.  No family at bedside.  Physical Exam:  Vitals:   03/21/23 1939 03/22/23 0601 03/22/23 0649 03/22/23 0700  BP: (!) 105/58 (!) 113/54  (!)  127/54  Pulse: (!) 102 (!) 101    Resp: 20 (!) 46    Temp: 98.7 F (37.1 C) 97.9 F (36.6 C)  97.9 F (36.6 C)  TempSrc: Oral Oral  Oral  SpO2: 98% 100%    Weight:   54.8 kg   Height:         General: frail appearing 72 yr old female, shaking Cardio: Normal S1 and S2, RRR, no r/m/g Pulm: CTAB, normal work of breathing Abdomen: Bowel sounds normal. Abdomen soft and non-tender.  Extremities: No peripheral edema.  Neuro: Cranial nerves grossly intact    Data Reviewed:  I have personally reviewed and interpreted labs, imaging.   CBC: Recent Labs  Lab 02/25/2023 1030 03/16/23 0211 03/18/23 0403 03/18/23 1925 03/19/23 0054 03/20/23 0257 03/21/23 0000 03/21/23 0901 03/22/23 0352  WBC 11.2*   < > 14.2*  --  12.6* 15.2*  --  14.3* 11.9*  NEUTROABS 8.8*  --   --   --   --   --   --   --   --   HGB 8.2*   < > 6.7*   < > 8.0* 8.0* 8.5* 7.9* 7.1*  HCT 27.2*   < > 21.0*   < > 24.1* 24.1* 26.0* 24.2* 22.9*  MCV 93.8   < > 89.0  --  86.1 87.3  --  90.6 90.9  PLT 429*   < > 433*  --  391 367  --  396 358   < > = values in this interval not displayed.    Basic Metabolic Panel: Recent Labs  Lab 03/17/23 0041 03/17/23 1124 03/18/23 0403 03/19/23 0054 03/20/23 0257 03/21/23 0901 03/22/23 0352  NA 135   < > 132* 135 130* 134* 134*  K 3.5   < > 3.8 3.3* 3.7 3.6 3.6  CL 92*   < > 91* 95* 89* 93* 94*  CO2 28   < > 25 26 24 25 24   GLUCOSE 107*   < > 99 81 91 80 92  BUN 29*   < > 43* 17 25* 12 21  CREATININE 4.47*   < > 6.28* 3.41* 4.98* 3.32* 4.47*  CALCIUM 9.9   < > 9.8 9.6 9.8 9.6 9.7  MG 1.7  --   --   --   --   --   --   PHOS 4.8*   < > 5.2* 4.3 5.8* 4.7* 5.0*   < > = values in this interval not displayed.    GFR: Estimated Creatinine Clearance: 9.7 mL/min (A) (by C-G formula based on SCr of 4.47 mg/dL (H)). Liver Function Tests: Recent Labs  Lab 03/18/23 0403 03/19/23 0054 03/20/23 0257 03/21/23 0901 03/22/23 0352  ALBUMIN 1.9* 1.8* 1.8* 1.9* 1.8*      Coagulation Profile: No results for input(s): "INR", "PROTIME" in the last 168 hours.  Code Status:  Full code.  Code status decision has been confirmed with: patient  Family Communication: sisters    Severity of Illness:  The appropriate patient status for this patient is INPATIENT. Inpatient status is judged to be reasonable and necessary in order to provide the required intensity of service to ensure the patient's safety. The patient's presenting symptoms, physical exam findings, and initial radiographic and laboratory data in the context  of their chronic comorbidities is felt to place them at high risk for further clinical deterioration. Furthermore, it is not anticipated that the patient will be medically stable for discharge from the hospital within 2 midnights of admission.   * I certify that at the point of admission it is my clinical judgment that the patient will require inpatient hospital care spanning beyond 2 midnights from the point of admission due to high intensity of service, high risk for further deterioration and high frequency of surveillance required.*   Author:  Rolm Gala MD  03/22/2023 8:15 AM

## 2023-03-22 NOTE — TOC Progression Note (Signed)
Transition of Care [facility]) - Progression Note    Patient Details  Name: Madison [name] MRN: [id number] Date of Birth: May 28, 1951  Transition of [facility]) CM/SW Contact  Lorri Frederick, LCSW Phone Number: 03/22/2023, 8:19 AM  Clinical Narrative:   Message from [facility] in [place].  They have received referral for this pt in the recent past as well, they are not able to accept a long term care pt who requires HD.      Expected Discharge Plan: Skilled Nursing Facility Barriers to Discharge: Continued Medical Work up, SNF Pending bed offer  Expected Discharge Plan and Services In-house Referral: Clinical Social Work   Post Acute Care Choice: Skilled Nursing Facility Living arrangements for the past 2 months: Skilled Nursing Facility ([facility] Long term care)                                       Social Determinants of Health (SDOH) Interventions SDOH Screenings   Housing: Patient Unable To Answer (03/16/2023)  Alcohol Screen: Low Risk  (12/25/2018)  Tobacco Use: Low Risk  (03/19/2023)    Readmission Risk Interventions    12/27/2022    8:27 AM  Readmission Risk Prevention Plan  Transportation Screening Complete  HRI or Home Care Consult Complete  Social Work Consult for Recovery Care Planning/Counseling Complete  Palliative Care Screening Not Applicable  Medication Review [employer]) Complete

## 2023-03-22 NOTE — Progress Notes (Addendum)
Seen patient at bedside she returned from dialysis.  She appeared to be at her cognitive baseline however MEWs 6.  No active bleeding on exam. Today's Vitals   03/22/23 1400 03/22/23 1451 03/22/23 1600 03/22/23 1611  BP:  (!) 112/59 106/64 108/74  Pulse:  (!) 110 (!) 121 (!) 126  Resp:  (!) 22 (!) 27 (!) 25  Temp:  99.6 F (37.6 C) 98.8 F (37.1 C)   TempSrc:  Oral Axillary   SpO2:  100% 97% 97%  Weight:      Height:      PainSc: Asleep      Body mass index is 20.74 kg/m.  STAT EKG showed sinus tachycardia.  Unclear cause.  No signs of new infection.  Patient afebrile.  Discussed with Dr. Thedore Mins, nephrology who recommended that they drained more fluid than usual today.  Recommended stat H&H, if above 7 can give to 250 cc bolus.  If below 7 will need to transfuse RBC.  Explained plan to charge RN.  Explained that she should call rapid response if patient becomes unstable.  Towanda Octave MD  [facility]

## 2023-03-22 NOTE — Progress Notes (Signed)
[facility] Progress Note    Assessment/ Plan:   s/p hemiarthroplasty for left hip fracture: plan for SNF, rehab.    ESRD: On HD per MWF schedule (previously [facility] in [place]). -HD on MWF schedule, will continue to try for HD in chair   Anemia CKD: Very high ferritin level.  Continue Aranesp and monitor hemoglobin.  Transfuse as needed for hgb <7.   CKD-MBD: hyperphosphatemia - on sevelamer.  Will need a reduced ca bath for now if able (unfortunately we are limited inpatient).  Phosphorus level stable 5.0   Hypertension: Blood pressure low, on midodrine 10mg  TID. UF'ing as tolerated with HD   Bright red blood per rectum, proctitis, fecal impaction - per primary service. S/p flex sig 6/21-multiple rectal ulcers, large amount of solid stool in rectum   # Disposition - she is changing SNF's and will need a new outpatient dialysis unit.  (She is not able to be discharged without a new outpatient HD unit confirmed).  Note that at this time she is not acceptable for outpatient HD. She needs to be able to communicate her needs in the outpatient dialysis setting and able to sit on recliner or chair for HD. Of note, apparently sister sits with patient throughout all dialysis treatments chronically. Confirmed with sister at bedside on 6/25 6/27: did not tolerate HD in chair on 6/26. This makes her dispo more difficult. Maybe could consider LTAC or a facility that can accommodate HD in stretcher? 6/28: same issue, was deemed to be too unsafe to get in recliner by floor staff Agree with palliative care consult  Subjective:   Patient seen and examined on dialysis. Unable to get in chair again today, there was a concern for her RR going up but okay here. Otherwise tolerating HD, increasing UFG to 1.5L   Objective:   BP (!) 126/53   Pulse (!) 102   Temp 98.1 F (36.7 C)   Resp (!) 22   Ht 5\' 4"  (1.626 m)   Wt 54.8 kg   SpO2 100%   BMI 20.74 kg/m    Intake/Output Summary (Last 24 hours) at 03/22/2023 1121 Last data filed at 03/21/2023 1804 Gross per 24 hour  Intake 210 ml  Output 0 ml  Net 210 ml   Weight change: -0.1 kg  Physical Exam: Gen: chronically ill appearing, in bed CVS: RRR Resp: cta b/l, normal WOB Abd: soft, nt/nd Ext: no edema Neuro: awake Dialysis access: LUE AVF + b/t  Imaging: No results found.  Labs: BMET Recent Labs  Lab 03/17/23 0041 03/17/23 1124 03/18/23 0403 03/19/23 0054 03/20/23 0257 03/21/23 0901 03/22/23 0352  NA 135 133* 132* 135 130* 134* 134*  K 3.5 3.9 3.8 3.3* 3.7 3.6 3.6  CL 92* 91* 91* 95* 89* 93* 94*  CO2 28 26 25 26 24 25 24   GLUCOSE 107* 119* 99 81 91 80 92  BUN 29* 33* 43* 17 25* 12 21  CREATININE 4.47* 5.08* 6.28* 3.41* 4.98* 3.32* 4.47*  CALCIUM 9.9 10.1 9.8 9.6 9.8 9.6 9.7  PHOS 4.8* 5.2* 5.2* 4.3 5.8* 4.7* 5.0*   CBC Recent Labs  Lab 03/19/23 0054 03/20/23 0257 03/21/23 0000 03/21/23 0901 03/22/23 0352  WBC 12.6* 15.2*  --  14.3* 11.9*  HGB 8.0* 8.0* 8.5* 7.9* 7.1*  HCT 24.1* 24.1* 26.0* 24.2* 22.9*  MCV 86.1 87.3  --  90.6 90.9  PLT 391 367  --  396 358    Medications:  acetaminophen  1,000 mg Oral TID   ARIPiprazole  15 mg Oral Daily   benztropine  0.25 mg Oral BID   bisacodyl  10 mg Rectal Daily   Chlorhexidine Gluconate Cloth  6 each Topical Q0600   darbepoetin (ARANESP) injection - NON-DIALYSIS  100 mcg Subcutaneous Q Wed-1800   docusate  100 mg Oral BID   feeding supplement  237 mL Oral BID BM   midodrine  10 mg Oral TID WC   mirtazapine  15 mg Oral QHS   multivitamin  1 tablet Oral QHS   mouth rinse  15 mL Mouth Rinse 4 times per day   pantoprazole (PROTONIX) IV  40 mg Intravenous Q12H   polyethylene glycol  17 g Oral BID   senna-docusate  1 tablet Oral BID   sevelamer carbonate  1,600 mg Oral TID WC      Anthony Sar, MD [facility] 03/22/2023, 11:21 AM

## 2023-03-23 DIAGNOSIS — N186 End stage renal disease: Secondary | ICD-10-CM | POA: Diagnosis not present

## 2023-03-23 DIAGNOSIS — Z992 Dependence on renal dialysis: Secondary | ICD-10-CM | POA: Diagnosis not present

## 2023-03-23 LAB — CBC
HCT: 26.4 % — ABNORMAL LOW (ref 36.0–46.0)
Hemoglobin: 8.3 g/dL — ABNORMAL LOW (ref 12.0–15.0)
MCH: 29.3 pg (ref 26.0–34.0)
MCHC: 31.4 g/dL (ref 30.0–36.0)
MCV: 93.3 fL (ref 80.0–100.0)
Platelets: 419 10*3/uL — ABNORMAL HIGH (ref 150–400)
RBC: 2.83 MIL/uL — ABNORMAL LOW (ref 3.87–5.11)
RDW: 18.2 % — ABNORMAL HIGH (ref 11.5–15.5)
WBC: 17.4 10*3/uL — ABNORMAL HIGH (ref 4.0–10.5)
nRBC: 0 % (ref 0.0–0.2)

## 2023-03-23 LAB — RENAL FUNCTION PANEL
Albumin: 2.1 g/dL — ABNORMAL LOW (ref 3.5–5.0)
Anion gap: 16 — ABNORMAL HIGH (ref 5–15)
BUN: 15 mg/dL (ref 8–23)
CO2: 26 mmol/L (ref 22–32)
Calcium: 9.9 mg/dL (ref 8.9–10.3)
Chloride: 92 mmol/L — ABNORMAL LOW (ref 98–111)
Creatinine, Ser: 3.1 mg/dL — ABNORMAL HIGH (ref 0.44–1.00)
GFR, Estimated: 15 mL/min — ABNORMAL LOW (ref >60)
Glucose, Bld: 99 mg/dL (ref 70–99)
Phosphorus: 4.3 mg/dL (ref 2.5–4.6)
Potassium: 3.9 mmol/L (ref 3.5–5.1)
Sodium: 134 mmol/L — ABNORMAL LOW (ref 135–145)

## 2023-03-23 MED ORDER — VALPROIC ACID 250 MG PO CAPS
250.0000 mg | ORAL_CAPSULE | Freq: Two times a day (BID) | ORAL | Status: DC
  Filled 2023-03-23 (×2): qty 1

## 2023-03-23 MED ORDER — SEVELAMER CARBONATE 0.8 G PO PACK
1.6000 g | PACK | Freq: Three times a day (TID) | ORAL | Status: DC
  Filled 2023-03-23 (×4): qty 2

## 2023-03-23 MED ORDER — VALPROIC ACID 250 MG/5ML PO SOLN
250.0000 mg | Freq: Two times a day (BID) | ORAL | Status: DC
  Administered 2023-03-23: 250 mg via ORAL
  Filled 2023-03-23 (×3): qty 5

## 2023-03-23 MED ORDER — VALPROIC ACID 250 MG/5ML PO SOLN: 250.0000 mg | Freq: Two times a day (BID) | ORAL | Status: DC

## 2023-03-23 MED ORDER — SODIUM CHLORIDE 0.9 % IV BOLUS
250.0000 mL | Freq: Once | INTRAVENOUS | Status: AC
  Administered 2023-03-23: 250 mL via INTRAVENOUS

## 2023-03-23 MED ORDER — SODIUM CHLORIDE 0.9 % IV SOLN: INTRAVENOUS | Status: DC | PRN

## 2023-03-23 NOTE — Plan of Care (Signed)
  Problem: Elimination: Goal: Will not experience complications related to bowel motility Outcome: Progressing   Problem: Coping: Goal: Level of anxiety will decrease Outcome: Progressing   Problem: Nutrition: Goal: Adequate nutrition will be maintained Outcome: Progressing

## 2023-03-23 NOTE — Progress Notes (Signed)
[facility] Progress Note   Subjective: Seen in room crying because IV Pump is beeping. Verbal response unintelligible. HD 06/28 net UF 1 liter.   Objective Vitals:   03/23/23 0300 03/23/23 0437 03/23/23 0643 03/23/23 0730  BP: (!) 115/52 (!) 116/48  (!) 143/65  Pulse: 97 88  (!) 108  Resp: (!) 24 18  17   Temp:    99.2 F (37.3 C)  TempSrc:    Oral  SpO2: 100% 94%  98%  Weight:   51.8 kg   Height:       Physical Exam General: chronically ill appearing female, agitated at present. Heart: Tachy S1,S2. No M/R/G. ST on monitor rate 100-110 Lungs: CTAB A/P Abdomen: Soft, NABS Extremities: Abduction pillow between legs. No LE edema.  Dialysis Access: L AVF + T/B   Additional Objective Labs: Basic Metabolic Panel: Recent Labs  Lab 03/21/23 0901 03/22/23 0352 03/23/23 0338  NA 134* 134* 134*  K 3.6 3.6 3.9  CL 93* 94* 92*  CO2 25 24 26   GLUCOSE 80 92 99  BUN 12 21 15   CREATININE 3.32* 4.47* 3.10*  CALCIUM 9.6 9.7 9.9  PHOS 4.7* 5.0* 4.3   Liver Function Tests: Recent Labs  Lab 03/21/23 0901 03/22/23 0352 03/23/23 0338  ALBUMIN 1.9* 1.8* 2.1*   No results for input(s): "LIPASE", "AMYLASE" in the last 168 hours. CBC: Recent Labs  Lab 03/19/23 0054 03/20/23 0257 03/21/23 0000 03/21/23 0901 03/22/23 0352 03/22/23 1629 03/23/23 0338  WBC 12.6* 15.2*  --  14.3* 11.9*  --  17.4*  HGB 8.0* 8.0*   < > 7.9* 7.1* 8.5* 8.3*  HCT 24.1* 24.1*   < > 24.2* 22.9* 26.4* 26.4*  MCV 86.1 87.3  --  90.6 90.9  --  93.3  PLT 391 367  --  396 358  --  419*   < > = values in this interval not displayed.   Blood Culture    Component Value Date/Time   SDES BLOOD SITE NOT SPECIFIED 03/10/2023 0951   SDES BLOOD SITE NOT SPECIFIED 03/10/2023 0951   SPECREQUEST  03/10/2023 0951    BOTTLES DRAWN AEROBIC AND ANAEROBIC Blood Culture results may not be optimal due to an inadequate volume of blood received in culture bottles   SPECREQUEST  03/10/2023 0951     AEROBIC BOTTLE ONLY Blood Culture results may not be optimal due to an inadequate volume of blood received in culture bottles   CULT  03/10/2023 0951    NO GROWTH 5 DAYS Performed at [facility], [address], [place], [place] [address]    CULT  03/10/2023 [phone]    NO GROWTH 5 DAYS Performed at [facility], [address], [place], [place] [address]    REPTSTATUS 03/06/2023 FINAL 03/10/2023 0951   REPTSTATUS 03/10/2023 FINAL 03/10/2023 0951    Cardiac Enzymes: No results for input(s): "CKTOTAL", "CKMB", "CKMBINDEX", "TROPONINI" in the last 168 hours. CBG: No results for input(s): "GLUCAP" in the last 168 hours. Iron Studies: No results for input(s): "IRON", "TIBC", "TRANSFERRIN", "FERRITIN" in the last 72 hours. @lablastinr3 @ Studies/Results: No results found. Medications:  valproate sodium Stopped (03/22/23 2335)    acetaminophen  1,000 mg Oral TID   ARIPiprazole  15 mg Oral Daily   benztropine  0.25 mg Oral BID   bisacodyl  10 mg Rectal Daily   Chlorhexidine Gluconate Cloth  6 each Topical Q0600   darbepoetin (ARANESP) injection - NON-DIALYSIS  100 mcg Subcutaneous Q Wed-1800  docusate  100 mg Oral BID   feeding supplement  237 mL Oral BID BM   midodrine  10 mg Oral TID WC   mirtazapine  15 mg Oral QHS   multivitamin  1 tablet Oral QHS   mouth rinse  15 mL Mouth Rinse 4 times per day   pantoprazole (PROTONIX) IV  40 mg Intravenous Q12H   polyethylene glycol  17 g Oral BID   senna-docusate  1 tablet Oral BID   sevelamer carbonate  1,600 mg Oral TID WC   HD Orders MWF Previously [facility]  Assessment/Plan: S/P hemiarthroplasty for left hip fracture: plan for SNF, rehab.    ESRD: On HD per MWF schedule (previously [facility] in [place]). -HD on MWF schedule, will continue to try for HD in chair. Next HD 03/25/2023   Anemia CKD: Very high ferritin level.  Continue Aranesp and monitor hemoglobin. HGB 8.3.  Transfuse as needed for hgb  <7.   CKD-MBD: Hypercalcemia noted. Corrected Ca+ 11.4. No calcium based binders or meds. Check PTH. If PTH is high, start sensipar and see if we can lower calcium. PO4 at goal.    Hypertension:/Volume: Blood pressure low, on midodrine 10mg  TID. No evidence of excess volume by exam.  UF'ing as tolerated with HD   Bright red blood per rectum, proctitis, fecal impaction - per primary service. S/p flex sig 6/21-multiple rectal ulcers, large amount of solid stool in rectum  GOC: Seen by Palliative Care. NOW DNR but will continue HD. No feeding tube. Greatly appreciate assistance from Palliative Care.    # Disposition - she is changing SNF's and will need a new outpatient dialysis unit.  (She is not able to be discharged without a new outpatient HD unit confirmed).  Note that at this time she is not acceptable for outpatient HD. She needs to be able to communicate her needs in the outpatient dialysis setting and able to sit on recliner or chair for HD. Of note, apparently sister sits with patient throughout all dialysis treatments chronically. Confirmed with sister at bedside on 6/25 6/27: did not tolerate HD in chair on 6/26. This makes her dispo more difficult. Maybe could consider LTAC or a facility that can accommodate HD in stretcher? 6/28: same issue, was deemed to be too unsafe to get in recliner by floor staff   Keon Benscoter H. Saurabh Hettich NP-C 03/23/2023, 9:46 AM  [employer] [phone]

## 2023-03-23 NOTE — Progress Notes (Signed)
PROGRESS NOTE   Madison [name]  ZOX:[id number] DOB: 1951/03/24 DOA: 03/09/2023 PCP: Madison Pander, MD   Date of Service: the patient was seen and examined on 03/23/2023  Brief Narrative:   The patient is a 72 year old SNF resident with past medical history significant for but limited to ESRD on hemodialysis Monday Wednesday Friday, hypertension, history of schizophrenia versus bipolar disorder as well as other comorbidities who presented to the [facility] ER after a fall which occurred multiple days prior.  X-rays at [facility] ER revealed a left femoral fracture and she was transferred to [facility] given that she needed a combination of the orthopedist as well as inpatient dialysis.  She underwent unipolar Hemi arthroplasty of the left hip on 03/06/2023 and has been deemed stable from a orthopedic standpoint.  She had issues with her dialysis catheter which has not been fixed and she is getting dialysis.  She was extremely agitated and hysterically crying early on during the hospitalization and remained emotionally labile and tearful however was calmer.   We were anticipating discharging back to SNF with resumption of her normal dialysis session on Friday, 03/08/2023 however a barrier to discharge was that the patient will need to be able to tolerate sitting in a chair for 4 to 5 hours to be appropriate for outpatient HD for discharge.  She was able to dialyze in the chair early on during the hospitalization, appropriately and had no issues with that but heart rate remained elevated in the setting of pain and agitation so she was initiated on a beta-blocker and given some pain medication.    Heart rate was improved however she now has a slight leukocytosis and after repeat is trending down.  TOC attempted to call the facility and got an response so unfortunately could not discharge given that no one at the bedside picked up.  Case worker is informed that the patient's legal guardian was unhappy with the SNF  choice and requested to change her SNF placement to [facility].  [name] is able to take the patient at their facility  however because of the symptoms changed her dialysis center will need to be moved.     CBC continue to further worsen as she had bright red blood per rectum and was found to be impacted.  Having large stool ball and her bowel regimen has been escalated and obtained CT scan.  GI was consulted given her hemoglobin and bleeding and has bleeding noted to be subsiding, GI not recommending endoscopic procedures at this time.  GI feels patient bleeding likely secondary to inflammation in the rectum due to stercoral colitis, and the meds recommended for patient's constipation a few no further additional bleeding at this time..  Will need to continue monitor carefully.  Assessment and Plan:  Left femoral neck fracture 2/2 fall, stable S/p unipolar hemiarthroplasty of the left hip 03/09/2023 without any significant complications. -Continue current pain regimen of oxycodone 5 to 10 mg every 4 hours as needed moderate pain, acetaminophen 650 mg 3 times daily, fentanyl 12.5 mg every 2 hours as needed for severe pain. -Orthopedics recommending WBAT left lower extremity with assistance and posterior hip precautions for 12 weeks with daily wound care as needed and DVT prophylaxis with subcu heparin x 28 days. -Continue current bowel regimen. -Outpatient follow-up with orthopedic surgery 10 days postdischarge.   Schizoaffective disorder/bipolar type with uncontrollable crying and agitation intermittently/acute metabolic encephalopathy Patient noted to have been extremely agitated and anxious a few days ago  however noted to be drowsy on 03/13/2023, alert 03/14/2023 however tearful and noted to have intermittent agitation and confusion.    -Psychiatry consulted for uncontrollable crying and intermittent agitation. Has seen pt multiple times during admission: Valproic acid 18, converting to  pills -Continue home regimen Abilify, Depakote, Cogentin and mirtazapine. -Haldol as needed.  ESRD on HD MWF, stable Unable to safely sit in recliner for HD on 6/28.  Pt was last able to sit up in chair for dialysis 1 month ago. Will continue to work towards this goal. -Nephrology following: patient needs to be dialyzed in the chair prior to being discharged and needs improved mental status prior to being set up for outpatient dialysis so she is able to communicate his needs during hemodialysis.   -Continue sevelamer 1600 mg 3 times daily -Avoid nephrotoxic medications, contrast dyes, hypotension and dehydration to ensure adequate renal perfusion -Continue midodrine 10 mg 3 times  Constipation and fecal impaction CT abdomen and pelvis done with large amount of stool in the rectum, wall thickening in the rectum perirectal stranding, findings suggesting fecal impaction and possible proctitis. No evidence of intestinal obstruction or pneumoperitoneum, no hydronephrosis. -Continue current bowel regimen of MiraLAX twice daily, Senokot-S twice daily. -Patient seen by GI and underwent flexible sigmoidoscopy 03/12/2023 which showed large amount of solid stool in the rectum, partially cleared, 15 mm polyp in the rectum biopsied, multiple ulcers in the rectum biopsied. GI recommended: bowel regimen and enemas for further stool evacuation and to avoid constipation as this will worsen stercoral ulcers, outpatient follow-up wth [facility] for consideration of EGD/colonoscopy for further evaluation of anemia and removal of colon polyps in the future once patient has recovered from her recent hip fracture.  -Continue to monitor bowel movements   Community-acquired pneumonia, resolved CT scan done showed small new patchy infiltrates in both lower lower lung fields suggest atelectasis/pneumonia.  Small bilateral pleural effusions more on the left.  Small pericardial effusion, coronary artery disease. Repeat CXR on  03/13/2023  lower lung volumes with increasing patchy left basilar airspace disease, suspicious for pneumonia.  Small bilateral effusions. Zosyn on 03/14/2023-03/21/2023 -Continue to monitor  Anemia of chronic kidney disease/postop anemia/BRBPR and concern for GI bleed (Likely stercoral colitis) Hb 8.3 this morning, from 8.5 yesterday. S/p 2 units PRBCs. -Hb transfusion threshold hemoglobin < 7 -Continue monitor with CBC -GI consulted and patient underwent flexible sigmoidoscopy 02/27/2023 which showed large amount of solid stool in the rectum, partially cleared, 15 mm polyp in the rectum biopsied, multiple ulcers in the rectum biopsied. -Post flex sigmoidoscopy GI recommending ongoing bowel regimen and enemas for further stool evacuation and to avoid constipation as this will worsen stercoral ulcers, outpatient follow-up with [facility] for consideration of EGD/colonoscopy for further evaluation of anemia and removal of colon polyps in the future once patient has recovered from her recent hip fracture. -Continue Darbepoetin alfa 60 mcg q. Mondays.  Nonsevere protein calorie malnutrition, moderate Secondary to chronic illnesses (ESRD, schizoaffective disorder) as evidenced by moderate fat depletion depletion. Pt feeds better with family at bedside. -Assistance with meals  -Dietitian following, after discussion with family we have agreed no to a cotrak -Palliative care consulted and following.    GERD -Continue PPI.    Sinus tachycardia Noted in the setting of GI bleed constipation.  -Holding beta-blocker in the setting of soft blood pressure.    Hypoalbuminemia Chronic, stable    Subjective:  I saw patient when she returned from dialysis.  No family at bedside.  Physical Exam:  Vitals:   03/22/23 1917 03/23/23 0300 03/23/23 0437 03/23/23 0643  BP: 104/66 (!) 115/52 (!) 116/48   Pulse: (!) 109 97 88   Resp: 18 (!) 24 18   Temp: 98.5 F (36.9 C)     TempSrc: Oral     SpO2: 97% 100%  94%   Weight:    51.8 kg  Height:         General: frail appearing 72 yr old female,  tearful this morning but able to respond to me Cardio: Normal S1 and S2, RRR, no r/m/g Pulm: CTAB, normal work of breathing Extremities: No peripheral edema.  Neuro: Cranial nerves grossly intact    Data Reviewed:  I have personally reviewed and interpreted labs, imaging.   CBC: Recent Labs  Lab 03/19/23 0054 03/20/23 0257 03/21/23 0000 03/21/23 0901 03/22/23 0352 03/22/23 1629 03/23/23 0338  WBC 12.6* 15.2*  --  14.3* 11.9*  --  17.4*  HGB 8.0* 8.0* 8.5* 7.9* 7.1* 8.5* 8.3*  HCT 24.1* 24.1* 26.0* 24.2* 22.9* 26.4* 26.4*  MCV 86.1 87.3  --  90.6 90.9  --  93.3  PLT 391 367  --  396 358  --  419*    Basic Metabolic Panel: Recent Labs  Lab 03/17/23 0041 03/17/23 1124 03/19/23 0054 03/20/23 0257 03/21/23 0901 03/22/23 0352 03/23/23 0338  NA 135   < > 135 130* 134* 134* 134*  K 3.5   < > 3.3* 3.7 3.6 3.6 3.9  CL 92*   < > 95* 89* 93* 94* 92*  CO2 28   < > 26 24 25 24 26   GLUCOSE 107*   < > 81 91 80 92 99  BUN 29*   < > 17 25* 12 21 15   CREATININE 4.47*   < > 3.41* 4.98* 3.32* 4.47* 3.10*  CALCIUM 9.9   < > 9.6 9.8 9.6 9.7 9.9  MG 1.7  --   --   --   --   --   --   PHOS 4.8*   < > 4.3 5.8* 4.7* 5.0* 4.3   < > = values in this interval not displayed.    GFR: Estimated Creatinine Clearance: 13.2 mL/min (A) (by C-G formula based on SCr of 3.1 mg/dL (H)). Liver Function Tests: Recent Labs  Lab 03/19/23 0054 03/20/23 0257 03/21/23 0901 03/22/23 0352 03/23/23 0338  ALBUMIN 1.8* 1.8* 1.9* 1.8* 2.1*     Coagulation Profile: No results for input(s): "INR", "PROTIME" in the last 168 hours.  Code Status:  Full code.  Code status decision has been confirmed with: patient  Family Communication: sisters    Severity of Illness:  The appropriate patient status for this patient is INPATIENT. Inpatient status is judged to be reasonable and necessary in order to provide the  required intensity of service to ensure the patient's safety. The patient's presenting symptoms, physical exam findings, and initial radiographic and laboratory data in the context of their chronic comorbidities is felt to place them at high risk for further clinical deterioration. Furthermore, it is not anticipated that the patient will be medically stable for discharge from the hospital within 2 midnights of admission.   * I certify that at the point of admission it is my clinical judgment that the patient will require inpatient hospital care spanning beyond 2 midnights from the point of admission due to high intensity of service, high risk for further deterioration and high frequency of surveillance required.*   Author:  Rolm Gala MD  03/23/2023 8:12 AM

## 2023-03-23 NOTE — Plan of Care (Signed)
  Problem: Activity: Goal: Risk for activity intolerance will decrease Outcome: Progressing   Problem: Nutrition: Goal: Adequate nutrition will be maintained Outcome: Progressing   Problem: Coping: Goal: Level of anxiety will decrease Outcome: Progressing   Problem: Pain Managment: Goal: General experience of comfort will improve Outcome: Progressing   

## 2023-03-24 ENCOUNTER — Inpatient Hospital Stay (HOSPITAL_COMMUNITY): Payer: Medicare Other

## 2023-03-24 DIAGNOSIS — Z66 Do not resuscitate: Secondary | ICD-10-CM

## 2023-03-24 DIAGNOSIS — Z992 Dependence on renal dialysis: Secondary | ICD-10-CM | POA: Diagnosis not present

## 2023-03-24 DIAGNOSIS — N186 End stage renal disease: Secondary | ICD-10-CM | POA: Diagnosis not present

## 2023-03-24 LAB — CBC
HCT: 26.2 % — ABNORMAL LOW (ref 36.0–46.0)
Hemoglobin: 8.3 g/dL — ABNORMAL LOW (ref 12.0–15.0)
MCH: 28.7 pg (ref 26.0–34.0)
MCHC: 31.7 g/dL (ref 30.0–36.0)
MCV: 90.7 fL (ref 80.0–100.0)
Platelets: 425 10*3/uL — ABNORMAL HIGH (ref 150–400)
RBC: 2.89 MIL/uL — ABNORMAL LOW (ref 3.87–5.11)
RDW: 18.1 % — ABNORMAL HIGH (ref 11.5–15.5)
WBC: 19.6 10*3/uL — ABNORMAL HIGH (ref 4.0–10.5)
nRBC: 0 % (ref 0.0–0.2)

## 2023-03-24 LAB — APTT: aPTT: 33 seconds (ref 24–36)

## 2023-03-24 LAB — COMPREHENSIVE METABOLIC PANEL
ALT: 13 U/L (ref 0–44)
AST: 18 U/L (ref 15–41)
Albumin: 1.8 g/dL — ABNORMAL LOW (ref 3.5–5.0)
Alkaline Phosphatase: 92 U/L (ref 38–126)
Anion gap: 14 (ref 5–15)
BUN: 27 mg/dL — ABNORMAL HIGH (ref 8–23)
CO2: 25 mmol/L (ref 22–32)
Calcium: 9.6 mg/dL (ref 8.9–10.3)
Chloride: 95 mmol/L — ABNORMAL LOW (ref 98–111)
Creatinine, Ser: 4.78 mg/dL — ABNORMAL HIGH (ref 0.44–1.00)
GFR, Estimated: 9 mL/min — ABNORMAL LOW (ref >60)
Glucose, Bld: 142 mg/dL — ABNORMAL HIGH (ref 70–99)
Potassium: 4.4 mmol/L (ref 3.5–5.1)
Sodium: 134 mmol/L — ABNORMAL LOW (ref 135–145)
Total Bilirubin: 0.7 mg/dL (ref 0.3–1.2)
Total Protein: 6.3 g/dL — ABNORMAL LOW (ref 6.5–8.1)

## 2023-03-24 LAB — RENAL FUNCTION PANEL
Albumin: 2 g/dL — ABNORMAL LOW (ref 3.5–5.0)
Anion gap: 19 — ABNORMAL HIGH (ref 5–15)
BUN: 27 mg/dL — ABNORMAL HIGH (ref 8–23)
CO2: 25 mmol/L (ref 22–32)
Calcium: 10.2 mg/dL (ref 8.9–10.3)
Chloride: 92 mmol/L — ABNORMAL LOW (ref 98–111)
Creatinine, Ser: 4.49 mg/dL — ABNORMAL HIGH (ref 0.44–1.00)
GFR, Estimated: 10 mL/min — ABNORMAL LOW (ref >60)
Glucose, Bld: 114 mg/dL — ABNORMAL HIGH (ref 70–99)
Phosphorus: 4.8 mg/dL — ABNORMAL HIGH (ref 2.5–4.6)
Potassium: 4.2 mmol/L (ref 3.5–5.1)
Sodium: 136 mmol/L (ref 135–145)

## 2023-03-24 LAB — CBC WITH DIFFERENTIAL/PLATELET
Abs Immature Granulocytes: 0.1 10*3/uL — ABNORMAL HIGH (ref 0.00–0.07)
Basophils Absolute: 0.1 10*3/uL (ref 0.0–0.1)
Basophils Relative: 0 %
Eosinophils Absolute: 0 10*3/uL (ref 0.0–0.5)
Eosinophils Relative: 0 %
HCT: 22.5 % — ABNORMAL LOW (ref 36.0–46.0)
Hemoglobin: 7.2 g/dL — ABNORMAL LOW (ref 12.0–15.0)
Immature Granulocytes: 1 %
Lymphocytes Relative: 5 %
Lymphs Abs: 1 10*3/uL (ref 0.7–4.0)
MCH: 29.5 pg (ref 26.0–34.0)
MCHC: 32 g/dL (ref 30.0–36.0)
MCV: 92.2 fL (ref 80.0–100.0)
Monocytes Absolute: 1.1 10*3/uL — ABNORMAL HIGH (ref 0.1–1.0)
Monocytes Relative: 6 %
Neutro Abs: 17.3 10*3/uL — ABNORMAL HIGH (ref 1.7–7.7)
Neutrophils Relative %: 88 %
Platelets: 405 10*3/uL — ABNORMAL HIGH (ref 150–400)
RBC: 2.44 MIL/uL — ABNORMAL LOW (ref 3.87–5.11)
RDW: 18.3 % — ABNORMAL HIGH (ref 11.5–15.5)
WBC: 19.5 10*3/uL — ABNORMAL HIGH (ref 4.0–10.5)
nRBC: 0 % (ref 0.0–0.2)

## 2023-03-24 LAB — PROTIME-INR
INR: 1.2 (ref 0.8–1.2)
Prothrombin Time: 15.9 seconds — ABNORMAL HIGH (ref 11.4–15.2)

## 2023-03-24 LAB — LACTIC ACID, PLASMA: Lactic Acid, Venous: 1.5 mmol/L (ref 0.5–1.9)

## 2023-03-24 MED ORDER — METRONIDAZOLE 500 MG/100ML IV SOLN
500.0000 mg | Freq: Two times a day (BID) | INTRAVENOUS | Status: DC
  Administered 2023-03-24: 500 mg via INTRAVENOUS
  Filled 2023-03-24: qty 100

## 2023-03-24 MED ORDER — GLYCOPYRROLATE 0.2 MG/ML IJ SOLN: 0.2000 mg | INTRAMUSCULAR | Status: DC | PRN

## 2023-03-24 MED ORDER — VANCOMYCIN HCL IN DEXTROSE 1-5 GM/200ML-% IV SOLN
1000.0000 mg | Freq: Once | INTRAVENOUS | Status: AC
  Administered 2023-03-24: 1000 mg via INTRAVENOUS
  Filled 2023-03-24 (×2): qty 200

## 2023-03-24 MED ORDER — ACETAMINOPHEN 325 MG PO TABS: 650.0000 mg | ORAL_TABLET | Freq: Four times a day (QID) | ORAL | Status: DC | PRN

## 2023-03-24 MED ORDER — MORPHINE BOLUS VIA INFUSION: 5.0000 mg | INTRAVENOUS | Status: DC | PRN

## 2023-03-24 MED ORDER — ACETAMINOPHEN 650 MG RE SUPP: 650.0000 mg | Freq: Four times a day (QID) | RECTAL | Status: DC | PRN

## 2023-03-24 MED ORDER — PIPERACILLIN-TAZOBACTAM IN DEX 2-0.25 GM/50ML IV SOLN
2.2500 g | Freq: Three times a day (TID) | INTRAVENOUS | Status: DC
  Filled 2023-03-24 (×2): qty 50

## 2023-03-24 MED ORDER — SODIUM CHLORIDE 0.9 % IV SOLN: INTRAVENOUS | Status: DC

## 2023-03-24 MED ORDER — POLYVINYL ALCOHOL 1.4 % OP SOLN: 1.0000 [drp] | Freq: Four times a day (QID) | OPHTHALMIC | Status: DC | PRN

## 2023-03-24 MED ORDER — VANCOMYCIN HCL 500 MG/100ML IV SOLN: 500.0000 mg | INTRAVENOUS | Status: DC

## 2023-03-24 MED ORDER — SODIUM CHLORIDE 0.9 % IV BOLUS
250.0000 mL | Freq: Once | INTRAVENOUS | Status: AC
  Administered 2023-03-24: 250 mL via INTRAVENOUS

## 2023-03-24 MED ORDER — GLYCOPYRROLATE 1 MG PO TABS: 1.0000 mg | ORAL_TABLET | ORAL | Status: DC | PRN

## 2023-03-24 MED ORDER — MORPHINE 100MG IN NS 100ML (1MG/ML) PREMIX INFUSION
0.0000 mg/h | INTRAVENOUS | Status: DC
  Administered 2023-03-24: 5 mg/h via INTRAVENOUS
  Administered 2023-03-25: 10 mg/h via INTRAVENOUS
  Administered 2023-03-25: 8 mg/h via INTRAVENOUS
  Administered 2023-03-25: 5 mg/h via INTRAVENOUS
  Filled 2023-03-24 (×4): qty 100

## 2023-03-24 MED ORDER — SODIUM CHLORIDE 0.9 % IV SOLN
1.0000 g | INTRAVENOUS | Status: DC
  Administered 2023-03-24: 1 g via INTRAVENOUS
  Filled 2023-03-24: qty 10

## 2023-03-24 NOTE — Progress Notes (Signed)
Pharmacy Antibiotic Note  Madison [name] is a 72 y.o. female admitted on 03/07/2023 with sepsis.  Pharmacy has been consulted for vanco / cefepime dosing.  Plan: Vanco 1 gram iv load f/b 500 mg iv qmwf s/p HD session ; cefepime 1 gm iv qday ; flagyl 500 mg iv q12h  Height: 5\' 4"  (162.6 cm) Weight: 53 kg (116 lb 13.5 oz) IBW/kg (Calculated) : 54.7  Temp (24hrs), Avg:98.9 F (37.2 C), Min:97.9 F (36.6 C), Max:99.9 F (37.7 C)  Recent Labs  Lab 03/20/23 0257 03/21/23 0901 03/22/23 0352 03/23/23 0338 03/24/23 0406  WBC 15.2* 14.3* 11.9* 17.4* 19.6*  CREATININE 4.98* 3.32* 4.47* 3.10* 4.49*    Estimated Creatinine Clearance: 9.3 mL/min (A) (by C-G formula based on SCr of 4.49 mg/dL (H)).    No Known Allergies  Antimicrobials this admission: vanco 6/30 >>  zosyn 6/19 >> 6/27 Cefepime 6/30 >> Flagyl 6/30 >>   Dose adjustments this admission: Adjusted vanco / cefepime for HD   Thank you for allowing pharmacy to be a part of this patient's care.  Greta Doom BS, PharmD, BCPS Clinical Pharmacist 03/24/2023 8:53 AM  Contact: [phone] after 3 PM  "Be curious, not judgmental..." -Debbora Dus

## 2023-03-24 NOTE — Progress Notes (Addendum)
PROGRESS NOTE   Madison [name]  ZOX:[id number] DOB: [birth year] DOA: 03/06/2023 PCP: Charlynne Pander, MD   Date of Service: the patient was seen and examined on 03/24/2023  Brief Narrative:   The patient is a 72 year old SNF resident with past medical history significant for but limited to ESRD on hemodialysis Monday Wednesday Friday, hypertension, history of schizophrenia versus bipolar disorder as well as other comorbidities who presented to the [facility] ER after a fall which occurred multiple days prior.  X-rays at [facility] ER revealed a [facility] femoral [facility] that she needed a combination of the orthopedist as well as inpatient dialysis.  She underwent unipolar Hemi arthroplasty of the left hip on 03/10/2023 and has been deemed stable from a orthopedic standpoint.  She had issues with her dialysis catheter which has not been fixed and she is getting dialysis.  She was extremely agitated and hysterically crying early on during the hospitalization and remained emotionally labile and tearful however was calmer.   We were anticipating discharging back to SNF with resumption of her normal dialysis session on Friday, 03/08/2023 however a barrier to discharge was that the patient will need to be able to tolerate sitting in a chair for 4 to 5 hours to be appropriate for outpatient HD for discharge.  She was able to dialyze in the chair early on during the hospitalization, appropriately and had no issues with that but heart rate remained elevated in the setting of pain and agitation so she was initiated on a beta-blocker and given some pain medication.    Heart rate was improved however she now has a slight leukocytosis and after repeat is trending down.  TOC attempted to call the facility and got an response so unfortunately could not discharge given that no one at the bedside picked up.  Case worker is informed that the patient's legal guardian was unhappy with the SNF  choice and requested to change her SNF placement to [facility].  [facility] is able to take the patient at their facility  however because of the symptoms changed her dialysis center will need to be moved.     CBC continue to further worsen as she had bright red blood per rectum and was found to be impacted.  Having large stool ball and her bowel regimen has been escalated and obtained CT scan.  GI was consulted given her hemoglobin and bleeding and has bleeding noted to be subsiding, GI not recommending endoscopic procedures at this time.  GI feels patient bleeding likely secondary to inflammation in the rectum due to stercoral colitis, and the meds recommended for patient's constipation a few no further additional bleeding at this time..  Will need to continue monitor carefully.  Assessment and Plan:   Community-acquired pneumonia Sepsis  Previously treated with Zosyn on 03/14/2023-03/21/2023 for a CAP.  Due to patient's worsening symptoms and vital signs overnight-HR 125, RR 32, temp 99.9, BP 119/74, CXR was done.  Showed enlargement of left-sided pleural effusion with possible consolidation.  WBC increasing to 19 from 17 yesterday.  Sats 100% on 2 L. -Activating code sepsis -Follow-up blood cultures -IV cefepime and IV Flagyl -Holding off on IV fluids due to ESRD, can small boluses as needed -Family updated this morning  Moderate-large left pleural effusion Discussed with family regarding drainage of the pleural effusion to help with patient's symptoms.  I explained that dialysis will not help with this kind of fluid accumulation.  They  were agreeable follow-up CCM recommendations.  -CCM/pulm consult this morning  Goals of care Overall poor prognosis -Contact palliative team   Left femoral neck fracture 2/2 fall, stable S/p unipolar hemiarthroplasty of the left hip 03/12/2023 without any significant complications. -Continue current pain regimen of oxycodone 5 to 10 mg every 4 hours  as needed moderate pain, acetaminophen 650 mg 3 times daily, fentanyl 12.5 mg every 2 hours as needed for severe pain. -Orthopedics recommending WBAT left lower extremity with assistance and posterior hip precautions for 12 weeks with daily wound care as needed and DVT prophylaxis with subcu heparin x 28 days. -Continue current bowel regimen. -Outpatient follow-up with orthopedic surgery 10 days postdischarge.   Schizoaffective disorder/bipolar type with uncontrollable crying and agitation intermittently/acute metabolic encephalopathy Patient noted to have been extremely agitated and anxious a few days ago however noted to be drowsy on 03/13/2023, alert 03/14/2023 however tearful and noted to have intermittent agitation and confusion.    -Psychiatry consulted for uncontrollable crying and intermittent agitation. Has seen pt multiple times during admission: Valproic acid 18, converting to pills -Continue home regimen Abilify, Depakote, Cogentin and mirtazapine. -Haldol as needed.  ESRD on HD MWF, stable Unable to safely sit in recliner for HD on 6/28.  Pt was last able to sit up in chair for dialysis 1 month ago. Will continue to work towards this goal. -Nephrology following: patient needs to be dialyzed in the chair prior to being discharged and needs improved mental status prior to being set up for outpatient dialysis so she is able to communicate his needs during hemodialysis.   -Continue sevelamer 1600 mg 3 times daily -Avoid nephrotoxic medications, contrast dyes, hypotension and dehydration to ensure adequate renal perfusion -Continue midodrine 10 mg 3 times  Constipation and fecal impaction CT abdomen and pelvis done with large amount of stool in the rectum, wall thickening in the rectum perirectal stranding, findings suggesting fecal impaction and possible proctitis. No evidence of intestinal obstruction or pneumoperitoneum, no hydronephrosis. -Continue current bowel regimen of MiraLAX twice  daily, Senokot-S twice daily. -Patient seen by GI and underwent flexible sigmoidoscopy 03/20/2023 which showed large amount of solid stool in the rectum, partially cleared, 15 mm polyp in the rectum biopsied, multiple ulcers in the rectum biopsied. GI recommended: bowel regimen and enemas for further stool evacuation and to avoid constipation as this will worsen stercoral ulcers, outpatient follow-up wth [facility] for consideration of EGD/colonoscopy for further evaluation of anemia and removal of colon polyps in the future once patient has recovered from her recent hip fracture.  -Continue to monitor bowel movements   Anemia of chronic kidney disease/postop anemia/BRBPR and concern for GI bleed (Likely stercoral colitis) Hb 8.3 this morning, stable. S/p 2 units PRBCs. -Hb transfusion threshold hemoglobin < 7 -Continue monitor with CBC -GI consulted and patient underwent flexible sigmoidoscopy 03/14/2023 which showed large amount of solid stool in the rectum, partially cleared, 15 mm polyp in the rectum biopsied, multiple ulcers in the rectum biopsied. -Post flex sigmoidoscopy GI recommending ongoing bowel regimen and enemas for further stool evacuation and to avoid constipation as this will worsen stercoral ulcers, outpatient follow-up with [facility] for consideration of EGD/colonoscopy for further evaluation of anemia and removal of colon polyps in the future once patient has recovered from her recent hip fracture. -Continue Darbepoetin alfa 60 mcg q. Mondays.  Nonsevere protein calorie malnutrition, moderate Secondary to chronic illnesses (ESRD, schizoaffective disorder) as evidenced by moderate fat depletion depletion. Pt feeds better with family  at bedside. -Assistance with meals  -Dietitian following, after discussion with family we have agreed no to a cotrak -Palliative care consulted and following.    GERD -Continue PPI.     Hypoalbuminemia Chronic, stable    Subjective:  Patient  minimally rousable today when I spoke to her.  Physical Exam:  Vitals:   03/24/23 0643 03/24/23 0647 03/24/23 0753 03/24/23 0853  BP:  (!) 111/57 106/61 119/74  Pulse: (!) 118 (!) 121 (!) 116 (!) 125  Resp: (!) 21 (!) 30 (!) 27 (!) 32  Temp:   98.9 F (37.2 C) 99.9 F (37.7 C)  TempSrc:   Oral Oral  SpO2: 100% 100% 100% 100%  Weight:      Height:         General: frail appearing 72 yr old female, clammy Cardio: Normal S1 and S2, tachycardic, regular rhythm Pulm: Increased work of breathing, difficult exam due to poor positioning of patient Extremities: No peripheral edema.  Neuro: Cranial nerves grossly intact    Data Reviewed:  I have personally reviewed and interpreted labs, imaging.   CBC: Recent Labs  Lab 03/20/23 0257 03/21/23 0000 03/21/23 0901 03/22/23 0352 03/22/23 1629 03/23/23 0338 03/24/23 0406  WBC 15.2*  --  14.3* 11.9*  --  17.4* 19.6*  HGB 8.0*   < > 7.9* 7.1* 8.5* 8.3* 8.3*  HCT 24.1*   < > 24.2* 22.9* 26.4* 26.4* 26.2*  MCV 87.3  --  90.6 90.9  --  93.3 90.7  PLT 367  --  396 358  --  419* 425*   < > = values in this interval not displayed.    Basic Metabolic Panel: Recent Labs  Lab 03/20/23 0257 03/21/23 0901 03/22/23 0352 03/23/23 0338 03/24/23 0406  NA 130* 134* 134* 134* 136  K 3.7 3.6 3.6 3.9 4.2  CL 89* 93* 94* 92* 92*  CO2 24 25 24 26 25   GLUCOSE 91 80 92 99 114*  BUN 25* 12 21 15  27*  CREATININE 4.98* 3.32* 4.47* 3.10* 4.49*  CALCIUM 9.8 9.6 9.7 9.9 10.2  PHOS 5.8* 4.7* 5.0* 4.3 4.8*    GFR: Estimated Creatinine Clearance: 9.3 mL/min (A) (by C-G formula based on SCr of 4.49 mg/dL (H)). Liver Function Tests: Recent Labs  Lab 03/20/23 0257 03/21/23 0901 03/22/23 0352 03/23/23 0338 03/24/23 0406  ALBUMIN 1.8* 1.9* 1.8* 2.1* 2.0*     Coagulation Profile: No results for input(s): "INR", "PROTIME" in the last 168 hours.  Code Status:  Full code.  Code status decision has been confirmed with: patient  Family  Communication: sisters    Severity of Illness:  The appropriate patient status for this patient is INPATIENT. Inpatient status is judged to be reasonable and necessary in order to provide the required intensity of service to ensure the patient's safety. The patient's presenting symptoms, physical exam findings, and initial radiographic and laboratory data in the context of their chronic comorbidities is felt to place them at high risk for further clinical deterioration. Furthermore, it is not anticipated that the patient will be medically stable for discharge from the hospital within 2 midnights of admission.   * I certify that at the point of admission it is my clinical judgment that the patient will require inpatient hospital care spanning beyond 2 midnights from the point of admission due to high intensity of service, high risk for further deterioration and high frequency of surveillance required.*   Author:  Rolm Gala MD  03/24/2023 9:12  AM

## 2023-03-24 NOTE — Progress Notes (Addendum)
[facility] Progress Note   Subjective: Seen in room, resting with eyes closed. Agitated when awakened. Lab at bedside collecting [facility]  Objective Vitals:   03/24/23 0643 03/24/23 0647 03/24/23 0753 03/24/23 0853  BP:  (!) 111/57 106/61 119/74  Pulse: (!) 118 (!) 121 (!) 116 (!) 125  Resp: (!) 21 (!) 30 (!) 27 (!) 32  Temp:   98.9 F (37.2 C) 99.9 F (37.7 C)  TempSrc:   Oral Oral  SpO2: 100% 100% 100% 100%  Weight:      Height:       Physical Exam General: chronically ill appearing female, agitated at present. Heart: Tachy S1,S2. No M/R/G. ST on monitor rate 100-110 Lungs: CTAB A/P Abdomen: Soft, NABS Extremities: Abduction pillow between legs. No LE edema.  Dialysis Access: L AVF + T/B    Additional Objective Labs: Basic Metabolic Panel: Recent Labs  Lab 03/22/23 0352 03/23/23 0338 03/24/23 0406  NA 134* 134* 136  K 3.6 3.9 4.2  CL 94* 92* 92*  CO2 24 26 25   GLUCOSE 92 99 114*  BUN 21 15 27*  CREATININE 4.47* 3.10* 4.49*  CALCIUM 9.7 9.9 10.2  PHOS 5.0* 4.3 4.8*   Liver Function Tests: Recent Labs  Lab 03/22/23 0352 03/23/23 0338 03/24/23 0406  ALBUMIN 1.8* 2.1* 2.0*   No results for input(s): "LIPASE", "AMYLASE" in the last 168 hours. CBC: Recent Labs  Lab 03/20/23 0257 03/21/23 0000 03/21/23 0901 03/22/23 0352 03/22/23 1629 03/23/23 0338 03/24/23 0406  WBC 15.2*  --  14.3* 11.9*  --  17.4* 19.6*  HGB 8.0*   < > 7.9* 7.1* 8.5* 8.3* 8.3*  HCT 24.1*   < > 24.2* 22.9* 26.4* 26.4* 26.2*  MCV 87.3  --  90.6 90.9  --  93.3 90.7  PLT 367  --  396 358  --  419* 425*   < > = values in this interval not displayed.   Blood Culture    Component Value Date/Time   SDES BLOOD SITE NOT SPECIFIED 03/10/2023 0951   SDES BLOOD SITE NOT SPECIFIED 03/10/2023 0951   SPECREQUEST  03/10/2023 0951    BOTTLES DRAWN AEROBIC AND ANAEROBIC Blood Culture results may not be optimal due to an inadequate volume of blood received in culture bottles    SPECREQUEST  03/10/2023 0951    AEROBIC BOTTLE ONLY Blood Culture results may not be optimal due to an inadequate volume of blood received in culture bottles   CULT  03/10/2023 0951    NO GROWTH 5 DAYS Performed at [facility], [address], [place], [place] [address]    CULT  03/10/2023 [phone]    NO GROWTH 5 DAYS Performed at [facility], [address], [place], [place] [address]    REPTSTATUS 03/23/2023 FINAL 03/10/2023 0951   REPTSTATUS 03/24/2023 FINAL 03/10/2023 0951    Cardiac Enzymes: No results for input(s): "CKTOTAL", "CKMB", "CKMBINDEX", "TROPONINI" in the last 168 hours. CBG: No results for input(s): "GLUCAP" in the last 168 hours. Iron Studies: No results for input(s): "IRON", "TIBC", "TRANSFERRIN", "FERRITIN" in the last 72 hours. @lablastinr3 @ Studies/Results: DG CHEST PORT 1 VIEW  Result Date: 03/24/2023 CLINICAL DATA:  82474.  Respiratory complication. EXAM: PORTABLE CHEST 1 VIEW COMPARISON:  Portable chest 03/13/2023 FINDINGS: 7:30 a.m. There has now a moderate-to-large, increased left pleural effusion, with increased opacity over the left mid to lower lung fields which could be due to adjacent atelectasis or consolidation. A small right pleural effusion  is again noted and is unchanged. The lungs are otherwise clear. There is mild cardiomegaly but no vascular distention. Aortic tortuosity and atherosclerosis with stable mediastinum. Osteopenia. No new osseous findings. Vascular stent medial left upper arm is again noted. IMPRESSION: 1. Moderate-to-large, increased left pleural effusion with increased opacity over the left mid to lower lung fields which could be due to atelectasis or consolidation. 2. Stable small right pleural effusion. Electronically Signed   By: Almira Bar M.D.   On: 03/24/2023 07:41   Medications:  sodium chloride 10 mL/hr at 03/23/23 1931   ceFEPime (MAXIPIME) IV     metronidazole 500 mg (03/24/23 0912)   vancomycin 1,000 mg  (03/24/23 0858)   [START ON 03/25/2023] vancomycin      acetaminophen  1,000 mg Oral TID   ARIPiprazole  15 mg Oral Daily   benztropine  0.25 mg Oral BID   bisacodyl  10 mg Rectal Daily   Chlorhexidine Gluconate Cloth  6 each Topical Q0600   darbepoetin (ARANESP) injection - NON-DIALYSIS  100 mcg Subcutaneous Q Wed-1800   docusate  100 mg Oral BID   feeding supplement  237 mL Oral BID BM   midodrine  10 mg Oral TID WC   mirtazapine  15 mg Oral QHS   multivitamin  1 tablet Oral QHS   mouth rinse  15 mL Mouth Rinse 4 times per day   pantoprazole (PROTONIX) IV  40 mg Intravenous Q12H   polyethylene glycol  17 g Oral BID   senna-docusate  1 tablet Oral BID   sevelamer carbonate  1.6 g Oral TID WC   valproic acid  250 mg Oral BID     HD Orders MWF Previously [facility]   Assessment/Plan: S/P hemiarthroplasty for left hip fracture: plan for SNF, rehab.   Sepsis: WBC ^ 19.6 BC X 2 drawn. Has been ABX per pharmacy. Per primary.     Large Left Pleural Effusion: PCM consulted per primary   ESRD: On HD per MWF schedule (previously [facility] in [place]). -HD on MWF schedule, will continue to try for HD in chair. Next HD 03/25/2023   Anemia CKD: Very high ferritin level.  Continue Aranesp and monitor hemoglobin. HGB 8.3.  Transfuse as needed for hgb <7.   CKD-MBD: Hypercalcemia noted. Corrected Ca+ 11.4. No calcium based binders or meds. Check PTH. If PTH is high, start sensipar and see if we can lower calcium. PO4 at goal.    Hypertension:/Volume: Blood pressure low, on midodrine 10mg  TID. No evidence of excess volume by exam.  UF'ing as tolerated with HD   Bright red blood per rectum, proctitis, fecal impaction - per primary service. S/p flex sig 6/21-multiple rectal ulcers, large amount of solid stool in rectum   GOC: Seen by Palliative Care. NOW DNR but will continue HD. No feeding tube. Greatly appreciate assistance from Palliative Care.    # Disposition - she is  changing SNF's and will need a new outpatient dialysis unit.  (She is not able to be discharged without a new outpatient HD unit confirmed).  Note that at this time she is not acceptable for outpatient HD. She needs to be able to communicate her needs in the outpatient dialysis setting and able to sit on recliner or chair for HD. Of note, apparently sister sits with patient throughout all dialysis treatments chronically. Confirmed with sister at bedside on 6/25 6/27: did not tolerate HD in chair on 6/26. This makes her dispo more difficult. Maybe  could consider LTAC or a facility that can accommodate HD in stretcher? 6/28: same issue, was deemed to be too unsafe to get in recliner by floor staff  Kaila Devries H. Anaiyah Anglemyer NP-C 03/24/2023, 9:16 AM  [employer] [phone]

## 2023-03-24 NOTE — Progress Notes (Signed)
HPI:  72 y.o. female  with past medical history of ESRD, schizoaffective disorder bipolar type, anxiety, neuropathy admitted on 03/14/2023 with fall at facility with left hip fracture s/p surgical repair 6/6. Hospitalization complicated by issues with dialysis catheter, agitation and behavioral disturbance, and inability to sit in chair for dialysis, rectal bleeding likely secondary to stercoral colitis.   I met at bed at bedside with patient's Bonnita Levan, Lajoyce Corners RN, and Dr. Allena Katz. Family provided with updates.  Patient is lethargic.  Occasional grimacing and moans however minimal response to verbal stimuli or touch.  Sister is holding patient's hand.  Verbalizes that patient has struggled with her health for many years.  Beverly reads a story that she wrote about her sister one day when she was sitting with her at dialysis.  Her story shared the challenges that Lafawn and her twin sister encountered throughout their lives.  Emotional support provided by myself and Dr. Allena Katz.  I created space and opportunity for Meriam Sprague to express her thoughts and feelings regarding Glorya's recent decline and sudden change. 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  Koriann has her Bible and is referring to scriptures.  Emotional support provided.  I had an open and direct discussion with sister that the medical team is worried that she is rapidly approaching end-of-life despite all medical interventions.  Sister verbalized understanding stating "I know".  I recommended focusing on patient's comfort allowing family and friends to be present for what time she has left.  Education provided on what comfort measures would look like during hospitalization with awareness that patient would pass away in the hospital.  Daughter verbalized understanding.  She states that she would be open to this after knowing that there is absolutely no other options available.  She is  requesting time to hear back from pulmonology regarding ability to remove fluid.  We discussed what this procedure would look like, pros and cons, as well as I discussed patient's overall condition with and without procedure.  Unfortunately Shamaine's prognosis continues to be poor overall.  Beverly verbalized understanding.  States she has discussed with her other siblings again confirming they would be open to focusing on her comfort and "letting her go" once they have heard from all of the medical team.  I advised given significant change there is a high likelihood patient could pass away at any time.  She verbalized understanding and stated if this occurred she would be at peace.  DNR/DNI confirmed.   Sister began reading of the story she had written.  Allow space for her to express her feelings and thoughts.  All questions answered and support provided.  Exam: Lethargic, tachycardic, nasal cannula in place.  Plan: -DNR/DNI -Family to make decisions regarding transitioning to comfort measures only pending discussions with pulmonology and medical team.  50 min  Any controlled substances utilized were prescribed in the context of palliative care. PDMP has been reviewed.    Visit consisted of counseling and education dealing with the complex and emotionally intense issues of symptom management and palliative care in the setting of serious and potentially life-threatening illness.Greater than 50%  of this time was spent counseling and coordinating care related to the above assessment and plan.  Willette Alma, AGPCNP-BC  Palliative Medicine Team  *Please note that this is a verbal dictation therefore any spelling or grammatical errors are due to the "[employer]agon [employer] One" system interpretation.

## 2023-03-24 NOTE — Sepsis Progress Note (Addendum)
Elink following code sepsis  0915 spoke with bedside RN, abx started prior to labs drawn. Bedside RN instructed by pharmacy she had 40 mins to give abx when they arrived and bedside RN started them at that time prior to lab arriving.   1208 Code Sepsis monitoring discontinued due to cancellation. Order written to transition to comfort care, elink will stop monitoring at this time

## 2023-03-24 NOTE — Significant Event (Addendum)
Rapid Response Event Note   Reason for Call :  Tachypneic, tachycardic  Initial Focused Assessment:  Patient in bed opens eyes to voice briefly, not able to respond. Patient is tachpneic and is in no acute distress appearing comfortable. Skin is warm to touch, dry. Minimal edema present, non-tenting. Lungs diminished, heart tones fast.   106/61 (75) HR 122 RR 28-36 O2 100% 2L [place] Temp 98.9 oral  Interventions:  MD notified Sepsis activated: CXR already completed this AM, not included in orderset. Per discussion with MD also did not include urine UA/UC as pt is oliguric.   Plan of Care:  Continue current treatment plan. Family to come to bedside this AM to discuss with care team further plan of care.   Event Summary:  MD Notified: Leodis Binet MD Call Time: [id number] Arrival Time: 0800 End Time: 0830  Truddie Crumble, RN

## 2023-03-24 NOTE — Progress Notes (Signed)
Provided update to Madison [name] on the phone regarding deterioration of her sister. I explained about increasing in pleural effusion size and we could benefit from a chest drain for symptom relief, however explaining that it is invasive. She would like [place] to reach out to pulm regarding the pleural effusion.

## 2023-03-24 NOTE — Progress Notes (Addendum)
Patient with continued tachycardia and tachypnea. Called Rapid Response Nurse at this time to advise. Dr. Allena Katz made aware that tachypnea and tachycardia continues. Dr. Allena Katz requested that family be called to bedside and Palliative Care to be paged as well. Palliative Care provider paged to bedside.     03/24/23 0753  Assess: MEWS Score  Temp 98.9 F (37.2 C)  BP 106/61  MAP (mmHg) 75  Pulse Rate (!) 116  ECG Heart Rate (!) 116  Resp (!) 27  Level of Consciousness Responds to Pain  SpO2 100 %  O2 Device Nasal Cannula  O2 Flow Rate (L/min) 2 L/min  Assess: MEWS Score  MEWS Temp 0  MEWS Systolic 0  MEWS Pulse 2  MEWS RR 2  MEWS LOC 2  MEWS Score 6  MEWS Score Color Red  Assess: if the MEWS score is Yellow or Red  Were vital signs taken at a resting state? Yes  Focused Assessment Change from prior assessment (see assessment flowsheet)  Does the patient meet 2 or more of the SIRS criteria? Yes  Does the patient have a confirmed or suspected source of infection? Yes  MEWS guidelines implemented  No, previously red, continue vital signs every 4 hours  Provider Notification  Provider Name/Title Dr. Patel/ Attending  Date Provider Notified 03/24/23  Time Provider Notified 0800  Method of Notification Rounds  Notification Reason Change in status  Provider response En route;Evaluate remotely;See new orders  Date of Provider Response 03/24/23  Time of Provider Response 0805  Notify: Rapid Response  Name of Rapid Response RN Notified Scarlette Slice RN  Date Rapid Response Notified 03/24/23  Time Rapid Response Notified 0755  Assess: SIRS CRITERIA  SIRS Temperature  0  SIRS Pulse 1  SIRS Respirations  1  SIRS WBC 1  SIRS Score Sum  3

## 2023-03-24 NOTE — Consult Note (Signed)
NAME:  Madison [name], MRN:  [id number], DOB:  July 14, 1951, LOS: 26 ADMISSION DATE:  03/05/2023, CONSULTATION DATE:  03/24/23 REFERRING MD:  Allena Katz, CHIEF COMPLAINT:  EOL   History of Present Illness:  72 year old woman w/ ESRD, schizoaffective disorder presenting after fall on 03/16/2023 with left hip fx repaired 6/6.  Postop course complicated by HD catheter issues, LGIB, ambulatory failure, behavioral disorder, and FTT.  Today worsening resp status and CXR showing L effusion and infiltrate, PCCM consulted.  Patient currently nearly comatose breathing 30/min with HR in the 130s.  Pertinent  Medical History   Past Medical History:  Diagnosis Date   Anxiety    Bipolar disorder (HCC)    ESRD (end stage renal disease) on dialysis (HCC)    Neuropathy    Schizophrenia (HCC)       Significant Hospital Events: Including procedures, antibiotic start and stop dates in addition to other pertinent events   6/6 hip hemiarthroplasty 6/21 flex sig  Interim History / Subjective:  Consulted  Objective   Blood pressure 98/62, pulse (!) 116, temperature 99.9 F (37.7 C), temperature source Oral, resp. rate (!) 26, height 5\' 4"  (1.626 m), weight 53 kg, SpO2 100 %.        Intake/Output Summary (Last 24 hours) at 03/24/2023 1212 Last data filed at 03/23/2023 1931 Gross per 24 hour  Intake 194.43 ml  Output --  Net 194.43 ml   Filed Weights   03/22/23 0649 03/23/23 0643 03/24/23 0509  Weight: 54.8 kg 51.8 kg 53 kg    Examination: General: ill appearing thin woman resting in bed HENT: MM dry, +JVD, trachea midline Lungs: tachypneic, diminished left base, occasional acccessory muscle use Cardiovascular: tachycardic, ext warm Abdomen: soft, +BS Extremities: +muscle wasting Neuro: RASS -2 Skin: no rashes  Labs, imaging personally reviewed  Resolved Hospital Problem list   N/A  Assessment & Plan:  Evolving sepsis and respiratory failure in context of new LLL airspace disease and effusion  likely HCAP vs. Aspiration.  Baseline FTT, multiple medical issues.  Sisters at bedside at peace with this being EOL.  Do not think further HD, thoracentesis, abx will meaningfully prolong her life.  All in agreement to focus on comfort. - DC all non-comfort meds, interventions - Morphine titrated to RR 16 or signs of distress - Condolences and spiritual support offered to family in this difficult time - Anticipate in-hospital death - Relayed plan to primary, nephrology, RN - Available PRN  Best Practice (right click and "Reselect all SmartList Selections" daily)  Per primary  Labs   CBC: Recent Labs  Lab 03/21/23 0901 03/22/23 0352 03/22/23 1629 03/23/23 0338 03/24/23 0406 03/24/23 0926  WBC 14.3* 11.9*  --  17.4* 19.6* 19.5*  NEUTROABS  --   --   --   --   --  17.3*  HGB 7.9* 7.1* 8.5* 8.3* 8.3* 7.2*  HCT 24.2* 22.9* 26.4* 26.4* 26.2* 22.5*  MCV 90.6 90.9  --  93.3 90.7 92.2  PLT 396 358  --  419* 425* 405*    Basic Metabolic Panel: Recent Labs  Lab 03/20/23 0257 03/21/23 0901 03/22/23 0352 03/23/23 0338 03/24/23 0406 03/24/23 0926  NA 130* 134* 134* 134* 136 134*  K 3.7 3.6 3.6 3.9 4.2 4.4  CL 89* 93* 94* 92* 92* 95*  CO2 24 25 24 26 25 25   GLUCOSE 91 80 92 99 114* 142*  BUN 25* 12 21 15  27* 27*  CREATININE 4.98* 3.32* 4.47* 3.10* 4.49*  4.78*  CALCIUM 9.8 9.6 9.7 9.9 10.2 9.6  PHOS 5.8* 4.7* 5.0* 4.3 4.8*  --    GFR: Estimated Creatinine Clearance: 8.8 mL/min (A) (by C-G formula based on SCr of 4.78 mg/dL (H)). Recent Labs  Lab 03/22/23 0352 03/23/23 0338 03/24/23 0406 03/24/23 0926  WBC 11.9* 17.4* 19.6* 19.5*  LATICACIDVEN  --   --   --  1.5    Liver Function Tests: Recent Labs  Lab 03/21/23 0901 03/22/23 0352 03/23/23 0338 03/24/23 0406 03/24/23 0926  AST  --   --   --   --  18  ALT  --   --   --   --  13  ALKPHOS  --   --   --   --  92  BILITOT  --   --   --   --  0.7  PROT  --   --   --   --  6.3*  ALBUMIN 1.9* 1.8* 2.1* 2.0* 1.8*    No results for input(s): "LIPASE", "AMYLASE" in the last 168 hours. No results for input(s): "AMMONIA" in the last 168 hours.  ABG    Component Value Date/Time   TCO2 28 02/25/2023 0758     Coagulation Profile: Recent Labs  Lab 03/24/23 0926  INR 1.2    Cardiac Enzymes: No results for input(s): "CKTOTAL", "CKMB", "CKMBINDEX", "TROPONINI" in the last 168 hours.  HbA1C: No results found for: "HGBA1C"  CBG: No results for input(s): "GLUCAP" in the last 168 hours.  Review of Systems:   Comatose  Past Medical History:  She,  has a past medical history of Anxiety, Bipolar disorder (HCC), ESRD (end stage renal disease) on dialysis (HCC), Neuropathy, and Schizophrenia (HCC).   Surgical History:   Past Surgical History:  Procedure Laterality Date   BIOPSY  03/20/2023   Procedure: BIOPSY;  Surgeon: Imogene Burn, MD;  Location: [facility] ENDOSCOPY;  Service: Gastroenterology;;   CHOLECYSTECTOMY     COLONOSCOPY N/A 01/20/2016   Dr. fields: 12 mm tubular adenoma removed from the transverse colon, 4 hyperplastic polyps removed from the rectum and sigmoid colon.  Next colonoscopy planned for April 2020.   ESOPHAGOGASTRODUODENOSCOPY N/A 12/19/2017   Procedure: ESOPHAGOGASTRODUODENOSCOPY (EGD);  Surgeon: West Bali, MD;  Location: [facility] ENDO SUITE;  Service: Endoscopy;  Laterality: N/A;  8:30am   fistula left arm     FLEXIBLE SIGMOIDOSCOPY N/A 03/07/2023   Procedure: FLEXIBLE SIGMOIDOSCOPY;  Surgeon: Imogene Burn, MD;  Location[facility] ENDOSCOPY;  Service: Gastroenterology;  Laterality: N/A;   GIVENS CAPSULE STUDY N/A 01/14/2018   Procedure: GIVENS CAPSULE STUDY;  Surgeon: West Bali, MD;  Location: [facility] ENDO SUITE;  Service: Endoscopy;  Laterality: N/A;  7:30am   HEMICOLECTOMY Right    HIP ARTHROPLASTY Left 03/16/2023   Procedure: ARTHROPLASTY BIPOLAR HIP (HEMIARTHROPLASTY);  Surgeon: Myrene Galas, MD;  Location[facility] OR;  Service: Orthopedics;  Laterality: Left;   IR AV DIALY SHUNT  INTRO NEEDLE/INTRACATH INITIAL W/PTA/IMG LEFT  03/04/2023   IR US GUIDE VASC ACCESS LEFT  03/04/2023   TUBAL LIGATION       Social History:   reports that she has never smoked. She has never used smokeless tobacco. She reports that she does not drink alcohol and does not use drugs.   Family History:  Her family history includes Alzheimer's disease in her mother; Cancer in her mother; Diabetes in her mother; Kidney failure in her sister; Prostate cancer in her father. There is no history of Breast  cancer or Colon cancer.   Allergies No Known Allergies   Home Medications  Prior to Admission medications   Medication Sig Start Date End Date Taking? Authorizing Provider  ARIPiprazole (ABILIFY) 15 MG tablet Take 15 mg by mouth daily.   Yes [provider]  aspirin EC 81 MG tablet Take 81 mg by mouth daily. Swallow whole.   Yes [provider]  B Complex-C-Folic Acid (DIALYVITE TABLET) TABS Take 1 tablet by mouth at bedtime. 03/31/20  Yes [provider]  benztropine (COGENTIN) 0.5 MG tablet Take 0.5 mg by mouth 2 (two) times daily.   Yes [provider]  chlorhexidine (HIBICLENS) 4 % external liquid Apply 15 mLs (1 Application total) topically as directed for 30 doses. Use as directed daily for 5 days every other week for 6 weeks. 02/24/2023  Yes Montez Morita, PA-C  loperamide (IMODIUM) 2 MG capsule Take 1 capsule (2 mg total) by mouth as needed for diarrhea or loose stools. 12/30/22  Yes Shah, Pratik D, DO  midodrine (PROAMATINE) 5 MG tablet Take 15 mg by mouth in the morning and at bedtime.   Yes [provider]  mirtazapine (REMERON) 15 MG tablet Take 15 mg by mouth at bedtime.   Yes [provider]  mupirocin ointment (BACTROBAN) 2 % Place 1 Application into the nose 2 (two) times daily for 60 doses. Use as directed 2 times daily for 5 days every other week for 6 weeks. 03/19/2023 03/30/23 Yes Montez Morita, PA-C  Nutritional Supplements (NUTRITIONAL  SUPPLEMENT PO) Take 1 Container by mouth 2 (two) times daily. Magic Cup   Yes [provider]  omeprazole (PRILOSEC) 20 MG capsule Take 20 mg by mouth daily.   Yes [provider]  ondansetron (ZOFRAN) 4 MG tablet Take 1 tablet (4 mg total) by mouth daily as needed for nausea or vomiting. 12/30/22 12/30/23 Yes Shah, Pratik D, DO  sevelamer (RENAGEL) 800 MG tablet Take 800 mg by mouth 3 (three) times daily with meals.   Yes [provider]  thiamine (VITAMIN B-1) 100 MG tablet Take 0.5 tablets (50 mg total) by mouth daily. 01/08/19  Yes Clapacs, Jackquline Denmark, MD  UNABLE TO FIND Take 30 mLs by mouth 2 (two) times daily. Med Name: Liquid Protein   Yes [provider]  valproic acid (DEPAKENE) 250 MG/5ML solution Take 250 mg by mouth 3 (three) times daily with meals. 08/14/22  Yes [provider]  acetaminophen (TYLENOL) 325 MG tablet Take 2 tablets (650 mg total) by mouth 3 (three) times daily. 03/07/23   Montez Morita, PA-C  docusate sodium (COLACE) 100 MG capsule Take 1 capsule (100 mg total) by mouth 2 (two) times daily. 03/07/23   Montez Morita, PA-C  heparin 5000 UNIT/ML injection Inject 1 mL (5,000 Units total) into the skin every 8 (eight) hours for 21 days. 03/07/23 03/28/23  Montez Morita, PA-C  oxyCODONE (OXY IR/ROXICODONE) 5 MG immediate release tablet Take 1-2 tablets (5-10 mg total) by mouth every 8 (eight) hours as needed for moderate pain or severe pain. 03/07/23   Montez Morita, PA-C     Critical care time: N/A

## 2023-03-24 NOTE — Progress Notes (Signed)
Pt is now transitioning to comfort care.  No further dialysis. Will sign off.   Vinson Moselle  MD  [facility] 03/24/2023, 5:03 PM

## 2023-03-24 NOTE — Progress Notes (Signed)
Patient now transitioning to full comfort care. Orders received for continuous morphine infusion.  Chaplain requested by family, page sent and Chaplain at bedside.  Education provided to patient's sister Meriam Sprague at bedside.  Compassion cart ordered for family and visitors.

## 2023-03-24 NOTE — Progress Notes (Signed)
   03/24/23 0449  Assess: MEWS Score  Temp 99.3 F (37.4 C)  BP 120/63  MAP (mmHg) 75  Pulse Rate (!) 119  ECG Heart Rate (!) 121  Resp (!) 30  Level of Consciousness Alert  SpO2 99 %  O2 Device Room Air  Assess: MEWS Score  MEWS Temp 0  MEWS Systolic 0  MEWS Pulse 2  MEWS RR 2  MEWS LOC 0  MEWS Score 4  MEWS Score Color Red  Assess: if the MEWS score is Yellow or Red  Were vital signs taken at a resting state? Yes  Focused Assessment Change from prior assessment (see assessment flowsheet)  Does the patient meet 2 or more of the SIRS criteria? Yes  Does the patient have a confirmed or suspected source of infection? No  MEWS guidelines implemented  Yes, red  Treat  MEWS Interventions Considered administering scheduled or prn medications/treatments as ordered  Take Vital Signs  Increase Vital Sign Frequency  Red: Q1hr x2, continue Q4hrs until patient remains green for 12hrs  Escalate  MEWS: Escalate Red: Discuss with charge nurse and notify provider. Consider notifying RRT. If remains red for 2 hours consider need for higher level of care  Notify: Charge Nurse/RN  Name of Charge Nurse/RN Notified Jasmine December, RN  Provider Notification  Provider Name/Title Anthoney Harada, NP  Date Provider Notified 03/24/23  Time Provider Notified 0500  Method of Notification Page  Notification Reason Change in status  Provider response Other (Comment);See new orders (pain medicine given)  Date of Provider Response 03/24/23  Time of Provider Response 0501  Assess: SIRS CRITERIA  SIRS Temperature  0  SIRS Pulse 1  SIRS Respirations  1  SIRS WBC 0  SIRS Score Sum  2

## 2023-03-25 LAB — CULTURE, BLOOD (ROUTINE X 2): Special Requests: ADEQUATE

## 2023-03-25 MED ORDER — LORAZEPAM 2 MG/ML IJ SOLN: 1.0000 mg | INTRAMUSCULAR | Status: DC | PRN

## 2023-03-25 NOTE — Progress Notes (Signed)
Contacted [facility] admissions to cancel pt's referral for transfer. Contacted Kim with [facility] to make aware that stretcher HD will not be needed at d/c.   Olivia Canter Renal Navigator [phone]

## 2023-03-25 NOTE — Progress Notes (Signed)
PROGRESS NOTE   Madison [name]  ZOX:[id number] DOB: Mar 18, 1951 DOA: 02/23/2023 PCP: Charlynne Pander, MD   Date of Service: the patient was seen and examined on 03/25/2023  Brief Narrative:   The patient is a 72 year old SNF resident with past medical history significant for but limited to ESRD on hemodialysis Monday Wednesday Friday, hypertension, history of schizophrenia versus bipolar disorder as well as other comorbidities who presented to the [facility] ER after a fall which occurred multiple days prior.  X-rays at [facility] ER revealed a left femoral fracture and she was transferred to [facility] given that she needed a combination of the orthopedist as well as inpatient dialysis.  She underwent unipolar Hemi arthroplasty of the left hip on 02/27/2023 and has been deemed stable from a orthopedic standpoint.  She had issues with her dialysis catheter which has not been fixed and she is getting dialysis.  She was extremely agitated and hysterically crying early on during the hospitalization and remained emotionally labile and tearful however was calmer.   We were anticipating discharging back to SNF with resumption of her normal dialysis session on Friday, 03/08/2023 however a barrier to discharge was that the patient will need to be able to tolerate sitting in a chair for 4 to 5 hours to be appropriate for outpatient HD for discharge.  She was able to dialyze in the chair early on during the hospitalization, appropriately and had no issues with that but heart rate remained elevated in the setting of pain and agitation so she was initiated on a beta-blocker and given some pain medication.    Heart rate was improved however she now has a slight leukocytosis and after repeat is trending down.  TOC attempted to call the facility and got an response so unfortunately could not discharge given that no one at the bedside picked up.  Case worker is informed that the patient's legal guardian was unhappy with the SNF  choice and requested to change her SNF placement to [facility].  [facility] is able to take the patient at their facility  however because of the symptoms changed her dialysis center will need to be moved.     CBC continue to further worsen as she had bright red blood per rectum and was found to be impacted.  Having large stool ball and her bowel regimen has been escalated and obtained CT scan.  GI was consulted given her hemoglobin and bleeding and has bleeding noted to be subsiding, GI not recommending endoscopic procedures at this time.  GI feels patient bleeding likely secondary to inflammation in the rectum due to stercoral colitis, and the meds recommended for patient's constipation a few no further additional bleeding at this time..  Will need to continue monitor carefully.  6/30-- transition to comfort care    Assessment and Plan:   Community-acquired pneumonia Sepsis  Moderate-large left pleural effusion Left femoral neck fracture 2/2 fall, stable Schizoaffective disorder/bipolar type with uncontrollable crying and agitation intermittently/acute metabolic encephalopathy ESRD on HD [facility] Constipation and fecal impaction Anemia of chronic kidney disease/postop anemia/BRBPR and concern for GI bleed (Likely stercoral colitis) Nonsevere protein calorie malnutrition, moderate GERD Hypoalbuminemia   Transitioned to comfort care on 6/30-- started on morphine gtt Suspected in hospital death   Subjective:  Appeared labored-- morphine gtt increased   Physical Exam:  Vitals:   03/24/23 0647 03/24/23 0753 03/24/23 0853 03/24/23 0954  BP: (!) 111/57 106/61 119/74 98/62  Pulse: (!) 121 (!) 116 (!) 125 Marland Kitchen)  116  Resp: (!) 30 (!) 27 (!) 32 (!) 26  Temp:  98.9 F (37.2 C) 99.9 F (37.7 C)   TempSrc:  Oral Oral   SpO2: 100% 100% 100% 100%  Weight:      Height:         In bed, NAD   Data Reviewed:  I have personally reviewed and interpreted labs,  imaging.   CBC: Recent Labs  Lab 03/21/23 0901 03/22/23 0352 03/22/23 1629 03/23/23 0338 03/24/23 0406 03/24/23 0926  WBC 14.3* 11.9*  --  17.4* 19.6* 19.5*  NEUTROABS  --   --   --   --   --  17.3*  HGB 7.9* 7.1* 8.5* 8.3* 8.3* 7.2*  HCT 24.2* 22.9* 26.4* 26.4* 26.2* 22.5*  MCV 90.6 90.9  --  93.3 90.7 92.2  PLT 396 358  --  419* 425* 405*   Basic Metabolic Panel: Recent Labs  Lab 03/20/23 0257 03/21/23 0901 03/22/23 0352 03/23/23 0338 03/24/23 0406 03/24/23 0926  NA 130* 134* 134* 134* 136 134*  K 3.7 3.6 3.6 3.9 4.2 4.4  CL 89* 93* 94* 92* 92* 95*  CO2 24 25 24 26 25 25   GLUCOSE 91 80 92 99 114* 142*  BUN 25* 12 21 15  27* 27*  CREATININE 4.98* 3.32* 4.47* 3.10* 4.49* 4.78*  CALCIUM 9.8 9.6 9.7 9.9 10.2 9.6  PHOS 5.8* 4.7* 5.0* 4.3 4.8*  --    GFR: Estimated Creatinine Clearance: 8.8 mL/min (A) (by C-G formula based on SCr of 4.78 mg/dL (H)). Liver Function Tests: Recent Labs  Lab 03/21/23 0901 03/22/23 0352 03/23/23 0338 03/24/23 0406 03/24/23 0926  AST  --   --   --   --  18  ALT  --   --   --   --  13  ALKPHOS  --   --   --   --  92  BILITOT  --   --   --   --  0.7  PROT  --   --   --   --  6.3*  ALBUMIN 1.9* 1.8* 2.1* 2.0* 1.8*    Coagulation Profile: Recent Labs  Lab 03/24/23 0926  INR 1.2    Code Status:  DNR       Author:  Joseph Art DO  03/25/2023 9:10 AM

## 2023-03-25 NOTE — Progress Notes (Signed)
This chaplain responded to PMT NP-Alicia consult for spiritual care. The chaplain understands the Pt. is Comfort Care. The chaplain was updated by the Pt. RN-Allen before the visit. The chaplain is appreciative of the RN attention to comfort.  The Pt. is resting without distress at the time of the visit. The chaplain is at the Pt. bedside providing a shoulder touch, words of assurance, and God's love. The chaplain stepped away after prayer and is available for F/U spiritual care as needed.  Chaplain Stephanie Acre [phone]

## 2023-03-25 NOTE — Progress Notes (Addendum)
Nursing note:  Ordered new bag of morphine for morphine gtt.  Bag was secure tubed by pharmacist.  Form was signed by this nurse and tubed back to central pharmacy.  As per pharmacist, there is no waste protocol for the empty bag of morphine.    As witnessed by charge nurse Leotis Shames, the old bag of morphine was empty and was disposed of in the med room sharps container.

## 2023-03-25 DEATH — deceased

## 2023-03-26 LAB — CULTURE, BLOOD (ROUTINE X 2): Culture: NO GROWTH

## 2023-03-26 LAB — PARATHYROID HORMONE, INTACT (NO CA): PTH: 111 pg/mL — ABNORMAL HIGH (ref 15–65)

## 2023-03-27 LAB — CULTURE, BLOOD (ROUTINE X 2)

## 2023-03-28 LAB — CULTURE, BLOOD (ROUTINE X 2)

## 2023-03-29 LAB — CULTURE, BLOOD (ROUTINE X 2): Culture: NO GROWTH

## 2023-04-25 NOTE — Progress Notes (Signed)
    OVERNIGHT PROGRESS REPORT  Notified by RN that patient is deceased as of 0225 hrs  Patient was DNR/CMO (comfort measures only) 2 RN verified.  Family was immediately not available to RN/ will be contacted by RN staff.     Chinita Greenland MSNA ACNPC-AG Acute Care Nurse Practitioner [facility]

## 2023-04-25 NOTE — Death Summary Note (Signed)
DEATH SUMMARY   Patient Details  Name: Madison [name] MRN: [id number] DOB: Feb 26, 1951 WUJ:[id number], Francis Dowse, MD Admission/Discharge Information   Admit Date:  March 02, 2023  Date of Death: Date of Death: Mar 30, 2023  Time of Death: Time of Death: 0225  Length of Stay: 16     Hospital Diagnoses: Principal Problem:   ESRD on hemodialysis [facility]) Active Problems:   Schizoaffective disorder, bipolar type (HCC)   Hip fracture, left (HCC)   Malnutrition of moderate degree   Hematochezia   Constipation   Gastroesophageal reflux disease   BRBPR (bright red blood per rectum)   Leukocytosis   Sinus tachycardia   Anemia due to chronic kidney disease, on chronic dialysis [facility])   72 y.o. female with past medical history of ESRD, schizoaffective disorder bipolar type, anxiety, neuropathy admitted on 03/02/23 with fall at facility with left hip fracture s/p surgical repair 6/6. Hospitalization complicated by issues with dialysis catheter, agitation and behavioral disturbance, and inability to sit in chair for dialysis, rectal bleeding likely secondary to stercoral colitis   Evolving sepsis and respiratory failure in context of new LLL airspace disease and effusion likely HCAP vs. Aspiration. Baseline FTT, multiple medical issues. Sisters at bedside at peace with this being EOL. Do not think further HD, thoracentesis, abx will meaningfully prolong her life. All in agreement to focus on comfort. Patient died on 2023/03/30 at 225        Consultations: renal, PCCM, palliative care, GI, ortho  The results of significant diagnostics from this hospitalization (including imaging, microbiology, ancillary and laboratory) are listed below for reference.   Significant Diagnostic Studies: DG CHEST PORT 1 VIEW  Result Date: 03/24/2023 CLINICAL DATA:  [id number].  Respiratory complication. EXAM: PORTABLE CHEST 1 VIEW COMPARISON:  Portable chest 03/13/2023 FINDINGS: 7:30 a.m. There has now a moderate-to-large, increased left  pleural effusion, with increased opacity over the left mid to lower lung fields which could be due to adjacent atelectasis or consolidation. A small right pleural effusion is again noted and is unchanged. The lungs are otherwise clear. There is mild cardiomegaly but no vascular distention. Aortic tortuosity and atherosclerosis with stable mediastinum. Osteopenia. No new osseous findings. Vascular stent medial left upper arm is again noted. IMPRESSION: 1. Moderate-to-large, increased left pleural effusion with increased opacity over the left mid to lower lung fields which could be due to atelectasis or consolidation. 2. Stable small right pleural effusion. Electronically Signed   By: Almira Bar M.D.   On: 03/24/2023 07:41   DG HIP UNILAT WITH PELVIS 2-3 VIEWS LEFT  Result Date: 03/14/2023 CLINICAL DATA:  Evaluate for hip fracture. EXAM: DG HIP (WITH OR WITHOUT PELVIS) 2-3V LEFT COMPARISON:  CT AP 03/12/2023 FINDINGS: Status post left hip arthroplasty. No signs of periprosthetic fracture or subluxation. Right hip is intact and located. No signs of pelvic fracture or diastasis. IMPRESSION: Status post left hip arthroplasty. No acute findings. Electronically Signed   By: Signa Kell M.D.   On: 03/14/2023 13:11   DG Abd 1 View  Result Date: 03/13/2023 CLINICAL DATA:  Pneumonia.  Constipation. EXAM: ABDOMEN - 1 VIEW COMPARISON:  Abdominal CT 03/12/2023. FINDINGS: 0620 hours. The bowel gas pattern is nonobstructive. There is mildly prominent stool in the distal colon including the rectum, slightly improved from recent CT. There are probable bowel anastomosis clips in the right mid abdomen as well as cholecystectomy clips. No suspicious abdominal calcifications. Thoracolumbar scoliosis and previous left hip arthroplasty noted. IMPRESSION: Mildly prominent stool in the distal colon, slightly  improved from recent CT. No evidence of bowel obstruction. Electronically Signed   By: Carey Bullocks M.D.   On:  03/13/2023 10:08   DG CHEST PORT 1 VIEW  Result Date: 03/13/2023 CLINICAL DATA:  Pneumonia. EXAM: PORTABLE CHEST 1 VIEW COMPARISON:  Radiographs 03/10/2023 and 03/21/2023. Abdominal CT 03/12/2023. FINDINGS: 0617 hours. The heart size and mediastinal contours are stable with mild cardiomegaly and aortic atherosclerosis. There are lower lung volumes with increasing patchy left basilar airspace disease. Small bilateral pleural effusions are present. No edema or pneumothorax. The bones appear unchanged. Telemetry leads overlie the chest. IMPRESSION: Lower lung volumes with increasing patchy left basilar airspace disease, suspicious for pneumonia. Small bilateral pleural effusions. Electronically Signed   By: Carey Bullocks M.D.   On: 03/13/2023 10:06   CT ABDOMEN PELVIS WO CONTRAST  Result Date: 03/12/2023 CLINICAL DATA:  Abdominal pain EXAM: CT ABDOMEN AND PELVIS WITHOUT CONTRAST TECHNIQUE: Multidetector CT imaging of the abdomen and pelvis was performed following the standard protocol without IV contrast. RADIATION DOSE REDUCTION: This exam was performed according to the departmental dose-optimization program which includes automated exposure control, adjustment of the mA and/or kV according to patient size and/or use of iterative reconstruction technique. COMPARISON:  12/26/2022 FINDINGS: Lower chest: Interval appearance of small bilateral pleural effusions, more so on the left side. Small patchy infiltrates are seen in the posterior lower lung fields suggesting atelectasis/pneumonia. Small pericardial effusion is present. There are scattered coronary artery calcifications. There are scattered calcifications in aorta. Hepatobiliary: Surgical clips are seen in gallbladder fossa. Pancreas: No focal abnormalities are seen. Spleen: Unremarkable. Adrenals/Urinary Tract: Adrenals are unremarkable. Right kidney is small in size. There is 2 mm calculus in the upper pole of right kidney. There are multiple smooth  marginated low-density lesions in both kidneys largest measuring 4 cm in size in the left kidney suggesting renal cysts. Ureters are unremarkable. Urinary bladder is not distended. Stomach/Bowel: Stomach is unremarkable. Small bowel loops are not dilated. The appendix is not seen. There is no pericecal inflammation. There is no significant wall thickening in colon. Large amount of stool is seen in rectum. Transverse diameter of rectum measures 8 cm. Rays wall thickening in rectum. Knees perirectal stranding. Vascular/Lymphatic: Scattered arterial calcifications are seen in aorta and its major branches. Reproductive: Unremarkable. Other: There is no ascites or pneumoperitoneum. Musculoskeletal: Severe degenerative changes are noted in lumbar spine, most prominent at L1-L2, L3-L4 and L4-L5 levels. No significant interval changes are noted. There is left hip arthroplasty. IMPRESSION: There is large amount of stool in rectum. There is wall thickening in rectum and perirectal stranding. Findings suggest fecal impaction and possible proctitis. There is no evidence of intestinal obstruction or pneumoperitoneum. There is no hydronephrosis. There are small new patchy infiltrates in both lower lung fields suggesting atelectasis/pneumonia. Small bilateral pleural effusions, more so on the left side. Small pericardial effusion. Coronary artery disease. Possible small 2 mm right renal stone. Multiple renal cysts. Severe lumbar spondylosis. Left hip arthroplasty. Aortic arteriosclerosis. Electronically Signed   By: Ernie Avena M.D.   On: 03/12/2023 11:53   DG CHEST PORT 1 VIEW  Result Date: 03/10/2023 CLINICAL DATA:  Leukocytosis EXAM: PORTABLE CHEST 1 VIEW COMPARISON:  03/19/2023 FINDINGS: Stable mild cardiomegaly. Aortic atherosclerosis. No focal airspace consolidation, pleural effusion, or pneumothorax. Skin fold overlies the left chest. Left upper extremity vascular stent. IMPRESSION: No active disease.  Electronically Signed   By: Duanne Guess D.O.   On: 03/10/2023 12:42   DG Pelvis Portable  Result Date: 03/07/2023 CLINICAL DATA:  Pain.  Left hip arthroplasty 03/17/2023. EXAM: PORTABLE PELVIS 1-2 VIEWS COMPARISON:  Radiograph 03/20/2023 FINDINGS: Left hip arthroplasty in expected alignment. No periprosthetic lucency or fracture. Decreasing soft tissue edema in the subcutaneous tissues. Bony pelvis is intact. Right hip is normally located. IMPRESSION: No acute findings.  Left hip arthroplasty without complication. Electronically Signed   By: Narda Rutherford M.D.   On: 03/07/2023 16:08   DG Knee Left Port  Result Date: 03/04/2023 CLINICAL DATA:  Pain. EXAM: PORTABLE LEFT KNEE - 1-2 VIEW COMPARISON:  10/29/2022 FINDINGS: Lateral view limited by positioning. Subjective bony under mineralization. No evidence of acute fracture or dislocation. Grossly stable degenerative changes from prior exam. No large knee joint effusion, although assessment limited by rotation. IMPRESSION: 1. No evidence of acute osseous abnormality. 2. Stable degenerative change. Electronically Signed   By: Narda Rutherford M.D.   On: 03/04/2023 14:54   IR AV DIALY SHUNT INTRO NEEDLE/INTRACATH INITIAL W/PTA/IMG LEFT  Result Date: 03/04/2023 INDICATION: 72 year old female with high pressure on hemodialysis via left upper extremity graft, referred for fistulagram. EXAM: ULTRASOUND-GUIDED ACCESS LEFT UPPER EXTREMITY HEMODIALYSIS FISTULA BALLOON ANGIOPLASTY OF OUTFLOW STENOSIS MEDICATIONS: None. ANESTHESIA/SEDATION: Moderate (conscious) sedation was not employed during this procedure. A total of Versed 0 mg and Fentanyl 25 mcg was administered intravenously by the radiology nurse. Total intra-service moderate Sedation Time: 0 minutes. The patient's level of consciousness and vital signs were monitored continuously by radiology nursing throughout the procedure under my direct supervision. FLUOROSCOPY: Radiation Exposure Index (as  provided by the fluoroscopic device): 10 mGy Kerma COMPLICATIONS: None PROCEDURE: Informed written consent was obtained from the patient after a thorough discussion of the procedural risks, benefits and alternatives. All questions were addressed. Maximal Sterile Barrier Technique was utilized including caps, mask, sterile gowns, sterile gloves, sterile drape, hand hygiene and skin antiseptic. A timeout was performed prior to the initiation of the procedure. The procedure, risks, benefits, and alternatives were explained to the patient and the patient's family. Questions regarding the procedure were encouraged and answered. The patient understands and consents to the procedure. Ultrasound survey was performed with images stored and sent to PACs. The left upper extremity was prepped and draped in the usual sterile fashion. Ultrasound guidance was used to access the left upper extremity graft with a micropuncture kit. Once the 3 French sheath was within the graft, left upper extremity fistulogram was performed. Images were carried out to the central vasculature, and reflux images at the anastomosis were performed. After the initial images through the central vasculature, we elected to perform balloon angioplasty of outflow stenosis. Bentson wire was placed. A 7 French sheath was placed into the fistula. 12 mm balloon angioplasty of the axillary vein was performed. While the balloon was inflated reflux images of the arterial anastomosis performed. Repeat images performed of the axillary vein revealed persistent stenosis. 14 mm balloon angioplasty was then performed. Repeat images demonstrated no residual stenosis. All catheters and wires were removed. A Z-stitch was performed at the site of the puncture for assistance with hemostasis and the catheter was removed. No angioplasty was performed. Patient tolerated the procedure well and remained hemodynamically stable throughout. No complications were encountered and no  significant blood loss was encountered. FINDINGS: Images demonstrate high-grade stenosis of the axillary vein, as the source of high central venous pressures on hemodialysis. No residual stenosis after 14 mm balloon angioplasty. Excellent thrill on completion. Reflux images at the arterial anastomosis demonstrate no evidence of significant stenosis.  IMPRESSION: Status post ultrasound guided access left upper extremity fistula for balloon angioplasty of the outflow stenosis at the axillary vein, with no residual on completion and excellent thrill maintained. Signed, Yvone Neu. Miachel Roux, RPVI [facility] Radiology ACCESS: This access remains amenable to future percutaneous interventions as clinically indicated. Electronically Signed   By: Gilmer Mor D.O.   On: 03/04/2023 11:34   IR US Guide Vasc Access Left  Result Date: 03/04/2023 INDICATION: 72 year old female with high pressure on hemodialysis via left upper extremity graft, referred for fistulagram. EXAM: ULTRASOUND-GUIDED ACCESS LEFT UPPER EXTREMITY HEMODIALYSIS FISTULA BALLOON ANGIOPLASTY OF OUTFLOW STENOSIS MEDICATIONS: None. ANESTHESIA/SEDATION: Moderate (conscious) sedation was not employed during this procedure. A total of Versed 0 mg and Fentanyl 25 mcg was administered intravenously by the radiology nurse. Total intra-service moderate Sedation Time: 0 minutes. The patient's level of consciousness and vital signs were monitored continuously by radiology nursing throughout the procedure under my direct supervision. FLUOROSCOPY: Radiation Exposure Index (as provided by the fluoroscopic device): 10 mGy Kerma COMPLICATIONS: None PROCEDURE: Informed written consent was obtained from the patient after a thorough discussion of the procedural risks, benefits and alternatives. All questions were addressed. Maximal Sterile Barrier Technique was utilized including caps, mask, sterile gowns, sterile gloves,  sterile drape, hand hygiene and skin antiseptic. A timeout was performed prior to the initiation of the procedure. The procedure, risks, benefits, and alternatives were explained to the patient and the patient's family. Questions regarding the procedure were encouraged and answered. The patient understands and consents to the procedure. Ultrasound survey was performed with images stored and sent to PACs. The left upper extremity was prepped and draped in the usual sterile fashion. Ultrasound guidance was used to access the left upper extremity graft with a micropuncture kit. Once the 3 French sheath was within the graft, left upper extremity fistulogram was performed. Images were carried out to the central vasculature, and reflux images at the anastomosis were performed. After the initial images through the central vasculature, we elected to perform balloon angioplasty of outflow stenosis. Bentson wire was placed. A 7 French sheath was placed into the fistula. 12 mm balloon angioplasty of the axillary vein was performed. While the balloon was inflated reflux images of the arterial anastomosis performed. Repeat images performed of the axillary vein revealed persistent stenosis. 14 mm balloon angioplasty was then performed. Repeat images demonstrated no residual stenosis. All catheters and wires were removed. A Z-stitch was performed at the site of the puncture for assistance with hemostasis and the catheter was removed. No angioplasty was performed. Patient tolerated the procedure well and remained hemodynamically stable throughout. No complications were encountered and no significant blood loss was encountered. FINDINGS: Images demonstrate high-grade stenosis of the axillary vein, as the source of high central venous pressures on hemodialysis. No residual stenosis after 14 mm balloon angioplasty. Excellent thrill on completion. Reflux images at the arterial anastomosis demonstrate no evidence of significant  stenosis. IMPRESSION: Status post ultrasound guided access left upper extremity fistula for balloon angioplasty of the outflow stenosis at the axillary vein, with no residual on completion and excellent thrill maintained. Signed, Yvone Neu. Miachel Roux, RPVI [facility] Radiology ACCESS: This access remains amenable to future percutaneous interventions as clinically indicated. Electronically Signed   By: Gilmer Mor D.O.   On: 03/04/2023 11:34   DG Ankle Complete Left  Result Date: 03/02/2023 CLINICAL DATA:  Left ankle pain. EXAM: LEFT ANKLE COMPLETE -  3+ VIEW COMPARISON:  None Available. FINDINGS: There is diffuse decreased bone mineralization. The ankle mortise is symmetric and intact. There is subchondral lucency within the medial aspect of the talar dome measuring up to 8 mm in transverse dimension and 4 mm in craniocaudal depth. Mild posterior tibiotalar joint space narrowing. No acute fracture or dislocation, although evaluation is limited by the patient's overlying (nonslip) socks with dense overlying patterns. IMPRESSION: 1. Subchondral lucency within the medial aspect of the talar dome consistent with a degenerative subchondral cyst. 2. No acute fracture is seen. Electronically Signed   By: Neita Garnet M.D.   On: 03/02/2023 13:10   DG Hip Port Unilat With Pelvis 1V Left  Result Date: 03/10/2023 CLINICAL DATA:  Postop left hip replacement. EXAM: DG HIP (WITH OR WITHOUT PELVIS) 1V PORT LEFT COMPARISON:  02/25/2023 FINDINGS: Status post left hip hemiarthroplasty. No evidence for immediate hardware complication. Gas in the overlying soft tissues is compatible with the immediate postoperative state. IMPRESSION: Status post left hip hemiarthroplasty without evidence for immediate hardware complication. Electronically Signed   By: Kennith Center M.D.   On: 03/09/2023 13:25   Chest Portable 1 View  Result Date: 03/18/2023 CLINICAL DATA:  Preop hip  fracture EXAM: PORTABLE CHEST 1 VIEW COMPARISON:  Chest x-ray 02/25/2023 FINDINGS: Heart is enlarged. There central pulmonary vascular congestion. There is no focal lung infiltrate, pleural effusion or pneumothorax. Vascular stent is seen in the left arm. There surgical clips in the right abdomen. No acute fractures. IMPRESSION: Cardiomegaly with central pulmonary vascular congestion. Electronically Signed   By: Darliss Cheney M.D.   On: 03/24/2023 21:11    Microbiology: Recent Results (from the past 240 hour(s))  Culture, blood (x 2)     Status: None (Preliminary result)   Collection Time: 03/24/23  9:26 AM   Specimen: BLOOD  Result Value Ref Range Status   Specimen Description BLOOD SITE NOT SPECIFIED  Final   Special Requests   Final    BOTTLES DRAWN AEROBIC ONLY Blood Culture adequate volume   Culture   Final    NO GROWTH 1 DAY Performed at [facility], [address] [place], [place] [address]    Report Status PENDING  Incomplete  Culture, blood (x 2)     Status: None (Preliminary result)   Collection Time: 03/24/23  9:28 AM   Specimen: BLOOD  Result Value Ref Range Status   Specimen Description BLOOD SITE NOT SPECIFIED  Final   Special Requests   Final    BOTTLES DRAWN AEROBIC ONLY Blood Culture results may not be optimal due to an inadequate volume of blood received in culture bottles   Culture   Final    NO GROWTH 1 DAY Performed at [facility], [address] [place], [place] [address]    Report Status PENDING  Incomplete    Time spent: 45 minutes  Signed: Joseph Art, DO 04/22/2023

## 2023-04-25 NOTE — Progress Notes (Signed)
Late Note Entry  Contacted [facility] to advise clinic that pt passed away. Death summary faxed to clinic per their request.   Olivia Canter Renal Navigator [phone]

## 2023-04-25 DEATH — deceased

## 2023-05-24 ENCOUNTER — Ambulatory Visit: Payer: Medicare Other | Admitting: Internal Medicine
